# Patient Record
Sex: Male | Born: 1965 | Race: Black or African American | Hispanic: No | Marital: Single | Smoking: Never smoker
Health system: Southern US, Community
[De-identification: ages and names within clinical notes are randomized; demographics above are authoritative.]

## PROBLEM LIST (undated history)

## (undated) ENCOUNTER — Emergency Department (HOSPITAL_BASED_OUTPATIENT_CLINIC_OR_DEPARTMENT_OTHER): Admission: EM | Payer: Medicaid Other

## (undated) DIAGNOSIS — C801 Malignant (primary) neoplasm, unspecified: Secondary | ICD-10-CM

## (undated) DIAGNOSIS — F209 Schizophrenia, unspecified: Secondary | ICD-10-CM

---

## 2015-01-04 ENCOUNTER — Emergency Department (HOSPITAL_COMMUNITY)
Admission: EM | Admit: 2015-01-04 | Discharge: 2015-01-04 | Disposition: A | Discharge status: Home / Self Care | Payer: Medicaid Other | Attending: Emergency Medicine | Admitting: Emergency Medicine

## 2015-01-04 ENCOUNTER — Encounter (HOSPITAL_COMMUNITY): Payer: Self-pay | Admitting: *Deleted

## 2015-01-04 DIAGNOSIS — Z0000 Encounter for general adult medical examination without abnormal findings: Secondary | ICD-10-CM | POA: Diagnosis present

## 2015-01-04 DIAGNOSIS — Z711 Person with feared health complaint in whom no diagnosis is made: Secondary | ICD-10-CM | POA: Insufficient documentation

## 2015-01-04 DIAGNOSIS — F209 Schizophrenia, unspecified: Secondary | ICD-10-CM | POA: Insufficient documentation

## 2015-01-04 DIAGNOSIS — Z79899 Other long term (current) drug therapy: Secondary | ICD-10-CM | POA: Insufficient documentation

## 2015-01-04 NOTE — ED Notes (Signed)
The pot states he wants a physical no other info given slow walking talking  Difficult to understand

## 2015-01-04 NOTE — Discharge Instructions (Signed)
Please follow up with your doctor for further evaluation and management of your health and refill your medication   Emergency Department Resource Guide 1) Find a Doctor and Pay Out of Pocket Although you won't have to find out who is covered by your insurance plan, it is a good idea to ask around and get recommendations. You will then need to call the office and see if the doctor you have chosen will accept you as a new patient and what types of options they offer for patients who are self-pay. Some doctors offer discounts or will set up payment plans for their patients who do not have insurance, but you will need to ask so you aren't surprised when you get to your appointment.  2) Contact Your Local Health Department Not all health departments have doctors that can see patients for sick visits, but many do, so it is worth a call to see if yours does. If you don't know where your local health department is, you can check in your phone book. The CDC also has a tool to help you locate your state's health department, and many state websites also have listings of all of their local health departments.  3) Find a [facility] If your illness is not likely to be very severe or complicated, you may want to try a walk in clinic. These are popping up all over the country in pharmacies, drugstores, and shopping centers. They're usually staffed by nurse practitioners or physician assistants that have been trained to treat common illnesses and complaints. They're usually fairly quick and inexpensive. However, if you have serious medical issues or chronic medical problems, these are probably not your best option.  No Primary Care Doctor: - Call Health Connect at  [phone] - they can help you locate a primary care doctor that  accepts your insurance, provides certain services, etc. - Physician Referral Service- [phone]  Chronic Pain Problems: Organization         Address  Phone   Notes  [facility]  [phone] Patients need to be referred by their primary care doctor.   Medication Assistance: Organization         Address  Phone   Notes  [facility] [address] [place], [place] [address] [phone] --Must be a resident of [facility] -- Must have NO insurance coverage whatsoever (no Medicaid/ Medicare, etc.) -- The pt. MUST have a primary care doctor that directs their care regularly and follows them in the community   MedAssist  [phone]   [employer]  [phone]    Agencies that provide inexpensive medical care: Organization         Address  Phone   Notes  [place]  [phone]   [name]    [phone]   [facility] [place], [place] [address] [phone]   [place] [address] [place] [phone]   Planned Parenthood    [phone]   [facility]    [phone]   [place] and [place] [address], [place] Phone:  [phone], Fax:  [phone] Hours of Operation:  9 am - 6 pm, M-F.  Also accepts Medicaid/Medicare and self-pay.  [facility] for [place] [address], [address],  Phone: [phone], Fax: [phone]. Hours  of Operation:  8:30 am - 5:30 pm, M-F.  Also accepts Medicaid and self-pay.  [facility] [place] [address], [place] Phone: [phone]   [place], [place], [place] [phone], Ext. [phone] Mondays & Thursdays: 7-9 AM.  First 15 patients are seen on a first come, first serve basis.    Ewa Gentry Providers:  Organization         Address  Phone   Notes  [facility] [address], [place] [phone] Also accepts self-pay patients.  [facility] [address], [place]  [phone]   [place], [address], [place] [phone]   [facility] [address], [place] [phone]   Lucianne Lei [address], [place]   [phone] Only accepts [place] Access [place] patients after they have their name applied to their card.   Self-Pay (no insurance) in [facility]:  Organization         Address  Phone   Notes  Sickle Cell Patients, [facility] [place] [phone]   [facility] [place] [phone]   [name] [place]  [place], [place], [place] [phone]   [facility]/Dr. Osei-Bonsu  [address], [place] or [address], [place] [phone] Phone number for both [place] and [place] locations is the same.  [facility] and [facility] [address] [place] [phone]   [facility] [address] [place] or [address] [phone]   [facility] [address], [place] [phone], phone; [phone], fax Sees patients 1st and 3rd Saturday of every month.  Must not qualify for public or private insurance (i.e. Medicaid, Medicare, [place] Health Choice, Veterans' Benefits)  Household income should be no more than 200% of the poverty level The clinic cannot treat you if you are pregnant or think you are pregnant  Sexually transmitted diseases are not treated at the clinic.    Dental Care: Organization         Address  Phone  Notes  [facility] Department of [facility] [phone] Accepts children up to age 25 who are enrolled in [place] or [place]; pregnant women with a Medicaid card; and children who have applied for Medicaid  or [place] Health Choice, but were declined, whose parents can pay a reduced fee at time of service.  [facility] Department of [facility]  [address], [place] [phone] Accepts children up to age 71 who are enrolled in [place] or [place]; pregnant women with a Medicaid card; and children who have applied for Medicaid or [place] Health Choice, but were declined, whose parents can pay a reduced fee at time of service.  [place] Adult Dental Access PROGRAM  [place] [phone] Patients are seen by appointment only. Walk-ins are not accepted. [place] will see patients 52 years of age and older. Monday - Tuesday (8am-5pm) Most Wednesdays (8:30-5pm) $30 per visit, cash only  [facility] Adult Dental Access PROGRAM  [address], [place] [phone] Patients are  seen by appointment only. Walk-ins are not accepted. [place] will see patients 30 years of age and older. One Wednesday Evening (Monthly: Volunteer Based).  $30 per visit, cash only  [place]  [phone] for adults; Children under age 61, call Graduate Pediatric Dentistry at [phone]. Children aged 81-14, please call [phone] to request a pediatric application.  Dental services are provided in all areas of dental care including fillings, crowns and bridges, complete and partial dentures, implants, gum treatment, root canals, and extractions. Preventive care is also provided. Treatment is provided to both adults and children. Patients are selected via a lottery and there is often a waiting list.   [facility] [address] [name]  [phone] [email or web address]   [facility] [address] [place], [place] [phone], Ext. [phone] Second and Fourth Thursday of each month, opens at 6:30 AM; Clinic ends at 9 AM.  Patients are seen on a first-come first-served basis, and a limited number are seen  during each clinic.   [facility]  [address] Hillard Danker [place], [place] [phone]   Eligibility Requirements You must have lived in [place], [place], or [place] counties for at least the last three months.   You cannot be eligible for state or federal sponsored [employer], including [employer], [place], or [employer].   You generally cannot be eligible for healthcare insurance through your employer.    How to apply: Eligibility screenings are held every Tuesday and Wednesday afternoon from 1:00 pm until 4:00 pm. You do not need an appointment for the interview!  [facility] [address] [place], [place]   [place]  [place] Department  [place]  [phone]    Behavioral Health Resources in the Community: Intensive Outpatient Programs Organization         Address  Phone  Notes  [place]. [address], [place], [place] [phone]   [facility] [address], [place], [place]   ADS: Alcohol & Drug Svcs [address] [place], [place]   [place] [address]  [place], [place] or [phone]   Substance Abuse Resources Organization         Address  Phone  Notes  Alcohol and Drug Services  [phone]   [place]  [phone]   [place]   Chinita Pester  [phone]   Residential & Outpatient Substance Abuse Program  [phone]   Psychological Services Organization         Address  Phone  Notes  [facility]  [place]  [phone]   [place] [address] [place] or [phone]    Mobile Crisis Teams Organization         Address  Phone  Notes  [facility],  Mobile Crisis Care Unit  [phone]   [facility]  [address] [place], [place]   [place] [address], [place] [phone]    Self-Help/Support Groups Organization         Address  Phone             Notes  [place]. of  - variety of support groups  [place] Call for  more information  Narcotics Anonymous (NA), [facility] [address], [employer] [place]  2 meetings at this location   Residential Facilities manager         Address  Phone  Notes  [facility] [place],    [place]  [phone]   [facility]  [address] [place] [phone], [place], [place]   Irena [place], [place] [phone] Admissions: 8am-3pm M-F  Incentives Substance [place] [address],    [place], [place] [phone]   [facility] [address] [place], [place], [place]   [facility] [address],  [place], [place]   [facility] Programs - Intensive Outpatient [address] Kristeen [address], [place], [place]   [facility] [place].,  [place], [place] [phone] or [phone]   [facility] (RTS) [address], [place], [place] Accepts Medicaid  [place] [address]  [place] [phone] Substance Abuse/Addiction Treatment   [facility] Organization         Address  Phone  Notes  [facility]  [phone]   Domenic Schwab, PhD [address] Arlis Porta [place], [place]   [phone] or [phone]   [place], [place] [phone]   [facility] [address] [place], [place] [phone] Insurance/Medicaid/sponsorship through [facility] and Families [address],  Ste [place]                                    [place], [place] [phone] [place] [address], [place] [phone]    Dr. Adele Schilder  [phone]   [facility] Dept. 1) [address], [place] 2) [place] 3)  [place] [address], [place] [phone]  [phone]   [place] [phone] or [phone] (After Hours)

## 2015-01-04 NOTE — ED Provider Notes (Signed)
CSN: [id number]     Arrival date & time 01/04/15  1435 History   This chart was scribed for Matthew Moras, PA-C working with Orlie Dakin, MD by Randa Evens, ED Scribe. This patient was seen in room TR05C/TR05C and the patient's care was started at [facility] PM.      Chief Complaint  Patient presents with  . he weants a physical    The history is provided by the patient. No language interpreter was used.   HPI Comments: Matthew [name] is a 49 y.o. male who presents to the Emergency Department for physical exam. Pt doesn't report an complaints today. Pt does report PMHx of schizophrenia, he states that he was previously prescribed risperdal. He states that he hasn't been complaint with psychiatric medications for several months. Pt is requesting risperdal refill. Pt denies any changes in mental status. Pt denies fever, SI, HI, depression, hallucinations, CP, SOB, HA, abdominal pain, n/v/d, urinary changes, back pain or vision changes. Pt lives at home by himself. Denies self medicating with alcohol or drugs.    History reviewed. No pertinent past medical history. History reviewed. No pertinent past surgical history. No family history on file. History  Substance Use Topics  . Smoking status: Never Smoker   . Smokeless tobacco: Not on file  . Alcohol Use: No    Review of Systems  Constitutional: Negative for fever.  Respiratory: Negative for shortness of breath.   Cardiovascular: Negative for chest pain.  Gastrointestinal: Negative for nausea, vomiting, abdominal pain and diarrhea.  Genitourinary: Negative.   Musculoskeletal: Negative for back pain.  Neurological: Negative for headaches.  Psychiatric/Behavioral: Negative for suicidal ideas, hallucinations, sleep disturbance and dysphoric mood.     Allergies  Review of patient's allergies indicates no known allergies.  Home Medications   Prior to Admission medications   Not on File   BP 112/82 mmHg  Pulse 91  Temp(Src) 98.2 F  (36.8 C)  Resp 18  Ht '5\' 9"'$  (1.753 m)  Wt 121 lb (54.885 kg)  BMI 17.86 kg/m2  SpO2 99%   Physical Exam  Constitutional: He is oriented to person, place, and time. He appears well-developed and well-nourished. No distress.  HENT:  Head: Normocephalic and atraumatic.  Mouth/Throat: Oropharynx is clear and moist.  Eyes: Conjunctivae and EOM are normal. Pupils are equal, round, and reactive to light.  Neck: Neck supple. No tracheal deviation present.  Cardiovascular: Normal rate, regular rhythm and normal heart sounds.   Pulmonary/Chest: Effort normal and breath sounds normal. No respiratory distress. He has no wheezes. He has no rales.  Abdominal: Soft.  Musculoskeletal: Normal range of motion.  Neurological: He is alert and oriented to person, place, and time. GCS eye subscore is 4. GCS verbal subscore is 5. GCS motor subscore is 6.  Skin: Skin is warm and dry.  Psychiatric: His affect is blunt. His speech is delayed. He is slowed. Thought content is not paranoid. Cognition and memory are normal. He expresses no homicidal and no suicidal ideation.  Nursing note and vitals reviewed.   ED Course  Procedures (including critical care time) DIAGNOSTIC STUDIES: Oxygen Saturation is 99% on RA, normal by my interpretation.    COORDINATION OF CARE: 3:43 PM-Discussed treatment plan with pt at bedside and pt agreed to plan.   3:47 PM Worried well, no acute emergent condition identified. Appears to be baseline with his psychiatric status.  No physical pain. NO SI/HI/Hallucination. Recommend f/u with his pcp for psychiatric medication refill.  Return precaution discussed.  Labs Review Labs Reviewed - No data to display  Imaging Review No results found.   EKG Interpretation None      MDM   Final diagnoses:  Worried well   BP 112/82 mmHg  Pulse 91  Temp(Src) 98.2 F (36.8 C)  Resp 18  Ht '5\' 9"'$  (1.753 m)  Wt 121 lb (54.885 kg)  BMI 17.86 kg/m2  SpO2 99%   I personally  performed the services described in this documentation, which was scribed in my presence. The recorded information has been reviewed and is accurate.       Matthew Moras, PA-C 01/04/15 [place], PA-C 01/04/15 [place], MD 01/04/15 [phone]

## 2016-03-26 ENCOUNTER — Emergency Department (HOSPITAL_COMMUNITY)
Admission: EM | Admit: 2016-03-26 | Discharge: 2016-03-26 | Disposition: A | Discharge status: Home / Self Care | Payer: Medicaid Other | Attending: Emergency Medicine | Admitting: Emergency Medicine

## 2016-03-26 ENCOUNTER — Encounter (HOSPITAL_COMMUNITY): Payer: Self-pay | Admitting: Emergency Medicine

## 2016-03-26 DIAGNOSIS — R066 Hiccough: Secondary | ICD-10-CM

## 2016-03-26 MED ORDER — CHLORPROMAZINE HCL 25 MG PO TABS
25.0000 mg | ORAL_TABLET | Freq: Once | ORAL | Status: AC
  Administered 2016-03-26: 25 mg via ORAL
  Filled 2016-03-26: qty 1

## 2016-03-26 NOTE — ED Triage Notes (Signed)
Pt in EMS, reports hiccups. Wants his hiccups fixed.

## 2016-03-26 NOTE — ED Provider Notes (Signed)
[facility] DEPT Provider Note   CSN: [id number] Arrival date & time: 03/26/16  7829     History   Chief Complaint Chief Complaint  Patient presents with  . Hiccups    HPI Matthew [name] is a 50 y.o. male.  HPI   50 year old male presents today with complaints of hiccups. Patient reports that last night he started developing hiccups that are persistent, and will not go away. Patient has not tried any medications at home, reports that he had similar symptoms in the past. Patient is a poor historian. He does not take any medications, denies any drug or alcohol use.   History reviewed. No pertinent past medical history.  There are no active problems to display for this patient.   History reviewed. No pertinent surgical history.     Home Medications    Prior to Admission medications   Not on File    Family History No family history on file.  Social History Social History  Substance Use Topics  . Smoking status: Never Smoker  . Smokeless tobacco: Not on file  . Alcohol use No     Allergies   Review of patient's allergies indicates no known allergies.   Review of Systems Review of Systems  All other systems reviewed and are negative.   Physical Exam Updated Vital Signs BP 137/84 (BP Location: Right Arm)   Pulse 74   Temp 98.2 F (36.8 C) (Oral)   Resp 16   SpO2 99%   Physical Exam  Constitutional: He is oriented to person, place, and time. He appears well-developed and well-nourished.  HENT:  Head: Normocephalic and atraumatic.  Mouth/Throat: Uvula is midline and oropharynx is clear and moist. No oropharyngeal exudate, posterior oropharyngeal edema, posterior oropharyngeal erythema or tonsillar abscesses.  Eyes: Conjunctivae are normal. Pupils are equal, round, and reactive to light. Right eye exhibits no discharge. Left eye exhibits no discharge. No scleral icterus.  Neck: Normal range of motion. No JVD present. No tracheal deviation present.    Cardiovascular: Normal rate, regular rhythm and normal heart sounds.   Pulmonary/Chest: Effort normal and breath sounds normal. No stridor. No respiratory distress. He has no wheezes. He has no rales. He exhibits no tenderness.  Abdominal: Soft. He exhibits no distension. There is no tenderness.  Neurological: He is alert and oriented to person, place, and time. Coordination normal.  Psychiatric: He has a normal mood and affect. His behavior is normal. Judgment and thought content normal.  Nursing note and vitals reviewed.    ED Treatments / Results  Labs (all labs ordered are listed, but only abnormal results are displayed) Labs Reviewed - No data to display  EKG  EKG Interpretation None       Radiology No results found.  Procedures Procedures (including critical care time)  Medications Ordered in ED Medications  chlorproMAZINE (THORAZINE) tablet 25 mg (25 mg Oral Given 03/26/16 0753)     Initial Impression / Assessment and Plan / ED Course  I have reviewed the triage vital signs and the nursing notes.  Pertinent labs & imaging results that were available during my care of the patient were reviewed by me and considered in my medical decision making (see chart for details).  Clinical Course    Final Clinical Impressions(s) / ED Diagnoses   Final diagnoses:  Hiccups   Labs:  Imaging:  Consults:  Therapeutics:Thorazine  Discharge Meds:   Assessment/Plan:  49 year old male presents today with hiccups. Patient denies any chest pain, shortness of breath,  abdominal pain, or any other concerning signs or symptoms. Patient was given Cold water to sip and gargle which resolved his symptoms temporarily. After approximately 15 minutes symptoms return, they were not relieved with cold water again. He was given Thorazine here in the ED, no resolution of symptoms. Patient has no significant cardiac history, no other complaints other than the hiccups, no need for further  evaluation and management here at this time. Patient will be instructed to continue sipping cold water, follow up with primary care if symptoms persist, return to emergency room immediately if they worsen. He verbalizes understanding and agreement to today's plan had no further questions or concerns at time of discharge. Digits mother was at bedside who also verbalized understanding and agreement today's plan.     New Prescriptions There are no discharge medications for this patient.    Okey Regal, PA-C 03/26/16 [place], MD 03/27/16 [phone]

## 2016-03-26 NOTE — Discharge Instructions (Signed)
Please continue sipping cold water as needed for hiccups, follow-up with primary care for reevaluation of symptoms persist, return to the emergency room if they worsen.

## 2016-03-29 ENCOUNTER — Encounter (HOSPITAL_COMMUNITY): Payer: Self-pay

## 2016-03-29 ENCOUNTER — Emergency Department (HOSPITAL_COMMUNITY): Payer: Medicaid Other

## 2016-03-29 ENCOUNTER — Emergency Department (HOSPITAL_COMMUNITY)
Admission: EM | Admit: 2016-03-29 | Discharge: 2016-03-29 | Disposition: A | Discharge status: Home / Self Care | Payer: Medicaid Other | Attending: Emergency Medicine | Admitting: Emergency Medicine

## 2016-03-29 DIAGNOSIS — R066 Hiccough: Secondary | ICD-10-CM

## 2016-03-29 DIAGNOSIS — R079 Chest pain, unspecified: Secondary | ICD-10-CM | POA: Insufficient documentation

## 2016-03-29 DIAGNOSIS — R112 Nausea with vomiting, unspecified: Secondary | ICD-10-CM | POA: Diagnosis not present

## 2016-03-29 LAB — CBC WITH DIFFERENTIAL/PLATELET
BASOS ABS: 0 10*3/uL (ref 0.0–0.1)
BASOS PCT: 0 %
Eosinophils Absolute: 0 10*3/uL (ref 0.0–0.7)
Eosinophils Relative: 0 %
HEMATOCRIT: 34.4 % — AB (ref 39.0–52.0)
HEMOGLOBIN: 11.7 g/dL — AB (ref 13.0–17.0)
LYMPHS ABS: 1.3 10*3/uL (ref 0.7–4.0)
LYMPHS PCT: 17 %
MCH: 29.5 pg (ref 26.0–34.0)
MCHC: 34 g/dL (ref 30.0–36.0)
MCV: 86.6 fL (ref 78.0–100.0)
MONO ABS: 0.8 10*3/uL (ref 0.1–1.0)
Monocytes Relative: 10 %
NEUTROS ABS: 5.6 10*3/uL (ref 1.7–7.7)
NEUTROS PCT: 73 %
Platelets: 307 10*3/uL (ref 150–400)
RBC: 3.97 MIL/uL — AB (ref 4.22–5.81)
RDW: 12.9 % (ref 11.5–15.5)
WBC: 7.7 10*3/uL (ref 4.0–10.5)

## 2016-03-29 LAB — COMPREHENSIVE METABOLIC PANEL
ALBUMIN: 4.1 g/dL (ref 3.5–5.0)
ALK PHOS: 63 U/L (ref 38–126)
ALT: 25 U/L (ref 17–63)
ANION GAP: 10 (ref 5–15)
AST: 16 U/L (ref 15–41)
BUN: 10 mg/dL (ref 6–20)
CHLORIDE: 102 mmol/L (ref 101–111)
CO2: 25 mmol/L (ref 22–32)
Calcium: 9.1 mg/dL (ref 8.9–10.3)
Creatinine, Ser: 0.88 mg/dL (ref 0.61–1.24)
GFR calc Af Amer: >60 mL/min (ref >60)
GFR calc non Af Amer: >60 mL/min (ref >60)
GLUCOSE: 109 mg/dL — AB (ref 65–99)
POTASSIUM: 3.7 mmol/L (ref 3.5–5.1)
SODIUM: 137 mmol/L (ref 135–145)
Total Bilirubin: 1.2 mg/dL (ref 0.3–1.2)
Total Protein: 6.9 g/dL (ref 6.5–8.1)

## 2016-03-29 LAB — LIPASE, BLOOD: Lipase: 17 U/L (ref 11–51)

## 2016-03-29 LAB — I-STAT TROPONIN, ED: Troponin i, poc: 0 ng/mL (ref 0.00–0.08)

## 2016-03-29 MED ORDER — KETOROLAC TROMETHAMINE 60 MG/2ML IM SOLN
60.0000 mg | Freq: Once | INTRAMUSCULAR | Status: AC
  Administered 2016-03-29: 60 mg via INTRAMUSCULAR
  Filled 2016-03-29: qty 2

## 2016-03-29 MED ORDER — CHLORPROMAZINE HCL 25 MG PO TABS
25.0000 mg | ORAL_TABLET | Freq: Once | ORAL | Status: AC
  Administered 2016-03-29: 25 mg via ORAL
  Filled 2016-03-29: qty 1

## 2016-03-29 MED ORDER — CHLORPROMAZINE HCL 25 MG PO TABS: 25.0000 mg | ORAL_TABLET | Freq: Three times a day (TID) | ORAL | 0 refills | Status: DC | PRN

## 2016-03-29 MED ORDER — GI COCKTAIL ~~LOC~~
30.0000 mL | Freq: Once | ORAL | Status: AC
  Administered 2016-03-29: 30 mL via ORAL
  Filled 2016-03-29: qty 30

## 2016-03-29 MED ORDER — NAPROXEN 500 MG PO TABS: 500.0000 mg | ORAL_TABLET | Freq: Two times a day (BID) | ORAL | 0 refills | Status: DC

## 2016-03-29 NOTE — ED Provider Notes (Signed)
[facility] DEPT Provider Note   CSN: [id number] Arrival date & time: 03/29/16  1056     History   Chief Complaint Chief Complaint  Patient presents with  . Chest Pain    HPI Matthew [name] is a 50 y.o. male.  Patient with history of schizophrenia on depot IM Invega -- presents with complaint of vomiting, mid chest pain, heartburn, and hiccups over the past 1 week. Mother at bedside and assists with history. Symptoms seem to come and go. Patient was seen in emergency department several days ago and treated with cold water gargles and 1 dose of Thorazine. He seemed to help, however patient has been unable to control his symptoms at home. He has a poor appetite due to vomiting. Chest pain is worse with hiccuping. No shortness of breath or lightheadedness. No fevers or diarrhea. No abdominal pain. No history of hypertension, high cholesterol, smoking, diabetes. Denies alcohol use. Chest pain has been present for greater than 24 hours. The onset of this condition was acute. The course is constant. Aggravating factors: none. Alleviating factors: none.        History reviewed. No pertinent past medical history.  There are no active problems to display for this patient.   History reviewed. No pertinent surgical history.     Home Medications    Prior to Admission medications   Not on File    Family History No family history on file.  Social History Social History  Substance Use Topics  . Smoking status: Never Smoker  . Smokeless tobacco: Not on file  . Alcohol use No     Allergies   Review of patient's allergies indicates no known allergies.   Review of Systems Review of Systems  Constitutional: Negative for diaphoresis and fever.  Eyes: Negative for redness.  Respiratory: Negative for cough and shortness of breath.   Cardiovascular: Positive for chest pain. Negative for palpitations and leg swelling.  Gastrointestinal: Positive for nausea and vomiting.  Negative for abdominal pain.       + hiccups  Genitourinary: Negative for dysuria.  Musculoskeletal: Negative for back pain and neck pain.  Skin: Negative for rash.  Neurological: Negative for syncope and light-headedness.  Psychiatric/Behavioral: The patient is not nervous/anxious.      Physical Exam Updated Vital Signs BP 104/77 (BP Location: Right Arm)   Pulse 92   Temp 97.5 F (36.4 C) (Oral)   Resp 18   Ht '5\' 9"'$  (1.753 m)   Wt 63.5 kg   SpO2 99%   BMI 20.67 kg/m   Physical Exam  Constitutional: He appears well-developed and well-nourished.  HENT:  Head: Normocephalic and atraumatic.  Mouth/Throat: Mucous membranes are normal. Mucous membranes are not dry.  No apparent thyromegaly.   Eyes: Conjunctivae are normal.  Neck: Trachea normal and normal range of motion. Neck supple. Normal carotid pulses and no JVD present. No muscular tenderness present. Carotid bruit is not present. No tracheal deviation present.  Cardiovascular: Normal rate, regular rhythm, S1 normal, S2 normal, normal heart sounds and intact distal pulses.  Exam reveals no distant heart sounds and no decreased pulses.   No murmur heard. Pulmonary/Chest: Effort normal and breath sounds normal. No respiratory distress. He has no wheezes. He exhibits no tenderness.  Abdominal: Soft. Normal aorta and bowel sounds are normal. There is no tenderness. There is no rebound and no guarding.  No hiccups during exam.   Musculoskeletal: He exhibits no edema.  Neurological: He is alert.  Skin: Skin is warm  and dry. He is not diaphoretic. No cyanosis. No pallor.  Psychiatric: He has a normal mood and affect.  Nursing note and vitals reviewed.    ED Treatments / Results  Labs (all labs ordered are listed, but only abnormal results are displayed) Labs Reviewed  COMPREHENSIVE METABOLIC PANEL - Abnormal; Notable for the following:       Result Value   Glucose, Bld 109 (*)    All other components within normal limits    CBC WITH DIFFERENTIAL/PLATELET - Abnormal; Notable for the following:    RBC 3.97 (*)    Hemoglobin 11.7 (*)    HCT 34.4 (*)    All other components within normal limits  LIPASE, BLOOD  I-STAT TROPOININ, ED    EKG  EKG Interpretation None       Radiology Dg Chest 2 View  Result Date: 03/29/2016 CLINICAL DATA:  Midsternal chest pain with cough and ache ups for 3 days. EXAM: CHEST  2 VIEW COMPARISON:  None. FINDINGS: No focal airspace consolidation. No pneumothorax or pulmonary edema. Tiny bilateral pleural effusions are evident. The cardiopericardial silhouette is within normal limits for size. The visualized bony structures of the thorax are intact. IMPRESSION: Tiny bilateral pleural effusions. Electronically Signed   By: Misty Stanley M.D.   On: 03/29/2016 14:19    Procedures Procedures (including critical care time)  Medications Ordered in ED Medications  gi cocktail (Maalox,Lidocaine,Donnatal) (30 mLs Oral Given 03/29/16 1347)  chlorproMAZINE (THORAZINE) tablet 25 mg (25 mg Oral Given 03/29/16 1502)  ketorolac (TORADOL) injection 60 mg (60 mg Intramuscular Given 03/29/16 1502)     Initial Impression / Assessment and Plan / ED Course  I have reviewed the triage vital signs and the nursing notes.  Pertinent labs & imaging results that were available during my care of the patient were reviewed by me and considered in my medical decision making (see chart for details).  Clinical Course   1:33 PM Patient seen and examined. Work-up initiated for persistent hiccups.   Vital signs reviewed and are as follows: BP 104/77 (BP Location: Right Arm)   Pulse 92   Temp 97.5 F (36.4 C) (Oral)   Resp 18   Ht '5\' 9"'$  (1.753 m)   Wt 63.5 kg   SpO2 99%   BMI 20.67 kg/m   3:51 PM Patient and mother informed of results. No indications for admission. GI cocktail was not effective.   Will give dose of PO Thorazine here with IM toradol for chest pain.   Will discharge with Thorazine  and naproxen.   Patient urged to return with worsening symptoms or other concerns. Encouraged PCP follow-up. Patient has appointment to establish PCP upcoming. Patient verbalized understanding and agrees with plan.   Final Clinical Impressions(s) / ED Diagnoses   Final diagnoses:  Chronic hiccoughs  Non-intractable vomiting with nausea, vomiting of unspecified type   Patient with persistent hiccups for approximately 4 days. He has CP, wrkup for ischemia is negative, feel this is likely related to chest wall musculoskeletal pain from frequent hiccups. He also has decreased appetite and vomiting, which is likely more regurgitation, secondary to hiccups. Patient appears well-hydrated. He is tolerating orals. Will discharge to home with oral medication to help control symptoms. PCP follow-up as above. Encouraged return with worsening or changing symptoms. Patient and mother verbalized understanding and agreed with plan.  New Prescriptions New Prescriptions   CHLORPROMAZINE (THORAZINE) 25 MG TABLET    Take 1 tablet (25 mg total) by mouth 3 (  three) times daily as needed for hiccoughs.   NAPROXEN (NAPROSYN) 500 MG TABLET    Take 1 tablet (500 mg total) by mouth 2 (two) times daily.     Carlisle Cater, PA-C 03/29/16 1100    Merrily Pew, MD 03/30/16 1045

## 2016-03-29 NOTE — Discharge Instructions (Signed)
Please read and follow all provided instructions.  Your diagnoses today include:  1. Chronic hiccoughs   2. Non-intractable vomiting with nausea, vomiting of unspecified type     Tests performed today include:  An EKG of your heart  A chest x-ray  Cardiac enzymes - a blood test for heart muscle damage  Blood counts and electrolytes  Vital signs. See below for your results today.   Medications prescribed:   Thorazine - medication to help control your hiccups.    Naproxen - anti-inflammatory pain medication  Do not exceed '500mg'$  naproxen every 12 hours, take with food  You have been prescribed an anti-inflammatory medication or NSAID. Take with food. Take smallest effective dose for the shortest duration needed for your pain. Stop taking if you experience stomach pain or vomiting.    Take any prescribed medications only as directed.  Follow-up instructions: Please follow-up with your primary care provider as soon as you can for further evaluation of your symptoms.   Return instructions:  SEEK IMMEDIATE MEDICAL ATTENTION IF:  You have severe chest pain, especially if the pain is crushing or pressure-like and spreads to the arms, back, neck, or jaw, or if you have sweating, nausea (feeling sick to your stomach), or shortness of breath. THIS IS AN EMERGENCY. Don't wait to see if the pain will go away. Get medical help at once. Call 911 or [phone] (operator). DO NOT drive yourself to the hospital.   Your chest pain gets worse and does not go away with rest.   You have an attack of chest pain lasting longer than usual, despite rest and treatment with the medications your caregiver has prescribed.   You wake from sleep with chest pain or shortness of breath.  You feel dizzy or faint.  You have chest pain not typical of your usual pain for which you originally saw your caregiver.   You have any other emergent concerns regarding your health.  Additional Information: Chest pain  comes from many different causes. Your caregiver has diagnosed you as having chest pain that is not specific for one problem, but does not require admission.  You are at low risk for an acute heart condition or other serious illness.   Your vital signs today were: BP 111/79    Pulse 78    Temp 97.5 F (36.4 C) (Oral)    Resp 13    Ht '5\' 9"'$  (1.753 m)    Wt 63.5 kg    SpO2 100%    BMI 20.67 kg/m  If your blood pressure (BP) was elevated above 135/85 this visit, please have this repeated by your doctor within one month. --------------

## 2016-03-29 NOTE — ED Triage Notes (Signed)
Pt having hiccups for more than 48 hours. Pt states "that when he hiccups he has chest pain and it feels like it is squeezing his chest."

## 2016-03-29 NOTE — ED Notes (Signed)
Patient transported to X-ray 

## 2017-06-06 ENCOUNTER — Emergency Department (HOSPITAL_COMMUNITY)
Admission: EM | Admit: 2017-06-06 | Discharge: 2017-06-06 | Disposition: A | Discharge status: Home / Self Care | Payer: Medicaid Other | Attending: Emergency Medicine | Admitting: Emergency Medicine

## 2017-06-06 ENCOUNTER — Encounter (HOSPITAL_COMMUNITY): Payer: Self-pay

## 2017-06-06 DIAGNOSIS — Z7982 Long term (current) use of aspirin: Secondary | ICD-10-CM | POA: Insufficient documentation

## 2017-06-06 DIAGNOSIS — Z79899 Other long term (current) drug therapy: Secondary | ICD-10-CM | POA: Diagnosis not present

## 2017-06-06 DIAGNOSIS — R112 Nausea with vomiting, unspecified: Secondary | ICD-10-CM | POA: Diagnosis present

## 2017-06-06 LAB — COMPREHENSIVE METABOLIC PANEL
ALBUMIN: 3.7 g/dL (ref 3.5–5.0)
ALK PHOS: 72 U/L (ref 38–126)
ALT: 11 U/L — AB (ref 17–63)
AST: 19 U/L (ref 15–41)
Anion gap: 8 (ref 5–15)
BUN: 8 mg/dL (ref 6–20)
CALCIUM: 9 mg/dL (ref 8.9–10.3)
CO2: 23 mmol/L (ref 22–32)
CREATININE: 1 mg/dL (ref 0.61–1.24)
Chloride: 106 mmol/L (ref 101–111)
GFR calc Af Amer: >60 mL/min (ref >60)
GFR calc non Af Amer: >60 mL/min (ref >60)
GLUCOSE: 117 mg/dL — AB (ref 65–99)
POTASSIUM: 3.6 mmol/L (ref 3.5–5.1)
Sodium: 137 mmol/L (ref 135–145)
Total Bilirubin: 0.8 mg/dL (ref 0.3–1.2)
Total Protein: 6.7 g/dL (ref 6.5–8.1)

## 2017-06-06 LAB — URINALYSIS, ROUTINE W REFLEX MICROSCOPIC
Glucose, UA: NEGATIVE mg/dL
Hgb urine dipstick: NEGATIVE
Ketones, ur: NEGATIVE mg/dL
LEUKOCYTES UA: NEGATIVE
Nitrite: NEGATIVE
PROTEIN: NEGATIVE mg/dL
SPECIFIC GRAVITY, URINE: 1.023 (ref 1.005–1.030)
pH: 5 (ref 5.0–8.0)

## 2017-06-06 LAB — I-STAT CG4 LACTIC ACID, ED
LACTIC ACID, VENOUS: 2.03 mmol/L — AB (ref 0.5–1.9)
LACTIC ACID, VENOUS: 2.56 mmol/L — AB (ref 0.5–1.9)

## 2017-06-06 LAB — CBC
HCT: 37.9 % — ABNORMAL LOW (ref 39.0–52.0)
HEMOGLOBIN: 12.3 g/dL — AB (ref 13.0–17.0)
MCH: 28.1 pg (ref 26.0–34.0)
MCHC: 32.5 g/dL (ref 30.0–36.0)
MCV: 86.5 fL (ref 78.0–100.0)
PLATELETS: 387 10*3/uL (ref 150–400)
RBC: 4.38 MIL/uL (ref 4.22–5.81)
RDW: 12.8 % (ref 11.5–15.5)
WBC: 6 10*3/uL (ref 4.0–10.5)

## 2017-06-06 LAB — LIPASE, BLOOD: Lipase: 22 U/L (ref 11–51)

## 2017-06-06 MED ORDER — SODIUM CHLORIDE 0.9 % IV BOLUS (SEPSIS)
1000.0000 mL | Freq: Once | INTRAVENOUS | Status: AC
  Administered 2017-06-06: 1000 mL via INTRAVENOUS

## 2017-06-06 MED ORDER — FAMOTIDINE 20 MG PO TABS: 20.0000 mg | ORAL_TABLET | Freq: Two times a day (BID) | ORAL | 0 refills | Status: DC

## 2017-06-06 MED ORDER — ONDANSETRON HCL 4 MG PO TABS: 4.0000 mg | ORAL_TABLET | Freq: Three times a day (TID) | ORAL | 0 refills | Status: DC | PRN

## 2017-06-06 NOTE — Discharge Instructions (Signed)
Your lab tests are ok today with no suggestion of your symptoms being related to an emergent or surgical problem.  I suspect you may have some gastritis or acid reflux which may be triggering these episodes.  Use the medicines prescribed if needed for continued symptoms.

## 2017-06-06 NOTE — ED Provider Notes (Signed)
[facility] EMERGENCY DEPARTMENT Provider Note   CSN: [id number] Arrival date & time: 06/06/17  1256     History   Chief Complaint Chief Complaint  Patient presents with  . Abdominal Pain  . Emesis    HPI Matthew [name] is a 51 y.o. male with a history of schizophrenia presenting with an approximate 3-week history of intermittent nausea and emesis.  He reports his symptoms occur at night, generally when supine while trying to sleep.  His last episode of vomiting occurred 2 nights ago.  He denies abdominal pain, diarrhea, dysuria, chest pain or shortness of breath.  He has had no recent illness such as URI type symptoms, fevers or chills.  He has had no recent medication changes.  He has had no medication for symptom relief.  Mother endorses he has had a good appetite, no complaints with eating or meals and no unexplained weight loss.   The history is provided by the patient and a parent.  Emesis   Pertinent negatives include no abdominal pain, no arthralgias, no chills, no fever and no headaches.    History reviewed. No pertinent past medical history.  There are no active problems to display for this patient.   History reviewed. No pertinent surgical history.     Home Medications    Prior to Admission medications   Medication Sig Start Date End Date Taking? Authorizing Provider  aspirin EC 81 MG tablet Take 81 mg daily as needed by mouth (pain/headache).   Yes [provider]  benztropine (COGENTIN) 1 MG tablet Take 1 mg at bedtime by mouth.   Yes [provider]  paliperidone (INVEGA SUSTENNA) 156 MG/ML SUSP injection Inject 156 mg every 30 (thirty) days into the muscle.   Yes [provider]  chlorproMAZINE (THORAZINE) 25 MG tablet Take 1 tablet (25 mg total) by mouth 3 (three) times daily as needed for hiccoughs. Patient not taking: Reported on 06/06/2017 03/29/16   Carlisle Cater, PA-C  famotidine (PEPCID) 20 MG tablet Take 1  tablet (20 mg total) 2 (two) times daily by mouth. 06/06/17   Evalee Jefferson, PA-C  naproxen (NAPROSYN) 500 MG tablet Take 1 tablet (500 mg total) by mouth 2 (two) times daily. Patient not taking: Reported on 06/06/2017 03/29/16   Carlisle Cater, PA-C  ondansetron (ZOFRAN) 4 MG tablet Take 1 tablet (4 mg total) every 8 (eight) hours as needed by mouth for nausea or vomiting. 06/06/17   Evalee Jefferson, PA-C    Family History No family history on file.  Social History Social History   Tobacco Use  . Smoking status: Never Smoker  Substance Use Topics  . Alcohol use: No  . Drug use: Not on file     Allergies   Patient has no known allergies.   Review of Systems Review of Systems  Constitutional: Negative for chills, diaphoresis and fever.  HENT: Negative for congestion and sore throat.   Eyes: Negative.   Respiratory: Negative for chest tightness and shortness of breath.   Cardiovascular: Negative for chest pain.  Gastrointestinal: Positive for nausea and vomiting. Negative for abdominal pain.  Genitourinary: Negative.   Musculoskeletal: Negative for arthralgias, joint swelling and neck pain.  Skin: Negative.  Negative for rash and wound.  Neurological: Negative for dizziness, weakness, light-headedness, numbness and headaches.  Psychiatric/Behavioral: Negative for agitation and sleep disturbance.     Physical Exam Updated Vital Signs BP 129/88   Pulse 96   Temp 99 F (37.2 C) (Oral)  Resp 16   SpO2 100%   Physical Exam  Constitutional: He appears well-developed and well-nourished.  HENT:  Head: Normocephalic and atraumatic.  Eyes: Conjunctivae are normal.  Neck: Normal range of motion.  Cardiovascular: Normal rate, regular rhythm, normal heart sounds and intact distal pulses.  Pulmonary/Chest: Effort normal and breath sounds normal. He has no wheezes.  Abdominal: Soft. Bowel sounds are normal. He exhibits no distension and no mass. There is no hepatosplenomegaly. There is  no tenderness. There is no rigidity, no guarding, no tenderness at McBurney's point and negative Murphy's sign.  Musculoskeletal: Normal range of motion.  Neurological: He is alert.  Skin: Skin is warm and dry.  Psychiatric:  Extremely flat affect (baseline per mother).  Nursing note and vitals reviewed.    ED Treatments / Results  Labs (all labs ordered are listed, but only abnormal results are displayed) Labs Reviewed  COMPREHENSIVE METABOLIC PANEL - Abnormal; Notable for the following components:      Result Value   Glucose, Bld 117 (*)    ALT 11 (*)    All other components within normal limits  CBC - Abnormal; Notable for the following components:   Hemoglobin 12.3 (*)    HCT 37.9 (*)    All other components within normal limits  URINALYSIS, ROUTINE W REFLEX MICROSCOPIC - Abnormal; Notable for the following components:   Color, Urine AMBER (*)    Bilirubin Urine SMALL (*)    All other components within normal limits  I-STAT CG4 LACTIC ACID, ED - Abnormal; Notable for the following components:   Lactic Acid, Venous 2.03 (*)    All other components within normal limits  I-STAT CG4 LACTIC ACID, ED - Abnormal; Notable for the following components:   Lactic Acid, Venous 2.56 (*)    All other components within normal limits  LIPASE, BLOOD    EKG  EKG Interpretation None       Radiology No results found.  Procedures Procedures (including critical care time)  Medications Ordered in ED Medications  sodium chloride 0.9 % bolus 1,000 mL (0 mLs Intravenous Stopped 06/06/17 1711)  sodium chloride 0.9 % bolus 1,000 mL (1,000 mLs Intravenous New Bag/Given 06/06/17 1712)     Initial Impression / Assessment and Plan / ED Course  I have reviewed the triage vital signs and the nursing notes.  Pertinent labs & imaging results that were available during my care of the patient were reviewed by me and considered in my medical decision making (see chart for details).      Patient was given IV fluids during ED visit.  He had no nausea or emesis or other complaint during his visit here.  Labs reviewed and are stable.  Patient with nocturnal only nausea with emesis, currently asymptomatic.  Patient possibly could have acid reflux and this was addressed by prescribing Pepcid.  He is also prescribed  Zofran for as needed use.  Advised recheck by his PCP if not improved over the next week, returning here for any new or worsening symptoms.  Patient was seen by Dr. Lacinda Axon during this ED visit.  Final Clinical Impressions(s) / ED Diagnoses   Final diagnoses:  Non-intractable vomiting with nausea, unspecified vomiting type    New Prescriptions This SmartLink is deprecated. Use AVSMEDLIST instead to display the medication list for a patient.   Evalee Jefferson, PA-C 06/06/17 Tressie Ellis, MD 06/08/17 [phone]

## 2017-06-06 NOTE — ED Triage Notes (Signed)
Patient complains of 3 weeks of intermittent emesis. denies abdominal pain, no diarrhea. Alert and oriented, lips appear cracked and dry

## 2017-06-06 NOTE — ED Notes (Signed)
Nurse currently drawing labs 

## 2018-10-18 ENCOUNTER — Encounter: Payer: Self-pay | Admitting: Gastroenterology

## 2020-04-01 ENCOUNTER — Emergency Department (HOSPITAL_COMMUNITY)
Admission: EM | Admit: 2020-04-01 | Discharge: 2020-04-01 | Disposition: A | Discharge status: Home / Self Care | Payer: Medicaid Other | Attending: Emergency Medicine | Admitting: Emergency Medicine

## 2020-04-01 ENCOUNTER — Other Ambulatory Visit: Payer: Self-pay

## 2020-04-01 ENCOUNTER — Emergency Department (HOSPITAL_COMMUNITY): Payer: Medicaid Other

## 2020-04-01 DIAGNOSIS — R1010 Upper abdominal pain, unspecified: Secondary | ICD-10-CM | POA: Diagnosis not present

## 2020-04-01 DIAGNOSIS — Z79899 Other long term (current) drug therapy: Secondary | ICD-10-CM | POA: Insufficient documentation

## 2020-04-01 DIAGNOSIS — Z7951 Long term (current) use of inhaled steroids: Secondary | ICD-10-CM | POA: Insufficient documentation

## 2020-04-01 DIAGNOSIS — K8020 Calculus of gallbladder without cholecystitis without obstruction: Secondary | ICD-10-CM

## 2020-04-01 LAB — COMPREHENSIVE METABOLIC PANEL
ALT: 9 U/L (ref 0–44)
AST: 13 U/L — ABNORMAL LOW (ref 15–41)
Albumin: 4.1 g/dL (ref 3.5–5.0)
Alkaline Phosphatase: 70 U/L (ref 38–126)
Anion gap: 12 (ref 5–15)
BUN: 8 mg/dL (ref 6–20)
CO2: 23 mmol/L (ref 22–32)
Calcium: 9.6 mg/dL (ref 8.9–10.3)
Chloride: 104 mmol/L (ref 98–111)
Creatinine, Ser: 1.06 mg/dL (ref 0.61–1.24)
GFR calc Af Amer: >60 mL/min (ref >60)
GFR calc non Af Amer: >60 mL/min (ref >60)
Glucose, Bld: 83 mg/dL (ref 70–99)
Potassium: 3.8 mmol/L (ref 3.5–5.1)
Sodium: 139 mmol/L (ref 135–145)
Total Bilirubin: 0.9 mg/dL (ref 0.3–1.2)
Total Protein: 7.1 g/dL (ref 6.5–8.1)

## 2020-04-01 LAB — URINALYSIS, ROUTINE W REFLEX MICROSCOPIC
Bilirubin Urine: NEGATIVE
Glucose, UA: NEGATIVE mg/dL
Hgb urine dipstick: NEGATIVE
Ketones, ur: 5 mg/dL — AB
Leukocytes,Ua: NEGATIVE
Nitrite: NEGATIVE
Protein, ur: NEGATIVE mg/dL
Specific Gravity, Urine: 1.009 (ref 1.005–1.030)
pH: 5 (ref 5.0–8.0)

## 2020-04-01 LAB — CBC
HCT: 36.4 % — ABNORMAL LOW (ref 39.0–52.0)
Hemoglobin: 11.3 g/dL — ABNORMAL LOW (ref 13.0–17.0)
MCH: 26.5 pg (ref 26.0–34.0)
MCHC: 31 g/dL (ref 30.0–36.0)
MCV: 85.4 fL (ref 80.0–100.0)
Platelets: 365 10*3/uL (ref 150–400)
RBC: 4.26 MIL/uL (ref 4.22–5.81)
RDW: 13.6 % (ref 11.5–15.5)
WBC: 3.8 10*3/uL — ABNORMAL LOW (ref 4.0–10.5)
nRBC: 0 % (ref 0.0–0.2)

## 2020-04-01 LAB — LIPASE, BLOOD: Lipase: 22 U/L (ref 11–51)

## 2020-04-01 MED ORDER — ONDANSETRON HCL 4 MG/2ML IJ SOLN
4.0000 mg | Freq: Once | INTRAMUSCULAR | Status: AC
  Administered 2020-04-01: 4 mg via INTRAVENOUS
  Filled 2020-04-01: qty 2

## 2020-04-01 MED ORDER — SODIUM CHLORIDE 0.9 % IV BOLUS
1000.0000 mL | Freq: Once | INTRAVENOUS | Status: AC
  Administered 2020-04-01: 1000 mL via INTRAVENOUS

## 2020-04-01 MED ORDER — DICYCLOMINE HCL 10 MG/ML IM SOLN
20.0000 mg | Freq: Once | INTRAMUSCULAR | Status: AC
  Administered 2020-04-01: 20 mg via INTRAMUSCULAR
  Filled 2020-04-01: qty 2

## 2020-04-01 MED ORDER — ONDANSETRON 4 MG PO TBDP: ORAL_TABLET | ORAL | 0 refills | Status: DC

## 2020-04-01 MED ORDER — OMEPRAZOLE 20 MG PO CPDR: 20.0000 mg | DELAYED_RELEASE_CAPSULE | Freq: Every day | ORAL | 0 refills | Status: DC

## 2020-04-01 MED ORDER — FAMOTIDINE IN NACL 20-0.9 MG/50ML-% IV SOLN
20.0000 mg | Freq: Once | INTRAVENOUS | Status: AC
  Administered 2020-04-01: 20 mg via INTRAVENOUS
  Filled 2020-04-01: qty 50

## 2020-04-01 MED ORDER — IOHEXOL 300 MG/ML  SOLN
100.0000 mL | Freq: Once | INTRAMUSCULAR | Status: AC | PRN
  Administered 2020-04-01: 100 mL via INTRAVENOUS

## 2020-04-01 NOTE — ED Notes (Signed)
Pt transported to Ultrasound.  

## 2020-04-01 NOTE — ED Triage Notes (Signed)
Pt presents with mother d/t hx of special needs and schizophrenia for eval of abdominal pain around umbilicus x 2 days with vomiting.

## 2020-04-01 NOTE — Discharge Instructions (Addendum)
Please take omeprazole daily at least 30 minutes before breakfast to help with acid reflux and abdominal pain.  You can also use Zofran as needed for nausea and vomiting, and you can take Motrin and Tylenol as needed for pain.    It is very important that she read and follow the gallbladder eating plan to help reduce pain that is likely coming from gallstones.    You will need to follow-up with both GI doctor and general surgeon for further evaluation of your gallstones and abdominal pain.  It also looks like you have some acid reflux that is causing some inflammation and thickening at the bottom of your esophagus.  The omeprazole will help with this but the GI doctor can help as well.  Return if you have persistent pain, vomiting, fevers or any other new or concerning symptoms.

## 2020-04-01 NOTE — ED Provider Notes (Signed)
[facility] DEPARTMENT Provider Note   CSN: [id number] Arrival date & time: 04/01/20  1224     History Chief Complaint  Patient presents with  . Abdominal Pain  . Emesis    Matthew [name] is a 54 y.o. male.  Matthew [name] is a 54 y.o. male with a history of schizophrenia, who presents for evaluation of abdominal pain. Patient is accompanied by his mother who is his primary care giver and assists with providing history. Pt reports two weeks of abdominal pain worsening over the past few days. Pain is localized to the upper abdomen and periumbilical region. Pain is intermittent and described as achy and sometimes burning. Patient has had associated nausea, last vomited 1 week ago, no hematemesis. Pain is worse after eating and patient has had very poor appetite and mother is concerned it is affecting his ability to take his regular medications. No abnormal or bloody bowel movements. No urinary symptoms, no fevers or chills. No CP or SOB. NO history of similar issues and no previous surgeries. No aggravating or alleviating factors.        No past medical history on file.  There are no problems to display for this patient.   No past surgical history on file.     No family history on file.  Social History   Tobacco Use  . Smoking status: Never Smoker  Substance Use Topics  . Alcohol use: No  . Drug use: Not on file    Home Medications Prior to Admission medications   Medication Sig Start Date End Date Taking? Authorizing Provider  aspirin EC 81 MG tablet Take 81 mg by mouth daily as needed (for pain or headaches).    Yes [provider]  benztropine (COGENTIN) 1 MG tablet Take 1 mg at bedtime by mouth.   Yes [provider]  cetirizine (ZYRTEC) 10 MG tablet Take 10 mg by mouth daily as needed for allergies or rhinitis.   Yes [provider]  paliperidone (INVEGA SUSTENNA) 156 MG/ML SUSP injection Inject 156 mg every 30  (thirty) days into the muscle.   Yes [provider]  Valbenazine Tosylate (INGREZZA) 40 MG CAPS Take 40 mg by mouth daily.    Yes [provider]  cetirizine (ZYRTEC) 10 MG chewable tablet Chew 10 mg by mouth daily as needed for allergies or rhinitis. Patient not taking: Reported on 04/01/2020    [provider]  chlorproMAZINE (THORAZINE) 25 MG tablet Take 1 tablet (25 mg total) by mouth 3 (three) times daily as needed for hiccoughs. Patient not taking: Reported on 04/01/2020 03/29/16   Carlisle Cater, PA-C  famotidine (PEPCID) 20 MG tablet Take 1 tablet (20 mg total) 2 (two) times daily by mouth. Patient not taking: Reported on 04/01/2020 06/06/17   Evalee Jefferson, PA-C  naproxen (NAPROSYN) 500 MG tablet Take 1 tablet (500 mg total) by mouth 2 (two) times daily. Patient not taking: Reported on 04/01/2020 03/29/16   Carlisle Cater, PA-C  omeprazole (PRILOSEC) 20 MG capsule Take 1 capsule (20 mg total) by mouth daily. 04/01/20   Jacqlyn Larsen, PA-C  ondansetron (ZOFRAN ODT) 4 MG disintegrating tablet 4mg  ODT q4 hours prn nausea/vomit 04/01/20   Jacqlyn Larsen, PA-C  ondansetron (ZOFRAN) 4 MG tablet Take 1 tablet (4 mg total) every 8 (eight) hours as needed by mouth for nausea or vomiting. Patient not taking: Reported on 04/01/2020 06/06/17   Evalee Jefferson, PA-C    Allergies    Patient  has no known allergies.  Review of Systems   Review of Systems  Constitutional: Positive for appetite change. Negative for chills and fever.  HENT: Negative.   Respiratory: Negative for cough and shortness of breath.   Cardiovascular: Negative for chest pain.  Gastrointestinal: Positive for abdominal pain, nausea and vomiting. Negative for blood in stool, constipation and diarrhea.  Genitourinary: Negative for dysuria, flank pain, frequency and hematuria.  Musculoskeletal: Negative for arthralgias and myalgias.  Skin: Negative for color change and rash.  Neurological: Negative for dizziness,  syncope and light-headedness.  All other systems reviewed and are negative.   Physical Exam Updated Vital Signs BP 99/65 (BP Location: Left Arm)   Pulse 94   Temp 99 F (37.2 C) (Oral)   Resp 14   Wt 61.7 kg   SpO2 99%   BMI 20.08 kg/m   Physical Exam Vitals and nursing note reviewed.  Constitutional:      General: He is not in acute distress.    Appearance: He is well-developed. He is not ill-appearing or diaphoretic.     Comments: Well-appearing and in no distress  HENT:     Head: Normocephalic and atraumatic.  Eyes:     General:        Right eye: No discharge.        Left eye: No discharge.     Pupils: Pupils are equal, round, and reactive to light.  Cardiovascular:     Rate and Rhythm: Normal rate and regular rhythm.     Heart sounds: Normal heart sounds.  Pulmonary:     Effort: Pulmonary effort is normal. No respiratory distress.     Breath sounds: Normal breath sounds. No wheezing or rales.  Abdominal:     General: Bowel sounds are normal. There is no distension.     Palpations: Abdomen is soft. There is no mass.     Tenderness: There is abdominal tenderness. There is no guarding.     Comments: Abdomen is soft, non-distended, bowel sounds present throughout, there is tenderness present across the upper abdomen and in the periumbilical region, does not localize to one side, to lower abdominal tenderness, no guarding or peritoneal signs  Musculoskeletal:        General: No deformity.     Cervical back: Neck supple.  Skin:    General: Skin is warm and dry.     Capillary Refill: Capillary refill takes less than 2 seconds.  Neurological:     Mental Status: He is alert.     Coordination: Coordination normal.     Comments: Speech is clear, able to follow commands Moves extremities without ataxia, coordination intact  Psychiatric:        Mood and Affect: Mood normal.        Behavior: Behavior normal.     ED Results / Procedures / Treatments   Labs (all labs  ordered are listed, but only abnormal results are displayed) Labs Reviewed  COMPREHENSIVE METABOLIC PANEL - Abnormal; Notable for the following components:      Result Value   AST 13 (*)    All other components within normal limits  CBC - Abnormal; Notable for the following components:   WBC 3.8 (*)    Hemoglobin 11.3 (*)    HCT 36.4 (*)    All other components within normal limits  URINALYSIS, ROUTINE W REFLEX MICROSCOPIC - Abnormal; Notable for the following components:   Ketones, ur 5 (*)    All other components within normal  limits  LIPASE, BLOOD    EKG None  Radiology CT Abdomen Pelvis W Contrast  Result Date: 04/01/2020 CLINICAL DATA:  Epigastric pain EXAM: CT ABDOMEN AND PELVIS WITH CONTRAST TECHNIQUE: Multidetector CT imaging of the abdomen and pelvis was performed using the standard protocol following bolus administration of intravenous contrast. CONTRAST:  165mL OMNIPAQUE IOHEXOL 300 MG/ML  SOLN COMPARISON:  None. FINDINGS: Lower chest: Atelectatic changes in the otherwise clear lung bases. Normal heart size. No pericardial effusion. Hepatobiliary: Respiratory motion artifact limiting evaluation of the hepatic parenchyma. Few scattered subcentimeter hypertension foci too small to fully characterize on CT imaging but statistically likely benign. No focal concerning liver lesions. Smooth liver surface contour. Normal hepatic attenuation. Gallbladder appears contracted around multiple partially calcified gallstones. No pericholecystic fluid or inflammation. Pancreas: Unremarkable. No pancreatic ductal dilatation or surrounding inflammatory changes. Spleen: Normal in size. No concerning splenic lesions. Adrenals/Urinary Tract: Normal adrenal glands. Kidneys enhance and excrete symmetrically. Partially exophytic 3.3 cm cyst arising from the upper pole left kidney (3/25). Few additional subcentimeter hypertension foci too small to fully characterize on CT imaging but statistically likely  benign. No concerning renal masses. No urolithiasis or hydronephrosis. Mild circumferential bladder wall thickening and indentation of the bladder base by an enlarged prostate. Stomach/Bowel: Some circumferential distal esophageal thickening without [place] off a GIA fluid, gas or adenopathy. Stomach and duodenum are unremarkable. No small bowel thickening or dilatation. No proximal colonic thickening or dilatation. Few scattered noninflamed colonic diverticula. Segmental thickening of the mid to distal sigmoid though possibly accentuated by some underdistention (6/63) but with some focal narrowing present. No resulting obstruction. No surrounding inflammation. Vascular/Lymphatic: Atherosclerotic calcifications within the abdominal aorta and branch vessels. No aneurysm or ectasia. No enlarged abdominopelvic lymph nodes. Reproductive: Mild prostatomegaly. Cystic appearing changes in the bilateral seminal vesicles, can be senescent. Other: No abdominopelvic free fluid or free gas. No bowel containing hernias. Small fat containing umbilical hernia. Musculoskeletal: No acute osseous abnormality or suspicious osseous lesion. Minimal degenerative changes in the spine hips and pelvis. IMPRESSION: 1. Gallbladder decompressed bowel also partially calcified gallstones. No pericholecystic inflammation though if there is persisting clinical concern for cholecystitis right upper quadrant ultrasound could be obtained. 2. Circumferential thickening of the distal thoracic esophagus. Could reflect features of esophagitis. Correlate with clinical symptoms and consider endoscopy as clinically warranted. 3. Additional segmental thickening of the mid to distal sigmoid with focal narrowing. No acute surrounding inflammation or resulting obstruction. Findings are nonspecific, and could reflect sequela of prior inflammation/infection. However, recommend correlation with colonoscopy if not recently performed. 4. Mild circumferential bladder  wall thickening and indentation of the bladder base by an enlarged prostate. Possibly sequela of chronic outlet obstruction though could correlate with urinalysis to exclude cystitis. 5. Aortic Atherosclerosis (ICD10-I70.0). Electronically Signed   By: Lovena Le M.D.   On: 04/01/2020 16:40   US Abdomen Limited RUQ  Result Date: 04/01/2020 CLINICAL DATA:  Right upper quadrant pain EXAM: ULTRASOUND ABDOMEN LIMITED RIGHT UPPER QUADRANT COMPARISON:  CT 04/01/2020 FINDINGS: Gallbladder: Diffuse shadowing consistent with multiple stones. Normal wall thickness. Negative sonographic Murphy. Common bile duct: Diameter: 4 mm Liver: No focal lesion identified. Within normal limits in parenchymal echogenicity. Portal vein is patent on color Doppler imaging with normal direction of blood flow towards the liver. Other: None. IMPRESSION: Cholelithiasis without sonographic evidence for acute cholecystitis. Electronically Signed   By: Donavan Foil M.D.   On: 04/01/2020 18:07    Procedures Procedures (including critical care time)  Medications Ordered in  ED Medications  sodium chloride 0.9 % bolus 1,000 mL (0 mLs Intravenous Stopped 04/01/20 1606)  ondansetron (ZOFRAN) injection 4 mg (4 mg Intravenous Given 04/01/20 1459)  famotidine (PEPCID) IVPB 20 mg premix (0 mg Intravenous Stopped 04/01/20 1606)  dicyclomine (BENTYL) injection 20 mg (20 mg Intramuscular Given 04/01/20 1502)  iohexol (OMNIPAQUE) 300 MG/ML solution 100 mL (100 mLs Intravenous Contrast Given 04/01/20 1623)    ED Course  I have reviewed the triage vital signs and the nursing notes.  Pertinent labs & imaging results that were available during my care of the patient were reviewed by me and considered in my medical decision making (see chart for details).    MDM Rules/Calculators/A&P                          Patient presents to the ED with complaints of abdominal pain. Patient nontoxic appearing, in no apparent distress, vitals WNL. On exam  patient tender to palpation primarily in the epigastric and periumbilical areas but does also have some mild right upper quadrant tenderness, no peritoneal signs.  Exam and history are limited due to patient's schizophrenia and difficulty answering questions.  Will evaluate with labs and CT abdomen pelvis. Analgesics, anti-emetics, and fluids administered.   I have independently ordered, reviewed and interpreted all labs and imaging: CBC: Mild leukopenia, stable hemoglobin CMP: No significant electrolyte derangements, normal renal and liver function Lipase: WNL UA: Without evidence of infection  Imaging: CT shows gallbladder decompressed around multiple calcified gallstones, no obvious pericholecystic fluid or inflammation noted, recommend right upper quadrant ultrasound if there is potential clinical concern for cholecystitis.  There is also some circumferential thickening noted of the distal esophagus that could represent some esophagitis, stomach and small bowel are unremarkable.  Additional segmental thickening of the mid to distal sigmoid with focal narrowing. No acute surrounding inflammation or resulting obstruction. Findings are nonspecific, and could reflect sequela of prior inflammation/infection. However, recommend correlation with colonoscopy.  There is also some thickening of the bladder with noted enlarged prostate, urinalysis without evidence of infection.  Given exam and history limited from patient, will get right upper quadrant ultrasound.  Right upper quadrant ultrasound with gallstones noted but no evidence of cholecystitis.  On repeat abdominal exam patient remains without peritoneal signs CT with evidence of gallstones, ultrasound confirms no evidence of acute cholecystitis.  CT also shows some signs of esophagitis.  There is some segmental thickening of the sigmoid colon without associated inflammation, recommend outpatient follow-up with colonoscopy, patient does not have any  issues with bowel movements at this time.  Will treat with omeprazole, Zofran, Motrin and Tylenol.  Patient tolerating PO in the emergency department. Will discharge home with supportive measures. I discussed results, treatment plan, need for GI and general surgery follow-up, and return precautions with the patient. Provided opportunity for questions, patient confirmed understanding and is in agreement with plan.    Final Clinical Impression(s) / ED Diagnoses Final diagnoses:  Upper abdominal pain  Gallstones    Rx / DC Orders ED Discharge Orders         Ordered    omeprazole (PRILOSEC) 20 MG capsule  Daily,   Status:  Discontinued        04/01/20 1840    ondansetron (ZOFRAN ODT) 4 MG disintegrating tablet  Status:  Discontinued        04/01/20 1840    omeprazole (PRILOSEC) 20 MG capsule  Daily  04/01/20 1859    ondansetron (ZOFRAN ODT) 4 MG disintegrating tablet        04/01/20 1859           Jacqlyn Larsen, PA-C 04/06/20 0076    Lennice Sites, DO 04/06/20 1845

## 2021-02-24 ENCOUNTER — Other Ambulatory Visit: Payer: Self-pay | Admitting: Nurse Practitioner

## 2021-04-01 ENCOUNTER — Other Ambulatory Visit: Payer: Self-pay | Admitting: Nurse Practitioner

## 2021-04-01 DIAGNOSIS — Z8719 Personal history of other diseases of the digestive system: Secondary | ICD-10-CM

## 2021-04-01 DIAGNOSIS — R109 Unspecified abdominal pain: Secondary | ICD-10-CM

## 2021-05-06 ENCOUNTER — Other Ambulatory Visit: Payer: Medicaid Other

## 2021-05-12 ENCOUNTER — Other Ambulatory Visit: Payer: Medicaid Other

## 2021-05-15 ENCOUNTER — Ambulatory Visit
Admission: RE | Admit: 2021-05-15 | Discharge: 2021-05-15 | Disposition: A | Discharge status: Home / Self Care | Payer: Medicaid Other | Source: Ambulatory Visit | Attending: Nurse Practitioner | Admitting: Nurse Practitioner

## 2021-05-15 DIAGNOSIS — R109 Unspecified abdominal pain: Secondary | ICD-10-CM

## 2021-05-15 DIAGNOSIS — Z8719 Personal history of other diseases of the digestive system: Secondary | ICD-10-CM

## 2021-08-06 ENCOUNTER — Other Ambulatory Visit: Payer: Self-pay

## 2021-08-06 ENCOUNTER — Encounter (HOSPITAL_COMMUNITY): Payer: Self-pay

## 2021-08-06 ENCOUNTER — Inpatient Hospital Stay (HOSPITAL_COMMUNITY)
Admission: EM | Admit: 2021-08-06 | Discharge: 2021-08-22 | DRG: 329 | Disposition: A | Discharge status: Home Health | Payer: Medicaid Other | Attending: General Surgery | Admitting: General Surgery

## 2021-08-06 ENCOUNTER — Emergency Department (HOSPITAL_COMMUNITY): Payer: Medicaid Other

## 2021-08-06 DIAGNOSIS — B9620 Unspecified Escherichia coli [E. coli] as the cause of diseases classified elsewhere: Secondary | ICD-10-CM | POA: Diagnosis present

## 2021-08-06 DIAGNOSIS — R066 Hiccough: Secondary | ICD-10-CM | POA: Diagnosis not present

## 2021-08-06 DIAGNOSIS — R1084 Generalized abdominal pain: Secondary | ICD-10-CM

## 2021-08-06 DIAGNOSIS — A419 Sepsis, unspecified organism: Secondary | ICD-10-CM | POA: Diagnosis present

## 2021-08-06 DIAGNOSIS — Z01818 Encounter for other preprocedural examination: Secondary | ICD-10-CM

## 2021-08-06 DIAGNOSIS — K567 Ileus, unspecified: Secondary | ICD-10-CM | POA: Diagnosis not present

## 2021-08-06 DIAGNOSIS — Z4659 Encounter for fitting and adjustment of other gastrointestinal appliance and device: Secondary | ICD-10-CM

## 2021-08-06 DIAGNOSIS — Z9889 Other specified postprocedural states: Secondary | ICD-10-CM

## 2021-08-06 DIAGNOSIS — K56609 Unspecified intestinal obstruction, unspecified as to partial versus complete obstruction: Secondary | ICD-10-CM | POA: Diagnosis present

## 2021-08-06 DIAGNOSIS — K631 Perforation of intestine (nontraumatic): Secondary | ICD-10-CM | POA: Diagnosis present

## 2021-08-06 DIAGNOSIS — K630 Abscess of intestine: Secondary | ICD-10-CM | POA: Diagnosis present

## 2021-08-06 DIAGNOSIS — K5720 Diverticulitis of large intestine with perforation and abscess without bleeding: Secondary | ICD-10-CM | POA: Diagnosis present

## 2021-08-06 DIAGNOSIS — N179 Acute kidney failure, unspecified: Secondary | ICD-10-CM | POA: Diagnosis present

## 2021-08-06 DIAGNOSIS — Z681 Body mass index (BMI) 19 or less, adult: Secondary | ICD-10-CM

## 2021-08-06 DIAGNOSIS — K56699 Other intestinal obstruction unspecified as to partial versus complete obstruction: Principal | ICD-10-CM | POA: Diagnosis present

## 2021-08-06 DIAGNOSIS — Z933 Colostomy status: Secondary | ICD-10-CM

## 2021-08-06 DIAGNOSIS — C187 Malignant neoplasm of sigmoid colon: Secondary | ICD-10-CM | POA: Diagnosis present

## 2021-08-06 DIAGNOSIS — Z79899 Other long term (current) drug therapy: Secondary | ICD-10-CM

## 2021-08-06 DIAGNOSIS — F209 Schizophrenia, unspecified: Secondary | ICD-10-CM | POA: Diagnosis present

## 2021-08-06 DIAGNOSIS — R7881 Bacteremia: Secondary | ICD-10-CM | POA: Diagnosis present

## 2021-08-06 DIAGNOSIS — E43 Unspecified severe protein-calorie malnutrition: Secondary | ICD-10-CM | POA: Diagnosis present

## 2021-08-06 DIAGNOSIS — C189 Malignant neoplasm of colon, unspecified: Secondary | ICD-10-CM

## 2021-08-06 DIAGNOSIS — C772 Secondary and unspecified malignant neoplasm of intra-abdominal lymph nodes: Secondary | ICD-10-CM | POA: Diagnosis present

## 2021-08-06 DIAGNOSIS — K650 Generalized (acute) peritonitis: Secondary | ICD-10-CM | POA: Diagnosis present

## 2021-08-06 DIAGNOSIS — Z20822 Contact with and (suspected) exposure to covid-19: Secondary | ICD-10-CM | POA: Diagnosis present

## 2021-08-06 DIAGNOSIS — Z0189 Encounter for other specified special examinations: Secondary | ICD-10-CM

## 2021-08-06 HISTORY — DX: Schizophrenia, unspecified: F20.9

## 2021-08-06 LAB — COMPREHENSIVE METABOLIC PANEL
ALT: 39 U/L (ref 0–44)
AST: 38 U/L (ref 15–41)
Albumin: 3.7 g/dL (ref 3.5–5.0)
Alkaline Phosphatase: 108 U/L (ref 38–126)
Anion gap: 14 (ref 5–15)
BUN: 14 mg/dL (ref 6–20)
CO2: 23 mmol/L (ref 22–32)
Calcium: 9.4 mg/dL (ref 8.9–10.3)
Chloride: 100 mmol/L (ref 98–111)
Creatinine, Ser: 1.48 mg/dL — ABNORMAL HIGH (ref 0.61–1.24)
GFR, Estimated: 56 mL/min — ABNORMAL LOW (ref >60)
Glucose, Bld: 165 mg/dL — ABNORMAL HIGH (ref 70–99)
Potassium: 3.5 mmol/L (ref 3.5–5.1)
Sodium: 137 mmol/L (ref 135–145)
Total Bilirubin: 3.8 mg/dL — ABNORMAL HIGH (ref 0.3–1.2)
Total Protein: 8 g/dL (ref 6.5–8.1)

## 2021-08-06 LAB — CBC WITH DIFFERENTIAL/PLATELET
Abs Immature Granulocytes: 0.04 10*3/uL (ref 0.00–0.07)
Basophils Absolute: 0 10*3/uL (ref 0.0–0.1)
Basophils Relative: 0 %
Eosinophils Absolute: 0 10*3/uL (ref 0.0–0.5)
Eosinophils Relative: 0 %
HCT: 38.8 % — ABNORMAL LOW (ref 39.0–52.0)
Hemoglobin: 12.4 g/dL — ABNORMAL LOW (ref 13.0–17.0)
Immature Granulocytes: 0 %
Lymphocytes Relative: 2 %
Lymphs Abs: 0.2 10*3/uL — ABNORMAL LOW (ref 0.7–4.0)
MCH: 27.6 pg (ref 26.0–34.0)
MCHC: 32 g/dL (ref 30.0–36.0)
MCV: 86.2 fL (ref 80.0–100.0)
Monocytes Absolute: 0.4 10*3/uL (ref 0.1–1.0)
Monocytes Relative: 4 %
Neutro Abs: 10.7 10*3/uL — ABNORMAL HIGH (ref 1.7–7.7)
Neutrophils Relative %: 94 %
Platelets: 353 10*3/uL (ref 150–400)
RBC: 4.5 MIL/uL (ref 4.22–5.81)
RDW: 12.8 % (ref 11.5–15.5)
WBC: 11.4 10*3/uL — ABNORMAL HIGH (ref 4.0–10.5)
nRBC: 0 % (ref 0.0–0.2)

## 2021-08-06 LAB — RESP PANEL BY RT-PCR (FLU A&B, COVID) ARPGX2
Influenza A by PCR: NEGATIVE
Influenza B by PCR: NEGATIVE
SARS Coronavirus 2 by RT PCR: NEGATIVE

## 2021-08-06 LAB — LACTIC ACID, PLASMA
Lactic Acid, Venous: 3 mmol/L (ref 0.5–1.9)
Lactic Acid, Venous: 2.4 mmol/L (ref 0.5–1.9)

## 2021-08-06 LAB — LIPASE, BLOOD: Lipase: 22 U/L (ref 11–51)

## 2021-08-06 MED ORDER — SODIUM CHLORIDE 0.9 % IV BOLUS
1000.0000 mL | Freq: Once | INTRAVENOUS | Status: AC
  Administered 2021-08-06: 1000 mL via INTRAVENOUS

## 2021-08-06 MED ORDER — SODIUM CHLORIDE 0.9 % IV BOLUS
1000.0000 mL | Freq: Once | INTRAVENOUS | Status: AC
Start: 2021-08-06 — End: 2021-08-06
  Administered 2021-08-06: 1000 mL via INTRAVENOUS

## 2021-08-06 MED ORDER — IOHEXOL 350 MG/ML SOLN
75.0000 mL | Freq: Once | INTRAVENOUS | Status: AC | PRN
  Administered 2021-08-06: 75 mL via INTRAVENOUS

## 2021-08-06 MED ORDER — PIPERACILLIN-TAZOBACTAM 3.375 G IVPB
3.3750 g | Freq: Once | INTRAVENOUS | Status: AC
  Administered 2021-08-06: 3.375 g via INTRAVENOUS
  Filled 2021-08-06: qty 50

## 2021-08-06 NOTE — ED Provider Triage Note (Signed)
Emergency Medicine Provider Triage Evaluation Note  Kairos Panetta , a 56 y.o. male  was evaluated in triage.  Pt complains of left lower quadrant abdominal pain associate with nausea and vomiting x1 week.  Denies diarrhea.  No chest pain or shortness of breath.  Denies fever and chills.  No urinary symptoms.  Review of Systems  Positive: Abdominal pain, N/V Negative: diarrhea  Physical Exam  BP 101/74 (BP Location: Right Arm)    Pulse (!) 130    Temp 99.8 F (37.7 C) (Oral)    Resp 18    SpO2 100%  Gen:   Awake, no distress   Resp:  Normal effort  MSK:   Moves extremities without difficulty  Other:  LLQ TTP  Medical Decision Making  Medically screening exam initiated at 7:09 PM.  Appropriate orders placed.  Yunis Voorheis was informed that the remainder of the evaluation will be completed by another provider, this initial triage assessment does not replace that evaluation, and the importance of remaining in the ED until their evaluation is complete.  Concern for possible sepsis Labs CT abdomen   Suzy Bouchard, PA-C 08/06/21 1911

## 2021-08-06 NOTE — ED Provider Notes (Signed)
Care of patient assumed from [place] at 2358.  Agree with history, physical exam and plan.  See their note for further details. Briefly, patient with history of schizophrenia and gallstones presents emergency department chief plaint of abdominal pain.  Patient reports that pain has been present over the last week.  She also endorses nausea and vomiting.  No history of abdominal surgeries.  CT abdomen pelvis obtained 03/2020 showed gallstones, question of esophagitis, thickening of the mid to distal sigmoid with narrowing.  For worsening.  Physical Exam  BP 127/85    Pulse (!) 108    Temp 98.5 F (36.9 C) (Oral)    Resp (!) 32    SpO2 99%   Physical Exam Vitals and nursing note reviewed.  Constitutional:      General: He is not in acute distress.    Appearance: He is not ill-appearing, toxic-appearing or diaphoretic.  HENT:     Head: Normocephalic.  Eyes:     General: No scleral icterus.       Right eye: No discharge.        Left eye: No discharge.  Cardiovascular:     Rate and Rhythm: Normal rate.  Pulmonary:     Effort: Pulmonary effort is normal.  Abdominal:     General: Abdomen is flat.     Palpations: Abdomen is soft. There is no mass or pulsatile mass.     Tenderness: There is generalized abdominal tenderness. There is no guarding or rebound.  Skin:    General: Skin is warm and dry.  Neurological:     General: No focal deficit present.     Mental Status: He is alert.  Psychiatric:        Mood and Affect: Affect is flat.        Behavior: Behavior is cooperative.    Procedures  .Critical Care Performed by: Loni Beckwith, PA-C Authorized by: Loni Beckwith, PA-C   Critical care provider statement:    Critical care time (minutes):  30   Critical care was time spent personally by me on the following activities:  Development of treatment plan with patient or surrogate, discussions with consultants, evaluation of patient's response to treatment, examination  of patient, ordering and review of laboratory studies, ordering and review of radiographic studies, pulse oximetry, re-evaluation of patient's condition and review of old charts   Care discussed with: admitting provider    ED Course / MDM   Clinical Course as of 08/07/21 0203  Thu Aug 07, 2021  0031 Spoke with Dr. Dema Severin, general surgery.  He will assess the patient.  Recommends Zosyn which the patient has already received.  On my evaluation, patient is clinically stable.  Blood pressure 120/66.  Mildly tachycardic 110.  Not significantly tender.  Family updated at the bedside. [CH]  0131 Spoke to hospitalist Dr. Alcario Drought who will see the patient for admission. [PB]    Clinical Course User Index [CH] Horton, Barbette Hair, MD [PB] Loni Beckwith, PA-C   Medical Decision Making  At time of handoff CT abdomen pelvis pending.  CT scan showed several 1 cm pericolonic abscess, sigmoid colonic perforation with small intraperitoneal gas, inflammatory stranding of the sigmoid colon, inflammatory changes at the terminal ileum.  Findings of CT scan were discussed with patient's mother at bedside and patient.  Patient has already received IV Zosyn.  Due to CT scan findings general surgery was consulted by attending physician Dr. Dina Rich.  After assessment Dr. Dema Severin recommended admission  to hospitalist with general surgery team remaining on his consult.  I spoke to hospitalist Dr. Alcario Drought who will see the patient for admission.       Loni Beckwith, PA-C 08/07/21 7619    Merryl Hacker, MD 08/11/21 Natasha Mead

## 2021-08-06 NOTE — ED Provider Notes (Signed)
[facility] EMERGENCY DEPARTMENT Provider Note   CSN: [id number] Arrival date & time: 08/06/21  1729     History  Chief Complaint  Patient presents with   Abdominal Pain    Matthew [name] is a 56 y.o. male.  Patient with history of schizophrenia on [place] history of gallstones--presents to the emergency department for abdominal pain.  Patient states that he has had upper abdominal pain over the past 1 week.  He has had vomiting as well, last episode today.  No diarrhea or constipation.  No hematuria or irritative UTI symptoms including dysuria, increased frequency or urgency.  No chest pain or shortness of breath.  No cough or fever.  Patient's mother at bedside.  She states that he first mentioned the pain today.  He was not moving normally and just did not look well.  This prompted them to come to the emergency department for evaluation.  States that he did want to eat or drink anything. The onset of this condition was acute. The course is constant. Aggravating factors: none. Alleviating factors: none.   CT 03/2020: Showed gallstones, question of esophagitis, thickening of the mid-distal sigmoid with narrowing, bladder wall thickening.      Home Medications Prior to Admission medications   Medication Sig Start Date End Date Taking? Authorizing Provider  aspirin EC 81 MG tablet Take 81 mg by mouth daily as needed (for pain or headaches).     [provider]  benztropine (COGENTIN) 1 MG tablet Take 1 mg at bedtime by mouth.    [provider]  cetirizine (ZYRTEC) 10 MG chewable tablet Chew 10 mg by mouth daily as needed for allergies or rhinitis. Patient not taking: Reported on 04/01/2020    [provider]  cetirizine (ZYRTEC) 10 MG tablet Take 10 mg by mouth daily as needed for allergies or rhinitis.    [provider]  chlorproMAZINE (THORAZINE) 25 MG tablet Take 1 tablet (25 mg total) by mouth 3 (three) times daily as needed for  hiccoughs. Patient not taking: Reported on 04/01/2020 03/29/16   Carlisle Cater, PA-C  famotidine (PEPCID) 20 MG tablet Take 1 tablet (20 mg total) 2 (two) times daily by mouth. Patient not taking: Reported on 04/01/2020 06/06/17   Evalee Jefferson, PA-C  naproxen (NAPROSYN) 500 MG tablet Take 1 tablet (500 mg total) by mouth 2 (two) times daily. Patient not taking: Reported on 04/01/2020 03/29/16   Carlisle Cater, PA-C  omeprazole (PRILOSEC) 20 MG capsule Take 1 capsule (20 mg total) by mouth daily. 04/01/20   Jacqlyn Larsen, PA-C  ondansetron (ZOFRAN ODT) 4 MG disintegrating tablet 4mg  ODT q4 hours prn nausea/vomit 04/01/20   Jacqlyn Larsen, PA-C  ondansetron (ZOFRAN) 4 MG tablet Take 1 tablet (4 mg total) every 8 (eight) hours as needed by mouth for nausea or vomiting. Patient not taking: Reported on 04/01/2020 06/06/17   Evalee Jefferson, PA-C  paliperidone (INVEGA SUSTENNA) 156 MG/ML SUSP injection Inject 156 mg every 30 (thirty) days into the muscle.    [provider]  Valbenazine Tosylate (INGREZZA) 40 MG CAPS Take 40 mg by mouth daily.     [provider]      Allergies    Patient has no known allergies.    Review of Systems   Review of Systems  Constitutional:  Positive for activity change. Negative for chills and fever.  HENT:  Negative for rhinorrhea and sore throat.   Eyes:  Negative for redness.  Respiratory:  Negative for cough.   Cardiovascular:  Negative for chest pain.  Gastrointestinal:  Positive for abdominal pain, nausea and vomiting. Negative for diarrhea.  Genitourinary:  Negative for dysuria and hematuria.  Musculoskeletal:  Negative for myalgias.  Skin:  Negative for rash.  Neurological:  Negative for headaches.   Physical Exam Updated Vital Signs BP 119/83    Pulse (!) 118    Temp 98.5 F (36.9 C) (Oral)    Resp (!) 27    SpO2 97%  Physical Exam Vitals and nursing note reviewed.  Constitutional:      General: He is not in acute distress.    Appearance: He  is well-developed.  HENT:     Head: Normocephalic and atraumatic.  Eyes:     General:        Right eye: No discharge.        Left eye: No discharge.     Conjunctiva/sclera: Conjunctivae normal.  Cardiovascular:     Rate and Rhythm: Regular rhythm. Tachycardia present.     Heart sounds: Normal heart sounds.  Pulmonary:     Effort: Pulmonary effort is normal.     Breath sounds: Normal breath sounds.  Abdominal:     Palpations: Abdomen is soft.     Tenderness: There is abdominal tenderness in the epigastric area and periumbilical area. There is no guarding or rebound. Negative signs include Murphy's sign and McBurney's sign.  Musculoskeletal:     Cervical back: Normal range of motion and neck supple.  Skin:    General: Skin is warm and dry.  Neurological:     Mental Status: He is alert.  Psychiatric:     Comments: Flat affect.    ED Results / Procedures / Treatments   Labs (all labs ordered are listed, but only abnormal results are displayed) Labs Reviewed  COMPREHENSIVE METABOLIC PANEL - Abnormal; Notable for the following components:      Result Value   Glucose, Bld 165 (*)    Creatinine, Ser 1.48 (*)    Total Bilirubin 3.8 (*)    GFR, Estimated 56 (*)    All other components within normal limits  CBC WITH DIFFERENTIAL/PLATELET - Abnormal; Notable for the following components:   WBC 11.4 (*)    Hemoglobin 12.4 (*)    HCT 38.8 (*)    Neutro Abs 10.7 (*)    Lymphs Abs 0.2 (*)    All other components within normal limits  LACTIC ACID, PLASMA - Abnormal; Notable for the following components:   Lactic Acid, Venous 3.0 (*)    All other components within normal limits  LACTIC ACID, PLASMA - Abnormal; Notable for the following components:   Lactic Acid, Venous 2.4 (*)    All other components within normal limits  RESP PANEL BY RT-PCR (FLU A&B, COVID) ARPGX2  CULTURE, BLOOD (ROUTINE X 2)  CULTURE, BLOOD (ROUTINE X 2)  URINE CULTURE  LIPASE, BLOOD  URINALYSIS, ROUTINE W  REFLEX MICROSCOPIC    EKG EKG Interpretation  Date/Time:  Wednesday August 06 2021 19:12:24 EST Ventricular Rate:  134 PR Interval:  156 QRS Duration: 66 QT Interval:  280 QTC Calculation: 418 R Axis:   28 Text Interpretation: Sinus tachycardia Otherwise normal ECG When compared with ECG of 29-Mar-2016 11:05, PREVIOUS ECG IS PRESENT Since last tracing rate faster Otherwise no significant change Confirmed by Deno Etienne [phone]) on 08/06/2021 10:50:56 PM  Radiology No results found.  Procedures Procedures    Medications Ordered in ED Medications  piperacillin-tazobactam (ZOSYN)  IVPB 3.375 g (3.375 g Intravenous New Bag/Given 08/06/21 2118)  sodium chloride 0.9 % bolus 1,000 mL (1,000 mLs Intravenous New Bag/Given 08/06/21 2237)  sodium chloride 0.9 % bolus 1,000 mL (0 mLs Intravenous Stopped 08/06/21 2237)  iohexol (OMNIPAQUE) 350 MG/ML injection 75 mL (75 mLs Intravenous Contrast Given 08/06/21 2307)    ED Course/ Medical Decision Making/ A&P    Patient seen and examined. Plan discussed with patient.   Labs: Labs ordered in triage, reviewed.  Patient has mildly elevated white blood cell count at 11.4, AKI with creatinine of 1.48, and lactic acid of 3.0.  Temperature was 99.8 F on arrival with tachycardia.  Imaging: CT imaging of the abdomen and pelvis ordered and pending.  Medications/Fluids: Given concern for sepsis and intra-abdominal infection, patient will be given fluid bolus and IV Zosyn.    Vital signs reviewed and are as follows: BP 119/83    Pulse (!) 118    Temp 98.5 F (36.9 C) (Oral)    Resp (!) 27    SpO2 97%   Initial impression: Abdominal pain, dehydration, possible sepsis.  Work-up in progress.  11:33 PM CT imaging completed. I have personally reviewed and interpreted imaging. Awaiting radiology read.   Reviewed labs this point.  Lactate 3.0 >> 2.4.  AKI noted baseline 1.0 >> 1.5.  Patient has received IV fluids and IV antibiotics.  11:53 PM Signout to  [employer] at shift change pending results of CT imaging.                           Medical Decision Making          Final Clinical Impression(s) / ED Diagnoses Final diagnoses:  Generalized abdominal pain  AKI (acute kidney injury) [facility])    Rx / DC Orders ED Discharge Orders     None         Carlisle Cater, PA-C 08/06/21 2354    Merryl Hacker, MD 08/07/21 [phone]

## 2021-08-06 NOTE — ED Triage Notes (Signed)
Pt arrived POV from home c/o lower abdominal pain and N/V x1 week. Pt denies any diarrhea.

## 2021-08-07 ENCOUNTER — Inpatient Hospital Stay (HOSPITAL_COMMUNITY): Payer: Medicaid Other

## 2021-08-07 ENCOUNTER — Encounter (HOSPITAL_COMMUNITY): Payer: Self-pay | Admitting: Internal Medicine

## 2021-08-07 ENCOUNTER — Emergency Department (HOSPITAL_COMMUNITY): Payer: Medicaid Other

## 2021-08-07 DIAGNOSIS — K630 Abscess of intestine: Secondary | ICD-10-CM | POA: Diagnosis not present

## 2021-08-07 DIAGNOSIS — C187 Malignant neoplasm of sigmoid colon: Secondary | ICD-10-CM | POA: Diagnosis present

## 2021-08-07 DIAGNOSIS — K56699 Other intestinal obstruction unspecified as to partial versus complete obstruction: Secondary | ICD-10-CM | POA: Diagnosis present

## 2021-08-07 DIAGNOSIS — K631 Perforation of intestine (nontraumatic): Secondary | ICD-10-CM | POA: Diagnosis present

## 2021-08-07 DIAGNOSIS — Z79899 Other long term (current) drug therapy: Secondary | ICD-10-CM | POA: Diagnosis not present

## 2021-08-07 DIAGNOSIS — N179 Acute kidney failure, unspecified: Secondary | ICD-10-CM

## 2021-08-07 DIAGNOSIS — R1084 Generalized abdominal pain: Secondary | ICD-10-CM | POA: Diagnosis not present

## 2021-08-07 DIAGNOSIS — R066 Hiccough: Secondary | ICD-10-CM | POA: Diagnosis not present

## 2021-08-07 DIAGNOSIS — R7881 Bacteremia: Secondary | ICD-10-CM | POA: Diagnosis present

## 2021-08-07 DIAGNOSIS — B9620 Unspecified Escherichia coli [E. coli] as the cause of diseases classified elsewhere: Secondary | ICD-10-CM | POA: Diagnosis present

## 2021-08-07 DIAGNOSIS — C772 Secondary and unspecified malignant neoplasm of intra-abdominal lymph nodes: Secondary | ICD-10-CM | POA: Diagnosis present

## 2021-08-07 DIAGNOSIS — F209 Schizophrenia, unspecified: Secondary | ICD-10-CM | POA: Diagnosis present

## 2021-08-07 DIAGNOSIS — K56609 Unspecified intestinal obstruction, unspecified as to partial versus complete obstruction: Secondary | ICD-10-CM

## 2021-08-07 DIAGNOSIS — Z0189 Encounter for other specified special examinations: Secondary | ICD-10-CM

## 2021-08-07 DIAGNOSIS — Z20822 Contact with and (suspected) exposure to covid-19: Secondary | ICD-10-CM | POA: Diagnosis present

## 2021-08-07 DIAGNOSIS — K5720 Diverticulitis of large intestine with perforation and abscess without bleeding: Secondary | ICD-10-CM | POA: Diagnosis present

## 2021-08-07 DIAGNOSIS — Z681 Body mass index (BMI) 19 or less, adult: Secondary | ICD-10-CM | POA: Diagnosis not present

## 2021-08-07 DIAGNOSIS — K650 Generalized (acute) peritonitis: Secondary | ICD-10-CM | POA: Diagnosis present

## 2021-08-07 DIAGNOSIS — E43 Unspecified severe protein-calorie malnutrition: Secondary | ICD-10-CM | POA: Diagnosis present

## 2021-08-07 DIAGNOSIS — A419 Sepsis, unspecified organism: Secondary | ICD-10-CM | POA: Diagnosis present

## 2021-08-07 DIAGNOSIS — K567 Ileus, unspecified: Secondary | ICD-10-CM | POA: Diagnosis not present

## 2021-08-07 LAB — CBC
HCT: 35.2 % — ABNORMAL LOW (ref 39.0–52.0)
Hemoglobin: 11.9 g/dL — ABNORMAL LOW (ref 13.0–17.0)
MCH: 28.2 pg (ref 26.0–34.0)
MCHC: 33.8 g/dL (ref 30.0–36.0)
MCV: 83.4 fL (ref 80.0–100.0)
Platelets: 328 10*3/uL (ref 150–400)
RBC: 4.22 MIL/uL (ref 4.22–5.81)
RDW: 12.7 % (ref 11.5–15.5)
WBC: 15.1 10*3/uL — ABNORMAL HIGH (ref 4.0–10.5)
nRBC: 0 % (ref 0.0–0.2)

## 2021-08-07 LAB — BASIC METABOLIC PANEL
Anion gap: 9 (ref 5–15)
BUN: 16 mg/dL (ref 6–20)
CO2: 24 mmol/L (ref 22–32)
Calcium: 8.9 mg/dL (ref 8.9–10.3)
Chloride: 104 mmol/L (ref 98–111)
Creatinine, Ser: 1.23 mg/dL (ref 0.61–1.24)
GFR, Estimated: >60 mL/min (ref >60)
Glucose, Bld: 152 mg/dL — ABNORMAL HIGH (ref 70–99)
Potassium: 3.3 mmol/L — ABNORMAL LOW (ref 3.5–5.1)
Sodium: 137 mmol/L (ref 135–145)

## 2021-08-07 LAB — LACTIC ACID, PLASMA: Lactic Acid, Venous: 1.1 mmol/L (ref 0.5–1.9)

## 2021-08-07 MED ORDER — ACETAMINOPHEN 10 MG/ML IV SOLN
1000.0000 mg | Freq: Four times a day (QID) | INTRAVENOUS | Status: AC | PRN
  Administered 2021-08-08: 1000 mg via INTRAVENOUS
  Filled 2021-08-07: qty 100

## 2021-08-07 MED ORDER — LACTATED RINGERS IV BOLUS
500.0000 mL | Freq: Once | INTRAVENOUS | Status: AC
  Administered 2021-08-07: 500 mL via INTRAVENOUS

## 2021-08-07 MED ORDER — ONDANSETRON HCL 4 MG/2ML IJ SOLN
4.0000 mg | Freq: Four times a day (QID) | INTRAMUSCULAR | Status: DC | PRN
  Administered 2021-08-07 – 2021-08-14 (×2): 4 mg via INTRAVENOUS
  Filled 2021-08-07 (×2): qty 2

## 2021-08-07 MED ORDER — ONDANSETRON HCL 4 MG PO TABS: 4.0000 mg | ORAL_TABLET | Freq: Four times a day (QID) | ORAL | Status: DC | PRN

## 2021-08-07 MED ORDER — POTASSIUM CHLORIDE 2 MEQ/ML IV SOLN
INTRAVENOUS | Status: AC
  Filled 2021-08-07 (×22): qty 1000

## 2021-08-07 MED ORDER — LACTATED RINGERS IV BOLUS: 1000.0000 mL | Freq: Once | INTRAVENOUS | Status: DC

## 2021-08-07 MED ORDER — BENZTROPINE MESYLATE 1 MG PO TABS
1.0000 mg | ORAL_TABLET | Freq: Every day | ORAL | Status: DC
  Administered 2021-08-07: 1 mg
  Filled 2021-08-07 (×2): qty 1

## 2021-08-07 MED ORDER — ENOXAPARIN SODIUM 40 MG/0.4ML IJ SOSY
40.0000 mg | PREFILLED_SYRINGE | INTRAMUSCULAR | Status: DC
  Administered 2021-08-07 – 2021-08-22 (×16): 40 mg via SUBCUTANEOUS
  Filled 2021-08-07 (×16): qty 0.4

## 2021-08-07 MED ORDER — PIPERACILLIN-TAZOBACTAM 3.375 G IVPB
3.3750 g | Freq: Three times a day (TID) | INTRAVENOUS | Status: DC
  Administered 2021-08-07 – 2021-08-09 (×7): 3.375 g via INTRAVENOUS
  Filled 2021-08-07 (×7): qty 50

## 2021-08-07 MED ORDER — LACTATED RINGERS IV SOLN: INTRAVENOUS | Status: DC

## 2021-08-07 MED ORDER — MORPHINE SULFATE (PF) 2 MG/ML IV SOLN
2.0000 mg | INTRAVENOUS | Status: DC | PRN
  Administered 2021-08-07: 4 mg via INTRAVENOUS
  Administered 2021-08-07: 2 mg via INTRAVENOUS
  Filled 2021-08-07: qty 2
  Filled 2021-08-07: qty 1

## 2021-08-07 NOTE — Progress Notes (Signed)
RN called to inform us that patient's mother was at the bedside.  I arrived and discussed with the mother the anatomy and physiology of his diagnosis as well as our recommendations and the procedure including risks and complications.  After we finished discussing all of this she asked to explain all of it over with her daughter who was in the waiting room.  We both then walked to the waiting area and stepped outside to discuss the above again.  Specifically we discussed, the anatomy and physiology of diverticulitis and what this means, along with the possibility of a malignancy.  I drew pictures showing normal anatomy, anatomy with diverticulosis as well as the effects of diverticulitis and stricturing.  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  We discussed that if his problem was just infection, it is likely that we could manage this non-operatively with just abx therapy; however, given his stricturing, recent weight loss etc, it was unlikely this would improve with the need for surgical intervention.  We discussed that would almost certainly be a laparotomy with a partial colectomy and creation of a colostomy.  Again I drew a picture of what this would look like and what his anatomy would be like afterward.  We discussed the possibility of reversal as well as the possibility that this may be permanent pending the pathology and future course.  We then discussed risks and complications of this procedure, including but not limited to, bleeding, infection, injury to surrounding structures not intended, anastomotic leak, post op abscess and need for IR drainage, risk of re-operation, fistulae formation and treatment for that, open wound requiring packing, fascial breakdown due to malnutrition and subsequent hernia formation, along with anesthesia risks including MI, CVA, and death.  The sister and mother had an opportunity to ask all questions.  All of these were answered  to the best of my ability.  They had no further questions.  We left the conversation with them needing to discuss all that we had spoken about and they will let the RN know how they would like to proceed.    ADDENDUM: I have received confirmation from the RN that family would like to proceed with surgery.  Given the OR schedule, we will plan to proceed with his case tomorrow, 1/6.  Henreitta Cea 2:15 PM 08/07/2021

## 2021-08-07 NOTE — Consult Note (Addendum)
CC/Reason for consult: Diverticulitis vs sigmoid neoplasm  Consulting Physician: Dr. Thayer Jew, MD  HPI: Matthew [name] is an 56 y.o. male with history of schizophrenia presented to the hospital today with a 7 to 10-day history of vague left lower quadrant pain.  He has a rather flat affect.  His mother is at bedside and helps with providing history.  He does report a history dating back at least a year of intermittent left lower quadrant pain that is similar to his current episode.  He describes the pain as crampy.  He has had associated nausea and vomiting with this most recent episode.  He reports that he has bowel movements every 1 to 3 days at baseline.  He denies any blood in his stool.  He denies any black or tarry stool.  His mother reports he is never had a colonoscopy but that he did have a stool study that required them to send a sample into the lab and reports being told that that was "normal."  With this current episode, he reports that the current pain he has been experiencing has been going on for at least a week.  He states that the current level of pain he is experiencing is the same he has been experiencing for the last 7 days.  Nothing is acutely changed.  But because it has not gotten better, he and his mother sought treatment.  His mother reports that she estimates he has lost somewhere between 30 and 40 pounds in the last year.  She reports his appetite has been poor for the last week.  He has had nothing to eat or drink today.  He had a CT scan also completed back in August 2021 which was done for abdominal pain and demonstrated segmental thickening of the mid to distal sigmoid with focal narrowing.  No acute surrounding inflammation or evident obstruction.  Findings are felt to be nonspecific and could reflect prior inflammation/diverticular stricture but recommend correlation with colonoscopy.  He has also had to right upper quadrant ultrasounds which have demonstrated  gallstones.  History reviewed. No pertinent past medical history.  History reviewed. No pertinent surgical history.  History reviewed. No pertinent family history.  Social:  reports that he has never smoked. He does not have any smokeless tobacco history on file. He reports that he does not drink alcohol. No history on file for drug use.  Allergies: No Known Allergies  Medications: I have reviewed the patient's current medications.  Results for orders placed or performed during the hospital encounter of 08/06/21 (from the past 48 hour(s))  Resp Panel by RT-PCR (Flu A&B, Covid) Nasopharyngeal Swab     Status: None   Collection Time: 08/06/21  7:06 PM   Specimen: Nasopharyngeal Swab; Nasopharyngeal(NP) swabs in vial transport medium  Result Value Ref Range   SARS Coronavirus 2 by RT PCR NEGATIVE NEGATIVE    Comment: (NOTE) SARS-CoV-2 target nucleic acids are NOT DETECTED.  The SARS-CoV-2 RNA is generally detectable in upper respiratory specimens during the acute phase of infection. The lowest concentration of SARS-CoV-2 viral copies this assay can detect is 138 copies/mL. A negative result does not preclude SARS-Cov-2 infection and should not be used as the sole basis for treatment or other patient management decisions. A negative result may occur with  improper specimen collection/handling, submission of specimen other than nasopharyngeal swab, presence of viral mutation(s) within the areas targeted by this assay, and inadequate number of viral copies(<138 copies/mL). A negative result must  be combined with clinical observations, patient history, and epidemiological information. The expected result is Negative.  Fact Sheet for Patients:  EntrepreneurPulse.com.au  Fact Sheet for Healthcare Providers:  IncredibleEmployment.be  This test is no t yet approved or cleared by the [place] FDA and  has been authorized for detection and/or  diagnosis of SARS-CoV-2 by FDA under an Emergency Use Authorization (EUA). This EUA will remain  in effect (meaning this test can be used) for the duration of the COVID-19 declaration under Section 564(b)(1) of the Act, 21 U.S.C.section 360bbb-3(b)(1), unless the authorization is terminated  or revoked sooner.       Influenza A by PCR NEGATIVE NEGATIVE   Influenza B by PCR NEGATIVE NEGATIVE    Comment: (NOTE) The Xpert Xpress SARS-CoV-2/FLU/RSV plus assay is intended as an aid in the diagnosis of influenza from Nasopharyngeal swab specimens and should not be used as a sole basis for treatment. Nasal washings and aspirates are unacceptable for Xpert Xpress SARS-CoV-2/FLU/RSV testing.  Fact Sheet for Patients: EntrepreneurPulse.com.au  Fact Sheet for Healthcare Providers: IncredibleEmployment.be  This test is not yet approved or cleared by the [place] FDA and has been authorized for detection and/or diagnosis of SARS-CoV-2 by FDA under an Emergency Use Authorization (EUA). This EUA will remain in effect (meaning this test can be used) for the duration of the COVID-19 declaration under Section 564(b)(1) of the Act, 21 U.S.C. section 360bbb-3(b)(1), unless the authorization is terminated or revoked.  Performed at [facility], [place] [address] [place], [place] [address]   Lipase, blood     Status: None   Collection Time: 08/06/21  7:22 PM  Result Value Ref Range   Lipase 22 11 - 51 U/L    Comment: Performed at [facility] [address], [place], [place] [address]  Comprehensive metabolic panel     Status: Abnormal   Collection Time: 08/06/21  7:22 PM  Result Value Ref Range   Sodium 137 135 - 145 mmol/L   Potassium 3.5 3.5 - 5.1 mmol/L   Chloride 100 98 - 111 mmol/L   CO2 23 22 - 32 mmol/L   Glucose, Bld 165 (H) 70 - 99 mg/dL    Comment: Glucose reference range applies only to samples taken after fasting for at least 8  hours.   BUN 14 6 - 20 mg/dL   Creatinine, Ser 1.48 (H) 0.61 - 1.24 mg/dL   Calcium 9.4 8.9 - 10.3 mg/dL   Total Protein 8.0 6.5 - 8.1 g/dL   Albumin 3.7 3.5 - 5.0 g/dL   AST 38 15 - 41 U/L   ALT 39 0 - 44 U/L   Alkaline Phosphatase 108 38 - 126 U/L   Total Bilirubin 3.8 (H) 0.3 - 1.2 mg/dL   GFR, Estimated 56 (L) >60 mL/min    Comment: (NOTE) Calculated using the CKD-EPI Creatinine Equation (2021)    Anion gap 14 5 - 15    Comment: Performed at [facility] [address], [place],  [address]  CBC with Differential     Status: Abnormal   Collection Time: 08/06/21  7:22 PM  Result Value Ref Range   WBC 11.4 (H) 4.0 - 10.5 K/uL   RBC 4.50 4.22 - 5.81 MIL/uL   Hemoglobin 12.4 (L) 13.0 - 17.0 g/dL   HCT 38.8 (L) 39.0 - 52.0 %   MCV 86.2 80.0 - 100.0 fL   MCH 27.6 26.0 - 34.0 pg   MCHC 32.0  30.0 - 36.0 g/dL   RDW 12.8 11.5 - 15.5 %   Platelets 353 150 - 400 K/uL   nRBC 0.0 0.0 - 0.2 %   Neutrophils Relative % 94 %   Neutro Abs 10.7 (H) 1.7 - 7.7 K/uL   Lymphocytes Relative 2 %   Lymphs Abs 0.2 (L) 0.7 - 4.0 K/uL   Monocytes Relative 4 %   Monocytes Absolute 0.4 0.1 - 1.0 K/uL   Eosinophils Relative 0 %   Eosinophils Absolute 0.0 0.0 - 0.5 K/uL   Basophils Relative 0 %   Basophils Absolute 0.0 0.0 - 0.1 K/uL   Immature Granulocytes 0 %   Abs Immature Granulocytes 0.04 0.00 - 0.07 K/uL    Comment: Performed at [facility] [address] [place], [place] [address]  Lactic acid, plasma     Status: Abnormal   Collection Time: 08/06/21  7:22 PM  Result Value Ref Range   Lactic Acid, Venous 3.0 (HH) 0.5 - 1.9 mmol/L    Comment: CRITICAL RESULT CALLED TO, READ BACK BY AND VERIFIED WITH: Clarisa Schools 2054 08/06/2021 WBOND Performed at [facility], [place] [address] [place], [place] [address]   Lactic acid, plasma     Status: Abnormal   Collection Time: 08/06/21  9:06 PM  Result Value Ref Range   Lactic Acid, Venous 2.4 (HH) 0.5 - 1.9 mmol/L     Comment: CRITICAL VALUE NOTED.  VALUE IS CONSISTENT WITH PREVIOUSLY REPORTED AND CALLED VALUE. Performed at [facility], [place] [address], [place], [place] [address]     CT ABDOMEN PELVIS W CONTRAST  Result Date: 08/07/2021 CLINICAL DATA:  Left lower quadrant abdominal pain, nausea, vomiting EXAM: CT ABDOMEN AND PELVIS WITH CONTRAST TECHNIQUE: Multidetector CT imaging of the abdomen and pelvis was performed using the standard protocol following bolus administration of intravenous contrast. CONTRAST:  103mL OMNIPAQUE IOHEXOL 350 MG/ML SOLN COMPARISON:  None. FINDINGS: Lower chest: The visualized lung bases are clear. Moderate coronary artery calcification. There is distention of the distal esophagus with fluid which likely relates to gastroesophageal reflux given the patient's history of vomiting. Hepatobiliary: Mural calcification of the gallbladder is noted. No associated mass. The gallbladder is not distended. The liver is unremarkable. No intra or extrahepatic biliary ductal dilation. Pancreas: Unremarkable Spleen: Unremarkable Adrenals/Urinary Tract: The adrenal glands are unremarkable. The kidneys are normal in size and position. Simple cortical cysts are seen bilaterally. The kidneys are otherwise unremarkable. The bladder is unremarkable. Stomach/Bowel: There is long segment marked circumferential thickening and pericolonic inflammatory of the sigmoid colon. Within the mid sigmoid colon, the inflammatory changes abruptly terminate adjacent to several mural calcifications and infiltrative soft tissue within the adjacent retroperitoneum. This appears progressive since prior examination. The sigmoid colon demonstrates focal perforation in this location with a small focus of extraluminal gas. There is a second site of perforation, slightly distally which appears to track to a contained gas and fluid collection within the colonic mesentery measuring 7.1 x 4.1 cm on coronal image # 43/7. The 2  perforations are best appreciated on coronal image # 52/7. There is mild free intraperitoneal gas noted within the abdomen. The terminal ileum is seen immediately adjacent to the sigmoid colonic mesenteric collection and demonstrates marked wall thickening and hyperemia, likely related to the adjacent inflammatory process. There is a resultant distal small bowel obstruction with multiple dilated fluid-filled loops of small bowel proximally. Small free fluid within the pelvis. Infiltration within the mesentery and omentum  is in keeping with changes of peritonitis. Vascular/Lymphatic: There is a a pathologically enlarged lymph node within the aortocaval lymph node group measuring 14.5 cm in diameter mildly enlarged since prior examination. No additional pathologic adenopathy within the abdomen and pelvis. Mild aortoiliac atherosclerotic calcification. The abdominal vasculature is otherwise unremarkable. Reproductive: Mild prostatic enlargement. Other: Small broad-based fat containing umbilical hernia. Musculoskeletal: No acute bone abnormality. IMPRESSION: Sigmoid colonic perforation with small free intraperitoneal gas and infiltration of the mesenteric and omental fat in keeping with changes of peritonitis. Long segment inflammatory stranding of the sigmoid colon in keeping with a severe infectious or inflammatory colitis. This terminates an area of irregular mural thickening, infiltrative soft tissue within the colonic mesentery, and focal dystrophic calcification. This may represent a chronic inflammatory process, however, a perforated malignancy could appear similarly. There are 2 separate points of perforation which again raise the question of an underlying malignancy. 7.1 cm gas and fluid containing pericolonic abscess within the sigmoid mesentery. Marked inflammatory change of the terminal ileum adjacent to the pericolonic abscess with resultant small bowel obstruction. Fluid within the distal esophagus likely  relates to gastroesophageal reflux the setting of vomiting. Aortic Atherosclerosis (ICD10-I70.0). Electronically Signed   By: Fidela Salisbury M.D.   On: 08/07/2021 00:15    ROS - all of the below systems have been reviewed with the patient and positives are indicated with bold text General: chills, fever or night sweats Eyes: blurry vision or double vision ENT: epistaxis or sore throat Allergy/Immunology: itchy/watery eyes or nasal congestion Hematologic/Lymphatic: bleeding problems, blood clots or swollen lymph nodes Endocrine: temperature intolerance or unexpected weight changes Breast: new or changing breast lumps or nipple discharge Resp: cough, shortness of breath, or wheezing CV: chest pain or dyspnea on exertion GI: as per HPI GU: dysuria, trouble voiding, or hematuria MSK: joint pain or joint stiffness Neuro: TIA or stroke symptoms Derm: pruritus and skin lesion changes Psych: anxiety and depression  PE Blood pressure 115/72, pulse 99, temperature 98.5 F (36.9 C), temperature source Oral, resp. Rate 20, SpO2 100 %.  Constitutional: NAD; conversant; no deformities; wearing mask Eyes: Moist conjunctiva; no lid lag; anicteric; PERRL Neck: Trachea midline; no thyromegaly Lungs: Normal respiratory effort; no tactile fremitus CV: RRR; no palpable thrills; no pitting edema GI: Abd soft, mildly tender to palpation in LLQ, not significantly distended; no tenderness in upper abdomen; no rebound nor guarding; no palpable hepatosplenomegaly MSK: Normal range of motion of extremities; no clubbing/cyanosis Psychiatric: flat affect; alert and oriented x3 Lymphatic: No palpable cervical or axillary lymphadenopathy  Results for orders placed or performed during the hospital encounter of 08/06/21 (from the past 48 hour(s))  Resp Panel by RT-PCR (Flu A&B, Covid) Nasopharyngeal Swab     Status: None   Collection Time: 08/06/21  7:06 PM   Specimen: Nasopharyngeal Swab; Nasopharyngeal(NP) swabs  in vial transport medium  Result Value Ref Range   SARS Coronavirus 2 by RT PCR NEGATIVE NEGATIVE    Comment: (NOTE) SARS-CoV-2 target nucleic acids are NOT DETECTED.  The SARS-CoV-2 RNA is generally detectable in upper respiratory specimens during the acute phase of infection. The lowest concentration of SARS-CoV-2 viral copies this assay can detect is 138 copies/mL. A negative result does not preclude SARS-Cov-2 infection and should not be used as the sole basis for treatment or other patient management decisions. A negative result may occur with  improper specimen collection/handling, submission of specimen other than nasopharyngeal swab, presence of viral mutation(s) within the areas targeted by this  assay, and inadequate number of viral copies(<138 copies/mL). A negative result must be combined with clinical observations, patient history, and epidemiological information. The expected result is Negative.  Fact Sheet for Patients:  EntrepreneurPulse.com.au  Fact Sheet for Healthcare Providers:  IncredibleEmployment.be  This test is no t yet approved or cleared by the [place] FDA and  has been authorized for detection and/or diagnosis of SARS-CoV-2 by FDA under an Emergency Use Authorization (EUA). This EUA will remain  in effect (meaning this test can be used) for the duration of the COVID-19 declaration under Section 564(b)(1) of the Act, 21 U.S.C.section 360bbb-3(b)(1), unless the authorization is terminated  or revoked sooner.       Influenza A by PCR NEGATIVE NEGATIVE   Influenza B by PCR NEGATIVE NEGATIVE    Comment: (NOTE) The Xpert Xpress SARS-CoV-2/FLU/RSV plus assay is intended as an aid in the diagnosis of influenza from Nasopharyngeal swab specimens and should not be used as a sole basis for treatment. Nasal washings and aspirates are unacceptable for Xpert Xpress SARS-CoV-2/FLU/RSV testing.  Fact Sheet for  Patients: EntrepreneurPulse.com.au  Fact Sheet for Healthcare Providers: IncredibleEmployment.be  This test is not yet approved or cleared by the [place] FDA and has been authorized for detection and/or diagnosis of SARS-CoV-2 by FDA under an Emergency Use Authorization (EUA). This EUA will remain in effect (meaning this test can be used) for the duration of the COVID-19 declaration under Section 564(b)(1) of the Act, 21 U.S.C. section 360bbb-3(b)(1), unless the authorization is terminated or revoked.  Performed at [facility], [place] [address], [place], [place] [address]   Lipase, blood     Status: None   Collection Time: 08/06/21  7:22 PM  Result Value Ref Range   Lipase 22 11 - 51 U/L    Comment: Performed at [facility] [address], [place], [place] [address]  Comprehensive metabolic panel     Status: Abnormal   Collection Time: 08/06/21  7:22 PM  Result Value Ref Range   Sodium 137 135 - 145 mmol/L   Potassium 3.5 3.5 - 5.1 mmol/L   Chloride 100 98 - 111 mmol/L   CO2 23 22 - 32 mmol/L   Glucose, Bld 165 (H) 70 - 99 mg/dL    Comment: Glucose reference range applies only to samples taken after fasting for at least 8 hours.   BUN 14 6 - 20 mg/dL   Creatinine, Ser 1.48 (H) 0.61 - 1.24 mg/dL   Calcium 9.4 8.9 - 10.3 mg/dL   Total Protein 8.0 6.5 - 8.1 g/dL   Albumin 3.7 3.5 - 5.0 g/dL   AST 38 15 - 41 U/L   ALT 39 0 - 44 U/L   Alkaline Phosphatase 108 38 - 126 U/L   Total Bilirubin 3.8 (H) 0.3 - 1.2 mg/dL   GFR, Estimated 56 (L) >60 mL/min    Comment: (NOTE) Calculated using the CKD-EPI Creatinine Equation (2021)    Anion gap 14 5 - 15    Comment: Performed at [facility] [address], [place], [place] [address]  CBC with Differential     Status: Abnormal   Collection Time: 08/06/21  7:22 PM  Result Value Ref Range   WBC 11.4 (H) 4.0 - 10.5 K/uL   RBC 4.50 4.22 - 5.81 MIL/uL   Hemoglobin 12.4 (L)  13.0 - 17.0 g/dL   HCT 38.8 (L) 39.0 - 52.0 %   MCV 86.2 80.0 - 100.0 fL  MCH 27.6 26.0 - 34.0 pg   MCHC 32.0 30.0 - 36.0 g/dL   RDW 12.8 11.5 - 15.5 %   Platelets 353 150 - 400 K/uL   nRBC 0.0 0.0 - 0.2 %   Neutrophils Relative % 94 %   Neutro Abs 10.7 (H) 1.7 - 7.7 K/uL   Lymphocytes Relative 2 %   Lymphs Abs 0.2 (L) 0.7 - 4.0 K/uL   Monocytes Relative 4 %   Monocytes Absolute 0.4 0.1 - 1.0 K/uL   Eosinophils Relative 0 %   Eosinophils Absolute 0.0 0.0 - 0.5 K/uL   Basophils Relative 0 %   Basophils Absolute 0.0 0.0 - 0.1 K/uL   Immature Granulocytes 0 %   Abs Immature Granulocytes 0.04 0.00 - 0.07 K/uL    Comment: Performed at [facility] [address], [place], [place] [address]  Lactic acid, plasma     Status: Abnormal   Collection Time: 08/06/21  7:22 PM  Result Value Ref Range   Lactic Acid, Venous 3.0 (HH) 0.5 - 1.9 mmol/L    Comment: CRITICAL RESULT CALLED TO, READ BACK BY AND VERIFIED WITH: Clarisa Schools 2054 08/06/2021 WBOND Performed at [facility], [place] [address] [place], [place] [address]   Lactic acid, plasma     Status: Abnormal   Collection Time: 08/06/21  9:06 PM  Result Value Ref Range   Lactic Acid, Venous 2.4 (HH) 0.5 - 1.9 mmol/L    Comment: CRITICAL VALUE NOTED.  VALUE IS CONSISTENT WITH PREVIOUSLY REPORTED AND CALLED VALUE. Performed at [facility], [place] [address] [place], [place] [address]     CT ABDOMEN PELVIS W CONTRAST  Result Date: 08/07/2021 CLINICAL DATA:  Left lower quadrant abdominal pain, nausea, vomiting EXAM: CT ABDOMEN AND PELVIS WITH CONTRAST TECHNIQUE: Multidetector CT imaging of the abdomen and pelvis was performed using the standard protocol following bolus administration of intravenous contrast. CONTRAST:  60mL OMNIPAQUE IOHEXOL 350 MG/ML SOLN COMPARISON:  None. FINDINGS: Lower chest: The visualized lung bases are clear. Moderate coronary artery calcification. There is distention of the distal esophagus with  fluid which likely relates to gastroesophageal reflux given the patient's history of vomiting. Hepatobiliary: Mural calcification of the gallbladder is noted. No associated mass. The gallbladder is not distended. The liver is unremarkable. No intra or extrahepatic biliary ductal dilation. Pancreas: Unremarkable Spleen: Unremarkable Adrenals/Urinary Tract: The adrenal glands are unremarkable. The kidneys are normal in size and position. Simple cortical cysts are seen bilaterally. The kidneys are otherwise unremarkable. The bladder is unremarkable. Stomach/Bowel: There is long segment marked circumferential thickening and pericolonic inflammatory of the sigmoid colon. Within the mid sigmoid colon, the inflammatory changes abruptly terminate adjacent to several mural calcifications and infiltrative soft tissue within the adjacent retroperitoneum. This appears progressive since prior examination. The sigmoid colon demonstrates focal perforation in this location with a small focus of extraluminal gas. There is a second site of perforation, slightly distally which appears to track to a contained gas and fluid collection within the colonic mesentery measuring 7.1 x 4.1 cm on coronal image # 43/7. The 2 perforations are best appreciated on coronal image # 52/7. There is mild free intraperitoneal gas noted within the abdomen. The terminal ileum is seen immediately adjacent to the sigmoid colonic mesenteric collection and demonstrates marked wall thickening and hyperemia, likely related to the adjacent inflammatory process. There is a resultant distal small bowel obstruction with multiple dilated fluid-filled loops of small bowel proximally. Small free fluid  within the pelvis. Infiltration within the mesentery and omentum is in keeping with changes of peritonitis. Vascular/Lymphatic: There is a a pathologically enlarged lymph node within the aortocaval lymph node group measuring 14.5 cm in diameter mildly enlarged since prior  examination. No additional pathologic adenopathy within the abdomen and pelvis. Mild aortoiliac atherosclerotic calcification. The abdominal vasculature is otherwise unremarkable. Reproductive: Mild prostatic enlargement. Other: Small broad-based fat containing umbilical hernia. Musculoskeletal: No acute bone abnormality. IMPRESSION: Sigmoid colonic perforation with small free intraperitoneal gas and infiltration of the mesenteric and omental fat in keeping with changes of peritonitis. Long segment inflammatory stranding of the sigmoid colon in keeping with a severe infectious or inflammatory colitis. This terminates an area of irregular mural thickening, infiltrative soft tissue within the colonic mesentery, and focal dystrophic calcification. This may represent a chronic inflammatory process, however, a perforated malignancy could appear similarly. There are 2 separate points of perforation which again raise the question of an underlying malignancy. 7.1 cm gas and fluid containing pericolonic abscess within the sigmoid mesentery. Marked inflammatory change of the terminal ileum adjacent to the pericolonic abscess with resultant small bowel obstruction. Fluid within the distal esophagus likely relates to gastroesophageal reflux the setting of vomiting. Aortic Atherosclerosis (ICD10-I70.0). Electronically Signed   By: Fidela Salisbury M.D.   On: 08/07/2021 00:15    I have personally reviewed the relevant CT scan dated 08/06/20 and additionally the CT dated 04/01/20  A/P: Matthew [name] is an 56 y.o. male with schizophrenia here with potentially perforated diverticulitis with abscess  WBC 11 Lactate 3.0 --> 2.4 Tachycardia improved with IVF; addn'l 500 cc bolus ordered; normotensive  -Admit to progressive/4np for monitoring -NPO, NG tube to low intermittent wall suction -MIVF -IV Zosyn -SCDs; ok for chemical dvt prophylaxis -Additional treatment decisions to be made based upon his response to our initial  treatment -I spent time reviewing his CT scan and going over everything with both him and his mother.  His mother reports that she makes his healthcare related decisions. He lives with his mother. She has a daughter who also likes to be involved but was unable to be reached via phone this evening.  We discussed the CT findings including the potential for diverticulitis with perisigmoid abscess and a small amount of free intraperitoneal air.  We also discussed that the etiology could be related to a neoplasm as has been brought up on his previous imaging studies.  He currently exhibits no physical exam findings to suggest peritonitis and with his constellation of findings as well as his symptomatology, I suspect this has been going on for at least 5 to 7 days.  Tentatively, if he is responding appropriately to treatment, potentially discussing the case with interventional radiology.  If he were to fail to improve or acutely worsen, we discussed possibility of surgery this admission and what that would entail.  We discussed this would involve a colostomy and what that means.  I spent a total of 80 minutes in both face-to-face and non-face-to-face activities, excluding procedures performed, for this consultation on the date of this encounter.  Nadeen Landau, MD [facility], [facility]

## 2021-08-07 NOTE — ED Notes (Signed)
Mother at bedside.

## 2021-08-07 NOTE — ED Notes (Signed)
Mother came out and stated they had reached the decision to go ahead with surgery. Provider informed.

## 2021-08-07 NOTE — Sepsis Progress Note (Signed)
Following per sepsis protocol   

## 2021-08-07 NOTE — ED Notes (Signed)
Verified with provider that patient could have ice chips. Tolerated well. Urinal emptied of 445ml dark amber urine

## 2021-08-07 NOTE — Progress Notes (Signed)
Pharmacy Antibiotic Note  Matthew [name] is a 56 y.o. male admitted on 08/06/2021 with  perforated sigmoid colon w/ abscess .  Pharmacy has been consulted for Zosyn dosing.  Plan: Zosyn 3.375g IV q8h (4 hour infusion).  Temp (24hrs), Avg:99.2 F (37.3 C), Min:98.5 F (36.9 C), Max:99.8 F (37.7 C)  Recent Labs  Lab 08/06/21 1922 08/06/21 2106  WBC 11.4*  --   CREATININE 1.48*  --   LATICACIDVEN 3.0* 2.4*     No Known Allergies   Thank you for allowing pharmacy to be a part of this patients care.  Wynona Neat, PharmD, BCPS  08/07/2021 5:43 AM

## 2021-08-07 NOTE — Progress Notes (Signed)
Subjective/Chief Complaint: Not able to give a full coherent history, but able to answer yes or no questions and denies significant abdominal pain   Objective: Vital signs in last 24 hours: Temp:  [98.5 F (36.9 C)-99.8 F (37.7 C)] 98.5 F (36.9 C) (01/04 2021) Pulse Rate:  [104-137] 123 (01/05 0556) Resp:  [15-35] 18 (01/05 0556) BP: (93-127)/(63-98) 111/75 (01/05 0556) SpO2:  [77 %-100 %] 99 % (01/05 0556)    Intake/Output from previous day: No intake/output data recorded. Intake/Output this shift: No intake/output data recorded.  Alert, interactive, but only really answers yes or no questions Unlabored respirations Abdomen is soft, mildly distended, mildly tender in the lower fields.  NG tube with bilious output, not high-volume at this point No lower extremity edema  Lab Results:  Recent Labs    08/06/21 1922 08/07/21 0537  WBC 11.4* 15.1*  HGB 12.4* 11.9*  HCT 38.8* 35.2*  PLT 353 328   BMET Recent Labs    08/06/21 1922 08/07/21 0537  NA 137 137  K 3.5 3.3*  CL 100 104  CO2 23 24  GLUCOSE 165* 152*  BUN 14 16  CREATININE 1.48* 1.23  CALCIUM 9.4 8.9   PT/INR No results for input(s): LABPROT, INR in the last 72 hours. ABG No results for input(s): PHART, HCO3 in the last 72 hours.  Invalid input(s): PCO2, PO2  Studies/Results: CT ABDOMEN PELVIS W CONTRAST  Result Date: 08/07/2021 CLINICAL DATA:  Left lower quadrant abdominal pain, nausea, vomiting EXAM: CT ABDOMEN AND PELVIS WITH CONTRAST TECHNIQUE: Multidetector CT imaging of the abdomen and pelvis was performed using the standard protocol following bolus administration of intravenous contrast. CONTRAST:  60mL OMNIPAQUE IOHEXOL 350 MG/ML SOLN COMPARISON:  None. FINDINGS: Lower chest: The visualized lung bases are clear. Moderate coronary artery calcification. There is distention of the distal esophagus with fluid which likely relates to gastroesophageal reflux given the patient's history of  vomiting. Hepatobiliary: Mural calcification of the gallbladder is noted. No associated mass. The gallbladder is not distended. The liver is unremarkable. No intra or extrahepatic biliary ductal dilation. Pancreas: Unremarkable Spleen: Unremarkable Adrenals/Urinary Tract: The adrenal glands are unremarkable. The kidneys are normal in size and position. Simple cortical cysts are seen bilaterally. The kidneys are otherwise unremarkable. The bladder is unremarkable. Stomach/Bowel: There is long segment marked circumferential thickening and pericolonic inflammatory of the sigmoid colon. Within the mid sigmoid colon, the inflammatory changes abruptly terminate adjacent to several mural calcifications and infiltrative soft tissue within the adjacent retroperitoneum. This appears progressive since prior examination. The sigmoid colon demonstrates focal perforation in this location with a small focus of extraluminal gas. There is a second site of perforation, slightly distally which appears to track to a contained gas and fluid collection within the colonic mesentery measuring 7.1 x 4.1 cm on coronal image # 43/7. The 2 perforations are best appreciated on coronal image # 52/7. There is mild free intraperitoneal gas noted within the abdomen. The terminal ileum is seen immediately adjacent to the sigmoid colonic mesenteric collection and demonstrates marked wall thickening and hyperemia, likely related to the adjacent inflammatory process. There is a resultant distal small bowel obstruction with multiple dilated fluid-filled loops of small bowel proximally. Small free fluid within the pelvis. Infiltration within the mesentery and omentum is in keeping with changes of peritonitis. Vascular/Lymphatic: There is a a pathologically enlarged lymph node within the aortocaval lymph node group measuring 14.5 cm in diameter mildly enlarged since prior examination. No additional pathologic adenopathy within  the abdomen and pelvis. Mild  aortoiliac atherosclerotic calcification. The abdominal vasculature is otherwise unremarkable. Reproductive: Mild prostatic enlargement. Other: Small broad-based fat containing umbilical hernia. Musculoskeletal: No acute bone abnormality. IMPRESSION: Sigmoid colonic perforation with small free intraperitoneal gas and infiltration of the mesenteric and omental fat in keeping with changes of peritonitis. Long segment inflammatory stranding of the sigmoid colon in keeping with a severe infectious or inflammatory colitis. This terminates an area of irregular mural thickening, infiltrative soft tissue within the colonic mesentery, and focal dystrophic calcification. This may represent a chronic inflammatory process, however, a perforated malignancy could appear similarly. There are 2 separate points of perforation which again raise the question of an underlying malignancy. 7.1 cm gas and fluid containing pericolonic abscess within the sigmoid mesentery. Marked inflammatory change of the terminal ileum adjacent to the pericolonic abscess with resultant small bowel obstruction. Fluid within the distal esophagus likely relates to gastroesophageal reflux the setting of vomiting. Aortic Atherosclerosis (ICD10-I70.0). Electronically Signed   By: Fidela Salisbury M.D.   On: 08/07/2021 00:15   DG Abd Portable 1V  Result Date: 08/07/2021 CLINICAL DATA:  Nasogastric tube placement EXAM: PORTABLE ABDOMEN - 1 VIEW COMPARISON:  None. FINDINGS: Nasogastric tube tip overlies the proximal body of the stomach. Visualized abdominal gas pattern is nonobstructive. No gross free intraperitoneal gas. IMPRESSION: Nasogastric tube tip within the proximal body of the stomach. Electronically Signed   By: Fidela Salisbury M.D.   On: 08/07/2021 02:22    Anti-infectives: Anti-infectives (From admission, onward)    Start     Dose/Rate Route Frequency Ordered Stop   08/07/21 0600  piperacillin-tazobactam (ZOSYN) IVPB 3.375 g        3.375 g 12.5  mL/hr over 240 Minutes Intravenous Every 8 hours 08/07/21 0544     08/06/21 2100  piperacillin-tazobactam (ZOSYN) IVPB 3.375 g        3.375 g 12.5 mL/hr over 240 Minutes Intravenous  Once 08/06/21 2057 08/07/21 0118       Assessment/Plan: Perforated diverticulitis (neoplasm not excluded) with obstruction Lactate is normalized but white count is increasing.  Persistent tachycardia Benign abdominal exam  I reviewed his CT scan personally.  Given his history, appearance on CT scan and obstructive symptoms, I do not think this is going to resolve with nonoperative treatment.  The abscess does not appear drainable based on location.  I recommend proceeding with exploratory laparotomy, segmental colectomy and end colostomy, high likelihood of needing a small bowel resection as well.  Given that he has had symptoms for at least a week at this point he is high risk for bowel injury and subsequent leak or fistula as well as ileus, in addition to the fact that he is on anticholinergics for his psychiatric history.  I attempted to call his mother to discuss this with her but she seemed a bit confused by phone, she and her daughter will be coming to the hospital later this morning so we will try to get have a conversation with them at that time.  In the meantime, we will consult IR in case they feel differently about whether this abscess can be drained.  I think regardless of that he has some inflammatory tethering contributing to the obstruction which is unlikely to resolve on its own.   LOS: 0 days    Clovis Riley 08/07/2021

## 2021-08-07 NOTE — Progress Notes (Signed)
Pt arrived from ED, VSS, CHG complete, oriented to unit, call light within reach, CCMD activated.   Chrisandra Carota, RN 08/07/2021 6:49 PM

## 2021-08-07 NOTE — ED Notes (Signed)
NGT advanced by 25cm per provider recommendation. Tolerated well. Draining to lws

## 2021-08-07 NOTE — Progress Notes (Signed)
Request to IR for possible percutaneous intra-abdominal abscess aspiration/possible drain placement -- patient history and imaging reviewed by Dr. Anselm Pancoast who does not feel that the location of the gas and fluid collection is amenable to safe percutaneous drainage.  Discussed above with Dr. Kae Heller. Order will be deleted - please place a new order or call IR if it is felt this patient would benefit from re-examination.  Candiss Norse, PA-C

## 2021-08-07 NOTE — H&P (Addendum)
History and Physical    Matthew [name] WJX:[id number] DOB: September 18, 1965 DOA: 08/06/2021  PCP: Medicine, [facility]  Patient coming from: Home  I have personally briefly reviewed patient's old medical records in [place]  Chief Complaint: Abd pain  HPI: Matthew [name] is a 56 y.o. male with medical history significant of schizophrenia.  Pt presents to ED with c/o abd pain.  Ongoing for past 1 week, located in periumbilical area.  Associated vomiting.  No diarrhea nor constipation.  Pts mother says he first mentioned the pain today.  No cough no fever.  Per mother: He was not moving normally and just did not look well.  This prompted them to come to the emergency department for evaluation.  Also didn't eat or drink anything.   ED Course: CT AP reveals: 1) 2 sites of sigmoid perforation, diverticulitis vs neoplastic 2) 7.1cm abscess 3) abscess is causing inflammation of terminal ileum which in turn is causing SBO 4) small amount of free air  Pt started on zosyn.  Gen surg consulted, wants medicine admission.   Review of Systems: As per HPI, otherwise all review of systems negative.  Past Medical History:  Diagnosis Date   Schizophrenia [facility])     History reviewed. No pertinent surgical history.   reports that he has never smoked. He does not have any smokeless tobacco history on file. He reports that he does not drink alcohol. No history on file for drug use.  No Known Allergies  Family History  Problem Relation Age of Onset   Colon cancer Neg Hx      Prior to Admission medications   Medication Sig Start Date End Date Taking? Authorizing Provider  aspirin EC 81 MG tablet Take 81 mg by mouth daily as needed (for pain or headaches).    Yes [provider]  benztropine (COGENTIN) 1 MG tablet Take 1 mg at bedtime by mouth.   Yes [provider]  cetirizine (ZYRTEC) 10 MG tablet Take 10 mg by mouth daily as needed for allergies.    Yes [provider]  paliperidone (INVEGA SUSTENNA) 156 MG/ML SUSP injection Inject 156 mg every 30 (thirty) days into the muscle.   Yes [provider]    Physical Exam: Vitals:   08/07/21 0245 08/07/21 0300 08/07/21 0315 08/07/21 0330  BP: 117/75 115/66 124/84 (!) 120/98  Pulse: (!) 127 (!) 127 (!) 124 (!) 127  Resp: (!) 28 (!) 28 (!) 27 (!) 32  Temp:      TempSrc:      SpO2: 98% 96% 98% 100%    Constitutional: uncomfortable Eyes: PERRL, lids and conjunctivae normal ENMT: Mucous membranes are moist. Posterior pharynx clear of any exudate or lesions.Normal dentition.  Neck: normal, supple, no masses, no thyromegaly Respiratory: clear to auscultation bilaterally, no wheezing, no crackles. Normal respiratory effort. No accessory muscle use.  Cardiovascular: Tachycardic Abdomen: Periumbilical and LLQ TTP Musculoskeletal: no clubbing / cyanosis. No joint deformity upper and lower extremities. Good ROM, no contractures. Normal muscle tone.  Skin: no rashes, lesions, ulcers. No induration Neurologic: CN 2-12 grossly intact. Sensation intact, DTR normal. Strength 5/5 in all 4.  Psychiatric: Flat affect, delayed.   Labs on Admission: I have personally reviewed following labs and imaging studies  CBC: Recent Labs  Lab 08/06/21 1922  WBC 11.4*  NEUTROABS 10.7*  HGB 12.4*  HCT 38.8*  MCV 86.2  PLT 213   Basic Metabolic Panel: Recent Labs  Lab 08/06/21 1922  NA  137  K 3.5  CL 100  CO2 23  GLUCOSE 165*  BUN 14  CREATININE 1.48*  CALCIUM 9.4   GFR: CrCl cannot be calculated (Unknown ideal weight.). Liver Function Tests: Recent Labs  Lab 08/06/21 1922  AST 38  ALT 39  ALKPHOS 108  BILITOT 3.8*  PROT 8.0  ALBUMIN 3.7   Recent Labs  Lab 08/06/21 1922  LIPASE 22   No results for input(s): AMMONIA in the last 168 hours. Coagulation Profile: No results for input(s): INR, PROTIME in the last 168 hours. Cardiac Enzymes: No results for input(s):  CKTOTAL, CKMB, CKMBINDEX, TROPONINI in the last 168 hours. BNP (last 3 results) No results for input(s): PROBNP in the last 8760 hours. HbA1C: No results for input(s): HGBA1C in the last 72 hours. CBG: No results for input(s): GLUCAP in the last 168 hours. Lipid Profile: No results for input(s): CHOL, HDL, LDLCALC, TRIG, CHOLHDL, LDLDIRECT in the last 72 hours. Thyroid Function Tests: No results for input(s): TSH, T4TOTAL, FREET4, T3FREE, THYROIDAB in the last 72 hours. Anemia Panel: No results for input(s): VITAMINB12, FOLATE, FERRITIN, TIBC, IRON, RETICCTPCT in the last 72 hours. Urine analysis:    Component Value Date/Time   COLORURINE YELLOW 04/01/2020 1703   APPEARANCEUR CLEAR 04/01/2020 1703   LABSPEC 1.009 04/01/2020 1703   PHURINE 5.0 04/01/2020 1703   GLUCOSEU NEGATIVE 04/01/2020 1703   HGBUR NEGATIVE 04/01/2020 1703   BILIRUBINUR NEGATIVE 04/01/2020 1703   KETONESUR 5 (A) 04/01/2020 1703   PROTEINUR NEGATIVE 04/01/2020 1703   NITRITE NEGATIVE 04/01/2020 1703   LEUKOCYTESUR NEGATIVE 04/01/2020 1703    Radiological Exams on Admission: CT ABDOMEN PELVIS W CONTRAST  Result Date: 08/07/2021 CLINICAL DATA:  Left lower quadrant abdominal pain, nausea, vomiting EXAM: CT ABDOMEN AND PELVIS WITH CONTRAST TECHNIQUE: Multidetector CT imaging of the abdomen and pelvis was performed using the standard protocol following bolus administration of intravenous contrast. CONTRAST:  62mL OMNIPAQUE IOHEXOL 350 MG/ML SOLN COMPARISON:  None. FINDINGS: Lower chest: The visualized lung bases are clear. Moderate coronary artery calcification. There is distention of the distal esophagus with fluid which likely relates to gastroesophageal reflux given the patient's history of vomiting. Hepatobiliary: Mural calcification of the gallbladder is noted. No associated mass. The gallbladder is not distended. The liver is unremarkable. No intra or extrahepatic biliary ductal dilation. Pancreas: Unremarkable  Spleen: Unremarkable Adrenals/Urinary Tract: The adrenal glands are unremarkable. The kidneys are normal in size and position. Simple cortical cysts are seen bilaterally. The kidneys are otherwise unremarkable. The bladder is unremarkable. Stomach/Bowel: There is long segment marked circumferential thickening and pericolonic inflammatory of the sigmoid colon. Within the mid sigmoid colon, the inflammatory changes abruptly terminate adjacent to several mural calcifications and infiltrative soft tissue within the adjacent retroperitoneum. This appears progressive since prior examination. The sigmoid colon demonstrates focal perforation in this location with a small focus of extraluminal gas. There is a second site of perforation, slightly distally which appears to track to a contained gas and fluid collection within the colonic mesentery measuring 7.1 x 4.1 cm on coronal image # 43/7. The 2 perforations are best appreciated on coronal image # 52/7. There is mild free intraperitoneal gas noted within the abdomen. The terminal ileum is seen immediately adjacent to the sigmoid colonic mesenteric collection and demonstrates marked wall thickening and hyperemia, likely related to the adjacent inflammatory process. There is a resultant distal small bowel obstruction with multiple dilated fluid-filled loops of small bowel proximally. Small free fluid within the pelvis. Infiltration  within the mesentery and omentum is in keeping with changes of peritonitis. Vascular/Lymphatic: There is a a pathologically enlarged lymph node within the aortocaval lymph node group measuring 14.5 cm in diameter mildly enlarged since prior examination. No additional pathologic adenopathy within the abdomen and pelvis. Mild aortoiliac atherosclerotic calcification. The abdominal vasculature is otherwise unremarkable. Reproductive: Mild prostatic enlargement. Other: Small broad-based fat containing umbilical hernia. Musculoskeletal: No acute bone  abnormality. IMPRESSION: Sigmoid colonic perforation with small free intraperitoneal gas and infiltration of the mesenteric and omental fat in keeping with changes of peritonitis. Long segment inflammatory stranding of the sigmoid colon in keeping with a severe infectious or inflammatory colitis. This terminates an area of irregular mural thickening, infiltrative soft tissue within the colonic mesentery, and focal dystrophic calcification. This may represent a chronic inflammatory process, however, a perforated malignancy could appear similarly. There are 2 separate points of perforation which again raise the question of an underlying malignancy. 7.1 cm gas and fluid containing pericolonic abscess within the sigmoid mesentery. Marked inflammatory change of the terminal ileum adjacent to the pericolonic abscess with resultant small bowel obstruction. Fluid within the distal esophagus likely relates to gastroesophageal reflux the setting of vomiting. Aortic Atherosclerosis (ICD10-I70.0). Electronically Signed   By: Fidela Salisbury M.D.   On: 08/07/2021 00:15   DG Abd Portable 1V  Result Date: 08/07/2021 CLINICAL DATA:  Nasogastric tube placement EXAM: PORTABLE ABDOMEN - 1 VIEW COMPARISON:  None. FINDINGS: Nasogastric tube tip overlies the proximal body of the stomach. Visualized abdominal gas pattern is nonobstructive. No gross free intraperitoneal gas. IMPRESSION: Nasogastric tube tip within the proximal body of the stomach. Electronically Signed   By: Fidela Salisbury M.D.   On: 08/07/2021 02:22    EKG: Independently reviewed.  Assessment/Plan Principal Problem:   Perforated sigmoid colon (HCC) Active Problems:   Abscess of sigmoid colon   Schizophrenia ([place])   SBO (small bowel obstruction) (HCC)    Perforated sigmoid colon - Abscess, SBO Pt with 2 perforations of sigmoid colon on CT scan Also has 7.1cm abscess as well as some free air Abscess causing inflammation of terminal ileum which in turn is  causing SBO. Unclear if diverticulitis vs neoplasm causing the initial perforation. Intra-abdominal process also causing sepsis. General surg has seen patient, and has recommended non-operative management initially See their consult note Though I have some concerns that non-op management may not work given extent of findings as described on CT scan report. Sepsis pathway Putting patient on zosyn NGT to LIS NPO IVF: 2.5L bolus then LR at 125 Mild AKI - Likely secondary to #1 above Repeat BMP in AM Schizophrenia - Takes once monthly Invega And daily cogentin (will cont this)  DVT prophylaxis: SCDs for now, holding off on chemo ppx initially due to high concern that this will require surgery Code Status: Full Family Communication: No family in room at this time (apparently Mother was in room when Dr. Dema Severin saw pt earlier). Disposition Plan: Home after admit Consults called: General surgery, Dr. Dema Severin Admission status: Admit to inpatient  Severity of Illness: The appropriate patient status for this patient is INPATIENT. Inpatient status is judged to be reasonable and necessary in order to provide the required intensity of service to ensure the patient's safety. The patient's presenting symptoms, physical exam findings, and initial radiographic and laboratory data in the context of their chronic comorbidities is felt to place them at high risk for further clinical deterioration. Furthermore, it is not anticipated that the patient will be  medically stable for discharge from the hospital within 2 midnights of admission.   * I certify that at the point of admission it is my clinical judgment that the patient will require inpatient hospital care spanning beyond 2 midnights from the point of admission due to high intensity of service, high risk for further deterioration and high frequency of surveillance required.*   Alesandra Smart M. DO [facility]  How to contact the [facility] Attending or  Consulting provider [place] or covering provider during after hours [place], for this patient?  Check the care team in [facility] and look for a) attending/consulting [facility] provider listed and b) the [facility] team listed Log into [email or web address]  Amion Physician Scheduling and messaging for groups and whole hospitals  On call and physician scheduling software for group practices, residents, hospitalists and other medical providers for call, clinic, rotation and shift schedules. OnCall [employer] is a hospital-wide system for scheduling doctors and paging doctors on call. EasyPlot is for scientific plotting and data analysis.  [email or web address]  and use [place]'s universal password to access. If you do not have the password, please contact the hospital operator.  Locate the [facility] provider you are looking for under [facility] and page to a number that you can be directly reached. If you still have difficulty reaching the provider, please page the [facility] (Director on Call) for the Hospitalists listed on amion for assistance.  08/07/2021, 4:11 AM

## 2021-08-07 NOTE — Progress Notes (Signed)
PROGRESS NOTE Patient is a 56yo male with a history of schizophrenia on monthly invega and cogentin chronically who presented with abdominal pain any weight loss found to have perforated sigmoid diverticulitis (malignancy not excluded) with 7.1cm mesenteric abscess, small free air, and stricturing with evidence of proximal SBO. IV fluids and zosyn were given with clearing of lactic acid and he is medically stabilized. IR has confirmed this location is not amenable to percutaneous drainage, so surgery has recommended laparotomy, colectomy, end colostomy.   Subjective: Pt has no nausea since NGT placed with dark bilious output. No abdominal pain though it does hurt during exam.   Objective: BP (!) 104/91 (BP Location: Right Arm)    Pulse (!) 107    Temp 98.5 F (36.9 C) (Oral)    Resp (!) 24    SpO2 99%   Gen: Nontoxic male with flat affect Pulm: Clear and nonlabored on room air  CV: RRR, no murmur, no JVD, no edema GI: +tenderness without rebound or guarding or rigidity. +BS.  Neuro: Alert and oriented. No focal deficits. Skin: No rashes, lesions or ulcers Psych: Alert, interactive but with restricted affect. No AVH or evidence of delusions, not reacting to internal stimuli.   Assessment & Plan: Sepsis: Lactic acid clearance is reassuring. Leukocytosis is expected, would continue trending postoperatively and continue IV antibiotics, tailor to any culture data if available.   Perforated sigmoid colon with obstruction: Planning ex lap with colectomy, colostomy. Discussed with Dr. Kae Heller who has agreed for surgery to assume care for the patient while hospitalized.   Schizophrenia: No current evidence of psychosis.  - Recommend to continue maintenance monthly invega and, if able to administer enterally, continue cogentin, otherwise restart when able.   Patrecia Pour, MD Pager on amion 08/07/2021, 1:56 PM

## 2021-08-07 NOTE — ED Notes (Signed)
Turned and repositioned for comfort. Denies pain. NGT patent to LWS. Side rails up. Call bell in reach. No vomiting noted.

## 2021-08-08 ENCOUNTER — Inpatient Hospital Stay (HOSPITAL_COMMUNITY): Payer: Medicaid Other | Admitting: Certified Registered Nurse Anesthetist

## 2021-08-08 ENCOUNTER — Encounter (HOSPITAL_COMMUNITY): Admission: EM | Disposition: A | Discharge status: Home Health | Payer: Self-pay | Source: Home / Self Care

## 2021-08-08 HISTORY — PX: LAPAROTOMY: SHX154

## 2021-08-08 HISTORY — PX: PARTIAL COLECTOMY: SHX5273

## 2021-08-08 LAB — BLOOD CULTURE ID PANEL (REFLEXED) - BCID2

## 2021-08-08 LAB — URINE CULTURE: Culture: <10000 — AB

## 2021-08-08 LAB — SURGICAL PCR SCREEN
MRSA, PCR: NEGATIVE
Staphylococcus aureus: POSITIVE — AB

## 2021-08-08 SURGERY — LAPAROTOMY, EXPLORATORY
Anesthesia: General

## 2021-08-08 MED ORDER — DEXAMETHASONE SODIUM PHOSPHATE 10 MG/ML IJ SOLN
INTRAMUSCULAR | Status: DC | PRN
  Administered 2021-08-08: 10 mg via INTRAVENOUS

## 2021-08-08 MED ORDER — PHENYLEPHRINE HCL (PRESSORS) 10 MG/ML IV SOLN
INTRAVENOUS | Status: DC | PRN
  Administered 2021-08-08 (×2): 80 ug via INTRAVENOUS

## 2021-08-08 MED ORDER — LACTATED RINGERS IV SOLN: INTRAVENOUS | Status: DC | PRN

## 2021-08-08 MED ORDER — FENTANYL CITRATE (PF) 250 MCG/5ML IJ SOLN
INTRAMUSCULAR | Status: AC
  Filled 2021-08-08: qty 5

## 2021-08-08 MED ORDER — PROPOFOL 10 MG/ML IV BOLUS
INTRAVENOUS | Status: AC
  Filled 2021-08-08: qty 20

## 2021-08-08 MED ORDER — PROPOFOL 10 MG/ML IV BOLUS
INTRAVENOUS | Status: DC | PRN
  Administered 2021-08-08: 160 mg via INTRAVENOUS

## 2021-08-08 MED ORDER — ROCURONIUM BROMIDE 10 MG/ML (PF) SYRINGE
PREFILLED_SYRINGE | INTRAVENOUS | Status: DC | PRN
Start: 2021-08-08 — End: 2021-08-08
  Administered 2021-08-08: 20 mg via INTRAVENOUS
  Administered 2021-08-08: 50 mg via INTRAVENOUS

## 2021-08-08 MED ORDER — MIDAZOLAM HCL 2 MG/2ML IJ SOLN
INTRAMUSCULAR | Status: AC
  Filled 2021-08-08: qty 2

## 2021-08-08 MED ORDER — SUCCINYLCHOLINE CHLORIDE 200 MG/10ML IV SOSY
PREFILLED_SYRINGE | INTRAVENOUS | Status: DC | PRN
  Administered 2021-08-08: 140 mg via INTRAVENOUS

## 2021-08-08 MED ORDER — ONDANSETRON HCL 4 MG/2ML IJ SOLN
INTRAMUSCULAR | Status: DC | PRN
Start: 2021-08-08 — End: 2021-08-08
  Administered 2021-08-08: 4 mg via INTRAVENOUS

## 2021-08-08 MED ORDER — VASOPRESSIN 20 UNIT/ML IV SOLN
INTRAVENOUS | Status: AC
  Filled 2021-08-08: qty 1

## 2021-08-08 MED ORDER — CHLORHEXIDINE GLUCONATE CLOTH 2 % EX PADS
6.0000 | MEDICATED_PAD | Freq: Every day | CUTANEOUS | Status: DC
  Administered 2021-08-08 – 2021-08-22 (×15): 6 via TOPICAL

## 2021-08-08 MED ORDER — SUGAMMADEX SODIUM 200 MG/2ML IV SOLN
INTRAVENOUS | Status: DC | PRN
Start: 2021-08-08 — End: 2021-08-08
  Administered 2021-08-08: 200 mg via INTRAVENOUS

## 2021-08-08 MED ORDER — LIDOCAINE 2% (20 MG/ML) 5 ML SYRINGE
INTRAMUSCULAR | Status: DC | PRN
Start: 2021-08-08 — End: 2021-08-08
  Administered 2021-08-08: 100 mg via INTRAVENOUS

## 2021-08-08 MED ORDER — METHOCARBAMOL 1000 MG/10ML IJ SOLN
500.0000 mg | Freq: Four times a day (QID) | INTRAVENOUS | Status: DC | PRN
  Administered 2021-08-12 (×2): 500 mg via INTRAVENOUS
  Filled 2021-08-08: qty 500
  Filled 2021-08-08: qty 5
  Filled 2021-08-08: qty 500

## 2021-08-08 MED ORDER — FENTANYL CITRATE (PF) 250 MCG/5ML IJ SOLN
INTRAMUSCULAR | Status: DC | PRN
  Administered 2021-08-08 (×4): 50 ug via INTRAVENOUS

## 2021-08-08 MED ORDER — MUPIROCIN 2 % EX OINT
1.0000 "application " | TOPICAL_OINTMENT | Freq: Two times a day (BID) | CUTANEOUS | Status: AC
  Administered 2021-08-08 – 2021-08-12 (×9): 1 via NASAL
  Filled 2021-08-08: qty 22

## 2021-08-08 MED ORDER — HYDROMORPHONE HCL 1 MG/ML IJ SOLN
0.5000 mg | INTRAMUSCULAR | Status: DC | PRN
  Administered 2021-08-09 – 2021-08-10 (×3): 1 mg via INTRAVENOUS
  Filled 2021-08-08 (×3): qty 1

## 2021-08-08 MED ORDER — HYDROMORPHONE HCL 1 MG/ML IJ SOLN
0.2500 mg | INTRAMUSCULAR | Status: DC | PRN
  Administered 2021-08-08 (×2): 0.5 mg via INTRAVENOUS

## 2021-08-08 MED ORDER — HYDROMORPHONE HCL 1 MG/ML IJ SOLN
INTRAMUSCULAR | Status: AC
  Filled 2021-08-08: qty 1

## 2021-08-08 MED ORDER — 0.9 % SODIUM CHLORIDE (POUR BTL) OPTIME
TOPICAL | Status: DC | PRN
  Administered 2021-08-08 (×4): 1000 mL

## 2021-08-08 SURGICAL SUPPLY — 51 items
BAG COUNTER SPONGE SURGICOUNT (BAG) ×2 IMPLANT
BIOPATCH RED 1 DISK 7.0 (GAUZE/BANDAGES/DRESSINGS) ×1 IMPLANT
BLADE CLIPPER SURG (BLADE) ×1 IMPLANT
BNDG GAUZE ELAST 4 BULKY (GAUZE/BANDAGES/DRESSINGS) ×1 IMPLANT
CANISTER SUCT 3000ML PPV (MISCELLANEOUS) ×1 IMPLANT
CHLORAPREP W/TINT 26 (MISCELLANEOUS) ×2 IMPLANT
COVER SURGICAL LIGHT HANDLE (MISCELLANEOUS) ×2 IMPLANT
DRAIN CHANNEL 19F RND (DRAIN) ×1 IMPLANT
DRAPE LAPAROSCOPIC ABDOMINAL (DRAPES) ×2 IMPLANT
DRAPE WARM FLUID 44X44 (DRAPES) ×2 IMPLANT
DRSG OPSITE POSTOP 4X10 (GAUZE/BANDAGES/DRESSINGS) IMPLANT
DRSG OPSITE POSTOP 4X8 (GAUZE/BANDAGES/DRESSINGS) IMPLANT
DRSG PAD ABDOMINAL 8X10 ST (GAUZE/BANDAGES/DRESSINGS) ×2 IMPLANT
DRSG TEGADERM 4X4.75 (GAUZE/BANDAGES/DRESSINGS) ×1 IMPLANT
ELECT BLADE 6.5 EXT (BLADE) ×1 IMPLANT
ELECT CAUTERY BLADE 6.4 (BLADE) ×2 IMPLANT
ELECT REM PT RETURN 9FT ADLT (ELECTROSURGICAL) ×2
ELECTRODE REM PT RTRN 9FT ADLT (ELECTROSURGICAL) ×1 IMPLANT
EVACUATOR SILICONE 100CC (DRAIN) ×1 IMPLANT
GAUZE SPONGE 4X4 12PLY STRL (GAUZE/BANDAGES/DRESSINGS) ×1 IMPLANT
GLOVE SURG ENC MOIS LTX SZ6 (GLOVE) ×2 IMPLANT
GLOVE SURG UNDER LTX SZ6.5 (GLOVE) ×2 IMPLANT
GOWN STRL REUS W/ TWL LRG LVL3 (GOWN DISPOSABLE) ×2 IMPLANT
GOWN STRL REUS W/TWL LRG LVL3 (GOWN DISPOSABLE) ×2
HANDLE SUCTION POOLE (INSTRUMENTS) ×1 IMPLANT
KIT BASIN OR (CUSTOM PROCEDURE TRAY) ×2 IMPLANT
KIT OSTOMY DRAINABLE 2.75 STR (WOUND CARE) ×1 IMPLANT
KIT TURNOVER KIT B (KITS) ×2 IMPLANT
LIGASURE IMPACT 36 18CM CVD LR (INSTRUMENTS) IMPLANT
NS IRRIG 1000ML POUR BTL (IV SOLUTION) ×4 IMPLANT
PACK GENERAL/GYN (CUSTOM PROCEDURE TRAY) ×2 IMPLANT
PAD ARMBOARD 7.5X6 YLW CONV (MISCELLANEOUS) ×2 IMPLANT
PENCIL SMOKE EVACUATOR (MISCELLANEOUS) ×1 IMPLANT
RELOAD PROXIMATE 75MM BLUE (ENDOMECHANICALS) ×2 IMPLANT
RELOAD STAPLE 75 3.8 BLU REG (ENDOMECHANICALS) IMPLANT
SPECIMEN JAR LARGE (MISCELLANEOUS) IMPLANT
SPONGE T-LAP 18X18 ~~LOC~~+RFID (SPONGE) IMPLANT
STAPLER PROXIMATE 75MM BLUE (STAPLE) ×1 IMPLANT
STAPLER VISISTAT 35W (STAPLE) ×1 IMPLANT
SUCTION POOLE HANDLE (INSTRUMENTS) ×2
SUT ETHILON 2 0 FS 18 (SUTURE) ×1 IMPLANT
SUT PDS AB 1 TP1 96 (SUTURE) ×4 IMPLANT
SUT PROLENE 2 0 CT2 30 (SUTURE) ×1 IMPLANT
SUT SILK 2 0 SH CR/8 (SUTURE) ×2 IMPLANT
SUT SILK 2 0 TIES 10X30 (SUTURE) ×2 IMPLANT
SUT SILK 3 0 SH CR/8 (SUTURE) ×2 IMPLANT
SUT SILK 3 0 TIES 10X30 (SUTURE) ×2 IMPLANT
SUT VIC AB 3-0 SH 18 (SUTURE) IMPLANT
SUT VIC AB 3-0 SH 8-18 (SUTURE) ×1 IMPLANT
TOWEL GREEN STERILE (TOWEL DISPOSABLE) ×2 IMPLANT
TRAY FOLEY MTR SLVR 16FR STAT (SET/KITS/TRAYS/PACK) IMPLANT

## 2021-08-08 NOTE — Anesthesia Preprocedure Evaluation (Signed)
Anesthesia Evaluation  Patient identified by MRN, date of birth, ID band Patient awake    Reviewed: Allergy & Precautions, NPO status , Patient's Chart, lab work & pertinent test results  Airway Mallampati: II  TM Distance: >3 FB     Dental   Pulmonary neg pulmonary ROS,    breath sounds clear to auscultation       Cardiovascular negative cardio ROS   Rhythm:Regular Rate:Normal     Neuro/Psych    GI/Hepatic Neg liver ROS, History noted Dr. Nyoka Cowden   Endo/Other  negative endocrine ROS  Renal/GU negative Renal ROS     Musculoskeletal   Abdominal   Peds  Hematology   Anesthesia Other Findings   Reproductive/Obstetrics                             Anesthesia Physical Anesthesia Plan  ASA: 3  Anesthesia Plan: General   Post-op Pain Management:    Induction: Intravenous  PONV Risk Score and Plan: 2 and Dexamethasone, Ondansetron and Midazolam  Airway Management Planned:   Additional Equipment:   Intra-op Plan:   Post-operative Plan:   Informed Consent: I have reviewed the patients History and Physical, chart, labs and discussed the procedure including the risks, benefits and alternatives for the proposed anesthesia with the patient or authorized representative who has indicated his/her understanding and acceptance.     Dental advisory given  Plan Discussed with: CRNA and Anesthesiologist  Anesthesia Plan Comments:         Anesthesia Quick Evaluation

## 2021-08-08 NOTE — Anesthesia Procedure Notes (Signed)
Procedure Name: Intubation Date/Time: 08/08/2021 7:37 AM Performed by: Clearnce Sorrel, CRNA Pre-anesthesia Checklist: Patient identified, Emergency Drugs available, Suction available and Patient being monitored Patient Re-evaluated:Patient Re-evaluated prior to induction Oxygen Delivery Method: Circle System Utilized Preoxygenation: Pre-oxygenation with 100% oxygen Induction Type: IV induction, Rapid sequence and Cricoid Pressure applied Laryngoscope Size: Glidescope and 4 Grade View: Grade I Tube type: Oral Tube size: 7.5 mm Number of attempts: 1 Airway Equipment and Method: Stylet and Oral airway Placement Confirmation: ETT inserted through vocal cords under direct vision, positive ETCO2 and breath sounds checked- equal and bilateral Secured at: 23 cm Tube secured with: Tape Dental Injury: Teeth and Oropharynx as per pre-operative assessment

## 2021-08-08 NOTE — Progress Notes (Signed)
PHARMACY - PHYSICIAN COMMUNICATION CRITICAL VALUE ALERT - BLOOD CULTURE IDENTIFICATION (BCID)  Matthew [name] is an 56 y.o. male who presented to [facility] on 08/06/2021 with a chief complaint of abdominal pain  Assessment:  1/7 bottles growing GNRs. BCID identified E. Coli without resistance.   Name of physician (or Provider) Contacted: Dr. Kae Heller   Current antibiotics: Zosyn  Changes to prescribed antibiotics recommended: Continue Zosyn and consider de-escalation once patient has stabilized after surgery Patient is on recommended antibiotics - No changes needed  Results for orders placed or performed during the hospital encounter of 08/06/21  Blood Culture ID Panel (Reflexed) (Collected: 08/07/2021  6:04 AM)  Result Value Ref Range   Enterococcus faecalis NOT DETECTED NOT DETECTED   Enterococcus Faecium NOT DETECTED NOT DETECTED   Listeria monocytogenes NOT DETECTED NOT DETECTED   Staphylococcus species NOT DETECTED NOT DETECTED   Staphylococcus aureus (BCID) NOT DETECTED NOT DETECTED   Staphylococcus epidermidis NOT DETECTED NOT DETECTED   Staphylococcus lugdunensis NOT DETECTED NOT DETECTED   Streptococcus species NOT DETECTED NOT DETECTED   Streptococcus agalactiae NOT DETECTED NOT DETECTED   Streptococcus pneumoniae NOT DETECTED NOT DETECTED   Streptococcus pyogenes NOT DETECTED NOT DETECTED   A.calcoaceticus-baumannii NOT DETECTED NOT DETECTED   Bacteroides fragilis NOT DETECTED NOT DETECTED   Enterobacterales DETECTED (A) NOT DETECTED   Enterobacter cloacae complex NOT DETECTED NOT DETECTED   Escherichia coli DETECTED (A) NOT DETECTED   Klebsiella aerogenes NOT DETECTED NOT DETECTED   Klebsiella oxytoca NOT DETECTED NOT DETECTED   Klebsiella pneumoniae NOT DETECTED NOT DETECTED   Proteus species NOT DETECTED NOT DETECTED   Salmonella species NOT DETECTED NOT DETECTED   Serratia marcescens NOT DETECTED NOT DETECTED   Haemophilus influenzae NOT DETECTED NOT DETECTED    Neisseria meningitidis NOT DETECTED NOT DETECTED   Pseudomonas aeruginosa NOT DETECTED NOT DETECTED   Stenotrophomonas maltophilia NOT DETECTED NOT DETECTED   Candida albicans NOT DETECTED NOT DETECTED   Candida auris NOT DETECTED NOT DETECTED   Candida glabrata NOT DETECTED NOT DETECTED   Candida krusei NOT DETECTED NOT DETECTED   Candida parapsilosis NOT DETECTED NOT DETECTED   Candida tropicalis NOT DETECTED NOT DETECTED   Cryptococcus neoformans/gattii NOT DETECTED NOT DETECTED   CTX-M ESBL NOT DETECTED NOT DETECTED   Carbapenem resistance IMP NOT DETECTED NOT DETECTED   Carbapenem resistance KPC NOT DETECTED NOT DETECTED   Carbapenem resistance NDM NOT DETECTED NOT DETECTED   Carbapenem resist OXA 48 LIKE NOT DETECTED NOT DETECTED   Carbapenem resistance VIM NOT DETECTED NOT DETECTED    Albertina Parr, PharmD., BCPS, BCCCP Clinical Pharmacist Please refer to [facility] for unit-specific pharmacist

## 2021-08-08 NOTE — Anesthesia Postprocedure Evaluation (Signed)
Anesthesia Post Note  Patient: Matthew [name]  Procedure(s) Performed: EXPLORATORY LAPAROTOMY PARTIAL COLECTOMY WITH COLOSTOMY     Patient location during evaluation: ICU Anesthesia Type: General Level of consciousness: patient remains intubated per anesthesia plan Pain management: pain level controlled Vital Signs Assessment: post-procedure vital signs reviewed and stable Respiratory status: patient remains intubated per anesthesia plan Cardiovascular status: stable Postop Assessment: no apparent nausea or vomiting Anesthetic complications: no   No notable events documented.  Last Vitals:  Vitals:   08/08/21 1009 08/08/21 1015  BP: 107/71   Pulse:  (!) 105  Resp:  14  Temp: 36.6 C   SpO2:  97%    Last Pain:  Vitals:   08/08/21 1009  TempSrc: Oral  PainSc:                  Doye Montilla

## 2021-08-08 NOTE — Progress Notes (Signed)
Pt had a fever of 101.2 F orally.  The on call physician was informed and asked to give the patient an IV medication to help since he is NPO.  The doctor ordered IV OFIRMEV 1000 mg every 6 hours as needed for fever and mild pain.  Will continue to monitor.  Lupita Dawn, RN

## 2021-08-08 NOTE — Progress Notes (Addendum)
Day of Surgery   Subjective/Chief Complaint: No significant change. No bowel function. Febrile overnight   Objective: Vital signs in last 24 hours: Temp:  [97.8 F (36.6 C)-101.2 F (38.4 C)] 98.8 F (37.1 C) (01/06 0400) Pulse Rate:  [105-127] 108 (01/06 0400) Resp:  [18-28] 18 (01/06 0400) BP: (91-122)/(60-91) 95/68 (01/06 0400) SpO2:  [95 %-100 %] 96 % (01/06 0400) Weight:  [60.4 kg] 60.4 kg (01/05 1851) Last BM Date:  (PTA)  Intake/Output from previous day: 01/05 0701 - 01/06 0700 In: 2743.6 [I.V.:2503.6; IV Piggyback:240.1] Out: 900 [Urine:250; Emesis/NG output:650] Intake/Output this shift: No intake/output data recorded.  Alert, interactive, but only really answers yes or no questions Unlabored respirations Abdomen is soft, mildly distended, more tender in the lower fields.  NG tube with bilious output No lower extremity edema  Lab Results:  Recent Labs    08/06/21 1922 08/07/21 0537  WBC 11.4* 15.1*  HGB 12.4* 11.9*  HCT 38.8* 35.2*  PLT 353 328    BMET Recent Labs    08/06/21 1922 08/07/21 0537  NA 137 137  K 3.5 3.3*  CL 100 104  CO2 23 24  GLUCOSE 165* 152*  BUN 14 16  CREATININE 1.48* 1.23  CALCIUM 9.4 8.9    PT/INR No results for input(s): LABPROT, INR in the last 72 hours. ABG No results for input(s): PHART, HCO3 in the last 72 hours.  Invalid input(s): PCO2, PO2  Studies/Results: CT ABDOMEN PELVIS W CONTRAST  Result Date: 08/07/2021 CLINICAL DATA:  Left lower quadrant abdominal pain, nausea, vomiting EXAM: CT ABDOMEN AND PELVIS WITH CONTRAST TECHNIQUE: Multidetector CT imaging of the abdomen and pelvis was performed using the standard protocol following bolus administration of intravenous contrast. CONTRAST:  37mL OMNIPAQUE IOHEXOL 350 MG/ML SOLN COMPARISON:  None. FINDINGS: Lower chest: The visualized lung bases are clear. Moderate coronary artery calcification. There is distention of the distal esophagus with fluid which likely  relates to gastroesophageal reflux given the patient's history of vomiting. Hepatobiliary: Mural calcification of the gallbladder is noted. No associated mass. The gallbladder is not distended. The liver is unremarkable. No intra or extrahepatic biliary ductal dilation. Pancreas: Unremarkable Spleen: Unremarkable Adrenals/Urinary Tract: The adrenal glands are unremarkable. The kidneys are normal in size and position. Simple cortical cysts are seen bilaterally. The kidneys are otherwise unremarkable. The bladder is unremarkable. Stomach/Bowel: There is long segment marked circumferential thickening and pericolonic inflammatory of the sigmoid colon. Within the mid sigmoid colon, the inflammatory changes abruptly terminate adjacent to several mural calcifications and infiltrative soft tissue within the adjacent retroperitoneum. This appears progressive since prior examination. The sigmoid colon demonstrates focal perforation in this location with a small focus of extraluminal gas. There is a second site of perforation, slightly distally which appears to track to a contained gas and fluid collection within the colonic mesentery measuring 7.1 x 4.1 cm on coronal image # 43/7. The 2 perforations are best appreciated on coronal image # 52/7. There is mild free intraperitoneal gas noted within the abdomen. The terminal ileum is seen immediately adjacent to the sigmoid colonic mesenteric collection and demonstrates marked wall thickening and hyperemia, likely related to the adjacent inflammatory process. There is a resultant distal small bowel obstruction with multiple dilated fluid-filled loops of small bowel proximally. Small free fluid within the pelvis. Infiltration within the mesentery and omentum is in keeping with changes of peritonitis. Vascular/Lymphatic: There is a a pathologically enlarged lymph node within the aortocaval lymph node group measuring 14.5 cm in diameter  mildly enlarged since prior examination. No  additional pathologic adenopathy within the abdomen and pelvis. Mild aortoiliac atherosclerotic calcification. The abdominal vasculature is otherwise unremarkable. Reproductive: Mild prostatic enlargement. Other: Small broad-based fat containing umbilical hernia. Musculoskeletal: No acute bone abnormality. IMPRESSION: Sigmoid colonic perforation with small free intraperitoneal gas and infiltration of the mesenteric and omental fat in keeping with changes of peritonitis. Long segment inflammatory stranding of the sigmoid colon in keeping with a severe infectious or inflammatory colitis. This terminates an area of irregular mural thickening, infiltrative soft tissue within the colonic mesentery, and focal dystrophic calcification. This may represent a chronic inflammatory process, however, a perforated malignancy could appear similarly. There are 2 separate points of perforation which again raise the question of an underlying malignancy. 7.1 cm gas and fluid containing pericolonic abscess within the sigmoid mesentery. Marked inflammatory change of the terminal ileum adjacent to the pericolonic abscess with resultant small bowel obstruction. Fluid within the distal esophagus likely relates to gastroesophageal reflux the setting of vomiting. Aortic Atherosclerosis (ICD10-I70.0). Electronically Signed   By: Fidela Salisbury M.D.   On: 08/07/2021 00:15   DG CHEST PORT 1 VIEW  Result Date: 08/07/2021 CLINICAL DATA:  Preop exam. Schizophrenia. Nasogastric tube placement. EXAM: PORTABLE CHEST 1 VIEW COMPARISON:  Earlier today FINDINGS: The nasogastric tube has been retracted. The tip of the tube is 1 cm below the carina. Stable cardiomediastinal contours. No pleural effusion or edema. No airspace opacities identified. IMPRESSION: 1. No acute cardiopulmonary abnormalities. 2. The NG tube has been retracted and the tip is in the mid esophagus approximately 1 cm below the level of the carina. Electronically Signed   By: Kerby Moors M.D.   On: 08/07/2021 14:27   DG Abd Portable 1V  Result Date: 08/07/2021 CLINICAL DATA:  Nasogastric tube placement EXAM: PORTABLE ABDOMEN - 1 VIEW COMPARISON:  None. FINDINGS: Nasogastric tube tip overlies the proximal body of the stomach. Visualized abdominal gas pattern is nonobstructive. No gross free intraperitoneal gas. IMPRESSION: Nasogastric tube tip within the proximal body of the stomach. Electronically Signed   By: Fidela Salisbury M.D.   On: 08/07/2021 02:22    Anti-infectives: Anti-infectives (From admission, onward)    Start     Dose/Rate Route Frequency Ordered Stop   08/07/21 0600  [MAR Hold]  piperacillin-tazobactam (ZOSYN) IVPB 3.375 g        (MAR Hold since Fri 08/08/2021 at 0656.Hold Reason: Transfer to a Procedural area)   3.375 g 12.5 mL/hr over 240 Minutes Intravenous Every 8 hours 08/07/21 0544     08/06/21 2100  piperacillin-tazobactam (ZOSYN) IVPB 3.375 g        3.375 g 12.5 mL/hr over 240 Minutes Intravenous  Once 08/06/21 2057 08/07/21 0900       Assessment/Plan: Perforated diverticulitis (neoplasm not excluded) with obstruction bacteremia Lactate is normalized but white count is increasing.  Persistent tachycardia Febrile  I reviewed his CT scan personally.  Given his history, appearance on CT scan and obstructive symptoms, I do not think this is going to resolve with nonoperative treatment.  The abscess does not appear drainable based on location, confirmed with IR.  I recommend proceeding with exploratory laparotomy, segmental colectomy and end colostomy, high likelihood of needing a small bowel resection as well.  Given that he has had symptoms for at least a week at this point he is high risk for bowel injury and subsequent leak or fistula as well as ileus, in addition to the fact that he is on  anticholinergics for his psychiatric history.  Extensive discussion with his mother and sister as documented yesterday.  We will proceed to the OR this  morning.   LOS: 1 day    Matthew [name] 08/08/2021

## 2021-08-08 NOTE — Transfer of Care (Signed)
Immediate Anesthesia Transfer of Care Note  Patient: Matthew [name]  Procedure(s) Performed: EXPLORATORY LAPAROTOMY PARTIAL COLECTOMY WITH COLOSTOMY  Patient Location: PACU  Anesthesia Type:General  Level of Consciousness: awake  Airway & Oxygen Therapy: Patient Spontanous Breathing  Post-op Assessment: Report given to RN and Post -op Vital signs reviewed and stable  Post vital signs: Reviewed and stable  Last Vitals:  Vitals Value Taken Time  BP 128/71 08/08/21 0923  Temp    Pulse 119 08/08/21 0924  Resp 26 08/08/21 0924  SpO2 98 % 08/08/21 0924  Vitals shown include unvalidated device data.  Last Pain:  Vitals:   08/08/21 0400  TempSrc: Oral  PainSc:          Complications: No notable events documented.

## 2021-08-08 NOTE — Op Note (Signed)
Operative Note  Kahleb Mcclane  [id number]  [id number]  08/08/2021   Surgeon: Romana Juniper MD   Assistant: Georganna Skeans MD    Procedure performed: Exploratory laparotomy, sigmoid colectomy with end colostomy  Procedure status: Emergent   Preop diagnosis: Perforated viscus with abscess and pneumoperitoneum, small bowel obstruction, history of diverticulitis.  Infection was present at the time of surgery.  Post-op diagnosis/intraop findings: Perforated segment of sigmoid colon with tethering of the mesentery, associated abscess/purulent peritonitis and reactive small bowel obstruction   Specimens: Sigmoid colon, stitch marks proximal staple line Retained items: 36 French round Blake drain in the pelvis EBL: 01SW Complications: none   Description of procedure: After obtaining informed consent the patient was taken to the operating room and placed supine on operating room table where general endotracheal anesthesia was initiated, preoperative antibiotics were administered, SCDs applied, and a formal timeout was performed.  The abdomen was prepped and draped in usual sterile fashion and a midline laparotomy was created.  Immediately upon entry to the peritoneal cavity, purulent peritonitis and pus was encountered.  The small bowel is diffusely dilated.  This was gently eviscerated, breaking up purulent and inflammatory adhesions between loops of small bowel in between the small bowel/small bowel mesentery and an abscess cavity adjacent to the medial aspect of the sigmoid colon and its mesentery.  The abscess was decompressed aspirating purulent fluid.  The bowel was able to be freed completely and there was no apparent fistula or enterotomy.  The diseased segment of sigmoid colon was immediately identified and was noted to be tethered with fibrotic adhesions to the retroperitoneum medially, this was gently broken up with finger fracture until the segment could be lifted.  The lateral attachments  of the colon were taken down with cautery to mobilize the descending and sigmoid colon.  Healthy appearing area on the rectum was selected and transected with a 75 mm blue load Endo GIA stapler.  A 2-0 Prolene suture with long tails was placed at the left corner of the rectal stump staple line for future identification.  A healthy site proximal to the diseased segment of sigmoid colon was selected and this area was divided with the 75 mm blue load Endo GIA stapler.  The intervening mesentery was divided with the LigaSure, staying close to the bowel.  The retroperitoneum was not entered during dissection.  The specimen was marked with a 2-0 silk on the proximal staple line and handed off for pathology.  Hemostasis was ensured within the abdomen, there was one bleeding point on the divided mesentery which was suture-ligated with a 2-0 silk.  Purulent rind was debrided from the bowel wall and rectal stump gently.  The small bowel was inspected from the ileocecal valve to the ligament of Treitz and there was no other point of obstruction or abnormality.  1 expected small serosal tear on the terminal ileum was imbricated with 3 simple interrupted 3-0 silk seromuscular sutures.  The small bowel was returned to the abdominal cavity and a colostomy site was selected in the left upper quadrant.  A core of skin and soft tissue were excised and the anterior fascia divided in a cruciate fashion, splitting the rectus muscle and dilating the site to 2 fingerbreadths.  The transected end of the descending colon was brought out through this ensuring that there was no twist in the mesentery.  This reached easily and sat without tension.  The abdomen was then irrigated with warm sterile saline and the effluent was clear.  A 19 French drain was inserted to the right lower quadrant of the abdomen and placed in the pelvis anterior to the rectum and adjacent to the abscess rind on the small bowel mesentery.  The omentum was brought down  over the small bowel.  The fascia was closed with running looped #1 PDS starting at either end and tying centrally.  The ostomy was matured with interrupted 3-0 Vicryls and was probed digitally and confirmed to be patent through the fascia.  An ostomy appliance was placed.  A damp to dry dressing was applied to the midline wound.  The drain was secured to the skin with a 2-0 nylon and placed to bulb suction.  The patient was then awakened, extubated and taken to PACU in stable condition.    All counts were correct at the completion of the case.

## 2021-08-08 NOTE — Progress Notes (Signed)
Mother came to check on son and how procedure went. Apparently, her phone is not working and would like a call at [phone] for update from doctor.

## 2021-08-09 ENCOUNTER — Encounter (HOSPITAL_COMMUNITY): Payer: Self-pay | Admitting: Surgery

## 2021-08-09 LAB — CULTURE, BLOOD (ROUTINE X 2): Special Requests: ADEQUATE

## 2021-08-09 MED ORDER — BENZTROPINE MESYLATE 1 MG/ML IJ SOLN
1.0000 mg | Freq: Every day | INTRAMUSCULAR | Status: DC
  Administered 2021-08-10 – 2021-08-16 (×8): 1 mg via INTRAMUSCULAR
  Filled 2021-08-09 (×14): qty 1

## 2021-08-09 MED ORDER — BENZTROPINE MESYLATE 1 MG PO TABS
1.0000 mg | ORAL_TABLET | Freq: Every day | ORAL | Status: DC
  Administered 2021-08-17 – 2021-08-21 (×5): 1 mg via ORAL
  Filled 2021-08-09 (×7): qty 1

## 2021-08-09 MED ORDER — CEFAZOLIN SODIUM-DEXTROSE 2-4 GM/100ML-% IV SOLN
2.0000 g | Freq: Three times a day (TID) | INTRAVENOUS | Status: DC
  Administered 2021-08-09 – 2021-08-11 (×6): 2 g via INTRAVENOUS
  Filled 2021-08-09 (×6): qty 100

## 2021-08-09 NOTE — Progress Notes (Signed)
1 Day Post-Op   Chief Complaint/Subjective: Moderate pain, thirsty  Review of Systems See above, otherwise other systems negative   PMH -  has a past medical history of Schizophrenia ([place]). PSH -  has a past surgical history that includes laparotomy (N/A, 08/08/2021) and Partial colectomy (N/A, 08/08/2021).  [facility] - family history is not on file.   Objective: Vital signs in last 24 hours: Temp:  [97.4 F (36.3 C)-98 F (36.7 C)] 97.4 F (36.3 C) (01/07 0940) Pulse Rate:  [71-109] 71 (01/07 0940) Resp:  [14-25] 21 (01/07 0940) BP: (107-121)/(64-86) 121/86 (01/07 0940) SpO2:  [98 %-100 %] 99 % (01/07 0940) Last BM Date:  (PTA) Intake/Output from previous day: 01/06 0701 - 01/07 0700 In: 3496.5 [P.O.:200; I.V.:3178.4; IV Piggyback:118.1] Out: 2165 [Urine:1325; Emesis/NG output:300; Drains:290; Blood:50] Intake/Output this shift: Total I/O In: -  Out: 635 [Urine:575; Drains:60]  PE: Gen: NAD Resp: nonlabored Card: RRR Abd: soft, open wound with clean base, ostomy in place with some thin fluid in bag, drain with serous output  Lab Results:  Recent Labs    08/06/21 1922 08/07/21 0537  WBC 11.4* 15.1*  HGB 12.4* 11.9*  HCT 38.8* 35.2*  PLT 353 328   BMET Recent Labs    08/06/21 1922 08/07/21 0537  NA 137 137  K 3.5 3.3*  CL 100 104  CO2 23 24  GLUCOSE 165* 152*  BUN 14 16  CREATININE 1.48* 1.23  CALCIUM 9.4 8.9   PT/INR No results for input(s): LABPROT, INR in the last 72 hours. CMP     Component Value Date/Time   NA 137 08/07/2021 0537   K 3.3 (L) 08/07/2021 0537   CL 104 08/07/2021 0537   CO2 24 08/07/2021 0537   GLUCOSE 152 (H) 08/07/2021 0537   BUN 16 08/07/2021 0537   CREATININE 1.23 08/07/2021 0537   CALCIUM 8.9 08/07/2021 0537   PROT 8.0 08/06/2021 1922   ALBUMIN 3.7 08/06/2021 1922   AST 38 08/06/2021 1922   ALT 39 08/06/2021 1922   ALKPHOS 108 08/06/2021 1922   BILITOT 3.8 (H) 08/06/2021 1922   GFRNONAA >60 08/07/2021 0537   GFRAA >60  04/01/2020 1237   Lipase     Component Value Date/Time   LIPASE 22 08/06/2021 1922    Studies/Results: DG CHEST PORT 1 VIEW  Result Date: 08/07/2021 CLINICAL DATA:  Preop exam. Schizophrenia. Nasogastric tube placement. EXAM: PORTABLE CHEST 1 VIEW COMPARISON:  Earlier today FINDINGS: The nasogastric tube has been retracted. The tip of the tube is 1 cm below the carina. Stable cardiomediastinal contours. No pleural effusion or edema. No airspace opacities identified. IMPRESSION: 1. No acute cardiopulmonary abnormalities. 2. The NG tube has been retracted and the tip is in the mid esophagus approximately 1 cm below the level of the carina. Electronically Signed   By: Kerby Moors M.D.   On: 08/07/2021 14:27    Anti-infectives: Anti-infectives (From admission, onward)    Start     Dose/Rate Route Frequency Ordered Stop   08/07/21 0600  piperacillin-tazobactam (ZOSYN) IVPB 3.375 g        3.375 g 12.5 mL/hr over 240 Minutes Intravenous Every 8 hours 08/07/21 0544     08/06/21 2100  piperacillin-tazobactam (ZOSYN) IVPB 3.375 g        3.375 g 12.5 mL/hr over 240 Minutes Intravenous  Once 08/06/21 2057 08/07/21 0900       Assessment/Plan  s/p Procedure(s): EXPLORATORY LAPAROTOMY PARTIAL COLECTOMY WITH COLOSTOMY 08/08/2021  -continue NG tube -up  to chair  FEN - NG tube, NPO VTE - lovenox ID - zosyn 1/5 >> Disposition - inpatient, await return of bowel function   LOS: 2 days   I reviewed last 24 h vitals and pain scores, last 48 h intake and output, last 24 h labs and trends, and last 24 h imaging results.  This care required moderate level of medical decision making.   [facility] 08/09/2021, 10:18 AM Please see Amion for pager number during day hours 7:00am-4:30pm or 7:00am -11:30am on weekends

## 2021-08-09 NOTE — Progress Notes (Signed)
Pt accidentally dislodge NG tube, vomited approx 100 mLs of bile. NG tube was replaced via external marking. Immediately about 150 mLs bilious drainage emptied in canister. Replaced tape on nose. Pt reports relief of nausea. Mouth care done. VSS.

## 2021-08-10 LAB — BASIC METABOLIC PANEL
Anion gap: 8 (ref 5–15)
BUN: 16 mg/dL (ref 6–20)
CO2: 26 mmol/L (ref 22–32)
Calcium: 8.2 mg/dL — ABNORMAL LOW (ref 8.9–10.3)
Chloride: 110 mmol/L (ref 98–111)
Creatinine, Ser: 1.04 mg/dL (ref 0.61–1.24)
GFR, Estimated: >60 mL/min (ref >60)
Glucose, Bld: 123 mg/dL — ABNORMAL HIGH (ref 70–99)
Potassium: 3.9 mmol/L (ref 3.5–5.1)
Sodium: 144 mmol/L (ref 135–145)

## 2021-08-10 LAB — CBC
HCT: 28.9 % — ABNORMAL LOW (ref 39.0–52.0)
Hemoglobin: 9.3 g/dL — ABNORMAL LOW (ref 13.0–17.0)
MCH: 27.4 pg (ref 26.0–34.0)
MCHC: 32.2 g/dL (ref 30.0–36.0)
MCV: 85.3 fL (ref 80.0–100.0)
Platelets: 356 10*3/uL (ref 150–400)
RBC: 3.39 MIL/uL — ABNORMAL LOW (ref 4.22–5.81)
RDW: 13.4 % (ref 11.5–15.5)
WBC: 12.1 10*3/uL — ABNORMAL HIGH (ref 4.0–10.5)
nRBC: 0 % (ref 0.0–0.2)

## 2021-08-10 LAB — AEROBIC/ANAEROBIC CULTURE W GRAM STAIN (SURGICAL/DEEP WOUND)

## 2021-08-10 NOTE — Progress Notes (Signed)
2 Days Post-Op   Chief Complaint/Subjective: Continues to ask for water, moderate pain and nausea  Review of Systems See above, otherwise other systems negative  PMH -  has a past medical history of Schizophrenia ([place]). PSH -  has a past surgical history that includes laparotomy (N/A, 08/08/2021) and Partial colectomy (N/A, 08/08/2021).  [facility] - family history is not on file.   Objective: Vital signs in last 24 hours: Temp:  [97.4 F (36.3 C)-98.8 F (37.1 C)] 98.8 F (37.1 C) (01/08 0804) Pulse Rate:  [64-76] 64 (01/08 0804) Resp:  [17-21] 17 (01/08 0804) BP: (114-124)/(63-86) 117/70 (01/08 0804) SpO2:  [96 %-100 %] 99 % (01/08 0804) Last BM Date:  (PTA) Intake/Output from previous day: 01/07 0701 - 01/08 0700 In: 3335.5 [P.O.:240; I.V.:2845.5; IV Piggyback:250] Out: 4854 [OEVOJ:5009; Emesis/NG output:950; Drains:295] Intake/Output this shift: No intake/output data recorded.  PE: Gen: NAd Resp: nonlabored Card: RRR Abd: soft, nondistended ostomy with some gas in bag, open wound with clean base  Lab Results:  Recent Labs    08/10/21 0141  WBC 12.1*  HGB 9.3*  HCT 28.9*  PLT 356   BMET Recent Labs    08/10/21 0141  NA 144  K 3.9  CL 110  CO2 26  GLUCOSE 123*  BUN 16  CREATININE 1.04  CALCIUM 8.2*   PT/INR No results for input(s): LABPROT, INR in the last 72 hours. CMP     Component Value Date/Time   NA 144 08/10/2021 0141   K 3.9 08/10/2021 0141   CL 110 08/10/2021 0141   CO2 26 08/10/2021 0141   GLUCOSE 123 (H) 08/10/2021 0141   BUN 16 08/10/2021 0141   CREATININE 1.04 08/10/2021 0141   CALCIUM 8.2 (L) 08/10/2021 0141   PROT 8.0 08/06/2021 1922   ALBUMIN 3.7 08/06/2021 1922   AST 38 08/06/2021 1922   ALT 39 08/06/2021 1922   ALKPHOS 108 08/06/2021 1922   BILITOT 3.8 (H) 08/06/2021 1922   GFRNONAA >60 08/10/2021 0141   GFRAA >60 04/01/2020 1237   Lipase     Component Value Date/Time   LIPASE 22 08/06/2021 1922    Studies/Results: No  results found.  Anti-infectives: Anti-infectives (From admission, onward)    Start     Dose/Rate Route Frequency Ordered Stop   08/09/21 1600  ceFAZolin (ANCEF) IVPB 2g/100 mL premix        2 g 200 mL/hr over 30 Minutes Intravenous Every 8 hours 08/09/21 1501 08/17/21 1359   08/07/21 0600  piperacillin-tazobactam (ZOSYN) IVPB 3.375 g  Status:  Discontinued        3.375 g 12.5 mL/hr over 240 Minutes Intravenous Every 8 hours 08/07/21 0544 08/09/21 1501   08/06/21 2100  piperacillin-tazobactam (ZOSYN) IVPB 3.375 g        3.375 g 12.5 mL/hr over 240 Minutes Intravenous  Once 08/06/21 2057 08/07/21 0900       Assessment/Plan  s/p Procedure(s): EXPLORATORY LAPAROTOMY PARTIAL COLECTOMY WITH COLOSTOMY 08/08/2021  -NG output continues to be bilious and thick, dislodged yesterday and replaced  FEN - NPO, NG VTE - lovenox ID - e coli bacteremia, zosyn  1/5->1/7, ancef 1/7 --> Disposition - inpatient   LOS: 3 days   I reviewed last 24 h vitals and pain scores, last 48 h intake and output, last 24 h labs and trends, and last 24 h imaging results.  This care required moderate level of medical decision making.   [facility] 08/10/2021, 9:10 AM Please  see Amion for pager number during day hours 7:00am-4:30pm or 7:00am -11:30am on weekends

## 2021-08-11 DIAGNOSIS — E43 Unspecified severe protein-calorie malnutrition: Secondary | ICD-10-CM | POA: Insufficient documentation

## 2021-08-11 LAB — CBC
HCT: 28.8 % — ABNORMAL LOW (ref 39.0–52.0)
Hemoglobin: 9.6 g/dL — ABNORMAL LOW (ref 13.0–17.0)
MCH: 28.2 pg (ref 26.0–34.0)
MCHC: 33.3 g/dL (ref 30.0–36.0)
MCV: 84.7 fL (ref 80.0–100.0)
Platelets: 404 10*3/uL — ABNORMAL HIGH (ref 150–400)
RBC: 3.4 MIL/uL — ABNORMAL LOW (ref 4.22–5.81)
RDW: 13.5 % (ref 11.5–15.5)
WBC: 9.6 10*3/uL (ref 4.0–10.5)
nRBC: 0 % (ref 0.0–0.2)

## 2021-08-11 LAB — BASIC METABOLIC PANEL WITH GFR
Anion gap: 9 (ref 5–15)
BUN: 11 mg/dL (ref 6–20)
CO2: 26 mmol/L (ref 22–32)
Calcium: 8.2 mg/dL — ABNORMAL LOW (ref 8.9–10.3)
Chloride: 108 mmol/L (ref 98–111)
Creatinine, Ser: 0.91 mg/dL (ref 0.61–1.24)
GFR, Estimated: >60 mL/min
Glucose, Bld: 116 mg/dL — ABNORMAL HIGH (ref 70–99)
Potassium: 3.7 mmol/L (ref 3.5–5.1)
Sodium: 143 mmol/L (ref 135–145)

## 2021-08-11 LAB — CULTURE, BLOOD (ROUTINE X 2)
Culture: NO GROWTH
Culture: NO GROWTH
Special Requests: ADEQUATE

## 2021-08-11 MED ORDER — PALIPERIDONE PALMITATE ER 156 MG/ML IM SUSY
156.0000 mg | PREFILLED_SYRINGE | Freq: Once | INTRAMUSCULAR | Status: AC
  Administered 2021-08-12: 156 mg via INTRAMUSCULAR
  Filled 2021-08-11: qty 1

## 2021-08-11 MED ORDER — ACETAMINOPHEN 500 MG PO TABS
1000.0000 mg | ORAL_TABLET | Freq: Four times a day (QID) | ORAL | Status: DC
  Administered 2021-08-11 – 2021-08-22 (×21): 1000 mg via ORAL
  Filled 2021-08-11 (×28): qty 2

## 2021-08-11 MED ORDER — SODIUM CHLORIDE 0.9 % IV SOLN
3.0000 g | Freq: Four times a day (QID) | INTRAVENOUS | Status: AC
  Administered 2021-08-11 – 2021-08-17 (×24): 3 g via INTRAVENOUS
  Filled 2021-08-11 (×24): qty 8

## 2021-08-11 MED ORDER — PALIPERIDONE PALMITATE 156 MG/ML IM SUSP
156.0000 mg | INTRAMUSCULAR | Status: DC
Start: 2021-08-12 — End: 2021-08-11

## 2021-08-11 NOTE — Evaluation (Signed)
Occupational Therapy Evaluation Patient Details Name: Matthew [name] MRN: [id number] DOB: [birth year] Today's Date: 08/11/2021   History of Present Illness 56 y.o. male presenting to ED 1/4 with lower abdominal pain and N/V x1 week. Work-up (+) 2 perforations of sigmoid colon and SBO secondary to 7.1cm abscess. S/p ex lap 1/6 with partial colectomy and end colostomy. PMHx significant for schizophrenia.   Clinical Impression   Patient is very soft spoken and minimally verbal but able to provide home set-up and PLOF information. No family present at bedside. PTA patient was living with his mother in a private residence and was grossly I with ADLs/IADLs without AD. Patient currently functioning slightly below baseline demonstrating observed ADLs with Min guard to Min A overall. Patient also limited by deficits listed below including decreased dynamic standing balance and decreased activity tolerance and would benefit from continued acute OT services in prep for safe d/c home with family. Will need to confirm families ability to provide initial 24hr supervision/assist. OT will continue to follow acutely.       Recommendations for follow up therapy are one component of a multi-disciplinary discharge planning process, led by the attending physician.  Recommendations may be updated based on patient status, additional functional criteria and insurance authorization.   Follow Up Recommendations  Home health OT    Assistance Recommended at Discharge Frequent or constant Supervision/Assistance  Patient can return home with the following A little help with walking and/or transfers;A little help with bathing/dressing/bathroom;Assistance with cooking/housework;Help with stairs or ramp for entrance    Functional Status Assessment  Patient has had a recent decline in their functional status and demonstrates the ability to make significant improvements in function in a reasonable and predictable amount of time.   Equipment Recommendations  None recommended by OT    Recommendations for Other Services       Precautions / Restrictions Precautions Precautions: Fall Precaution Comments: NGT; JP drain to R abdomen; abdominal incision w/ precautions; colostomy Restrictions Weight Bearing Restrictions: No      Mobility Bed Mobility Overal bed mobility: Needs Assistance Bed Mobility: Rolling;Sidelying to Sit Rolling: Supervision Sidelying to sit: Supervision       General bed mobility comments: Supervision A for rolling to L and for supine to EOB for line management. Requires increased time to complete tasks.    Transfers Overall transfer level: Needs assistance Equipment used: None Transfers: Sit to/from Stand Sit to Stand: Min guard           General transfer comment: Min guard for sit to stand from EOB with cues for hand placement.      Balance Overall balance assessment: Needs assistance Sitting-balance support: No upper extremity supported;Feet supported Sitting balance-Leahy Scale: Fair Sitting balance - Comments: Did not challenge dynamic balance.   Standing balance support: No upper extremity supported;During functional activity Standing balance-Leahy Scale: Fair Standing balance comment: Min guard with dynamic balance; occasional mild LOB with mobility                           ADL either performed or assessed with clinical judgement   ADL Overall ADL's : Needs assistance/impaired Eating/Feeding: NPO   Grooming: Min guard;Standing   Upper Body Bathing: Set up;Sitting   Lower Body Bathing: Minimal assistance;Sit to/from stand   Upper Body Dressing : Set up;Sitting   Lower Body Dressing: Minimal assistance;Sit to/from stand   Toilet Transfer: Min guard  Vision Baseline Vision/History: 1 Wears glasses (Readers) Ability to See in Adequate Light: 0 Adequate Patient Visual Report: No change from baseline Vision Assessment?:  No apparent visual deficits     Perception     Praxis      Pertinent Vitals/Pain Pain Assessment: Faces Faces Pain Scale: Hurts a little bit Pain Location: Abdomen Pain Descriptors / Indicators: Aching;Sore Pain Intervention(s): Monitored during session     Hand Dominance Right   Extremity/Trunk Assessment Upper Extremity Assessment Upper Extremity Assessment: Overall WFL for tasks assessed   Lower Extremity Assessment Lower Extremity Assessment: Defer to PT evaluation       Communication Communication Communication: Other (comment) (Soft spoken; minimally verbal)   Cognition Arousal/Alertness: Awake/alert Behavior During Therapy: Flat affect Overall Cognitive Status: No family/caregiver present to determine baseline cognitive functioning                                 General Comments: A&Ox4; follows 1-step verbal commands with good accuracy; slow to respond.     General Comments  VSS on RA. NGT off suction. No family present at bedside.    Exercises     Shoulder Instructions      Home Living Family/patient expects to be discharged to:: Private residence Living Arrangements: Parent (Mother) Available Help at Discharge: Family Type of Home: House Home Access: Stairs to enter Technical brewer of Steps: 4 Entrance Stairs-Rails: Right Home Layout: One level     Bathroom Shower/Tub: Teacher, early years/pre: Standard     Home Equipment: None          Prior Functioning/Environment Prior Level of Function : Needs assist             Mobility Comments: Household and community mobility without AD. ADLs Comments: Reports ability to complete ADLs with I; microwaves meals; ?assist for med management?; reports that he does not work or drive.        OT Problem List: Decreased strength;Decreased activity tolerance;Impaired balance (sitting and/or standing);Decreased knowledge of precautions      OT  Treatment/Interventions: Self-care/ADL training;Therapeutic exercise;Energy conservation;DME and/or AE instruction;Therapeutic activities;Patient/family education;Balance training    OT Goals(Current goals can be found in the care plan section) Acute Rehab OT Goals Patient Stated Goal: No goals stated. OT Goal Formulation: With patient (Limited participation with goal setting.) Time For Goal Achievement: 08/25/21 Potential to Achieve Goals: Good ADL Goals Pt Will Perform Grooming: with modified independence;standing Pt Will Perform Upper Body Dressing: with modified independence Pt Will Perform Lower Body Dressing: with modified independence;sit to/from stand Pt Will Transfer to Toilet: with modified independence;ambulating;regular height toilet Pt Will Perform Toileting - Clothing Manipulation and hygiene: with modified independence;sit to/from stand Pt Will Perform Tub/Shower Transfer: Tub transfer;with modified independence Additional ADL Goal #1: Patient will maintain dymaic standing balance with Mod I in prep for ADLs.  OT Frequency: Min 2X/week    Co-evaluation              AM-PAC OT "6 Clicks" Daily Activity     Outcome Measure Help from another person eating meals?: Total (NPO) Help from another person taking care of personal grooming?: A Little Help from another person toileting, which includes using toliet, bedpan, or urinal?: A Little Help from another person bathing (including washing, rinsing, drying)?: A Little Help from another person to put on and taking off regular upper body clothing?: A Little Help from another person to put  on and taking off regular lower body clothing?: A Little 6 Click Score: 16   End of Session Equipment Utilized During Treatment: Gait belt Nurse Communication: Mobility status  Activity Tolerance: Patient tolerated treatment well Patient left: in chair;with call bell/phone within reach;with chair alarm set  OT Visit Diagnosis:  Unsteadiness on feet (R26.81);Muscle weakness (generalized) (M62.81)                Time: 1859-0931 OT Time Calculation (min): 24 min Charges:  OT General Charges $OT Visit: 1 Visit OT Evaluation $OT Eval Low Complexity: 1 Low OT Treatments $Therapeutic Activity: 8-22 mins  Demetre Monaco H. OTR/L Supplemental OT, Department of rehab services [phone]  Zulema Pulaski R H. 08/11/2021, 9:51 AM

## 2021-08-11 NOTE — Progress Notes (Signed)
Pharmacy - antimicrobial stewardship  Patient has ecoli bacteremia from a perforated sigmoid colon, POD#3.  Peritoneal culture showing the same [facility] and also some mixed anaerobic bacteria.  Asked Saverio Danker PA-C about changing to Unasyn to cover ecoli and anaerobes.  Orders received for Unasyn 3g IV q6h.  Will keep same stop date of 1/15

## 2021-08-11 NOTE — Consult Note (Signed)
[facility] Nurse ostomy follow up Patient receiving care in [name]. Stoma type/location: LMQ colostomy Stomal assessment/size: slightly oval, 1/5 inches, flat, 95% brown, remainder is pink Peristomal assessment: intact Treatment options for stomal/peristomal skin: barrier ring Output: air only Ostomy pouching: 1pc. convex Education provided: Patient observed the pouch change process.  He explained his mother, with whom he lives, does not drive, and he has a sister but does not know how to reach her. Enrolled patient in [place] Discharge program: No   The patient is very delayed in his verbal responses and has a very flat affect.  I am not sure the patient will be able to manage his ostomy care at home.  Keep the following ostomy supplies at the bedside. Roselee Culver #[id number], barrier rings Kellie Simmering [phone]  WOC will see again later this week.  m if needed.  Val Riles, RN, MSN, CWOCN, CNS-BC, pager [phone]

## 2021-08-11 NOTE — Progress Notes (Signed)
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  I have discussed these preliminary results with the patient and his mother (patient does not generally likely understand this concept).  The mother states she understands, but I'm not sure she fully understands all of these medical concepts.  All of her questions were answered.  We discussed outpatient oncology follow up and that I was going to work on getting that arranged for him.  Henreitta Cea 3:00 PM 08/11/2021

## 2021-08-11 NOTE — Progress Notes (Signed)
Mobility Specialist: Progress Note   08/11/21 1606  Mobility  Activity Ambulated in hall  Level of Assistance Independent  Assistive Device None  Distance Ambulated (ft) 470 ft  Mobility Ambulated independently in hallway  Mobility Response Tolerated well  Mobility performed by Mobility specialist  $Mobility charge 1 Mobility   Pre-Mobility: 94 HR, 100% SpO2 Post-Mobility: 93 HR  Pt independent with bed mobility as well as during ambulation. Pt experience LOB when returning to the room requiring contact guard to assist. Pt back to bed after walk with call bell and phone at his side. Bed alarm is on.   [facility] Matthew [name] Mobility Specialist Mobility Specialist 4 [place]: [phone] Mobility Specialist 2 [place] and [place]: [phone]

## 2021-08-11 NOTE — Progress Notes (Signed)
°  Transition of Care [facility]) Screening Note   Patient Details  Name: Matthew [name] Date of Birth: 1965-10-05   Transition of Care [facility]) CM/SW Contact:    Dawayne Patricia, RN Phone Number: 08/11/2021, 11:12 AM    Transition of Care Department [facility]) has reviewed patient and no TOC needs have been identified at this time. Pt is s/p explap with colostomy and wound care. PT/OT evals pending for recommendations.  We will continue to monitor patient advancement through interdisciplinary progression rounds. If new patient transition needs arise, please place a TOC consult.

## 2021-08-11 NOTE — Progress Notes (Signed)
3 Days Post-Op  Subjective: No new complaints today.  Doesn't talk much.  Says pain seems well controlled.  Not getting up out of bed.     Objective: Vital signs in last 24 hours: Temp:  [97.6 F (36.4 C)-98.7 F (37.1 C)] 98.4 F (36.9 C) (01/09 0819) Pulse Rate:  [50-63] 61 (01/09 0819) Resp:  [20] 20 (01/09 0819) BP: (113-123)/(62-84) 122/64 (01/09 0819) SpO2:  [97 %-100 %] 100 % (01/09 0819) Last BM Date:  (PTA)  Intake/Output from previous day: 01/08 0701 - 01/09 0700 In: 3214 [P.O.:240; I.V.:2674; IV Piggyback:300] Out: 3305 [Urine:1910; Emesis/NG output:1300; Drains:95] Intake/Output this shift: No intake/output data recorded.  PE: Heart: regular Lungs: CTAB Abd: soft, midline wound is clean and packed, LMQ colostomy with pink and viable stoma, some sweat in bag, but also a small amount of air.  BS are noted.  Minimal NGT output currently, but documented 1300cc out yesterday.  Lab Results:  Recent Labs    08/10/21 0141 08/11/21 0150  WBC 12.1* 9.6  HGB 9.3* 9.6*  HCT 28.9* 28.8*  PLT 356 404*   BMET Recent Labs    08/10/21 0141 08/11/21 0150  NA 144 143  K 3.9 3.7  CL 110 108  CO2 26 26  GLUCOSE 123* 116*  BUN 16 11  CREATININE 1.04 0.91  CALCIUM 8.2* 8.2*   PT/INR No results for input(s): LABPROT, INR in the last 72 hours. CMP     Component Value Date/Time   NA 143 08/11/2021 0150   K 3.7 08/11/2021 0150   CL 108 08/11/2021 0150   CO2 26 08/11/2021 0150   GLUCOSE 116 (H) 08/11/2021 0150   BUN 11 08/11/2021 0150   CREATININE 0.91 08/11/2021 0150   CALCIUM 8.2 (L) 08/11/2021 0150   PROT 8.0 08/06/2021 1922   ALBUMIN 3.7 08/06/2021 1922   AST 38 08/06/2021 1922   ALT 39 08/06/2021 1922   ALKPHOS 108 08/06/2021 1922   BILITOT 3.8 (H) 08/06/2021 1922   GFRNONAA >60 08/11/2021 0150   GFRAA >60 04/01/2020 1237   Lipase     Component Value Date/Time   LIPASE 22 08/06/2021 1922       Studies/Results: No results  found.  Anti-infectives: Anti-infectives (From admission, onward)    Start     Dose/Rate Route Frequency Ordered Stop   08/09/21 1600  ceFAZolin (ANCEF) IVPB 2g/100 mL premix        2 g 200 mL/hr over 30 Minutes Intravenous Every 8 hours 08/09/21 1501 08/17/21 1359   08/07/21 0600  piperacillin-tazobactam (ZOSYN) IVPB 3.375 g  Status:  Discontinued        3.375 g 12.5 mL/hr over 240 Minutes Intravenous Every 8 hours 08/07/21 0544 08/09/21 1501   08/06/21 2100  piperacillin-tazobactam (ZOSYN) IVPB 3.375 g        3.375 g 12.5 mL/hr over 240 Minutes Intravenous  Once 08/06/21 2057 08/07/21 0900        Assessment/Plan POD 3, 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 Kae Heller 08/08/21 -path still pending -NGT with minimal output currently, small amount of air in pouch, and some BS noted.  Will try a clamping trial today and see how he does with this -PT/OT for mobilization, also needs to be in a chair more  -cont abx therapy for E. Coli bacteremia, sensitive to ancef -cont BID dressing changes -WOC consult for new colostomy -WBC has normalized -IS/pulm toilet -multi-modal pain control  FEN - NPO/IVFs/NGT VTE -  Lovenox ID - ancef, stop date in place  Schizophrenia - medications have been ordered   LOS: 4 days    Henreitta Cea , [facility] 08/11/2021, 8:23 AM Please see Amion for pager number during day hours 7:00am-4:30pm or 7:00am -11:30am on weekends

## 2021-08-11 NOTE — Progress Notes (Signed)
Initial Nutrition Assessment  DOCUMENTATION CODES:   Severe malnutrition in context of acute illness/injury  INTERVENTION:  - Recommend initiation of TPN  NUTRITION DIAGNOSIS:   Severe Malnutrition related to acute illness (sigmoid perforation, SBO) as evidenced by moderate fat depletion, severe muscle depletion.  GOAL:   Provide needs based on ASPEN/SCCM guidelines  MONITOR:   Diet advancement, Labs, Weight trends, I & O's  REASON FOR ASSESSMENT:   Malnutrition Screening Tool    ASSESSMENT:   Pt admitted from home with abdominal pain x1 week secondary to 2 sites of sigmoid perforation and 7.1 cm abscess causing inflammation of terminal ileum which in turn is causing SBO. PMH includes schizophrenia  08/08/21: ex-lap, sigmoid colectomy with end colostomy  Noted clamping trial of NGT today.  Spoke with pt at bedside. Pt is a limited historian and unable to provide detailed diet and weight history. Reached out to Surgery PA regarding initiation of TPN as pt has remained NPO for 5 days and is noted to have moderate malnutrition. Pt also at risk for refeeding. Pt is agreeable to [employer] as diet advances but does not like Ensure.  Limited documentation of weight history. Current weight: 60.4 kg (133 lbs)  Pt meets criteria for severe acute malnutrition, however suspect underlying chronic malnutrition but unable to diagnose given limited diet and weight history.  Medications and labs reviewed and include: IV abx, LR with KCl 125ml/hr  NGT output: 1368mL x 24 hours R JP drain output: 61ml x 24 hours  NUTRITION - FOCUSED PHYSICAL EXAM:  Flowsheet Row Most Recent Value  Orbital Region Moderate depletion  Upper Arm Region Moderate depletion  Thoracic and Lumbar Region Moderate depletion  Buccal Region Moderate depletion  Temple Region Moderate depletion  Clavicle Bone Region Moderate depletion  Clavicle and Acromion Bone Region Mild depletion  Scapular Bone Region  Moderate depletion  Dorsal Hand Severe depletion  Patellar Region Severe depletion  Anterior Thigh Region Severe depletion  Posterior Calf Region Severe depletion  Edema (RD Assessment) None  Hair Reviewed  Eyes Reviewed  Mouth Reviewed  Skin Reviewed  Nails Reviewed       Diet Order:   Diet Order             Diet NPO time specified Except for: Ice Chips  Diet effective now                   EDUCATION NEEDS:   No education needs have been identified at this time  Skin:  Skin Assessment: Skin Integrity Issues: Skin Integrity Issues:: Incisions Incisions: abdomen  Last BM:  PTA  Height:   Ht Readings from Last 1 Encounters:  08/07/21 5\' 9"  (1.753 m)    Weight:   Wt Readings from Last 1 Encounters:  08/07/21 60.4 kg   BMI:  Body mass index is 19.66 kg/m.  Estimated Nutritional Needs:   Kcal:  2000-2200  Protein:  100-110g  Fluid:  >/=2.0L  Clayborne Dana, RDN, LDN Clinical Nutrition

## 2021-08-11 NOTE — Progress Notes (Signed)
Physical Therapy Evaluation Patient Details Name: Matthew [name] MRN: [id number] DOB: 08/30/1965 Today's Date: 08/11/2021  History of Present Illness  56 y.o. male presenting to ED 1/4 with lower abdominal pain and N/V x1 week. Work-up (+) 2 perforations of sigmoid colon and SBO secondary to 7.1cm abscess. S/p ex lap 1/6 with partial colectomy and end colostomy. PMHx significant for schizophrenia.  Clinical Impression  Pt admitted with/for SBO s/p repair as described above.  Pt generally supervision overall.  Pt currently limited functionally due to the problems listed below.  (see problems list.)  Pt will benefit from PT to maximize function and safety to be able to get home safely with available assist.        Recommendations for follow up therapy are one component of a multi-disciplinary discharge planning process, led by the attending physician.  Recommendations may be updated based on patient status, additional functional criteria and insurance authorization.  Follow Up Recommendations No PT follow up    Assistance Recommended at Discharge Intermittent Supervision/Assistance  Patient can return home with the following  A lot of help with walking and/or transfers;A lot of help with bathing/dressing/bathroom    Equipment Recommendations None recommended by PT (TBA)  Recommendations for Other Services       Functional Status Assessment Patient has had a recent decline in their functional status and demonstrates the ability to make significant improvements in function in a reasonable and predictable amount of time.     Precautions / Restrictions Precautions Precautions: Fall Precaution Comments: NGT; JP drain to R abdomen; abdominal incision w/ precautions; colostomy      Mobility  Bed Mobility Overal bed mobility: Needs Assistance Bed Mobility: Rolling;Sidelying to Sit Rolling: Supervision Sidelying to sit: Supervision       General bed mobility comments: Supervision A for  rolling to L and for supine to EOB for line management. Requires increased time to complete tasks.    Transfers Overall transfer level: Needs assistance Equipment used: None Transfers: Sit to/from Stand Sit to Stand: Min guard           General transfer comment: Min guard for sit to stand from EOB with cues for hand placement.    Ambulation/Gait Ambulation/Gait assistance: Supervision Gait Distance (Feet): 300 Feet Assistive device: IV Pole;None Gait Pattern/deviations: Step-through pattern   Gait velocity interpretation: 1.31 - 2.62 ft/sec, indicative of limited community ambulator   General Gait Details: mildly unsteady, but minor deviation only no LOB even with balance challenge including increasing gait speed, some scanning and abrupt changes in direction  Stairs Stairs: Yes Stairs assistance: Supervision Stair Management: One rail Left;Alternating pattern;Step to pattern;Forwards Number of Stairs: 4 General stair comments: safe with the rail  Wheelchair Mobility    Modified Rankin (Stroke Patients Only)       Balance Overall balance assessment: Needs assistance Sitting-balance support: No upper extremity supported;Feet supported Sitting balance-Leahy Scale: Fair Sitting balance - Comments: Did not challenge dynamic balance.   Standing balance support: No upper extremity supported;During functional activity Standing balance-Leahy Scale: Fair Standing balance comment: Min guard with dynamic balance; occasional mild deviation  with mobility but no overt LOB                             Pertinent Vitals/Pain Pain Assessment: Faces Faces Pain Scale: Hurts a little bit Pain Location: Abdomen Pain Descriptors / Indicators: Aching;Sore Pain Intervention(s): Monitored during session    Home Living Family/patient expects to  be discharged to:: Private residence Living Arrangements: Parent (Mother) Available Help at Discharge: Family Type of Home:  House Home Access: Stairs to enter Entrance Stairs-Rails: Right Entrance Stairs-Number of Steps: 4   Home Layout: One level Home Equipment: None      Prior Function Prior Level of Function : Needs assist             Mobility Comments: Household and community mobility without AD. ADLs Comments: Reports ability to complete ADLs with I; microwaves meals; ?assist for med management?; reports that he does not work or drive.     Hand Dominance   Dominant Hand: Right    Extremity/Trunk Assessment   Upper Extremity Assessment Upper Extremity Assessment: Overall WFL for tasks assessed    Lower Extremity Assessment Lower Extremity Assessment: Overall WFL for tasks assessed;Generalized weakness    Cervical / Trunk Assessment Cervical / Trunk Assessment: Normal  Communication   Communication: Other (comment) (Soft spoken; minimally verbal)  Cognition Arousal/Alertness: Awake/alert Behavior During Therapy: Flat affect Overall Cognitive Status: No family/caregiver present to determine baseline cognitive functioning                                 General Comments: A&Ox4; follows 1-step verbal commands with good accuracy; slow to respond.        General Comments General comments (skin integrity, edema, etc.): vss on RA    Exercises     Assessment/Plan    PT Assessment Patient needs continued PT services  PT Problem List Decreased strength;Decreased activity tolerance;Decreased balance;Decreased mobility;Pain       PT Treatment Interventions Gait training;Functional mobility training;Therapeutic activities;Balance training;Patient/family education    PT Goals (Current goals can be found in the Care Plan section)  Acute Rehab PT Goals Patient Stated Goal: pt fixated on getting water. PT Goal Formulation: Patient unable to participate in goal setting Time For Goal Achievement: 08/25/21 Potential to Achieve Goals: Good    Frequency Min 2X/week      Co-evaluation               AM-PAC PT "6 Clicks" Mobility  Outcome Measure Help needed turning from your back to your side while in a flat bed without using bedrails?: A Little Help needed moving from lying on your back to sitting on the side of a flat bed without using bedrails?: A Little Help needed moving to and from a bed to a chair (including a wheelchair)?: A Little Help needed standing up from a chair using your arms (e.g., wheelchair or bedside chair)?: A Little Help needed to walk in hospital room?: A Little Help needed climbing 3-5 steps with a railing? : A Little 6 Click Score: 18    End of Session   Activity Tolerance: Patient tolerated treatment well Patient left: in bed;with call bell/phone within reach Nurse Communication: Mobility status PT Visit Diagnosis: Unsteadiness on feet (R26.81)    Time: 7482-7078 PT Time Calculation (min) (ACUTE ONLY): 20 min   Charges:   PT Evaluation $PT Eval Moderate Complexity: 1 Mod          08/11/2021  Ginger Carne., PT Acute Rehabilitation Services [phone]  (pager) [phone]  (office)  Tessie Fass Mariama Saintvil 08/11/2021, 1:57 PM

## 2021-08-12 ENCOUNTER — Telehealth: Payer: Self-pay | Admitting: Hematology

## 2021-08-12 ENCOUNTER — Other Ambulatory Visit: Payer: Self-pay

## 2021-08-12 ENCOUNTER — Inpatient Hospital Stay: Payer: Self-pay

## 2021-08-12 LAB — HEPATIC FUNCTION PANEL
ALT: 17 U/L (ref 0–44)
AST: 24 U/L (ref 15–41)
Albumin: 1.9 g/dL — ABNORMAL LOW (ref 3.5–5.0)
Alkaline Phosphatase: 51 U/L (ref 38–126)
Bilirubin, Direct: 0.1 mg/dL (ref 0.0–0.2)
Indirect Bilirubin: 1.1 mg/dL — ABNORMAL HIGH (ref 0.3–0.9)
Total Bilirubin: 1.2 mg/dL (ref 0.3–1.2)
Total Protein: 5.2 g/dL — ABNORMAL LOW (ref 6.5–8.1)

## 2021-08-12 LAB — TRIGLYCERIDES: Triglycerides: 86 mg/dL (ref <150)

## 2021-08-12 LAB — CULTURE, BLOOD (ROUTINE X 2)
Culture: NO GROWTH
Special Requests: ADEQUATE

## 2021-08-12 LAB — CBC
HCT: 29.3 % — ABNORMAL LOW (ref 39.0–52.0)
Hemoglobin: 9.6 g/dL — ABNORMAL LOW (ref 13.0–17.0)
MCH: 27.5 pg (ref 26.0–34.0)
MCHC: 32.8 g/dL (ref 30.0–36.0)
MCV: 84 fL (ref 80.0–100.0)
Platelets: 485 10*3/uL — ABNORMAL HIGH (ref 150–400)
RBC: 3.49 MIL/uL — ABNORMAL LOW (ref 4.22–5.81)
RDW: 13.5 % (ref 11.5–15.5)
WBC: 9.4 10*3/uL (ref 4.0–10.5)
nRBC: 0.2 % (ref 0.0–0.2)

## 2021-08-12 LAB — BASIC METABOLIC PANEL
Anion gap: 11 (ref 5–15)
BUN: 9 mg/dL (ref 6–20)
CO2: 22 mmol/L (ref 22–32)
Calcium: 7.8 mg/dL — ABNORMAL LOW (ref 8.9–10.3)
Chloride: 108 mmol/L (ref 98–111)
Creatinine, Ser: 0.85 mg/dL (ref 0.61–1.24)
GFR, Estimated: >60 mL/min (ref >60)
Glucose, Bld: 108 mg/dL — ABNORMAL HIGH (ref 70–99)
Potassium: 3.6 mmol/L (ref 3.5–5.1)
Sodium: 141 mmol/L (ref 135–145)

## 2021-08-12 LAB — PHOSPHORUS: Phosphorus: 3.5 mg/dL (ref 2.5–4.6)

## 2021-08-12 LAB — MAGNESIUM: Magnesium: 1.9 mg/dL (ref 1.7–2.4)

## 2021-08-12 MED ORDER — MAGNESIUM SULFATE 2 GM/50ML IV SOLN
2.0000 g | Freq: Once | INTRAVENOUS | Status: AC
  Administered 2021-08-12: 2 g via INTRAVENOUS
  Filled 2021-08-12: qty 50

## 2021-08-12 MED ORDER — POTASSIUM CHLORIDE 10 MEQ/100ML IV SOLN
10.0000 meq | Freq: Once | INTRAVENOUS | Status: AC
  Administered 2021-08-12: 10 meq via INTRAVENOUS
  Filled 2021-08-12: qty 100

## 2021-08-12 MED ORDER — TRAVASOL 10 % IV SOLN
INTRAVENOUS | Status: AC
  Filled 2021-08-12: qty 367.2

## 2021-08-12 MED ORDER — SODIUM CHLORIDE 0.9% FLUSH
10.0000 mL | Freq: Two times a day (BID) | INTRAVENOUS | Status: DC
  Administered 2021-08-12 – 2021-08-13 (×3): 10 mL
  Administered 2021-08-13 – 2021-08-15 (×3): 20 mL
  Administered 2021-08-19 – 2021-08-20 (×2): 10 mL
  Administered 2021-08-20 – 2021-08-21 (×2): 20 mL
  Administered 2021-08-21 – 2021-08-22 (×2): 10 mL

## 2021-08-12 MED ORDER — INSULIN ASPART 100 UNIT/ML IJ SOLN
0.0000 [IU] | Freq: Four times a day (QID) | INTRAMUSCULAR | Status: DC
  Administered 2021-08-13 (×2): 1 [IU] via SUBCUTANEOUS
  Administered 2021-08-13 (×2): 2 [IU] via SUBCUTANEOUS
  Administered 2021-08-14: 1 [IU] via SUBCUTANEOUS
  Administered 2021-08-14 (×2): 2 [IU] via SUBCUTANEOUS
  Administered 2021-08-14 – 2021-08-15 (×4): 1 [IU] via SUBCUTANEOUS
  Administered 2021-08-15: 2 [IU] via SUBCUTANEOUS
  Administered 2021-08-16 – 2021-08-17 (×6): 1 [IU] via SUBCUTANEOUS
  Administered 2021-08-17: 2 [IU] via SUBCUTANEOUS
  Administered 2021-08-18 (×2): 1 [IU] via SUBCUTANEOUS
  Administered 2021-08-18: 2 [IU] via SUBCUTANEOUS
  Administered 2021-08-19: 1 [IU] via SUBCUTANEOUS

## 2021-08-12 MED ORDER — SODIUM CHLORIDE 0.9 % IV SOLN: INTRAVENOUS | Status: DC

## 2021-08-12 MED ORDER — POTASSIUM CHLORIDE 2 MEQ/ML IV SOLN
INTRAVENOUS | Status: DC
  Filled 2021-08-12 (×3): qty 1000

## 2021-08-12 MED ORDER — SODIUM CHLORIDE 0.9% FLUSH: 10.0000 mL | INTRAVENOUS | Status: DC | PRN

## 2021-08-12 NOTE — Progress Notes (Signed)
Documented 550 ml output from NG tube for dayshift. Nightshift documentation did not include 200 ml at beginning of shift. Payton Emerald, RN

## 2021-08-12 NOTE — Telephone Encounter (Signed)
Scheduled appt per 1/9 referral. Spoke to pt's mother who requested a letter with the appt information be sent. I verified address with pt's mother and sent letter today in the mail.

## 2021-08-12 NOTE — Progress Notes (Signed)
Spoke with patient and mother at length concerning plan of care.  Mother wanted to know what kind of cancer, what stage and what plan was.  After speaking to PA Saverio Danker, I explained to mother and patient that patient would follow up with cancer doctor in 2-3 weeks after he discharges.  He will get a CT (xray) before he leaves hospital for Dr. Burr Medico.  She is aware that he has started getting stool from colostomy but still has a lot of stuff still in his stomach. Payton Emerald, RN

## 2021-08-12 NOTE — Progress Notes (Signed)
PHARMACY - TOTAL PARENTERAL NUTRITION CONSULT NOTE   Indication: Prolonged ileus  Patient Measurements: Height: 5' 9"  (175.3 cm) Weight: 60.4 kg (133 lb 2.5 oz) IBW/kg (Calculated) : 70.7 TPN AdjBW (KG): 60.4 Body mass index is 19.66 kg/m. Usual Weight: unk   Assessment: 64 yom with schizophrenia admitted 1/4 with lower abdominal pain and N/V for 1 week found to have perforated diverticulitis with abscess on CT not amenable to perc drain. He is also being treated for E coli bacteremia. He had an ex-lap, sigmoid colectomy with end colostomy on 1/6. Of note, patient is a limited historian however mother reported weight loss of 30-40 lb in last year. Patient has been NPO for the last 6 days and noted to have moderate malnutrition per RD. He is also at risk for refeeding syndrome. Pharmacy consulted to manage TPN.  Glucose / Insulin: no hx DM, BG 108 this am Electrolytes: Na 141, K 3.6 (60 meq in IVF, goal >=4), Mg 1.9 (goal >=2), Phos 3.5, CoCa 9.5 Renal: SCr <1, BUN 9 Hepatic: LFTs/ TG / Tbili / Alk phos WNL, albumin 1.9 Intake / Output; MIVF: UOP 1.3 ml/kg/hr + 1 occurrence, NGT output 880 ml, emesis 400 ml, drain output 85 ml, LR+20K at 125 ml/hr GI Imaging: 1/5 CT A/P: Sigmoid colonic perforation with abscess. There are 2 separate points of perforation, possible underlying malignancy. 7.1 cm gas and fluid containing pericolonic abscess within the sigmoid mesentery. Marked inflammatory change of the terminal ileum adjacent to the pericolonic abscess with resultant small bowel obstruction  GI Surgeries / Procedures:  1/6: ex-lap, sigmoid colectomy with end colostomy  Central access: double lumen PICC 1/10 TPN start date: 1/10  Nutritional Goals: Goal TPN rate is 85 mL/hr (provides 104 g of protein and 2098 kcals per day)  RD Assessment: Estimated Needs Total Energy Estimated Needs: 2000-2200 Total Protein Estimated Needs: 100-110g Total Fluid Estimated Needs: >/=2.0L  Current  Nutrition:  NPO and TPN  Plan:  Start TPN at 50m/hr at 1800 (provides 36 g protein, 20 g lipis, 115 g dextrose and 740 kcal meeting ~35% needs) Electrolytes in TPN: Na 549m/L, K 5060mL, Ca 5mE61m, Mg 5mEq21m and Phos 15mmo86m Cl:Ac 1:1 Add standard MVI and trace elements to TPN Initiate Sensitive q6h SSI and adjust as needed  Reduce MIVF to 95 mL/hr at 1800 Monitor TPN labs on Mon/Thurs, in am  KCl 10 meq IV x1 Mg 2 g IV x1  Thank you for involving pharmacy in this patient's care.  JennifRenold GentamD, BCPS Clinical Pharmacist Clinical phone for 08/12/2021 until 3p is [phone] 9:35 AM  **Pharmacist phone directory can be found on [facility]ion[facility]

## 2021-08-12 NOTE — Progress Notes (Addendum)
4 Days Post-Op  Subjective: Said he had some nausea yesterday with NGT clamped.  Returned to [employer] and has had 1280cc out since that time.  Colostomy is starting to work with solid stool present.  Mobilized yesterday.  Some hiccups   Objective: Vital signs in last 24 hours: Temp:  [98 F (36.7 C)-99.1 F (37.3 C)] 98 F (36.7 C) (01/10 0710) Pulse Rate:  [63-77] 69 (01/10 0710) Resp:  [17-24] 20 (01/10 0710) BP: (102-123)/(63-82) 121/74 (01/10 0710) SpO2:  [98 %-100 %] 100 % (01/10 0710) Last BM Date:  (PTA)  Intake/Output from previous day: 01/09 0701 - 01/10 0700 In: 2808.4 [I.V.:2444.9; IV Piggyback:363.5] Out: 6294 [Urine:1950; Emesis/NG output:1280; Drains:85] Intake/Output this shift: No intake/output data recorded.  PE: Heart: regular Lungs: CTAB Abd: soft, midline wound is clean and packed, LMQ colostomy with pink and viable stoma, solid stool present in his bag, hypoactive BS are noted.  NGT with some bilious output, 1280 in last 12 hours or so.  Lab Results:  Recent Labs    08/11/21 0150 08/12/21 0315  WBC 9.6 9.4  HGB 9.6* 9.6*  HCT 28.8* 29.3*  PLT 404* 485*   BMET Recent Labs    08/11/21 0150 08/12/21 0315  NA 143 141  K 3.7 3.6  CL 108 108  CO2 26 22  GLUCOSE 116* 108*  BUN 11 9  CREATININE 0.91 0.85  CALCIUM 8.2* 7.8*   PT/INR No results for input(s): LABPROT, INR in the last 72 hours. CMP     Component Value Date/Time   NA 141 08/12/2021 0315   K 3.6 08/12/2021 0315   CL 108 08/12/2021 0315   CO2 22 08/12/2021 0315   GLUCOSE 108 (H) 08/12/2021 0315   BUN 9 08/12/2021 0315   CREATININE 0.85 08/12/2021 0315   CALCIUM 7.8 (L) 08/12/2021 0315   PROT 8.0 08/06/2021 1922   ALBUMIN 3.7 08/06/2021 1922   AST 38 08/06/2021 1922   ALT 39 08/06/2021 1922   ALKPHOS 108 08/06/2021 1922   BILITOT 3.8 (H) 08/06/2021 1922   GFRNONAA >60 08/12/2021 0315   GFRAA >60 04/01/2020 1237   Lipase     Component Value Date/Time   LIPASE 22  08/06/2021 1922       Studies/Results: [place] EKG SITE RITE  Result Date: 08/12/2021 If Site Rite image not attached, placement could not be confirmed due to current cardiac rhythm.   Anti-infectives: Anti-infectives (From admission, onward)    Start     Dose/Rate Route Frequency Ordered Stop   08/11/21 1230  Ampicillin-Sulbactam (UNASYN) 3 g in sodium chloride 0.9 % 100 mL IVPB        3 g 200 mL/hr over 30 Minutes Intravenous Every 6 hours 08/11/21 1135 08/17/21 1100   08/09/21 1600  ceFAZolin (ANCEF) IVPB 2g/100 mL premix  Status:  Discontinued        2 g 200 mL/hr over 30 Minutes Intravenous Every 8 hours 08/09/21 1501 08/11/21 1135   08/07/21 0600  piperacillin-tazobactam (ZOSYN) IVPB 3.375 g  Status:  Discontinued        3.375 g 12.5 mL/hr over 240 Minutes Intravenous Every 8 hours 08/07/21 0544 08/09/21 1501   08/06/21 2100  piperacillin-tazobactam (ZOSYN) IVPB 3.375 g        3.375 g 12.5 mL/hr over 240 Minutes Intravenous  Once 08/06/21 2057 08/07/21 0900        Assessment/Plan POD 4, 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 Kae Heller 08/08/21 -  path still pending, but prelim is invasive adenocarcinoma -referral already set up with Dr. Burr Medico for outpatient follow up -will need CT chest prior to discharge per onc request.  Will either do prior to discharge or with a CT A/P if he requires this post op. -NGT to remain to LIWS and continue today given high output since return to suction last night.  Ileus remains -mobilization, only HH OT recommended, no PT follow up -[place] RN orders placed to see if we can get this arranged for him. -cont abx therapy for E. Coli bacteremia, sensitive to ancef -cont BID dressing changes -WOC help appreciated for new colostomy, mother will need a lot of teaching prior to discharge -WBC has normalized, AF -IS/pulm toilet -multi-modal pain control -[name] ordered  FEN - NPO/IVFs/NGT/PICC/TNA VTE - Lovenox ID  - ancef, stop date in place  Schizophrenia - medications have been ordered   LOS: 5 days    Matthew [name] , [facility] 08/12/2021, 9:10 AM Please see Amion for pager number during day hours 7:00am-4:30pm or 7:00am -11:30am on weekends

## 2021-08-12 NOTE — Progress Notes (Signed)
Peripherally Inserted Central Catheter Placement  The IV Nurse has discussed with the patient and/or persons authorized to consent for the patient, the purpose of this procedure and the potential benefits and risks involved with this procedure.  The benefits include less needle sticks, lab draws from the catheter, and the patient may be discharged home with the catheter. Risks include, but not limited to, infection, bleeding, blood clot (thrombus formation), and puncture of an artery; nerve damage and irregular heartbeat and possibility to perform a PICC exchange if needed/ordered by physician.  Alternatives to this procedure were also discussed.  Bard Power PICC patient education guide, fact sheet on infection prevention and patient information card has been provided to patient /or left at bedside.    PICC Placement Documentation  PICC Double Lumen 08/12/21 PICC Right Brachial 40 cm 0 cm (Active)  Indication for Insertion or Continuance of Line Administration of hyperosmolar/irritating solutions (i.e. TPN, Vancomycin, etc.) 08/12/21 1100  Exposed Catheter (cm) 0 cm 08/12/21 1100  Site Assessment Clean;Dry;Intact 08/12/21 1100  Lumen #1 Status Flushed;Blood return noted 08/12/21 1100  Lumen #2 Status Flushed;Blood return noted 08/12/21 1100  Dressing Type Transparent 08/12/21 1100  Dressing Status Clean;Dry;Intact 08/12/21 1100  Antimicrobial disc in place? Yes 08/12/21 1100  Dressing Change Due 08/19/21 08/12/21 1100    Telephone consent   Jule Economy Horton 08/12/2021, 11:24 AM

## 2021-08-12 NOTE — Progress Notes (Signed)
Mid open abdominal wound with moist to dry dressing changed. Wound is clean and dry, no drainage.   Colostomy bag has no out put.   JP drain with serous drainage 85 ml total in 24 hours.   NG tube with low intermittent wall suction gastric content is bile green output total 1,280 ml total 24 hours.   Urin output 1,950 ml total in 24 hours.  Pt denied nausea/vomiting. He tolerated pain well. Continue NPO except med with water. He is hemodynamically stable, afebrile, no acute distress noted tonight. We will monitor.  Kennyth Lose, RN

## 2021-08-13 ENCOUNTER — Inpatient Hospital Stay (HOSPITAL_COMMUNITY): Payer: Medicaid Other

## 2021-08-13 LAB — BASIC METABOLIC PANEL
Anion gap: 5 (ref 5–15)
BUN: 6 mg/dL (ref 6–20)
CO2: 26 mmol/L (ref 22–32)
Calcium: 7.7 mg/dL — ABNORMAL LOW (ref 8.9–10.3)
Chloride: 108 mmol/L (ref 98–111)
Creatinine, Ser: 0.8 mg/dL (ref 0.61–1.24)
GFR, Estimated: >60 mL/min (ref >60)
Glucose, Bld: 141 mg/dL — ABNORMAL HIGH (ref 70–99)
Potassium: 3.7 mmol/L (ref 3.5–5.1)
Sodium: 139 mmol/L (ref 135–145)

## 2021-08-13 LAB — GLUCOSE, CAPILLARY
Glucose-Capillary: 139 mg/dL — ABNORMAL HIGH (ref 70–99)
Glucose-Capillary: 145 mg/dL — ABNORMAL HIGH (ref 70–99)
Glucose-Capillary: 153 mg/dL — ABNORMAL HIGH (ref 70–99)
Glucose-Capillary: 167 mg/dL — ABNORMAL HIGH (ref 70–99)

## 2021-08-13 LAB — CEA: CEA: <0.6 ng/mL (ref 0.0–4.7)

## 2021-08-13 LAB — MAGNESIUM: Magnesium: 2.2 mg/dL (ref 1.7–2.4)

## 2021-08-13 LAB — PHOSPHORUS: Phosphorus: 3.4 mg/dL (ref 2.5–4.6)

## 2021-08-13 MED ORDER — POTASSIUM CHLORIDE 10 MEQ/50ML IV SOLN
10.0000 meq | INTRAVENOUS | Status: AC
  Administered 2021-08-13 (×2): 10 meq via INTRAVENOUS
  Filled 2021-08-13 (×2): qty 50

## 2021-08-13 MED ORDER — POTASSIUM CHLORIDE 2 MEQ/ML IV SOLN
INTRAVENOUS | Status: AC
  Filled 2021-08-13: qty 1000

## 2021-08-13 MED ORDER — LACTATED RINGERS IV SOLN: INTRAVENOUS | Status: DC

## 2021-08-13 MED ORDER — TRAVASOL 10 % IV SOLN
INTRAVENOUS | Status: AC
  Filled 2021-08-13: qty 1040.4

## 2021-08-13 MED ORDER — POTASSIUM CHLORIDE 10 MEQ/100ML IV SOLN: 10.0000 meq | INTRAVENOUS | Status: DC

## 2021-08-13 NOTE — Progress Notes (Signed)
°  Intake documented total in 24 hours:  TPN and IV fluid 2,403.5 ml.  Output documented total 24 hours: NG tube with bile greenish gastric content 1,250 ml,  JP drain with serous drainage 100 ml. Urine out put 2,900 ml, yellow and clear. Colostomy: stool medium- large amount formed and soft, bag was emptied twice in 24 hours.  Colostomy bag and ostomy pouch changed due to stool leaked into mid abdominal wound.  Wound was cleaned and irrigated with NSS and packed with moist to dry gauze dressing. Dressing changed at 04:44 am.  Pt has been fully oriented x 4, cooperative with nursing care plan. His vital sings remain stable. He is afebrile. No acute distress noted. We will continue to monitor.  Kennyth Lose, RN

## 2021-08-13 NOTE — Progress Notes (Signed)
5 Days Post-Op  Subjective: NGT back to suction yesterday and had 550 out during day shift.  Appears to have put out more overnight.  Denies any pain or issues currently.  Pouch leaked overnight and had to be changed.   Objective: Vital signs in last 24 hours: Temp:  [97.6 F (36.4 C)-99.1 F (37.3 C)] 97.6 F (36.4 C) (01/11 0753) Pulse Rate:  [62-81] 78 (01/11 0753) Resp:  [16-20] 16 (01/11 0753) BP: (109-127)/(66-81) 117/72 (01/11 0753) SpO2:  [99 %-100 %] 100 % (01/11 0753) Last BM Date: 08/13/21  Intake/Output from previous day: 01/10 0701 - 01/11 0700 In: 2403.5 [I.V.:2066.9; IV Piggyback:336.6] Out: 4252 [Urine:2900; Emesis/NG output:1250; Drains:100; Stool:2] Intake/Output this shift: No intake/output data recorded.  PE: Heart: regular Lungs: CTAB Abd: soft, midline wound is clean and packed, LMQ colostomy with pink and viable stoma, bag just changed so not output currently.Marland Kitchen  NGT with some bilious output, only about 50cc in cannister currently.  Abdomen is completely flat with some BS  Lab Results:  Recent Labs    08/11/21 0150 08/12/21 0315  WBC 9.6 9.4  HGB 9.6* 9.6*  HCT 28.8* 29.3*  PLT 404* 485*   BMET Recent Labs    08/12/21 0315 08/13/21 0505  NA 141 139  K 3.6 3.7  CL 108 108  CO2 22 26  GLUCOSE 108* 141*  BUN 9 6  CREATININE 0.85 0.80  CALCIUM 7.8* 7.7*   PT/INR No results for input(s): LABPROT, INR in the last 72 hours. CMP     Component Value Date/Time   NA 139 08/13/2021 0505   K 3.7 08/13/2021 0505   CL 108 08/13/2021 0505   CO2 26 08/13/2021 0505   GLUCOSE 141 (H) 08/13/2021 0505   BUN 6 08/13/2021 0505   CREATININE 0.80 08/13/2021 0505   CALCIUM 7.7 (L) 08/13/2021 0505   PROT 5.2 (L) 08/12/2021 0315   ALBUMIN 1.9 (L) 08/12/2021 0315   AST 24 08/12/2021 0315   ALT 17 08/12/2021 0315   ALKPHOS 51 08/12/2021 0315   BILITOT 1.2 08/12/2021 0315   GFRNONAA >60 08/13/2021 0505   GFRAA >60 04/01/2020 1237   Lipase      Component Value Date/Time   LIPASE 22 08/06/2021 1922       Studies/Results: [place] EKG SITE RITE  Result Date: 08/12/2021 If Site Rite image not attached, placement could not be confirmed due to current cardiac rhythm.   Anti-infectives: Anti-infectives (From admission, onward)    Start     Dose/Rate Route Frequency Ordered Stop   08/11/21 1230  Ampicillin-Sulbactam (UNASYN) 3 g in sodium chloride 0.9 % 100 mL IVPB        3 g 200 mL/hr over 30 Minutes Intravenous Every 6 hours 08/11/21 1135 08/17/21 1559   08/09/21 1600  ceFAZolin (ANCEF) IVPB 2g/100 mL premix  Status:  Discontinued        2 g 200 mL/hr over 30 Minutes Intravenous Every 8 hours 08/09/21 1501 08/11/21 1135   08/07/21 0600  piperacillin-tazobactam (ZOSYN) IVPB 3.375 g  Status:  Discontinued        3.375 g 12.5 mL/hr over 240 Minutes Intravenous Every 8 hours 08/07/21 0544 08/09/21 1501   08/06/21 2100  piperacillin-tazobactam (ZOSYN) IVPB 3.375 g        3.375 g 12.5 mL/hr over 240 Minutes Intravenous  Once 08/06/21 2057 08/07/21 0900        Assessment/Plan POD 5, s/p Hartmann's procedure for perforated sigmoid colon with mesenteric  abscess, Dr. 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 Burr Medico for outpatient follow up -will need CT chest prior to discharge per onc request.  Will either do prior to discharge or with a CT A/P if he requires this post op. -NGT clamping again today and see how he does.  Did end up having more overnight making his output higher over last 24 hrs around 1200cc again; however, his abdomen is flat with BS and making a lot of stool in his bag.  Will order plain film to see location of NGT to see if it's too far down causing his high output given his gut is waking up. -mobilization, only HH OT recommended, no PT follow up -[facility] RN orders placed to see if we can get this arranged for him. -cont abx therapy for E.  Coli bacteremia, sensitive to ancef -cont BID dressing changes -WOC help appreciated for new colostomy, mother will need a lot of teaching prior to discharge -WBC has normalized, AF -IS/pulm toilet -multi-modal pain control -[name] ordered and normal  FEN - NPO x ice/sips of clears/IVFs/NGT/PICC/TNA VTE - Lovenox ID - ancef, stop date in place  Schizophrenia - medications have been ordered   LOS: 6 days    Matthew [name] , [facility] 08/13/2021, 8:57 AM Please see Amion for pager number during day hours 7:00am-4:30pm or 7:00am -11:30am on weekends

## 2021-08-13 NOTE — Progress Notes (Signed)
PHARMACY - TOTAL PARENTERAL NUTRITION CONSULT NOTE   Indication: Prolonged ileus  Patient Measurements: Height: 5\' 9"  (175.3 cm) Weight: 60.4 kg (133 lb 2.5 oz) IBW/kg (Calculated) : 70.7 TPN AdjBW (KG): 60.4 Body mass index is 19.66 kg/m. Usual Weight: unk   Assessment: 58 yom with schizophrenia admitted 1/4 with lower abdominal pain and N/V for 1 week found to have perforated diverticulitis with abscess on CT not amenable to perc drain. He is also being treated for E coli bacteremia. He had an ex-lap, sigmoid colectomy with end colostomy on 1/6. Of note, patient is a limited historian however mother reported weight loss of 30-40 lb in last year. Patient has been NPO for the last 6 days and noted to have moderate malnutrition per RD. He is also at risk for refeeding syndrome. Pharmacy consulted to manage TPN.  Glucose / Insulin: no hx DM, CBGs controlled <150. 2 units SSI utilized since TPN start Electrolytes: K stable 3.7 (s/p K run x 1 + KCl in IVF yesterday; goal >=4), Mg up to 2.2 (s/p Mag sulfate 2g IV x 1; yesterday; goal >=2), others stable WNL Renal: AKI resolving - SCr down to 0.8, BUN WNL Hepatic: LFTs / TG / Tbili WNL, albumin 1.9 Intake / Output; MIVF: UOP 2 ml/kg/hr, NGT output 400 ml, drain output 40 ml, colostomy bag with solid stool; MIVF: LR+20K at 95 ml/hr GI Imaging: 1/5 CT A/P: Sigmoid colonic perforation with abscess. There are 2 separate points of perforation, possible underlying malignancy. 7.1 cm gas and fluid containing pericolonic abscess within the sigmoid mesentery. Marked inflammatory change of the terminal ileum adjacent to the pericolonic abscess with resultant small bowel obstruction  GI Surgeries / Procedures:  1/6: ex-lap, sigmoid colectomy with end colostomy  Central access: double lumen PICC 1/10 TPN start date: 1/10  Nutritional Goals: Goal TPN rate is 85 mL/hr (provides 104 g of protein and 2098 kcals per day)  RD Assessment: Estimated  Needs Total Energy Estimated Needs: 2000-2200 Total Protein Estimated Needs: 100-110g Total Fluid Estimated Needs: >/=2.0L  Current Nutrition:  NPO w/ ice chips/sips of clears TPN  Plan:  Increase TPN to goal rate 85 mL/hr at 1800 TPN will provide 104 g protein, 57 g lipids, 326 g dextrose and 2098 kcal, meeting 100% of needs Electrolytes in TPN: Na 21mEq/L, increase K to 65mEq/L, Ca 67mEq/L, Mg 47mEq/L, and Phos 71mmol/L. Cl:Ac 1:1 (all lytes will increase with rate increase) Give K runs x 2 Add standard MVI and trace elements to TPN Continue Sensitive q6h SSI and adjust as needed  Remove KCl from IVF and supplement through TPN/outside the bag as needed. Reduce MIVF (LR) rate to 40 mL/hr at 1800 when new TPN bag hung Monitor TPN labs Mon/Thurs and PRN F/u Surgery team plans - NGT clamping trial again today   Arturo Morton, PharmD, BCPS Please check AMION for all [place] contact numbers Clinical Pharmacist 08/13/2021 7:52 AM

## 2021-08-13 NOTE — Consult Note (Signed)
[facility] Nurse ostomy follow up Patient receiving care in [place]. Stoma type/location: LMQ colostomy Pouch not changed today as it was documented as changed at [place] still in place without any leaking. Putting out green brown stool. Was able to see stoma through the pouch window and still appears brown/pink. Will order more supplies to be placed at bedside. No education provided today. WOC follow up once or twice weekly but not sure how this patient will comprehend and have the ability to provide self-care at home.   Cathlean Marseilles Tamala Julian, MSN, RN, [place], Lysle Pearl, [facility] Wound Treatment Associate Pager [phone]

## 2021-08-13 NOTE — Progress Notes (Signed)
Occupational Therapy Treatment Patient Details Name: Matthew [name] MRN: [id number] DOB: 26-Jul-1966 Today's Date: 08/13/2021   History of present illness 56 y.o. male presenting to ED 1/4 with lower abdominal pain and N/V x1 week. Work-up (+) 2 perforations of sigmoid colon and SBO secondary to 7.1cm abscess. S/p ex lap 1/6 with partial colectomy and end colostomy. PMHx significant for schizophrenia.   OT comments  Patient continues to make progress towards goals in skilled OT session. Patient's session encompassed functional mobility and ADLs sitting EOB. Patient is very participatory, but remains soft spoken and requires increased time to complete all tasks. Patient with increased HR to 128 when transitioning to EOB, with HR returning to 113 range. Patient with HR up to 137 BPM with each attempt of sit<>stands to date therefore patient returned to bed (RN informed and present at the end of session). Discharge remains appropriate, therapy will continue to follow.    Recommendations for follow up therapy are one component of a multi-disciplinary discharge planning process, led by the attending physician.  Recommendations may be updated based on patient status, additional functional criteria and insurance authorization.    Follow Up Recommendations  Home health OT    Assistance Recommended at Discharge Frequent or constant Supervision/Assistance  Patient can return home with the following  A little help with walking and/or transfers;A little help with bathing/dressing/bathroom;Assistance with cooking/housework;Help with stairs or ramp for entrance   Equipment Recommendations  None recommended by OT    Recommendations for Other Services      Precautions / Restrictions Precautions Precautions: Fall Precaution Comments: NGT; JP drain to R abdomen; abdominal incision w/ precautions; colostomy Restrictions Weight Bearing Restrictions: No       Mobility Bed Mobility Overal bed mobility:  Needs Assistance Bed Mobility: Supine to Sit;Sit to Supine Rolling: Supervision Sidelying to sit: Supervision       General bed mobility comments: Supervision A for rolling to L and for supine to EOB for line management. Requires increased time to complete tasks.    Transfers Overall transfer level: Needs assistance Equipment used: None Transfers: Sit to/from Stand Sit to Stand: Min guard           General transfer comment: Min guard for sit to stand from EOB with cues for line management     Balance Overall balance assessment: Needs assistance Sitting-balance support: No upper extremity supported;Feet supported Sitting balance-Leahy Scale: Fair Sitting balance - Comments: Did not challenge dynamic balance.   Standing balance support: No upper extremity supported;During functional activity Standing balance-Leahy Scale: Fair                             ADL either performed or assessed with clinical judgement   ADL Overall ADL's : Needs assistance/impaired Eating/Feeding: NPO   Grooming: Wash/dry hands;Wash/dry face;Sitting Grooming Details (indicate cue type and reason): Sitting EOB                 Toilet Transfer: Min guard           Functional mobility during ADLs: Minimal assistance General ADL Comments: Patient participatory but with fatigue and increased HR when completing 2 sit<>stands from bedside to 137, patient very soft spoken    Extremity/Trunk Assessment              Vision       Perception     Praxis      Cognition Arousal/Alertness: Awake/alert Behavior During Therapy: Flat  affect Overall Cognitive Status: No family/caregiver present to determine baseline cognitive functioning                                 General Comments: A&Ox4; follows 1-step verbal commands with good accuracy; slow to respond.          Exercises     Shoulder Instructions       General Comments      Pertinent Vitals/  Pain       Pain Assessment: Faces Faces Pain Scale: Hurts a little bit Pain Location: General discomfort with movement Pain Descriptors / Indicators: Aching;Sore Pain Intervention(s): Limited activity within patient's tolerance;Monitored during session;Repositioned  Home Living                                          Prior Functioning/Environment              Frequency  Min 2X/week        Progress Toward Goals  OT Goals(current goals can now be found in the care plan section)  Progress towards OT goals: Progressing toward goals  Acute Rehab OT Goals Patient Stated Goal: No goals stated OT Goal Formulation: With patient Time For Goal Achievement: 08/25/21 Potential to Achieve Goals: Good  Plan Discharge plan remains appropriate    Co-evaluation                 AM-PAC OT "6 Clicks" Daily Activity     Outcome Measure   Help from another person eating meals?: Total (NPO) Help from another person taking care of personal grooming?: A Little Help from another person toileting, which includes using toliet, bedpan, or urinal?: A Little Help from another person bathing (including washing, rinsing, drying)?: A Little Help from another person to put on and taking off regular upper body clothing?: A Little Help from another person to put on and taking off regular lower body clothing?: A Little 6 Click Score: 16    End of Session    OT Visit Diagnosis: Unsteadiness on feet (R26.81);Muscle weakness (generalized) (M62.81)   Activity Tolerance Patient tolerated treatment well   Patient Left in bed;with bed alarm set;with nursing/sitter in room   Nurse Communication Mobility status;Other (comment) (HR)        Time: 1103-1594 OT Time Calculation (min): 24 min  Charges: OT General Charges $OT Visit: 1 Visit OT Treatments $Self Care/Home Management : 23-37 mins  [place]. Jamilynn Whitacre, COTA/L Acute Rehabilitation  Services [phone] [place] 08/13/2021, 3:09 PM

## 2021-08-13 NOTE — Progress Notes (Addendum)
Mobility Specialist: Progress Note   08/13/21 1544  Mobility  Activity Ambulated in hall  Level of Assistance Contact guard assist, steadying assist  Distance Ambulated (ft) 330 ft  Mobility Ambulated with assistance in hallway  Mobility Response Tolerated well  Mobility performed by Mobility specialist  $Mobility charge 1 Mobility   Pre-Mobility: 94 HR Post-Mobility: 81 HR  Received pt in bed having no complaints and agreeable to mobility. Asymptomatic throughout ambulation, returned back to bed w/ call bell in reach and all needs met. Bed alarm is on.   Matthew [name] Mobility Specialist Mobility Specialist 4 [place]: [phone] Mobility Specialist 2 [place] and [place]: [phone]

## 2021-08-14 ENCOUNTER — Inpatient Hospital Stay (HOSPITAL_COMMUNITY): Payer: Medicaid Other

## 2021-08-14 LAB — COMPREHENSIVE METABOLIC PANEL
ALT: 41 U/L (ref 0–44)
AST: 45 U/L — ABNORMAL HIGH (ref 15–41)
Albumin: 2 g/dL — ABNORMAL LOW (ref 3.5–5.0)
Alkaline Phosphatase: 59 U/L (ref 38–126)
Anion gap: 8 (ref 5–15)
BUN: 7 mg/dL (ref 6–20)
CO2: 24 mmol/L (ref 22–32)
Calcium: 8 mg/dL — ABNORMAL LOW (ref 8.9–10.3)
Chloride: 107 mmol/L (ref 98–111)
Creatinine, Ser: 0.61 mg/dL (ref 0.61–1.24)
GFR, Estimated: >60 mL/min (ref >60)
Glucose, Bld: 128 mg/dL — ABNORMAL HIGH (ref 70–99)
Potassium: 4 mmol/L (ref 3.5–5.1)
Sodium: 139 mmol/L (ref 135–145)
Total Bilirubin: 0.6 mg/dL (ref 0.3–1.2)
Total Protein: 5.3 g/dL — ABNORMAL LOW (ref 6.5–8.1)

## 2021-08-14 LAB — PHOSPHORUS: Phosphorus: 3.5 mg/dL (ref 2.5–4.6)

## 2021-08-14 LAB — GLUCOSE, CAPILLARY
Glucose-Capillary: 129 mg/dL — ABNORMAL HIGH (ref 70–99)
Glucose-Capillary: 138 mg/dL — ABNORMAL HIGH (ref 70–99)
Glucose-Capillary: 154 mg/dL — ABNORMAL HIGH (ref 70–99)
Glucose-Capillary: 161 mg/dL — ABNORMAL HIGH (ref 70–99)

## 2021-08-14 LAB — CBC
HCT: 30.2 % — ABNORMAL LOW (ref 39.0–52.0)
Hemoglobin: 9.8 g/dL — ABNORMAL LOW (ref 13.0–17.0)
MCH: 27.4 pg (ref 26.0–34.0)
MCHC: 32.5 g/dL (ref 30.0–36.0)
MCV: 84.4 fL (ref 80.0–100.0)
Platelets: 637 10*3/uL — ABNORMAL HIGH (ref 150–400)
RBC: 3.58 MIL/uL — ABNORMAL LOW (ref 4.22–5.81)
RDW: 13.8 % (ref 11.5–15.5)
WBC: 13.6 10*3/uL — ABNORMAL HIGH (ref 4.0–10.5)
nRBC: 0 % (ref 0.0–0.2)

## 2021-08-14 LAB — MAGNESIUM: Magnesium: 2 mg/dL (ref 1.7–2.4)

## 2021-08-14 LAB — SURGICAL PATHOLOGY

## 2021-08-14 MED ORDER — TRAVASOL 10 % IV SOLN
INTRAVENOUS | Status: AC
  Filled 2021-08-14: qty 1040.4

## 2021-08-14 MED ORDER — IOHEXOL 9 MG/ML PO SOLN
ORAL | Status: AC
  Administered 2021-08-14: 500 mL
  Filled 2021-08-14: qty 1000

## 2021-08-14 MED ORDER — SODIUM CHLORIDE 0.9 % IV SOLN
12.5000 mg | Freq: Four times a day (QID) | INTRAVENOUS | Status: DC | PRN
  Administered 2021-08-14: 12.5 mg via INTRAVENOUS
  Filled 2021-08-14 (×2): qty 0.5

## 2021-08-14 MED ORDER — IOHEXOL 350 MG/ML SOLN
75.0000 mL | Freq: Once | INTRAVENOUS | Status: AC | PRN
  Administered 2021-08-14: 75 mL via INTRAVENOUS

## 2021-08-14 NOTE — Progress Notes (Signed)
Physical Therapy Treatment Patient Details Name: Matthew [name] MRN: [id number] DOB: 1965/11/30 Today's Date: 08/14/2021   History of Present Illness 56 y.o. male presenting to ED 1/4 with lower abdominal pain and N/V x1 week. Work-up (+) 2 perforations of sigmoid colon and SBO secondary to 7.1cm abscess. S/p ex lap 1/6 with partial colectomy and end colostomy. PMHx significant for schizophrenia.    PT Comments    The pt was able to make good progress with OOB mobility and ambulation at this time. He remains pleasant, cooperative, and able to follow simple cues/instructions but is soft spoken with a flat affect. The pt was able to complete multiple sit-stand from EOB and recliner without DME or need for physical assist, and completed 382ft hallway ambulation without overt LOB or UE support. May benefit from trial of [facility] to facilitate greater independence with ambulation.     Recommendations for follow up therapy are one component of a multi-disciplinary discharge planning process, led by the attending physician.  Recommendations may be updated based on patient status, additional functional criteria and insurance authorization.  Follow Up Recommendations  No PT follow up     Assistance Recommended at Discharge Intermittent Supervision/Assistance  Patient can return home with the following A lot of help with bathing/dressing/bathroom;A little help with walking and/or transfers;Assistance with cooking/housework;Help with stairs or ramp for entrance   Equipment Recommendations  None recommended by PT    Recommendations for Other Services       Precautions / Restrictions Precautions Precautions: Fall Precaution Comments: NGT; JP drain to R abdomen; abdominal incision w/ precautions; colostomy Restrictions Weight Bearing Restrictions: No     Mobility  Bed Mobility Overal bed mobility: Needs Assistance Bed Mobility: Sidelying to Sit   Sidelying to sit: Supervision       General  bed mobility comments: increased time but able to complete without assist.    Transfers Overall transfer level: Needs assistance Equipment used: None Transfers: Sit to/from Stand Sit to Stand: Min guard           General transfer comment: minG with increased time to power up, no instability with initial stand.    Ambulation/Gait Ambulation/Gait assistance: Min guard Gait Distance (Feet): 300 Feet Assistive device: None Gait Pattern/deviations: Step-through pattern Gait velocity: 0.48 m/s Gait velocity interpretation: 1.31 - 2.62 ft/sec, indicative of limited community ambulator   General Gait Details: pt with mild instability needing minA to steady at times with turns and balance challenges. no overt LOB         Balance Overall balance assessment: Needs assistance Sitting-balance support: No upper extremity supported;Feet supported Sitting balance-Leahy Scale: Fair Sitting balance - Comments: able to lean outside BOS for LB dressing   Standing balance support: No upper extremity supported;During functional activity Standing balance-Leahy Scale: Fair Standing balance comment: Min guard with dynamic balance; occasional mild deviation  with mobility but no overt LOB                            Cognition Arousal/Alertness: Awake/alert Behavior During Therapy: Flat affect Overall Cognitive Status: No family/caregiver present to determine baseline cognitive functioning                                 General Comments: follows simple cues/instructions. soft spoken but pleasant        Exercises      General Comments General comments (  skin integrity, edema, etc.): HR to 136bpm with hallway ambulation      Pertinent Vitals/Pain Pain Assessment: Faces Faces Pain Scale: Hurts a little bit Pain Location: General discomfort with movement Pain Descriptors / Indicators: Aching;Sore Pain Intervention(s): Limited activity within patient's  tolerance;Monitored during session;Repositioned     PT Goals (current goals can now be found in the care plan section) Acute Rehab PT Goals Patient Stated Goal: pt with flat affect, but agreeable and followed cues/instructions PT Goal Formulation: Patient unable to participate in goal setting Time For Goal Achievement: 08/25/21 Potential to Achieve Goals: Good Progress towards PT goals: Progressing toward goals    Frequency    Min 2X/week      PT Plan Current plan remains appropriate       AM-PAC PT "6 Clicks" Mobility   Outcome Measure  Help needed turning from your back to your side while in a flat bed without using bedrails?: A Little Help needed moving from lying on your back to sitting on the side of a flat bed without using bedrails?: A Little Help needed moving to and from a bed to a chair (including a wheelchair)?: A Little Help needed standing up from a chair using your arms (e.g., wheelchair or bedside chair)?: A Little Help needed to walk in hospital room?: A Little Help needed climbing 3-5 steps with a railing? : A Little 6 Click Score: 18    End of Session Equipment Utilized During Treatment: Gait belt Activity Tolerance: Patient tolerated treatment well Patient left: in chair;with call bell/phone within reach;with chair alarm set Nurse Communication: Mobility status PT Visit Diagnosis: Unsteadiness on feet (R26.81)     Time: 1657-9038 PT Time Calculation (min) (ACUTE ONLY): 25 min  Charges:  $Therapeutic Exercise: 23-37 mins                     [place], PT, DPT   Acute Rehabilitation Department Pager #: [phone]   Sandra Cockayne 08/14/2021, 12:16 PM

## 2021-08-14 NOTE — Progress Notes (Addendum)
Nutrition Follow-up  DOCUMENTATION CODES:   Severe malnutrition in context of acute illness/injury  INTERVENTION:   Continue TPN to meet 100% of nutrition needs.  NUTRITION DIAGNOSIS:   Severe Malnutrition related to acute illness (sigmoid perforation, SBO) as evidenced by moderate fat depletion, severe muscle depletion.  Ongoing   GOAL:   Provide needs based on ASPEN/SCCM guidelines  Met with TPN  MONITOR:   Diet advancement, Labs, Weight trends, I & O's  REASON FOR ASSESSMENT:   Malnutrition Screening Tool    ASSESSMENT:   Pt admitted from home with abdominal pain x1 week secondary to 2 sites of sigmoid perforation and 7.1 cm abscess causing inflammation of terminal ileum which in turn is causing SBO. PMH includes schizophrenia  NGT clamped yesterday. 400 ml green emesis this morning.  NG returned to [facility], then patient accidentally pulled NG, but RN replaced.  Ostomy output: none recorded x 24 hours, but there is some liquid stool in the bag.  JP drain with 18 ml output x 24 hours. UOP 1975 ml x 24 hours CT A/P has been ordered d/t increased WBC and abdominal distention.  TPN at 85 ml/h is providing 2097 kcal, 104 gm protein daily; meeting 100% of estimated nutrition needs.   Labs reviewed. K, Mag, and Phos WNL today. CBG: 138-154  Medications reviewed and include Novolog. Received 2 runs of KCl yesterday for K 3.7 IVF: LR at 40 ml/h  Diet Order:   Diet Order             Diet NPO time specified Except for: Ice Chips, Sips with Meds  Diet effective now                   EDUCATION NEEDS:   No education needs have been identified at this time  Skin:  Skin Assessment: Skin Integrity Issues: Skin Integrity Issues:: Incisions Incisions: abdomen  Last BM:  1/12 type 6  Height:   Ht Readings from Last 1 Encounters:  08/07/21 5' 9" (1.753 m)    Weight:   Wt Readings from Last 1 Encounters:  08/07/21 60.4 kg    BMI:  Body mass index is  19.66 kg/m.  Estimated Nutritional Needs:   Kcal:  2000-2200  Protein:  100-110g  Fluid:  >/=2.0L    Lucas Mallow RD, LDN, CNSC Please refer to Amion for contact information.

## 2021-08-14 NOTE — Progress Notes (Signed)
PHARMACY - TOTAL PARENTERAL NUTRITION CONSULT NOTE  Indication: Prolonged ileus  Patient Measurements: Height: 5\' 9"  (175.3 cm) Weight: 60.4 kg (133 lb 2.5 oz) IBW/kg (Calculated) : 70.7 TPN AdjBW (KG): 60.4 Body mass index is 19.66 kg/m.  Assessment:  58 yom with schizophrenia admitted 1/4 with lower abdominal pain and N/V for 1 week, found to have perforated diverticulitis with abscess on CT not amenable to perc drain. He is also being treated for E coli bacteremia. He had an ex-lap, sigmoid colectomy with end colostomy on 1/6. Of note, patient is a limited historian however mother reported weight loss of 30-40 lb in last year. Patient has been NPO for the last 6 days and noted to have moderate malnutrition per RD. He is also at risk for refeeding syndrome. Pharmacy consulted to manage TPN.  Glucose / Insulin: no hx DM, CBGs controlled < 180. Used 6 units SSI utilized Electrolytes: all WNL Renal: AKI resolved - SCr down to 0.61, BUN WNL Hepatic: LFTs stable, TG / tbili WNL, albumin 1.9 Intake / Output; MIVF: UOP 1.4 ml/kg/hr, drain 69mL, colostomy bag with solid stool;  LR at 40 ml/hr GI Imaging: -1/5 CT: sigmoid colonic perf with abscess; 2 separate points of perf, possible underlying malignancy. 7.1 cm gas and fluid containing pericolonic abscess within the sigmoid mesentery. Marked inflammatory change of terminal ileum adjacent to pericolonic abscess with resultant small bowel obstruction  -1/11 Abd XR: unchanged ileus GI Surgeries / Procedures:  1/6: ex-lap, sigmoid colectomy with end colostomy  Central access: double lumen PICC 08/12/21 TPN start date: 08/12/21  Nutritional Goals: RD Estimated Needs Total Energy Estimated Needs: 2000-2200 Total Protein Estimated Needs: 100-110g Total Fluid Estimated Needs: >/=2.0L  Current Nutrition:  TPN  Plan:  Continue TPN at goal rate 85 mL/hr to provide 104g AA, 57g ILE, 326g CHO and 2098 kCal, meeting 100% of needs Electrolytes in  TPN: Na 3mEq/L, increase K to 61mEq/L on 1/11, Ca 79mEq/L, Mg 68mEq/L, Phos 71mmol/L. Cl:Ac 1:1 - no change today Add standard MVI and trace elements to TPN Continue sensitive SSI Q6H LR at 40 mL/hr per MD Monitor standard TPN labs Mon/Thurs, labs in AM F/u Surgery team plans - NGT removed  Lenin Kuhnle D. Mina Marble, PharmD, BCPS, [place] 08/14/2021, 8:12 AM

## 2021-08-14 NOTE — Progress Notes (Signed)
Found colostomy bag leaking and dressing soiled. Changed new colostomy bag and abdominal dressing done. Pt stable.   Lavenia Atlas, RN

## 2021-08-14 NOTE — Progress Notes (Signed)
Tried both numbers in the chart to call and update patient's mother, Cobey Raineri.  No answer and no voicemail.    WBC up to 13 and ABX with worsening distention.  I have ordered a CT A/P to rule out post op complication/abscess/etc.  Discussed with RN  Henreitta Cea 11:27 AM 08/14/2021

## 2021-08-14 NOTE — Progress Notes (Signed)
Patient accidentally pulled out the NG tube. RN re-inserted the NG tube. PA notified. Waiting for the confirmation of the NG tube placement. Will continue to monitor the pt.   Lavenia Atlas, RN

## 2021-08-14 NOTE — Progress Notes (Signed)
6 Days Post-Op  Subjective: NGT has been clamped since yesterday.  Right before I walked in he apparently vomiting 400cc of green appearing fluid.  Returned NG to LIWS with NO output at all.  Patient really wants tube out.  Having ostomy output.   Objective: Vital signs in last 24 hours: Temp:  [98.2 F (36.8 C)-100.3 F (37.9 C)] 98.7 F (37.1 C) (01/12 0346) Pulse Rate:  [79-113] 88 (01/12 0346) Resp:  [17-20] 20 (01/12 0346) BP: (97-111)/(65-75) 97/65 (01/12 0346) SpO2:  [91 %-100 %] 91 % (01/12 0346) Last BM Date: 08/13/21  Intake/Output from previous day: 01/11 0701 - 01/12 0700 In: 2860.3 [I.V.:2140.3; NG/GT:120; IV Piggyback:600] Out: 1993 [Urine:1975; Drains:18] Intake/Output this shift: No intake/output data recorded.  PE: Heart: regular Lungs: CTAB Abd: soft, midline wound is clean but had stool from ostomy leak on it.  This was changed. LMQ colostomy with pink and viable stoma, bag with some liquid stool present.  NGT with no residual  Lab Results:  Recent Labs    08/12/21 0315  WBC 9.4  HGB 9.6*  HCT 29.3*  PLT 485*   BMET Recent Labs    08/13/21 0505 08/14/21 0405  NA 139 139  K 3.7 4.0  CL 108 107  CO2 26 24  GLUCOSE 141* 128*  BUN 6 7  CREATININE 0.80 0.61  CALCIUM 7.7* 8.0*   PT/INR No results for input(s): LABPROT, INR in the last 72 hours. CMP     Component Value Date/Time   NA 139 08/14/2021 0405   K 4.0 08/14/2021 0405   CL 107 08/14/2021 0405   CO2 24 08/14/2021 0405   GLUCOSE 128 (H) 08/14/2021 0405   BUN 7 08/14/2021 0405   CREATININE 0.61 08/14/2021 0405   CALCIUM 8.0 (L) 08/14/2021 0405   PROT 5.3 (L) 08/14/2021 0405   ALBUMIN 2.0 (L) 08/14/2021 0405   AST 45 (H) 08/14/2021 0405   ALT 41 08/14/2021 0405   ALKPHOS 59 08/14/2021 0405   BILITOT 0.6 08/14/2021 0405   GFRNONAA >60 08/14/2021 0405   GFRAA >60 04/01/2020 1237   Lipase     Component Value Date/Time   LIPASE 22 08/06/2021 1922        Studies/Results: DG Abd Portable 1V  Result Date: 08/13/2021 CLINICAL DATA:  Ileus EXAM: PORTABLE ABDOMEN - 1 VIEW COMPARISON:  Radiograph dated August 07, 2021 FINDINGS: NG/OG tube tip and side port project over the distal stomach. Drain overlying the lower abdomen. Numerous dilated loops of small and large bowel again seen. IMPRESSION: Numerous dilated loops of small and large bowel are similar to prior exam and likely due to ileus. Electronically Signed   By: Yetta Glassman M.D.   On: 08/13/2021 09:49   [place] EKG SITE RITE  Result Date: 08/12/2021 If Site Rite image not attached, placement could not be confirmed due to current cardiac rhythm.   Anti-infectives: Anti-infectives (From admission, onward)    Start     Dose/Rate Route Frequency Ordered Stop   08/11/21 1230  Ampicillin-Sulbactam (UNASYN) 3 g in sodium chloride 0.9 % 100 mL IVPB        3 g 200 mL/hr over 30 Minutes Intravenous Every 6 hours 08/11/21 1135 08/17/21 1559   08/09/21 1600  ceFAZolin (ANCEF) IVPB 2g/100 mL premix  Status:  Discontinued        2 g 200 mL/hr over 30 Minutes Intravenous Every 8 hours 08/09/21 1501 08/11/21 1135   08/07/21 0600  piperacillin-tazobactam (ZOSYN) IVPB  3.375 g  Status:  Discontinued        3.375 g 12.5 mL/hr over 240 Minutes Intravenous Every 8 hours 08/07/21 0544 08/09/21 1501   08/06/21 2100  piperacillin-tazobactam (ZOSYN) IVPB 3.375 g        3.375 g 12.5 mL/hr over 240 Minutes Intravenous  Once 08/06/21 2057 08/07/21 0900        Assessment/Plan POD 6, 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 Burr Medico for outpatient follow up -will need CT chest prior to discharge per onc request.  Will either do prior to discharge or with a CT A/P if he requires this post op. -NGT with no residual despite having just vomited.   Will check plain film today and check CBC and then determine what we are going to do with his NGT -mobilization, only HH OT recommended, no PT follow up -[facility] RN orders placed to see if we can get this arranged for him. -cont abx therapy for E. Coli bacteremia, sensitive to ancef -cont BID dressing changes -WOC help appreciated for new colostomy, mother will need a lot of teaching prior to discharge -WBC has normalized, AF -IS/pulm toilet -multi-modal pain control -[name]  normal  FEN - NPO x ice/sips of clears/IVFs/NGT/PICC/TNA VTE - Lovenox ID - ancef, stop date in place  Schizophrenia - medications have been ordered   LOS: 7 days    Matthew [name] , [facility] 08/14/2021, 9:06 AM Please see Amion for pager number during day hours 7:00am-4:30pm or 7:00am -11:30am on weekends

## 2021-08-15 ENCOUNTER — Inpatient Hospital Stay (HOSPITAL_COMMUNITY): Payer: Medicaid Other

## 2021-08-15 LAB — CBC
HCT: 29.1 % — ABNORMAL LOW (ref 39.0–52.0)
Hemoglobin: 9.4 g/dL — ABNORMAL LOW (ref 13.0–17.0)
MCH: 27.4 pg (ref 26.0–34.0)
MCHC: 32.3 g/dL (ref 30.0–36.0)
MCV: 84.8 fL (ref 80.0–100.0)
Platelets: 628 10*3/uL — ABNORMAL HIGH (ref 150–400)
RBC: 3.43 MIL/uL — ABNORMAL LOW (ref 4.22–5.81)
RDW: 14.3 % (ref 11.5–15.5)
WBC: 12.7 10*3/uL — ABNORMAL HIGH (ref 4.0–10.5)
nRBC: 0 % (ref 0.0–0.2)

## 2021-08-15 LAB — MAGNESIUM: Magnesium: 2.1 mg/dL (ref 1.7–2.4)

## 2021-08-15 LAB — BASIC METABOLIC PANEL
Anion gap: 6 (ref 5–15)
BUN: 10 mg/dL (ref 6–20)
CO2: 23 mmol/L (ref 22–32)
Calcium: 7.9 mg/dL — ABNORMAL LOW (ref 8.9–10.3)
Chloride: 109 mmol/L (ref 98–111)
Creatinine, Ser: 0.67 mg/dL (ref 0.61–1.24)
GFR, Estimated: >60 mL/min (ref >60)
Glucose, Bld: 125 mg/dL — ABNORMAL HIGH (ref 70–99)
Potassium: 4.3 mmol/L (ref 3.5–5.1)
Sodium: 138 mmol/L (ref 135–145)

## 2021-08-15 LAB — PHOSPHORUS: Phosphorus: 3.5 mg/dL (ref 2.5–4.6)

## 2021-08-15 LAB — GLUCOSE, CAPILLARY
Glucose-Capillary: 139 mg/dL — ABNORMAL HIGH (ref 70–99)
Glucose-Capillary: 142 mg/dL — ABNORMAL HIGH (ref 70–99)
Glucose-Capillary: 150 mg/dL — ABNORMAL HIGH (ref 70–99)
Glucose-Capillary: 159 mg/dL — ABNORMAL HIGH (ref 70–99)

## 2021-08-15 MED ORDER — TRAVASOL 10 % IV SOLN
INTRAVENOUS | Status: AC
  Filled 2021-08-15: qty 1040.4

## 2021-08-15 MED ORDER — ORAL CARE MOUTH RINSE
15.0000 mL | Freq: Two times a day (BID) | OROMUCOSAL | Status: DC
  Administered 2021-08-15 – 2021-08-22 (×12): 15 mL via OROMUCOSAL

## 2021-08-15 MED ORDER — IOHEXOL 300 MG/ML  SOLN
70.0000 mL | Freq: Once | INTRAMUSCULAR | Status: AC | PRN
  Administered 2021-08-15: 70 mL via INTRAVENOUS

## 2021-08-15 MED ORDER — CHLORHEXIDINE GLUCONATE 0.12 % MT SOLN
15.0000 mL | Freq: Two times a day (BID) | OROMUCOSAL | Status: DC
  Administered 2021-08-15 – 2021-08-22 (×14): 15 mL via OROMUCOSAL
  Filled 2021-08-15 (×13): qty 15

## 2021-08-15 NOTE — Progress Notes (Signed)
Occupational Therapy Treatment Patient Details Name: Matthew [name] MRN: [id number] DOB: August 03, 1966 Today's Date: 08/15/2021   History of present illness 56 y.o. male presenting to ED 1/4 with lower abdominal pain and N/V x1 week. Work-up (+) 2 perforations of sigmoid colon and SBO secondary to 7.1cm abscess. S/p ex lap 1/6 with partial colectomy and end colostomy. PMHx significant for schizophrenia.   OT comments  Patient continues to make progress towards goals in skilled OT session. Patient's session encompassed bed level ADLs and exercises in session. Patient with flatter affect in session, and less participatory than previous session, but when questioned patient did not state cause. Patient able to complete bed level exercises with extra time in between sets due to fatigue. Discharge recommendations remain the same, however if participation continues to dwindle recommendations may need to be changed to SNF. Therapy will continue to follow.    Recommendations for follow up therapy are one component of a multi-disciplinary discharge planning process, led by the attending physician.  Recommendations may be updated based on patient status, additional functional criteria and insurance authorization.    Follow Up Recommendations  Home health OT    Assistance Recommended at Discharge Frequent or constant Supervision/Assistance  Patient can return home with the following  A little help with walking and/or transfers;A little help with bathing/dressing/bathroom;Assistance with cooking/housework;Help with stairs or ramp for entrance   Equipment Recommendations  None recommended by OT    Recommendations for Other Services      Precautions / Restrictions Precautions Precautions: Fall Precaution Comments: NGT; JP drain to R abdomen; abdominal incision w/ precautions; colostomy Restrictions Weight Bearing Restrictions: No       Mobility Bed Mobility                    Transfers                          Balance                                           ADL either performed or assessed with clinical judgement   ADL Overall ADL's : Needs assistance/impaired Eating/Feeding: NPO   Grooming: Wash/dry hands;Wash/dry face;Bed level                               Functional mobility during ADLs: Minimal assistance General ADL Comments: Patient refusing mobility in session, but willing to complete bed level grooming and exercises    Extremity/Trunk Assessment              Vision       Perception     Praxis      Cognition Arousal/Alertness: Awake/alert Behavior During Therapy: Flat affect Overall Cognitive Status: No family/caregiver present to determine baseline cognitive functioning                                 General Comments: more flat in session to date, longer time needed to respond, but always responding approrpiately and following commands          Exercises General Exercises - Lower Extremity Ankle Circles/Pumps: AROM;Both;20 reps Quad Sets: AROM;Both;10 reps Straight Leg Raises: AROM;Both;10 reps   Shoulder Instructions  General Comments      Pertinent Vitals/ Pain       Pain Assessment: Faces Faces Pain Scale: Hurts a little bit Pain Location: General discomfort with movement Pain Descriptors / Indicators: Aching;Sore Pain Intervention(s): Limited activity within patient's tolerance;Monitored during session  Home Living                                          Prior Functioning/Environment              Frequency  Min 2X/week        Progress Toward Goals  OT Goals(current goals can now be found in the care plan section)  Progress towards OT goals: Progressing toward goals  Acute Rehab OT Goals Patient Stated Goal: None stated in session OT Goal Formulation: With patient Time For Goal Achievement: 08/25/21 Potential to Achieve  Goals: [place] Discharge plan remains appropriate    Co-evaluation                 AM-PAC OT "6 Clicks" Daily Activity     Outcome Measure   Help from another person eating meals?: Total (NPO) Help from another person taking care of personal grooming?: A Little Help from another person toileting, which includes using toliet, bedpan, or urinal?: A Little Help from another person bathing (including washing, rinsing, drying)?: A Little Help from another person to put on and taking off regular upper body clothing?: A Little Help from another person to put on and taking off regular lower body clothing?: A Little 6 Click Score: 16    End of Session    OT Visit Diagnosis: Unsteadiness on feet (R26.81);Muscle weakness (generalized) (M62.81)   Activity Tolerance Patient limited by fatigue;Patient limited by lethargy   Patient Left in bed;with call bell/phone within reach;with bed alarm set   Nurse Communication Mobility status;Other (comment) (less participatory than previous session with therapist)        Time: 9675-9163 OT Time Calculation (min): 14 min  Charges: OT General Charges $OT Visit: 1 Visit OT Treatments $Self Care/Home Management : 8-22 mins  Corinne Ports E. Larie Mathes, COTA/L Acute Rehabilitation Services [place] 08/15/2021, 2:16 PM

## 2021-08-15 NOTE — Progress Notes (Signed)
Mobility Specialist: Progress Note   08/15/21 1619  Mobility  Activity Ambulated in hall  Level of Assistance Independent  Assistive Device None  Distance Ambulated (ft) 330 ft  Mobility Ambulated independently in hallway  Mobility Response Tolerated well  Mobility performed by Mobility specialist  $Mobility charge 1 Mobility   Post-Mobility: 87 HR  Received pt in bed having no complaints and agreeable to mobility. Asymptomatic throughout ambulation, returned back to bed w/ call bell in reach and all needs met.   [facility] Yar[facility] Mobility Specialist Mobility Specialist [address] [place]: [phone] Mobility Specialist 2 [place] and [place]: [phone]

## 2021-08-15 NOTE — Progress Notes (Signed)
When the patient got back from his CT scan around 1945, he stated he was very nauseous.  He received IV Zofran 4 mg at 1811 but it did not affect him.  Shortly after attaching the NG tube back to the low intermittent suction, he started to vomit green bile.  Only a small amount came out of his mouth and about 400 mL was suctioned out by the NG tube.  The nurse informed the on call physician of [facility], Dr. Greer Pickerel, who ordered IV Phenergan 12.5 mg every six hours as needed for refractory vomiting.  The Phenergan was given and while out put continues through the NG tube, it has slowed down and the patient states his stomach feels better.  Will continue to monitor.   Lupita Dawn, RN

## 2021-08-15 NOTE — Progress Notes (Signed)
7 Days Post-Op  Subjective: No new complaints today.  CT scan did not show any evidence of post op infection or complication, just ileus.  1800cc out of NGT   Objective: Vital signs in last 24 hours: Temp:  [97.8 F (36.6 C)-99.6 F (37.6 C)] 98.1 F (36.7 C) (01/13 0725) Pulse Rate:  [90-105] 97 (01/13 0725) Resp:  [15-20] 20 (01/13 0725) BP: (100-113)/(63-73) 103/63 (01/13 0725) SpO2:  [97 %-100 %] 99 % (01/13 0725) Last BM Date: 08/14/21  Intake/Output from previous day: 01/12 0701 - 01/13 0700 In: 3041.3 [I.V.:2531.3; NG/GT:60; IV Piggyback:450] Out: 4118 [Urine:2275; Emesis/NG output:1800; Drains:40; Stool:3] Intake/Output this shift: Total I/O In: -  Out: 600 [Urine:600]  PE: Heart: regular Lungs: CTAB Abd: soft, midline wound is clean. LMQ colostomy with pink and viable stoma, no output currently in bag.  NGT with bilious output  Lab Results:  Recent Labs    08/14/21 1028 08/15/21 0410  WBC 13.6* 12.7*  HGB 9.8* 9.4*  HCT 30.2* 29.1*  PLT 637* 628*   BMET Recent Labs    08/14/21 0405 08/15/21 0410  NA 139 138  K 4.0 4.3  CL 107 109  CO2 24 23  GLUCOSE 128* 125*  BUN 7 10  CREATININE 0.61 0.67  CALCIUM 8.0* 7.9*   PT/INR No results for input(s): LABPROT, INR in the last 72 hours. CMP     Component Value Date/Time   NA 138 08/15/2021 0410   K 4.3 08/15/2021 0410   CL 109 08/15/2021 0410   CO2 23 08/15/2021 0410   GLUCOSE 125 (H) 08/15/2021 0410   BUN 10 08/15/2021 0410   CREATININE 0.67 08/15/2021 0410   CALCIUM 7.9 (L) 08/15/2021 0410   PROT 5.3 (L) 08/14/2021 0405   ALBUMIN 2.0 (L) 08/14/2021 0405   AST 45 (H) 08/14/2021 0405   ALT 41 08/14/2021 0405   ALKPHOS 59 08/14/2021 0405   BILITOT 0.6 08/14/2021 0405   GFRNONAA >60 08/15/2021 0410   GFRAA >60 04/01/2020 1237   Lipase     Component Value Date/Time   LIPASE 22 08/06/2021 1922       Studies/Results: CT ABDOMEN PELVIS W CONTRAST  Result Date: 08/14/2021 CLINICAL  DATA:  Postop post Hartmann's for perforated colon cancer, question abscess. Abdominal pain. EXAM: CT ABDOMEN AND PELVIS WITH CONTRAST TECHNIQUE: Multidetector CT imaging of the abdomen and pelvis was performed using the standard protocol following bolus administration of intravenous contrast. CONTRAST:  59mL OMNIPAQUE IOHEXOL 350 MG/ML SOLN COMPARISON:  Preoperative CT 08/06/2021 FINDINGS: Lower chest: Small bilateral pleural effusions and compressive atelectasis. Heart is normal in size. Dilated fluid-filled distal esophagus with enteric tube in place. Hepatobiliary: Tiny subcentimeter hypodensity in the right hepatic lobe, series 3, image 11. No other focal liver abnormality. Wall calcification of the gallbladder fundus, gallbladder partially distended. No pericholecystic inflammation or visualized gallstones. No biliary dilatation. Pancreas: No ductal dilatation or inflammation. Spleen: Normal in size without focal abnormality. Adrenals/Urinary Tract: No adrenal nodule. No hydronephrosis or perinephric edema. Homogeneous renal enhancement with symmetric excretion on delayed phase imaging. Left renal cyst as well as low-density lesion in the right kidney that is too small to characterize. Urinary bladder is physiologically distended without wall thickening. Focus of air in the nondependent bladder is likely related to recent Foley catheter placement. Stomach/Bowel: Enteric tube tip in the stomach. The stomach is dilated with air, contrast mixed with enteric contents. There is no gastric wall thickening. Mildly dilated contrast filled small bowel with no  bowel diameter measuring up to 4.1 cm. Administered contrast reaches the mid small bowel. More distal small bowel are mildly dilated and fluid-filled. Short segment of small bowel wall thickening and inflammation in the pelvis involving the distal ileum, series 3, image 64. No discrete transition point. Stapled off sigmoid colon in the pelvis. Fluid/liquid stool  in the cecum and ascending colon. There is formed stool in the hepatic flexure, transverse, and proximal descending colon. Nondistention versus wall thickening of the distal descending colon, with left lower quadrant colostomy. No peristomal hernia. Vascular/Lymphatic: Mild aortic atherosclerosis. Heterogeneous partially enhancing 14 mm noted in the retroperitoneum at the aortoiliac bifurcation, series 3, image 50. This is not significantly changed from prior exam. Suspected additional lymph nodes in the anterior common iliac space, series 3, image 54. There is scattered occasional mesenteric lymph nodes are not enlarged by size criteria. Reproductive: Prostate is unremarkable. Other: Small amount of non organized free fluid in the pelvis without peripheral enhancement. Mild generalized stranding in the pelvis adjacent to surgical sutures. Surgical drain entering in the right abdomen courses adjacent to small bowel, loops into the pelvis with tip terminating in the central mid pelvis. There is no fluid collection or fluid along the course of the drain. Two small residual foci of air within the pelvic mesentery with mild adjacent thickening, series 3 images 57 and 62, but no drainable collection. Single bubble of gas persists under the left lower quadrant ostomy. Post laparotomy. Open abdominal wound. No subcutaneous collection. Musculoskeletal: No focal bone lesion or acute osseous abnormality. IMPRESSION: 1. Post recent sigmoid colectomy with left lower quadrant colostomy. Two small residual foci of air within the pelvic mesentery with mild adjacent thickening, but no abscess or drainable collection. Trace non organized free fluid and stranding in the pelvis. 2. Short segment of small bowel wall thickening and inflammation in the pelvis involving the distal ileum, likely reactive. 3. Dilated distal esophagus, stomach, and small bowel, without discrete transition point, favoring postoperative ileus. 4. Small  bilateral pleural effusions and compressive atelectasis. 5. Heterogeneous partially enhancing 14 mm lymph node in the retroperitoneum at the aortoiliac bifurcation, not significantly changed from prior exam. Suspected additional lymph nodes in the anterior common iliac space, not significantly changed from prior exam. Recommend attention at follow-up. 6. Additional chronic findings as described. Aortic Atherosclerosis (ICD10-I70.0). Electronically Signed   By: Keith Rake M.D.   On: 08/14/2021 19:56   DG Abd Portable 1V  Result Date: 08/14/2021 CLINICAL DATA:  NG tube placement EXAM: PORTABLE ABDOMEN - 1 VIEW COMPARISON:  Radiographs dated August 14, 2021 at 9:38 a.m. FINDINGS: Multiple dilated small bowel loops concerning for ileus or obstruction. NG tube with distal tip about the gastric pylorus. No radio-opaque calculi or other significant radiographic abnormality are seen. IMPRESSION: Satisfactory positioning of the NG tube. Electronically Signed   By: Keane Police D.O.   On: 08/14/2021 15:19   DG Abd Portable 1V  Result Date: 08/14/2021 CLINICAL DATA:  Follow-up probable ileus. Status post laparotomy and partial colectomy on 08/08/2021 EXAM: PORTABLE ABDOMEN - 1 VIEW COMPARISON:  08/13/2021 FINDINGS: Nasogastric tube tip in the distal stomach in the region of the pylorus. Increase in caliber of multiple dilated, gas-filled small bowel loops. Normal caliber distal small bowel in the pelvis. Left lower quadrant ostomy. Stable drainage tube overlying the pelvis and inferior abdomen. Unremarkable bones. IMPRESSION: Progressive small bowel dilatation, concerning for partial obstruction. Electronically Signed   By: Claudie Revering M.D.   On: 08/14/2021 10:27  DG Abd Portable 1V  Result Date: 08/13/2021 CLINICAL DATA:  Ileus EXAM: PORTABLE ABDOMEN - 1 VIEW COMPARISON:  Radiograph dated August 07, 2021 FINDINGS: NG/OG tube tip and side port project over the distal stomach. Drain overlying the lower  abdomen. Numerous dilated loops of small and large bowel again seen. IMPRESSION: Numerous dilated loops of small and large bowel are similar to prior exam and likely due to ileus. Electronically Signed   By: Yetta Glassman M.D.   On: 08/13/2021 09:49    Anti-infectives: Anti-infectives (From admission, onward)    Start     Dose/Rate Route Frequency Ordered Stop   08/11/21 1230  Ampicillin-Sulbactam (UNASYN) 3 g in sodium chloride 0.9 % 100 mL IVPB        3 g 200 mL/hr over 30 Minutes Intravenous Every 6 hours 08/11/21 1135 08/17/21 1559   08/09/21 1600  ceFAZolin (ANCEF) IVPB 2g/100 mL premix  Status:  Discontinued        2 g 200 mL/hr over 30 Minutes Intravenous Every 8 hours 08/09/21 1501 08/11/21 1135   08/07/21 0600  piperacillin-tazobactam (ZOSYN) IVPB 3.375 g  Status:  Discontinued        3.375 g 12.5 mL/hr over 240 Minutes Intravenous Every 8 hours 08/07/21 0544 08/09/21 1501   08/06/21 2100  piperacillin-tazobactam (ZOSYN) IVPB 3.375 g        3.375 g 12.5 mL/hr over 240 Minutes Intravenous  Once 08/06/21 2057 08/07/21 0900        Assessment/Plan POD 7, 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 Burr Medico for outpatient follow up -CT chest for staging -cont NGT for post op ileus, no infectious or other complications noted on CT -mobilization, only HH OT recommended, no PT follow up -[facility] RN orders placed to see if we can get this arranged for him. -cont abx therapy for E. Coli bacteremia, sensitive to ancef -cont BID dressing changes -WOC help appreciated for new colostomy, mother will need a lot of teaching prior to discharge -WBC down trending, AF -IS/pulm toilet -multi-modal pain control -CEA  normal  FEN - NPO x iceIVFs/NGT/PICC/TNA VTE - Lovenox ID - ancef, stop date in place (bacteremia)  Schizophrenia -  medications have been ordered   LOS: 8 days    Henreitta Cea , [facility] Surgery 08/15/2021, 8:24 AM Please see Amion for pager number during day hours 7:00am-4:30pm or 7:00am -11:30am on weekends

## 2021-08-15 NOTE — Progress Notes (Signed)
PHARMACY - TOTAL PARENTERAL NUTRITION CONSULT NOTE  Indication: Prolonged ileus  Patient Measurements: Height: 5\' 9"  (175.3 cm) Weight: 60.4 kg (133 lb 2.5 oz) IBW/kg (Calculated) : 70.7 TPN AdjBW (KG): 60.4 Body mass index is 19.66 kg/m.  Assessment:  79 yom with schizophrenia admitted 1/4 with lower abdominal pain and N/V for 1 week, found to have perforated diverticulitis with abscess on CT not amenable to perc drain. He is also being treated for E coli bacteremia. He had an ex-lap, sigmoid colectomy with end colostomy on 1/6. Of note, patient is a limited historian however mother reported weight loss of 30-40 lb in last year. Patient has been NPO for the last 6 days and noted to have moderate malnutrition per RD. He is also at risk for refeeding syndrome. Pharmacy consulted to manage TPN.  Glucose / Insulin: no hx DM, CBGs controlled < 180. Used 6 units SSI in last 24h Electrolytes: all WNL Renal: AKI resolved - SCr down to 0.6s, BUN WNL Hepatic: LFTs stable, TG / tbili WNL, albumin 2 Intake / Output; MIVF: UOP 1.6 ml/kg/hr, drain 60mL, NGT bilious output 1831mL, colostomy bag with 81mL, emesis x1;  LR at 40 ml/hr GI Imaging: -1/5 CT: sigmoid colonic perf with abscess; 2 separate points of perf, possible underlying malignancy. 7.1 cm gas and fluid containing pericolonic abscess within the sigmoid mesentery. Marked inflammatory change of terminal ileum adjacent to pericolonic abscess with resultant small bowel obstruction  -1/11 Abd XR: unchanged ileus -1/12 CT: ileus, no drainable abscess or collection GI Surgeries / Procedures:  1/6: ex-lap, sigmoid colectomy with end colostomy  Central access: double lumen PICC 08/12/21 TPN start date: 08/12/21  Nutritional Goals: RD Estimated Needs Total Energy Estimated Needs: 2000-2200 Total Protein Estimated Needs: 100-110g Total Fluid Estimated Needs: >/=2.0L  Current Nutrition:  TPN  Plan:  Continue TPN at goal rate 85 mL/hr to provide  104g AA, 57g ILE, 326g CHO and 2098 kCal, meeting 100% of needs Electrolytes in TPN: Na 13mEq/L, increase K to 69mEq/L on 1/11, Ca 75mEq/L, Mg 4mEq/L, Phos 15mmol/L. Cl:Ac 1:1 - no change today Add standard MVI and trace elements to TPN Continue sensitive SSI Q6H LR at 40 mL/hr per MD Monitor standard TPN labs Mon/Thurs, BMP in am  Thank you for involving pharmacy in this patient's care.  Renold Genta, PharmD, BCPS Clinical Pharmacist Clinical phone for 08/15/2021 until 3p is [phone] 08/15/2021 7:15 AM  **Pharmacist phone directory can be found on [email or web address] listed under [place]**

## 2021-08-16 LAB — CBC
HCT: 28 % — ABNORMAL LOW (ref 39.0–52.0)
Hemoglobin: 9.1 g/dL — ABNORMAL LOW (ref 13.0–17.0)
MCH: 27.7 pg (ref 26.0–34.0)
MCHC: 32.5 g/dL (ref 30.0–36.0)
MCV: 85.1 fL (ref 80.0–100.0)
Platelets: 647 10*3/uL — ABNORMAL HIGH (ref 150–400)
RBC: 3.29 MIL/uL — ABNORMAL LOW (ref 4.22–5.81)
RDW: 14.5 % (ref 11.5–15.5)
WBC: 12.5 10*3/uL — ABNORMAL HIGH (ref 4.0–10.5)
nRBC: 0 % (ref 0.0–0.2)

## 2021-08-16 LAB — GLUCOSE, CAPILLARY
Glucose-Capillary: 130 mg/dL — ABNORMAL HIGH (ref 70–99)
Glucose-Capillary: 137 mg/dL — ABNORMAL HIGH (ref 70–99)
Glucose-Capillary: 145 mg/dL — ABNORMAL HIGH (ref 70–99)
Glucose-Capillary: 147 mg/dL — ABNORMAL HIGH (ref 70–99)
Glucose-Capillary: 147 mg/dL — ABNORMAL HIGH (ref 70–99)

## 2021-08-16 LAB — BASIC METABOLIC PANEL
Anion gap: 9 (ref 5–15)
BUN: 11 mg/dL (ref 6–20)
CO2: 23 mmol/L (ref 22–32)
Calcium: 8.2 mg/dL — ABNORMAL LOW (ref 8.9–10.3)
Chloride: 105 mmol/L (ref 98–111)
Creatinine, Ser: 0.68 mg/dL (ref 0.61–1.24)
GFR, Estimated: >60 mL/min (ref >60)
Glucose, Bld: 119 mg/dL — ABNORMAL HIGH (ref 70–99)
Potassium: 4.2 mmol/L (ref 3.5–5.1)
Sodium: 137 mmol/L (ref 135–145)

## 2021-08-16 MED ORDER — TRAVASOL 10 % IV SOLN
INTRAVENOUS | Status: AC
  Filled 2021-08-16: qty 1040.4

## 2021-08-16 NOTE — Progress Notes (Signed)
PHARMACY - TOTAL PARENTERAL NUTRITION CONSULT NOTE  Indication: Prolonged ileus  Patient Measurements: Height: 5\' 9"  (175.3 cm) Weight: 60.4 kg (133 lb 2.5 oz) IBW/kg (Calculated) : 70.7 TPN AdjBW (KG): 60.4 Body mass index is 19.66 kg/m.  Assessment:  72 yom with schizophrenia admitted 1/4 with lower abdominal pain and N/V for 1 week, found to have perforated diverticulitis with abscess on CT not amenable to perc drain. He is also being treated for E coli bacteremia. He had an ex-lap, sigmoid colectomy with end colostomy on 1/6. Of note, patient is a limited historian however mother reported weight loss of 30-40 lb in last year. Patient has been NPO for the last 6 days and noted to have moderate malnutrition per RD. He is also at risk for refeeding syndrome. Pharmacy consulted to manage TPN.  Glucose / Insulin: no hx DM, CBGs controlled < 180. Used 5 units SSI in last 24hrs Electrolytes: all WNL (Na downtrending) Renal: AKI resolved - SCr down to 0.6s, BUN WNL Hepatic: LFTs stable, TG / tbili WNL, albumin 2 Intake / Output; MIVF: UOP 1.7 ml/kg/hr, drain 31mL, NGT 382mL, no colostomy output, emesis x1 on 1/12;  LR at 40 ml/hr GI Imaging: -1/5 CT: sigmoid colonic perf with abscess; 2 separate points of perf, possible underlying malignancy. 7.1 cm gas and fluid containing pericolonic abscess within the sigmoid mesentery. Marked inflammatory change of terminal ileum adjacent to pericolonic abscess with resultant small bowel obstruction  -1/11 Abd XR: unchanged ileus -1/12 CT: ileus, no drainable abscess or collection GI Surgeries / Procedures:  1/6: ex-lap, sigmoid colectomy with end colostomy  Central access: double lumen PICC 08/12/21 TPN start date: 08/12/21  Nutritional Goals: RD Estimated Needs Total Energy Estimated Needs: 2000-2200 Total Protein Estimated Needs: 100-110g Total Fluid Estimated Needs: >/=2.0L  Current Nutrition:  TPN  Plan:  Continue TPN at goal rate 85 mL/hr  to provide 104g AA, 57g ILE, 326g CHO and 2098 kCal, meeting 100% of needs Electrolytes in TPN: increase Na to 24mEq/L, K 70mEq/L, Ca 24mEq/L, Mag 1mEq/L, Phos 4mmol/L. Cl:Ac 1:1 Add standard MVI and trace elements to TPN Continue sensitive SSI Q6H LR at 40 mL/hr per MD Standard TPN labs Mon/Thurs  Macall Mccroskey D. Mina Marble, PharmD, BCPS, [place] 08/16/2021, 8:00 AM

## 2021-08-16 NOTE — Progress Notes (Signed)
8 Days Post-Op  Subjective: No new complaints today.  CT scan did not show any evidence of post op infection or complication, just ileus.  1.8L out of NG 1/12, came out overnight 1/14. Denies nausea, vomiting or significant distention. Colostomy has began working - liquid stool and gas.   Objective: Vital signs in last 24 hours: Temp:  [98.1 F (36.7 C)-99.1 F (37.3 C)] 98.1 F (36.7 C) (01/14 0750) Pulse Rate:  [85-94] 86 (01/14 0750) Resp:  [16-20] 18 (01/14 0750) BP: (87-101)/(60-65) 92/65 (01/14 0750) SpO2:  [97 %-99 %] 99 % (01/14 0750) Last BM Date: 08/16/21  Intake/Output from previous day: 01/13 0701 - 01/14 0700 In: 3051.4 [I.V.:2621.4; NG/GT:30; IV Piggyback:400] Out: 2780 [Urine:2420; Emesis/NG output:350; Drains:10] Intake/Output this shift: Total I/O In: -  Out: 300 [Urine:300]  PE: Heart: regular Lungs: CTAB Abd: soft, midline wound is clean. Mildly distended. LMQ colostomy with pink and viable stoma, thin stool in appliance.   Lab Results:  Recent Labs    08/15/21 0410 08/16/21 0420  WBC 12.7* 12.5*  HGB 9.4* 9.1*  HCT 29.1* 28.0*  PLT 628* 647*   BMET Recent Labs    08/15/21 0410 08/16/21 0420  NA 138 137  K 4.3 4.2  CL 109 105  CO2 23 23  GLUCOSE 125* 119*  BUN 10 11  CREATININE 0.67 0.68  CALCIUM 7.9* 8.2*   PT/INR No results for input(s): LABPROT, INR in the last 72 hours. CMP     Component Value Date/Time   NA 137 08/16/2021 0420   K 4.2 08/16/2021 0420   CL 105 08/16/2021 0420   CO2 23 08/16/2021 0420   GLUCOSE 119 (H) 08/16/2021 0420   BUN 11 08/16/2021 0420   CREATININE 0.68 08/16/2021 0420   CALCIUM 8.2 (L) 08/16/2021 0420   PROT 5.3 (L) 08/14/2021 0405   ALBUMIN 2.0 (L) 08/14/2021 0405   AST 45 (H) 08/14/2021 0405   ALT 41 08/14/2021 0405   ALKPHOS 59 08/14/2021 0405   BILITOT 0.6 08/14/2021 0405   GFRNONAA >60 08/16/2021 0420   GFRAA >60 04/01/2020 1237   Lipase     Component Value Date/Time   LIPASE 22  08/06/2021 1922       Studies/Results: CT CHEST W CONTRAST  Result Date: 08/15/2021 CLINICAL DATA:  Colon cancer, metastatic staging EXAM: CT CHEST WITH CONTRAST TECHNIQUE: Multidetector CT imaging of the chest was performed during intravenous contrast administration. RADIATION DOSE REDUCTION: This exam was performed according to the departmental dose-optimization program which includes automated exposure control, adjustment of the mA and/or kV according to patient size and/or use of iterative reconstruction technique. CONTRAST:  26mL OMNIPAQUE IOHEXOL 300 MG/ML  SOLN COMPARISON:  CT abdomen pelvis 08/14/2021 FINDINGS: Cardiovascular: No significant vascular findings. Normal heart size. No pericardial effusion. PICC line extends into the distal SVC Mediastinum/Nodes: NG tube esophagus. Trachea normal. No mediastinal adenopathy. No axillary or supraclavicular adenopathy. Lungs/Pleura: Bilateral layering pleural effusions. Mild bibasilar passive atelectasis. No suspicious pulmonary nodules. Upper Abdomen: Limited view of the liver, kidneys, pancreas are unremarkable. Normal adrenal glands. Musculoskeletal: No aggressive osseous lesion. IMPRESSION: 1. No evidence of thoracic metastasis. 2. Bilateral small layering pleural effusions with passive atelectasis Electronically Signed   By: Suzy Bouchard M.D.   On: 08/15/2021 12:40   CT ABDOMEN PELVIS W CONTRAST  Result Date: 08/14/2021 CLINICAL DATA:  Postop post Hartmann's for perforated colon cancer, question abscess. Abdominal pain. EXAM: CT ABDOMEN AND PELVIS WITH CONTRAST TECHNIQUE: Multidetector CT imaging of  the abdomen and pelvis was performed using the standard protocol following bolus administration of intravenous contrast. CONTRAST:  44mL OMNIPAQUE IOHEXOL 350 MG/ML SOLN COMPARISON:  Preoperative CT 08/06/2021 FINDINGS: Lower chest: Small bilateral pleural effusions and compressive atelectasis. Heart is normal in size. Dilated fluid-filled distal  esophagus with enteric tube in place. Hepatobiliary: Tiny subcentimeter hypodensity in the right hepatic lobe, series 3, image 11. No other focal liver abnormality. Wall calcification of the gallbladder fundus, gallbladder partially distended. No pericholecystic inflammation or visualized gallstones. No biliary dilatation. Pancreas: No ductal dilatation or inflammation. Spleen: Normal in size without focal abnormality. Adrenals/Urinary Tract: No adrenal nodule. No hydronephrosis or perinephric edema. Homogeneous renal enhancement with symmetric excretion on delayed phase imaging. Left renal cyst as well as low-density lesion in the right kidney that is too small to characterize. Urinary bladder is physiologically distended without wall thickening. Focus of air in the nondependent bladder is likely related to recent Foley catheter placement. Stomach/Bowel: Enteric tube tip in the stomach. The stomach is dilated with air, contrast mixed with enteric contents. There is no gastric wall thickening. Mildly dilated contrast filled small bowel with no bowel diameter measuring up to 4.1 cm. Administered contrast reaches the mid small bowel. More distal small bowel are mildly dilated and fluid-filled. Short segment of small bowel wall thickening and inflammation in the pelvis involving the distal ileum, series 3, image 64. No discrete transition point. Stapled off sigmoid colon in the pelvis. Fluid/liquid stool in the cecum and ascending colon. There is formed stool in the hepatic flexure, transverse, and proximal descending colon. Nondistention versus wall thickening of the distal descending colon, with left lower quadrant colostomy. No peristomal hernia. Vascular/Lymphatic: Mild aortic atherosclerosis. Heterogeneous partially enhancing 14 mm noted in the retroperitoneum at the aortoiliac bifurcation, series 3, image 50. This is not significantly changed from prior exam. Suspected additional lymph nodes in the anterior  common iliac space, series 3, image 54. There is scattered occasional mesenteric lymph nodes are not enlarged by size criteria. Reproductive: Prostate is unremarkable. Other: Small amount of non organized free fluid in the pelvis without peripheral enhancement. Mild generalized stranding in the pelvis adjacent to surgical sutures. Surgical drain entering in the right abdomen courses adjacent to small bowel, loops into the pelvis with tip terminating in the central mid pelvis. There is no fluid collection or fluid along the course of the drain. Two small residual foci of air within the pelvic mesentery with mild adjacent thickening, series 3 images 57 and 62, but no drainable collection. Single bubble of gas persists under the left lower quadrant ostomy. Post laparotomy. Open abdominal wound. No subcutaneous collection. Musculoskeletal: No focal bone lesion or acute osseous abnormality. IMPRESSION: 1. Post recent sigmoid colectomy with left lower quadrant colostomy. Two small residual foci of air within the pelvic mesentery with mild adjacent thickening, but no abscess or drainable collection. Trace non organized free fluid and stranding in the pelvis. 2. Short segment of small bowel wall thickening and inflammation in the pelvis involving the distal ileum, likely reactive. 3. Dilated distal esophagus, stomach, and small bowel, without discrete transition point, favoring postoperative ileus. 4. Small bilateral pleural effusions and compressive atelectasis. 5. Heterogeneous partially enhancing 14 mm lymph node in the retroperitoneum at the aortoiliac bifurcation, not significantly changed from prior exam. Suspected additional lymph nodes in the anterior common iliac space, not significantly changed from prior exam. Recommend attention at follow-up. 6. Additional chronic findings as described. Aortic Atherosclerosis (ICD10-I70.0). Electronically Signed   By:  Keith Rake M.D.   On: 08/14/2021 19:56   DG Abd  Portable 1V  Result Date: 08/14/2021 CLINICAL DATA:  NG tube placement EXAM: PORTABLE ABDOMEN - 1 VIEW COMPARISON:  Radiographs dated August 14, 2021 at 9:38 a.m. FINDINGS: Multiple dilated small bowel loops concerning for ileus or obstruction. NG tube with distal tip about the gastric pylorus. No radio-opaque calculi or other significant radiographic abnormality are seen. IMPRESSION: Satisfactory positioning of the NG tube. Electronically Signed   By: Keane Police D.O.   On: 08/14/2021 15:19   DG Abd Portable 1V  Result Date: 08/14/2021 CLINICAL DATA:  Follow-up probable ileus. Status post laparotomy and partial colectomy on 08/08/2021 EXAM: PORTABLE ABDOMEN - 1 VIEW COMPARISON:  08/13/2021 FINDINGS: Nasogastric tube tip in the distal stomach in the region of the pylorus. Increase in caliber of multiple dilated, gas-filled small bowel loops. Normal caliber distal small bowel in the pelvis. Left lower quadrant ostomy. Stable drainage tube overlying the pelvis and inferior abdomen. Unremarkable bones. IMPRESSION: Progressive small bowel dilatation, concerning for partial obstruction. Electronically Signed   By: Claudie Revering M.D.   On: 08/14/2021 10:27    Anti-infectives: Anti-infectives (From admission, onward)    Start     Dose/Rate Route Frequency Ordered Stop   08/11/21 1230  Ampicillin-Sulbactam (UNASYN) 3 g in sodium chloride 0.9 % 100 mL IVPB        3 g 200 mL/hr over 30 Minutes Intravenous Every 6 hours 08/11/21 1135 08/17/21 1559   08/09/21 1600  ceFAZolin (ANCEF) IVPB 2g/100 mL premix  Status:  Discontinued        2 g 200 mL/hr over 30 Minutes Intravenous Every 8 hours 08/09/21 1501 08/11/21 1135   08/07/21 0600  piperacillin-tazobactam (ZOSYN) IVPB 3.375 g  Status:  Discontinued        3.375 g 12.5 mL/hr over 240 Minutes Intravenous Every 8 hours 08/07/21 0544 08/09/21 1501   08/06/21 2100  piperacillin-tazobactam (ZOSYN) IVPB 3.375 g        3.375 g 12.5 mL/hr over 240 Minutes  Intravenous  Once 08/06/21 2057 08/07/21 0900        Assessment/Plan POD 8, 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 Burr Medico for outpatient follow up -CT chest 1/13 - no evidence of thoracic metastasis -Sips of liquids from floor (ice chips etc) ok today. If nausea/vomiting/distention, NPO, replace NG -mobilization, only HH OT recommended, no PT follow up -[facility] RN orders placed to see if we can get this arranged for him. -cont abx therapy for E. Coli bacteremia, sensitive to ancef -cont BID dressing changes -WOC help appreciated for new colostomy, mother will need a lot of teaching prior to discharge -WBC down trending, AF -IS/pulm toilet -multi-modal pain control -CEA  normal  FEN - NPO x iceIVFs/NGT/PICC/TNA VTE - Lovenox ID - ancef, stop date in place (bacteremia)  Schizophrenia - medications have been ordered   LOS: 9 days    Ileana Roup , [facility] 08/16/2021, 8:23 AM Please see Amion for pager number during day hours 7:00am-4:30pm or 7:00am -11:30am on weekends

## 2021-08-16 NOTE — Progress Notes (Signed)
Patient ID: Matthew [name], male   DOB: [birth year], 56 y.o.   MRN: [id number] I was notified by his nurse that his mother had a lot of questions. I spoke with her at the bedside for a clinical update. She also called her niece on speaker phone to participate in the conversation. I discussed the plan of care and answered their questions.  Georganna Skeans, MD, MPH, FACS Please use [email or web address] to contact on call provider

## 2021-08-17 LAB — GLUCOSE, CAPILLARY
Glucose-Capillary: 136 mg/dL — ABNORMAL HIGH (ref 70–99)
Glucose-Capillary: 144 mg/dL — ABNORMAL HIGH (ref 70–99)
Glucose-Capillary: 155 mg/dL — ABNORMAL HIGH (ref 70–99)
Glucose-Capillary: 160 mg/dL — ABNORMAL HIGH (ref 70–99)

## 2021-08-17 MED ORDER — TRAVASOL 10 % IV SOLN
INTRAVENOUS | Status: AC
  Filled 2021-08-17: qty 1040.4

## 2021-08-17 NOTE — Progress Notes (Signed)
Pt ate first meal today without assist. Pt ate 100% of meal. Dysphagia 3 pot roast, mashed potatoes/gravy, green beans, and diced peaches. Drank 120 ml tea. Prior to this pt had nothing by mouth. No meds, ice chips or sips. Payton Emerald, RN

## 2021-08-17 NOTE — Progress Notes (Signed)
Mobility Specialist Progress Note    08/17/21 1002  Mobility  Activity Ambulated in hall  Level of Assistance Contact guard assist, steadying assist  Assistive Device None  Distance Ambulated (ft) 480 ft  Mobility Ambulated independently in hallway  Mobility Response Tolerated fair  Mobility performed by Mobility specialist  Bed Position Chair  $Mobility charge 1 Mobility   Pre-Mobility: 87 HR, 146/72 BP During Mobility: 116 HR Post-Mobility: 87 HR  Pt received in bed and agreeable. No complaints. Returned to chair with call bell in reach.   [facility] Mobility Specialist  M.S. 2C and 6E: [phone] M.S. 4E: [phone]

## 2021-08-17 NOTE — Progress Notes (Signed)
9 Days Post-Op  Subjective: No complaints today.  Denies nausea, vomiting or significant distention. Colostomy has began working - stool and gas. Specifically denies abdominal pain, nausea, bloating.   Objective: Vital signs in last 24 hours: Temp:  [97.6 F (36.4 C)-98.7 F (37.1 C)] 97.6 F (36.4 C) (01/15 0747) Pulse Rate:  [80-94] 88 (01/15 0747) Resp:  [16-20] 18 (01/15 0747) BP: (96-110)/(61-72) 99/71 (01/15 0747) SpO2:  [99 %-100 %] 100 % (01/15 0747) Last BM Date: 08/16/21  Intake/Output from previous day: 01/14 0701 - 01/15 0700 In: -  Out: 1690 [Urine:1675; Drains:5; Stool:10] Intake/Output this shift: No intake/output data recorded.  PE: Heart: regular Lungs: CTAB Abd: soft, midline wound is clean. Not significantly distended. Colostomy with pink and viable stoma, thin stool in appliance.   Lab Results:  Recent Labs    08/15/21 0410 08/16/21 0420  WBC 12.7* 12.5*  HGB 9.4* 9.1*  HCT 29.1* 28.0*  PLT 628* 647*   BMET Recent Labs    08/15/21 0410 08/16/21 0420  NA 138 137  K 4.3 4.2  CL 109 105  CO2 23 23  GLUCOSE 125* 119*  BUN 10 11  CREATININE 0.67 0.68  CALCIUM 7.9* 8.2*   PT/INR No results for input(s): LABPROT, INR in the last 72 hours. CMP     Component Value Date/Time   NA 137 08/16/2021 0420   K 4.2 08/16/2021 0420   CL 105 08/16/2021 0420   CO2 23 08/16/2021 0420   GLUCOSE 119 (H) 08/16/2021 0420   BUN 11 08/16/2021 0420   CREATININE 0.68 08/16/2021 0420   CALCIUM 8.2 (L) 08/16/2021 0420   PROT 5.3 (L) 08/14/2021 0405   ALBUMIN 2.0 (L) 08/14/2021 0405   AST 45 (H) 08/14/2021 0405   ALT 41 08/14/2021 0405   ALKPHOS 59 08/14/2021 0405   BILITOT 0.6 08/14/2021 0405   GFRNONAA >60 08/16/2021 0420   GFRAA >60 04/01/2020 1237   Lipase     Component Value Date/Time   LIPASE 22 08/06/2021 1922       Studies/Results: CT CHEST W CONTRAST  Result Date: 08/15/2021 CLINICAL DATA:  Colon cancer, metastatic staging EXAM:  CT CHEST WITH CONTRAST TECHNIQUE: Multidetector CT imaging of the chest was performed during intravenous contrast administration. RADIATION DOSE REDUCTION: This exam was performed according to the departmental dose-optimization program which includes automated exposure control, adjustment of the mA and/or kV according to patient size and/or use of iterative reconstruction technique. CONTRAST:  14mL OMNIPAQUE IOHEXOL 300 MG/ML  SOLN COMPARISON:  CT abdomen pelvis 08/14/2021 FINDINGS: Cardiovascular: No significant vascular findings. Normal heart size. No pericardial effusion. PICC line extends into the distal SVC Mediastinum/Nodes: NG tube esophagus. Trachea normal. No mediastinal adenopathy. No axillary or supraclavicular adenopathy. Lungs/Pleura: Bilateral layering pleural effusions. Mild bibasilar passive atelectasis. No suspicious pulmonary nodules. Upper Abdomen: Limited view of the liver, kidneys, pancreas are unremarkable. Normal adrenal glands. Musculoskeletal: No aggressive osseous lesion. IMPRESSION: 1. No evidence of thoracic metastasis. 2. Bilateral small layering pleural effusions with passive atelectasis Electronically Signed   By: Suzy Bouchard M.D.   On: 08/15/2021 12:40    Anti-infectives: Anti-infectives (From admission, onward)    Start     Dose/Rate Route Frequency Ordered Stop   08/11/21 1230  Ampicillin-Sulbactam (UNASYN) 3 g in sodium chloride 0.9 % 100 mL IVPB        3 g 200 mL/hr over 30 Minutes Intravenous Every 6 hours 08/11/21 1135 08/17/21 1559   08/09/21 1600  ceFAZolin (  ANCEF) IVPB 2g/100 mL premix  Status:  Discontinued        2 g 200 mL/hr over 30 Minutes Intravenous Every 8 hours 08/09/21 1501 08/11/21 1135   08/07/21 0600  piperacillin-tazobactam (ZOSYN) IVPB 3.375 g  Status:  Discontinued        3.375 g 12.5 mL/hr over 240 Minutes Intravenous Every 8 hours 08/07/21 0544 08/09/21 1501   08/06/21 2100  piperacillin-tazobactam (ZOSYN) IVPB 3.375 g        3.375  g 12.5 mL/hr over 240 Minutes Intravenous  Once 08/06/21 2057 08/07/21 0900        Assessment/Plan POD 9, 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 Burr Medico for outpatient follow up -CT chest 1/13 - no evidence of thoracic metastasis -No n/v over last 24 hrs. Abdomen is quite soft and stoma working. Refusing to drink liquids, states he is hungry and only wants food. Will give soft diet and see how things go. -mobilization, only HH OT recommended, no PT follow up -[facility] RN orders placed to see if we can get this arranged for him. -cont abx therapy for E. Coli bacteremia, sensitive to ancef -cont BID dressing changes -WOC help appreciated for new colostomy, mother will need a lot of teaching prior to discharge -WBC down trending, AF -IS/pulm toilet -multi-modal pain control -CEA  normal  FEN - PICC/TPN, start soft diet VTE - Lovenox ID - ancef, stop date in place (bacteremia)  Schizophrenia - medications have been ordered   LOS: 10 days   Nadeen Landau, MD [facility], [place]

## 2021-08-17 NOTE — Progress Notes (Signed)
PHARMACY - TOTAL PARENTERAL NUTRITION CONSULT NOTE  Indication: Prolonged ileus  Patient Measurements: Height: 5\' 9"  (175.3 cm) Weight: 60.4 kg (133 lb 2.5 oz) IBW/kg (Calculated) : 70.7 TPN AdjBW (KG): 60.4 Body mass index is 19.66 kg/m.  Assessment:  42 yom with schizophrenia admitted 1/4 with lower abdominal pain and N/V for 1 week, found to have perforated diverticulitis with abscess on CT not amenable to perc drain. He is also being treated for E coli bacteremia. He had an ex-lap, sigmoid colectomy with end colostomy on 1/6. Of note, patient is a limited historian however mother reported weight loss of 30-40 lb in last year. Patient has been NPO for the last 6 days and noted to have moderate malnutrition per RD. He is also at risk for refeeding syndrome. Pharmacy consulted to manage TPN.  Glucose / Insulin: no hx DM, CBGs controlled < 180. Used 4 units SSI in last 24hrs Electrolytes: all WNL with downtrending Na on 1/14 (Unasyn ending 1/15) Renal: AKI resolved - SCr down to 0.6s, BUN WNL Hepatic: LFTs stable, TG / tbili WNL, albumin 2 Intake / Output; MIVF: UOP 1.2 ml/kg/hr, drain down to 31mL, NGT removed, colostomy 82mL (liquid stool and gas), emesis x1 on 1/12;  LR at 40 ml/hr GI Imaging: -1/5 CT: sigmoid colonic perf with abscess; 2 separate points of perf, possible underlying malignancy. 7.1 cm gas and fluid containing pericolonic abscess within the sigmoid mesentery. Marked inflammatory change of terminal ileum adjacent to pericolonic abscess with resultant small bowel obstruction  -1/11 Abd XR: unchanged ileus -1/12 CT: ileus, no drainable abscess or collection GI Surgeries / Procedures:  1/6: ex-lap, sigmoid colectomy with end colostomy  Central access: double lumen PICC 08/12/21 TPN start date: 08/12/21  Nutritional Goals: RD Estimated Needs Total Energy Estimated Needs: 2000-2200 Total Protein Estimated Needs: 100-110g Total Fluid Estimated Needs: >/=2.0L  Current  Nutrition:  TPN  Plan:  Continue TPN at goal rate 85 mL/hr to provide 104g AA, 57g ILE, 326g CHO and 2098 kCal, meeting 100% of needs Electrolytes in TPN: increase Na to 47mEq/L on 1/14, K 45mEq/L, Ca 59mEq/L, Mag 82mEq/L, Phos 48mmol/L. Cl:Ac 1:1 - no change today 1/15 Add standard MVI and trace elements to TPN Continue sensitive SSI Q6H LR at 40 mL/hr per MD Standard TPN labs Mon/Thurs  Jahziah Simonin D. Mina Marble, PharmD, BCPS, [place] 08/17/2021, 7:21 AM

## 2021-08-18 LAB — GLUCOSE, CAPILLARY
Glucose-Capillary: 117 mg/dL — ABNORMAL HIGH (ref 70–99)
Glucose-Capillary: 119 mg/dL — ABNORMAL HIGH (ref 70–99)
Glucose-Capillary: 135 mg/dL — ABNORMAL HIGH (ref 70–99)
Glucose-Capillary: 139 mg/dL — ABNORMAL HIGH (ref 70–99)
Glucose-Capillary: 155 mg/dL — ABNORMAL HIGH (ref 70–99)

## 2021-08-18 LAB — COMPREHENSIVE METABOLIC PANEL
ALT: 49 U/L — ABNORMAL HIGH (ref 0–44)
AST: 30 U/L (ref 15–41)
Albumin: 2.4 g/dL — ABNORMAL LOW (ref 3.5–5.0)
Alkaline Phosphatase: 109 U/L (ref 38–126)
Anion gap: 7 (ref 5–15)
BUN: 12 mg/dL (ref 6–20)
CO2: 23 mmol/L (ref 22–32)
Calcium: 8.6 mg/dL — ABNORMAL LOW (ref 8.9–10.3)
Chloride: 106 mmol/L (ref 98–111)
Creatinine, Ser: 0.75 mg/dL (ref 0.61–1.24)
GFR, Estimated: >60 mL/min (ref >60)
Glucose, Bld: 135 mg/dL — ABNORMAL HIGH (ref 70–99)
Potassium: 4.2 mmol/L (ref 3.5–5.1)
Sodium: 136 mmol/L (ref 135–145)
Total Bilirubin: 0.5 mg/dL (ref 0.3–1.2)
Total Protein: 6.2 g/dL — ABNORMAL LOW (ref 6.5–8.1)

## 2021-08-18 LAB — TRIGLYCERIDES: Triglycerides: 68 mg/dL (ref <150)

## 2021-08-18 LAB — MAGNESIUM: Magnesium: 2.2 mg/dL (ref 1.7–2.4)

## 2021-08-18 LAB — PHOSPHORUS: Phosphorus: 3.9 mg/dL (ref 2.5–4.6)

## 2021-08-18 MED ORDER — TRAVASOL 10 % IV SOLN
INTRAVENOUS | Status: DC
  Filled 2021-08-18: qty 1040.4

## 2021-08-18 MED ORDER — TRAVASOL 10 % IV SOLN
INTRAVENOUS | Status: AC
  Filled 2021-08-18: qty 550.8

## 2021-08-18 NOTE — Progress Notes (Signed)
Physical Therapy Treatment Patient Details Name: Matthew [name] MRN: [id number] DOB: [birth year] Today's Date: 08/18/2021   History of Present Illness 56 y.o. male presenting to ED 1/4 with lower abdominal pain and N/V x1 week. Work-up (+) 2 perforations of sigmoid colon and SBO secondary to 7.1cm abscess. S/p ex lap 1/6 with partial colectomy and end colostomy. PMHx significant for schizophrenia.    PT Comments    Pt showed ability to mobilize at an Independent level without AD in a homelike environment.  Emphasis on transitions, sit to stand, progression of gait stability, quality, speed and stamina plus safe negotiation of stairs.    Recommendations for follow up therapy are one component of a multi-disciplinary discharge planning process, led by the attending physician.  Recommendations may be updated based on patient status, additional functional criteria and insurance authorization.  Follow Up Recommendations  No PT follow up     Assistance Recommended at Discharge PRN  Patient can return home with the following Assistance with cooking/housework;A lot of help with bathing/dressing/bathroom   Equipment Recommendations  None recommended by PT    Recommendations for Other Services       Precautions / Restrictions Precautions Precautions: Fall (minimal) Precaution Comments: JP drain to R abdomen; abdominal incision w/ precautions; colostomy     Mobility  Bed Mobility Overal bed mobility: Independent             General bed mobility comments: increased time but able to complete without assist.    Transfers Overall transfer level: Independent Equipment used: None               General transfer comment: movement steady and with ease    Ambulation/Gait Ambulation/Gait assistance: Supervision (likely independent in a homelike environment) Gait Distance (Feet): 400 Feet Assistive device: None Gait Pattern/deviations: Step-through pattern   Gait velocity  interpretation: >2.62 ft/sec, indicative of community ambulatory   General Gait Details: pt is steady with moderate step length and speed, noticeable changes, but no ability to give bursts of speed.  Pt able to stay steady with moderate challenge to balance.   Stairs Stairs: Yes Stairs assistance: Supervision Stair Management: One rail Right;Alternating pattern;Forwards Number of Stairs: 4 General stair comments: safe with the rail   Wheelchair Mobility    Modified Rankin (Stroke Patients Only)       Balance Overall balance assessment: Needs assistance Sitting-balance support: Feet supported;No upper extremity supported Sitting balance-Leahy Scale: Good     Standing balance support: During functional activity;No upper extremity supported Standing balance-Leahy Scale: Good                              Cognition Arousal/Alertness: Awake/alert Behavior During Therapy: Flat affect Overall Cognitive Status: No family/caregiver present to determine baseline cognitive functioning                                          Exercises      General Comments General comments (skin integrity, edema, etc.): vss on RA, HR in the 90's/100's with activity      Pertinent Vitals/Pain Pain Assessment: No/denies pain Faces Pain Scale: No hurt Pain Intervention(s): Monitored during session    Home Living                          Prior  Function            PT Goals (current goals can now be found in the care plan section) Acute Rehab PT Goals Time For Goal Achievement: 08/25/21 Potential to Achieve Goals: Good Progress towards PT goals: Goals met/education completed, patient discharged from PT    Frequency    Min 2X/week      PT Plan Current plan remains appropriate    Co-evaluation              AM-PAC PT "6 Clicks" Mobility   Outcome Measure  Help needed turning from your back to your side while in a flat bed without  using bedrails?: None Help needed moving from lying on your back to sitting on the side of a flat bed without using bedrails?: None Help needed moving to and from a bed to a chair (including a wheelchair)?: None Help needed standing up from a chair using your arms (e.g., wheelchair or bedside chair)?: None Help needed to walk in hospital room?: A Little Help needed climbing 3-5 steps with a railing? : A Little 6 Click Score: 22    End of Session   Activity Tolerance: Patient tolerated treatment well Patient left: with call bell/phone within reach;in bed Nurse Communication: Mobility status PT Visit Diagnosis: Other abnormalities of gait and mobility (R26.89)     Time: 8347-5830 PT Time Calculation (min) (ACUTE ONLY): 17 min  Charges:  $Gait Training: 8-22 mins                     08/18/2021  Ginger Carne., PT Acute Rehabilitation Services [phone]  (pager) [phone]  (office)   Tessie Fass Savir Blanke 08/18/2021, 1:15 PM

## 2021-08-18 NOTE — Progress Notes (Addendum)
PHARMACY - TOTAL PARENTERAL NUTRITION CONSULT NOTE  Indication: Prolonged ileus  Patient Measurements: Height: 5\' 9"  (175.3 cm) Weight: 60.4 kg (133 lb 2.5 oz) IBW/kg (Calculated) : 70.7 TPN AdjBW (KG): 60.4 Body mass index is 19.66 kg/m.  Assessment:  70 yom with schizophrenia admitted 1/4 with lower abdominal pain and N/V for 1 week, found to have perforated diverticulitis with abscess on CT not amenable to perc drain. He is also being treated for E coli bacteremia. He had an ex-lap, sigmoid colectomy with end colostomy on 1/6. Of note, patient is a limited historian however mother reported weight loss of 30-40 lb in last year. Patient has been NPO for the last 6 days and noted to have moderate malnutrition per RD. He is also at risk for refeeding syndrome. Pharmacy consulted to manage TPN.  Glucose / Insulin: no hx DM, CBGs controlled < 180. Used 4 units SSI in last 24hrs Electrolytes: all WNL Renal: AKI resolved - SCr < 1, BUN WNL Hepatic: LFTs stable, TG / tbili WNL, albumin 2.4 Intake / Output; MIVF: UOP 1.5 ml/kg/hr, drain down to 32mL, NGT removed, colostomy 52mL (liquid stool and gas), LR at 40 ml/hr GI Imaging: -1/5 CT: sigmoid colonic perf with abscess; 2 separate points of perf, possible underlying malignancy. 7.1 cm gas and fluid containing pericolonic abscess within the sigmoid mesentery. Marked inflammatory change of terminal ileum adjacent to pericolonic abscess with resultant small bowel obstruction  -1/11 Abd XR: unchanged ileus -1/12 CT: ileus, no drainable abscess or collection GI Surgeries / Procedures:  1/6: ex-lap, sigmoid colectomy with end colostomy  Central access: double lumen PICC 08/12/21 TPN start date: 08/12/21  Nutritional Goals: RD Estimated Needs Total Energy Estimated Needs: 2000-2200 Total Protein Estimated Needs: 100-110g Total Fluid Estimated Needs: >/=2.0L  Current Nutrition:  TPN Dysphagia diet - consume 100% of one meal on 1/15, then refuses  meal trays  Plan:  Reduce TPN to half per Surgery - TPN at 45 ml/hr (goal rate 85 ml/hr) Electrolytes in TPN: Na 187mEq/L, K to 74mEq/L, Ca 64mEq/L, Mag to 80mEq/L, Phos to 23 mmol/L. Cl:Ac 1:1 - lytes adjusted with reduced TPN rate Add standard MVI and trace elements to TPN Continue sensitive SSI Q6H LR at 40 mL/hr per MD Standard TPN labs Mon/Thurs.  BMET in AM since K in TPN is reduced. F/u PO intake.  If patient remains unmotivated to eat for a prolonged period of time, recommend supplementing with tube feeding.  Elgar Scoggins D. Mina Marble, PharmD, BCPS, [place] 08/18/2021, 11:10 AM

## 2021-08-18 NOTE — Progress Notes (Addendum)
10 Days Post-Op  Subjective: Ate a full soft meal yesterday as charted. States he was nausea afterwards and has not felt like eating anything since (did not eat dinner or breakfast). Still drinking water without issues. No nausea this am. Abdominal pain is stable  Objective: Vital signs in last 24 hours: Temp:  [98.1 F (36.7 C)-99.7 F (37.6 C)] 98.1 F (36.7 C) (01/16 0440) Pulse Rate:  [88-104] 95 (01/16 0440) Resp:  [15-20] 20 (01/16 0007) BP: (90-119)/(59-86) 116/86 (01/16 0440) SpO2:  [98 %-100 %] 99 % (01/16 0440) Last BM Date: 08/16/21  Intake/Output from previous day: 01/15 0701 - 01/16 0700 In: 210 [P.O.:120; I.V.:90] Out: 2128 [Urine:2125; Drains:3] Intake/Output this shift: No intake/output data recorded.  PE: Heart: regular Lungs: CTAB Abd: soft, midline wound is clean with beefy granulation tissue. No distension. Colostomy with pink and viable stoma, no gas or stool this am  Lab Results:  Recent Labs    08/16/21 0420  WBC 12.5*  HGB 9.1*  HCT 28.0*  PLT 647*    BMET Recent Labs    08/16/21 0420 08/18/21 0455  NA 137 136  K 4.2 4.2  CL 105 106  CO2 23 23  GLUCOSE 119* 135*  BUN 11 12  CREATININE 0.68 0.75  CALCIUM 8.2* 8.6*    PT/INR No results for input(s): LABPROT, INR in the last 72 hours. CMP     Component Value Date/Time   NA 136 08/18/2021 0455   K 4.2 08/18/2021 0455   CL 106 08/18/2021 0455   CO2 23 08/18/2021 0455   GLUCOSE 135 (H) 08/18/2021 0455   BUN 12 08/18/2021 0455   CREATININE 0.75 08/18/2021 0455   CALCIUM 8.6 (L) 08/18/2021 0455   PROT 6.2 (L) 08/18/2021 0455   ALBUMIN 2.4 (L) 08/18/2021 0455   AST 30 08/18/2021 0455   ALT 49 (H) 08/18/2021 0455   ALKPHOS 109 08/18/2021 0455   BILITOT 0.5 08/18/2021 0455   GFRNONAA >60 08/18/2021 0455   GFRAA >60 04/01/2020 1237   Lipase     Component Value Date/Time   LIPASE 22 08/06/2021 1922       Studies/Results: No results  found.  Anti-infectives: Anti-infectives (From admission, onward)    Start     Dose/Rate Route Frequency Ordered Stop   08/11/21 1230  Ampicillin-Sulbactam (UNASYN) 3 g in sodium chloride 0.9 % 100 mL IVPB        3 g 200 mL/hr over 30 Minutes Intravenous Every 6 hours 08/11/21 1135 08/17/21 1127   08/09/21 1600  ceFAZolin (ANCEF) IVPB 2g/100 mL premix  Status:  Discontinued        2 g 200 mL/hr over 30 Minutes Intravenous Every 8 hours 08/09/21 1501 08/11/21 1135   08/07/21 0600  piperacillin-tazobactam (ZOSYN) IVPB 3.375 g  Status:  Discontinued        3.375 g 12.5 mL/hr over 240 Minutes Intravenous Every 8 hours 08/07/21 0544 08/09/21 1501   08/06/21 2100  piperacillin-tazobactam (ZOSYN) IVPB 3.375 g        3.375 g 12.5 mL/hr over 240 Minutes Intravenous  Once 08/06/21 2057 08/07/21 0900        Assessment/Plan POD 10, 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 Burr Medico for outpatient follow up -CT chest 1/13 - no evidence of thoracic metastasis - some nausea after meal yesterday. Continue soft diet  today but encouraged him to go slow with intake and amount. Will wean TPN -HH RN orders placed to see if we can get this arranged for him. -abx therapy for E. Coli bacteremia, sensitive to ancef/unasyn completed -cont BID dressing changes -WOC help appreciated for new colostomy, mother will need a lot of teaching prior to discharge -WBC down trending at 12.5 1/14, AF -IS/pulm toilet -multi-modal pain control -CEA  normal - JP drain SS with 3 ml output  - mobilize. OT recommending [place] OT. PT no follow up. Continue therapies  FEN - wean PICC/TPN, continue soft diet VTE - Lovenox ID - zosyn, ancef, unasyn 1/9>>1/15 therapy for bacteremia completed  Schizophrenia - medications have been ordered   LOS: 11 days   Matthew [name], [facility] 08/18/2021, 7:59 AM Please see Amion for pager number during day hours 7:00am-4:30pm

## 2021-08-18 NOTE — Progress Notes (Signed)
Occupational Therapy Treatment Patient Details Name: Matthew [name] MRN: [id number] DOB: 1965-11-10 Today's Date: 08/18/2021   History of present illness 56 y.o. male presenting to ED 1/4 with lower abdominal pain and N/V x1 week. Work-up (+) 2 perforations of sigmoid colon and SBO secondary to 7.1cm abscess. S/p ex lap 1/6 with partial colectomy and end colostomy. PMHx significant for schizophrenia.   OT comments  OT treatment session with focus on self-care re-education, ADL transfers and short distance functional mobility (patient politely declined longer distance mobility). Patient currently completing sit to stand transfers with I and ADLs with including bathing/dressing at sink level with Min question cues and supervision A overall. Continued limited verbalizations but follows all verbal commands with good accuracy. D/c recommendation updated to no follow-up given patient progress. OT will continue to follow acutely.    Recommendations for follow up therapy are one component of a multi-disciplinary discharge planning process, led by the attending physician.  Recommendations may be updated based on patient status, additional functional criteria and insurance authorization.    Follow Up Recommendations  No OT follow up    Assistance Recommended at Discharge Frequent or constant Supervision/Assistance  Patient can return home with the following  A little help with bathing/dressing/bathroom;Assistance with cooking/housework   Equipment Recommendations  None recommended by OT    Recommendations for Other Services      Precautions / Restrictions Precautions Precautions: Fall Precaution Comments: JP drain to R abdomen; abdominal incision w/ precautions; colostomy Restrictions Weight Bearing Restrictions: No       Mobility Bed Mobility Overal bed mobility: Independent                  Transfers Overall transfer level: Independent                       Balance  Overall balance assessment: Mild deficits observed, not formally tested                                         ADL either performed or assessed with clinical judgement   ADL Overall ADL's : Needs assistance/impaired Eating/Feeding: Independent       Upper Body Bathing: Set up;Sitting   Lower Body Bathing: Set up;Sit to/from stand   Upper Body Dressing : Set up;Sitting   Lower Body Dressing: Set up;Sit to/from stand   Toilet Transfer: Copy Details (indicate cue type and reason): Simulated with transfer to recliner.                Extremity/Trunk Assessment              Vision       Perception     Praxis      Cognition Arousal/Alertness: Awake/alert Behavior During Therapy: Flat affect Overall Cognitive Status: No family/caregiver present to determine baseline cognitive functioning                                            Exercises     Shoulder Instructions       General Comments VSS on RA.    Pertinent Vitals/ Pain       Pain Assessment: No/denies pain Pain Intervention(s): Monitored during session  Home Living  Prior Functioning/Environment              Frequency  Min 2X/week        Progress Toward Goals  OT Goals(current goals can now be found in the care plan section)  Progress towards OT goals: Progressing toward goals  Acute Rehab OT Goals Patient Stated Goal: No goals stated. OT Goal Formulation: With patient Time For Goal Achievement: 08/25/21 Potential to Achieve Goals: Fair ADL Goals Pt Will Perform Grooming: with modified independence;standing Pt Will Perform Upper Body Dressing: with modified independence Pt Will Perform Lower Body Dressing: with modified independence;sit to/from stand Pt Will Transfer to Toilet: with modified independence;ambulating;regular height toilet Pt Will Perform  Toileting - Clothing Manipulation and hygiene: with modified independence;sit to/from stand Pt Will Perform Tub/Shower Transfer: Tub transfer;with modified independence Additional ADL Goal #1: Patient will maintain dymaic standing balance with Mod I in prep for ADLs.  Plan Discharge plan needs to be updated;Frequency remains appropriate    Co-evaluation                 AM-PAC OT "6 Clicks" Daily Activity     Outcome Measure   Help from another person eating meals?: None Help from another person taking care of personal grooming?: A Little Help from another person toileting, which includes using toliet, bedpan, or urinal?: A Little Help from another person bathing (including washing, rinsing, drying)?: A Little Help from another person to put on and taking off regular upper body clothing?: A Little Help from another person to put on and taking off regular lower body clothing?: A Little 6 Click Score: 19    End of Session Equipment Utilized During Treatment: Gait belt  OT Visit Diagnosis: Unsteadiness on feet (R26.81);Muscle weakness (generalized) (M62.81)   Activity Tolerance Patient tolerated treatment well   Patient Left in chair;with call bell/phone within reach;with chair alarm set   Nurse Communication Mobility status        Time: 7494-4967 OT Time Calculation (min): 18 min  Charges: OT General Charges $OT Visit: 1 Visit OT Treatments $Self Care/Home Management : 8-22 mins  Rokhaya Quinn H. OTR/L Supplemental OT, Department of rehab services [phone]  Jaina Morin R H. 08/18/2021, 10:04 AM

## 2021-08-18 NOTE — Plan of Care (Signed)
  Problem: Clinical Measurements: Goal: Will remain free from infection Outcome: Progressing Goal: Cardiovascular complication will be avoided Outcome: Progressing   Problem: Activity: Goal: Risk for activity intolerance will decrease Outcome: Progressing   Problem: Nutrition: Goal: Adequate nutrition will be maintained Outcome: Progressing   

## 2021-08-18 NOTE — TOC Initial Note (Signed)
Transition of Care (TOC) - Initial/Assessment Note  Marvetta Gibbons RN, BSN Transitions of Care Unit 4E- RN Case Manager See Treatment Team for direct phone #    Patient Details  Name: Matthew [name] MRN: [id number] Date of Birth: Dec 13, 1965  Transition of Care [facility]) CM/SW Contact:    Dawayne Patricia, RN Phone Number: 08/18/2021, 2:51 PM  Clinical Narrative:                 CM following for [facility] needs for new colostomy. Noted pt still on TPN, will address [place] when pt closer to being medically stable for discharge. Have asked bedside staff to provide as much education to mom as possible- as HH will be limited. Will need to have [place] involved as well for bedside teaching with mom.   Have spoken with Hoyle Sauer at [facility] regarding [facility] needs and referral- Hoyle Sauer will follow for possible acceptance, to address again closer to discharge, if needed will reach out to other [facility] agencies if [facility] unable to accept.   Expected Discharge Plan: [place] Barriers to Discharge: Continued Medical Work up   Patient Goals and CMS Choice        Expected Discharge Plan and Services Expected Discharge Plan: [place]   Discharge Planning Services: CM Consult Post Acute Care Choice: [place] arrangements for the past 2 months: Single Family Home                           HH Arranged: RN          Prior Living Arrangements/Services Living arrangements for the past 2 months: Single Family Home Lives with:: Self, Parents Patient language and need for interpreter reviewed:: Yes        Need for Family Participation in Patient Care: Yes (Comment) Care giver support system in place?: Yes (comment)   Criminal Activity/Legal Involvement Pertinent to Current Situation/Hospitalization: No - Comment as needed  Activities of Daily Living Home Assistive Devices/Equipment: None ADL Screening (condition at time of admission) Patient's  cognitive ability adequate to safely complete daily activities?: Yes Is the patient deaf or have difficulty hearing?: No Does the patient have difficulty seeing, even when wearing glasses/contacts?: No Does the patient have difficulty concentrating, remembering, or making decisions?: No Patient able to express need for assistance with ADLs?: Yes Does the patient have difficulty dressing or bathing?: No Independently performs ADLs?: Yes (appropriate for developmental age) Does the patient have difficulty walking or climbing stairs?: No Weakness of Legs: None Weakness of Arms/Hands: None  Permission Sought/Granted                  Emotional Assessment       Orientation: : Oriented to Self   Psych Involvement: Yes (comment)  Admission diagnosis:  SBO (small bowel obstruction) ([place]) [K56.609] Preop examination [Z01.818] Generalized abdominal pain [R10.84] Encounter for imaging study to confirm nasogastric (NG) tube placement [Z01.89] AKI (acute kidney injury) ([place]) [N17.9] Perforated sigmoid colon ([place]) [K63.1] Perforation of sigmoid colon ([place]) [K63.1] Abscess of sigmoid colon [K63.0] Patient Active Problem List   Diagnosis Date Noted   Protein-calorie malnutrition, severe 08/11/2021   Perforated sigmoid colon ([place]) 08/07/2021   Abscess of sigmoid colon 08/07/2021   Schizophrenia ([place]) 08/07/2021   SBO (small bowel obstruction) ([place]) 08/07/2021   Sepsis ([place]) 08/07/2021   PCP:  Medicine, [facility]:   [facility] [phone] -  [place], [place] [address] Phone: [phone] Fax: [place], [address] [place] [address] Phone: [phone] Fax: [phone]     Social Determinants of Health (SDOH) Interventions    Readmission Risk  Interventions No flowsheet data found.

## 2021-08-19 LAB — GLUCOSE, CAPILLARY
Glucose-Capillary: 136 mg/dL — ABNORMAL HIGH (ref 70–99)
Glucose-Capillary: 125 mg/dL — ABNORMAL HIGH (ref 70–99)
Glucose-Capillary: 96 mg/dL (ref 70–99)

## 2021-08-19 LAB — BASIC METABOLIC PANEL
Anion gap: 5 (ref 5–15)
BUN: 12 mg/dL (ref 6–20)
CO2: 25 mmol/L (ref 22–32)
Calcium: 8.2 mg/dL — ABNORMAL LOW (ref 8.9–10.3)
Chloride: 106 mmol/L (ref 98–111)
Creatinine, Ser: 0.72 mg/dL (ref 0.61–1.24)
GFR, Estimated: >60 mL/min (ref >60)
Glucose, Bld: 126 mg/dL — ABNORMAL HIGH (ref 70–99)
Potassium: 3.9 mmol/L (ref 3.5–5.1)
Sodium: 136 mmol/L (ref 135–145)

## 2021-08-19 MED ORDER — POLYETHYLENE GLYCOL 3350 17 G PO PACK
17.0000 g | PACK | Freq: Every day | ORAL | Status: DC | PRN
  Administered 2021-08-19: 17 g via ORAL
  Filled 2021-08-19: qty 1

## 2021-08-19 MED ORDER — ENSURE ENLIVE PO LIQD
237.0000 mL | Freq: Three times a day (TID) | ORAL | Status: DC
  Administered 2021-08-19 – 2021-08-22 (×9): 237 mL via ORAL

## 2021-08-19 MED ORDER — DOCUSATE SODIUM 100 MG PO CAPS
100.0000 mg | ORAL_CAPSULE | Freq: Two times a day (BID) | ORAL | Status: DC
  Administered 2021-08-19 – 2021-08-22 (×7): 100 mg via ORAL
  Filled 2021-08-19 (×7): qty 1

## 2021-08-19 MED ORDER — ADULT MULTIVITAMIN W/MINERALS CH
1.0000 | ORAL_TABLET | Freq: Every day | ORAL | Status: DC
  Administered 2021-08-19 – 2021-08-22 (×4): 1 via ORAL
  Filled 2021-08-19 (×4): qty 1

## 2021-08-19 NOTE — Progress Notes (Signed)
Pt ate a good breakfast this am, he refused lunch and dinner, 2 sips of ensure were taken this afternoon, peeing fine, colostomy has had 0 output today with the stool softer that was added and after giving Prn miralax.    Chrisandra Carota, RN 08/19/2021 6:03 PM

## 2021-08-19 NOTE — Progress Notes (Signed)
11 Days Post-Op  Subjective: Ate breakfast yesterday and today without nausea or emesis. No abdominal pain or bloating. Worked with therapies. He has been passing flatus but does not think he has had stool output recently  Objective: Vital signs in last 24 hours: Temp:  [97.8 F (36.6 C)-98.4 F (36.9 C)] 97.8 F (36.6 C) (01/17 0400) Pulse Rate:  [80-114] 99 (01/17 0400) Resp:  [14-20] 17 (01/17 0400) BP: (97-117)/(52-69) 97/69 (01/17 0400) SpO2:  [95 %-100 %] 100 % (01/17 0400) Last BM Date: 08/16/21  Intake/Output from previous day: 01/16 0701 - 01/17 0700 In: 4241.4 [P.O.:480; I.V.:3761.4] Out: 1860 [Urine:1850; Drains:10] Intake/Output this shift: No intake/output data recorded.  PE: Heart: regular Lungs: CTAB Abd: soft, midline wound with dressing c/d/i. No distension. Colostomy with pink and viable stoma, no gas or stool this am JP with scant SS fluid  Lab Results:  No results for input(s): WBC, HGB, HCT, PLT in the last 72 hours.  BMET Recent Labs    08/18/21 0455 08/19/21 0500  NA 136 136  K 4.2 3.9  CL 106 106  CO2 23 25  GLUCOSE 135* 126*  BUN 12 12  CREATININE 0.75 0.72  CALCIUM 8.6* 8.2*    PT/INR No results for input(s): LABPROT, INR in the last 72 hours. CMP     Component Value Date/Time   NA 136 08/19/2021 0500   K 3.9 08/19/2021 0500   CL 106 08/19/2021 0500   CO2 25 08/19/2021 0500   GLUCOSE 126 (H) 08/19/2021 0500   BUN 12 08/19/2021 0500   CREATININE 0.72 08/19/2021 0500   CALCIUM 8.2 (L) 08/19/2021 0500   PROT 6.2 (L) 08/18/2021 0455   ALBUMIN 2.4 (L) 08/18/2021 0455   AST 30 08/18/2021 0455   ALT 49 (H) 08/18/2021 0455   ALKPHOS 109 08/18/2021 0455   BILITOT 0.5 08/18/2021 0455   GFRNONAA >60 08/19/2021 0500   GFRAA >60 04/01/2020 1237   Lipase     Component Value Date/Time   LIPASE 22 08/06/2021 1922       Studies/Results: No results found.  Anti-infectives: Anti-infectives (From admission, onward)     Start     Dose/Rate Route Frequency Ordered Stop   08/11/21 1230  Ampicillin-Sulbactam (UNASYN) 3 g in sodium chloride 0.9 % 100 mL IVPB        3 g 200 mL/hr over 30 Minutes Intravenous Every 6 hours 08/11/21 1135 08/17/21 1127   08/09/21 1600  ceFAZolin (ANCEF) IVPB 2g/100 mL premix  Status:  Discontinued        2 g 200 mL/hr over 30 Minutes Intravenous Every 8 hours 08/09/21 1501 08/11/21 1135   08/07/21 0600  piperacillin-tazobactam (ZOSYN) IVPB 3.375 g  Status:  Discontinued        3.375 g 12.5 mL/hr over 240 Minutes Intravenous Every 8 hours 08/07/21 0544 08/09/21 1501   08/06/21 2100  piperacillin-tazobactam (ZOSYN) IVPB 3.375 g        3.375 g 12.5 mL/hr over 240 Minutes Intravenous  Once 08/06/21 2057 08/07/21 0900        Assessment/Plan POD 11, 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 Burr Medico for outpatient follow up -CEA  normal -CT chest 1/13 - no evidence of thoracic metastasis - TPN at half rate and tolerating soft diet. Likely dc TPN today. No colostomy output charted since 1/14-1/15. Discussed  with RN and will monitor output today -[place] RN orders placed to see if we can get this arranged for him. -abx therapy for E. Coli bacteremia, sensitive to ancef/unasyn completed -cont BID dressing changes -WOC help appreciated for new colostomy, mother will need a lot of teaching prior to discharge -WBC down trending at 12.5 1/14, AF -IS/pulm toilet -multi-modal pain control - JP drain SS with 10 ml output - may dc prior to discharge  - mobilize. No PT/OT follow up reccommended  FEN - wean PICC/TPN, continue soft diet VTE - Lovenox ID - zosyn, ancef, unasyn 1/9>>1/15 therapy for bacteremia completed  Schizophrenia - medications have been ordered  Dispo: TOC arranging [place]. Mom will need to be taught colostomy wound  care. Possible dc as early as tomorrow   LOS: 12 days   Winferd Humphrey, [facility] Surgery 08/19/2021, 7:36 AM Please see Amion for pager number during day hours 7:00am-4:30pm

## 2021-08-19 NOTE — Consult Note (Addendum)
[place] Nurse ostomy follow up TC to the mom [facility]). Trying to set up a time for her to be here for teaching and education of taking care of this patient's ostomy. She doesn't know when she will be here and states she wasn't told that she would have to take care of this. She continued to ask who I was and why didn't the doctor tell her she would have to take care of this. She asked several times to speak to the doctor and couldn't comprehend why I could not make that happen at the moment. She stated she had a delivery today and she had to be there, but couldn't tell me when the delivery was or when she could come to the hospital. I told her that I would communicate with his bedside nurse tomorrow and try to get her to let me know when she arrived and I would come to the room to talk to her and give her some teaching on how to take care of his colostomy. She then asked me was this not going to be taken away before he comes home. Again, I explained to her that this would not go away before he comes home and that [facility] would not be there everyday and that she would need to help the patient take care of it each day. She stated "well he will not come home for a while, I'm sure." Again, I told her I would try to see her tomorrow.   Signed him up for Secure Start services Welcome to [employer]! Thank you for choosing to enroll. A member of our team will be in touch shortly to explain our services, which include:  Personalized support from a [place], supplier, and insurance information Condition-specific information and connection to community resources If you have questions now, or in the future, please call us at [phone].  Cathlean Marseilles Tamala Julian, MSN, RN, [place], Lysle Pearl, [facility] Wound Treatment Associate Pager [phone]

## 2021-08-19 NOTE — Progress Notes (Signed)
PHARMACY - TOTAL PARENTERAL NUTRITION CONSULT NOTE  Indication: Prolonged ileus  Patient Measurements: Height: 5\' 9"  (175.3 cm) Weight: 60.4 kg (133 lb 2.5 oz) IBW/kg (Calculated) : 70.7 TPN AdjBW (KG): 60.4 Body mass index is 19.66 kg/m.  Assessment:  83 yom with schizophrenia admitted 1/4 with lower abdominal pain and N/V for 1 week, found to have perforated diverticulitis with abscess on CT not amenable to perc drain. He is also being treated for E coli bacteremia. He had an ex-lap, sigmoid colectomy with end colostomy on 1/6. Of note, patient is a limited historian however mother reported weight loss of 30-40 lb in last year. Patient has been NPO for the last 6 days and noted to have moderate malnutrition per RD. He is also at risk for refeeding syndrome. Pharmacy consulted to manage TPN.  Glucose / Insulin: no hx DM, CBGs controlled < 180. Used 2 units SSI in last 24hrs Electrolytes: all WNL Renal: AKI resolved - SCr < 1, BUN WNL Hepatic: LFTs stable, TG / tbili WNL, albumin 2.4 Intake / Output; MIVF: UOP 1.3 ml/kg/hr, drain overall down to 4mL, NGT removed, colostomy 73mL (liquid stool and gas), LR at 40 ml/hr GI Imaging: -1/5 CT: sigmoid colonic perf with abscess; 2 separate points of perf, possible underlying malignancy. 7.1 cm gas and fluid containing pericolonic abscess within the sigmoid mesentery. Marked inflammatory change of terminal ileum adjacent to pericolonic abscess with resultant small bowel obstruction  -1/11 Abd XR: unchanged ileus -1/12 CT: ileus, no drainable abscess or collection GI Surgeries / Procedures:  1/6: ex-lap, sigmoid colectomy with end colostomy  Central access: double lumen PICC 08/12/21 TPN start date: 08/12/21  Nutritional Goals: RD Estimated Needs Total Energy Estimated Needs: 2000-2200 Total Protein Estimated Needs: 100-110g Total Fluid Estimated Needs: >/=2.0L  Current Nutrition:  TPN Dysphagia diet - consume 100% of one meal on 1/15, then  refuses meal trays  Plan:  Discontinue TPN per Surgery Discontinue sensitive SSI and labs LR at 40 mL/hr per MD  Thank you for involving pharmacy in this patient's care.  Renold Genta, PharmD, BCPS Clinical Pharmacist Clinical phone for 08/19/2021 until 3p is [phone] 08/19/2021 7:26 AM  **Pharmacist phone directory can be found on [email or web address] listed under [place]**

## 2021-08-19 NOTE — Progress Notes (Signed)
Nutrition Follow-up  DOCUMENTATION CODES:   Severe malnutrition in context of acute illness/injury  INTERVENTION:   Ensure Enlive po TID, each supplement provides 350 kcal and 20 grams of protein.  Encourage good intake of meals and supplements.  MVI with minerals daily.  NUTRITION DIAGNOSIS:   Severe Malnutrition related to acute illness (sigmoid perforation, SBO) as evidenced by moderate fat depletion, severe muscle depletion.  Ongoing   GOAL:   Provide needs based on ASPEN/SCCM guidelines  Progressing  MONITOR:   Diet advancement, Labs, Weight trends, I & O's  REASON FOR ASSESSMENT:   Malnutrition Screening Tool    ASSESSMENT:   Pt admitted from home with abdominal pain x1 week secondary to 2 sites of sigmoid perforation and 7.1 cm abscess causing inflammation of terminal ileum which in turn is causing SBO. PMH includes schizophrenia  TPN is being discontinued today.  Diet has been advanced to dysphagia 3 with thin liquids.  Patient is tolerating PO diet well.  Meal intakes documented at 90-100% for the past 2 meals recorded.  Encouraged good intake of meals. Patient agreed to try Ensure Enlive supplements to maximize oral intake of protein and calories.   Ostomy output: none recorded x 24 hours, but there is liquid stool in the bag.  JP drain with 10 ml output x 24 hours. UOP 1850 ml x 24 hours  Labs reviewed.  CBG: 136-125  Medications reviewed and include Colace.   Diet Order:   Diet Order             DIET DYS 3 Room service appropriate? Yes with Assist; Fluid consistency: Thin  Diet effective now                   EDUCATION NEEDS:   No education needs have been identified at this time  Skin:  Skin Assessment: Skin Integrity Issues: Skin Integrity Issues:: Incisions Incisions: abdomen  Last BM:  1/12 type 6  Height:   Ht Readings from Last 1 Encounters:  08/07/21 5\' 9"  (1.753 m)    Weight:   Wt Readings from Last 1  Encounters:  08/19/21 59.6 kg    BMI:  Body mass index is 19.4 kg/m.  Estimated Nutritional Needs:   Kcal:  2000-2200  Protein:  100-110g  Fluid:  >/=2.0L    Lucas Mallow RD, LDN, CNSC Please refer to Amion for contact information.

## 2021-08-19 NOTE — Progress Notes (Signed)
Hello Juanda Crumble,  Art gallery manager to [employer]? services! At [facility], we pride ourselves on delivering a positive customer experience guided by [place] to make life more rewarding and dignified for people who use our products and services.  You may have questions about your condition and how to keep living the life you want to live, but you dont have to figure things out on your own. Were here to help! [facility] Secure Start services offers free dedicated product support for as long as you need it, regardless of the brand of products you use.  What to Do Now  Save [phone] to your cell phone contacts. This will help you recognize when we might be calling you. Expect a phone call approximately 72 business hours from joining [employer]. Well explain how we may be able to help. Gather the following materials for our conversation: Nutritional therapist information Current product numbers, if available We look forward to speaking with you soon! We can be reached Monday through Friday from 8:00am to 5:00pm CST, at [phone] or [email or web address].  Best Regards,  [facility] Secure Start? [facility] [address] [place] [address] [phone]

## 2021-08-20 LAB — GLUCOSE, CAPILLARY
Glucose-Capillary: 100 mg/dL — ABNORMAL HIGH (ref 70–99)
Glucose-Capillary: 117 mg/dL — ABNORMAL HIGH (ref 70–99)

## 2021-08-20 MED ORDER — SENNA 8.6 MG PO TABS
1.0000 | ORAL_TABLET | Freq: Every day | ORAL | Status: DC
  Administered 2021-08-20 – 2021-08-21 (×2): 8.6 mg via ORAL
  Filled 2021-08-20 (×2): qty 1

## 2021-08-20 MED ORDER — POLYETHYLENE GLYCOL 3350 17 G PO PACK
17.0000 g | PACK | Freq: Two times a day (BID) | ORAL | Status: DC
  Administered 2021-08-20 – 2021-08-22 (×4): 17 g via ORAL
  Filled 2021-08-20 (×4): qty 1

## 2021-08-20 MED ORDER — TRAMADOL HCL 50 MG PO TABS: 50.0000 mg | ORAL_TABLET | Freq: Four times a day (QID) | ORAL | Status: DC | PRN

## 2021-08-20 NOTE — Plan of Care (Signed)
°  Problem: Clinical Measurements: Goal: Ability to maintain clinical measurements within normal limits will improve Outcome: Progressing   Problem: Clinical Measurements: Goal: Respiratory complications will improve Outcome: Progressing   Problem: Clinical Measurements: Goal: Cardiovascular complication will be avoided Outcome: Progressing   Problem: Nutrition: Goal: Adequate nutrition will be maintained Outcome: Progressing   Problem: Pain Managment: Goal: General experience of comfort will improve Outcome: Progressing   Problem: Skin Integrity: Goal: Risk for impaired skin integrity will decrease Outcome: Progressing

## 2021-08-20 NOTE — Consult Note (Signed)
[place] Nurse ostomy follow up Patient receiving care in [facility] 4E12 Stoma type/location: LMQ colostomy Stomal assessment/size: pink/brown with mucus surrounding, sutures in place Peristomal assessment: intact Treatment options for stomal/peristomal skin: Skin barrier wipes and barrier rings Output: none Ostomy pouching: 1pc. Flexible convex Education provided: Attempted to teach the patient how to change the pouch. He was able to remove pouch, open the existing pouch and close back. Had problems with comprehending the lock and roll with the new pouch. Would roll twice but not close the last roll and would forget to velcro the last roll. He was able to clean around the stoma but again with me telling him step by step what to do. He was not able to cut the pouch to size. I am not sure if he comprehended when the pouch has to be emptied and when the pouch has to be changed. He kept asking me, "what do I have this thing for?" His mother was very addiment about not participating in the teaching and that she "Would Not, take care of that thing at home. He will have to have a nurse aid come in daily to empty it and a nurse to come and change it twice a week." The mother then proceeded to ask, "why is he going home before he is well, why is he not staying until he gets rid of that thing?" I explained to her that it could be six months before this is reversed and that he can not stay in the hospital that long. Mom replied, "oh yes he can, he will stay here until he is well!" I do not believe that this patient or the mom will take care of his ostomy at home. He needs help and can not do this by himself. I will enroll him in secure start services but I do not think that the mother will help or be able to order supplies that he needs at home.  Enrolled patient in [place] Start Discharge program: Yes  Cathlean Marseilles. Tamala Julian, MSN, RN, [place], Lysle Pearl, [facility] Wound Treatment Associate Pager [phone]

## 2021-08-20 NOTE — Progress Notes (Signed)
12 Days Post-Op  Subjective: Eating almost all of breakfast daily but no significant further intake throughout the day. He states he gets nausea a little while after eating and also does not have an appetite after breakfast. No emesis. No worsening abdominal pain. No stool output last few days but he is passing flatus  Objective: Vital signs in last 24 hours: Temp:  [97.7 F (36.5 C)-98.8 F (37.1 C)] 97.9 F (36.6 C) (01/18 0746) Pulse Rate:  [87-104] 93 (01/18 0746) Resp:  [16-20] 17 (01/18 0746) BP: (97-120)/(63-82) 97/66 (01/18 0746) SpO2:  [96 %-100 %] 100 % (01/18 0746) Weight:  [59.6 kg] 59.6 kg (01/17 1121) Last BM Date: 08/16/21  Intake/Output from previous day: 01/17 0701 - 01/18 0700 In: 1435.2 [P.O.:476; I.V.:959.2] Out: 1500 [Urine:1490; Drains:10] Intake/Output this shift: Total I/O In: -  Out: 500 [Urine:500]  PE: Heart: regular Lungs: CTAB Abd: soft, midline wound with dressing c/d/i. Very mild distension. Colostomy with pink and viable stoma, no gas or stool this am JP with scant SS fluid  Lab Results:  No results for input(s): WBC, HGB, HCT, PLT in the last 72 hours.  BMET Recent Labs    08/18/21 0455 08/19/21 0500  NA 136 136  K 4.2 3.9  CL 106 106  CO2 23 25  GLUCOSE 135* 126*  BUN 12 12  CREATININE 0.75 0.72  CALCIUM 8.6* 8.2*    PT/INR No results for input(s): LABPROT, INR in the last 72 hours. CMP     Component Value Date/Time   NA 136 08/19/2021 0500   K 3.9 08/19/2021 0500   CL 106 08/19/2021 0500   CO2 25 08/19/2021 0500   GLUCOSE 126 (H) 08/19/2021 0500   BUN 12 08/19/2021 0500   CREATININE 0.72 08/19/2021 0500   CALCIUM 8.2 (L) 08/19/2021 0500   PROT 6.2 (L) 08/18/2021 0455   ALBUMIN 2.4 (L) 08/18/2021 0455   AST 30 08/18/2021 0455   ALT 49 (H) 08/18/2021 0455   ALKPHOS 109 08/18/2021 0455   BILITOT 0.5 08/18/2021 0455   GFRNONAA >60 08/19/2021 0500   GFRAA >60 04/01/2020 1237   Lipase     Component Value  Date/Time   LIPASE 22 08/06/2021 1922       Studies/Results: No results found.  Anti-infectives: Anti-infectives (From admission, onward)    Start     Dose/Rate Route Frequency Ordered Stop   08/11/21 1230  Ampicillin-Sulbactam (UNASYN) 3 g in sodium chloride 0.9 % 100 mL IVPB        3 g 200 mL/hr over 30 Minutes Intravenous Every 6 hours 08/11/21 1135 08/17/21 1127   08/09/21 1600  ceFAZolin (ANCEF) IVPB 2g/100 mL premix  Status:  Discontinued        2 g 200 mL/hr over 30 Minutes Intravenous Every 8 hours 08/09/21 1501 08/11/21 1135   08/07/21 0600  piperacillin-tazobactam (ZOSYN) IVPB 3.375 g  Status:  Discontinued        3.375 g 12.5 mL/hr over 240 Minutes Intravenous Every 8 hours 08/07/21 0544 08/09/21 1501   08/06/21 2100  piperacillin-tazobactam (ZOSYN) IVPB 3.375 g        3.375 g 12.5 mL/hr over 240 Minutes Intravenous  Once 08/06/21 2057 08/07/21 0900        Assessment/Plan POD 12, 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 Burr Medico for outpatient follow up -CEA  normal -CT chest 1/13 - no evidence of thoracic metastasis - TPN stopped. Tolerating soft diet overall with low appetite- some nausea. No stool output recently. Increase bowel regimen - daily docusate, daily senokot, bid miralax. ambulate -HH RN orders placed to see if we can get this arranged for him. -abx therapy for E. Coli bacteremia, sensitive to ancef/unasyn completed -cont BID dressing changes -WOC help appreciated for new colostomy, mother will need a lot of teaching prior to discharge - called mother to discuss this yesterday and hopefully will have in person discussion today. She is very anxious about caring for colostomy and about him coming home -WBC down trending at 12.5 1/14, AF -IS/pulm toilet -multi-modal pain control - JP drain  SS with 10 ml output -  remove prior to discharge  - mobilize. No PT/OT follow up reccommended  FEN - soft diet VTE - Lovenox ID - zosyn, ancef, unasyn 1/9>>1/15 therapy for bacteremia completed  Schizophrenia - medications have been ordered  Dispo: TOC arranging [place]. Mom will need to be taught colostomy wound care. Bowel regimen. Home soon   LOS: 13 days   Matthew [name], [facility] Surgery 08/20/2021, 8:19 AM Please see Amion for pager number during day hours 7:00am-4:30pm

## 2021-08-20 NOTE — TOC Progression Note (Signed)
Transition of Care (TOC) - Progression Note  Marvetta Gibbons RN, BSN Transitions of Care Unit 4E- RN Case Manager See Treatment Team for direct phone #    Patient Details  Name: Matthew [name] MRN: [id number] Date of Birth: 11-04-1965  Transition of Care [facility]) CM/SW Contact  Dahlia Client Romeo Rabon, RN Phone Number: 08/20/2021, 11:17 AM  Clinical Narrative:    Notified by attending team that pt nearing medically readiness for transition home. [place] team trying to reach mom to schedule bedside education time.  CM reached backout to [place] with [facility] to follow up on [facility] needs and to see if [facility] will be able to accept referral at this time. Hoyle Sauer to check on staffing and approval to accept.  1600- received call back from [facility]- per Hoyle Sauer they are able to accept referral pending confirmation mom is a willing/able "teachable" caregiver. Per provider note- they have spoken with mom and she is planning on coming to hospital tomorrow - will follow up tomorrow after education with mother.    Expected Discharge Plan: [place] Barriers to Discharge: Continued Medical Work up  Expected Discharge Plan and Services Expected Discharge Plan: [place]   Discharge Planning Services: CM Consult Post Acute Care Choice: [place] arrangements for the past 2 months: [place]: RN [facility] Agency: [place] Date [place]: 08/19/21 Time HH Agency Contacted: 1500 Representative spoke with at [place]: [place] ([place]) Interventions    Readmission Risk Interventions No flowsheet data found.

## 2021-08-20 NOTE — Progress Notes (Signed)
Occupational Therapy Treatment/Discharge Patient Details Name: Matthew [name] MRN: [id number] DOB: August 28, 1965 Today's Date: 08/20/2021   History of present illness 56 y.o. male presenting to ED 1/4 with lower abdominal pain and N/V x1 week. Work-up (+) 2 perforations of sigmoid colon and SBO secondary to 7.1cm abscess. S/p ex lap 1/6 with partial colectomy and end colostomy. PMHx significant for schizophrenia.   OT comments  Session focused on LB ADLs, body mechanics to minimize abdominal discomfort and tub transfer assessment. Pt able to perform LB ADLs, tub transfer and mobility in room without AD and without physical assist. Pt able to demo ability to bend to pick up item from floor without reports of pain, as well. Pt does benefit from intermittent sequencing cues but able to assist in problem solving strategies, likely baseline cognition. Pt lives with his family who can assist at discharge. No further skilled OT services needed at acute level or on DC. Encouraged continued mobility with mobility specialists during admission.   Recommendations for follow up therapy are one component of a multi-disciplinary discharge planning process, led by the attending physician.  Recommendations may be updated based on patient status, additional functional criteria and insurance authorization.    Follow Up Recommendations  No OT follow up    Assistance Recommended at Discharge Intermittent Supervision/Assistance  Patient can return home with the following  A little help with bathing/dressing/bathroom;Assistance with cooking/housework   Equipment Recommendations  None recommended by OT    Recommendations for Other Services      Precautions / Restrictions Precautions Precautions: Fall Precaution Comments: JP drain to R abdomen; abdominal incision w/ precautions; colostomy Restrictions Weight Bearing Restrictions: No       Mobility Bed Mobility Overal bed mobility: Modified Independent Bed  Mobility: Sidelying to Sit, Sit to Sidelying, Rolling Rolling: Modified independent (Device/Increase time) Sidelying to sit: Modified independent (Device/Increase time)     Sit to sidelying: Modified independent (Device/Increase time)      Transfers Overall transfer level: Independent Equipment used: None Transfers: Sit to/from Stand Sit to Stand: Independent                 Balance Overall balance assessment: Needs assistance Sitting-balance support: Feet supported, No upper extremity supported Sitting balance-Leahy Scale: Good     Standing balance support: During functional activity, No upper extremity supported Standing balance-Leahy Scale: Good                             ADL either performed or assessed with clinical judgement   ADL Overall ADL's : Needs assistance/impaired     Grooming: Standing;Wash/dry face;Oral care;Independent Grooming Details (indicate cue type and reason): without LOB             Lower Body Dressing: Modified independent;Sit to/from stand Lower Body Dressing Details (indicate cue type and reason): cued to trial figure four position to minimize abdominal pain         Tub/ Shower Transfer: Supervision/safety;Tub transfer;Ambulation Tub/Shower Transfer Details (indicate cue type and reason): simulated tub height, able to step over without UE support Functional mobility during ADLs: Supervision/safety General ADL Comments: Improving ability to complete ADLs when cued for compensatory strategies. pt able to demo ability to bend to pick up item from floor without LOB and denies discomfort with trunk flexion    Extremity/Trunk Assessment Upper Extremity Assessment Upper Extremity Assessment: Overall WFL for tasks assessed   Lower Extremity Assessment Lower Extremity Assessment: Defer to PT  evaluation        Vision   Vision Assessment?: No apparent visual deficits   Perception     Praxis      Cognition  Arousal/Alertness: Awake/alert Behavior During Therapy: Flat affect Overall Cognitive Status: No family/caregiver present to determine baseline cognitive functioning                                 General Comments: hx of schizophrenia, flat affect, follows directions well, delayed responses        Exercises      Shoulder Instructions       General Comments VSS on RA    Pertinent Vitals/ Pain       Pain Assessment Pain Assessment: No/denies pain  Home Living                                          Prior Functioning/Environment              Frequency  Min 2X/week        Progress Toward Goals  OT Goals(current goals can now be found in the care plan section)  Progress towards OT goals: Progressing toward goals;Goals met/education completed, patient discharged from OT  Acute Rehab OT Goals Patient Stated Goal: none stated OT Goal Formulation: All assessment and education complete, DC therapy Potential to Achieve Goals: Good ADL Goals Pt Will Perform Grooming: with modified independence;standing Pt Will Perform Upper Body Dressing: with modified independence Pt Will Perform Lower Body Dressing: with modified independence;sit to/from stand Pt Will Transfer to Toilet: with modified independence;ambulating;regular height toilet Pt Will Perform Toileting - Clothing Manipulation and hygiene: with modified independence;sit to/from stand Pt Will Perform Tub/Shower Transfer: Tub transfer;with modified independence Additional ADL Goal #1: Patient will maintain dymaic standing balance with Mod I in prep for ADLs.  Plan Discharge plan remains appropriate;All goals met and education completed, patient discharged from OT services    Co-evaluation                 AM-PAC OT "6 Clicks" Daily Activity     Outcome Measure   Help from another person eating meals?: None Help from another person taking care of personal grooming?:  None Help from another person toileting, which includes using toliet, bedpan, or urinal?: A Little Help from another person bathing (including washing, rinsing, drying)?: A Little Help from another person to put on and taking off regular upper body clothing?: None Help from another person to put on and taking off regular lower body clothing?: None 6 Click Score: 22    End of Session    OT Visit Diagnosis: Unsteadiness on feet (R26.81);Muscle weakness (generalized) (M62.81)   Activity Tolerance Patient tolerated treatment well   Patient Left in bed;with call bell/phone within reach;with bed alarm set   Nurse Communication          Time: 2878-6767 OT Time Calculation (min): 17 min  Charges: OT General Charges $OT Visit: 1 Visit OT Treatments $Self Care/Home Management : 8-22 mins  Malachy Chamber, OTR/L Acute Rehab Services Office: [phone]   Layla Maw 08/20/2021, 12:15 PM

## 2021-08-21 LAB — GLUCOSE, CAPILLARY
Glucose-Capillary: 90 mg/dL (ref 70–99)
Glucose-Capillary: 108 mg/dL — ABNORMAL HIGH (ref 70–99)

## 2021-08-21 NOTE — Progress Notes (Signed)
Tried calling patient's mother 3 times today to discuss further colostomy care teaching prior to discharge. No answer. Will continue to try to contact

## 2021-08-21 NOTE — Progress Notes (Signed)
13 Days Post-Op  Subjective: Appetite improving - ate breakfast and lunch yesterday. Still with some intermittent nausea but having passing flatus and stool in colostomy.  Objective: Vital signs in last 24 hours: Temp:  [97.9 F (36.6 C)-98.2 F (36.8 C)] 98.2 F (36.8 C) (01/19 0312) Pulse Rate:  [92-107] 92 (01/19 0312) Resp:  [16-19] 17 (01/19 0312) BP: (97-116)/(65-79) 99/65 (01/19 0312) SpO2:  [98 %-100 %] 98 % (01/19 0312) Last BM Date: 08/16/21  Intake/Output from previous day: 01/18 0701 - 01/19 0700 In: 1273.9 [P.O.:949; I.V.:324.9] Out: 1160 [Urine:1150; Drains:10] Intake/Output this shift: Total I/O In: -  Out: 400 [Urine:400]  PE: Heart: regular Lungs: CTAB Abd: soft, midline wound with beefy red grannulation tissue and small amount of bleeding - no purulence, erythema or induration. Colostomy with large amount of soft brown stool JP with scant SS fluid  Lab Results:  No results for input(s): WBC, HGB, HCT, PLT in the last 72 hours.  BMET Recent Labs    08/19/21 0500  NA 136  K 3.9  CL 106  CO2 25  GLUCOSE 126*  BUN 12  CREATININE 0.72  CALCIUM 8.2*    PT/INR No results for input(s): LABPROT, INR in the last 72 hours. CMP     Component Value Date/Time   NA 136 08/19/2021 0500   K 3.9 08/19/2021 0500   CL 106 08/19/2021 0500   CO2 25 08/19/2021 0500   GLUCOSE 126 (H) 08/19/2021 0500   BUN 12 08/19/2021 0500   CREATININE 0.72 08/19/2021 0500   CALCIUM 8.2 (L) 08/19/2021 0500   PROT 6.2 (L) 08/18/2021 0455   ALBUMIN 2.4 (L) 08/18/2021 0455   AST 30 08/18/2021 0455   ALT 49 (H) 08/18/2021 0455   ALKPHOS 109 08/18/2021 0455   BILITOT 0.5 08/18/2021 0455   GFRNONAA >60 08/19/2021 0500   GFRAA >60 04/01/2020 1237   Lipase     Component Value Date/Time   LIPASE 22 08/06/2021 1922       Studies/Results: No results found.  Anti-infectives: Anti-infectives (From admission, onward)    Start     Dose/Rate Route Frequency  Ordered Stop   08/11/21 1230  Ampicillin-Sulbactam (UNASYN) 3 g in sodium chloride 0.9 % 100 mL IVPB        3 g 200 mL/hr over 30 Minutes Intravenous Every 6 hours 08/11/21 1135 08/17/21 1127   08/09/21 1600  ceFAZolin (ANCEF) IVPB 2g/100 mL premix  Status:  Discontinued        2 g 200 mL/hr over 30 Minutes Intravenous Every 8 hours 08/09/21 1501 08/11/21 1135   08/07/21 0600  piperacillin-tazobactam (ZOSYN) IVPB 3.375 g  Status:  Discontinued        3.375 g 12.5 mL/hr over 240 Minutes Intravenous Every 8 hours 08/07/21 0544 08/09/21 1501   08/06/21 2100  piperacillin-tazobactam (ZOSYN) IVPB 3.375 g        3.375 g 12.5 mL/hr over 240 Minutes Intravenous  Once 08/06/21 2057 08/07/21 0900        Assessment/Plan POD 13, 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 Burr Medico for outpatient follow up -CEA  normal -CT chest 1/13 - no evidence of thoracic metastasis - Tolerating soft diet, appetite improving. Good bowel function with stool in colostomy this am. Bowel regimen - daily docusate, daily senokot, bid miralax. ambulate -HH RN orders placed to  see if we can get this arranged for him. -abx therapy for E. Coli bacteremia, sensitive to ancef/unasyn completed -cont BID dressing changes -WBC down trending at 12.5 1/14, AF -IS/pulm toilet -multi-modal pain control - remove JP drain  - mobilize. No PT/OT follow up recommended  -WOC help appreciated for new colostomy, mother will need a lot of teaching prior to discharge Myself and Dr. Rosendo Gros had long discussion with mom and patient's cousin yesterday in regard to patient care and disposition planning. She is hesitant but willing to learn colostomy care. Will reach out to her today in regard to coming to the hospital for further teaching  FEN - soft diet VTE - Lovenox ID -  zosyn, ancef, unasyn 1/9>>1/15 therapy for bacteremia completed  Schizophrenia - medications have been ordered  Dispo: TOC arranging [place]. Mom will need to be taught colostomy wound care. Medically stable for discharge   LOS: 14 days   Winferd Humphrey, [facility] 08/21/2021, 7:44 AM Please see Amion for pager number during day hours 7:00am-4:30pm

## 2021-08-21 NOTE — Discharge Instructions (Addendum)
Follow up with PCP or whichever provider manages psychiatric meds. You are due for your paliperidone (INVEGA SUSTENNA) injection within one week of discharge  Wet to Dry WOUND CARE: - Change dressing twice daily - Supplies: sterile saline, kerlex, scissors, ABD pads, tape  Remove dressing and all packing carefully, moistening with sterile saline as needed to avoid packing/internal dressing sticking to the wound. 2.   Clean edges of skin around the wound with water/gauze, making sure there is no tape debris or leakage left on skin that could cause skin irritation or breakdown. 3.   Dampen and clean kerlex with sterile saline and pack wound from wound base to skin level, making sure to take note of any possible areas of wound tracking, tunneling and packing appropriately. Wound can be packed loosely. Trim kerlex to size if a whole kerlex is not required. 4.   Cover wound with a dry ABD pad and secure with tape.  5.   Write the date/time on the dry dressing/tape to better track when the last dressing change occurred. - apply any skin protectant/powder if recommended by clinician to protect skin/skin folds. - change dressing as needed if leakage occurs, wound gets contaminated, or patient requests to shower. - You may shower daily with wound open and following the shower the wound should be dried and a clean dressing placed.  - Medical grade tape as well as packing supplies can be found at [employer] on [address] or [employer] on [address]. The remaining supplies can be found at your local drug store, [employer] etc.    [facility], [place] [phone]  OPEN ABDOMINAL SURGERY: POST OP INSTRUCTIONS  Always review your discharge instruction sheet given to you by the facility where your surgery was performed.  IF YOU HAVE DISABILITY OR FAMILY LEAVE FORMS, YOU MUST BRING THEM TO THE OFFICE FOR PROCESSING.  PLEASE DO NOT GIVE THEM TO YOUR DOCTOR.  A  prescription for pain medication may be given to you upon discharge.  Take your pain medication as prescribed, if needed.  If narcotic pain medicine is not needed, then you may take acetaminophen (Tylenol) or ibuprofen (Advil) as needed. Take your usually prescribed medications unless otherwise directed. If you need a refill on your pain medication, please contact your pharmacy. They will contact our office to request authorization.  Prescriptions will not be filled after 5pm or on week-ends. You should follow a light diet the first few days after arrival home, such as soup and crackers, pudding, etc.unless your doctor has advised otherwise. A high-fiber, low fat diet can be resumed as tolerated.   Be sure to include lots of fluids daily. Most patients will experience some swelling and bruising on the chest and neck area.  Ice packs will help.  Swelling and bruising can take several days to resolve Most patients will experience some swelling and bruising in the area of the incision. Ice pack will help. Swelling and bruising can take several days to resolve..  It is common to experience some constipation if taking pain medication after surgery.  Increasing fluid intake and taking a stool softener will usually help or prevent this problem from occurring.  A mild laxative (Milk of Magnesia or Miralax) should be taken according to package directions if there are no bowel movements after 48 hours.  You may have steri-strips (small skin tapes) in place directly over the incision.  These strips should be left on the skin  for 7-10 days.  If your surgeon used skin glue on the incision, you may shower in 24 hours.  The glue will flake off over the next 2-3 weeks.  Any sutures or staples will be removed at the office during your follow-up visit. You may find that a light gauze bandage over your incision may keep your staples from being rubbed or pulled. You may shower and replace the bandage daily. ACTIVITIES:  You may  resume regular (light) daily activities beginning the next day--such as daily self-care, walking, climbing stairs--gradually increasing activities as tolerated.  You may have sexual intercourse when it is comfortable.  Refrain from any heavy lifting or straining until approved by your doctor. You may drive when you no longer are taking prescription pain medication, you can comfortably wear a seatbelt, and you can safely maneuver your car and apply brakes  You should see your doctor in the office for a follow-up appointment approximately two weeks after your surgery.  Make sure that you call for this appointment within a day or two after you arrive home to insure a convenient appointment time. OTHER INSTRUCTIONS:  _____________________________________________________________ _____________________________________________________________  WHEN TO CALL YOUR DOCTOR: Fever over 101.0 Inability to urinate Nausea and/or vomiting Extreme swelling or bruising Continued bleeding from incision. Increased pain, redness, or drainage from the incision. Difficulty swallowing or breathing Muscle cramping or spasms. Numbness or tingling in hands or feet or around lips.  The clinic staff is available to answer your questions during regular business hours.  Please dont hesitate to call and ask to speak to one of the nurses if you have concerns.  For further questions, please visit [email or web address]

## 2021-08-21 NOTE — Progress Notes (Signed)
Patient had 100cc of output in colostomy this am. Education was done with patient on how to empty. With verbal cues patient opened end of bag and squeezed contents into container. I asked patient if he can do this at home and clean it afterwards and he said " I dont know". Patient needs many verbal cues to perform task. To me he does not display enough confidence to perform task at home.    JP drain removed, incisions clean dry intact, dry bandage was applied.   Chrisandra Carota, RN 08/21/2021 9:13 AM

## 2021-08-21 NOTE — Progress Notes (Signed)
Mobility Specialist: Progress Note   08/21/21 1210  Mobility  Activity Ambulated independently in hallway  Level of Assistance Independent  Assistive Device None  Distance Ambulated (ft) 1000 ft  Activity Response Tolerated well  $Mobility charge 1 Mobility   Pre-Mobility: 102 HR During Mobility: 127 HR Post-Mobility: 102 HR  Received pt in bed having no complaints and agreeable to mobility. Asymptomatic throughout ambulation, returned back to bed w/ call bell in reach and all needs met. Bed alarm is on.    [facility] Kiylah Loyer Mobility Specialist Mobility Specialist [address] [place]: [phone] Mobility Specialist 2 [place]: [phone]

## 2021-08-22 DIAGNOSIS — Z933 Colostomy status: Secondary | ICD-10-CM

## 2021-08-22 DIAGNOSIS — Z9889 Other specified postprocedural states: Secondary | ICD-10-CM

## 2021-08-22 MED ORDER — ADULT MULTIVITAMIN W/MINERALS CH: 1.0000 | ORAL_TABLET | Freq: Every day | ORAL | Status: AC

## 2021-08-22 MED ORDER — DOCUSATE SODIUM 100 MG PO CAPS: 100.0000 mg | ORAL_CAPSULE | Freq: Two times a day (BID) | ORAL | 0 refills | Status: AC

## 2021-08-22 MED ORDER — ACETAMINOPHEN 500 MG PO TABS: 1000.0000 mg | ORAL_TABLET | Freq: Four times a day (QID) | ORAL | 0 refills | Status: AC | PRN

## 2021-08-22 MED ORDER — POLYETHYLENE GLYCOL 3350 17 G PO PACK
17.0000 g | PACK | Freq: Every day | ORAL | 0 refills | Status: AC | PRN
Start: 2021-08-22 — End: ?

## 2021-08-22 NOTE — Progress Notes (Signed)
Pt discharged to home with family.  Education for wound care completed with family.  Pt taken off temerity and CCMD notified.  Pt's IV removed.  AVS documentation reviewed and sent home with Pt and family.

## 2021-08-22 NOTE — Discharge Summary (Signed)
[facility] Discharge Summary   Patient ID: Matthew [name] MRN: [id number] DOB/AGE: [birth year] 56 y.o.  Admit date: 08/06/2021 Discharge date: 08/22/2021  Admitting Diagnosis: SBO (small bowel obstruction) ([facility]) [K56.609] Generalized abdominal pain [R10.84] AKI (acute kidney injury) ([place]) [N17.9] Perforated sigmoid colon ([place]) [K63.1] Abscess of sigmoid colon [K63.0]  Discharge Diagnosis Patient Active Problem List   Diagnosis Date Noted   History of exploratory laparotomy 08/22/2021   S/P colostomy ([place]) 08/22/2021   Protein-calorie malnutrition, severe 08/11/2021   Schizophrenia ([place]) 08/07/2021  Bacteremia - E coli   Consultants [facility] Interventional radiology  Imaging: No results found.  Procedures Dr. Kae Heller (08/08/21) - Exploratory laparotomy, sigmoid colectomy with end colostomy  Hospital Course:  56 year old male with medical history of schizophrenia who presented to [name] ED with 7 to 10-day history of vague left lower quadrant pain with associated nausea and emesis. Workup showed perforated sigmoid colon with free intraperitoneal gas and sigmoid colon inflammation with pericolonic abscess concerning for perforated diverticulitis vs cancer. Imaging was reviewed by interventional radiology who concluded abscess was not amenable to percutaneous drainage. Patient was admitted initially to the hospitalist team and underwent procedure listed above.  Tolerated procedure well and was transferred to the floor. Care was transferred to general surgery as primary team. Intraoperative findings included Perforated segment of sigmoid colon with tethering of the mesentery, associated abscess/purulent peritonitis and reactive small bowel obstruction.  He initially had a nasogastric tube  and bowel function monitored. Nasogastric tube was removed following return of bowel function and diet was advanced as tolerated. He developed an ileus and low appetite which  resolved by date of discharge. Due to obstruction and ileus he was on TPN with PICC during admission which was weaned off prior to discharge once bowel function improved and tolerating diet.  Blood cultures grew E coli and he completed appropriate course of IV antibiotics for bacteremia.  Pathology returned as invasive moderately differentiated adenocarcinoma with 1/12 lymph nodes positive. CEA collected which was within normal limits and CT chest without evidence of thoracic metastasis and repeat CT abdomen post op without acute complications.  On POD14, the patient was voiding well, tolerating diet, ambulating well, pain well controlled, vital signs stable, incisions c/d/i and felt stable for discharge home. JP drain remained serosanguinous with low output and was removed prior to discharged.  [name] nursing worked with patient throughout admission as well provided education to mother for colostomy care and referral was placed for follow up with [name]. Open incisional midline wound was managed with wet to dry packing and CM arranged [name] prior to discharge to continue wound care at time. Follow up was scheduled with Dr. Burr Medico - medical oncology - prior to discharge. Patient will follow up in our office in 2-3 weeks and he and mother know to call with questions or concerns.     Discharge instructions and follow up plans were discussed with mother and patient prior to discharge and all questions answered.  Allergies as of 08/22/2021   No Known Allergies      Medication List     TAKE these medications    acetaminophen 500 MG tablet Commonly known as: TYLENOL Take 2 tablets (1,000 mg total) by mouth every 6 (six) hours as needed for mild pain or moderate pain.   aspirin EC 81 MG tablet Take 81 mg by mouth daily as needed (for pain or headaches).   benztropine 1 MG tablet Commonly known as: COGENTIN Take 1  mg at bedtime by mouth.   cetirizine 10 MG tablet Commonly known as:  ZYRTEC Take 10 mg by mouth daily as needed for allergies.   docusate sodium 100 MG capsule Commonly known as: COLACE Take 1 capsule (100 mg total) by mouth 2 (two) times daily for 5 days. Continue taking until having regular stool output in colostomy. At that point can take as needed for constipation   multivitamin with minerals Tabs tablet Take 1 tablet by mouth daily. Start taking on: August 23, 2021   paliperidone 156 MG/ML Susp injection Commonly known as: INVEGA SUSTENNA Inject 156 mg every 30 (thirty) days into the muscle.   polyethylene glycol 17 g packet Commonly known as: MIRALAX / GLYCOLAX Take 17 g by mouth daily as needed for mild constipation or moderate constipation.          Follow-up Information     Home, Medi Follow up.   Why: [facility] Contact information: [place] [address] [phone]         Clovis Riley, MD Follow up on 09/09/2021.   Specialty: General Surgery Why: 140pm. Please arrive 30 minutes prior to your appointment for paperwork. Please arrive 30 minutes prior to your appointment for paperwork. Contact information: [address] [place] [address] [phone]                 Signed: [name] [name] 08/22/2021, 2:37 PM Please see Amion for pager number during day hours 7:00am-4:30pm

## 2021-08-22 NOTE — TOC Transition Note (Signed)
Transition of Care (TOC) - CM/SW Discharge Note Marvetta Gibbons RN, BSN Transitions of Care Unit 4E- RN Case Manager See Treatment Team for direct phone #    Patient Details  Name: Barnet Benavides MRN: [id number] Date of Birth: 09/25/1965  Transition of Care [facility]) CM/SW Contact:  Dawayne Patricia, RN Phone Number: 08/22/2021, 2:55 PM   Clinical Narrative:    Pt stable for transition home today. WOC RN has met with pt and mom at bedside for additional teaching (see note). Cm also spoke with mom at bedside to review [facility] arrangements. Mom reports she has spoken with someone from [facility] regarding [facility] visits. They are to call and schedule visit for Sunday. Mom still seems hesitant regarding doing home drsg changes but also willing at the same time. Asks why HH is coming out if they are not going to come everyday, explained that insurance regardless of insurance type does not pay/cover for [facility] to come on a daily basis.  Mom confirmed she has starter box for colostomy care, will have bedside RN give mom some drsg change supplies to have until [facility] gets out for visit.   Discussed with mom transportation needs, mom came to hospital via bus and wants to use "hospital transport" home.  Per mom she needs to go home and get patient some clothes, offered pt/mom clothes from "clothes closet or paper scrubs" mom declined and states she will go home and return with clothes by 5pm.  TOC will assist patient home with transportation- Cab voucher will be provided for pt and mom to transport home- mom will need to ride with patient as support person as patient is unable to navigate transportation on his own.   Call made to [facility] with [facility]- confirmed scheduler has been in touch with mom regarding HHRN. Discussed with [facility] needs for f/u with mom regarding drsg changes and colostomy care. Medi will also f/u with mom to make sure she has called the [facility] program regarding ostomy supplies for home. They are  planning on doing visit in the home on Sunday.   Once mom returns, bedside RN can call for cab transport.     Final next level of care: [place] Barriers to Discharge: Barriers Resolved   Patient Goals and CMS Choice Patient states their goals for this hospitalization and ongoing recovery are:: return home CMS Medicare.gov Compare Post Acute Care list provided to:: Patient Choice offered to / list presented to : Parent  Discharge Placement               Home w/ [facility]        Discharge Plan and Services   Discharge Planning Services: CM Consult Post Acute Care Choice: Home Health          DME Arranged: N/A DME Agency: NA       HH Arranged: RN [place] Agency: [place] Date [facility] Agency Contacted: 08/22/21 Time [place]: [id number] Representative spoke with at [place]: [place] ([place]) Interventions     Readmission Risk Interventions Readmission Risk Prevention Plan 08/22/2021  Transportation Screening Complete  PCP or Specialist Appt within 5-7 Days Complete  Home Care Screening Complete  Medication Review (RN CM) Complete  Some recent data might be hidden

## 2021-08-22 NOTE — Progress Notes (Signed)
14 Days Post-Op  Subjective: Doing well with diet. Continues to have flatus and stool via ostomy. Pain well controlled. No nausea today  Objective: Vital signs in last 24 hours: Temp:  [97.9 F (36.6 C)-99.1 F (37.3 C)] 98.4 F (36.9 C) (01/20 0324) Pulse Rate:  [77-98] 90 (01/20 0324) Resp:  [13-19] 17 (01/20 0324) BP: (96-109)/(70-74) 101/74 (01/20 0324) SpO2:  [98 %-100 %] 100 % (01/20 0324) Last BM Date: 08/21/21  Intake/Output from previous day: 01/19 0701 - 01/20 0700 In: 948.3 [P.O.:587; I.V.:361.3] Out: 1390 [Urine:1180; Drains:10; Stool:200] Intake/Output this shift: No intake/output data recorded.  PE: Heart: regular Lungs: CTAB Abd: soft, midline wound with beefy red grannulation tissue and small amount of bleeding - no purulence, erythema or induration. Colostomy with gas and soft stool JP drain site with dressing c/d/i  Lab Results:  No results for input(s): WBC, HGB, HCT, PLT in the last 72 hours.  BMET No results for input(s): NA, K, CL, CO2, GLUCOSE, BUN, CREATININE, CALCIUM in the last 72 hours.  PT/INR No results for input(s): LABPROT, INR in the last 72 hours. CMP     Component Value Date/Time   NA 136 08/19/2021 0500   K 3.9 08/19/2021 0500   CL 106 08/19/2021 0500   CO2 25 08/19/2021 0500   GLUCOSE 126 (H) 08/19/2021 0500   BUN 12 08/19/2021 0500   CREATININE 0.72 08/19/2021 0500   CALCIUM 8.2 (L) 08/19/2021 0500   PROT 6.2 (L) 08/18/2021 0455   ALBUMIN 2.4 (L) 08/18/2021 0455   AST 30 08/18/2021 0455   ALT 49 (H) 08/18/2021 0455   ALKPHOS 109 08/18/2021 0455   BILITOT 0.5 08/18/2021 0455   GFRNONAA >60 08/19/2021 0500   GFRAA >60 04/01/2020 1237   Lipase     Component Value Date/Time   LIPASE 22 08/06/2021 1922       Studies/Results: No results found.  Anti-infectives: Anti-infectives (From admission, onward)    Start     Dose/Rate Route Frequency Ordered Stop   08/11/21 1230  Ampicillin-Sulbactam (UNASYN) 3 g in  sodium chloride 0.9 % 100 mL IVPB        3 g 200 mL/hr over 30 Minutes Intravenous Every 6 hours 08/11/21 1135 08/17/21 1127   08/09/21 1600  ceFAZolin (ANCEF) IVPB 2g/100 mL premix  Status:  Discontinued        2 g 200 mL/hr over 30 Minutes Intravenous Every 8 hours 08/09/21 1501 08/11/21 1135   08/07/21 0600  piperacillin-tazobactam (ZOSYN) IVPB 3.375 g  Status:  Discontinued        3.375 g 12.5 mL/hr over 240 Minutes Intravenous Every 8 hours 08/07/21 0544 08/09/21 1501   08/06/21 2100  piperacillin-tazobactam (ZOSYN) IVPB 3.375 g        3.375 g 12.5 mL/hr over 240 Minutes Intravenous  Once 08/06/21 2057 08/07/21 0900        Assessment/Plan POD 14, 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 Burr Medico for outpatient follow up -CEA  normal -CT chest 1/13 - no evidence of thoracic metastasis - Tolerating soft diet, appetite improving. Good bowel function. Bowel regimen - daily docusate, daily senokot, bid miralax. ambulate -HH RN orders placed to see if we can get this arranged for him. -abx therapy for E. Coli bacteremia, sensitive to ancef/unasyn completed -cont BID dressing changes -WBC down trending at 12.5 1/14,  AF -IS/pulm toilet -multi-modal pain control - JP drain removed 1/19 - dc PICC  - mobilize. No PT/OT follow up recommended  -WOC help appreciated for new colostomy, mother will need a lot of teaching prior to discharge Myself and Dr. Rosendo Gros had long discussion with mom and patient's cousin 1/18 in regard to patient care and disposition planning. She is hesitant but willing to learn colostomy care.  FEN - soft diet, SLIV VTE - Lovenox ID - zosyn, ancef, unasyn 1/9>>1/15 therapy for bacteremia completed  Schizophrenia - medications have been ordered  Dispo: TOC arranging [place]. Mom will need to be taught  colostomy wound care. Medically stable for discharge   LOS: 15 days   Winferd Humphrey, [facility] 08/22/2021, 7:43 AM Please see Amion for pager number during day hours 7:00am-4:30pm

## 2021-08-22 NOTE — Consult Note (Addendum)
[facility] Nurse ostomy follow up Patient receiving care in [facility] 4E12 Stoma type/location: LMQ colostomy Stomal assessment/size: pink/brown with sutures in place Peristomal assessment: intact Treatment options for stomal/peristomal skin: Skin barrier wipes and barrier rings Output: mushy brown with gas in pouch Ostomy pouching: 1pc. Flexible convex Education provided: Mom was present for teaching and education. Much more attentive for teaching today. She did not do the actual pouch change but did watch everything and I explained step by step letting gas out of the pouch, emptying the pouch, cleaning the opening and closing back up, measuring and cutting pouch to size, removing the pouch, looking through the window of the pouch for placement and cutting, cleaning around the stoma, placing the barrier ring, pulling the back from the pouch to stick onto the skin and around the stoma. Once the pouch was placed, I dated it as changed today and placed a date for her to change by. We discussed the Secure Start kit that she received at home. Calling to continue to get supplies. She called the [facility] agency while I was in the room and they told her they would call on Saturday evening to set up a time to come on Sunday. They will visit 3 times a week and review dressing changes and pouch changes at each visit. Before I left the room, I went over the entire pouch change process again with a clean pouch and the mom was able to tell me each step. I reminded her to change the pouch twice a week or anytime it leaks and empty when it gets 1/3 to 1/2 full and letting the gas out. Reminded her to take supplies left in the room home with her.  Spoke with Case Manager and bedside RN after leaving room and sent a [place] to [place] with [place].   I recommend/discussed the [facility] outpatient ostomy clinic as an outpatient resource.  IF MD agrees this would be beneficial, please fax referral, or enter electronically in Epic the referral. (Fax-  [phone])   WOC will sign off at this time but are available M-F if needed. No weekend coverage.  Cathlean Marseilles Tamala Julian, MSN, RN, [place], Lysle Pearl, [facility] Wound Treatment Associate Pager [phone]

## 2021-08-22 NOTE — TOC Progression Note (Signed)
Transition of Care [facility]) - Progression Note    Patient Details  Name: Matthew [name] MRN: [id number] Date of Birth: [birth year]  Transition of Care [facility]) CM/SW [place], [place] Phone Number: 08/22/2021, 3:05 PM  Clinical Narrative:     CSW provided taxi voucher for patient and his mother(patient can not travel alone)  Thurmond Butts, MSW, LCSW Clinical Social Worker    Expected Discharge Plan: [place] Barriers to Discharge: Barriers Resolved  Expected Discharge Plan and Services Expected Discharge Plan: [place]   Discharge Planning Services: CM Consult Post Acute Care Choice: [place] arrangements for the past 2 months: Single Family Home Expected Discharge Date: 08/22/21               DME Arranged: N/A DME Agency: NA       HH Arranged: RN [place] Agency: [place] Date HH Agency Contacted: 08/22/21 Time [place]: 1540 Representative spoke with at [place]: [place] ([place]) Interventions    Readmission Risk Interventions Readmission Risk Prevention Plan 08/22/2021  Transportation Screening Complete  PCP or Specialist Appt within 5-7 Days Complete  Home Care Screening Complete  Medication Review (RN CM) Complete  Some recent data might be hidden

## 2021-08-26 DIAGNOSIS — C186 Malignant neoplasm of descending colon: Secondary | ICD-10-CM | POA: Insufficient documentation

## 2021-08-27 ENCOUNTER — Encounter: Payer: Self-pay | Admitting: Hematology

## 2021-08-27 ENCOUNTER — Inpatient Hospital Stay: Payer: Medicaid Other | Attending: Hematology | Admitting: Hematology

## 2021-08-27 ENCOUNTER — Other Ambulatory Visit: Payer: Self-pay

## 2021-08-27 DIAGNOSIS — Z803 Family history of malignant neoplasm of breast: Secondary | ICD-10-CM | POA: Insufficient documentation

## 2021-08-27 DIAGNOSIS — F209 Schizophrenia, unspecified: Secondary | ICD-10-CM | POA: Insufficient documentation

## 2021-08-27 DIAGNOSIS — C779 Secondary and unspecified malignant neoplasm of lymph node, unspecified: Secondary | ICD-10-CM | POA: Insufficient documentation

## 2021-08-27 DIAGNOSIS — D649 Anemia, unspecified: Secondary | ICD-10-CM | POA: Diagnosis not present

## 2021-08-27 DIAGNOSIS — C186 Malignant neoplasm of descending colon: Secondary | ICD-10-CM | POA: Insufficient documentation

## 2021-08-27 DIAGNOSIS — D509 Iron deficiency anemia, unspecified: Secondary | ICD-10-CM | POA: Diagnosis not present

## 2021-08-27 DIAGNOSIS — Z79899 Other long term (current) drug therapy: Secondary | ICD-10-CM | POA: Diagnosis not present

## 2021-08-27 DIAGNOSIS — Z809 Family history of malignant neoplasm, unspecified: Secondary | ICD-10-CM

## 2021-08-27 DIAGNOSIS — Z9049 Acquired absence of other specified parts of digestive tract: Secondary | ICD-10-CM

## 2021-08-27 MED ORDER — FERROUS SULFATE 325 (65 FE) MG PO TBEC: 325.0000 mg | DELAYED_RELEASE_TABLET | Freq: Every day | ORAL | 3 refills | Status: DC

## 2021-08-27 NOTE — Progress Notes (Signed)
Left message for patient's mother, Lorna Dibble Hcks reminding her of [place] appt this afternoon with Dr Burr Medico.  I left my call back number.

## 2021-08-27 NOTE — Progress Notes (Signed)
START ON PATHWAY REGIMEN - Colorectal     A cycle is every 21 days:     Capecitabine      Oxaliplatin   **Always confirm dose/schedule in your pharmacy ordering system**  Patient Characteristics: Postoperative without Neoadjuvant Therapy (Pathologic Staging), Colon, Stage III, Low Risk (pT1-3, pN1) Tumor Location: Colon Therapeutic Status: Postoperative without Neoadjuvant Therapy (Pathologic Staging) AJCC M Category: cM0 AJCC T Category: pT3 AJCC N Category: pN1a AJCC 8 Stage Grouping: IIIB Intent of Therapy: Curative Intent, Discussed with Patient

## 2021-08-27 NOTE — Progress Notes (Signed)
[place]   Telephone:([phone]) [phone] Fax:(336) [phone]   Clinic New Consult Note   Patient Care Team: Medicine, [facility] as PCP - General Clovis Riley, MD as Consulting Physician (General Surgery)  Date of Service:  08/27/2021   CHIEF COMPLAINTS/PURPOSE OF CONSULTATION:  Colon cancer  REFERRING PHYSICIAN:  Dr. Windle Guard  ASSESSMENT & PLAN:  Matthew [name] is a 56 y.o.  male with   1. Cancer of left colon, stage IIIB p(T3, N1aM0), MSS -presented to ED on 08/06/21 with recurrent upper abdominal pain. CT AP showed sigmoid colon perforation, with possible underlying malignancy.  This was complicated with abdominal abscess, which required surgery and IV antibiotics.  -emergent partial colectomy on 08/08/21 by Dr. Windle Guard showed invasive moderately differentiated adenocarcinoma. Margins negative, one lymph node showed metastatic carcinoma. -I reviewed the pathology and work up thus far with pt and his mother today.  Patient does not seem to be able to understand well, his mother voiced good understanding. -Staging CT scan was negative for metastatic disease. -Due to the stage IIIb disease, he has high risk for recurrence after surgery.  I recommend adjuvant chemotherapy to reduce his risk of recurrence. -I discussed the option of FOLFOX or Capox. I recommend CAPOX every 3 weeks for 4 cycles (3 months) --Chemotherapy consent: Side effects including but does not not limited to, fatigue, nausea, vomiting, diarrhea, hair loss, cold sensitivity and neuropathy, fluid retention, renal and kidney dysfunction, neutropenic fever, needed for blood transfusion, bleeding, were discussed with patient in great detail. She agrees to proceed. -Because of his open wound, I will not start any kind of treatment until it is healed. I will check back in with them in 2 weeks to see how he is healing. -do no plan to place a port   2. Anemia, likely iron deficient -chronic prior to  diagnosis -worsened since surgery, in 9 range. -I recommended he take oral iron; I prescribed for him today. -will check iron study on next lab   3.  Schizophrenia, disabled, social support -he was diagnosed with schizophrenia in his 20's, managed with medication, he is on disability. -he lives with his mother; he is able to do ADLs for himself, but not iADLS. They are both on disability. His mother is also a poor historian. -he has a home care nurse that comes in. His mother seems to have difficulty caring for his wound on her own.   PLAN:  -I called in ferrous sulfate for him to start -chemo education class, lab and f/u in 2-3 weeks to determine his chemo start date    Oncology History Overview Note   Cancer Staging  Cancer of left colon [facility]) Staging form: Colon and Rectum, AJCC 8th Edition - Pathologic stage from 08/08/2021: Stage IIIB (pT3, pN1a, cM0) - Signed by Truitt Merle, MD on 08/26/2021    Cancer of left colon ([place])  04/01/2020 Imaging   IMPRESSION: 1. Gallbladder decompressed bowel also partially calcified gallstones. No pericholecystic inflammation though if there is persisting clinical concern for cholecystitis right upper quadrant ultrasound could be obtained. 2. Circumferential thickening of the distal thoracic esophagus. Could reflect features of esophagitis. Correlate with clinical symptoms and consider endoscopy as clinically warranted. 3. Additional segmental thickening of the mid to distal sigmoid with focal narrowing. No acute surrounding inflammation or resulting obstruction. Findings are nonspecific, and could reflect sequela of prior inflammation/infection. However, recommend correlation with colonoscopy if not recently performed. 4. Mild circumferential bladder wall thickening and indentation  of the bladder base by an enlarged prostate. Possibly sequela of chronic outlet obstruction though could correlate with urinalysis to exclude cystitis. 5. Aortic  Atherosclerosis (ICD10-I70.0).   08/06/2021 Imaging   IMPRESSION: Sigmoid colonic perforation with small free intraperitoneal gas and infiltration of the mesenteric and omental fat in keeping with changes of peritonitis.   Long segment inflammatory stranding of the sigmoid colon in keeping with a severe infectious or inflammatory colitis. This terminates an area of irregular mural thickening, infiltrative soft tissue within the colonic mesentery, and focal dystrophic calcification. This may represent a chronic inflammatory process, however, a perforated malignancy could appear similarly. There are 2 separate points of perforation which again raise the question of an underlying malignancy.   7.1 cm gas and fluid containing pericolonic abscess within the sigmoid mesentery.   Marked inflammatory change of the terminal ileum adjacent to the pericolonic abscess with resultant small bowel obstruction. Fluid within the distal esophagus likely relates to gastroesophageal reflux the setting of vomiting.   Aortic Atherosclerosis (ICD10-I70.0).   08/08/2021 Cancer Staging   Staging form: Colon and Rectum, AJCC 8th Edition - Pathologic stage from 08/08/2021: Stage IIIB (pT3, pN1a, cM0) - Signed by Truitt Merle, MD on 08/26/2021 Stage prefix: Initial diagnosis Total positive nodes: 1 Histologic grading system: 4 grade system Histologic grade (G): G2 Residual tumor (R): R0 - None    08/08/2021 Definitive Surgery   FINAL MICROSCOPIC DIAGNOSIS:   A. COLON, SIGMOID, PARTIAL COLECTOMY:  - Invasive moderately differentiated adenocarcinoma.  - Metastatic carcinoma involving one of twelve lymph nodes (1/12).  - See oncology table below.   ADDENDUM:  Mismatch Repair Protein ([facility])  SUMMARY INTERPRETATION: NORMAL    08/14/2021 Imaging   EXAM: CT ABDOMEN AND PELVIS WITH CONTRAST  IMPRESSION: 1. Post recent sigmoid colectomy with left lower quadrant colostomy. Two small residual foci of air within the pelvic  mesentery with mild adjacent thickening, but no abscess or drainable collection. Trace non organized free fluid and stranding in the pelvis. 2. Short segment of small bowel wall thickening and inflammation in the pelvis involving the distal ileum, likely reactive. 3. Dilated distal esophagus, stomach, and small bowel, without discrete transition point, favoring postoperative ileus. 4. Small bilateral pleural effusions and compressive atelectasis. 5. Heterogeneous partially enhancing 14 mm lymph node in the retroperitoneum at the aortoiliac bifurcation, not significantly changed from prior exam. Suspected additional lymph nodes in the anterior common iliac space, not significantly changed from prior exam. Recommend attention at follow-up. 6. Additional chronic findings as described.     08/15/2021 Imaging   EXAM: CT CHEST WITH CONTRAST  IMPRESSION: 1. No evidence of thoracic metastasis. 2. Bilateral small layering pleural effusions with passive atelectasis   08/26/2021 Initial Diagnosis   Cancer of left colon ([place])   09/22/2021 -  Chemotherapy   Patient is on Treatment Plan : COLORECTAL Xelox (Capeox) q21d        HISTORY OF PRESENTING ILLNESS:  Matthew [name] 56 y.o. male is a here because of colon cancer. The patient was referred by Dr. Windle Guard. The patient presents to the clinic today accompanied by his mother.   He was initially evaluated in ED on 04/01/20 for abdominal pain. CT AP performed at that time showed: findings concerning for cholecystitis; thickening of distal thoracic esophagus; nonspecific thickening of sigmoid. Colonoscopy was recommended, and referral was placed to [facility] GI and general surgery. His mother is unsure if he had an appointment with anyone.  He presented back to the ED on 08/06/21  with recurrent abdominal pain, as well as nausea and vomiting. He underwent CT AP showing sigmoid colonic perforation, at two separate points raising question of an underlying  malignancy. He was evaluated by surgery and proceeded with emergent exploratory laparotomy and partial colectomy with colostomy on 1/6//23 under Dr. Windle Guard. Pathology from the procedure revealed invasive moderately differentiated adenocarcinoma, measuring 3 cm. One of 12 lymph nodes showed metastatic carcinoma.   Today the patient notes they felt/feeling prior/after... -history is somewhat limited-- his mother is a poor historian, and patient doesn't talk much.  He has a PMHx of.... -schizophrenia, dx in his 77's -no prior surgeries or other medical conditions  Socially... -he is currently living with his mother; they are both on disability. She states he can't take care of himself on his own. -she reports he has a cousin with breast cancer, and she reports she has an aunt with cancer (type unknown).    REVIEW OF SYSTEMS:    Assessment limited due to patient's mental handicap.   MEDICAL HISTORY:  Past Medical History:  Diagnosis Date   Schizophrenia ([place])     SURGICAL HISTORY: Past Surgical History:  Procedure Laterality Date   LAPAROTOMY N/A 08/08/2021   Procedure: EXPLORATORY LAPAROTOMY;  Surgeon: Clovis Riley, MD;  Location: [place];  Service: General;  Laterality: N/A;   PARTIAL COLECTOMY N/A 08/08/2021   Procedure: PARTIAL COLECTOMY WITH COLOSTOMY;  Surgeon: Clovis Riley, MD;  Location: [facility] OR;  Service: General;  Laterality: N/A;    SOCIAL HISTORY: Social History   Socioeconomic History   Marital status: Single    Spouse name: Not on file   Number of children: 0   Years of education: Not on file   Highest education level: Not on file  Occupational History   Not on file  Tobacco Use   Smoking status: Never   Smokeless tobacco: Not on file  Substance and Sexual Activity   Alcohol use: No   Drug use: Not on file   Sexual activity: Not on file  Other Topics Concern   Not on file  Social History Narrative   Not on file   Social Determinants of Health    Financial Resource Strain: Not on file  Food Insecurity: Not on file  Transportation Needs: Not on file  Physical Activity: Not on file  Stress: Not on file  Social Connections: Not on file  Intimate Partner Violence: Not on file    FAMILY HISTORY: Family History  Problem Relation Age of Onset   Diabetes Father    Heart attack Father    Cancer Other        unknown type cancer   Cancer Cousin        breast cancer   Colon cancer Neg Hx     ALLERGIES:  has No Known Allergies.  MEDICATIONS:  Current Outpatient Medications  Medication Sig Dispense Refill   ferrous sulfate 325 (65 FE) MG EC tablet Take 1 tablet (325 mg total) by mouth daily. 30 tablet 3   acetaminophen (TYLENOL) 500 MG tablet Take 2 tablets (1,000 mg total) by mouth every 6 (six) hours as needed for mild pain or moderate pain.  0   aspirin EC 81 MG tablet Take 81 mg by mouth daily as needed (for pain or headaches).      benztropine (COGENTIN) 1 MG tablet Take 1 mg at bedtime by mouth.     cetirizine (ZYRTEC) 10 MG tablet Take 10 mg by mouth daily as needed  for allergies.     docusate sodium (COLACE) 100 MG capsule Take 1 capsule (100 mg total) by mouth 2 (two) times daily for 5 days. Continue taking until having regular stool output in colostomy. At that point can take as needed for constipation 10 capsule 0   Multiple Vitamin (MULTIVITAMIN WITH MINERALS) TABS tablet Take 1 tablet by mouth daily.     paliperidone (INVEGA SUSTENNA) 156 MG/ML SUSP injection Inject 156 mg every 30 (thirty) days into the muscle.     polyethylene glycol (MIRALAX / GLYCOLAX) 17 g packet Take 17 g by mouth daily as needed for mild constipation or moderate constipation.  0   No current facility-administered medications for this visit.    PHYSICAL EXAMINATION: ECOG PERFORMANCE STATUS: 2 - Symptomatic, <50% confined to bed  Vitals:   08/27/21 1457  BP: 117/78  Pulse: (!) 113  Resp: 18  Temp: 98.9 F (37.2 C)  SpO2: 100%   Filed  Weights   08/27/21 1457  Weight: 132 lb 1.6 oz (59.9 kg)    GENERAL:alert, no distress and comfortable SKIN: skin color, texture, turgor are normal, no rashes or significant lesions EYES: normal, Conjunctiva are pink and non-injected, sclera clear  NECK: supple, thyroid normal size, non-tender, without nodularity LYMPH:  no palpable lymphadenopathy in the cervical, axillary  LUNG: Clear to auscultation, no rales or wheezing ABDOMEN: (+) midline incision still open, with gauze in the open wound, clean NEURO: alert & oriented x 3 with fluent speech, no focal motor/sensory deficits  LABORATORY DATA:  I have reviewed the data as listed CBC Latest Ref Rng & Units 08/16/2021 08/15/2021 08/14/2021  WBC 4.0 - 10.5 K/uL 12.5(H) 12.7(H) 13.6(H)  Hemoglobin 13.0 - 17.0 g/dL 9.1(L) 9.4(L) 9.8(L)  Hematocrit 39.0 - 52.0 % 28.0(L) 29.1(L) 30.2(L)  Platelets 150 - 400 K/uL 647(H) 628(H) 637(H)    CMP Latest Ref Rng & Units 08/19/2021 08/18/2021 08/16/2021  Glucose 70 - 99 mg/dL 126(H) 135(H) 119(H)  BUN 6 - 20 mg/dL 12 12 11   Creatinine 0.61 - 1.24 mg/dL 0.72 0.75 0.68  Sodium 135 - 145 mmol/L 136 136 137  Potassium 3.5 - 5.1 mmol/L 3.9 4.2 4.2  Chloride 98 - 111 mmol/L 106 106 105  CO2 22 - 32 mmol/L 25 23 23   Calcium 8.9 - 10.3 mg/dL 8.2(L) 8.6(L) 8.2(L)  Total Protein 6.5 - 8.1 g/dL - 6.2(L) -  Total Bilirubin 0.3 - 1.2 mg/dL - 0.5 -  Alkaline Phos 38 - 126 U/L - 109 -  AST 15 - 41 U/L - 30 -  ALT 0 - 44 U/L - 49(H) -     RADIOGRAPHIC STUDIES: I have personally reviewed the radiological images as listed and agreed with the findings in the report. CT CHEST W CONTRAST  Result Date: 08/15/2021 CLINICAL DATA:  Colon cancer, metastatic staging EXAM: CT CHEST WITH CONTRAST TECHNIQUE: Multidetector CT imaging of the chest was performed during intravenous contrast administration. RADIATION DOSE REDUCTION: This exam was performed according to the departmental dose-optimization program which includes  automated exposure control, adjustment of the mA and/or kV according to patient size and/or use of iterative reconstruction technique. CONTRAST:  31m OMNIPAQUE IOHEXOL 300 MG/ML  SOLN COMPARISON:  CT abdomen pelvis 08/14/2021 FINDINGS: Cardiovascular: No significant vascular findings. Normal heart size. No pericardial effusion. PICC line extends into the distal SVC Mediastinum/Nodes: NG tube esophagus. Trachea normal. No mediastinal adenopathy. No axillary or supraclavicular adenopathy. Lungs/Pleura: Bilateral layering pleural effusions. Mild bibasilar passive atelectasis. No suspicious pulmonary nodules.  Upper Abdomen: Limited view of the liver, kidneys, pancreas are unremarkable. Normal adrenal glands. Musculoskeletal: No aggressive osseous lesion. IMPRESSION: 1. No evidence of thoracic metastasis. 2. Bilateral small layering pleural effusions with passive atelectasis Electronically Signed   By: Suzy Bouchard M.D.   On: 08/15/2021 12:40   CT ABDOMEN PELVIS W CONTRAST  Result Date: 08/14/2021 CLINICAL DATA:  Postop post Hartmann's for perforated colon cancer, question abscess. Abdominal pain. EXAM: CT ABDOMEN AND PELVIS WITH CONTRAST TECHNIQUE: Multidetector CT imaging of the abdomen and pelvis was performed using the standard protocol following bolus administration of intravenous contrast. CONTRAST:  66m OMNIPAQUE IOHEXOL 350 MG/ML SOLN COMPARISON:  Preoperative CT 08/06/2021 FINDINGS: Lower chest: Small bilateral pleural effusions and compressive atelectasis. Heart is normal in size. Dilated fluid-filled distal esophagus with enteric tube in place. Hepatobiliary: Tiny subcentimeter hypodensity in the right hepatic lobe, series 3, image 11. No other focal liver abnormality. Wall calcification of the gallbladder fundus, gallbladder partially distended. No pericholecystic inflammation or visualized gallstones. No biliary dilatation. Pancreas: No ductal dilatation or inflammation. Spleen: Normal in size  without focal abnormality. Adrenals/Urinary Tract: No adrenal nodule. No hydronephrosis or perinephric edema. Homogeneous renal enhancement with symmetric excretion on delayed phase imaging. Left renal cyst as well as low-density lesion in the right kidney that is too small to characterize. Urinary bladder is physiologically distended without wall thickening. Focus of air in the nondependent bladder is likely related to recent Foley catheter placement. Stomach/Bowel: Enteric tube tip in the stomach. The stomach is dilated with air, contrast mixed with enteric contents. There is no gastric wall thickening. Mildly dilated contrast filled small bowel with no bowel diameter measuring up to 4.1 cm. Administered contrast reaches the mid small bowel. More distal small bowel are mildly dilated and fluid-filled. Short segment of small bowel wall thickening and inflammation in the pelvis involving the distal ileum, series 3, image 64. No discrete transition point. Stapled off sigmoid colon in the pelvis. Fluid/liquid stool in the cecum and ascending colon. There is formed stool in the hepatic flexure, transverse, and proximal descending colon. Nondistention versus wall thickening of the distal descending colon, with left lower quadrant colostomy. No peristomal hernia. Vascular/Lymphatic: Mild aortic atherosclerosis. Heterogeneous partially enhancing 14 mm noted in the retroperitoneum at the aortoiliac bifurcation, series 3, image 50. This is not significantly changed from prior exam. Suspected additional lymph nodes in the anterior common iliac space, series 3, image 54. There is scattered occasional mesenteric lymph nodes are not enlarged by size criteria. Reproductive: Prostate is unremarkable. Other: Small amount of non organized free fluid in the pelvis without peripheral enhancement. Mild generalized stranding in the pelvis adjacent to surgical sutures. Surgical drain entering in the right abdomen courses adjacent to  small bowel, loops into the pelvis with tip terminating in the central mid pelvis. There is no fluid collection or fluid along the course of the drain. Two small residual foci of air within the pelvic mesentery with mild adjacent thickening, series 3 images 57 and 62, but no drainable collection. Single bubble of gas persists under the left lower quadrant ostomy. Post laparotomy. Open abdominal wound. No subcutaneous collection. Musculoskeletal: No focal bone lesion or acute osseous abnormality. IMPRESSION: 1. Post recent sigmoid colectomy with left lower quadrant colostomy. Two small residual foci of air within the pelvic mesentery with mild adjacent thickening, but no abscess or drainable collection. Trace non organized free fluid and stranding in the pelvis. 2. Short segment of small bowel wall thickening and inflammation in  the pelvis involving the distal ileum, likely reactive. 3. Dilated distal esophagus, stomach, and small bowel, without discrete transition point, favoring postoperative ileus. 4. Small bilateral pleural effusions and compressive atelectasis. 5. Heterogeneous partially enhancing 14 mm lymph node in the retroperitoneum at the aortoiliac bifurcation, not significantly changed from prior exam. Suspected additional lymph nodes in the anterior common iliac space, not significantly changed from prior exam. Recommend attention at follow-up. 6. Additional chronic findings as described. Aortic Atherosclerosis (ICD10-I70.0). Electronically Signed   By: Keith Rake M.D.   On: 08/14/2021 19:56   CT ABDOMEN PELVIS W CONTRAST  Result Date: 08/07/2021 CLINICAL DATA:  Left lower quadrant abdominal pain, nausea, vomiting EXAM: CT ABDOMEN AND PELVIS WITH CONTRAST TECHNIQUE: Multidetector CT imaging of the abdomen and pelvis was performed using the standard protocol following bolus administration of intravenous contrast. CONTRAST:  42m OMNIPAQUE IOHEXOL 350 MG/ML SOLN COMPARISON:  None. FINDINGS: Lower  chest: The visualized lung bases are clear. Moderate coronary artery calcification. There is distention of the distal esophagus with fluid which likely relates to gastroesophageal reflux given the patient's history of vomiting. Hepatobiliary: Mural calcification of the gallbladder is noted. No associated mass. The gallbladder is not distended. The liver is unremarkable. No intra or extrahepatic biliary ductal dilation. Pancreas: Unremarkable Spleen: Unremarkable Adrenals/Urinary Tract: The adrenal glands are unremarkable. The kidneys are normal in size and position. Simple cortical cysts are seen bilaterally. The kidneys are otherwise unremarkable. The bladder is unremarkable. Stomach/Bowel: There is long segment marked circumferential thickening and pericolonic inflammatory of the sigmoid colon. Within the mid sigmoid colon, the inflammatory changes abruptly terminate adjacent to several mural calcifications and infiltrative soft tissue within the adjacent retroperitoneum. This appears progressive since prior examination. The sigmoid colon demonstrates focal perforation in this location with a small focus of extraluminal gas. There is a second site of perforation, slightly distally which appears to track to a contained gas and fluid collection within the colonic mesentery measuring 7.1 x 4.1 cm on coronal image # 43/7. The 2 perforations are best appreciated on coronal image # 52/7. There is mild free intraperitoneal gas noted within the abdomen. The terminal ileum is seen immediately adjacent to the sigmoid colonic mesenteric collection and demonstrates marked wall thickening and hyperemia, likely related to the adjacent inflammatory process. There is a resultant distal small bowel obstruction with multiple dilated fluid-filled loops of small bowel proximally. Small free fluid within the pelvis. Infiltration within the mesentery and omentum is in keeping with changes of peritonitis. Vascular/Lymphatic: There is a  a pathologically enlarged lymph node within the aortocaval lymph node group measuring 14.5 cm in diameter mildly enlarged since prior examination. No additional pathologic adenopathy within the abdomen and pelvis. Mild aortoiliac atherosclerotic calcification. The abdominal vasculature is otherwise unremarkable. Reproductive: Mild prostatic enlargement. Other: Small broad-based fat containing umbilical hernia. Musculoskeletal: No acute bone abnormality. IMPRESSION: Sigmoid colonic perforation with small free intraperitoneal gas and infiltration of the mesenteric and omental fat in keeping with changes of peritonitis. Long segment inflammatory stranding of the sigmoid colon in keeping with a severe infectious or inflammatory colitis. This terminates an area of irregular mural thickening, infiltrative soft tissue within the colonic mesentery, and focal dystrophic calcification. This may represent a chronic inflammatory process, however, a perforated malignancy could appear similarly. There are 2 separate points of perforation which again raise the question of an underlying malignancy. 7.1 cm gas and fluid containing pericolonic abscess within the sigmoid mesentery. Marked inflammatory change of the terminal ileum adjacent to  the pericolonic abscess with resultant small bowel obstruction. Fluid within the distal esophagus likely relates to gastroesophageal reflux the setting of vomiting. Aortic Atherosclerosis (ICD10-I70.0). Electronically Signed   By: Fidela Salisbury M.D.   On: 08/07/2021 00:15   DG CHEST PORT 1 VIEW  Result Date: 08/07/2021 CLINICAL DATA:  Preop exam. Schizophrenia. Nasogastric tube placement. EXAM: PORTABLE CHEST 1 VIEW COMPARISON:  Earlier today FINDINGS: The nasogastric tube has been retracted. The tip of the tube is 1 cm below the carina. Stable cardiomediastinal contours. No pleural effusion or edema. No airspace opacities identified. IMPRESSION: 1. No acute cardiopulmonary abnormalities. 2.  The NG tube has been retracted and the tip is in the mid esophagus approximately 1 cm below the level of the carina. Electronically Signed   By: Kerby Moors M.D.   On: 08/07/2021 14:27   DG Abd Portable 1V  Result Date: 08/14/2021 CLINICAL DATA:  NG tube placement EXAM: PORTABLE ABDOMEN - 1 VIEW COMPARISON:  Radiographs dated August 14, 2021 at 9:38 a.m. FINDINGS: Multiple dilated small bowel loops concerning for ileus or obstruction. NG tube with distal tip about the gastric pylorus. No radio-opaque calculi or other significant radiographic abnormality are seen. IMPRESSION: Satisfactory positioning of the NG tube. Electronically Signed   By: Keane Police D.O.   On: 08/14/2021 15:19   DG Abd Portable 1V  Result Date: 08/14/2021 CLINICAL DATA:  Follow-up probable ileus. Status post laparotomy and partial colectomy on 08/08/2021 EXAM: PORTABLE ABDOMEN - 1 VIEW COMPARISON:  08/13/2021 FINDINGS: Nasogastric tube tip in the distal stomach in the region of the pylorus. Increase in caliber of multiple dilated, gas-filled small bowel loops. Normal caliber distal small bowel in the pelvis. Left lower quadrant ostomy. Stable drainage tube overlying the pelvis and inferior abdomen. Unremarkable bones. IMPRESSION: Progressive small bowel dilatation, concerning for partial obstruction. Electronically Signed   By: Claudie Revering M.D.   On: 08/14/2021 10:27   DG Abd Portable 1V  Result Date: 08/13/2021 CLINICAL DATA:  Ileus EXAM: PORTABLE ABDOMEN - 1 VIEW COMPARISON:  Radiograph dated August 07, 2021 FINDINGS: NG/OG tube tip and side port project over the distal stomach. Drain overlying the lower abdomen. Numerous dilated loops of small and large bowel again seen. IMPRESSION: Numerous dilated loops of small and large bowel are similar to prior exam and likely due to ileus. Electronically Signed   By: Yetta Glassman M.D.   On: 08/13/2021 09:49   DG Abd Portable 1V  Result Date: 08/07/2021 CLINICAL DATA:   Nasogastric tube placement EXAM: PORTABLE ABDOMEN - 1 VIEW COMPARISON:  None. FINDINGS: Nasogastric tube tip overlies the proximal body of the stomach. Visualized abdominal gas pattern is nonobstructive. No gross free intraperitoneal gas. IMPRESSION: Nasogastric tube tip within the proximal body of the stomach. Electronically Signed   By: Fidela Salisbury M.D.   On: 08/07/2021 02:22   [place] EKG SITE RITE  Result Date: 08/12/2021 If Site Rite image not attached, placement could not be confirmed due to current cardiac rhythm.    No orders of the defined types were placed in this encounter.   All questions were answered. The patient knows to call the clinic with any problems, questions or concerns. The total time spent in the appointment was 60 minutes.     Truitt Merle, MD 08/27/2021 5:21 PM  I, Wilburn Mylar, am acting as scribe for Truitt Merle, MD.   I have reviewed the above documentation for accuracy and completeness, and I agree with the above.

## 2021-08-28 DIAGNOSIS — C186 Malignant neoplasm of descending colon: Secondary | ICD-10-CM

## 2021-08-28 NOTE — Progress Notes (Signed)
Pt registered in [place] for [facility] transportation services.

## 2021-08-28 NOTE — Progress Notes (Signed)
Late entry: I met with Matthew [name] and his Mother after their consultation with Dr Burr Medico.  I explained my role as a nurse navigator and provided my contact information.  Referral placed for social services for community resources.

## 2021-08-29 ENCOUNTER — Encounter: Payer: Self-pay | Admitting: *Deleted

## 2021-08-29 NOTE — Progress Notes (Signed)
[place] CSW Progress Notes  Social Work Intern attempted to contact patient by phone per referral from [employer]. Intern left a voicemail offering support and encouraging patient to return call. Intern also called pt's mother, Coltan Spinello, and there was no answer and no voicemail set up so intern could not leave a message.  Intern will follow up by phone next week, and try to see pt in person at next appointment.  Rosary Lively, Social Work Intern Supervised by Gwinda Maine, LCSW

## 2021-09-03 ENCOUNTER — Other Ambulatory Visit: Payer: Self-pay

## 2021-09-03 ENCOUNTER — Encounter: Payer: Self-pay | Admitting: *Deleted

## 2021-09-03 NOTE — Progress Notes (Signed)
[place] CSW Progress Notes   Social Work Intern made 2nd attempt to contact patient by phone per referral from [employer]. Intern left a voicemail with contact information, offering support and encouraging patient to return call. Intern also called pt's mother, Olman Yono, and there was no answer and no voicemail set up so intern could not leave a message.   Intern will follow up again by phone later this week, and try to see pt in person at next appointment on 09/15/21.   Rosary Lively, Social Work Intern Supervised by Gwinda Maine, LCSW

## 2021-09-03 NOTE — Progress Notes (Signed)
The proposed treatment discussed in conference is for discussion purpose only and is not a binding recommendation.  The patients have not been physically examined, or presented with their treatment options.  Therefore, final treatment plans cannot be decided.  

## 2021-09-03 NOTE — Progress Notes (Signed)
Pt's mother called and LVM stating pt needs medication but did not indicate what medication on voicemail.  Returned called but was unsuccessful in reaching pt.  LVM for pt to return call stating name, DOB, and which medicine/medication his mom is referencing in her previous voicemail.  Awaiting pt's return call.

## 2021-09-15 ENCOUNTER — Telehealth: Payer: Self-pay | Admitting: Hematology

## 2021-09-15 ENCOUNTER — Inpatient Hospital Stay: Payer: Medicaid Other | Admitting: Hematology

## 2021-09-15 ENCOUNTER — Inpatient Hospital Stay: Payer: Medicaid Other | Attending: Hematology

## 2021-09-15 ENCOUNTER — Telehealth: Payer: Self-pay | Admitting: *Deleted

## 2021-09-15 DIAGNOSIS — C779 Secondary and unspecified malignant neoplasm of lymph node, unspecified: Secondary | ICD-10-CM | POA: Insufficient documentation

## 2021-09-15 DIAGNOSIS — C186 Malignant neoplasm of descending colon: Secondary | ICD-10-CM

## 2021-09-15 DIAGNOSIS — Z9049 Acquired absence of other specified parts of digestive tract: Secondary | ICD-10-CM | POA: Insufficient documentation

## 2021-09-15 DIAGNOSIS — F209 Schizophrenia, unspecified: Secondary | ICD-10-CM | POA: Insufficient documentation

## 2021-09-15 DIAGNOSIS — D509 Iron deficiency anemia, unspecified: Secondary | ICD-10-CM | POA: Insufficient documentation

## 2021-09-15 NOTE — Telephone Encounter (Signed)
Called pt's home # & spoke with mother, Lorna Dibble & she didn't remember appt today.  Informed that scheduling would call her back to r/s.  She states he has some other appts to coordinate with.  Message to scheduling done.

## 2021-09-15 NOTE — Telephone Encounter (Signed)
Sch per 2/13 inbasket, pt mother aware

## 2021-09-24 ENCOUNTER — Other Ambulatory Visit (HOSPITAL_COMMUNITY): Payer: Self-pay

## 2021-09-24 ENCOUNTER — Inpatient Hospital Stay: Payer: Medicaid Other

## 2021-09-24 ENCOUNTER — Encounter: Payer: Self-pay | Admitting: Hematology

## 2021-09-24 ENCOUNTER — Other Ambulatory Visit: Payer: Self-pay

## 2021-09-24 ENCOUNTER — Inpatient Hospital Stay (HOSPITAL_BASED_OUTPATIENT_CLINIC_OR_DEPARTMENT_OTHER): Payer: Medicaid Other | Admitting: Hematology

## 2021-09-24 VITALS — BP 97/76 | HR 109 | Temp 98.7°F | Resp 18 | Ht 69.0 in | Wt 131.6 lb

## 2021-09-24 DIAGNOSIS — C779 Secondary and unspecified malignant neoplasm of lymph node, unspecified: Secondary | ICD-10-CM | POA: Diagnosis not present

## 2021-09-24 DIAGNOSIS — Z9049 Acquired absence of other specified parts of digestive tract: Secondary | ICD-10-CM | POA: Diagnosis not present

## 2021-09-24 DIAGNOSIS — F209 Schizophrenia, unspecified: Secondary | ICD-10-CM | POA: Diagnosis not present

## 2021-09-24 DIAGNOSIS — C186 Malignant neoplasm of descending colon: Secondary | ICD-10-CM

## 2021-09-24 DIAGNOSIS — D509 Iron deficiency anemia, unspecified: Secondary | ICD-10-CM | POA: Diagnosis not present

## 2021-09-24 MED ORDER — ONDANSETRON HCL 8 MG PO TABS: 8.0000 mg | ORAL_TABLET | Freq: Two times a day (BID) | ORAL | 1 refills | Status: DC | PRN

## 2021-09-24 MED ORDER — PROCHLORPERAZINE MALEATE 10 MG PO TABS: 10.0000 mg | ORAL_TABLET | Freq: Four times a day (QID) | ORAL | 1 refills | Status: DC | PRN

## 2021-09-24 MED ORDER — CAPECITABINE 500 MG PO TABS
850.0000 mg/m2 | ORAL_TABLET | Freq: Two times a day (BID) | ORAL | 1 refills | Status: DC
  Filled 2021-09-24: qty 84, 14d supply, fill #0

## 2021-09-24 NOTE — Progress Notes (Signed)
[place]   Telephone:([phone]) [phone] Fax:([phone]) [phone]   Clinic Follow up Note   Patient Care Team: Medicine, [facility] as PCP - General Clovis Riley, MD as Consulting Physician (General Surgery) Truitt Merle, MD as Consulting Physician (Oncology) Royston Bake, RN as Oncology Nurse Navigator (Oncology)  Date of Service:  09/24/2021  CHIEF COMPLAINT: f/u of colon cancer  CURRENT THERAPY:  PENDING CAPOX  ASSESSMENT & PLAN:  Matthew [name] is a 56 y.o. male with   1. Cancer of left colon, stage IIIB p(T3, N1aM0), MSS -presented to ED on 08/06/21 with recurrent upper abdominal pain. CT AP showed sigmoid colon perforation, with possible underlying malignancy.  This was complicated with abdominal abscess, which required surgery and IV antibiotics.  -emergent partial colectomy on 08/08/21 by Dr. Windle Guard showed invasive moderately differentiated adenocarcinoma. Margins negative, one lymph node showed metastatic carcinoma (1/12). -Staging CT scan was negative for metastatic disease. -his colostomy wound is healing well, with a small area of shallow ulcer remaining. We will plan to start CAPOX in 1-2 weeks. -they are scheduled to attend chemo education class today. I also advised that they can pick up the Xeloda.   2. Anemia, likely iron deficient -chronic prior to diagnosis -worsened since surgery, in 9 range. -I advised them to get oral iron. -will check iron study on next lab    3. Schizophrenia, disabled, social support -he was diagnosed with schizophrenia in his 20's, managed with medication, he is on disability. -he lives with his mother; he is able to do ADLs for himself, but not iADLS. They are both on disability. His mother is also a poor historian. -he has a home care nurse that comes in. His mother seems to have difficulty caring for his wound on her own.     PLAN:  -chemo education class -lab, f/u, and start CAPOX in 1-2 weeks on  Friday   No problem-specific Assessment & Plan notes found for this encounter.   SUMMARY OF ONCOLOGIC HISTORY: Oncology History Overview Note   Cancer Staging  Cancer of left colon [facility]) Staging form: Colon and Rectum, AJCC 8th Edition - Pathologic stage from 08/08/2021: Stage IIIB (pT3, pN1a, cM0) - Signed by Truitt Merle, MD on 08/26/2021    Cancer of left colon ([place])  04/01/2020 Imaging   IMPRESSION: 1. Gallbladder decompressed bowel also partially calcified gallstones. No pericholecystic inflammation though if there is persisting clinical concern for cholecystitis right upper quadrant ultrasound could be obtained. 2. Circumferential thickening of the distal thoracic esophagus. Could reflect features of esophagitis. Correlate with clinical symptoms and consider endoscopy as clinically warranted. 3. Additional segmental thickening of the mid to distal sigmoid with focal narrowing. No acute surrounding inflammation or resulting obstruction. Findings are nonspecific, and could reflect sequela of prior inflammation/infection. However, recommend correlation with colonoscopy if not recently performed. 4. Mild circumferential bladder wall thickening and indentation of the bladder base by an enlarged prostate. Possibly sequela of chronic outlet obstruction though could correlate with urinalysis to exclude cystitis. 5. Aortic Atherosclerosis (ICD10-I70.0).   08/06/2021 Imaging   IMPRESSION: Sigmoid colonic perforation with small free intraperitoneal gas and infiltration of the mesenteric and omental fat in keeping with changes of peritonitis.   Long segment inflammatory stranding of the sigmoid colon in keeping with a severe infectious or inflammatory colitis. This terminates an area of irregular mural thickening, infiltrative soft tissue within the colonic mesentery, and focal dystrophic calcification. This may represent a chronic inflammatory process, however, a  perforated malignancy  could appear similarly. There are 2 separate points of perforation which again raise the question of an underlying malignancy.   7.1 cm gas and fluid containing pericolonic abscess within the sigmoid mesentery.   Marked inflammatory change of the terminal ileum adjacent to the pericolonic abscess with resultant small bowel obstruction. Fluid within the distal esophagus likely relates to gastroesophageal reflux the setting of vomiting.   Aortic Atherosclerosis (ICD10-I70.0).   08/08/2021 Cancer Staging   Staging form: Colon and Rectum, AJCC 8th Edition - Pathologic stage from 08/08/2021: Stage IIIB (pT3, pN1a, cM0) - Signed by Truitt Merle, MD on 08/26/2021 Stage prefix: Initial diagnosis Total positive nodes: 1 Histologic grading system: 4 grade system Histologic grade (G): G2 Residual tumor (R): R0 - None    08/08/2021 Definitive Surgery   FINAL MICROSCOPIC DIAGNOSIS:   A. COLON, SIGMOID, PARTIAL COLECTOMY:  - Invasive moderately differentiated adenocarcinoma.  - Metastatic carcinoma involving one of twelve lymph nodes (1/12).  - See oncology table below.   ADDENDUM:  Mismatch Repair Protein (IHC)  SUMMARY INTERPRETATION: NORMAL    08/14/2021 Imaging   EXAM: CT ABDOMEN AND PELVIS WITH CONTRAST  IMPRESSION: 1. Post recent sigmoid colectomy with left lower quadrant colostomy. Two small residual foci of air within the pelvic mesentery with mild adjacent thickening, but no abscess or drainable collection. Trace non organized free fluid and stranding in the pelvis. 2. Short segment of small bowel wall thickening and inflammation in the pelvis involving the distal ileum, likely reactive. 3. Dilated distal esophagus, stomach, and small bowel, without discrete transition point, favoring postoperative ileus. 4. Small bilateral pleural effusions and compressive atelectasis. 5. Heterogeneous partially enhancing 14 mm lymph node in the retroperitoneum at the aortoiliac bifurcation, not  significantly changed from prior exam. Suspected additional lymph nodes in the anterior common iliac space, not significantly changed from prior exam. Recommend attention at follow-up. 6. Additional chronic findings as described.     08/15/2021 Imaging   EXAM: CT CHEST WITH CONTRAST  IMPRESSION: 1. No evidence of thoracic metastasis. 2. Bilateral small layering pleural effusions with passive atelectasis   08/26/2021 Initial Diagnosis   Cancer of left colon ([place])   10/03/2021 -  Chemotherapy   Patient is on Treatment Plan : COLORECTAL Xelox ([place]apeox) q21d        INTERVAL HISTORY:  Matthew [name] is here for a follow up of colon cancer. He was last seen by me on 08/27/21. He presents to the clinic accompanied by his mother. He reports he is good overall. He seems more responsive today than previously. His mother reports his appetite is good but his energy level is still low.   All other systems were reviewed with the patient and are negative.  MEDICAL HISTORY:  Past Medical History:  Diagnosis Date   Schizophrenia [facility])     SURGICAL HISTORY: Past Surgical History:  Procedure Laterality Date   LAPAROTOMY N/A 08/08/2021   Procedure: EXPLORATORY LAPAROTOMY;  Surgeon: Clovis Riley, MD;  Location: [place];  Service: General;  Laterality: N/A;   PARTIAL COLECTOMY N/A 08/08/2021   Procedure: PARTIAL COLECTOMY WITH COLOSTOMY;  Surgeon: Clovis Riley, MD;  Location: [place];  Service: General;  Laterality: N/A;    I have reviewed the social history and family history with the patient and they are unchanged from previous note.  ALLERGIES:  has No Known Allergies.  MEDICATIONS:  Current Outpatient Medications  Medication Sig Dispense Refill   acetaminophen (TYLENOL) 500 MG tablet Take 2 tablets (  1,000 mg total) by mouth every 6 (six) hours as needed for mild pain or moderate pain.  0   aspirin EC 81 MG tablet Take 81 mg by mouth daily as needed (for pain or headaches).       benztropine (COGENTIN) 1 MG tablet Take 1 mg at bedtime by mouth.     cetirizine (ZYRTEC) 10 MG tablet Take 10 mg by mouth daily as needed for allergies.     ferrous sulfate 325 (65 FE) MG EC tablet Take 1 tablet (325 mg total) by mouth daily. 30 tablet 3   Multiple Vitamin (MULTIVITAMIN WITH MINERALS) TABS tablet Take 1 tablet by mouth daily.     ondansetron (ZOFRAN) 8 MG tablet Take 1 tablet (8 mg total) by mouth 2 (two) times daily as needed for refractory nausea / vomiting. Start on day 3 after chemotherapy. 30 tablet 1   paliperidone (INVEGA SUSTENNA) 156 MG/ML SUSP injection Inject 156 mg every 30 (thirty) days into the muscle.     polyethylene glycol (MIRALAX / GLYCOLAX) 17 g packet Take 17 g by mouth daily as needed for mild constipation or moderate constipation.  0   prochlorperazine (COMPAZINE) 10 MG tablet Take 1 tablet (10 mg total) by mouth every 6 (six) hours as needed (Nausea or vomiting). 30 tablet 1   No current facility-administered medications for this visit.    PHYSICAL EXAMINATION: ECOG PERFORMANCE STATUS: 1 - Symptomatic but completely ambulatory  Vitals:   09/24/21 1151  BP: 97/76  Pulse: (!) 109  Resp: 18  Temp: 98.7 F (37.1 C)  SpO2: 100%   Wt Readings from Last 3 Encounters:  09/24/21 131 lb 9.6 oz (59.7 kg)  08/27/21 132 lb 1.6 oz (59.9 kg)  08/19/21 131 lb 6.3 oz (59.6 kg)     GENERAL:alert, no distress and comfortable SKIN: skin color, texture, turgor are normal, no rashes or significant lesions EYES: normal, Conjunctiva are pink and non-injected, sclera clear ABDOMEN: (+) 1 cm open area to colostomy, improving NEURO: alert & oriented x 3 with fluent speech, no focal motor/sensory deficits  LABORATORY DATA:  I have reviewed the data as listed CBC Latest Ref Rng & Units 08/16/2021 08/15/2021 08/14/2021  WBC 4.0 - 10.5 K/uL 12.5(H) 12.7(H) 13.6(H)  Hemoglobin 13.0 - 17.0 g/dL 9.1(L) 9.4(L) 9.8(L)  Hematocrit 39.0 - 52.0 % 28.0(L) 29.1(L) 30.2(L)   Platelets 150 - 400 K/uL 647(H) 628(H) 637(H)     CMP Latest Ref Rng & Units 08/19/2021 08/18/2021 08/16/2021  Glucose 70 - 99 mg/dL 126(H) 135(H) 119(H)  BUN 6 - 20 mg/dL _0 Creatinine 0.61 - 1.24 mg/dL 0.72 0.75 0.68  Sodium 135 - 145 mmol/L 136 136 137  Potassium 3.5 - 5.1 mmol/L 3.9 4.2 4.2  Chloride 98 - 111 mmol/L 106 106 105  CO2 22 - 32 mmol/L _1 Calcium 8.9 - 10.3 mg/dL 8.2(L) 8.6(L) 8.2(L)  Total Protein 6.5 - 8.1 g/dL - 6.2(L) -  Total Bilirubin 0.3 - 1.2 mg/dL - 0.5 -  Alkaline Phos 38 - 126 U/L - 109 -  AST 15 - 41 U/L - 30 -  ALT 0 - 44 U/L - 49(H) -      RADIOGRAPHIC STUDIES: I have personally reviewed the radiological images as listed and agreed with the findings in the report. No results found.    No orders of the defined types were placed in this encounter.  All questions were answered. The patient knows to call the clinic  with any problems, questions or concerns. No barriers to learning was detected. The total time spent in the appointment was 30 minutes.     Truitt Merle, MD 09/24/2021   I, Wilburn Mylar, am acting as scribe for Truitt Merle, MD.   I have reviewed the above documentation for accuracy and completeness, and I agree with the above.

## 2021-09-25 ENCOUNTER — Telehealth: Payer: Self-pay | Admitting: Pharmacist

## 2021-09-25 ENCOUNTER — Telehealth: Payer: Self-pay

## 2021-09-25 ENCOUNTER — Other Ambulatory Visit (HOSPITAL_COMMUNITY): Payer: Self-pay

## 2021-09-25 ENCOUNTER — Telehealth: Payer: Self-pay | Admitting: Hematology

## 2021-09-25 DIAGNOSIS — C186 Malignant neoplasm of descending colon: Secondary | ICD-10-CM

## 2021-09-25 MED ORDER — CAPECITABINE 500 MG PO TABS
850.0000 mg/m2 | ORAL_TABLET | Freq: Two times a day (BID) | ORAL | 1 refills | Status: DC
  Filled 2021-09-25: qty 84, 14d supply, fill #0

## 2021-09-25 NOTE — Telephone Encounter (Signed)
Oral Oncology Patient Advocate Encounter  After completing a benefits investigation, prior authorization for Xeloda is not required at this time through [facility].  Patient's copay is $4.    [place] Patient [place] Phone [phone] Fax [phone] 09/25/2021 8:06 AM

## 2021-09-25 NOTE — Telephone Encounter (Signed)
Left message with rescheduled upcoming appointment per 2/22 staff message.

## 2021-09-25 NOTE — Telephone Encounter (Signed)
LVM on pt's telephone reminding him of his appt 10/01/2021 @2pm  for Pt Education (chemo class).  Informed pt that the appt is scheduled at [facility] and to arrive 15 mins prior to his appt time.  Instructed pt to contact Dr. Ernestina Penna office should you have additional questions or concerns.  Also tried contacting pt's mother but telephone just rung and was unable to leave a voicemail.

## 2021-09-25 NOTE — Telephone Encounter (Signed)
Oral Oncology Pharmacist Encounter  Received new prescription for Xeloda (capecitabine) for the treatment of stage IIIB colon cancer in conjunction with oxaliplatin, planned duration 3 months.  BMP from 08/19/21 assessed, no baseline dose adjustments required. Prescription dose and frequency assessed for appropriateness. Planned start date per MD is 10/10/21.  Current medication list in Epic reviewed, DDIs with Xeloda identified: Category C DDI between Xeloda and Ondansetron due to risk of Qtc prolongation with fluorouracil products. Noted patient only taking PRN and PO route, risk higher with IV administration. No change in therapy warranted at this time. (Qtc 418 ms on 08/07/21)  Evaluated chart and no patient barriers to medication adherence noted.   Patient agreement for treatment documented in MD note on 09/24/21.  Prescription has been e-scribed to the [facility] for benefits analysis and approval.  Oral Oncology Clinic will continue to follow for insurance authorization, copayment issues, initial counseling and start date.  Leron Croak, PharmD, BCPS Hematology/Oncology Clinical Pharmacist [facility] [place] [phone] 09/25/2021 9:25 AM

## 2021-09-26 ENCOUNTER — Encounter: Payer: Self-pay | Admitting: *Deleted

## 2021-09-26 NOTE — Progress Notes (Signed)
[place] Social Work Progress Notes  Social work Theatre manager has been unable to contact Mr. Papadopoulos or his mother, after 3 attempts by phone and two attempts in person at [facility]. Please make new referral to Social Work for this pt if needed in the future.   Rosary Lively, Social Work Intern Supervised by Gwinda Maine, LCSW

## 2021-10-01 ENCOUNTER — Other Ambulatory Visit (HOSPITAL_COMMUNITY): Payer: Self-pay

## 2021-10-01 ENCOUNTER — Inpatient Hospital Stay: Payer: Medicaid Other | Attending: Hematology

## 2021-10-01 ENCOUNTER — Other Ambulatory Visit: Payer: Self-pay

## 2021-10-01 ENCOUNTER — Inpatient Hospital Stay: Payer: Medicaid Other

## 2021-10-01 DIAGNOSIS — J90 Pleural effusion, not elsewhere classified: Secondary | ICD-10-CM | POA: Insufficient documentation

## 2021-10-01 DIAGNOSIS — D649 Anemia, unspecified: Secondary | ICD-10-CM | POA: Insufficient documentation

## 2021-10-01 DIAGNOSIS — Z5111 Encounter for antineoplastic chemotherapy: Secondary | ICD-10-CM | POA: Insufficient documentation

## 2021-10-01 DIAGNOSIS — C187 Malignant neoplasm of sigmoid colon: Secondary | ICD-10-CM | POA: Insufficient documentation

## 2021-10-01 DIAGNOSIS — F209 Schizophrenia, unspecified: Secondary | ICD-10-CM | POA: Insufficient documentation

## 2021-10-01 DIAGNOSIS — C186 Malignant neoplasm of descending colon: Secondary | ICD-10-CM | POA: Insufficient documentation

## 2021-10-01 DIAGNOSIS — Z9049 Acquired absence of other specified parts of digestive tract: Secondary | ICD-10-CM | POA: Insufficient documentation

## 2021-10-01 DIAGNOSIS — Z79899 Other long term (current) drug therapy: Secondary | ICD-10-CM | POA: Insufficient documentation

## 2021-10-01 DIAGNOSIS — C772 Secondary and unspecified malignant neoplasm of intra-abdominal lymph nodes: Secondary | ICD-10-CM | POA: Insufficient documentation

## 2021-10-01 MED ORDER — CAPECITABINE 500 MG PO TABS
850.0000 mg/m2 | ORAL_TABLET | Freq: Two times a day (BID) | ORAL | 1 refills | Status: DC
  Filled 2021-10-01: qty 84, 14d supply, fill #0
  Filled 2021-10-01: qty 84, 21d supply, fill #0
  Filled 2021-10-14: qty 84, 21d supply, fill #1

## 2021-10-01 NOTE — Telephone Encounter (Signed)
Oral Chemotherapy Pharmacist Encounter  ? ?Spoke with patient's mother, Bernard Slayden, today to follow up regarding patient's oral chemotherapy medication: Xeloda (capecitabine) ? ?Medication will be shipped from the [facility] on 10/01/21. I have asked patient's mother to bring capecitabine with them to their appointment on 10/10/21. I stressed multiple times to patient's mother that he will not start the capecitabine until 10/10/21. Ms. Parson expressed understanding.  ? ?Leron Croak, PharmD, BCPS ?Hematology/Oncology Clinical Pharmacist ?[facility] Oral Chemotherapy Navigation Clinics ?[phone] ?10/01/2021 9:59 AM ? ? ? ? ?

## 2021-10-07 NOTE — Progress Notes (Signed)
[facility] OFFICE PROGRESS NOTE  Medicine, [facility] [address] [place] [address]  DIAGNOSIS:  f/u of colon cancer  Oncology History Overview Note   Cancer Staging  Cancer of left colon [facility]) Staging form: Colon and Rectum, AJCC 8th Edition - Pathologic stage from 08/08/2021: Stage IIIB (pT3, pN1a, cM0) - Signed by Truitt Merle, MD on 08/26/2021    Cancer of left colon ([place])  04/01/2020 Imaging   IMPRESSION: 1. Gallbladder decompressed bowel also partially calcified gallstones. No pericholecystic inflammation though if there is persisting clinical concern for cholecystitis right upper quadrant ultrasound could be obtained. 2. Circumferential thickening of the distal thoracic esophagus. Could reflect features of esophagitis. Correlate with clinical symptoms and consider endoscopy as clinically warranted. 3. Additional segmental thickening of the mid to distal sigmoid with focal narrowing. No acute surrounding inflammation or resulting obstruction. Findings are nonspecific, and could reflect sequela of prior inflammation/infection. However, recommend correlation with colonoscopy if not recently performed. 4. Mild circumferential bladder wall thickening and indentation of the bladder base by an enlarged prostate. Possibly sequela of chronic outlet obstruction though could correlate with urinalysis to exclude cystitis. 5. Aortic Atherosclerosis (ICD10-I70.0).   08/06/2021 Imaging   IMPRESSION: Sigmoid colonic perforation with small free intraperitoneal gas and infiltration of the mesenteric and omental fat in keeping with changes of peritonitis.   Long segment inflammatory stranding of the sigmoid colon in keeping with a severe infectious or inflammatory colitis. This terminates an area of irregular mural thickening, infiltrative soft tissue within the colonic mesentery, and focal dystrophic calcification. This may represent a chronic inflammatory  process, however, a perforated malignancy could appear similarly. There are 2 separate points of perforation which again raise the question of an underlying malignancy.   7.1 cm gas and fluid containing pericolonic abscess within the sigmoid mesentery.   Marked inflammatory change of the terminal ileum adjacent to the pericolonic abscess with resultant small bowel obstruction. Fluid within the distal esophagus likely relates to gastroesophageal reflux the setting of vomiting.   Aortic Atherosclerosis (ICD10-I70.0).   08/08/2021 Cancer Staging   Staging form: Colon and Rectum, AJCC 8th Edition - Pathologic stage from 08/08/2021: Stage IIIB (pT3, pN1a, cM0) - Signed by Truitt Merle, MD on 08/26/2021 Stage prefix: Initial diagnosis Total positive nodes: 1 Histologic grading system: 4 grade system Histologic grade (G): G2 Residual tumor (R): R0 - None    08/08/2021 Definitive Surgery   FINAL MICROSCOPIC DIAGNOSIS:   A. COLON, SIGMOID, PARTIAL COLECTOMY:  - Invasive moderately differentiated adenocarcinoma.  - Metastatic carcinoma involving one of twelve lymph nodes (1/12).  - See oncology table below.   ADDENDUM:  Mismatch Repair Protein ([facility])  SUMMARY INTERPRETATION: NORMAL    08/14/2021 Imaging   EXAM: CT ABDOMEN AND PELVIS WITH CONTRAST  IMPRESSION: 1. Post recent sigmoid colectomy with left lower quadrant colostomy. Two small residual foci of air within the pelvic mesentery with mild adjacent thickening, but no abscess or drainable collection. Trace non organized free fluid and stranding in the pelvis. 2. Short segment of small bowel wall thickening and inflammation in the pelvis involving the distal ileum, likely reactive. 3. Dilated distal esophagus, stomach, and small bowel, without discrete transition point, favoring postoperative ileus. 4. Small bilateral pleural effusions and compressive atelectasis. 5. Heterogeneous partially enhancing 14 mm lymph node in the retroperitoneum  at the aortoiliac bifurcation, not significantly changed from prior exam. Suspected additional lymph nodes in the anterior common iliac space, not significantly changed from prior  exam. Recommend attention at follow-up. 6. Additional chronic findings as described.     08/15/2021 Imaging   EXAM: CT CHEST WITH CONTRAST  IMPRESSION: 1. No evidence of thoracic metastasis. 2. Bilateral small layering pleural effusions with passive atelectasis   08/26/2021 Initial Diagnosis   Cancer of left colon ([place])   10/10/2021 -  Chemotherapy   Patient is on Treatment Plan : COLORECTAL Xelox ([place]ox) q21d      CURRENT THERAPY: CAPOX. Today is day 1 with cycle #1.   INTERVAL HISTORY: Matthew [name] 56 y.o. male returns to the clinic today for a follow-up visit accompanied by his mother.  The patient was recently diagnosed with colorectal cancer status post colectomy.  He is here today for evaluation and consideration of starting day 1 cycle 1 of CAPOX.  The patient has the prescription for Xeloda.  He is scheduled to meet with a oral chemotherapy pharmacist again today to review how to take this medication as the patient and his mother have poor medical literacy.  Overall, the patient is feeling fairly well today he denies any fever, chills, night sweats, or unexplained weight loss.  He lost a lot of weight prior to his diagnosis but his weight has been stable for the last few weeks.  His mom reports that he has a fine appetite.  Denies any abdominal pain.  He has a colostomy bag.  Denies any unusual diarrhea or constipation.  Denies any nausea, vomiting, diarrhea, or constipation.  Denies any dysphagia.  Denies any jaundice or itching.  He is currently taking iron over-the-counter as recommended by Dr. Burr Medico.  The patient denies any abnormal bleeding or bruising.  He is here today for evaluation, to review how to take his antiemetics and oral chemotherapy, and repeat blood work before undergoing cycle  #1.   MEDICAL HISTORY: Past Medical History:  Diagnosis Date   Schizophrenia ([place])     ALLERGIES:  has No Known Allergies.  MEDICATIONS:  Current Outpatient Medications  Medication Sig Dispense Refill   acetaminophen (TYLENOL) 500 MG tablet Take 2 tablets (1,000 mg total) by mouth every 6 (six) hours as needed for mild pain or moderate pain.  0   aspirin EC 81 MG tablet Take 81 mg by mouth daily as needed (for pain or headaches).      benztropine (COGENTIN) 1 MG tablet Take 1 mg at bedtime by mouth.     capecitabine (XELODA) 500 MG tablet Take 3 tablets (1,500 mg total) by mouth 2 (two) times daily after a meal. Take for 14 days, then none for 7 days. Start date: 10/10/21 84 tablet 1   cetirizine (ZYRTEC) 10 MG tablet Take 10 mg by mouth daily as needed for allergies.     ferrous sulfate 325 (65 FE) MG EC tablet Take 1 tablet (325 mg total) by mouth daily. 30 tablet 3   Multiple Vitamin (MULTIVITAMIN WITH MINERALS) TABS tablet Take 1 tablet by mouth daily.     ondansetron (ZOFRAN) 8 MG tablet Take 1 tablet (8 mg total) by mouth 2 (two) times daily as needed for refractory nausea / vomiting. Start on day 3 after chemotherapy. 30 tablet 1   paliperidone (INVEGA SUSTENNA) 156 MG/ML SUSP injection Inject 156 mg every 30 (thirty) days into the muscle.     polyethylene glycol (MIRALAX / GLYCOLAX) 17 g packet Take 17 g by mouth daily as needed for mild constipation or moderate constipation.  0   prochlorperazine (COMPAZINE) 10 MG tablet Take 1 tablet (10  mg total) by mouth every 6 (six) hours as needed (Nausea or vomiting). 30 tablet 1   No current facility-administered medications for this visit.    SURGICAL HISTORY:  Past Surgical History:  Procedure Laterality Date   LAPAROTOMY N/A 08/08/2021   Procedure: EXPLORATORY LAPAROTOMY;  Surgeon: Clovis Riley, MD;  Location: [facility];  Service: General;  Laterality: N/A;   PARTIAL COLECTOMY N/A 08/08/2021   Procedure: PARTIAL COLECTOMY WITH  COLOSTOMY;  Surgeon: Clovis Riley, MD;  Location: [facility];  Service: General;  Laterality: N/A;    REVIEW OF SYSTEMS:   Review of Systems  Constitutional: Negative for appetite change, chills, fatigue, fever and unexpected weight change.  HENT: Negative for mouth sores, nosebleeds, sore throat and trouble swallowing.   Eyes: Negative for eye problems and icterus.  Respiratory: Negative for cough, hemoptysis, shortness of breath and wheezing.   Cardiovascular: Negative for chest pain and leg swelling.  Gastrointestinal: Negative for abdominal pain, constipation, diarrhea, nausea and vomiting.  Genitourinary: Negative for bladder incontinence, difficulty urinating, dysuria, frequency and hematuria.   Musculoskeletal: Negative for back pain, gait problem, neck pain and neck stiffness.  Skin: Negative for itching and rash.  Neurological: Negative for dizziness, extremity weakness, gait problem, headaches, light-headedness and seizures.  Hematological: Negative for adenopathy. Does not bruise/bleed easily.  Psychiatric/Behavioral: Negative for confusion, depression and sleep disturbance. The patient is not nervous/anxious.     PHYSICAL EXAMINATION:  Blood pressure 99/67, pulse (!) 104, temperature (!) 97.3 F (36.3 C), temperature source Temporal, resp. rate 19, height _0  (1.753 m), weight 133 lb (60.3 kg), SpO2 100 %.  ECOG PERFORMANCE STATUS: 1  Physical Exam  Constitutional: Oriented to person, place, and time and thin appearing male and in no distress.  HENT:  Head: Normocephalic and atraumatic.  Mouth/Throat: Oropharynx is clear and moist. No oropharyngeal exudate.  Eyes: Conjunctivae are normal. Right eye exhibits no discharge. Left eye exhibits no discharge. No scleral icterus.  Neck: Normal range of motion. Neck supple.  Cardiovascular: Normal rate, regular rhythm, normal heart sounds and intact distal pulses.   Pulmonary/Chest: Effort normal and breath sounds normal. No  respiratory distress. No wheezes. No rales.  Abdominal: Soft. Bowel sounds are normal. Exhibits no distension and no mass. There is no tenderness.  Colostomy bag in place.  Musculoskeletal: Normal range of motion. Exhibits no edema.  Lymphadenopathy:    No cervical adenopathy.  Neurological: Alert and oriented to person, place, and time. Exhibits normal muscle tone. Gait normal. Coordination normal.  Skin: Skin is warm and dry. No rash noted. Not diaphoretic. No erythema. No pallor.  Psychiatric: Mood, memory and judgment normal.  Patient not very communicative likely due to his schizophrenia. Vitals reviewed.  LABORATORY DATA: Lab Results  Component Value Date   WBC 2.7 (L) 10/10/2021   HGB 11.6 (L) 10/10/2021   HCT 37.0 (L) 10/10/2021   MCV 80.8 10/10/2021   PLT 435 (H) 10/10/2021      Chemistry      Component Value Date/Time   NA 141 10/10/2021 0830   K 3.8 10/10/2021 0830   CL 109 10/10/2021 0830   CO2 25 10/10/2021 0830   BUN 8 10/10/2021 0830   CREATININE 0.80 10/10/2021 0830      Component Value Date/Time   CALCIUM 9.1 10/10/2021 0830   ALKPHOS 68 10/10/2021 0830   AST 10 (L) 10/10/2021 0830   ALT 7 10/10/2021 0830   BILITOT 0.5 10/10/2021 0830  RADIOGRAPHIC STUDIES:  No results found.   ASSESSMENT/PLAN:  Zohair Epp is a 56 y.o. male with    1. Cancer of left colon, stage IIIB p(T3, N1aM0), MSS -presented to ED on 08/06/21 with recurrent upper abdominal pain. CT AP showed sigmoid colon perforation, with possible underlying malignancy.  This was complicated with abdominal abscess, which required surgery and IV antibiotics.  -emergent partial colectomy on 08/08/21 by Dr. Windle Guard showed invasive moderately differentiated adenocarcinoma. Margins negative, one lymph node showed metastatic carcinoma (1/12). -Staging CT scan was negative for metastatic disease. -his colostomy wound is healing well, with a small area of shallow ulcer remaining. -Dr. Burr Medico  recommends treatment with CAPOX . He will take Xeloda 1500 mg BID days 1-14 and off for 7 days every 21 days. He receives oxaliplatin on day 1 of every 21 day cycle.  -Patient met with oral chemotherapy pharmacist today for education.  They have poor medical literacy and were given medication boxes and a calendar. -Labs today show low white blood cell count of 2.7 and ANC of 1.6.  I reviewed with Dr. Burr Medico.  He is okay to proceed with treatment today at the same dose.  We will recheck his labs next week.  The patient denies any concerning complaints today or infections. -I also reviewed the patient's antiemetics with him and his mother.  Advised him to use Compazine anytime every 6 hours as needed for nausea.  Advised them to use Zofran starting 3 days after chemotherapy since he receives Aloxi during his infusion.  Discussed that if he needs better control of his nausea, he may alternate Compazine and Zofran every 4 hours starting on Monday for nausea. -Labs and follow up with Dr. Burr Medico next week on 10/17/21 for 1 week follow up -Discussed with the infusion room RN, they okayed it for the patient's mother to accompany him in infusion due to his psychiatric diagnoses and to help facilitate communication.   2. Anemia, likely iron deficient -chronic prior to diagnosis -worsened since surgery, in 9 range. -Dr. Jacklynn Bue previously advised them to get oral iron. -will check iron study on next lab. I will place the orders.  -Labs today hemoglobin improving to 11.6 from 9.1. -The patient is reportedly taking iron per his mother.   3. Schizophrenia, disabled, social support -he was diagnosed with schizophrenia in his 20's, managed with medication, he is on disability. -he lives with his mother; he is able to do ADLs for himself, but not iADLS. They are both on disability. His mother is also a poor historian. -he has a home care nurse that comes in. His mother seems to have difficulty caring for his wound on her  own. -Patient and mother met with oral chemotherapy pharmacist today and calender given on when to take Xeloda -Copy of schedule given to patient and his mother     PLAN:  -proceed with day 1 c1 today -labs and 1 week follow up with Dr. Burr Medico next week -Gave the patient a copy of their calendar and reviewed with them. -Encouraged him to continue taking iron we will check iron studies at next lab appointment -Reviewed antiemetics with the patient -Patient received education from oral chemotherapy pharmacist today.  Patient given medication boxes and calendar.     No orders of the defined types were placed in this encounter.    The total time spent in the appointment was 20-29 minutes.   Koni Kannan L Kary Colaizzi, PA-C 10/10/21

## 2021-10-09 ENCOUNTER — Other Ambulatory Visit: Payer: Self-pay

## 2021-10-09 DIAGNOSIS — C186 Malignant neoplasm of descending colon: Secondary | ICD-10-CM

## 2021-10-09 MED FILL — Dexamethasone Sodium Phosphate Inj 100 MG/10ML: INTRAMUSCULAR | Qty: 1 | Status: AC

## 2021-10-10 ENCOUNTER — Encounter: Payer: Self-pay | Admitting: Physician Assistant

## 2021-10-10 ENCOUNTER — Inpatient Hospital Stay: Payer: Medicaid Other

## 2021-10-10 ENCOUNTER — Inpatient Hospital Stay (HOSPITAL_BASED_OUTPATIENT_CLINIC_OR_DEPARTMENT_OTHER): Payer: Medicaid Other | Admitting: Physician Assistant

## 2021-10-10 ENCOUNTER — Other Ambulatory Visit: Payer: Self-pay

## 2021-10-10 VITALS — BP 99/67 | HR 104 | Temp 97.3°F | Resp 19 | Ht 69.0 in | Wt 133.0 lb

## 2021-10-10 VITALS — HR 87

## 2021-10-10 DIAGNOSIS — C186 Malignant neoplasm of descending colon: Secondary | ICD-10-CM

## 2021-10-10 DIAGNOSIS — Z5111 Encounter for antineoplastic chemotherapy: Secondary | ICD-10-CM

## 2021-10-10 DIAGNOSIS — F209 Schizophrenia, unspecified: Secondary | ICD-10-CM | POA: Diagnosis not present

## 2021-10-10 DIAGNOSIS — C187 Malignant neoplasm of sigmoid colon: Secondary | ICD-10-CM | POA: Diagnosis not present

## 2021-10-10 DIAGNOSIS — D649 Anemia, unspecified: Secondary | ICD-10-CM | POA: Diagnosis not present

## 2021-10-10 DIAGNOSIS — Z79899 Other long term (current) drug therapy: Secondary | ICD-10-CM | POA: Diagnosis not present

## 2021-10-10 DIAGNOSIS — J90 Pleural effusion, not elsewhere classified: Secondary | ICD-10-CM | POA: Diagnosis not present

## 2021-10-10 DIAGNOSIS — Z9049 Acquired absence of other specified parts of digestive tract: Secondary | ICD-10-CM | POA: Diagnosis not present

## 2021-10-10 DIAGNOSIS — C772 Secondary and unspecified malignant neoplasm of intra-abdominal lymph nodes: Secondary | ICD-10-CM | POA: Diagnosis not present

## 2021-10-10 LAB — CBC WITH DIFFERENTIAL (CANCER CENTER ONLY)
Abs Immature Granulocytes: 0 10*3/uL (ref 0.00–0.07)
Basophils Absolute: 0 10*3/uL (ref 0.0–0.1)
Basophils Relative: 1 %
Eosinophils Absolute: 0.1 10*3/uL (ref 0.0–0.5)
Eosinophils Relative: 2 %
HCT: 37 % — ABNORMAL LOW (ref 39.0–52.0)
Hemoglobin: 11.6 g/dL — ABNORMAL LOW (ref 13.0–17.0)
Immature Granulocytes: 0 %
Lymphocytes Relative: 30 %
Lymphs Abs: 0.8 10*3/uL (ref 0.7–4.0)
MCH: 25.3 pg — ABNORMAL LOW (ref 26.0–34.0)
MCHC: 31.4 g/dL (ref 30.0–36.0)
MCV: 80.8 fL (ref 80.0–100.0)
Monocytes Absolute: 0.2 10*3/uL (ref 0.1–1.0)
Monocytes Relative: 8 %
Neutro Abs: 1.6 10*3/uL — ABNORMAL LOW (ref 1.7–7.7)
Neutrophils Relative %: 59 %
Platelet Count: 435 10*3/uL — ABNORMAL HIGH (ref 150–400)
RBC: 4.58 MIL/uL (ref 4.22–5.81)
RDW: 14.3 % (ref 11.5–15.5)
WBC Count: 2.7 10*3/uL — ABNORMAL LOW (ref 4.0–10.5)
nRBC: 0 % (ref 0.0–0.2)

## 2021-10-10 LAB — CMP (CANCER CENTER ONLY)
ALT: 7 U/L (ref 0–44)
AST: 10 U/L — ABNORMAL LOW (ref 15–41)
Albumin: 3.9 g/dL (ref 3.5–5.0)
Alkaline Phosphatase: 68 U/L (ref 38–126)
Anion gap: 7 (ref 5–15)
BUN: 8 mg/dL (ref 6–20)
CO2: 25 mmol/L (ref 22–32)
Calcium: 9.1 mg/dL (ref 8.9–10.3)
Chloride: 109 mmol/L (ref 98–111)
Creatinine: 0.8 mg/dL (ref 0.61–1.24)
GFR, Estimated: >60 mL/min (ref >60)
Glucose, Bld: 97 mg/dL (ref 70–99)
Potassium: 3.8 mmol/L (ref 3.5–5.1)
Sodium: 141 mmol/L (ref 135–145)
Total Bilirubin: 0.5 mg/dL (ref 0.3–1.2)
Total Protein: 6.8 g/dL (ref 6.5–8.1)

## 2021-10-10 MED ORDER — PALONOSETRON HCL INJECTION 0.25 MG/5ML
0.2500 mg | Freq: Once | INTRAVENOUS | Status: AC
  Administered 2021-10-10: 0.25 mg via INTRAVENOUS
  Filled 2021-10-10: qty 5

## 2021-10-10 MED ORDER — OXALIPLATIN CHEMO INJECTION 100 MG/20ML
110.0000 mg/m2 | Freq: Once | INTRAVENOUS | Status: AC
  Administered 2021-10-10: 190 mg via INTRAVENOUS
  Filled 2021-10-10: qty 38

## 2021-10-10 MED ORDER — SODIUM CHLORIDE 0.9 % IV SOLN
10.0000 mg | Freq: Once | INTRAVENOUS | Status: AC
  Administered 2021-10-10: 10 mg via INTRAVENOUS
  Filled 2021-10-10: qty 10

## 2021-10-10 MED ORDER — DEXTROSE 5 % IV SOLN: Freq: Once | INTRAVENOUS | Status: AC

## 2021-10-10 NOTE — Patient Instructions (Addendum)
[facility]  Discharge Instructions: Thank you for choosing [facility] to provide your oncology and hematology care.   If you have a lab appointment with the [facility], please go directly to the [facility] and check in at the registration area.   Wear comfortable clothing and clothing appropriate for easy access to any Portacath or PICC line.   We strive to give you quality time with your provider. You may need to reschedule your appointment if you arrive late (15 or more minutes).  Arriving late affects you and other patients whose appointments are after yours.  Also, if you miss three or more appointments without notifying the office, you may be dismissed from the clinic at the providers discretion.      For prescription refill requests, have your pharmacy contact our office and allow 72 hours for refills to be completed.    Today you received the following chemotherapy and/or immunotherapy agents: Oxaliplatin      To help prevent nausea and vomiting after your treatment, we encourage you to take your nausea medication as directed.  BELOW ARE SYMPTOMS THAT SHOULD BE REPORTED IMMEDIATELY: *FEVER GREATER THAN 100.4 F (38 C) OR HIGHER *CHILLS OR SWEATING *NAUSEA AND VOMITING THAT IS NOT CONTROLLED WITH YOUR NAUSEA MEDICATION *UNUSUAL SHORTNESS OF BREATH *UNUSUAL BRUISING OR BLEEDING *URINARY PROBLEMS (pain or burning when urinating, or frequent urination) *BOWEL PROBLEMS (unusual diarrhea, constipation, pain near the anus) TENDERNESS IN MOUTH AND THROAT WITH OR WITHOUT PRESENCE OF ULCERS (sore throat, sores in mouth, or a toothache) UNUSUAL RASH, SWELLING OR PAIN  UNUSUAL VAGINAL DISCHARGE OR ITCHING   Items with * indicate a potential emergency and should be followed up as soon as possible or go to the Emergency Department if any problems should occur.  Please show the CHEMOTHERAPY ALERT CARD or IMMUNOTHERAPY ALERT CARD at check-in  to the Emergency Department and triage nurse.  Should you have questions after your visit or need to cancel or reschedule your appointment, please contact [facility]  Dept: [phone]  and follow the prompts.  Office hours are 8:00 a.m. to 4:30 p.m. Monday - Friday. Please note that voicemails left after 4:00 p.m. may not be returned until the following business day.  We are closed weekends and major holidays. You have access to a nurse at all times for urgent questions. Please call the main number to the clinic Dept: [phone] and follow the prompts.   For any non-urgent questions, you may also contact your provider using MyChart. We now offer e-Visits for anyone 28 and older to request care online for non-urgent symptoms. For details visit [email or web address].   Also download the MyChart app! Go to the app store, search "MyChart", open the app, select [facility], and log in with your MyChart username and password.  Due to Covid, a mask is required upon entering the hospital/clinic. If you do not have a mask, one will be given to you upon arrival. For doctor visits, patients may have 1 support person aged 48 or older with them. For treatment visits, patients cannot have anyone with them due to current Covid guidelines and our immunocompromised population.   Oxaliplatin Injection What is this medication? OXALIPLATIN (ox AL i PLA tin) is a chemotherapy drug. It targets fast dividing cells, like cancer cells, and causes these cells to die. This medicine is used to treat cancers of the colon and rectum, and many other cancers. This medicine may  be used for other purposes; ask your health care provider or pharmacist if you have questions. COMMON BRAND NAME(S): Eloxatin What should I tell my care team before I take this medication? They need to know if you have any of these conditions: heart disease history of irregular heartbeat liver disease low blood  counts, like white cells, platelets, or red blood cells lung or breathing disease, like asthma take medicines that treat or prevent blood clots tingling of the fingers or toes, or other nerve disorder an unusual or allergic reaction to oxaliplatin, other chemotherapy, other medicines, foods, dyes, or preservatives pregnant or trying to get pregnant breast-feeding How should I use this medication? This drug is given as an infusion into a vein. It is administered in a hospital or clinic by a specially trained health care professional. Talk to your pediatrician regarding the use of this medicine in children. Special care may be needed. Overdosage: If you think you have taken too much of this medicine contact a poison control center or emergency room at once. NOTE: This medicine is only for you. Do not share this medicine with others. What if I miss a dose? It is important not to miss a dose. Call your doctor or health care professional if you are unable to keep an appointment. What may interact with this medication? Do not take this medicine with any of the following medications: cisapride dronedarone pimozide thioridazine This medicine may also interact with the following medications: aspirin and aspirin-like medicines certain medicines that treat or prevent blood clots like warfarin, apixaban, dabigatran, and rivaroxaban cisplatin cyclosporine diuretics medicines for infection like acyclovir, adefovir, amphotericin B, bacitracin, cidofovir, foscarnet, ganciclovir, gentamicin, pentamidine, vancomycin NSAIDs, medicines for pain and inflammation, like ibuprofen or naproxen other medicines that prolong the QT interval (an abnormal heart rhythm) pamidronate zoledronic acid This list may not describe all possible interactions. Give your health care provider a list of all the medicines, herbs, non-prescription drugs, or dietary supplements you use. Also tell them if you smoke, drink alcohol,  or use illegal drugs. Some items may interact with your medicine. What should I watch for while using this medication? Your condition will be monitored carefully while you are receiving this medicine. You may need blood work done while you are taking this medicine. This medicine may make you feel generally unwell. This is not uncommon as chemotherapy can affect healthy cells as well as cancer cells. Report any side effects. Continue your course of treatment even though you feel ill unless your healthcare professional tells you to stop. This medicine can make you more sensitive to cold. Do not drink cold drinks or use ice. Cover exposed skin before coming in contact with cold temperatures or cold objects. When out in cold weather wear warm clothing and cover your mouth and nose to warm the air that goes into your lungs. Tell your doctor if you get sensitive to the cold. Do not become pregnant while taking this medicine or for 9 months after stopping it. Women should inform their health care professional if they wish to become pregnant or think they might be pregnant. Men should not father a child while taking this medicine and for 6 months after stopping it. There is potential for serious side effects to an unborn child. Talk to your health care professional for more information. Do not breast-feed a child while taking this medicine or for 3 months after stopping it. This medicine has caused ovarian failure in some women. This medicine may make  it more difficult to get pregnant. Talk to your health care professional if you are concerned about your fertility. This medicine has caused decreased sperm counts in some men. This may make it more difficult to father a child. Talk to your health care professional if you are concerned about your fertility. This medicine may increase your risk of getting an infection. Call your health care professional for advice if you get a fever, chills, or sore throat, or other  symptoms of a cold or flu. Do not treat yourself. Try to avoid being around people who are sick. Avoid taking medicines that contain aspirin, acetaminophen, ibuprofen, naproxen, or ketoprofen unless instructed by your health care professional. These medicines may hide a fever. Be careful brushing or flossing your teeth or using a toothpick because you may get an infection or bleed more easily. If you have any dental work done, tell your dentist you are receiving this medicine. What side effects may I notice from receiving this medication? Side effects that you should report to your doctor or health care professional as soon as possible: allergic reactions like skin rash, itching or hives, swelling of the face, lips, or tongue breathing problems cough low blood counts - this medicine may decrease the number of white blood cells, red blood cells, and platelets. You may be at increased risk for infections and bleeding nausea, vomiting pain, redness, or irritation at site where injected pain, tingling, numbness in the hands or feet signs and symptoms of bleeding such as bloody or black, tarry stools; red or dark brown urine; spitting up blood or brown material that looks like coffee grounds; red spots on the skin; unusual bruising or bleeding from the eyes, gums, or nose signs and symptoms of a dangerous change in heartbeat or heart rhythm like chest pain; dizziness; fast, irregular heartbeat; palpitations; feeling faint or lightheaded; falls signs and symptoms of infection like fever; chills; cough; sore throat; pain or trouble passing urine signs and symptoms of liver injury like dark yellow or brown urine; general ill feeling or flu-like symptoms; light-colored stools; loss of appetite; nausea; right upper belly pain; unusually weak or tired; yellowing of the eyes or skin signs and symptoms of low red blood cells or anemia such as unusually weak or tired; feeling faint or lightheaded; falls signs and  symptoms of muscle injury like dark urine; trouble passing urine or change in the amount of urine; unusually weak or tired; muscle pain; back pain Side effects that usually do not require medical attention (report to your doctor or health care professional if they continue or are bothersome): changes in taste diarrhea gas hair loss loss of appetite mouth sores This list may not describe all possible side effects. Call your doctor for medical advice about side effects. You may report side effects to FDA at 1-800-FDA-1088. Where should I keep my medication? This drug is given in a hospital or clinic and will not be stored at home. NOTE: This sheet is a summary. It may not cover all possible information. If you have questions about this medicine, talk to your doctor, pharmacist, or health care provider.  2022 Elsevier/Gold Standard (2021-04-08 00:00:00)

## 2021-10-10 NOTE — Telephone Encounter (Addendum)
Oral Chemotherapy Pharmacist Encounter ? ?I met with patient's and patient's mother, Matthew [name], in clinic for overview of: Xeloda (capecitabine) for the treatment of stage IIIB colon cancer in conjunction with infusional oxaliplatin, planned duration 3 months of therapy.  ? ?Counseled patient on administration, dosing, side effects, monitoring, drug-food interactions, safe handling, storage, and disposal. ? ?Patient will take Xeloda 538m tablets, 3 tablets (15015m by mouth in AM and 3 tabs (150048mby mouth in PM, within 30 minutes of finishing meals, on days 1-14 of each 21 day cycle.  ? ?Patient's mother brought in patient's capecitabine, which I have put in a labeled pill box for AM and PM doses for days 1-14 of treatment. They expressed understanding with how to utilize the pill box.  ? ?Oxaliplatin will be infused on day 1 of each 21 day cycle. ? ?Xeloda and oxaliplatin start date: 10/10/21 ? ?Adverse effects include but are not limited to: fatigue, decreased blood counts, GI upset, diarrhea, mouth sores, and hand-foot syndrome. ? ?Patient has anti-emetic on hand and knows to take it if nausea develops.   ?Patient will obtain anti diarrheal and alert the office of 4 or more loose stools above baseline. ? ?Reviewed with patient importance of keeping a medication schedule and plan for any missed doses. No barriers to medication adherence identified. ? ?Medication reconciliation performed and medication/allergy list updated. ? ?Insurance authorization for Xeloda has been obtained. This was shipped to patient's home on 10/01/21. ? ?Patient and patient's mother informed the pharmacy will reach out 5-7 days prior to needing next fill of Xeloda to coordinate continued medication acquisition to prevent break in therapy. ? ?All questions answered. ? ?[name] his mother Matthew [name] Groundsiced understanding and appreciation.  ? ?Medication education handout and medication calendar given to patient. Patient knows to  call the office with questions or concerns. Oral Chemotherapy Clinic phone number provided to patient.  ? ?Matthew [name], BCPS ?Hematology/Oncology Clinical Pharmacist ?[facility] ?[phone]/05/2022 9:18 AM ? ? ?

## 2021-10-13 ENCOUNTER — Telehealth: Payer: Self-pay

## 2021-10-13 NOTE — Telephone Encounter (Signed)
-----   Message from Charleston Poot, RN sent at 10/10/2021  1:46 PM EST ----- ?Regarding: First Time/ Oxaliplatin/ Dr Burr Medico pt ?Hello, ? ?Pt had first time oxaliplatin today. Tolerated treatment well.  ? ?Thank you, ?Libbie K.  ? ?

## 2021-10-13 NOTE — Telephone Encounter (Signed)
LM for patient /mother to call back to the office at [phone] if he is having any issues or concerns from his treatment 10-11-19 with Oxaliplatin and or Capecitabine. ?

## 2021-10-14 ENCOUNTER — Other Ambulatory Visit (HOSPITAL_COMMUNITY): Payer: Self-pay

## 2021-10-17 ENCOUNTER — Encounter: Payer: Self-pay | Admitting: Hematology

## 2021-10-17 ENCOUNTER — Inpatient Hospital Stay (HOSPITAL_BASED_OUTPATIENT_CLINIC_OR_DEPARTMENT_OTHER): Payer: Medicaid Other | Admitting: Hematology

## 2021-10-17 ENCOUNTER — Inpatient Hospital Stay: Payer: Medicaid Other

## 2021-10-17 ENCOUNTER — Other Ambulatory Visit: Payer: Self-pay

## 2021-10-17 ENCOUNTER — Other Ambulatory Visit (HOSPITAL_COMMUNITY): Payer: Self-pay

## 2021-10-17 VITALS — BP 106/56 | HR 109 | Temp 98.5°F | Resp 18 | Wt 132.5 lb

## 2021-10-17 DIAGNOSIS — C186 Malignant neoplasm of descending colon: Secondary | ICD-10-CM | POA: Diagnosis not present

## 2021-10-17 DIAGNOSIS — Z5111 Encounter for antineoplastic chemotherapy: Secondary | ICD-10-CM

## 2021-10-17 LAB — CBC WITH DIFFERENTIAL (CANCER CENTER ONLY)
Abs Immature Granulocytes: 0.01 10*3/uL (ref 0.00–0.07)
Basophils Absolute: 0 10*3/uL (ref 0.0–0.1)
Basophils Relative: 0 %
Eosinophils Absolute: 0.1 10*3/uL (ref 0.0–0.5)
Eosinophils Relative: 2 %
HCT: 35.9 % — ABNORMAL LOW (ref 39.0–52.0)
Hemoglobin: 11.4 g/dL — ABNORMAL LOW (ref 13.0–17.0)
Immature Granulocytes: 0 %
Lymphocytes Relative: 19 %
Lymphs Abs: 0.8 10*3/uL (ref 0.7–4.0)
MCH: 25.5 pg — ABNORMAL LOW (ref 26.0–34.0)
MCHC: 31.8 g/dL (ref 30.0–36.0)
MCV: 80.3 fL (ref 80.0–100.0)
Monocytes Absolute: 0.2 10*3/uL (ref 0.1–1.0)
Monocytes Relative: 6 %
Neutro Abs: 3 10*3/uL (ref 1.7–7.7)
Neutrophils Relative %: 73 %
Platelet Count: 435 10*3/uL — ABNORMAL HIGH (ref 150–400)
RBC: 4.47 MIL/uL (ref 4.22–5.81)
RDW: 14.4 % (ref 11.5–15.5)
WBC Count: 4.1 10*3/uL (ref 4.0–10.5)
nRBC: 0 % (ref 0.0–0.2)

## 2021-10-17 LAB — IRON AND IRON BINDING CAPACITY (CC-WL,HP ONLY)
Iron: 111 ug/dL (ref 45–182)
Saturation Ratios: 23 % (ref 17.9–39.5)
TIBC: 484 ug/dL — ABNORMAL HIGH (ref 250–450)
UIBC: 373 ug/dL (ref 117–376)

## 2021-10-17 LAB — CMP (CANCER CENTER ONLY)
ALT: 9 U/L (ref 0–44)
AST: 13 U/L — ABNORMAL LOW (ref 15–41)
Albumin: 4.2 g/dL (ref 3.5–5.0)
Alkaline Phosphatase: 69 U/L (ref 38–126)
Anion gap: 8 (ref 5–15)
BUN: 11 mg/dL (ref 6–20)
CO2: 25 mmol/L (ref 22–32)
Calcium: 9.3 mg/dL (ref 8.9–10.3)
Chloride: 106 mmol/L (ref 98–111)
Creatinine: 1 mg/dL (ref 0.61–1.24)
GFR, Estimated: >60 mL/min (ref >60)
Glucose, Bld: 125 mg/dL — ABNORMAL HIGH (ref 70–99)
Potassium: 3.7 mmol/L (ref 3.5–5.1)
Sodium: 139 mmol/L (ref 135–145)
Total Bilirubin: 0.8 mg/dL (ref 0.3–1.2)
Total Protein: 7.6 g/dL (ref 6.5–8.1)

## 2021-10-17 LAB — FERRITIN: Ferritin: 89 ng/mL (ref 24–336)

## 2021-10-17 NOTE — Progress Notes (Signed)
?[facility]   ?[phone] [phone]   ?Clinic Follow up Note  ? ?Patient Care Team: ?[facility] as PCP - General ?[name], [name], MD as Consulting Physician (General Surgery) ?, , MD as Consulting Physician (Oncology) ?Beach, Karen A, RN as Oncology Nurse Navigator (Oncology) ? ?Date of Service:  10/17/2021 ? ?CHIEF COMPLAINT: f/u of colon cancer ? ?CURRENT THERAPY:  ?CAPOX, started 10/10/21 ? -Xeloda dose: 1500 mg BID on days 1-14 q21days ? ?ASSESSMENT & PLAN:  ?Matthew [name] is a 56 y.o. male with  ? ?1. Cancer of left colon, stage IIIB p(T3, N1aM0), MSS ?-presented to ED on 08/06/21 with recurrent upper abdominal pain. CT AP showed sigmoid colon perforation, with possible underlying malignancy. This was complicated with abdominal abscess, which required surgery and IV antibiotics.  ?-emergent partial colectomy on 08/08/21 by Dr. Conner showed invasive moderately differentiated adenocarcinoma. Margins negative, one lymph node showed metastatic carcinoma (1/12). ?-Staging chest CT 08/15/21 was negative for metastatic disease. ?-he began adjuvant CAPOX on 10/10/21. He is currently taking Xeloda 1500 mg BID 2 weeks on/1 week off. He tolerated cycle 1 well with no noticeable side effects thus far. ?-labs reviewed, overall stable. He will return in two weeks for next cycle. ?-plan for a total of 4 cycles  ?  ?2. Anemia, likely iron deficient ?-chronic prior to diagnosis, worsened with surgery ?-he is currently on oral iron. ?-overall mild now, hgb 11.4, iron panel and ferritin today overall WNL (10/17/21). ?  ?3. Schizophrenia, disabled, social support ?-he was diagnosed with schizophrenia in his 20's, managed with medication, he is on disability. ?-he lives with his mother; he is able to do ADLs for himself, but not iADLS. They are both on disability. His mother is also a poor historian. ?-he has a home care nurse that comes in. His mother seems to have  difficulty caring for his wound on her own. ?-Patient and mother met with oral chemotherapy pharmacist today and calender given on when to take Xeloda ?-Copy of schedule given to patient and his mother ?  ?  ?PLAN:  ?-continue Xeloda for one more week, then off for a week ?-lab, f/u, and C2 CAPOX in 2 weeks ? ? ?No problem-specific Assessment & Plan notes found for this encounter. ? ? ?SUMMARY OF ONCOLOGIC HISTORY: ?Oncology History Overview Note  ? Cancer Staging  ?Cancer of left colon (HCC) ?Staging form: Colon and Rectum, AJCC 8th Edition ?- Pathologic stage from 08/08/2021: Stage IIIB (pT3, pN1a, cM0) - Signed by , , MD on 08/26/2021 ? ?  ?Cancer of left colon (HCC)  ?04/01/2020 Imaging  ? IMPRESSION: ?1. Gallbladder decompressed bowel also partially calcified ?gallstones. No pericholecystic inflammation though if there is ?persisting clinical concern for cholecystitis right upper quadrant ?ultrasound could be obtained. ?2. Circumferential thickening of the distal thoracic esophagus. ?Could reflect features of esophagitis. Correlate with clinical ?symptoms and consider endoscopy as clinically warranted. ?3. Additional segmental thickening of the mid to distal sigmoid with ?focal narrowing. No acute surrounding inflammation or resulting ?obstruction. Findings are nonspecific, and could reflect sequela of ?prior inflammation/infection. However, recommend correlation with ?colonoscopy if not recently performed. ?4. Mild circumferential bladder wall thickening and indentation of ?the bladder base by an enlarged prostate. Possibly sequela of ?chronic outlet obstruction though could correlate with urinalysis to ?exclude cystitis. ?5. Aortic Atherosclerosis (ICD10-I70.0). ?  ?08/06/2021 Imaging  ? IMPRESSION: ?Sigmoid colonic perforation with small free intraperitoneal gas and ?infiltration of the mesenteric and omental   fat in keeping with ?changes of peritonitis. ?  ?Long segment inflammatory stranding of the sigmoid  colon in keeping with a severe infectious or inflammatory colitis. This terminates an area of irregular mural thickening, infiltrative soft tissue within the colonic mesentery, and focal dystrophic calcification. This may represent a chronic inflammatory process, however, a perforated malignancy could appear similarly. There are 2 separate points of perforation which again raise the question of an underlying malignancy. ?  ?7.1 cm gas and fluid containing pericolonic abscess within the ?sigmoid mesentery. ?  ?Marked inflammatory change of the terminal ileum adjacent to the ?pericolonic abscess with resultant small bowel obstruction. Fluid ?within the distal esophagus likely relates to gastroesophageal ?reflux the setting of vomiting. ?  ?Aortic Atherosclerosis (ICD10-I70.0). ?  ?08/08/2021 Cancer Staging  ? Staging form: Colon and Rectum, AJCC 8th Edition ?- Pathologic stage from 08/08/2021: Stage IIIB (pT3, pN1a, cM0) - Signed by Truitt Merle, MD on 08/26/2021 ?Stage prefix: Initial diagnosis ?Total positive nodes: 1 ?Histologic grading system: 4 grade system ?Histologic grade (G): G2 ?Residual tumor (R): R0 - None ? ?  ?08/08/2021 Definitive Surgery  ? FINAL MICROSCOPIC DIAGNOSIS:  ? ?A. COLON, SIGMOID, PARTIAL COLECTOMY:  ?- Invasive moderately differentiated adenocarcinoma.  ?- Metastatic carcinoma involving one of twelve lymph nodes (1/12).  ?- See oncology table below.  ? ?ADDENDUM:  ?Mismatch Repair Protein (IHC)  ?SUMMARY INTERPRETATION: NORMAL  ?  ?08/14/2021 Imaging  ? EXAM: ?CT ABDOMEN AND PELVIS WITH CONTRAST ? ?IMPRESSION: ?1. Post recent sigmoid colectomy with left lower quadrant colostomy. Two small residual foci of air within the pelvic mesentery with mild adjacent thickening, but no abscess or drainable collection. Trace non organized free fluid and stranding in the pelvis. ?2. Short segment of small bowel wall thickening and inflammation in the pelvis involving the distal ileum, likely reactive. ?3. Dilated  distal esophagus, stomach, and small bowel, without ?discrete transition point, favoring postoperative ileus. ?4. Small bilateral pleural effusions and compressive atelectasis. ?5. Heterogeneous partially enhancing 14 mm lymph node in the ?retroperitoneum at the aortoiliac bifurcation, not significantly ?changed from prior exam. Suspected additional lymph nodes in the ?anterior common iliac space, not significantly changed from prior ?exam. Recommend attention at follow-up. ?6. Additional chronic findings as described. ?  ?  ?08/15/2021 Imaging  ? EXAM: ?CT CHEST WITH CONTRAST ? ?IMPRESSION: ?1. No evidence of thoracic metastasis. ?2. Bilateral small layering pleural effusions with passive ?atelectasis ?  ?08/26/2021 Initial Diagnosis  ? Cancer of left colon [facility]) ?  ?10/10/2021 -  Chemotherapy  ? Patient is on Treatment Plan : COLORECTAL Xelox (Capeox) q21d  ?   ? ? ? ?INTERVAL HISTORY:  ?Montrail Mehrer is here for a follow up of colon cancer. He was last seen by PA Cassie on 10/10/21. He presents to the clinic accompanied by his mother. ?He reports he is doing well. He answered my questions with simple yes and no answers. He denies any side effects- no loss of appetite, skin issues, etc. His mother also denies any changes. ?  ?All other systems were reviewed with the patient and are negative. ? ?MEDICAL HISTORY:  ?Past Medical History:  ?Diagnosis Date  ? Schizophrenia ([place])   ? ? ?SURGICAL HISTORY: ?Past Surgical History:  ?Procedure Laterality Date  ? LAPAROTOMY N/A 08/08/2021  ? Procedure: EXPLORATORY LAPAROTOMY;  Surgeon: Clovis Riley, MD;  Location: [facility];  Service: General;  Laterality: N/A;  ? PARTIAL COLECTOMY N/A 08/08/2021  ? Procedure: PARTIAL COLECTOMY WITH COLOSTOMY;  Surgeon: Romana Juniper  A, MD;  Location: [facility] OR;  Service: General;  Laterality: N/A;  ? ? ?I have reviewed the social history and family history with the patient and they are unchanged from previous note. ? ?ALLERGIES:  has No Known  Allergies. ? ?MEDICATIONS:  ?Current Outpatient Medications  ?Medication Sig Dispense Refill  ? acetaminophen (TYLENOL) 500 MG tablet Take 2 tablets (1,000 mg total) by mouth every 6 (six) hours as needed f

## 2021-10-23 ENCOUNTER — Telehealth: Payer: Self-pay | Admitting: Pharmacist

## 2021-10-23 NOTE — Telephone Encounter (Addendum)
Oral Chemotherapy Pharmacist Encounter  ? ?Received phone call from patient's mother, Mihir Flanigan, regarding patient's Xeloda (capecitabine). ? ?Patient initially started capecitabine on 10/10/21. Patient took capecitabine 10/10/21-10/17/21, but had not taken it the capecitabine from 10/18/21 through 10/23/21 AM.  ? ?Patient's 7 days off was originally planned to start 10/24/21 - 10/30/21. Spoke with Dr. Burr Medico who would like patient to resume the capecitabine tonight (10/23/21 PM) and take through Sunday AM (10/26/21 AM). Patient will NOT take capecitabine 10/26/21 PM through 10/30/21. Patient has follow-up scheduled with MD on 10/31/21 for the start of cycle 2 of his capecitabine/oxaliplatin. ? ?Mother informed of the above and utilized teach back method to confirm she understood plans for capecitabine. She wrote the above information down. ? ?Patient's mother knows to call the office with questions or concerns. ? ?Leron Croak, PharmD, BCPS ?Hematology/Oncology Clinical Pharmacist ?[facility] Oral Chemotherapy Navigation Clinics ?[phone] ?10/23/2021 3:01 PM ? ?

## 2021-10-31 ENCOUNTER — Inpatient Hospital Stay: Payer: Medicaid Other

## 2021-10-31 ENCOUNTER — Other Ambulatory Visit: Payer: Self-pay

## 2021-10-31 ENCOUNTER — Encounter: Payer: Self-pay | Admitting: Hematology

## 2021-10-31 ENCOUNTER — Other Ambulatory Visit (HOSPITAL_COMMUNITY): Payer: Self-pay

## 2021-10-31 ENCOUNTER — Inpatient Hospital Stay (HOSPITAL_BASED_OUTPATIENT_CLINIC_OR_DEPARTMENT_OTHER): Payer: Medicaid Other | Admitting: Hematology

## 2021-10-31 VITALS — BP 100/76 | HR 97 | Temp 98.6°F | Resp 18 | Ht 69.0 in | Wt 136.0 lb

## 2021-10-31 DIAGNOSIS — C186 Malignant neoplasm of descending colon: Secondary | ICD-10-CM | POA: Diagnosis not present

## 2021-10-31 DIAGNOSIS — Z5111 Encounter for antineoplastic chemotherapy: Secondary | ICD-10-CM | POA: Diagnosis not present

## 2021-10-31 LAB — CMP (CANCER CENTER ONLY)
ALT: 33 U/L (ref 0–44)
AST: 31 U/L (ref 15–41)
Albumin: 3.9 g/dL (ref 3.5–5.0)
Alkaline Phosphatase: 78 U/L (ref 38–126)
Anion gap: 9 (ref 5–15)
BUN: 12 mg/dL (ref 6–20)
CO2: 25 mmol/L (ref 22–32)
Calcium: 9.1 mg/dL (ref 8.9–10.3)
Chloride: 110 mmol/L (ref 98–111)
Creatinine: 0.9 mg/dL (ref 0.61–1.24)
GFR, Estimated: >60 mL/min (ref >60)
Glucose, Bld: 107 mg/dL — ABNORMAL HIGH (ref 70–99)
Potassium: 3.7 mmol/L (ref 3.5–5.1)
Sodium: 144 mmol/L (ref 135–145)
Total Bilirubin: 0.7 mg/dL (ref 0.3–1.2)
Total Protein: 7.2 g/dL (ref 6.5–8.1)

## 2021-10-31 LAB — CBC WITH DIFFERENTIAL (CANCER CENTER ONLY)
Abs Immature Granulocytes: 0 10*3/uL (ref 0.00–0.07)
Basophils Absolute: 0 10*3/uL (ref 0.0–0.1)
Basophils Relative: 1 %
Eosinophils Absolute: 0.2 10*3/uL (ref 0.0–0.5)
Eosinophils Relative: 4 %
HCT: 33.8 % — ABNORMAL LOW (ref 39.0–52.0)
Hemoglobin: 10.9 g/dL — ABNORMAL LOW (ref 13.0–17.0)
Immature Granulocytes: 0 %
Lymphocytes Relative: 20 %
Lymphs Abs: 0.7 10*3/uL (ref 0.7–4.0)
MCH: 26.6 pg (ref 26.0–34.0)
MCHC: 32.2 g/dL (ref 30.0–36.0)
MCV: 82.4 fL (ref 80.0–100.0)
Monocytes Absolute: 0.2 10*3/uL (ref 0.1–1.0)
Monocytes Relative: 5 %
Neutro Abs: 2.6 10*3/uL (ref 1.7–7.7)
Neutrophils Relative %: 70 %
Platelet Count: 302 10*3/uL (ref 150–400)
RBC: 4.1 MIL/uL — ABNORMAL LOW (ref 4.22–5.81)
RDW: 17.1 % — ABNORMAL HIGH (ref 11.5–15.5)
WBC Count: 3.7 10*3/uL — ABNORMAL LOW (ref 4.0–10.5)
nRBC: 0 % (ref 0.0–0.2)

## 2021-10-31 MED ORDER — SODIUM CHLORIDE 0.9 % IV SOLN
10.0000 mg | Freq: Once | INTRAVENOUS | Status: AC
  Administered 2021-10-31: 10 mg via INTRAVENOUS
  Filled 2021-10-31: qty 10

## 2021-10-31 MED ORDER — CAPECITABINE 500 MG PO TABS
850.0000 mg/m2 | ORAL_TABLET | Freq: Two times a day (BID) | ORAL | 1 refills | Status: DC
  Filled 2021-10-31: qty 84, 14d supply, fill #0
  Filled 2021-11-21: qty 84, 21d supply, fill #1

## 2021-10-31 MED ORDER — DEXTROSE 5 % IV SOLN: Freq: Once | INTRAVENOUS | Status: AC

## 2021-10-31 MED ORDER — PALONOSETRON HCL INJECTION 0.25 MG/5ML
0.2500 mg | Freq: Once | INTRAVENOUS | Status: AC
  Administered 2021-10-31: 0.25 mg via INTRAVENOUS
  Filled 2021-10-31: qty 5

## 2021-10-31 MED ORDER — OXALIPLATIN CHEMO INJECTION 100 MG/20ML
130.0000 mg/m2 | Freq: Once | INTRAVENOUS | Status: AC
  Administered 2021-10-31: 220 mg via INTRAVENOUS
  Filled 2021-10-31: qty 40

## 2021-10-31 NOTE — Progress Notes (Signed)
?Matthew [name]   ?[phone] [phone]   ?Clinic Follow up Note  ? ?Patient Care Team: ?[facility] as PCP - General ?Clovis Riley, MD as Consulting Physician (General Surgery) ?Truitt Merle, MD as Consulting Physician (Oncology) ?Earl Gala, Deliah Goody, RN as Sales executive (Oncology) ? ?Date of Service:  10/31/2021 ? ?CHIEF COMPLAINT: f/u of colon cancer ? ?CURRENT THERAPY:  ?CAPOX, started 10/10/21 ?            -Xeloda dose: 1500 mg BID on days 1-14 q21days ? ?ASSESSMENT & PLAN:  ?Matthew [name] is a 56 y.o. male with  ? ?1. Cancer of left colon, stage IIIB p(T3, N1aM0), MSS ?-presented to ED on 08/06/21 with recurrent upper abdominal pain. CT AP showed sigmoid colon perforation, with possible underlying malignancy. This was complicated with abdominal abscess, which required surgery and IV antibiotics.  ?-emergent partial colectomy on 08/08/21 by Dr. Windle Guard showed invasive moderately differentiated adenocarcinoma. Margins negative, one lymph node showed metastatic carcinoma (1/12). ?-Staging chest CT 08/15/21 was negative for metastatic disease. ?-he began adjuvant CAPOX on 10/10/21. He is currently taking Xeloda 1500 mg BID 2 weeks on/1 week off. He tolerated cycle 1 well with no noticeable side effects thus far. ?-labs reviewed, overall stable. He will return in two weeks for next cycle. ?-plan for a total of 4 cycles  ?  ?2. Anemia, likely iron deficient ?-chronic prior to diagnosis, worsened with surgery ?-he is currently on oral iron. ?-overall mild now, hgb 10.9 today (10/31/21) ?  ?3. Schizophrenia, disabled, social support ?-he was diagnosed with schizophrenia in his 20's, managed with medication, he is on disability. ?-he lives with his mother; he is able to do ADLs for himself, but not iADLS. They are both on disability. His mother is also a poor historian. ?-he has a home care nurse that comes in. His mother seems to have difficulty caring for his  wound on her own. ?-Patient and mother met with oral chemotherapy pharmacist today and calender given on when to take Xeloda ?-Copy of schedule given to patient and his mother ?  ?  ?PLAN:  ?-proceed with C2 oxali today at full dose  ?-continue Xeloda at same dose  ?-lab, f/u, and C3 CAPOX in 3 weeks ? ? ?No problem-specific Assessment & Plan notes found for this encounter. ? ? ?SUMMARY OF ONCOLOGIC HISTORY: ?Oncology History Overview Note  ? Cancer Staging  ?Cancer of left colon ([place]) ?Staging form: Colon and Rectum, AJCC 8th Edition ?- Pathologic stage from 08/08/2021: Stage IIIB (pT3, pN1a, cM0) - Signed by Truitt Merle, MD on 08/26/2021 ? ?  ?Cancer of left colon [facility])  ?04/01/2020 Imaging  ? IMPRESSION: ?1. Gallbladder decompressed bowel also partially calcified ?gallstones. No pericholecystic inflammation though if there is ?persisting clinical concern for cholecystitis right upper quadrant ?ultrasound could be obtained. ?2. Circumferential thickening of the distal thoracic esophagus. ?Could reflect features of esophagitis. Correlate with clinical ?symptoms and consider endoscopy as clinically warranted. ?3. Additional segmental thickening of the mid to distal sigmoid with ?focal narrowing. No acute surrounding inflammation or resulting ?obstruction. Findings are nonspecific, and could reflect sequela of ?prior inflammation/infection. However, recommend correlation with ?colonoscopy if not recently performed. ?4. Mild circumferential bladder wall thickening and indentation of ?the bladder base by an enlarged prostate. Possibly sequela of ?chronic outlet obstruction though could correlate with urinalysis to ?exclude cystitis. ?5. Aortic Atherosclerosis (ICD10-I70.0). ?  ?08/06/2021 Imaging  ? IMPRESSION: ?Sigmoid colonic perforation with small free  intraperitoneal gas and ?infiltration of the mesenteric and omental fat in keeping with ?changes of peritonitis. ?  ?Long segment inflammatory stranding of the sigmoid colon  in keeping with a severe infectious or inflammatory colitis. This terminates an area of irregular mural thickening, infiltrative soft tissue within the colonic mesentery, and focal dystrophic calcification. This may represent a chronic inflammatory process, however, a perforated malignancy could appear similarly. There are 2 separate points of perforation which again raise the question of an underlying malignancy. ?  ?7.1 cm gas and fluid containing pericolonic abscess within the ?sigmoid mesentery. ?  ?Marked inflammatory change of the terminal ileum adjacent to the ?pericolonic abscess with resultant small bowel obstruction. Fluid ?within the distal esophagus likely relates to gastroesophageal ?reflux the setting of vomiting. ?  ?Aortic Atherosclerosis (ICD10-I70.0). ?  ?08/08/2021 Cancer Staging  ? Staging form: Colon and Rectum, AJCC 8th Edition ?- Pathologic stage from 08/08/2021: Stage IIIB (pT3, pN1a, cM0) - Signed by Truitt Merle, MD on 08/26/2021 ?Stage prefix: Initial diagnosis ?Total positive nodes: 1 ?Histologic grading system: 4 grade system ?Histologic grade (G): G2 ?Residual tumor (R): R0 - None ? ?  ?08/08/2021 Definitive Surgery  ? FINAL MICROSCOPIC DIAGNOSIS:  ? ?A. COLON, SIGMOID, PARTIAL COLECTOMY:  ?- Invasive moderately differentiated adenocarcinoma.  ?- Metastatic carcinoma involving one of twelve lymph nodes (1/12).  ?- See oncology table below.  ? ?ADDENDUM:  ?Mismatch Repair Protein ([facility])  ?SUMMARY INTERPRETATION: NORMAL  ?  ?08/14/2021 Imaging  ? EXAM: ?CT ABDOMEN AND PELVIS WITH CONTRAST ? ?IMPRESSION: ?1. Post recent sigmoid colectomy with left lower quadrant colostomy. Two small residual foci of air within the pelvic mesentery with mild adjacent thickening, but no abscess or drainable collection. Trace non organized free fluid and stranding in the pelvis. ?2. Short segment of small bowel wall thickening and inflammation in the pelvis involving the distal ileum, likely reactive. ?3. Dilated distal  esophagus, stomach, and small bowel, without ?discrete transition point, favoring postoperative ileus. ?4. Small bilateral pleural effusions and compressive atelectasis. ?5. Heterogeneous partially enhancing 14 mm lymph node in the ?retroperitoneum at the aortoiliac bifurcation, not significantly ?changed from prior exam. Suspected additional lymph nodes in the ?anterior common iliac space, not significantly changed from prior ?exam. Recommend attention at follow-up. ?6. Additional chronic findings as described. ?  ?  ?08/15/2021 Imaging  ? EXAM: ?CT CHEST WITH CONTRAST ? ?IMPRESSION: ?1. No evidence of thoracic metastasis. ?2. Bilateral small layering pleural effusions with passive ?atelectasis ?  ?08/26/2021 Initial Diagnosis  ? Cancer of left colon [facility]) ?  ?10/10/2021 -  Chemotherapy  ? Patient is on Treatment Plan : COLORECTAL Xelox ([place]) q21d  ?   ? ? ? ?INTERVAL HISTORY:  ?Matthew [name] is here for a follow up of colon cancer. He was last seen by me on 10/17/21. He presents to the clinic accompanied by his mother. ?He reports he continues to tolerate treatment well. He denies any nausea or other side effects. He appears well today. ?  ?All other systems were reviewed with the patient and are negative. ? ?MEDICAL HISTORY:  ?Past Medical History:  ?Diagnosis Date  ? Schizophrenia ([place])   ? ? ?SURGICAL HISTORY: ?Past Surgical History:  ?Procedure Laterality Date  ? LAPAROTOMY N/A 08/08/2021  ? Procedure: EXPLORATORY LAPAROTOMY;  Surgeon: Clovis Riley, MD;  Location: [facility];  Service: General;  Laterality: N/A;  ? PARTIAL COLECTOMY N/A 08/08/2021  ? Procedure: PARTIAL COLECTOMY WITH COLOSTOMY;  Surgeon: Clovis Riley, MD;  Location: [facility];  Service: General;  Laterality: N/A;  ? ? ?I have reviewed the social history and family history with the patient and they are unchanged from previous note. ? ?ALLERGIES:  has No Known Allergies. ? ?MEDICATIONS:  ?Current Outpatient Medications  ?Medication Sig Dispense  Refill  ? acetaminophen (TYLENOL) 500 MG tablet Take 2 tablets (1,000 mg total) by mouth every 6 (six) hours as needed for mild pain or moderate pain.  0  ? aspirin EC 81 MG tablet Take 81 mg by mouth dail

## 2021-10-31 NOTE — Patient Instructions (Signed)
[facility]  Discharge Instructions: ?Thank you for choosing [facility] to provide your oncology and hematology care.  ? ?If you have a lab appointment with the [facility], please go directly to the [facility] and check in at the registration area. ?  ?Wear comfortable clothing and clothing appropriate for easy access to any Portacath or PICC line.  ? ?We strive to give you quality time with your provider. You may need to reschedule your appointment if you arrive late (15 or more minutes).  Arriving late affects you and other patients whose appointments are after yours.  Also, if you miss three or more appointments without notifying the office, you may be dismissed from the clinic at the provider?s discretion.    ?  ?For prescription refill requests, have your pharmacy contact our office and allow 72 hours for refills to be completed.   ? ?Today you received the following chemotherapy and/or immunotherapy agents: Oxaliplatin    ?  ?To help prevent nausea and vomiting after your treatment, we encourage you to take your nausea medication as directed. ? ?BELOW ARE SYMPTOMS THAT SHOULD BE REPORTED IMMEDIATELY: ?*FEVER GREATER THAN 100.4 F (38 ?C) OR HIGHER ?*CHILLS OR SWEATING ?*NAUSEA AND VOMITING THAT IS NOT CONTROLLED WITH YOUR NAUSEA MEDICATION ?*UNUSUAL SHORTNESS OF BREATH ?*UNUSUAL BRUISING OR BLEEDING ?*URINARY PROBLEMS (pain or burning when urinating, or frequent urination) ?*BOWEL PROBLEMS (unusual diarrhea, constipation, pain near the anus) ?TENDERNESS IN MOUTH AND THROAT WITH OR WITHOUT PRESENCE OF ULCERS (sore throat, sores in mouth, or a toothache) ?UNUSUAL RASH, SWELLING OR PAIN  ?UNUSUAL VAGINAL DISCHARGE OR ITCHING  ? ?Items with * indicate a potential emergency and should be followed up as soon as possible or go to the Emergency Department if any problems should occur. ? ?Please show the CHEMOTHERAPY ALERT CARD or IMMUNOTHERAPY ALERT CARD at check-in  to the Emergency Department and triage nurse. ? ?Should you have questions after your visit or need to cancel or reschedule your appointment, please contact [facility]  Dept: [phone]  and follow the prompts.  Office hours are 8:00 a.m. to 4:30 p.m. Monday - Friday. Please note that voicemails left after 4:00 p.m. may not be returned until the following business day.  We are closed weekends and major holidays. You have access to a nurse at all times for urgent questions. Please call the main number to the clinic Dept: [phone] and follow the prompts. ? ? ?For any non-urgent questions, you may also contact your provider using MyChart. We now offer e-Visits for anyone 29 and older to request care online for non-urgent symptoms. For details visit [email or web address]. ?  ?Also download the MyChart app! Go to the app store, search "MyChart", open the app, select [facility], and log in with your MyChart username and password. ? ?Due to Covid, a mask is required upon entering the hospital/clinic. If you do not have a mask, one will be given to you upon arrival. For doctor visits, patients may have 1 support person aged 37 or older with them. For treatment visits, patients cannot have anyone with them due to current Covid guidelines and our immunocompromised population.  ? ?Oxaliplatin Injection ?What is this medication? ?OXALIPLATIN (ox AL i PLA tin) is a chemotherapy drug. It targets fast dividing cells, like cancer cells, and causes these cells to die. This medicine is used to treat cancers of the colon and rectum, and many other cancers. ?This medicine may  be used for other purposes; ask your health care provider or pharmacist if you have questions. ?COMMON BRAND NAME(S): Eloxatin ?What should I tell my care team before I take this medication? ?They need to know if you have any of these conditions: ?heart disease ?history of irregular heartbeat ?liver disease ?low blood  counts, like white cells, platelets, or red blood cells ?lung or breathing disease, like asthma ?take medicines that treat or prevent blood clots ?tingling of the fingers or toes, or other nerve disorder ?an unusual or allergic reaction to oxaliplatin, other chemotherapy, other medicines, foods, dyes, or preservatives ?pregnant or trying to get pregnant ?breast-feeding ?How should I use this medication? ?This drug is given as an infusion into a vein. It is administered in a hospital or clinic by a specially trained health care professional. ?Talk to your pediatrician regarding the use of this medicine in children. Special care may be needed. ?Overdosage: If you think you have taken too much of this medicine contact a poison control center or emergency room at once. ?NOTE: This medicine is only for you. Do not share this medicine with others. ?What if I miss a dose? ?It is important not to miss a dose. Call your doctor or health care professional if you are unable to keep an appointment. ?What may interact with this medication? ?Do not take this medicine with any of the following medications: ?cisapride ?dronedarone ?pimozide ?thioridazine ?This medicine may also interact with the following medications: ?aspirin and aspirin-like medicines ?certain medicines that treat or prevent blood clots like warfarin, apixaban, dabigatran, and rivaroxaban ?cisplatin ?cyclosporine ?diuretics ?medicines for infection like acyclovir, adefovir, amphotericin B, bacitracin, cidofovir, foscarnet, ganciclovir, gentamicin, pentamidine, vancomycin ?NSAIDs, medicines for pain and inflammation, like ibuprofen or naproxen ?other medicines that prolong the QT interval (an abnormal heart rhythm) ?pamidronate ?zoledronic acid ?This list may not describe all possible interactions. Give your health care provider a list of all the medicines, herbs, non-prescription drugs, or dietary supplements you use. Also tell them if you smoke, drink alcohol,  or use illegal drugs. Some items may interact with your medicine. ?What should I watch for while using this medication? ?Your condition will be monitored carefully while you are receiving this medicine. ?You may need blood work done while you are taking this medicine. ?This medicine may make you feel generally unwell. This is not uncommon as chemotherapy can affect healthy cells as well as cancer cells. Report any side effects. Continue your course of treatment even though you feel ill unless your healthcare professional tells you to stop. ?This medicine can make you more sensitive to cold. Do not drink cold drinks or use ice. Cover exposed skin before coming in contact with cold temperatures or cold objects. When out in cold weather wear warm clothing and cover your mouth and nose to warm the air that goes into your lungs. Tell your doctor if you get sensitive to the cold. ?Do not become pregnant while taking this medicine or for 9 months after stopping it. Women should inform their health care professional if they wish to become pregnant or think they might be pregnant. Men should not father a child while taking this medicine and for 6 months after stopping it. There is potential for serious side effects to an unborn child. Talk to your health care professional for more information. ?Do not breast-feed a child while taking this medicine or for 3 months after stopping it. ?This medicine has caused ovarian failure in some women. This medicine may make  it more difficult to get pregnant. Talk to your health care professional if you are concerned about your fertility. ?This medicine has caused decreased sperm counts in some men. This may make it more difficult to father a child. Talk to your health care professional if you are concerned about your fertility. ?This medicine may increase your risk of getting an infection. Call your health care professional for advice if you get a fever, chills, or sore throat, or other  symptoms of a cold or flu. Do not treat yourself. Try to avoid being around people who are sick. ?Avoid taking medicines that contain aspirin, acetaminophen, ibuprofen, naproxen, or ketoprofen unless instructe

## 2021-10-31 NOTE — Progress Notes (Signed)
Confirmed dose of oxaliplatin today at full dose of oxaliplatin at 130 mg/m2. ? ?T.O. Dr Lavonda Jumbo, PharmD ?

## 2021-11-04 ENCOUNTER — Other Ambulatory Visit (HOSPITAL_COMMUNITY): Payer: Self-pay

## 2021-11-18 NOTE — Progress Notes (Signed)
?[place]   ?[phone] [phone]   ?Clinic Follow up Note  ? ?Patient Care Team: ?Med[facility], [facility] as PCP - General ?Clovis Riley, MD as Consulting Physician (General Surgery) ?Truitt Merle, MD as Consulting Physician (Oncology) ?Earl Gala, Deliah Goody, RN as Sales executive (Oncology) ?11/19/2021 ? ?CHIEF COMPLAINT: Follow up colon cancer  ? ?SUMMARY OF ONCOLOGIC HISTORY: ?Oncology History Overview Note  ? Cancer Staging  ?Cancer of left colon ([place]) ?Staging form: Colon and Rectum, AJCC 8th Edition ?- Pathologic stage from 08/08/2021: Stage IIIB (pT3, pN1a, cM0) - Signed by Truitt Merle, MD on 08/26/2021 ? ?  ?Cancer of left colon [facility])  ?04/01/2020 Imaging  ? IMPRESSION: ?1. Gallbladder decompressed bowel also partially calcified ?gallstones. No pericholecystic inflammation though if there is ?persisting clinical concern for cholecystitis right upper quadrant ?ultrasound could be obtained. ?2. Circumferential thickening of the distal thoracic esophagus. ?Could reflect features of esophagitis. Correlate with clinical ?symptoms and consider endoscopy as clinically warranted. ?3. Additional segmental thickening of the mid to distal sigmoid with ?focal narrowing. No acute surrounding inflammation or resulting ?obstruction. Findings are nonspecific, and could reflect sequela of ?prior inflammation/infection. However, recommend correlation with ?colonoscopy if not recently performed. ?4. Mild circumferential bladder wall thickening and indentation of ?the bladder base by an enlarged prostate. Possibly sequela of ?chronic outlet obstruction though could correlate with urinalysis to ?exclude cystitis. ?5. Aortic Atherosclerosis (ICD10-I70.0). ?  ?08/06/2021 Imaging  ? IMPRESSION: ?Sigmoid colonic perforation with small free intraperitoneal gas and ?infiltration of the mesenteric and omental fat in keeping with ?changes of peritonitis. ?  ?Long segment inflammatory  stranding of the sigmoid colon in keeping with a severe infectious or inflammatory colitis. This terminates an area of irregular mural thickening, infiltrative soft tissue within the colonic mesentery, and focal dystrophic calcification. This may represent a chronic inflammatory process, however, a perforated malignancy could appear similarly. There are 2 separate points of perforation which again raise the question of an underlying malignancy. ?  ?7.1 cm gas and fluid containing pericolonic abscess within the ?sigmoid mesentery. ?  ?Marked inflammatory change of the terminal ileum adjacent to the ?pericolonic abscess with resultant small bowel obstruction. Fluid ?within the distal esophagus likely relates to gastroesophageal ?reflux the setting of vomiting. ?  ?Aortic Atherosclerosis (ICD10-I70.0). ?  ?08/08/2021 Cancer Staging  ? Staging form: Colon and Rectum, AJCC 8th Edition ?- Pathologic stage from 08/08/2021: Stage IIIB (pT3, pN1a, cM0) - Signed by Truitt Merle, MD on 08/26/2021 ?Stage prefix: Initial diagnosis ?Total positive nodes: 1 ?Histologic grading system: 4 grade system ?Histologic grade (G): G2 ?Residual tumor (R): R0 - None ? ?  ?08/08/2021 Definitive Surgery  ? FINAL MICROSCOPIC DIAGNOSIS:  ? ?A. COLON, SIGMOID, PARTIAL COLECTOMY:  ?- Invasive moderately differentiated adenocarcinoma.  ?- Metastatic carcinoma involving one of twelve lymph nodes (1/12).  ?- See oncology table below.  ? ?ADDENDUM:  ?Mismatch Repair Protein (IHC)  ?SUMMARY INTERPRETATION: NORMAL  ?  ?08/14/2021 Imaging  ? EXAM: ?CT ABDOMEN AND PELVIS WITH CONTRAST ? ?IMPRESSION: ?1. Post recent sigmoid colectomy with left lower quadrant colostomy. Two small residual foci of air within the pelvic mesentery with mild adjacent thickening, but no abscess or drainable collection. Trace non organized free fluid and stranding in the pelvis. ?2. Short segment of small bowel wall thickening and inflammation in the pelvis involving the distal ileum,  likely reactive. ?3. Dilated distal esophagus, stomach, and small bowel, without ?discrete transition point, favoring postoperative ileus. ?4. Small bilateral  pleural effusions and compressive atelectasis. ?5. Heterogeneous partially enhancing 14 mm lymph node in the ?retroperitoneum at the aortoiliac bifurcation, not significantly ?changed from prior exam. Suspected additional lymph nodes in the ?anterior common iliac space, not significantly changed from prior ?exam. Recommend attention at follow-up. ?6. Additional chronic findings as described. ?  ?  ?08/15/2021 Imaging  ? EXAM: ?CT CHEST WITH CONTRAST ? ?IMPRESSION: ?1. No evidence of thoracic metastasis. ?2. Bilateral small layering pleural effusions with passive ?atelectasis ?  ?08/26/2021 Initial Diagnosis  ? Cancer of left colon ([place]) ? ?  ?10/10/2021 -  Chemotherapy  ? Patient is on Treatment Plan : COLORECTAL Xelox (Capeox) q21d  ? ?  ?  ? ? ?CURRENT THERAPY: Adjuvant CAPOX, started 10/10/21; Xeloda dose 1500 mg BID. Plan for total of 4 cycles ? ?INTERVAL HISTORY: Mr. Mccarley returns accompanied by his mother for follow up and treatment as scheduled. Last seen by Dr. Burr Medico 10/31/21 and received cycle 2 Oxali. He presents today prior to cycle 3.  His mother notes he sleeps more after treatment and feels nauseous but does not vomit.  He is able to eat and drink well.  He is up and active at home.  Stool is soft when she changes the bag.  Patient denies cold sensitivity, neuropathy, nausea/vomiting, hand-foot syndrome, pain, fever, chills, cough, chest pain, dyspnea, or any other new complaints. ? ?All other systems were reviewed with the patient and are negative. ? ?MEDICAL HISTORY:  ?Past Medical History:  ?Diagnosis Date  ? Schizophrenia ([place])   ? ? ?SURGICAL HISTORY: ?Past Surgical History:  ?Procedure Laterality Date  ? LAPAROTOMY N/A 08/08/2021  ? Procedure: EXPLORATORY LAPAROTOMY;  Surgeon: Clovis Riley, MD;  Location: [place];  Service: General;  Laterality:  N/A;  ? PARTIAL COLECTOMY N/A 08/08/2021  ? Procedure: PARTIAL COLECTOMY WITH COLOSTOMY;  Surgeon: Clovis Riley, MD;  Location: [place];  Service: General;  Laterality: N/A;  ? ? ?I have reviewed the social history and family history with the patient and they are unchanged from previous note. ? ?ALLERGIES:  has No Known Allergies. ? ?MEDICATIONS:  ?Current Outpatient Medications  ?Medication Sig Dispense Refill  ? acetaminophen (TYLENOL) 500 MG tablet Take 2 tablets (1,000 mg total) by mouth every 6 (six) hours as needed for mild pain or moderate pain.  0  ? aspirin EC 81 MG tablet Take 81 mg by mouth daily as needed (for pain or headaches).     ? benztropine (COGENTIN) 1 MG tablet Take 1 mg at bedtime by mouth.    ? capecitabine (XELODA) 500 MG tablet Take 3 tablets (1,500 mg total) by mouth 2 (two) times daily after a meal. Take for 14 days, then none for 7 days. Start date: 10/10/21 84 tablet 1  ? cetirizine (ZYRTEC) 10 MG tablet Take 10 mg by mouth daily as needed for allergies.    ? ferrous sulfate 325 (65 FE) MG EC tablet Take 1 tablet (325 mg total) by mouth daily. 30 tablet 3  ? Multiple Vitamin (MULTIVITAMIN WITH MINERALS) TABS tablet Take 1 tablet by mouth daily.    ? ondansetron (ZOFRAN) 8 MG tablet Take 1 tablet (8 mg total) by mouth 2 (two) times daily as needed for refractory nausea / vomiting. Start on day 3 after chemotherapy. 30 tablet 1  ? paliperidone (INVEGA SUSTENNA) 156 MG/ML SUSP injection Inject 156 mg every 30 (thirty) days into the muscle.    ? polyethylene glycol (MIRALAX / GLYCOLAX) 17 g packet Take  17 g by mouth daily as needed for mild constipation or moderate constipation.  0  ? prochlorperazine (COMPAZINE) 10 MG tablet Take 1 tablet (10 mg total) by mouth every 6 (six) hours as needed (Nausea or vomiting). 30 tablet 1  ? ?No current facility-administered medications for this visit.  ? ? ?PHYSICAL EXAMINATION: ?ECOG PERFORMANCE STATUS: 1 - Symptomatic but completely  ambulatory ? ?Vitals:  ? 11/19/21 1046  ?BP: 103/81  ?Pulse: 83  ?Resp: 18  ?Temp: 99.3 ?F (37.4 ?C)  ?SpO2: 100%  ? ?Filed Weights  ? 11/19/21 1046  ?Weight: 135 lb 3.2 oz (61.3 kg)  ? ? ?GENERAL: no distress and comfortable ?

## 2021-11-19 ENCOUNTER — Inpatient Hospital Stay: Payer: Medicaid Other | Attending: Hematology

## 2021-11-19 ENCOUNTER — Encounter: Payer: Self-pay | Admitting: Nurse Practitioner

## 2021-11-19 ENCOUNTER — Inpatient Hospital Stay (HOSPITAL_BASED_OUTPATIENT_CLINIC_OR_DEPARTMENT_OTHER): Payer: Medicaid Other | Admitting: Nurse Practitioner

## 2021-11-19 ENCOUNTER — Inpatient Hospital Stay: Payer: Medicaid Other

## 2021-11-19 ENCOUNTER — Other Ambulatory Visit: Payer: Self-pay

## 2021-11-19 VITALS — BP 103/81 | HR 83 | Temp 99.3°F | Resp 18 | Ht 69.0 in | Wt 135.2 lb

## 2021-11-19 DIAGNOSIS — Z79899 Other long term (current) drug therapy: Secondary | ICD-10-CM | POA: Insufficient documentation

## 2021-11-19 DIAGNOSIS — C186 Malignant neoplasm of descending colon: Secondary | ICD-10-CM

## 2021-11-19 DIAGNOSIS — Z5111 Encounter for antineoplastic chemotherapy: Secondary | ICD-10-CM | POA: Insufficient documentation

## 2021-11-19 LAB — CBC WITH DIFFERENTIAL (CANCER CENTER ONLY)
Abs Immature Granulocytes: 0.01 10*3/uL (ref 0.00–0.07)
Basophils Absolute: 0 10*3/uL (ref 0.0–0.1)
Basophils Relative: 1 %
Eosinophils Absolute: 0.1 10*3/uL (ref 0.0–0.5)
Eosinophils Relative: 3 %
HCT: 30.4 % — ABNORMAL LOW (ref 39.0–52.0)
Hemoglobin: 10.1 g/dL — ABNORMAL LOW (ref 13.0–17.0)
Immature Granulocytes: 0 %
Lymphocytes Relative: 27 %
Lymphs Abs: 1 10*3/uL (ref 0.7–4.0)
MCH: 28.1 pg (ref 26.0–34.0)
MCHC: 33.2 g/dL (ref 30.0–36.0)
MCV: 84.7 fL (ref 80.0–100.0)
Monocytes Absolute: 0.4 10*3/uL (ref 0.1–1.0)
Monocytes Relative: 10 %
Neutro Abs: 2.1 10*3/uL (ref 1.7–7.7)
Neutrophils Relative %: 59 %
Platelet Count: 306 10*3/uL (ref 150–400)
RBC: 3.59 MIL/uL — ABNORMAL LOW (ref 4.22–5.81)
RDW: 21 % — ABNORMAL HIGH (ref 11.5–15.5)
WBC Count: 3.6 10*3/uL — ABNORMAL LOW (ref 4.0–10.5)
nRBC: 0 % (ref 0.0–0.2)

## 2021-11-19 LAB — CMP (CANCER CENTER ONLY)
ALT: 60 U/L — ABNORMAL HIGH (ref 0–44)
AST: 53 U/L — ABNORMAL HIGH (ref 15–41)
Albumin: 4.1 g/dL (ref 3.5–5.0)
Alkaline Phosphatase: 82 U/L (ref 38–126)
Anion gap: 8 (ref 5–15)
BUN: 15 mg/dL (ref 6–20)
CO2: 25 mmol/L (ref 22–32)
Calcium: 9.7 mg/dL (ref 8.9–10.3)
Chloride: 108 mmol/L (ref 98–111)
Creatinine: 0.82 mg/dL (ref 0.61–1.24)
GFR, Estimated: >60 mL/min (ref >60)
Glucose, Bld: 88 mg/dL (ref 70–99)
Potassium: 3.8 mmol/L (ref 3.5–5.1)
Sodium: 141 mmol/L (ref 135–145)
Total Bilirubin: 0.7 mg/dL (ref 0.3–1.2)
Total Protein: 7.3 g/dL (ref 6.5–8.1)

## 2021-11-19 MED ORDER — OXALIPLATIN CHEMO INJECTION 100 MG/20ML
130.0000 mg/m2 | Freq: Once | INTRAVENOUS | Status: AC
  Administered 2021-11-19: 220 mg via INTRAVENOUS
  Filled 2021-11-19: qty 40

## 2021-11-19 MED ORDER — DEXTROSE 5 % IV SOLN: Freq: Once | INTRAVENOUS | Status: AC

## 2021-11-19 MED ORDER — PALONOSETRON HCL INJECTION 0.25 MG/5ML
0.2500 mg | Freq: Once | INTRAVENOUS | Status: AC
  Administered 2021-11-19: 0.25 mg via INTRAVENOUS

## 2021-11-19 MED ORDER — SODIUM CHLORIDE 0.9 % IV SOLN
10.0000 mg | Freq: Once | INTRAVENOUS | Status: AC
  Administered 2021-11-19: 10 mg via INTRAVENOUS
  Filled 2021-11-19: qty 10

## 2021-11-19 NOTE — Patient Instructions (Signed)
[facility]  Discharge Instructions: ?Thank you for choosing [facility] to provide your oncology and hematology care.  ? ?If you have a lab appointment with the Cancer Center, please go directly to the Cancer Center and check in at the registration area. ?  ?Wear comfortable clothing and clothing appropriate for easy access to any Portacath or PICC line.  ? ?We strive to give you quality time with your provider. You may need to reschedule your appointment if you arrive late (15 or more minutes).  Arriving late affects you and other patients whose appointments are after yours.  Also, if you miss three or more appointments without notifying the office, you may be dismissed from the clinic at the provider?s discretion.    ?  ?For prescription refill requests, have your pharmacy contact our office and allow 72 hours for refills to be completed.   ? ?Today you received the following chemotherapy and/or immunotherapy agent: Oxaliplatin ?  ?To help prevent nausea and vomiting after your treatment, we encourage you to take your nausea medication as directed. ? ?BELOW ARE SYMPTOMS THAT SHOULD BE REPORTED IMMEDIATELY: ?*FEVER GREATER THAN 100.4 F (38 ?C) OR HIGHER ?*CHILLS OR SWEATING ?*NAUSEA AND VOMITING THAT IS NOT CONTROLLED WITH YOUR NAUSEA MEDICATION ?*UNUSUAL SHORTNESS OF BREATH ?*UNUSUAL BRUISING OR BLEEDING ?*URINARY PROBLEMS (pain or burning when urinating, or frequent urination) ?*BOWEL PROBLEMS (unusual diarrhea, constipation, pain near the anus) ?TENDERNESS IN MOUTH AND THROAT WITH OR WITHOUT PRESENCE OF ULCERS (sore throat, sores in mouth, or a toothache) ?UNUSUAL RASH, SWELLING OR PAIN  ?UNUSUAL VAGINAL DISCHARGE OR ITCHING  ? ?Items with * indicate a potential emergency and should be followed up as soon as possible or go to the Emergency Department if any problems should occur. ? ?Please show the CHEMOTHERAPY ALERT CARD or IMMUNOTHERAPY ALERT CARD at check-in to  the Emergency Department and triage nurse. ? ?Should you have questions after your visit or need to cancel or reschedule your appointment, please contact [facility]  Dept: [phone]  and follow the prompts.  Office hours are 8:00 a.m. to 4:30 p.m. Monday - Friday. Please note that voicemails left after 4:00 p.m. may not be returned until the following business day.  We are closed weekends and major holidays. You have access to a nurse at all times for urgent questions. Please call the main number to the clinic Dept: [phone] and follow the prompts. ? ? ?For any non-urgent questions, you may also contact your provider using MyChart. We now offer e-Visits for anyone 18 and older to request care online for non-urgent symptoms. For details visit [email or web address]. ?  ?Also download the MyChart app! Go to the app store, search "MyChart", open the app, select [place], and log in with your MyChart username and password. ? ?Due to Covid, a mask is required upon entering the hospital/clinic. If you do not have a mask, one will be given to you upon arrival. For doctor visits, patients may have 1 support person aged 18 or older with them. For treatment visits, patients cannot have anyone with them due to current Covid guidelines and our immunocompromised population.  ?

## 2021-11-21 ENCOUNTER — Other Ambulatory Visit (HOSPITAL_COMMUNITY): Payer: Self-pay

## 2021-12-09 ENCOUNTER — Other Ambulatory Visit: Payer: Self-pay | Admitting: Hematology

## 2021-12-09 ENCOUNTER — Other Ambulatory Visit (HOSPITAL_COMMUNITY): Payer: Self-pay

## 2021-12-09 DIAGNOSIS — C186 Malignant neoplasm of descending colon: Secondary | ICD-10-CM

## 2021-12-09 MED ORDER — CAPECITABINE 500 MG PO TABS
850.0000 mg/m2 | ORAL_TABLET | Freq: Two times a day (BID) | ORAL | 1 refills | Status: DC
  Filled 2021-12-09: qty 84, 21d supply, fill #0
  Filled 2021-12-24: qty 84, 21d supply, fill #1

## 2021-12-10 ENCOUNTER — Other Ambulatory Visit (HOSPITAL_COMMUNITY): Payer: Self-pay

## 2021-12-12 ENCOUNTER — Inpatient Hospital Stay: Payer: Medicaid Other

## 2021-12-12 ENCOUNTER — Other Ambulatory Visit: Payer: Self-pay

## 2021-12-12 ENCOUNTER — Inpatient Hospital Stay: Payer: Medicaid Other | Attending: Hematology | Admitting: Hematology

## 2021-12-12 ENCOUNTER — Encounter: Payer: Self-pay | Admitting: Hematology

## 2021-12-12 ENCOUNTER — Inpatient Hospital Stay (HOSPITAL_BASED_OUTPATIENT_CLINIC_OR_DEPARTMENT_OTHER): Payer: Medicaid Other

## 2021-12-12 VITALS — BP 102/69 | HR 78 | Temp 97.8°F | Resp 18 | Ht 69.0 in | Wt 135.2 lb

## 2021-12-12 DIAGNOSIS — Z5111 Encounter for antineoplastic chemotherapy: Secondary | ICD-10-CM | POA: Insufficient documentation

## 2021-12-12 DIAGNOSIS — Z79899 Other long term (current) drug therapy: Secondary | ICD-10-CM | POA: Diagnosis not present

## 2021-12-12 DIAGNOSIS — C186 Malignant neoplasm of descending colon: Secondary | ICD-10-CM | POA: Diagnosis present

## 2021-12-12 LAB — CBC WITH DIFFERENTIAL (CANCER CENTER ONLY)
Abs Immature Granulocytes: 0.01 10*3/uL (ref 0.00–0.07)
Basophils Absolute: 0 10*3/uL (ref 0.0–0.1)
Basophils Relative: 1 %
Eosinophils Absolute: 0.1 10*3/uL (ref 0.0–0.5)
Eosinophils Relative: 4 %
HCT: 31.7 % — ABNORMAL LOW (ref 39.0–52.0)
Hemoglobin: 10.8 g/dL — ABNORMAL LOW (ref 13.0–17.0)
Immature Granulocytes: 0 %
Lymphocytes Relative: 30 %
Lymphs Abs: 0.8 10*3/uL (ref 0.7–4.0)
MCH: 31.2 pg (ref 26.0–34.0)
MCHC: 34.1 g/dL (ref 30.0–36.0)
MCV: 91.6 fL (ref 80.0–100.0)
Monocytes Absolute: 0.3 10*3/uL (ref 0.1–1.0)
Monocytes Relative: 9 %
Neutro Abs: 1.5 10*3/uL — ABNORMAL LOW (ref 1.7–7.7)
Neutrophils Relative %: 56 %
Platelet Count: 163 10*3/uL (ref 150–400)
RBC: 3.46 MIL/uL — ABNORMAL LOW (ref 4.22–5.81)
RDW: 23.3 % — ABNORMAL HIGH (ref 11.5–15.5)
WBC Count: 2.7 10*3/uL — ABNORMAL LOW (ref 4.0–10.5)
nRBC: 0 % (ref 0.0–0.2)

## 2021-12-12 LAB — CMP (CANCER CENTER ONLY)
ALT: 88 U/L — ABNORMAL HIGH (ref 0–44)
AST: 80 U/L — ABNORMAL HIGH (ref 15–41)
Albumin: 4.1 g/dL (ref 3.5–5.0)
Alkaline Phosphatase: 154 U/L — ABNORMAL HIGH (ref 38–126)
Anion gap: 7 (ref 5–15)
BUN: 13 mg/dL (ref 6–20)
CO2: 26 mmol/L (ref 22–32)
Calcium: 9.5 mg/dL (ref 8.9–10.3)
Chloride: 106 mmol/L (ref 98–111)
Creatinine: 0.94 mg/dL (ref 0.61–1.24)
GFR, Estimated: >60 mL/min (ref >60)
Glucose, Bld: 96 mg/dL (ref 70–99)
Potassium: 3.8 mmol/L (ref 3.5–5.1)
Sodium: 139 mmol/L (ref 135–145)
Total Bilirubin: 0.8 mg/dL (ref 0.3–1.2)
Total Protein: 7.5 g/dL (ref 6.5–8.1)

## 2021-12-12 MED ORDER — DEXTROSE 5 % IV SOLN: Freq: Once | INTRAVENOUS | Status: AC

## 2021-12-12 MED ORDER — PALONOSETRON HCL INJECTION 0.25 MG/5ML
0.2500 mg | Freq: Once | INTRAVENOUS | Status: AC
  Administered 2021-12-12: 0.25 mg via INTRAVENOUS
  Filled 2021-12-12: qty 5

## 2021-12-12 MED ORDER — OXALIPLATIN CHEMO INJECTION 100 MG/20ML
110.0000 mg/m2 | Freq: Once | INTRAVENOUS | Status: AC
  Administered 2021-12-12: 190 mg via INTRAVENOUS
  Filled 2021-12-12: qty 38

## 2021-12-12 MED ORDER — HOT PACK MISC ONCOLOGY: 1.0000 | Freq: Once | Status: DC | PRN

## 2021-12-12 MED ORDER — SODIUM CHLORIDE 0.9 % IV SOLN
10.0000 mg | Freq: Once | INTRAVENOUS | Status: AC
  Administered 2021-12-12: 10 mg via INTRAVENOUS
  Filled 2021-12-12: qty 10

## 2021-12-12 NOTE — Progress Notes (Signed)
AST 80 and ALT 88 ?Per Dr. Ernestina Penna note ok to proceed with Oxaliplatin today w/ elevated liver enzymes.   ?

## 2021-12-12 NOTE — Progress Notes (Signed)
?[place]   ?Telephone:(336) [phone] [phone]   ?Clinic Follow up Note  ? ?Patient Care Team: ?Medicine, [facility] as PCP - General ?Clovis Riley, MD as Consulting Physician (General Surgery) ?Truitt Merle, MD as Consulting Physician (Oncology) ?Earl Gala, Deliah Goody, RN as Sales executive (Oncology) ? ?Date of Service:  12/12/2021 ? ?CHIEF COMPLAINT: f/u of colon cancer ? ?CURRENT THERAPY:  ?CAPOX, started 10/10/21 ?            -Xeloda dose: 1500 mg BID on days 1-14 q21days ? ?ASSESSMENT & PLAN:  ?Matthew [name] is a 56 y.o. male with  ? ?1. Cancer of left colon, stage IIIB p(T3, N1aM0), MSS ?-presented to ED on 08/06/21 with recurrent upper abdominal pain. CT AP showed sigmoid colon perforation, with possible underlying malignancy. This was complicated with abdominal abscess, which required surgery and IV antibiotics.  ?-emergent partial colectomy on 08/08/21 by Dr. Windle Guard showed invasive moderately differentiated adenocarcinoma. Margins negative, one lymph node showed metastatic carcinoma (1/12). ?-Staging chest CT 08/15/21 was negative for metastatic disease. ?-he began adjuvant CAPOX on 10/10/21. He is currently taking Xeloda 1500 mg BID 2 weeks on/1 week off. He has tolerated well with no noticeable side effects. ?-labs reviewed, liver enzymes are slightly elevated. Adequate to proceed with final oxali today. He has some mild sensation deficit on tuning fork exam today.  ?-we will move to surveillance after this cycle. Plan for a restaging scan in 2 months. ?  ?2. Anemia, likely iron deficient ?-chronic prior to diagnosis, worsened with surgery ?-he is currently on oral iron. ?-overall mild now, hgb 10.8 today (12/12/21) ?  ?3. Schizophrenia, disabled, social support ?-he was diagnosed with schizophrenia in his 20's, managed with medication, he is on disability. ?-he lives with his mother; he is able to do ADLs for himself, but not iADLS. They are both on disability. His  mother is also a poor historian. ?-he has a home care nurse that comes in.  ?  ?  ?PLAN:  ?-proceed with final oxali today at slightly reduced dose ?-lab and f/u in 3 months with restaging scan ? ? ?No problem-specific Assessment & Plan notes found for this encounter. ? ? ?SUMMARY OF ONCOLOGIC HISTORY: ?Oncology History Overview Note  ? Cancer Staging  ?Cancer of left colon ([place]) ?Staging form: Colon and Rectum, AJCC 8th Edition ?- Pathologic stage from 08/08/2021: Stage IIIB (pT3, pN1a, cM0) - Signed by Truitt Merle, MD on 08/26/2021 ? ?  ?Cancer of left colon [facility])  ?04/01/2020 Imaging  ? IMPRESSION: ?1. Gallbladder decompressed bowel also partially calcified ?gallstones. No pericholecystic inflammation though if there is ?persisting clinical concern for cholecystitis right upper quadrant ?ultrasound could be obtained. ?2. Circumferential thickening of the distal thoracic esophagus. ?Could reflect features of esophagitis. Correlate with clinical ?symptoms and consider endoscopy as clinically warranted. ?3. Additional segmental thickening of the mid to distal sigmoid with ?focal narrowing. No acute surrounding inflammation or resulting ?obstruction. Findings are nonspecific, and could reflect sequela of ?prior inflammation/infection. However, recommend correlation with ?colonoscopy if not recently performed. ?4. Mild circumferential bladder wall thickening and indentation of ?the bladder base by an enlarged prostate. Possibly sequela of ?chronic outlet obstruction though could correlate with urinalysis to ?exclude cystitis. ?5. Aortic Atherosclerosis (ICD10-I70.0). ?  ?08/06/2021 Imaging  ? IMPRESSION: ?Sigmoid colonic perforation with small free intraperitoneal gas and ?infiltration of the mesenteric and omental fat in keeping with ?changes of peritonitis. ?  ?Long segment inflammatory stranding of the sigmoid  colon in keeping with a severe infectious or inflammatory colitis. This terminates an area of irregular mural  thickening, infiltrative soft tissue within the colonic mesentery, and focal dystrophic calcification. This may represent a chronic inflammatory process, however, a perforated malignancy could appear similarly. There are 2 separate points of perforation which again raise the question of an underlying malignancy. ?  ?7.1 cm gas and fluid containing pericolonic abscess within the ?sigmoid mesentery. ?  ?Marked inflammatory change of the terminal ileum adjacent to the ?pericolonic abscess with resultant small bowel obstruction. Fluid ?within the distal esophagus likely relates to gastroesophageal ?reflux the setting of vomiting. ?  ?Aortic Atherosclerosis (ICD10-I70.0). ?  ?08/08/2021 Cancer Staging  ? Staging form: Colon and Rectum, AJCC 8th Edition ?- Pathologic stage from 08/08/2021: Stage IIIB (pT3, pN1a, cM0) - Signed by Truitt Merle, MD on 08/26/2021 ?Stage prefix: Initial diagnosis ?Total positive nodes: 1 ?Histologic grading system: 4 grade system ?Histologic grade (G): G2 ?Residual tumor (R): R0 - None ? ?  ?08/08/2021 Definitive Surgery  ? FINAL MICROSCOPIC DIAGNOSIS:  ? ?A. COLON, SIGMOID, PARTIAL COLECTOMY:  ?- Invasive moderately differentiated adenocarcinoma.  ?- Metastatic carcinoma involving one of twelve lymph nodes (1/12).  ?- See oncology table below.  ? ?ADDENDUM:  ?Mismatch Repair Protein (IHC)  ?SUMMARY INTERPRETATION: NORMAL  ?  ?08/14/2021 Imaging  ? EXAM: ?CT ABDOMEN AND PELVIS WITH CONTRAST ? ?IMPRESSION: ?1. Post recent sigmoid colectomy with left lower quadrant colostomy. Two small residual foci of air within the pelvic mesentery with mild adjacent thickening, but no abscess or drainable collection. Trace non organized free fluid and stranding in the pelvis. ?2. Short segment of small bowel wall thickening and inflammation in the pelvis involving the distal ileum, likely reactive. ?3. Dilated distal esophagus, stomach, and small bowel, without ?discrete transition point, favoring postoperative  ileus. ?4. Small bilateral pleural effusions and compressive atelectasis. ?5. Heterogeneous partially enhancing 14 mm lymph node in the ?retroperitoneum at the aortoiliac bifurcation, not significantly ?changed from prior exam. Suspected additional lymph nodes in the ?anterior common iliac space, not significantly changed from prior ?exam. Recommend attention at follow-up. ?6. Additional chronic findings as described. ?  ?  ?08/15/2021 Imaging  ? EXAM: ?CT CHEST WITH CONTRAST ? ?IMPRESSION: ?1. No evidence of thoracic metastasis. ?2. Bilateral small layering pleural effusions with passive ?atelectasis ?  ?08/26/2021 Initial Diagnosis  ? Cancer of left colon ([place]) ? ?  ?10/10/2021 -  Chemotherapy  ? Patient is on Treatment Plan : COLORECTAL Xelox (Capeox) q21d  ? ?   ? ? ? ?INTERVAL HISTORY:  ?Matthew [name] is here for a follow up of colon cancer. He was last seen by NP Lacie on 11/19/21. He presents to the clinic accompanied by his mother. ?He reports he is doing well overall. They report some numbness in his fingers; he denies fine motor deficits. ?  ?All other systems were reviewed with the patient and are negative. ? ?MEDICAL HISTORY:  ?Past Medical History:  ?Diagnosis Date  ? Schizophrenia ([place])   ? ? ?SURGICAL HISTORY: ?Past Surgical History:  ?Procedure Laterality Date  ? LAPAROTOMY N/A 08/08/2021  ? Procedure: EXPLORATORY LAPAROTOMY;  Surgeon: Clovis Riley, MD;  Location: [place];  Service: General;  Laterality: N/A;  ? PARTIAL COLECTOMY N/A 08/08/2021  ? Procedure: PARTIAL COLECTOMY WITH COLOSTOMY;  Surgeon: Clovis Riley, MD;  Location: [place];  Service: General;  Laterality: N/A;  ? ? ?I have reviewed the social history and family history with the patient and they  are unchanged from previous note. ? ?ALLERGIES:  has No Known Allergies. ? ?MEDICATIONS:  ?Current Outpatient Medications  ?Medication Sig Dispense Refill  ? acetaminophen (TYLENOL) 500 MG tablet Take 2 tablets (1,000 mg total) by mouth every 6  (six) hours as needed for mild pain or moderate pain.  0  ? aspirin EC 81 MG tablet Take 81 mg by mouth daily as needed (for pain or headaches).     ? benztropine (COGENTIN) 1 MG tablet Take 1 mg at bedtime by mout

## 2021-12-12 NOTE — Patient Instructions (Signed)
[facility]  Discharge Instructions: ?Thank you for choosing [facility] to provide your oncology and hematology care.  ? ?If you have a lab appointment with the Cancer Center, please go directly to the Cancer Center and check in at the registration area. ?  ?Wear comfortable clothing and clothing appropriate for easy access to any Portacath or PICC line.  ? ?We strive to give you quality time with your provider. You may need to reschedule your appointment if you arrive late (15 or more minutes).  Arriving late affects you and other patients whose appointments are after yours.  Also, if you miss three or more appointments without notifying the office, you may be dismissed from the clinic at the provider?s discretion.    ?  ?For prescription refill requests, have your pharmacy contact our office and allow 72 hours for refills to be completed.   ? ?Today you received the following chemotherapy and/or immunotherapy agent: Oxaliplatin ?  ?To help prevent nausea and vomiting after your treatment, we encourage you to take your nausea medication as directed. ? ?BELOW ARE SYMPTOMS THAT SHOULD BE REPORTED IMMEDIATELY: ?*FEVER GREATER THAN 100.4 F (38 ?C) OR HIGHER ?*CHILLS OR SWEATING ?*NAUSEA AND VOMITING THAT IS NOT CONTROLLED WITH YOUR NAUSEA MEDICATION ?*UNUSUAL SHORTNESS OF BREATH ?*UNUSUAL BRUISING OR BLEEDING ?*URINARY PROBLEMS (pain or burning when urinating, or frequent urination) ?*BOWEL PROBLEMS (unusual diarrhea, constipation, pain near the anus) ?TENDERNESS IN MOUTH AND THROAT WITH OR WITHOUT PRESENCE OF ULCERS (sore throat, sores in mouth, or a toothache) ?UNUSUAL RASH, SWELLING OR PAIN  ?UNUSUAL VAGINAL DISCHARGE OR ITCHING  ? ?Items with * indicate a potential emergency and should be followed up as soon as possible or go to the Emergency Department if any problems should occur. ? ?Please show the CHEMOTHERAPY ALERT CARD or IMMUNOTHERAPY ALERT CARD at check-in to  the Emergency Department and triage nurse. ? ?Should you have questions after your visit or need to cancel or reschedule your appointment, please contact [facility]  Dept: [phone]  and follow the prompts.  Office hours are 8:00 a.m. to 4:30 p.m. Monday - Friday. Please note that voicemails left after 4:00 p.m. may not be returned until the following business day.  We are closed weekends and major holidays. You have access to a nurse at all times for urgent questions. Please call the main number to the clinic Dept: [phone] and follow the prompts. ? ? ?For any non-urgent questions, you may also contact your provider using MyChart. We now offer e-Visits for anyone 18 and older to request care online for non-urgent symptoms. For details visit [email or web address]. ?  ?Also download the MyChart app! Go to the app store, search "MyChart", open the app, select [place], and log in with your MyChart username and password. ? ?Due to Covid, a mask is required upon entering the hospital/clinic. If you do not have a mask, one will be given to you upon arrival. For doctor visits, patients may have 1 support person aged 18 or older with them. For treatment visits, patients cannot have anyone with them due to current Covid guidelines and our immunocompromised population.  ?

## 2021-12-16 ENCOUNTER — Ambulatory Visit (HOSPITAL_COMMUNITY)
Admission: RE | Admit: 2021-12-16 | Discharge: 2021-12-16 | Disposition: A | Discharge status: Home / Self Care | Payer: Medicaid Other | Source: Ambulatory Visit | Attending: Nurse Practitioner | Admitting: Nurse Practitioner

## 2021-12-16 ENCOUNTER — Other Ambulatory Visit (HOSPITAL_COMMUNITY): Payer: Self-pay | Admitting: Nurse Practitioner

## 2021-12-16 DIAGNOSIS — Z933 Colostomy status: Secondary | ICD-10-CM | POA: Diagnosis present

## 2021-12-16 DIAGNOSIS — L24B3 Irritant contact dermatitis related to fecal or urinary stoma or fistula: Secondary | ICD-10-CM | POA: Insufficient documentation

## 2021-12-16 DIAGNOSIS — K9400 Colostomy complication, unspecified: Secondary | ICD-10-CM

## 2021-12-16 DIAGNOSIS — Z433 Encounter for attention to colostomy: Secondary | ICD-10-CM

## 2021-12-16 NOTE — Progress Notes (Signed)
[facility]   Reason for visit:  LMQ colostomy.  Patient has schizophrenia and is disabled.  Mother meets all aspects of his care. Patient has been home since 08/2021 and they have not arranged for management of obtaining supplies.  There has been a home health nurse visiting and teaching ostomy care to the mother. Will set up with [facility] to obtain supplies.  Mother reports that she struggles with emptying pouch and wants to remove pouch when full and replace.  We discuss needing to empty several times daily and medicaid will not provide enough pouches to change each time its 1/3 full and requiring emptying.   HPI:  Cancer of left colon with resection and colostomy ROS  Review of Systems  Gastrointestinal:        LLQ colostomy  Skin:  Positive for color change.       Erythema to peristomal skin  Psychiatric/Behavioral:         Schizophrenia, disabled, nonverbal  All other systems reviewed and are negative. Vital signs:  BP 105/77 (BP Location: Right Arm)   Pulse 85   Temp 98.4 F (36.9 C) (Oral)   Resp 18   SpO2 99%  Exam:  Physical Exam Vitals reviewed.  Abdominal:     Palpations: Abdomen is soft.  Skin:    General: Skin is warm.     Findings: No erythema.  Neurological:     Mental Status: He is alert.  Psychiatric:        Mood and Affect: Mood normal.    Stoma type/location:  LLQ colostomy Stomal assessment/size:  1 3/8" pink and moist, flush at 8 o'clock, leaks at this area.  Peristomal assessment:  erythematous and tender Treatment options for stomal/peristomal skin: stoma powder and skin prep with barrier ring Output: soft brown stool Ostomy pouching: 1pc.convex with barrier ring, stoma powder and skin prep Education provided:  Discussed emptying when 1/3 full and change pouch twice weekly. Mother performs care for patient    Impression/dx  Contact dermatitis colostomy Discussion  Education regarding emptying and changing pouch more often.  Plan  See  back as needed Order sent to [facility].     Visit time: 50 minutes.   Domenic Moras FNP-BC

## 2021-12-16 NOTE — Discharge Instructions (Addendum)
Will enroll with [facility]   [phone] ?Remove old pouch and cleanse with soap and water and pat dry.  ?Apply powder to skin around stoma and dust lightly.  Apply skin protectant to seal powder ?Apply barrier ring around stoma ?Cut pouch opening and apply pouch ?Empty when 1/3 full ?Change every 3 to 4 days and as needed if leaking.  ?Call clinic with questions or concerns ? ?

## 2021-12-17 ENCOUNTER — Telehealth: Payer: Self-pay | Admitting: Hematology

## 2021-12-17 NOTE — Telephone Encounter (Signed)
Left message with follow-up appointments per 5/12 los. ?

## 2021-12-24 ENCOUNTER — Other Ambulatory Visit (HOSPITAL_COMMUNITY): Payer: Self-pay

## 2021-12-25 ENCOUNTER — Other Ambulatory Visit (HOSPITAL_COMMUNITY): Payer: Self-pay

## 2022-02-05 ENCOUNTER — Ambulatory Visit (HOSPITAL_COMMUNITY)
Admission: RE | Admit: 2022-02-05 | Discharge: 2022-02-05 | Disposition: A | Discharge status: Home / Self Care | Payer: Medicaid Other | Source: Ambulatory Visit | Attending: Hematology | Admitting: Hematology

## 2022-02-05 ENCOUNTER — Inpatient Hospital Stay: Payer: Medicaid Other | Attending: Hematology

## 2022-02-05 ENCOUNTER — Telehealth: Payer: Self-pay | Admitting: Physician Assistant

## 2022-02-05 ENCOUNTER — Other Ambulatory Visit: Payer: Self-pay

## 2022-02-05 DIAGNOSIS — C186 Malignant neoplasm of descending colon: Secondary | ICD-10-CM | POA: Diagnosis present

## 2022-02-05 DIAGNOSIS — F209 Schizophrenia, unspecified: Secondary | ICD-10-CM | POA: Insufficient documentation

## 2022-02-05 DIAGNOSIS — Z9049 Acquired absence of other specified parts of digestive tract: Secondary | ICD-10-CM | POA: Insufficient documentation

## 2022-02-05 DIAGNOSIS — R918 Other nonspecific abnormal finding of lung field: Secondary | ICD-10-CM | POA: Insufficient documentation

## 2022-02-05 DIAGNOSIS — R7401 Elevation of levels of liver transaminase levels: Secondary | ICD-10-CM | POA: Insufficient documentation

## 2022-02-05 DIAGNOSIS — D649 Anemia, unspecified: Secondary | ICD-10-CM | POA: Insufficient documentation

## 2022-02-05 DIAGNOSIS — Z79899 Other long term (current) drug therapy: Secondary | ICD-10-CM | POA: Insufficient documentation

## 2022-02-05 LAB — CBC WITH DIFFERENTIAL (CANCER CENTER ONLY)
Abs Immature Granulocytes: 0.01 10*3/uL (ref 0.00–0.07)
Basophils Absolute: 0 10*3/uL (ref 0.0–0.1)
Basophils Relative: 1 %
Eosinophils Absolute: 0.2 10*3/uL (ref 0.0–0.5)
Eosinophils Relative: 4 %
HCT: 34.9 % — ABNORMAL LOW (ref 39.0–52.0)
Hemoglobin: 11.6 g/dL — ABNORMAL LOW (ref 13.0–17.0)
Immature Granulocytes: 0 %
Lymphocytes Relative: 29 %
Lymphs Abs: 1.1 10*3/uL (ref 0.7–4.0)
MCH: 30.6 pg (ref 26.0–34.0)
MCHC: 33.2 g/dL (ref 30.0–36.0)
MCV: 92.1 fL (ref 80.0–100.0)
Monocytes Absolute: 0.3 10*3/uL (ref 0.1–1.0)
Monocytes Relative: 8 %
Neutro Abs: 2.1 10*3/uL (ref 1.7–7.7)
Neutrophils Relative %: 58 %
Platelet Count: 187 10*3/uL (ref 150–400)
RBC: 3.79 MIL/uL — ABNORMAL LOW (ref 4.22–5.81)
RDW: 18 % — ABNORMAL HIGH (ref 11.5–15.5)
WBC Count: 3.7 10*3/uL — ABNORMAL LOW (ref 4.0–10.5)
nRBC: 0 % (ref 0.0–0.2)

## 2022-02-05 LAB — CMP (CANCER CENTER ONLY)
ALT: 110 U/L — ABNORMAL HIGH (ref 0–44)
AST: 109 U/L — ABNORMAL HIGH (ref 15–41)
Albumin: 4.1 g/dL (ref 3.5–5.0)
Alkaline Phosphatase: 285 U/L — ABNORMAL HIGH (ref 38–126)
Anion gap: 7 (ref 5–15)
BUN: 13 mg/dL (ref 6–20)
CO2: 26 mmol/L (ref 22–32)
Calcium: 9.5 mg/dL (ref 8.9–10.3)
Chloride: 107 mmol/L (ref 98–111)
Creatinine: 0.92 mg/dL (ref 0.61–1.24)
GFR, Estimated: >60 mL/min (ref >60)
Glucose, Bld: 86 mg/dL (ref 70–99)
Potassium: 3.8 mmol/L (ref 3.5–5.1)
Sodium: 140 mmol/L (ref 135–145)
Total Bilirubin: 0.8 mg/dL (ref 0.3–1.2)
Total Protein: 7.5 g/dL (ref 6.5–8.1)

## 2022-02-05 MED ORDER — IOHEXOL 300 MG/ML  SOLN
100.0000 mL | Freq: Once | INTRAMUSCULAR | Status: AC | PRN
  Administered 2022-02-05: 100 mL via INTRAVENOUS

## 2022-02-05 MED ORDER — SODIUM CHLORIDE (PF) 0.9 % IJ SOLN
INTRAMUSCULAR | Status: AC
  Filled 2022-02-05: qty 50

## 2022-02-05 NOTE — Telephone Encounter (Signed)
I called the patient's mother about his LFTs which are slightly elevated. She reports he is not taking any tylenol or drinking alcohol. Denies any unusual symptoms out of the ordinary. Denies abdominal pain. She reports he is eating good. Reviewed his appointments for Monday 02/09/22 with Dr. Burr Medico and to review his scan at that time that was performed today. The results are not available at this time.

## 2022-02-06 ENCOUNTER — Encounter (HOSPITAL_COMMUNITY): Payer: Self-pay | Admitting: Nurse Practitioner

## 2022-02-08 NOTE — Progress Notes (Unsigned)
[place]   Telephone:(336) [phone] Fax:(336) [phone]   Clinic Follow up Note   Patient Care Team: Medicine, [facility] as PCP - General Clovis Riley, MD as Consulting Physician (General Surgery) Truitt Merle, MD as Consulting Physician (Oncology) Royston Bake, RN as Oncology Nurse Navigator (Oncology)  Date of Service:  02/08/2022  CHIEF COMPLAINT: f/u of colon cancer  CURRENT THERAPY:  Surveillance   ASSESSMENT & PLAN:  Matthew [name] is a 56 y.o. male with   1. Cancer of left colon, stage IIIB p(T3, N1aM0), MSS -presented to ED on 08/06/21 with recurrent upper abdominal pain. CT AP showed sigmoid colon perforation, with possible underlying malignancy. This was complicated with abdominal abscess, which required surgery and IV antibiotics.  -emergent partial colectomy on 08/08/21 by Dr. Windle Guard showed invasive moderately differentiated adenocarcinoma. Margins negative, one lymph node showed metastatic carcinoma (1/12). -Staging chest CT 08/15/21 was negative for metastatic disease. -he began adjuvant CAPOX on 10/10/21 and completed on 12/25/2021 -Severity CT scan on February 05, 2022 showed multiple small bilateral pulmonary nodules, stable or slightly enlarged compared to previous CT scan in January 2023.  He also showed calcified aortocaval lymph nodes, unchanged but metastasis not ruled out.   2. Anemia, likely iron deficient -chronic prior to diagnosis, worsened with surgery -he is currently on oral iron. -overall mild now, hgb 10.8 today (12/12/21)   3. Schizophrenia, disabled, social support -he was diagnosed with schizophrenia in his 20's, managed with medication, he is on disability. -he lives with his mother; he is able to do ADLs for himself, but not iADLS. They are both on disability. His mother is also a poor historian. -he has a home care nurse that comes in.      PLAN:  -proceed with final oxali today at slightly reduced dose -   No  problem-specific Assessment & Plan notes found for this encounter.   SUMMARY OF ONCOLOGIC HISTORY: Oncology History Overview Note   Cancer Staging  Cancer of left colon [facility]) Staging form: Colon and Rectum, AJCC 8th Edition - Pathologic stage from 08/08/2021: Stage IIIB (pT3, pN1a, cM0) - Signed by Truitt Merle, MD on 08/26/2021    Cancer of left colon ([place])  04/01/2020 Imaging   IMPRESSION: 1. Gallbladder decompressed bowel also partially calcified gallstones. No pericholecystic inflammation though if there is persisting clinical concern for cholecystitis right upper quadrant ultrasound could be obtained. 2. Circumferential thickening of the distal thoracic esophagus. Could reflect features of esophagitis. Correlate with clinical symptoms and consider endoscopy as clinically warranted. 3. Additional segmental thickening of the mid to distal sigmoid with focal narrowing. No acute surrounding inflammation or resulting obstruction. Findings are nonspecific, and could reflect sequela of prior inflammation/infection. However, recommend correlation with colonoscopy if not recently performed. 4. Mild circumferential bladder wall thickening and indentation of the bladder base by an enlarged prostate. Possibly sequela of chronic outlet obstruction though could correlate with urinalysis to exclude cystitis. 5. Aortic Atherosclerosis (ICD10-I70.0).   08/06/2021 Imaging   IMPRESSION: Sigmoid colonic perforation with small free intraperitoneal gas and infiltration of the mesenteric and omental fat in keeping with changes of peritonitis.   Long segment inflammatory stranding of the sigmoid colon in keeping with a severe infectious or inflammatory colitis. This terminates an area of irregular mural thickening, infiltrative soft tissue within the colonic mesentery, and focal dystrophic calcification. This may represent a chronic inflammatory process, however, a perforated malignancy could appear  similarly. There are 2 separate points of perforation  which again raise the question of an underlying malignancy.   7.1 cm gas and fluid containing pericolonic abscess within the sigmoid mesentery.   Marked inflammatory change of the terminal ileum adjacent to the pericolonic abscess with resultant small bowel obstruction. Fluid within the distal esophagus likely relates to gastroesophageal reflux the setting of vomiting.   Aortic Atherosclerosis (ICD10-I70.0).   08/08/2021 Cancer Staging   Staging form: Colon and Rectum, AJCC 8th Edition - Pathologic stage from 08/08/2021: Stage IIIB (pT3, pN1a, cM0) - Signed by Truitt Merle, MD on 08/26/2021 Stage prefix: Initial diagnosis Total positive nodes: 1 Histologic grading system: 4 grade system Histologic grade (G): G2 Residual tumor (R): R0 - None   08/08/2021 Definitive Surgery   FINAL MICROSCOPIC DIAGNOSIS:   A. COLON, SIGMOID, PARTIAL COLECTOMY:  - Invasive moderately differentiated adenocarcinoma.  - Metastatic carcinoma involving one of twelve lymph nodes (1/12).  - See oncology table below.   ADDENDUM:  Mismatch Repair Protein (IHC)  SUMMARY INTERPRETATION: NORMAL    08/14/2021 Imaging   EXAM: CT ABDOMEN AND PELVIS WITH CONTRAST  IMPRESSION: 1. Post recent sigmoid colectomy with left lower quadrant colostomy. Two small residual foci of air within the pelvic mesentery with mild adjacent thickening, but no abscess or drainable collection. Trace non organized free fluid and stranding in the pelvis. 2. Short segment of small bowel wall thickening and inflammation in the pelvis involving the distal ileum, likely reactive. 3. Dilated distal esophagus, stomach, and small bowel, without discrete transition point, favoring postoperative ileus. 4. Small bilateral pleural effusions and compressive atelectasis. 5. Heterogeneous partially enhancing 14 mm lymph node in the retroperitoneum at the aortoiliac bifurcation, not significantly changed  from prior exam. Suspected additional lymph nodes in the anterior common iliac space, not significantly changed from prior exam. Recommend attention at follow-up. 6. Additional chronic findings as described.     08/15/2021 Imaging   EXAM: CT CHEST WITH CONTRAST  IMPRESSION: 1. No evidence of thoracic metastasis. 2. Bilateral small layering pleural effusions with passive atelectasis   08/26/2021 Initial Diagnosis   Cancer of left colon ([place])   10/10/2021 -  Chemotherapy   Patient is on Treatment Plan : COLORECTAL Xelox (Capeox) q21d        INTERVAL HISTORY:  Matthew [name] is here for a follow up of colon cancer. He was last seen by NP Lacie on 11/19/21. He presents to the clinic accompanied by his mother. He reports he is doing well overall. They report some numbness in his fingers; he denies fine motor deficits.   All other systems were reviewed with the patient and are negative.  MEDICAL HISTORY:  Past Medical History:  Diagnosis Date   Schizophrenia [facility])     SURGICAL HISTORY: Past Surgical History:  Procedure Laterality Date   LAPAROTOMY N/A 08/08/2021   Procedure: EXPLORATORY LAPAROTOMY;  Surgeon: Clovis Riley, MD;  Location: [place];  Service: General;  Laterality: N/A;   PARTIAL COLECTOMY N/A 08/08/2021   Procedure: PARTIAL COLECTOMY WITH COLOSTOMY;  Surgeon: Clovis Riley, MD;  Location: [place];  Service: General;  Laterality: N/A;    I have reviewed the social history and family history with the patient and they are unchanged from previous note.  ALLERGIES:  has No Known Allergies.  MEDICATIONS:  Current Outpatient Medications  Medication Sig Dispense Refill   acetaminophen (TYLENOL) 500 MG tablet Take 2 tablets (1,000 mg total) by mouth every 6 (six) hours as needed for mild pain or moderate pain.  0  aspirin EC 81 MG tablet Take 81 mg by mouth daily as needed (for pain or headaches).      benztropine (COGENTIN) 1 MG tablet Take 1 mg at bedtime by mouth.      capecitabine (XELODA) 500 MG tablet Take 3 tablets (1,500 mg total) by mouth 2 (two) times daily after a meal. Take for 14 days, then none for 7 days. Start date: 10/10/21 84 tablet 1   cetirizine (ZYRTEC) 10 MG tablet Take 10 mg by mouth daily as needed for allergies.     ferrous sulfate 325 (65 FE) MG EC tablet Take 1 tablet (325 mg total) by mouth daily. 30 tablet 3   Multiple Vitamin (MULTIVITAMIN WITH MINERALS) TABS tablet Take 1 tablet by mouth daily.     ondansetron (ZOFRAN) 8 MG tablet Take 1 tablet (8 mg total) by mouth 2 (two) times daily as needed for refractory nausea / vomiting. Start on day 3 after chemotherapy. 30 tablet 1   paliperidone (INVEGA SUSTENNA) 156 MG/ML SUSP injection Inject 156 mg every 30 (thirty) days into the muscle.     polyethylene glycol (MIRALAX / GLYCOLAX) 17 g packet Take 17 g by mouth daily as needed for mild constipation or moderate constipation.  0   prochlorperazine (COMPAZINE) 10 MG tablet Take 1 tablet (10 mg total) by mouth every 6 (six) hours as needed (Nausea or vomiting). 30 tablet 1   No current facility-administered medications for this visit.    PHYSICAL EXAMINATION: ECOG PERFORMANCE STATUS: 1 - Symptomatic but completely ambulatory  There were no vitals filed for this visit.  Wt Readings from Last 3 Encounters:  12/12/21 135 lb 3.2 oz (61.3 kg)  11/19/21 135 lb 3.2 oz (61.3 kg)  10/31/21 136 lb (61.7 kg)     GENERAL:alert, no distress and comfortable SKIN: skin color normal, no rashes or significant lesions EYES: normal, Conjunctiva are pink and non-injected, sclera clear  NEURO: alert & oriented x 3 with fluent speech  LABORATORY DATA:  I have reviewed the data as listed    Latest Ref Rng & Units 02/05/2022    9:23 AM 12/12/2021   10:12 AM 11/19/2021   10:13 AM  CBC  WBC 4.0 - 10.5 K/uL 3.7  2.7  3.6   Hemoglobin 13.0 - 17.0 g/dL 11.6  10.8  10.1   Hematocrit 39.0 - 52.0 % 34.9  31.7  30.4   Platelets 150 - 400 K/uL 187  163   306         Latest Ref Rng & Units 02/05/2022    9:23 AM 12/12/2021   10:12 AM 11/19/2021   10:13 AM  CMP  Glucose 70 - 99 mg/dL 86  96  88   BUN 6 - 20 mg/dL 13  13  15    Creatinine 0.61 - 1.24 mg/dL 0.92  0.94  0.82   Sodium 135 - 145 mmol/L 140  139  141   Potassium 3.5 - 5.1 mmol/L 3.8  3.8  3.8   Chloride 98 - 111 mmol/L 107  106  108   CO2 22 - 32 mmol/L 26  26  25    Calcium 8.9 - 10.3 mg/dL 9.5  9.5  9.7   Total Protein 6.5 - 8.1 g/dL 7.5  7.5  7.3   Total Bilirubin 0.3 - 1.2 mg/dL 0.8  0.8  0.7   Alkaline Phos 38 - 126 U/L 285  154  82   AST 15 - 41 U/L 109  80  53   ALT 0 - 44 U/L 110  88  60       RADIOGRAPHIC STUDIES: I have personally reviewed the radiological images as listed and agreed with the findings in the report. No results found.    No orders of the defined types were placed in this encounter.  All questions were answered. The patient knows to call the clinic with any problems, questions or concerns. No barriers to learning was detected. The total time spent in the appointment was 30 minutes.     Truitt Merle, MD 02/08/2022

## 2022-02-09 ENCOUNTER — Other Ambulatory Visit: Payer: Self-pay

## 2022-02-09 ENCOUNTER — Inpatient Hospital Stay: Payer: Medicaid Other

## 2022-02-09 ENCOUNTER — Other Ambulatory Visit (HOSPITAL_COMMUNITY): Payer: Self-pay

## 2022-02-09 ENCOUNTER — Inpatient Hospital Stay (HOSPITAL_BASED_OUTPATIENT_CLINIC_OR_DEPARTMENT_OTHER): Payer: Medicaid Other | Admitting: Hematology

## 2022-02-09 ENCOUNTER — Encounter: Payer: Self-pay | Admitting: Hematology

## 2022-02-09 VITALS — BP 93/65 | HR 81 | Temp 98.6°F | Resp 19 | Ht 69.0 in | Wt 142.3 lb

## 2022-02-09 DIAGNOSIS — Z9049 Acquired absence of other specified parts of digestive tract: Secondary | ICD-10-CM | POA: Diagnosis not present

## 2022-02-09 DIAGNOSIS — R7401 Elevation of levels of liver transaminase levels: Secondary | ICD-10-CM | POA: Diagnosis not present

## 2022-02-09 DIAGNOSIS — F209 Schizophrenia, unspecified: Secondary | ICD-10-CM | POA: Diagnosis not present

## 2022-02-09 DIAGNOSIS — C186 Malignant neoplasm of descending colon: Secondary | ICD-10-CM | POA: Diagnosis present

## 2022-02-09 DIAGNOSIS — D649 Anemia, unspecified: Secondary | ICD-10-CM | POA: Diagnosis not present

## 2022-02-09 DIAGNOSIS — R918 Other nonspecific abnormal finding of lung field: Secondary | ICD-10-CM | POA: Diagnosis not present

## 2022-02-09 DIAGNOSIS — Z79899 Other long term (current) drug therapy: Secondary | ICD-10-CM | POA: Diagnosis not present

## 2022-02-10 ENCOUNTER — Telehealth: Payer: Self-pay | Admitting: Hematology

## 2022-02-10 NOTE — Telephone Encounter (Signed)
Scheduled follow-up appointments per 7/10 los. Patient's mother is aware.

## 2022-02-13 ENCOUNTER — Encounter (HOSPITAL_COMMUNITY): Payer: Self-pay | Admitting: Nurse Practitioner

## 2022-02-23 ENCOUNTER — Other Ambulatory Visit: Payer: Self-pay

## 2022-02-23 ENCOUNTER — Inpatient Hospital Stay: Payer: Medicaid Other

## 2022-02-23 DIAGNOSIS — C186 Malignant neoplasm of descending colon: Secondary | ICD-10-CM

## 2022-02-23 LAB — CBC WITH DIFFERENTIAL (CANCER CENTER ONLY)
Abs Immature Granulocytes: 0 10*3/uL (ref 0.00–0.07)
Basophils Absolute: 0 10*3/uL (ref 0.0–0.1)
Basophils Relative: 1 %
Eosinophils Absolute: 0.2 10*3/uL (ref 0.0–0.5)
Eosinophils Relative: 5 %
HCT: 36.2 % — ABNORMAL LOW (ref 39.0–52.0)
Hemoglobin: 12.4 g/dL — ABNORMAL LOW (ref 13.0–17.0)
Immature Granulocytes: 0 %
Lymphocytes Relative: 24 %
Lymphs Abs: 1 10*3/uL (ref 0.7–4.0)
MCH: 30.8 pg (ref 26.0–34.0)
MCHC: 34.3 g/dL (ref 30.0–36.0)
MCV: 89.8 fL (ref 80.0–100.0)
Monocytes Absolute: 0.3 10*3/uL (ref 0.1–1.0)
Monocytes Relative: 7 %
Neutro Abs: 2.6 10*3/uL (ref 1.7–7.7)
Neutrophils Relative %: 63 %
Platelet Count: 212 10*3/uL (ref 150–400)
RBC: 4.03 MIL/uL — ABNORMAL LOW (ref 4.22–5.81)
RDW: 15.6 % — ABNORMAL HIGH (ref 11.5–15.5)
WBC Count: 4.1 10*3/uL (ref 4.0–10.5)
nRBC: 0 % (ref 0.0–0.2)

## 2022-02-23 LAB — CMP (CANCER CENTER ONLY)
ALT: 40 U/L (ref 0–44)
AST: 43 U/L — ABNORMAL HIGH (ref 15–41)
Albumin: 4.3 g/dL (ref 3.5–5.0)
Alkaline Phosphatase: 213 U/L — ABNORMAL HIGH (ref 38–126)
Anion gap: 6 (ref 5–15)
BUN: 17 mg/dL (ref 6–20)
CO2: 26 mmol/L (ref 22–32)
Calcium: 9.6 mg/dL (ref 8.9–10.3)
Chloride: 108 mmol/L (ref 98–111)
Creatinine: 0.99 mg/dL (ref 0.61–1.24)
GFR, Estimated: >60 mL/min (ref >60)
Glucose, Bld: 97 mg/dL (ref 70–99)
Potassium: 3.9 mmol/L (ref 3.5–5.1)
Sodium: 140 mmol/L (ref 135–145)
Total Bilirubin: 0.8 mg/dL (ref 0.3–1.2)
Total Protein: 7.4 g/dL (ref 6.5–8.1)

## 2022-02-24 ENCOUNTER — Encounter (HOSPITAL_COMMUNITY): Payer: Self-pay | Admitting: Nurse Practitioner

## 2022-02-26 ENCOUNTER — Telehealth: Payer: Self-pay

## 2022-02-26 NOTE — Progress Notes (Signed)
This nurse called patient's mother and left a message provided date and time for patient's PET scan.  Provided prep instructions and arrival time.  She knows to call clinic if there are any questions.

## 2022-02-26 NOTE — Telephone Encounter (Signed)
-----   Message from Truitt Merle, MD sent at 02/24/2022  9:01 AM EDT ----- Please let pt know his lab results from 7/24, his LFTs is much better. If he does not have any GI symptoms, OK to repeat lab on next scheduled time, and make sure he does not drink alcohol, thanks   Truitt Merle

## 2022-02-26 NOTE — Telephone Encounter (Signed)
This nurse spoke with his mother and made her aware of lab results and provider recommendations.  Verified that patient has been experiencing any GI symptoms which she has denied.  States all he does is sleep now days.  Advised that provider wants to make sure that he keeps his next scheduled appointments.  Made patient aware of appointments.  Advised that patient has an order for a PET scan and this nurse will call back with a scheduled date and time.  No further questions or concerns at this time.

## 2022-02-27 ENCOUNTER — Encounter: Payer: Self-pay | Admitting: Hematology

## 2022-02-27 NOTE — Telephone Encounter (Signed)
This nurse called patient's mother and left a message provided date and time for patient's PET scan.  Provided prep instructions and arrival time.  She knows to call clinic if there are any questions.

## 2022-03-03 ENCOUNTER — Other Ambulatory Visit: Payer: Self-pay

## 2022-03-12 ENCOUNTER — Inpatient Hospital Stay: Payer: Medicaid Other | Attending: Hematology

## 2022-03-12 ENCOUNTER — Telehealth: Payer: Self-pay

## 2022-03-12 NOTE — Telephone Encounter (Signed)
Pt's mom (Hatie Raver) came to [facility] today stating someone called her to pick up supplies.  When asked by receptionist in lobby what supplies, pt's mother did not know.  After long research by receptionist and Alferd Apa, LPN, it was discovered that the pt's mother was here to pick-up ostomy supplies.  Joy went to lobby to speak with pt's mother to give her the telephone number again to the [facility] and directions to the [facility].  This information has been given to pt's mother on numerous visits and telephone calls in the past.  Pt's mom appears to be very forgetful and confused.  Pt's last office visit with Dr. Burr Medico it was discovered the pt was still taking Capecitabine which was to be taken while pt was receiving radiation.  Pt's radiation was completed several weeks prior to the f/u appt with Dr. Burr Medico.  This RN and Leron Croak, PharmD spoke with [facility] regarding Capecitabine regarding amount dispensed for radiation.  [facility] confirmed dose and dispensed amount was as written by Dr. Burr Medico with no refills given.  Based on this information, it is suspected that the pt was not given the Capecitabine correctly by the pt's mother.  There are numerous incidents of confusion from pt's mother.  Today, pt's mother used [facility] hospital transportation which is for pt's appts to come up to the [place] to pickup ostomy supplies which was arranged by the ostomy clinic nurse to be delivered to the pt's home.  Pt's mom does not care for pt's ostomy as instructed by the ostomy clinic nor ostomy nurse.  Hospital transportation was arranged for pt's mother to return home.  Unfortunately, pt's mother got into the wrong car that was for another patient.  Tried contacting the pt's mother's cellphone but she did not answer.  After todays incidents, pt's mother may not be mentally capable of continuing to manage/care for the patient.  Notified [place] Social Worker - Trilby Leaver, MSW, LCSW  regarding possible harmful risk of pt's home environment.

## 2022-03-24 ENCOUNTER — Encounter: Payer: Self-pay | Admitting: Hematology

## 2022-03-24 NOTE — Progress Notes (Signed)
[place] called to confirm address for appointments for 9/6 and 9/8.

## 2022-04-08 ENCOUNTER — Ambulatory Visit (HOSPITAL_COMMUNITY)
Admission: RE | Admit: 2022-04-08 | Discharge: 2022-04-08 | Disposition: A | Discharge status: Home / Self Care | Payer: Medicaid Other | Source: Ambulatory Visit | Attending: Hematology | Admitting: Hematology

## 2022-04-08 ENCOUNTER — Other Ambulatory Visit: Payer: Self-pay

## 2022-04-08 ENCOUNTER — Inpatient Hospital Stay: Payer: Medicaid Other | Attending: Hematology

## 2022-04-08 DIAGNOSIS — C186 Malignant neoplasm of descending colon: Secondary | ICD-10-CM | POA: Diagnosis present

## 2022-04-08 DIAGNOSIS — F209 Schizophrenia, unspecified: Secondary | ICD-10-CM | POA: Insufficient documentation

## 2022-04-08 DIAGNOSIS — R7401 Elevation of levels of liver transaminase levels: Secondary | ICD-10-CM | POA: Insufficient documentation

## 2022-04-08 DIAGNOSIS — D649 Anemia, unspecified: Secondary | ICD-10-CM | POA: Insufficient documentation

## 2022-04-08 LAB — CMP (CANCER CENTER ONLY)
ALT: 64 U/L — ABNORMAL HIGH (ref 0–44)
AST: 46 U/L — ABNORMAL HIGH (ref 15–41)
Albumin: 4.3 g/dL (ref 3.5–5.0)
Alkaline Phosphatase: 182 U/L — ABNORMAL HIGH (ref 38–126)
Anion gap: 8 (ref 5–15)
BUN: 13 mg/dL (ref 6–20)
CO2: 23 mmol/L (ref 22–32)
Calcium: 9 mg/dL (ref 8.9–10.3)
Chloride: 109 mmol/L (ref 98–111)
Creatinine: 0.99 mg/dL (ref 0.61–1.24)
GFR, Estimated: >60 mL/min (ref >60)
Glucose, Bld: 144 mg/dL — ABNORMAL HIGH (ref 70–99)
Potassium: 3.6 mmol/L (ref 3.5–5.1)
Sodium: 140 mmol/L (ref 135–145)
Total Bilirubin: 0.8 mg/dL (ref 0.3–1.2)
Total Protein: 7.3 g/dL (ref 6.5–8.1)

## 2022-04-08 LAB — CBC WITH DIFFERENTIAL (CANCER CENTER ONLY)
Abs Immature Granulocytes: 0.01 10*3/uL (ref 0.00–0.07)
Basophils Absolute: 0 10*3/uL (ref 0.0–0.1)
Basophils Relative: 1 %
Eosinophils Absolute: 0.2 10*3/uL (ref 0.0–0.5)
Eosinophils Relative: 3 %
HCT: 37.3 % — ABNORMAL LOW (ref 39.0–52.0)
Hemoglobin: 12.6 g/dL — ABNORMAL LOW (ref 13.0–17.0)
Immature Granulocytes: 0 %
Lymphocytes Relative: 32 %
Lymphs Abs: 1.4 10*3/uL (ref 0.7–4.0)
MCH: 29.6 pg (ref 26.0–34.0)
MCHC: 33.8 g/dL (ref 30.0–36.0)
MCV: 87.6 fL (ref 80.0–100.0)
Monocytes Absolute: 0.2 10*3/uL (ref 0.1–1.0)
Monocytes Relative: 5 %
Neutro Abs: 2.6 10*3/uL (ref 1.7–7.7)
Neutrophils Relative %: 59 %
Platelet Count: 230 10*3/uL (ref 150–400)
RBC: 4.26 MIL/uL (ref 4.22–5.81)
RDW: 13.2 % (ref 11.5–15.5)
WBC Count: 4.4 10*3/uL (ref 4.0–10.5)
nRBC: 0 % (ref 0.0–0.2)

## 2022-04-08 LAB — GLUCOSE, CAPILLARY: Glucose-Capillary: 96 mg/dL (ref 70–99)

## 2022-04-08 MED ORDER — FLUDEOXYGLUCOSE F - 18 (FDG) INJECTION
7.0600 | Freq: Once | INTRAVENOUS | Status: AC | PRN
  Administered 2022-04-08: 7.06 via INTRAVENOUS

## 2022-04-10 ENCOUNTER — Other Ambulatory Visit: Payer: Self-pay

## 2022-04-10 ENCOUNTER — Inpatient Hospital Stay: Payer: Medicaid Other

## 2022-04-10 ENCOUNTER — Encounter: Payer: Self-pay | Admitting: Hematology

## 2022-04-10 ENCOUNTER — Inpatient Hospital Stay (HOSPITAL_BASED_OUTPATIENT_CLINIC_OR_DEPARTMENT_OTHER): Payer: Medicaid Other | Admitting: Hematology

## 2022-04-10 VITALS — BP 111/79 | HR 80 | Temp 98.7°F | Resp 14 | Wt 145.6 lb

## 2022-04-10 DIAGNOSIS — F209 Schizophrenia, unspecified: Secondary | ICD-10-CM | POA: Diagnosis not present

## 2022-04-10 DIAGNOSIS — D649 Anemia, unspecified: Secondary | ICD-10-CM | POA: Diagnosis not present

## 2022-04-10 DIAGNOSIS — C186 Malignant neoplasm of descending colon: Secondary | ICD-10-CM

## 2022-04-10 DIAGNOSIS — R7401 Elevation of levels of liver transaminase levels: Secondary | ICD-10-CM | POA: Diagnosis not present

## 2022-04-10 NOTE — Progress Notes (Signed)
[place]   [phone]) [phone] [phone]) [phone]   Clinic Follow up Note   Patient Care Team: Medicine, [facility] as PCP - General Clovis Riley, MD as Consulting Physician (General Surgery) Truitt Merle, MD as Consulting Physician (Oncology) Royston Bake, RN as Oncology Nurse Navigator (Oncology)  Date of Service:  04/10/2022  CHIEF COMPLAINT: f/u of colon cancer  CURRENT THERAPY:  Surveillance  ASSESSMENT & PLAN:  Matthew [name] is a 56 y.o. male with   1. Cancer of left colon, stage IIIB p(T3, N1aM0), MSS, probable lung and node metastasis in 04/2022 -presented to ED on 08/06/21 with recurrent upper abdominal pain. CT AP showed sigmoid colon perforation, with possible underlying malignancy. This was complicated with abdominal abscess, which required surgery and IV antibiotics.  -emergent partial colectomy on 08/08/21 by Dr. Windle Guard showed invasive moderately differentiated adenocarcinoma. Margins negative, one lymph node showed metastatic carcinoma (1/12). -Staging chest CT 08/15/21 was negative for metastatic disease. -he began adjuvant CAPOX on 10/10/21 and completed on 12/25/21. At last visit on 02/09/22, he was still taking Xeloda and was told to stop. His mother was supposedly taking care of his meds but she did not follow up the instructions  -restaging PET scan on 04/08/22 showed: mild abdominal retroperitoneal lymphadenopathy is hypermetabolic; several sub-centimeter bilateral pulmonary nodules show hypermetabolism. -I reviewed the results and images with them today. I repeatedly explain the fact that this is now very likely metastatic disease. I recommend biopsy of a hypermetabolic lymph node to confirm the presence of cancer. However I am not sure if his mother was able to completely understand it  -due to multiple site mets, this is unlikely resectable metastasis. I discussed that the only treatment would be chemotherapy. Due to his schizophrenia  and cognitive deficiency, I am not sure if chemotherapy would be the right choose for him. His mother has difficulty to understand the benefit and risk of treatment. I called pt's sister Matthew [name] (with his mother's permission) but did not reach her, I left a message. -if biopsy is not feasible, I will repeat a scan in 2 months. If the metastases have grown, I will recommend treatment at that time. Pt is totally asymptomatic now. -lab from 9/6 reviewed, improving off chemo, except his LFT's remain slightly elevated.   2. Anemia, likely iron deficient -chronic prior to diagnosis, worsened with surgery -he is currently on oral iron. -improving off treatment.   3. Schizophrenia, disabled, social support -he was diagnosed with schizophrenia in his 20's, managed with medication, he is on disability. -he lives with his mother; he is able to do ADLs for himself, but not iADLS. They are both on disability. His mother is also a poor historian. -he has a home care nurse that comes in.    4. Transaminitis -Probably related to Xeloda, he is asymptomatic -repeat 04/08/22 showed improvement but with some persistent elevation.    PLAN:  -I reviewed the PET scan images and findings with patient and his mother, he likely has metastatic disease in his lungs and RP nodes.   -Will discuss potential biopsy with IR -f/u in 2 months with lab and CT several days before   No problem-specific Assessment & Plan notes found for this encounter.   SUMMARY OF ONCOLOGIC HISTORY: Oncology History Overview Note   Cancer Staging  Cancer of left colon [facility]) Staging form: Colon and Rectum, AJCC 8th Edition - Pathologic stage from 08/08/2021: Stage IIIB (pT3, pN1a, cM0) - Signed by Burr Medico,  Krista Blue, MD on 08/26/2021    Cancer of left colon ([place])  04/01/2020 Imaging   IMPRESSION: 1. Gallbladder decompressed bowel also partially calcified gallstones. No pericholecystic inflammation though if there is persisting clinical concern  for cholecystitis right upper quadrant ultrasound could be obtained. 2. Circumferential thickening of the distal thoracic esophagus. Could reflect features of esophagitis. Correlate with clinical symptoms and consider endoscopy as clinically warranted. 3. Additional segmental thickening of the mid to distal sigmoid with focal narrowing. No acute surrounding inflammation or resulting obstruction. Findings are nonspecific, and could reflect sequela of prior inflammation/infection. However, recommend correlation with colonoscopy if not recently performed. 4. Mild circumferential bladder wall thickening and indentation of the bladder base by an enlarged prostate. Possibly sequela of chronic outlet obstruction though could correlate with urinalysis to exclude cystitis. 5. Aortic Atherosclerosis (ICD10-I70.0).   08/06/2021 Imaging   IMPRESSION: Sigmoid colonic perforation with small free intraperitoneal gas and infiltration of the mesenteric and omental fat in keeping with changes of peritonitis.   Long segment inflammatory stranding of the sigmoid colon in keeping with a severe infectious or inflammatory colitis. This terminates an area of irregular mural thickening, infiltrative soft tissue within the colonic mesentery, and focal dystrophic calcification. This may represent a chronic inflammatory process, however, a perforated malignancy could appear similarly. There are 2 separate points of perforation which again raise the question of an underlying malignancy.   7.1 cm gas and fluid containing pericolonic abscess within the sigmoid mesentery.   Marked inflammatory change of the terminal ileum adjacent to the pericolonic abscess with resultant small bowel obstruction. Fluid within the distal esophagus likely relates to gastroesophageal reflux the setting of vomiting.   Aortic Atherosclerosis (ICD10-I70.0).   08/08/2021 Cancer Staging   Staging form: Colon and Rectum, AJCC 8th Edition -  Pathologic stage from 08/08/2021: Stage IIIB (pT3, pN1a, cM0) - Signed by Truitt Merle, MD on 08/26/2021 Stage prefix: Initial diagnosis Total positive nodes: 1 Histologic grading system: 4 grade system Histologic grade (G): G2 Residual tumor (R): R0 - None   08/08/2021 Definitive Surgery   FINAL MICROSCOPIC DIAGNOSIS:   A. COLON, SIGMOID, PARTIAL COLECTOMY:  - Invasive moderately differentiated adenocarcinoma.  - Metastatic carcinoma involving one of twelve lymph nodes (1/12).  - See oncology table below.   ADDENDUM:  Mismatch Repair Protein ([facility])  SUMMARY INTERPRETATION: NORMAL    08/14/2021 Imaging   EXAM: CT ABDOMEN AND PELVIS WITH CONTRAST  IMPRESSION: 1. Post recent sigmoid colectomy with left lower quadrant colostomy. Two small residual foci of air within the pelvic mesentery with mild adjacent thickening, but no abscess or drainable collection. Trace non organized free fluid and stranding in the pelvis. 2. Short segment of small bowel wall thickening and inflammation in the pelvis involving the distal ileum, likely reactive. 3. Dilated distal esophagus, stomach, and small bowel, without discrete transition point, favoring postoperative ileus. 4. Small bilateral pleural effusions and compressive atelectasis. 5. Heterogeneous partially enhancing 14 mm lymph node in the retroperitoneum at the aortoiliac bifurcation, not significantly changed from prior exam. Suspected additional lymph nodes in the anterior common iliac space, not significantly changed from prior exam. Recommend attention at follow-up. 6. Additional chronic findings as described.     08/15/2021 Imaging   EXAM: CT CHEST WITH CONTRAST  IMPRESSION: 1. No evidence of thoracic metastasis. 2. Bilateral small layering pleural effusions with passive atelectasis   08/26/2021 Initial Diagnosis   Cancer of left colon ([place])   10/10/2021 -  Chemotherapy   Patient is on Treatment  Plan : COLORECTAL Xelox ([place]ox) q21d         INTERVAL HISTORY:  Matthew [name] is here for a follow up of colon cancer. He was last seen by me on 02/09/22. He presents to the clinic accompanied by his mother. He reports he is doing well, denies any pain and notes he is eating well. His mother reports intermittent loose BM.   All other systems were reviewed with the patient and are negative.  MEDICAL HISTORY:  Past Medical History:  Diagnosis Date   Schizophrenia [facility])     SURGICAL HISTORY: Past Surgical History:  Procedure Laterality Date   LAPAROTOMY N/A 08/08/2021   Procedure: EXPLORATORY LAPAROTOMY;  Surgeon: Clovis Riley, MD;  Location: [place];  Service: General;  Laterality: N/A;   PARTIAL COLECTOMY N/A 08/08/2021   Procedure: PARTIAL COLECTOMY WITH COLOSTOMY;  Surgeon: Clovis Riley, MD;  Location: [place];  Service: General;  Laterality: N/A;    I have reviewed the social history and family history with the patient and they are unchanged from previous note.  ALLERGIES:  has No Known Allergies.  MEDICATIONS:  Current Outpatient Medications  Medication Sig Dispense Refill   acetaminophen (TYLENOL) 500 MG tablet Take 2 tablets (1,000 mg total) by mouth every 6 (six) hours as needed for mild pain or moderate pain.  0   aspirin EC 81 MG tablet Take 81 mg by mouth daily as needed (for pain or headaches).      benztropine (COGENTIN) 1 MG tablet Take 1 mg at bedtime by mouth.     cetirizine (ZYRTEC) 10 MG tablet Take 10 mg by mouth daily as needed for allergies.     ferrous sulfate 325 (65 FE) MG EC tablet Take 1 tablet (325 mg total) by mouth daily. 30 tablet 3   Multiple Vitamin (MULTIVITAMIN WITH MINERALS) TABS tablet Take 1 tablet by mouth daily.     ondansetron (ZOFRAN) 8 MG tablet Take 1 tablet (8 mg total) by mouth 2 (two) times daily as needed for refractory nausea / vomiting. Start on day 3 after chemotherapy. 30 tablet 1   paliperidone (INVEGA SUSTENNA) 156 MG/ML SUSP injection Inject 156 mg every 30 (thirty)  days into the muscle.     polyethylene glycol (MIRALAX / GLYCOLAX) 17 g packet Take 17 g by mouth daily as needed for mild constipation or moderate constipation.  0   prochlorperazine (COMPAZINE) 10 MG tablet Take 1 tablet (10 mg total) by mouth every 6 (six) hours as needed (Nausea or vomiting). 30 tablet 1   No current facility-administered medications for this visit.    PHYSICAL EXAMINATION: ECOG PERFORMANCE STATUS: 0 - Asymptomatic  Vitals:   04/10/22 1444  BP: 111/79  Pulse: 80  Resp: 14  Temp: 98.7 F (37.1 C)  SpO2: 100%   Wt Readings from Last 3 Encounters:  04/10/22 145 lb 9.6 oz (66 kg)  02/09/22 142 lb 4.8 oz (64.5 kg)  12/12/21 135 lb 3.2 oz (61.3 kg)     GENERAL:alert, no distress and comfortable SKIN: skin color normal, no rashes or significant lesions EYES: normal, Conjunctiva are pink and non-injected, sclera clear  NEURO: alert with fluent speech  LABORATORY DATA:  I have reviewed the data as listed    Latest Ref Rng & Units 04/08/2022    9:26 AM 02/23/2022    1:56 PM 02/05/2022    9:23 AM  CBC  WBC 4.0 - 10.5 K/uL 4.4  4.1  3.7   Hemoglobin 13.0 -  17.0 g/dL 12.6  12.4  11.6   Hematocrit 39.0 - 52.0 % 37.3  36.2  34.9   Platelets 150 - 400 K/uL 230  212  187         Latest Ref Rng & Units 04/08/2022    9:26 AM 02/23/2022    1:56 PM 02/05/2022    9:23 AM  CMP  Glucose 70 - 99 mg/dL 144  97  86   BUN 6 - 20 mg/dL _0 Creatinine 0.61 - 1.24 mg/dL 0.99  0.99  0.92   Sodium 135 - 145 mmol/L 140  140  140   Potassium 3.5 - 5.1 mmol/L 3.6  3.9  3.8   Chloride 98 - 111 mmol/L 109  108  107   CO2 22 - 32 mmol/L _1 Calcium 8.9 - 10.3 mg/dL 9.0  9.6  9.5   Total Protein 6.5 - 8.1 g/dL 7.3  7.4  7.5   Total Bilirubin 0.3 - 1.2 mg/dL 0.8  0.8  0.8   Alkaline Phos 38 - 126 U/L 182  213  285   AST 15 - 41 U/L 46  43  109   ALT 0 - 44 U/L 64  40  110       RADIOGRAPHIC STUDIES: I have personally reviewed the radiological images as listed  and agreed with the findings in the report. No results found.    No orders of the defined types were placed in this encounter.  All questions were answered. The patient knows to call the clinic with any problems, questions or concerns. No barriers to learning was detected. The total time spent in the appointment was 40 minutes.     Truitt Merle, MD 04/10/2022   I, Wilburn Mylar, am acting as scribe for Truitt Merle, MD.   I have reviewed the above documentation for accuracy and completeness, and I agree with the above.

## 2022-04-11 ENCOUNTER — Other Ambulatory Visit: Payer: Self-pay

## 2022-04-14 ENCOUNTER — Other Ambulatory Visit (HOSPITAL_COMMUNITY): Payer: Self-pay | Admitting: Nurse Practitioner

## 2022-04-14 DIAGNOSIS — K9400 Colostomy complication, unspecified: Secondary | ICD-10-CM

## 2022-04-14 DIAGNOSIS — L24B3 Irritant contact dermatitis related to fecal or urinary stoma or fistula: Secondary | ICD-10-CM

## 2022-04-15 ENCOUNTER — Telehealth: Payer: Self-pay

## 2022-04-15 NOTE — Telephone Encounter (Signed)
This patients mother called and left a message stating that she needed to place an order for the patients ostomy supplies.  This nurse returned call and advised that she should give a call to the [facility].  She informed this nurse that she has done that and we called her and told her that the supplies will be shipped tomorrow.  This nurse advised that we were not the [facility] we are the Oncology office.  This nurse verified that she had the correct information to place the order for the supplies and she verified the phone number. She then asked this nurse if patient should drink the prep before coming to see the provider in November.  This nurse explained that the prep drink is for the CT scan only that the doctor wants to have completed in November and our schedulers will reach out to schedule that for her.  She acknowledged understanding.  No further questions or concerns at this time.  She knows to call if there are any other questions.

## 2022-04-21 ENCOUNTER — Telehealth: Payer: Self-pay | Admitting: Hematology

## 2022-04-21 ENCOUNTER — Encounter: Payer: Self-pay | Admitting: Hematology

## 2022-04-21 NOTE — Telephone Encounter (Signed)
Pt's mother called and asked what's next step abut his cancer. I have previously discussed his PET scan findings with IR Dr. Kathlene Cote and pulmonologist Dr. Valeta Harms about tissue biopsy. Dr. Valeta Harms is willing to try bronchoscopy and biopsy although it may be challenge to biopsy. I explained to her. I will go ahead to refer him to Dr. Valeta Harms for lung nodule biopsy. She agrees with the plan.   Truitt Merle MD   04/21/2022

## 2022-04-21 NOTE — Progress Notes (Signed)
Received call from patient's mother Lorna Dibble regarding urgent concerns.  Advised I would send message to provider and nurses to follow up with her. She verbalized understanding.  Staff message sent to provider and nurses.

## 2022-04-23 ENCOUNTER — Telehealth: Payer: Self-pay | Admitting: Pulmonary Disease

## 2022-04-23 NOTE — Telephone Encounter (Signed)
I called the patient and left a message to call back. He will need a follow up with Dr. Valeta Harms on 05/06/2022 per Dr. Valeta Harms.

## 2022-04-23 NOTE — Telephone Encounter (Signed)
Called and left voicemail for patient to call office back and schedule him to see Dr Valeta Harms on October 4th.

## 2022-04-23 NOTE — Telephone Encounter (Signed)
-----   Message from Garner Nash, DO sent at 04/23/2022  1:09 PM EDT ----- Regarding: appt Oct 4th  Please schedule with me on October 4th  Thanks  BLI     ----- Message ----- From: Truitt Merle, MD Sent: 04/21/2022   3:03 PM EDT To: Garner Nash, DO; Evalee Jefferson, RN Subject: RE: Concerns                                   Great, thanks much   ----- Message ----- From: Garner Nash, DO Sent: 04/21/2022   2:21 PM EDT To: Minette Brine; Truitt Merle, MD; # Subject: RE: Concerns                                   Yes I will see on Oct 4th  Thanks Brad   ----- Message ----- From: Truitt Merle, MD Sent: 04/21/2022  12:53 PM EDT To: Minette Brine; Garner Nash, DO; # Subject: RE: Concerns                                   I called, see my phone note.   Leroy Sea, could you get him in to discuss bronchoscopy and biopsy? I previously discussed his lung nodule with you.  Thanks   Krista Blue  ----- Message ----- From: Minette Brine Sent: 04/21/2022   9:40 AM EDT To: Truitt Merle, MD; Estella Husk, LPN; # Subject: Concerns                                       Good morning.  This patient's mom Angelina Neece is calling with  urgent concerns regarding her son and next steps. Would someone please give her a call at [phone]?   Best, Marguarite Arbour

## 2022-04-23 NOTE — Telephone Encounter (Signed)
See other note. Closing encounter.

## 2022-05-06 ENCOUNTER — Encounter: Payer: Self-pay | Admitting: Pulmonary Disease

## 2022-05-06 ENCOUNTER — Encounter: Payer: Self-pay | Admitting: Emergency Medicine

## 2022-05-06 ENCOUNTER — Ambulatory Visit (INDEPENDENT_AMBULATORY_CARE_PROVIDER_SITE_OTHER): Payer: Medicaid Other | Admitting: Pulmonary Disease

## 2022-05-06 ENCOUNTER — Encounter: Payer: Self-pay | Admitting: Hematology

## 2022-05-06 VITALS — BP 108/70 | HR 81 | Ht 69.0 in | Wt 148.6 lb

## 2022-05-06 DIAGNOSIS — E43 Unspecified severe protein-calorie malnutrition: Secondary | ICD-10-CM

## 2022-05-06 DIAGNOSIS — R918 Other nonspecific abnormal finding of lung field: Secondary | ICD-10-CM | POA: Diagnosis not present

## 2022-05-06 DIAGNOSIS — Z9889 Other specified postprocedural states: Secondary | ICD-10-CM | POA: Diagnosis not present

## 2022-05-06 DIAGNOSIS — Z85038 Personal history of other malignant neoplasm of large intestine: Secondary | ICD-10-CM

## 2022-05-06 DIAGNOSIS — Z933 Colostomy status: Secondary | ICD-10-CM

## 2022-05-06 DIAGNOSIS — C186 Malignant neoplasm of descending colon: Secondary | ICD-10-CM

## 2022-05-06 NOTE — Progress Notes (Signed)
Patient's mother Hudsen Fei called regarding when patient needs to drink liquid and appointment for test. Advised I would send message to clinical staff to give her a call with details. She verbalized understanding.  Message sent to nurse and provider.

## 2022-05-06 NOTE — H&P (View-Only) (Signed)
Synopsis: Referred in October 2023 for lung nodules, colon cancer by Medicine, [facility]  Subjective:   PATIENT ID: Matthew [name] GENDER: male DOB: May 04, 1966, MRN: [id number]  Chief Complaint  Patient presents with   Consult    Lung nodule    This is a 56 year old gentleman past medical history of schizophrenia and colon cancer status post partial colectomy in January.Patient is followed by Dr. Burr Medico from medical oncology.  Patient was diagnosed with a left colon cancer stage IIIb, pT3, N1A, M0 disease now as of September CT imaging concerning for node metastasis and lung metastasis.  Patient has been referred from medical oncology for consideration of bronchoscopy and biopsy.  Patient had a nuclear medicine PET scan completed on 04/09/2022.  Patient was found to have a 1.4 cm calcified retroperitoneal node.  Found to have a 8 mm pulmonary nodule in the posterior lingula with no significant change in size does has SUV max of 2.8.  Also has a 4 mm pulmonary nodule in the left upper lobe which showed low-grade SUV uptake.  Has a 9 mm pulmonary nodule in the right middle lobe with increased in size from 6 mm.  This also shows FDG uptake with SUV max of 2.3.  The several subcentimeter bilateral pulmonary nodules that show hypermetabolism 1 within the right middle lobe that shows increased in size are suspicious for pulmonary metastasis.    Past Medical History:  Diagnosis Date   Schizophrenia ([place])      Family History  Problem Relation Age of Onset   Diabetes Father    Heart attack Father    Cancer Other        unknown type cancer   Cancer Cousin        breast cancer   Colon cancer Neg Hx      Past Surgical History:  Procedure Laterality Date   LAPAROTOMY N/A 08/08/2021   Procedure: EXPLORATORY LAPAROTOMY;  Surgeon: Clovis Riley, MD;  Location: [facility];  Service: General;  Laterality: N/A;   PARTIAL COLECTOMY N/A 08/08/2021   Procedure: PARTIAL COLECTOMY WITH COLOSTOMY;   Surgeon: Clovis Riley, MD;  Location: [facility];  Service: General;  Laterality: N/A;    Social History   Socioeconomic History   Marital status: Single    Spouse name: Not on file   Number of children: 0   Years of education: Not on file   Highest education level: Not on file  Occupational History   Not on file  Tobacco Use   Smoking status: Never   Smokeless tobacco: Not on file  Substance and Sexual Activity   Alcohol use: No   Drug use: Not on file   Sexual activity: Not on file  Other Topics Concern   Not on file  Social History Narrative   Not on file   Social Determinants of Health   Financial Resource Strain: Not on file  Food Insecurity: Not on file  Transportation Needs: Unmet Transportation Needs (02/09/2022)   PRAPARE - Hydrologist (Medical): Yes    Lack of Transportation (Non-Medical): Yes  Physical Activity: Not on file  Stress: Not on file  Social Connections: Not on file  Intimate Partner Violence: Not on file     No Known Allergies   Outpatient Medications Prior to Visit  Medication Sig Dispense Refill   acetaminophen (TYLENOL) 500 MG tablet Take 2 tablets (1,000 mg total) by mouth every 6 (six) hours as needed for mild  pain or moderate pain.  0   aspirin EC 81 MG tablet Take 81 mg by mouth daily as needed (for pain or headaches).      benztropine (COGENTIN) 1 MG tablet Take 1 mg at bedtime by mouth.     cetirizine (ZYRTEC) 10 MG tablet Take 10 mg by mouth daily as needed for allergies.     ferrous sulfate 325 (65 FE) MG EC tablet Take 1 tablet (325 mg total) by mouth daily. 30 tablet 3   Multiple Vitamin (MULTIVITAMIN WITH MINERALS) TABS tablet Take 1 tablet by mouth daily.     ondansetron (ZOFRAN) 8 MG tablet Take 1 tablet (8 mg total) by mouth 2 (two) times daily as needed for refractory nausea / vomiting. Start on day 3 after chemotherapy. 30 tablet 1   paliperidone (INVEGA SUSTENNA) 156 MG/ML SUSP injection Inject  156 mg every 30 (thirty) days into the muscle.     polyethylene glycol (MIRALAX / GLYCOLAX) 17 g packet Take 17 g by mouth daily as needed for mild constipation or moderate constipation.  0   prochlorperazine (COMPAZINE) 10 MG tablet Take 1 tablet (10 mg total) by mouth every 6 (six) hours as needed (Nausea or vomiting). 30 tablet 1   No facility-administered medications prior to visit.    Review of Systems  Constitutional:  Negative for chills, fever, malaise/fatigue and weight loss.  HENT:  Negative for hearing loss, sore throat and tinnitus.   Eyes:  Negative for blurred vision and double vision.  Respiratory:  Negative for cough, hemoptysis, sputum production, shortness of breath, wheezing and stridor.   Cardiovascular:  Negative for chest pain, palpitations, orthopnea, leg swelling and PND.  Gastrointestinal:  Negative for abdominal pain, constipation, diarrhea, heartburn, nausea and vomiting.  Genitourinary:  Negative for dysuria, hematuria and urgency.  Musculoskeletal:  Negative for joint pain and myalgias.  Skin:  Negative for itching and rash.  Neurological:  Negative for dizziness, tingling, weakness and headaches.  Endo/Heme/Allergies:  Negative for environmental allergies. Does not bruise/bleed easily.  Psychiatric/Behavioral:  Negative for depression. The patient is not nervous/anxious and does not have insomnia.   All other systems reviewed and are negative.    Objective:  Physical Exam Vitals reviewed.  Constitutional:      General: He is not in acute distress.    Appearance: He is well-developed.  HENT:     Head: Normocephalic and atraumatic.  Eyes:     General: No scleral icterus.    Conjunctiva/sclera: Conjunctivae normal.     Pupils: Pupils are equal, round, and reactive to light.  Neck:     Vascular: No JVD.     Trachea: No tracheal deviation.  Cardiovascular:     Rate and Rhythm: Normal rate and regular rhythm.     Heart sounds: Normal heart sounds. No  murmur heard. Pulmonary:     Effort: Pulmonary effort is normal. No tachypnea, accessory muscle usage or respiratory distress.     Breath sounds: No stridor. No wheezing, rhonchi or rales.  Abdominal:     General: There is no distension.     Palpations: Abdomen is soft.     Tenderness: There is no abdominal tenderness.  Musculoskeletal:        General: No tenderness.     Cervical back: Neck supple.  Lymphadenopathy:     Cervical: No cervical adenopathy.  Skin:    General: Skin is warm and dry.     Capillary Refill: Capillary refill takes less than 2 seconds.  Findings: No rash.  Neurological:     Mental Status: He is alert and oriented to person, place, and time.  Psychiatric:        Behavior: Behavior normal.      Vitals:   05/06/22 1550  BP: 108/70  Pulse: 81  SpO2: 99%  Weight: 148 lb 9.6 oz (67.4 kg)  Height: 5\' 9"  (1.753 m)   99% on  RA BMI Readings from Last 3 Encounters:  05/06/22 21.94 kg/m  04/10/22 21.50 kg/m  02/09/22 21.01 kg/m   Wt Readings from Last 3 Encounters:  05/06/22 148 lb 9.6 oz (67.4 kg)  04/10/22 145 lb 9.6 oz (66 kg)  02/09/22 142 lb 4.8 oz (64.5 kg)     CBC    Component Value Date/Time   WBC 4.4 04/08/2022 0926   WBC 12.5 (H) 08/16/2021 0420   RBC 4.26 04/08/2022 0926   HGB 12.6 (L) 04/08/2022 0926   HCT 37.3 (L) 04/08/2022 0926   PLT 230 04/08/2022 0926   MCV 87.6 04/08/2022 0926   MCH 29.6 04/08/2022 0926   MCHC 33.8 04/08/2022 0926   RDW 13.2 04/08/2022 0926   LYMPHSABS 1.4 04/08/2022 0926   MONOABS 0.2 04/08/2022 0926   EOSABS 0.2 04/08/2022 0926   BASOSABS 0.0 04/08/2022 0926     Chest Imaging: Nuclear medicine pet imaging September 2023: Multiple small subcentimeter lesions within the chest.  1 of which within the right middle lobe has shown increase in size concerning for malignancy.  Due to his colon cancer history is concern for metastatic disease. The patient's images have been independently reviewed by me.     Pulmonary Functions Testing Results:     No data to display          FeNO:   Pathology:   Echocardiogram:   Heart Catheterization:     Assessment & Plan:     ICD-10-CM   1. Multiple nodules of lung  R91.8 Procedural/ Surgical Case Request: ROBOTIC ASSISTED NAVIGATIONAL BRONCHOSCOPY    Ambulatory referral to Pulmonology    CT Super D Chest Wo Contrast    Novel Coronavirus, NAA ([facility])    2. History of colon cancer  Z85.038     3. Protein-calorie malnutrition, severe  E43     4. History of exploratory laparotomy  Z98.890     5. S/P colostomy ([place])  Z93.3     6. Cancer of left colon ([place])  C18.6 Procedural/ Surgical Case Request: ROBOTIC ASSISTED NAVIGATIONAL BRONCHOSCOPY    Ambulatory referral to Pulmonology    CT Super D Chest Wo Contrast      Discussion:  This is a 56 year old gentleman with stage III colon cancer.  Now has an abnormal PET scan with slowly enlarging pulmonary nodules within the lung concerning for pulmonary metastasis.  Patient was referred for medical oncology for consideration of robotic assisted navigational bronchoscopy tissue sampling.  Plan: I am out of town next week. Next available slot will be with Dr. Lamonte Sakai. We will get him scheduled on Monday for robotic assisted navigational bronchoscopy and tissue sampling. Today in the office we discussed risk benefits and alternatives with patient's mother.  Patient has schizophrenia and his mother helps him with medical decisions and consent. Appreciate [place] help with scheduling.   Current Outpatient Medications:    acetaminophen (TYLENOL) 500 MG tablet, Take 2 tablets (1,000 mg total) by mouth every 6 (six) hours as needed for mild pain or moderate pain., Disp: , Rfl: 0   aspirin EC 81  MG tablet, Take 81 mg by mouth daily as needed (for pain or headaches). , Disp: , Rfl:    benztropine (COGENTIN) 1 MG tablet, Take 1 mg at bedtime by mouth., Disp: , Rfl:    cetirizine (ZYRTEC) 10 MG tablet,  Take 10 mg by mouth daily as needed for allergies., Disp: , Rfl:    ferrous sulfate 325 (65 FE) MG EC tablet, Take 1 tablet (325 mg total) by mouth daily., Disp: 30 tablet, Rfl: 3   Multiple Vitamin (MULTIVITAMIN WITH MINERALS) TABS tablet, Take 1 tablet by mouth daily., Disp: , Rfl:    ondansetron (ZOFRAN) 8 MG tablet, Take 1 tablet (8 mg total) by mouth 2 (two) times daily as needed for refractory nausea / vomiting. Start on day 3 after chemotherapy., Disp: 30 tablet, Rfl: 1   paliperidone (INVEGA SUSTENNA) 156 MG/ML SUSP injection, Inject 156 mg every 30 (thirty) days into the muscle., Disp: , Rfl:    polyethylene glycol (MIRALAX / GLYCOLAX) 17 g packet, Take 17 g by mouth daily as needed for mild constipation or moderate constipation., Disp: , Rfl: 0   prochlorperazine (COMPAZINE) 10 MG tablet, Take 1 tablet (10 mg total) by mouth every 6 (six) hours as needed (Nausea or vomiting)., Disp: 30 tablet, Rfl: 1  I spent 63 minutes dedicated to the care of this patient on the date of this encounter to include pre-visit review of [name], face-to-face time with the patient discussing conditions above, post visit ordering of testing, clinical documentation with the electronic health record, making appropriate referrals as documented, and communicating necessary findings to members of the patients care team.   Garner Nash, DO [facility] 05/06/2022 5:09 PM

## 2022-05-06 NOTE — Progress Notes (Signed)
Synopsis: Referred in October 2023 for lung nodules, colon cancer by [facility]  Subjective:   PATIENT ID: Matthew [name] GENDER: male DOB: 04/10/1966, MRN: [id number]  Chief Complaint  Patient presents with   Consult    Lung nodule    This is a 56 year old gentleman past medical history of schizophrenia and colon cancer status post partial colectomy in January.Patient is followed by Dr. Burr Medico from medical oncology.  Patient was diagnosed with a left colon cancer stage IIIb, pT3, N1A, M0 disease now as of September CT imaging concerning for node metastasis and lung metastasis.  Patient has been referred from medical oncology for consideration of bronchoscopy and biopsy.  Patient had a nuclear medicine PET scan completed on 04/09/2022.  Patient was found to have a 1.4 cm calcified retroperitoneal node.  Found to have a 8 mm pulmonary nodule in the posterior lingula with no significant change in size does has SUV max of 2.8.  Also has a 4 mm pulmonary nodule in the left upper lobe which showed low-grade SUV uptake.  Has a 9 mm pulmonary nodule in the right middle lobe with increased in size from 6 mm.  This also shows FDG uptake with SUV max of 2.3.  The several subcentimeter bilateral pulmonary nodules that show hypermetabolism 1 within the right middle lobe that shows increased in size are suspicious for pulmonary metastasis.    Past Medical History:  Diagnosis Date   Schizophrenia ([place])      Family History  Problem Relation Age of Onset   Diabetes Father    Heart attack Father    Cancer Other        unknown type cancer   Cancer Cousin        breast cancer   Colon cancer Neg Hx      Past Surgical History:  Procedure Laterality Date   LAPAROTOMY N/A 08/08/2021   Procedure: EXPLORATORY LAPAROTOMY;  Surgeon: Clovis Riley, MD;  Location: [facility];  Service: General;  Laterality: N/A;   PARTIAL COLECTOMY N/A 08/08/2021   Procedure: PARTIAL COLECTOMY WITH COLOSTOMY;   Surgeon: Clovis Riley, MD;  Location: [facility];  Service: General;  Laterality: N/A;    Social History   Socioeconomic History   Marital status: Single    Spouse name: Not on file   Number of children: 0   Years of education: Not on file   Highest education level: Not on file  Occupational History   Not on file  Tobacco Use   Smoking status: Never   Smokeless tobacco: Not on file  Substance and Sexual Activity   Alcohol use: No   Drug use: Not on file   Sexual activity: Not on file  Other Topics Concern   Not on file  Social History Narrative   Not on file   Social Determinants of Health   Financial Resource Strain: Not on file  Food Insecurity: Not on file  Transportation Needs: Unmet Transportation Needs (02/09/2022)   PRAPARE - Hydrologist (Medical): Yes    Lack of Transportation (Non-Medical): Yes  Physical Activity: Not on file  Stress: Not on file  Social Connections: Not on file  Intimate Partner Violence: Not on file     No Known Allergies   Outpatient Medications Prior to Visit  Medication Sig Dispense Refill   acetaminophen (TYLENOL) 500 MG tablet Take 2 tablets (1,000 mg total) by mouth every 6 (six) hours as needed for mild  pain or moderate pain.  0   aspirin EC 81 MG tablet Take 81 mg by mouth daily as needed (for pain or headaches).      benztropine (COGENTIN) 1 MG tablet Take 1 mg at bedtime by mouth.     cetirizine (ZYRTEC) 10 MG tablet Take 10 mg by mouth daily as needed for allergies.     ferrous sulfate 325 (65 FE) MG EC tablet Take 1 tablet (325 mg total) by mouth daily. 30 tablet 3   Multiple Vitamin (MULTIVITAMIN WITH MINERALS) TABS tablet Take 1 tablet by mouth daily.     ondansetron (ZOFRAN) 8 MG tablet Take 1 tablet (8 mg total) by mouth 2 (two) times daily as needed for refractory nausea / vomiting. Start on day 3 after chemotherapy. 30 tablet 1   paliperidone (INVEGA SUSTENNA) 156 MG/ML SUSP injection Inject  156 mg every 30 (thirty) days into the muscle.     polyethylene glycol (MIRALAX / GLYCOLAX) 17 g packet Take 17 g by mouth daily as needed for mild constipation or moderate constipation.  0   prochlorperazine (COMPAZINE) 10 MG tablet Take 1 tablet (10 mg total) by mouth every 6 (six) hours as needed (Nausea or vomiting). 30 tablet 1   No facility-administered medications prior to visit.    Review of Systems  Constitutional:  Negative for chills, fever, malaise/fatigue and weight loss.  HENT:  Negative for hearing loss, sore throat and tinnitus.   Eyes:  Negative for blurred vision and double vision.  Respiratory:  Negative for cough, hemoptysis, sputum production, shortness of breath, wheezing and stridor.   Cardiovascular:  Negative for chest pain, palpitations, orthopnea, leg swelling and PND.  Gastrointestinal:  Negative for abdominal pain, constipation, diarrhea, heartburn, nausea and vomiting.  Genitourinary:  Negative for dysuria, hematuria and urgency.  Musculoskeletal:  Negative for joint pain and myalgias.  Skin:  Negative for itching and rash.  Neurological:  Negative for dizziness, tingling, weakness and headaches.  Endo/Heme/Allergies:  Negative for environmental allergies. Does not bruise/bleed easily.  Psychiatric/Behavioral:  Negative for depression. The patient is not nervous/anxious and does not have insomnia.   All other systems reviewed and are negative.    Objective:  Physical Exam Vitals reviewed.  Constitutional:      General: He is not in acute distress.    Appearance: He is well-developed.  HENT:     Head: Normocephalic and atraumatic.  Eyes:     General: No scleral icterus.    Conjunctiva/sclera: Conjunctivae normal.     Pupils: Pupils are equal, round, and reactive to light.  Neck:     Vascular: No JVD.     Trachea: No tracheal deviation.  Cardiovascular:     Rate and Rhythm: Normal rate and regular rhythm.     Heart sounds: Normal heart sounds. No  murmur heard. Pulmonary:     Effort: Pulmonary effort is normal. No tachypnea, accessory muscle usage or respiratory distress.     Breath sounds: No stridor. No wheezing, rhonchi or rales.  Abdominal:     General: There is no distension.     Palpations: Abdomen is soft.     Tenderness: There is no abdominal tenderness.  Musculoskeletal:        General: No tenderness.     Cervical back: Neck supple.  Lymphadenopathy:     Cervical: No cervical adenopathy.  Skin:    General: Skin is warm and dry.     Capillary Refill: Capillary refill takes less than 2 seconds.  Findings: No rash.  Neurological:     Mental Status: He is alert and oriented to person, place, and time.  Psychiatric:        Behavior: Behavior normal.      Vitals:   05/06/22 1550  BP: 108/70  Pulse: 81  SpO2: 99%  Weight: 148 lb 9.6 oz (67.4 kg)  Height: 5\' 9"  (1.753 m)   99% on  RA BMI Readings from Last 3 Encounters:  05/06/22 21.94 kg/m  04/10/22 21.50 kg/m  02/09/22 21.01 kg/m   Wt Readings from Last 3 Encounters:  05/06/22 148 lb 9.6 oz (67.4 kg)  04/10/22 145 lb 9.6 oz (66 kg)  02/09/22 142 lb 4.8 oz (64.5 kg)     CBC    Component Value Date/Time   WBC 4.4 04/08/2022 0926   WBC 12.5 (H) 08/16/2021 0420   RBC 4.26 04/08/2022 0926   HGB 12.6 (L) 04/08/2022 0926   HCT 37.3 (L) 04/08/2022 0926   PLT 230 04/08/2022 0926   MCV 87.6 04/08/2022 0926   MCH 29.6 04/08/2022 0926   MCHC 33.8 04/08/2022 0926   RDW 13.2 04/08/2022 0926   LYMPHSABS 1.4 04/08/2022 0926   MONOABS 0.2 04/08/2022 0926   EOSABS 0.2 04/08/2022 0926   BASOSABS 0.0 04/08/2022 0926     Chest Imaging: Nuclear medicine pet imaging September 2023: Multiple small subcentimeter lesions within the chest.  1 of which within the right middle lobe has shown increase in size concerning for malignancy.  Due to his colon cancer history is concern for metastatic disease. The patient's images have been independently reviewed by me.     Pulmonary Functions Testing Results:     No data to display          FeNO:   Pathology:   Echocardiogram:   Heart Catheterization:     Assessment & Plan:     ICD-10-CM   1. Multiple nodules of lung  R91.8 Procedural/ Surgical Case Request: ROBOTIC ASSISTED NAVIGATIONAL BRONCHOSCOPY    Ambulatory referral to Pulmonology    CT Super D Chest Wo Contrast    Novel Coronavirus, NAA ([facility])    2. History of colon cancer  Z85.038     3. Protein-calorie malnutrition, severe  E43     4. History of exploratory laparotomy  Z98.890     5. S/P colostomy ([place])  Z93.3     6. Cancer of left colon ([place])  C18.6 Procedural/ Surgical Case Request: ROBOTIC ASSISTED NAVIGATIONAL BRONCHOSCOPY    Ambulatory referral to Pulmonology    CT Super D Chest Wo Contrast      Discussion:  This is a 56 year old gentleman with stage III colon cancer.  Now has an abnormal PET scan with slowly enlarging pulmonary nodules within the lung concerning for pulmonary metastasis.  Patient was referred for medical oncology for consideration of robotic assisted navigational bronchoscopy tissue sampling.  Plan: I am out of town next week. Next available slot will be with Dr. Lamonte Sakai. We will get him scheduled on Monday for robotic assisted navigational bronchoscopy and tissue sampling. Today in the office we discussed risk benefits and alternatives with patient's mother.  Patient has schizophrenia and his mother helps him with medical decisions and consent. Appreciate [place] help with scheduling.   Current Outpatient Medications:    acetaminophen (TYLENOL) 500 MG tablet, Take 2 tablets (1,000 mg total) by mouth every 6 (six) hours as needed for mild pain or moderate pain., Disp: , Rfl: 0   aspirin EC 81  MG tablet, Take 81 mg by mouth daily as needed (for pain or headaches). , Disp: , Rfl:    benztropine (COGENTIN) 1 MG tablet, Take 1 mg at bedtime by mouth., Disp: , Rfl:    cetirizine (ZYRTEC) 10 MG tablet,  Take 10 mg by mouth daily as needed for allergies., Disp: , Rfl:    ferrous sulfate 325 (65 FE) MG EC tablet, Take 1 tablet (325 mg total) by mouth daily., Disp: 30 tablet, Rfl: 3   Multiple Vitamin (MULTIVITAMIN WITH MINERALS) TABS tablet, Take 1 tablet by mouth daily., Disp: , Rfl:    ondansetron (ZOFRAN) 8 MG tablet, Take 1 tablet (8 mg total) by mouth 2 (two) times daily as needed for refractory nausea / vomiting. Start on day 3 after chemotherapy., Disp: 30 tablet, Rfl: 1   paliperidone (INVEGA SUSTENNA) 156 MG/ML SUSP injection, Inject 156 mg every 30 (thirty) days into the muscle., Disp: , Rfl:    polyethylene glycol (MIRALAX / GLYCOLAX) 17 g packet, Take 17 g by mouth daily as needed for mild constipation or moderate constipation., Disp: , Rfl: 0   prochlorperazine (COMPAZINE) 10 MG tablet, Take 1 tablet (10 mg total) by mouth every 6 (six) hours as needed (Nausea or vomiting)., Disp: 30 tablet, Rfl: 1  I spent 63 minutes dedicated to the care of this patient on the date of this encounter to include pre-visit review of [name], face-to-face time with the patient discussing conditions above, post visit ordering of testing, clinical documentation with the electronic health record, making appropriate referrals as documented, and communicating necessary findings to members of the patients care team.   Garner Nash, DO [facility] 05/06/2022 5:09 PM

## 2022-05-06 NOTE — Patient Instructions (Addendum)
Thank you for visiting Dr. Valeta Harms at [facility]. Today we recommend the following:  Orders Placed This Encounter  Procedures   Procedural/ Surgical Case Request: ROBOTIC ASSISTED NAVIGATIONAL BRONCHOSCOPY   CT Super D Chest Wo Contrast   Ambulatory referral to Pulmonology   Bronchoscopy with Dr. Lamonte Sakai on 05/11/2022  Return in about 13 days (around 05/19/2022) for w/ Dr. Lamonte Sakai .    Please do your part to reduce the spread of COVID-19.

## 2022-05-07 ENCOUNTER — Other Ambulatory Visit: Payer: Self-pay

## 2022-05-07 ENCOUNTER — Encounter (HOSPITAL_COMMUNITY): Payer: Self-pay | Admitting: Emergency Medicine

## 2022-05-07 NOTE — Progress Notes (Signed)
Spoke with pt's mother, Matthew [name] for pre-op call. Pt has hx of schizophrenia and has difficulty doing these types of phone calls. Matthew [name] states pt does not have HTN, Heart disease or Diabetes.  Pt has recently been diagnosed with colon cancer in January of this year.   Shower instructions given to Matthew [name] and she voiced understanding.

## 2022-05-11 ENCOUNTER — Ambulatory Visit (HOSPITAL_COMMUNITY): Payer: Medicaid Other

## 2022-05-11 ENCOUNTER — Ambulatory Visit (HOSPITAL_BASED_OUTPATIENT_CLINIC_OR_DEPARTMENT_OTHER): Payer: Medicaid Other | Admitting: Anesthesiology

## 2022-05-11 ENCOUNTER — Encounter (HOSPITAL_COMMUNITY)
Admission: RE | Disposition: A | Discharge status: Home / Self Care | Payer: Self-pay | Source: Home / Self Care | Attending: Emergency Medicine

## 2022-05-11 ENCOUNTER — Ambulatory Visit (HOSPITAL_COMMUNITY)
Admission: RE | Admit: 2022-05-11 | Discharge: 2022-05-11 | Disposition: A | Discharge status: Home / Self Care | Payer: Medicaid Other | Attending: Emergency Medicine | Admitting: Emergency Medicine

## 2022-05-11 ENCOUNTER — Encounter (HOSPITAL_COMMUNITY): Payer: Self-pay | Admitting: Emergency Medicine

## 2022-05-11 ENCOUNTER — Other Ambulatory Visit: Payer: Self-pay

## 2022-05-11 ENCOUNTER — Ambulatory Visit (HOSPITAL_COMMUNITY): Payer: Medicaid Other | Admitting: Anesthesiology

## 2022-05-11 DIAGNOSIS — F209 Schizophrenia, unspecified: Secondary | ICD-10-CM | POA: Diagnosis not present

## 2022-05-11 DIAGNOSIS — Z1152 Encounter for screening for COVID-19: Secondary | ICD-10-CM | POA: Diagnosis not present

## 2022-05-11 DIAGNOSIS — E43 Unspecified severe protein-calorie malnutrition: Secondary | ICD-10-CM | POA: Diagnosis not present

## 2022-05-11 DIAGNOSIS — Z6821 Body mass index (BMI) 21.0-21.9, adult: Secondary | ICD-10-CM | POA: Diagnosis not present

## 2022-05-11 DIAGNOSIS — R918 Other nonspecific abnormal finding of lung field: Secondary | ICD-10-CM | POA: Insufficient documentation

## 2022-05-11 DIAGNOSIS — R846 Abnormal cytological findings in specimens from respiratory organs and thorax: Secondary | ICD-10-CM | POA: Insufficient documentation

## 2022-05-11 DIAGNOSIS — C7801 Secondary malignant neoplasm of right lung: Secondary | ICD-10-CM | POA: Insufficient documentation

## 2022-05-11 DIAGNOSIS — C342 Malignant neoplasm of middle lobe, bronchus or lung: Secondary | ICD-10-CM | POA: Diagnosis not present

## 2022-05-11 DIAGNOSIS — C186 Malignant neoplasm of descending colon: Secondary | ICD-10-CM | POA: Insufficient documentation

## 2022-05-11 HISTORY — PX: VIDEO BRONCHOSCOPY WITH RADIAL ENDOBRONCHIAL ULTRASOUND: SHX6849

## 2022-05-11 HISTORY — PX: BRONCHIAL NEEDLE ASPIRATION BIOPSY: SHX5106

## 2022-05-11 HISTORY — PX: BRONCHIAL BIOPSY: SHX5109

## 2022-05-11 HISTORY — PX: BRONCHIAL WASHINGS: SHX5105

## 2022-05-11 HISTORY — DX: Malignant (primary) neoplasm, unspecified: C80.1

## 2022-05-11 HISTORY — PX: BRONCHIAL BRUSHINGS: SHX5108

## 2022-05-11 LAB — COMPREHENSIVE METABOLIC PANEL
ALT: 90 U/L — ABNORMAL HIGH (ref 0–44)
AST: 74 U/L — ABNORMAL HIGH (ref 15–41)
Albumin: 4 g/dL (ref 3.5–5.0)
Alkaline Phosphatase: 156 U/L — ABNORMAL HIGH (ref 38–126)
Anion gap: 9 (ref 5–15)
BUN: 20 mg/dL (ref 6–20)
CO2: 25 mmol/L (ref 22–32)
Calcium: 9.9 mg/dL (ref 8.9–10.3)
Chloride: 107 mmol/L (ref 98–111)
Creatinine, Ser: 1.09 mg/dL (ref 0.61–1.24)
GFR, Estimated: >60 mL/min (ref >60)
Glucose, Bld: 94 mg/dL (ref 70–99)
Potassium: 4.3 mmol/L (ref 3.5–5.1)
Sodium: 141 mmol/L (ref 135–145)
Total Bilirubin: 1.4 mg/dL — ABNORMAL HIGH (ref 0.3–1.2)
Total Protein: 7.4 g/dL (ref 6.5–8.1)

## 2022-05-11 LAB — SARS CORONAVIRUS 2 BY RT PCR: SARS Coronavirus 2 by RT PCR: NEGATIVE

## 2022-05-11 LAB — CBC
HCT: 42.3 % (ref 39.0–52.0)
Hemoglobin: 13.8 g/dL (ref 13.0–17.0)
MCH: 29.3 pg (ref 26.0–34.0)
MCHC: 32.6 g/dL (ref 30.0–36.0)
MCV: 89.8 fL (ref 80.0–100.0)
Platelets: 232 10*3/uL (ref 150–400)
RBC: 4.71 MIL/uL (ref 4.22–5.81)
RDW: 12.4 % (ref 11.5–15.5)
WBC: 5.2 10*3/uL (ref 4.0–10.5)
nRBC: 0 % (ref 0.0–0.2)

## 2022-05-11 SURGERY — BRONCHOSCOPY, WITH BIOPSY USING ELECTROMAGNETIC NAVIGATION
Anesthesia: General | Laterality: Bilateral

## 2022-05-11 MED ORDER — FENTANYL CITRATE (PF) 100 MCG/2ML IJ SOLN
INTRAMUSCULAR | Status: DC | PRN
  Administered 2022-05-11: 100 ug via INTRAVENOUS

## 2022-05-11 MED ORDER — DEXAMETHASONE SODIUM PHOSPHATE 10 MG/ML IJ SOLN
INTRAMUSCULAR | Status: DC | PRN
  Administered 2022-05-11: 4 mg via INTRAVENOUS

## 2022-05-11 MED ORDER — PROPOFOL 500 MG/50ML IV EMUL
INTRAVENOUS | Status: DC | PRN
  Administered 2022-05-11: 150 ug/kg/min via INTRAVENOUS

## 2022-05-11 MED ORDER — MIDAZOLAM HCL 2 MG/2ML IJ SOLN
INTRAMUSCULAR | Status: DC | PRN
  Administered 2022-05-11: 2 mg via INTRAVENOUS

## 2022-05-11 MED ORDER — PROPOFOL 10 MG/ML IV BOLUS
INTRAVENOUS | Status: DC | PRN
  Administered 2022-05-11: 200 mg via INTRAVENOUS

## 2022-05-11 MED ORDER — CHLORHEXIDINE GLUCONATE 0.12 % MT SOLN
15.0000 mL | Freq: Once | OROMUCOSAL | Status: AC
  Administered 2022-05-11: 15 mL via OROMUCOSAL
  Filled 2022-05-11 (×2): qty 15

## 2022-05-11 MED ORDER — ROCURONIUM BROMIDE 10 MG/ML (PF) SYRINGE
PREFILLED_SYRINGE | INTRAVENOUS | Status: DC | PRN
  Administered 2022-05-11: 20 mg via INTRAVENOUS
  Administered 2022-05-11: 60 mg via INTRAVENOUS

## 2022-05-11 MED ORDER — ONDANSETRON HCL 4 MG/2ML IJ SOLN
INTRAMUSCULAR | Status: DC | PRN
  Administered 2022-05-11: 4 mg via INTRAVENOUS

## 2022-05-11 MED ORDER — LIDOCAINE 2% (20 MG/ML) 5 ML SYRINGE
INTRAMUSCULAR | Status: DC | PRN
  Administered 2022-05-11: 60 mg via INTRAVENOUS

## 2022-05-11 MED ORDER — SUGAMMADEX SODIUM 200 MG/2ML IV SOLN
INTRAVENOUS | Status: DC | PRN
  Administered 2022-05-11: 280 mg via INTRAVENOUS

## 2022-05-11 MED ORDER — ACETAMINOPHEN 500 MG PO TABS
1000.0000 mg | ORAL_TABLET | Freq: Once | ORAL | Status: AC
  Administered 2022-05-11: 1000 mg via ORAL
  Filled 2022-05-11: qty 2

## 2022-05-11 MED ORDER — LACTATED RINGERS IV SOLN: INTRAVENOUS | Status: DC

## 2022-05-11 NOTE — Anesthesia Postprocedure Evaluation (Signed)
Anesthesia Post Note  Patient: Matthew [name]  Procedure(s) Performed: ROBOTIC ASSISTED NAVIGATIONAL BRONCHOSCOPY (Bilateral) BRONCHIAL NEEDLE ASPIRATION BIOPSIES BRONCHIAL BRUSHINGS BRONCHIAL BIOPSIES VIDEO BRONCHOSCOPY WITH RADIAL ENDOBRONCHIAL ULTRASOUND BRONCHIAL WASHINGS     Patient location during evaluation: PACU Anesthesia Type: General Level of consciousness: awake and alert Pain management: pain level controlled Vital Signs Assessment: post-procedure vital signs reviewed and stable Respiratory status: spontaneous breathing, nonlabored ventilation and respiratory function stable Cardiovascular status: stable and blood pressure returned to baseline Anesthetic complications: no   No notable events documented.  Last Vitals:  Vitals:   05/11/22 1352 05/11/22 1400  BP: 120/87 121/86  Pulse: 92 90  Resp: 15 15  Temp:    SpO2: 95%     Last Pain:  Vitals:   05/11/22 1400  TempSrc:   PainSc: 0-No pain                 Audry Pili

## 2022-05-11 NOTE — Op Note (Signed)
Video Bronchoscopy with Robotic Assisted Bronchoscopic Navigation   Date of Operation: 05/11/2022   Pre-op Diagnosis: Bilateral pulmonary nodules  Post-op Diagnosis: Same  Surgeon: Baltazar Apo  Assistants: None  Anesthesia: General endotracheal anesthesia  Operation: Flexible video fiberoptic bronchoscopy with robotic assistance and biopsies.  Estimated Blood Loss: Minimal  Complications: None  Indications and History: Matthew [name] is a 56 y.o. male with history of colon cancer that was resected followed by adjuvant therapy.  Surveillance PET scan revealed pulmonary nodules, 2 of which were slightly enlarged compared with prior imaging and had some mild hypermetabolism.  Recommendation made to achieve a tissue diagnosis via robotic assisted navigational bronchoscopy. The risks, benefits, complications, treatment options and expected outcomes were discussed with the patient.  The possibilities of pneumothorax, pneumonia, reaction to medication, pulmonary aspiration, perforation of a viscus, bleeding, failure to diagnose a condition and creating a complication requiring transfusion or operation were discussed with the patient who freely signed the consent.    Description of Procedure: The patient was seen in the Preoperative Area, was examined and was deemed appropriate to proceed.  The patient was taken to [facility] endoscopy room 3, identified as Matthew [name] and the procedure verified as Flexible Video Fiberoptic Bronchoscopy.  A Time Out was held and the above information confirmed.   Prior to the date of the procedure a high-resolution CT scan of the chest was performed. Utilizing ION software program a virtual tracheobronchial tree was generated to allow the creation of distinct navigation pathways to the patient's parenchymal abnormalities. After being taken to the operating room general anesthesia was initiated and the patient  was orally intubated. The video fiberoptic bronchoscope was  introduced via the endotracheal tube and a general inspection was performed which showed normal right and left lung anatomy. Aspiration of the bilateral mainstems was completed to remove any remaining secretions. Robotic catheter inserted into patient's endotracheal tube.   Target #1 lingular pulmonary nodule: The distinct navigation pathways prepared prior to this procedure were then utilized to navigate to patient's lesion identified on CT scan. The robotic catheter was secured into place and the vision probe was withdrawn.  Lesion location was approximated using fluoroscopy and radial endobronchial ultrasound for peripheral targeting.  Local registration and targeting was performed using Cios three-dimensional imaging.  Under fluoroscopic guidance transbronchial needle brushings, transbronchial needle biopsies, and transbronchial forceps biopsies were performed to be sent for cytology and pathology. A bronchioalveolar lavage was performed in the lingula and sent for cytology.  Target #2 right middle lobe nodule: The distinct navigation pathways prepared prior to this procedure were then utilized to navigate to patient's lesion identified on CT scan. The robotic catheter was secured into place and the vision probe was withdrawn.  Lesion location was approximated using fluoroscopy and radial endobronchial ultrasound for peripheral targeting.  Local registration and targeting was performed using Cios three-dimensional imaging.  Under fluoroscopic guidance transbronchial needle brushings, transbronchial needle biopsies, and transbronchial forceps biopsies were performed to be sent for cytology and pathology. A bronchioalveolar lavage was performed in the right middle lobe and sent for cytology.  At the end of the procedure a general airway inspection was performed and there was no evidence of active bleeding. The bronchoscope was removed.  The patient tolerated the procedure well. There was no significant  blood loss and there were no obvious complications. A post-procedural chest x-ray is pending.  Samples Target #1: 1. Transbronchial needle brushings from lingular nodule 2. Transbronchial Wang needle biopsies from lingular nodule 3.  Transbronchial forceps biopsies from lingular nodule 4. Bronchoalveolar lavage from lingula   Samples Target #2: 1. Transbronchial needle brushings from right middle lobe nodule 2. Transbronchial Wang needle biopsies from right middle lobe nodule 3. Transbronchial forceps biopsies from right middle lobe nodule 4. Bronchoalveolar lavage from right middle lobe   Plans:  The patient will be discharged from the PACU to home when recovered from anesthesia and after chest x-ray is reviewed. We will review the cytology, pathology and microbiology results with the patient when they become available. Outpatient followup will be with Dr. Valeta Harms and Dr. Burr Medico.    Baltazar Apo, MD, PhD 05/11/2022, 1:12 PM [facility] [phone] or if no answer before 7:00PM call [phone] For any issues after 7:00PM please call eLink [phone]

## 2022-05-11 NOTE — Anesthesia Preprocedure Evaluation (Addendum)
Anesthesia Evaluation  Patient identified by MRN, date of birth, ID band Patient awake    Reviewed: Allergy & Precautions, NPO status , Patient's Chart, lab work & pertinent test results  History of Anesthesia Complications Negative for: history of anesthetic complications  Airway Mallampati: III  TM Distance: >3 FB Neck ROM: Full    Dental  (+) Dental Advisory Given   Pulmonary   Hypermetabolic lung nodules seen on PET scan, likely metastases    Pulmonary exam normal        Cardiovascular negative cardio ROS Normal cardiovascular exam     Neuro/Psych PSYCHIATRIC DISORDERS Schizophrenia negative neurological ROS     GI/Hepatic Neg liver ROS,  Colon cancer    Endo/Other  negative endocrine ROS  Renal/GU negative Renal ROS     Musculoskeletal negative musculoskeletal ROS (+)   Abdominal   Peds  Hematology negative hematology ROS (+)   Anesthesia Other Findings   Reproductive/Obstetrics                            Anesthesia Physical Anesthesia Plan  ASA: 3  Anesthesia Plan: General   Post-op Pain Management: Tylenol PO (pre-op)*   Induction: Intravenous  PONV Risk Score and Plan: 2 and Treatment may vary due to age or medical condition, Ondansetron and Midazolam  Airway Management Planned: Oral ETT  Additional Equipment: None  Intra-op Plan:   Post-operative Plan: Extubation in OR  Informed Consent: I have reviewed the patients History and Physical, chart, labs and discussed the procedure including the risks, benefits and alternatives for the proposed anesthesia with the patient or authorized representative who has indicated his/her understanding and acceptance.     Dental advisory given and Consent reviewed with POA  Plan Discussed with: CRNA and Anesthesiologist  Anesthesia Plan Comments:        Anesthesia Quick Evaluation

## 2022-05-11 NOTE — Transfer of Care (Signed)
Immediate Anesthesia Transfer of Care Note  Patient: Matthew [name]  Procedure(s) Performed: ROBOTIC ASSISTED NAVIGATIONAL BRONCHOSCOPY (Bilateral) BRONCHIAL NEEDLE ASPIRATION BIOPSIES BRONCHIAL BRUSHINGS BRONCHIAL BIOPSIES VIDEO BRONCHOSCOPY WITH RADIAL ENDOBRONCHIAL ULTRASOUND BRONCHIAL WASHINGS  Patient Location: PACU and Endoscopy Unit  Anesthesia Type:General  Level of Consciousness: awake and patient cooperative  Airway & Oxygen Therapy: Patient Spontanous Breathing  Post-op Assessment: Report given to RN and Post -op Vital signs reviewed and stable  Post vital signs: Reviewed and stable  Last Vitals:  Vitals Value Taken Time  BP 108/71 05/11/22 1324  Temp 36.1 C 05/11/22 1324  Pulse 98 05/11/22 1326  Resp 14 05/11/22 1326  SpO2 97 % 05/11/22 1326  Vitals shown include unvalidated device data.  Last Pain:  Vitals:   05/11/22 1324  TempSrc: Temporal  PainSc: 0-No pain         Complications: No notable events documented.

## 2022-05-11 NOTE — Discharge Instructions (Signed)
Flexible Bronchoscopy, Care After This sheet gives you information about how to care for yourself after your test. Your doctor may also give you more specific instructions. If you have problems or questions, contact your doctor. Follow these instructions at home: Eating and drinking When your numbness is gone and your cough and gag reflexes have come back, you may: Eat soft foods. Slowly drink liquids. The day after the test, go back to your normal diet. Driving Do not drive for 24 hours if you were given a medicine to help you relax (sedative). Do not drive or use heavy machinery while taking prescription pain medicine. General instructions  Take over-the-counter and prescription medicines only as told by your doctor. Return to your normal activities as told. Ask what activities are safe for you. Do not use any products that have nicotine or tobacco in them. This includes cigarettes and e-cigarettes. If you need help quitting, ask your doctor. Keep all follow-up visits as told by your doctor. This is important. It is very important if you had a tissue sample (biopsy) taken. Get help right away if: You have shortness of breath that gets worse. You get light-headed. You feel like you are going to pass out (faint). You have chest pain. You cough up: More than a little blood. More blood than before. Summary Do not eat or drink anything (not even water) for 2 hours after your test, or until your numbing medicine wears off. Do not use cigarettes. Do not use e-cigarettes. Get help right away if you have chest pain.  Please call our office for any questions or concerns.  [phone].  This information is not intended to replace advice given to you by your health care provider. Make sure you discuss any questions you have with your health care provider. Document Released: 05/17/2009 Document Revised: 07/02/2017 Document Reviewed: 08/07/2016 Elsevier Patient Education  2020 [employer].

## 2022-05-11 NOTE — Interval H&P Note (Signed)
History and Physical Interval Note:  05/11/2022 9:43 AM  Rica Records  has presented today for surgery, with the diagnosis of multiple pulmonary nodules.  The various methods of treatment have been discussed with the patient and family. After consideration of risks, benefits and other options for treatment, the patient has consented to  Procedure(s) with comments: ROBOTIC ASSISTED NAVIGATIONAL BRONCHOSCOPY (Bilateral) - ION w/ CIOS as a surgical intervention.  The patient's history has been reviewed, patient examined, no change in status, stable for surgery.  I have reviewed the patient's chart and labs.  Questions were answered to the patient's satisfaction.     Collene Gobble

## 2022-05-11 NOTE — Anesthesia Procedure Notes (Signed)
Procedure Name: Intubation Date/Time: 05/11/2022 11:47 AM  Performed by: Leonor Liv, CRNAPre-anesthesia Checklist: Patient identified, Emergency Drugs available, Suction available and Patient being monitored Patient Re-evaluated:Patient Re-evaluated prior to induction Oxygen Delivery Method: Circle System Utilized Preoxygenation: Pre-oxygenation with 100% oxygen Induction Type: IV induction Ventilation: Mask ventilation without difficulty Laryngoscope Size: Mac and 4 Grade View: Grade I Tube type: Oral Tube size: 8.5 mm Number of attempts: 1 Airway Equipment and Method: Stylet Placement Confirmation: ETT inserted through vocal cords under direct vision, positive ETCO2 and breath sounds checked- equal and bilateral Secured at: 23 cm Tube secured with: Tape Dental Injury: Teeth and Oropharynx as per pre-operative assessment

## 2022-05-12 ENCOUNTER — Encounter (HOSPITAL_COMMUNITY): Payer: Self-pay | Admitting: Emergency Medicine

## 2022-05-14 LAB — CYTOLOGY - NON PAP

## 2022-05-15 ENCOUNTER — Other Ambulatory Visit: Payer: Self-pay

## 2022-05-15 ENCOUNTER — Encounter: Payer: Self-pay | Admitting: Hematology

## 2022-05-15 ENCOUNTER — Inpatient Hospital Stay: Payer: Medicaid Other | Attending: Hematology | Admitting: Hematology

## 2022-05-15 VITALS — BP 118/88 | HR 91 | Temp 98.3°F | Resp 16 | Wt 148.8 lb

## 2022-05-15 DIAGNOSIS — Z933 Colostomy status: Secondary | ICD-10-CM | POA: Diagnosis not present

## 2022-05-15 DIAGNOSIS — Z9049 Acquired absence of other specified parts of digestive tract: Secondary | ICD-10-CM | POA: Insufficient documentation

## 2022-05-15 DIAGNOSIS — D649 Anemia, unspecified: Secondary | ICD-10-CM | POA: Diagnosis not present

## 2022-05-15 DIAGNOSIS — C7800 Secondary malignant neoplasm of unspecified lung: Secondary | ICD-10-CM | POA: Diagnosis not present

## 2022-05-15 DIAGNOSIS — C186 Malignant neoplasm of descending colon: Secondary | ICD-10-CM | POA: Diagnosis present

## 2022-05-15 DIAGNOSIS — C772 Secondary and unspecified malignant neoplasm of intra-abdominal lymph nodes: Secondary | ICD-10-CM | POA: Diagnosis not present

## 2022-05-15 DIAGNOSIS — R7401 Elevation of levels of liver transaminase levels: Secondary | ICD-10-CM | POA: Diagnosis not present

## 2022-05-15 DIAGNOSIS — F209 Schizophrenia, unspecified: Secondary | ICD-10-CM | POA: Diagnosis not present

## 2022-05-15 DIAGNOSIS — Z5111 Encounter for antineoplastic chemotherapy: Secondary | ICD-10-CM | POA: Diagnosis present

## 2022-05-15 DIAGNOSIS — Z79899 Other long term (current) drug therapy: Secondary | ICD-10-CM | POA: Diagnosis not present

## 2022-05-15 NOTE — Progress Notes (Addendum)
Matthew [name]   [phone] [phone]) [phone]   Clinic Follow up Note   Patient Care Team: Medicine, [facility] as PCP - General Clovis Riley, MD as Consulting Physician (General Surgery) Truitt Merle, MD as Consulting Physician (Oncology)  Date of Service:  05/15/2022  CHIEF COMPLAINT: f/u of colon cancer  CURRENT THERAPY:  Surveillance  ASSESSMENT & PLAN:  Matthew [name] is a 56 y.o. male with   1. Cancer of left colon, stage IIIB p(T3, N1aM0), MSS, lung and node metastasis in 04/2022 -presented to ED on 08/06/21 with recurrent upper abdominal pain. CT AP showed sigmoid colon perforation, with possible underlying malignancy. This was complicated with abdominal abscess, which required surgery and IV antibiotics.  -emergent partial colectomy on 08/08/21 by Dr. Windle Guard showed invasive moderately differentiated adenocarcinoma. Margins negative, one lymph node showed metastatic carcinoma (1/12). -Staging chest CT 08/15/21 was negative for metastatic disease. -he completed 4 cycles adjuvant CAPOX 10/10/21 - 12/25/21 (though he continued Xeloda through ~02/09/22 due to misunderstandings). -restaging PET scan on 04/08/22 showed: mild abdominal retroperitoneal lymphadenopathy is hypermetabolic; several sub-centimeter bilateral pulmonary nodules show hypermetabolism. -bronchoscopy on 05/11/22 by Dr. Lamonte Sakai confirmed metastatic colon cancer in lung -I reviewed the results with them today. I discussed this confirms metastatic disease and is therefore not curable. I again explained that he is not eligible for surgery due to the multiple sites of metastases (lung and RP nodes). I also again discussed that the main treatment would be chemotherapy.  -We had a long conversation about the goal of care. Due to his schizophrenia and cognitive deficiency, and the nature of his metastatic cancer, I think is reasonable to watch it for now and preserve his quality of life.  But if  the goal of therapy is prolong his life, we can try chemotherapy. -Patient's caregiver, his mother, has very low health literacy, and has difficulty understand the complexity of his medical condition.  -We connected with pt's sister [place], and I explained the situation with her. I also reviewed with her that I do not recommend any other oral chemo options given unreliability of medication management at home. Pt remains totally asymptomatic now. -they will take time to consider their options. I did let them know that I am available to answer any questions today then after I return on 10/23.   2. Anemia, likely iron deficient -chronic prior to diagnosis, worsened with surgery -he is currently on oral iron. -improving off treatment.   3. Schizophrenia, disabled, social support -he was diagnosed with schizophrenia in his 20's, managed with medication, he is on disability. -he lives with his mother; he is able to do ADLs for himself, but not iADLS. They are both on disability. His mother is also a poor historian. -he has a home care nurse that comes in.    4. Transaminitis -Probably related to Xeloda, he is asymptomatic -repeat 05/11/22 showed slight worsening from a month prior, now with t.bili also elevated at 1.4.     PLAN:  -We discussed her recent biopsy results, which confirmed metastatic colon cancer. -Patient's mother and the rest of family were discussed the options, and call me back with their decision about chemotherapy, versus observation for now. -I will request FO on his biopsy or previous surgical sample.   Addendum I spoke with pt's cousin Jamail Cullers, and explained everything above to her. She asked appropriate questions. She will talk to pt's mother and call back with their decisions.   Truitt Merle  No problem-specific Assessment & Plan notes found for this encounter.   SUMMARY OF ONCOLOGIC HISTORY: Oncology History Overview Note   Cancer Staging  Cancer of left  colon [facility]) Staging form: Colon and Rectum, AJCC 8th Edition - Pathologic stage from 08/08/2021: Stage IIIB (pT3, pN1a, cM0) - Signed by Truitt Merle, MD on 08/26/2021    Cancer of left colon ([place])  04/01/2020 Imaging   IMPRESSION: 1. Gallbladder decompressed bowel also partially calcified gallstones. No pericholecystic inflammation though if there is persisting clinical concern for cholecystitis right upper quadrant ultrasound could be obtained. 2. Circumferential thickening of the distal thoracic esophagus. Could reflect features of esophagitis. Correlate with clinical symptoms and consider endoscopy as clinically warranted. 3. Additional segmental thickening of the mid to distal sigmoid with focal narrowing. No acute surrounding inflammation or resulting obstruction. Findings are nonspecific, and could reflect sequela of prior inflammation/infection. However, recommend correlation with colonoscopy if not recently performed. 4. Mild circumferential bladder wall thickening and indentation of the bladder base by an enlarged prostate. Possibly sequela of chronic outlet obstruction though could correlate with urinalysis to exclude cystitis. 5. Aortic Atherosclerosis (ICD10-I70.0).   08/06/2021 Imaging   IMPRESSION: Sigmoid colonic perforation with small free intraperitoneal gas and infiltration of the mesenteric and omental fat in keeping with changes of peritonitis.   Long segment inflammatory stranding of the sigmoid colon in keeping with a severe infectious or inflammatory colitis. This terminates an area of irregular mural thickening, infiltrative soft tissue within the colonic mesentery, and focal dystrophic calcification. This may represent a chronic inflammatory process, however, a perforated malignancy could appear similarly. There are 2 separate points of perforation which again raise the question of an underlying malignancy.   7.1 cm gas and fluid containing pericolonic abscess  within the sigmoid mesentery.   Marked inflammatory change of the terminal ileum adjacent to the pericolonic abscess with resultant small bowel obstruction. Fluid within the distal esophagus likely relates to gastroesophageal reflux the setting of vomiting.   Aortic Atherosclerosis (ICD10-I70.0).   08/08/2021 Cancer Staging   Staging form: Colon and Rectum, AJCC 8th Edition - Pathologic stage from 08/08/2021: Stage IIIB (pT3, pN1a, cM0) - Signed by Truitt Merle, MD on 08/26/2021 Stage prefix: Initial diagnosis Total positive nodes: 1 Histologic grading system: 4 grade system Histologic grade (G): G2 Residual tumor (R): R0 - None   08/08/2021 Definitive Surgery   FINAL MICROSCOPIC DIAGNOSIS:   A. COLON, SIGMOID, PARTIAL COLECTOMY:  - Invasive moderately differentiated adenocarcinoma.  - Metastatic carcinoma involving one of twelve lymph nodes (1/12).  - See oncology table below.   ADDENDUM:  Mismatch Repair Protein (I[facility])  SUMMARY INTERPRETATION: NORMAL    08/14/2021 Imaging   EXAM: CT ABDOMEN AND PELVIS WITH CONTRAST  IMPRESSION: 1. Post recent sigmoid colectomy with left lower quadrant colostomy. Two small residual foci of air within the pelvic mesentery with mild adjacent thickening, but no abscess or drainable collection. Trace non organized free fluid and stranding in the pelvis. 2. Short segment of small bowel wall thickening and inflammation in the pelvis involving the distal ileum, likely reactive. 3. Dilated distal esophagus, stomach, and small bowel, without discrete transition point, favoring postoperative ileus. 4. Small bilateral pleural effusions and compressive atelectasis. 5. Heterogeneous partially enhancing 14 mm lymph node in the retroperitoneum at the aortoiliac bifurcation, not significantly changed from prior exam. Suspected additional lymph nodes in the anterior common iliac space, not significantly changed from prior exam. Recommend attention at follow-up. 6.  Additional chronic findings as described.  08/15/2021 Imaging   EXAM: CT CHEST WITH CONTRAST  IMPRESSION: 1. No evidence of thoracic metastasis. 2. Bilateral small layering pleural effusions with passive atelectasis   08/26/2021 Initial Diagnosis   Cancer of left colon ([place])   10/10/2021 - 12/12/2021 Chemotherapy   Patient is on Treatment Plan : COLORECTAL Xelox (Capeox) q21d     05/22/2022 -  Chemotherapy   Patient is on Treatment Plan : COLORECTAL FOLFIRI + Bevacizumab q14d        INTERVAL HISTORY:  Matthew [name] is here for a follow up of colon cancer. He was last seen by me on 04/10/22. He presents to the clinic accompanied by his mother. He reports he is doing well, denies pain.   All other systems were reviewed with the patient and are negative.  MEDICAL HISTORY:  Past Medical History:  Diagnosis Date   Cancer ([place])    Schizophrenia [facility])     SURGICAL HISTORY: Past Surgical History:  Procedure Laterality Date   BRONCHIAL BIOPSY  05/11/2022   Procedure: BRONCHIAL BIOPSIES;  Surgeon: Collene Gobble, MD;  Location: [facility] ENDOSCOPY;  Service: Pulmonary;;   BRONCHIAL BRUSHINGS  05/11/2022   Procedure: BRONCHIAL BRUSHINGS;  Surgeon: Collene Gobble, MD;  Location: [facility] ENDOSCOPY;  Service: Pulmonary;;   BRONCHIAL NEEDLE ASPIRATION BIOPSY  05/11/2022   Procedure: BRONCHIAL NEEDLE ASPIRATION BIOPSIES;  Surgeon: Collene Gobble, MD;  Location: [place];  Service: Pulmonary;;   BRONCHIAL WASHINGS  05/11/2022   Procedure: BRONCHIAL WASHINGS;  Surgeon: Collene Gobble, MD;  Location: [facility] ENDOSCOPY;  Service: Pulmonary;;   LAPAROTOMY N/A 08/08/2021   Procedure: EXPLORATORY LAPAROTOMY;  Surgeon: Clovis Riley, MD;  Location: [place];  Service: General;  Laterality: N/A;   PARTIAL COLECTOMY N/A 08/08/2021   Procedure: PARTIAL COLECTOMY WITH COLOSTOMY;  Surgeon: Clovis Riley, MD;  Location: [place];  Service: General;  Laterality: N/A;   VIDEO BRONCHOSCOPY WITH RADIAL ENDOBRONCHIAL  ULTRASOUND  05/11/2022   Procedure: VIDEO BRONCHOSCOPY WITH RADIAL ENDOBRONCHIAL ULTRASOUND;  Surgeon: Collene Gobble, MD;  Location: [facility] ENDOSCOPY;  Service: Pulmonary;;    I have reviewed the social history and family history with the patient and they are unchanged from previous note.  ALLERGIES:  has No Known Allergies.  MEDICATIONS:  Current Outpatient Medications  Medication Sig Dispense Refill   acetaminophen (TYLENOL) 500 MG tablet Take 2 tablets (1,000 mg total) by mouth every 6 (six) hours as needed for mild pain or moderate pain.  0   aspirin EC 81 MG tablet Take 81 mg by mouth daily as needed (for pain or headaches).      benztropine (COGENTIN) 1 MG tablet Take 1 mg at bedtime by mouth.     ferrous sulfate 325 (65 FE) MG EC tablet Take 1 tablet (325 mg total) by mouth daily. (Patient not taking: Reported on 05/07/2022) 30 tablet 3   Multiple Vitamin (MULTIVITAMIN WITH MINERALS) TABS tablet Take 1 tablet by mouth daily.     paliperidone (INVEGA SUSTENNA) 156 MG/ML SUSP injection Inject 156 mg every 30 (thirty) days into the muscle.     polyethylene glycol (MIRALAX / GLYCOLAX) 17 g packet Take 17 g by mouth daily as needed for mild constipation or moderate constipation. (Patient not taking: Reported on 05/07/2022)  0   No current facility-administered medications for this visit.    PHYSICAL EXAMINATION: ECOG PERFORMANCE STATUS: 0 - Asymptomatic  Vitals:   05/15/22 0809  BP: 118/88  Pulse: 91  Resp: 16  Temp: 98.3 F (  36.8 C)  SpO2: 100%   Wt Readings from Last 3 Encounters:  05/15/22 148 lb 12.8 oz (67.5 kg)  05/11/22 144 lb (65.3 kg)  05/06/22 148 lb 9.6 oz (67.4 kg)     GENERAL:alert, no distress and comfortable SKIN: skin color normal, no rashes or significant lesions EYES: normal, Conjunctiva are pink and non-injected, sclera clear  NEURO: alert & oriented x 3 with fluent speech  LABORATORY DATA:  I have reviewed the data as listed    Latest Ref Rng & Units  05/11/2022    8:01 AM 04/08/2022    9:26 AM 02/23/2022    1:56 PM  CBC  WBC 4.0 - 10.5 K/uL 5.2  4.4  4.1   Hemoglobin 13.0 - 17.0 g/dL 13.8  12.6  12.4   Hematocrit 39.0 - 52.0 % 42.3  37.3  36.2   Platelets 150 - 400 K/uL 232  230  212         Latest Ref Rng & Units 05/11/2022    8:01 AM 04/08/2022    9:26 AM 02/23/2022    1:56 PM  CMP  Glucose 70 - 99 mg/dL 94  144  97   BUN 6 - 20 mg/dL _0 Creatinine 0.61 - 1.24 mg/dL 1.09  0.99  0.99   Sodium 135 - 145 mmol/L 141  140  140   Potassium 3.5 - 5.1 mmol/L 4.3  3.6  3.9   Chloride 98 - 111 mmol/L 107  109  108   CO2 22 - 32 mmol/L _1 Calcium 8.9 - 10.3 mg/dL 9.9  9.0  9.6   Total Protein 6.5 - 8.1 g/dL 7.4  7.3  7.4   Total Bilirubin 0.3 - 1.2 mg/dL 1.4  0.8  0.8   Alkaline Phos 38 - 126 U/L 156  182  213   AST 15 - 41 U/L 74  46  43   ALT 0 - 44 U/L 90  64  40       RADIOGRAPHIC STUDIES: I have personally reviewed the radiological images as listed and agreed with the findings in the report. No results found.    No orders of the defined types were placed in this encounter.  All questions were answered. The patient knows to call the clinic with any problems, questions or concerns. No barriers to learning was detected. The total time spent in the appointment was 40 minutes.     Truitt Merle, MD 05/15/2022   I, Wilburn Mylar, am acting as scribe for Truitt Merle, MD.   I have reviewed the above documentation for accuracy and completeness, and I agree with the above.

## 2022-05-15 NOTE — Progress Notes (Signed)
DISCONTINUE ON PATHWAY REGIMEN - Colorectal     A cycle is every 21 days:     Capecitabine      Oxaliplatin   **Always confirm dose/schedule in your pharmacy ordering system**  REASON: Disease Progression PRIOR TREATMENT: COS82: CapeOx q21 Days x 4 Cycles TREATMENT RESPONSE: Unable to Evaluate  START ON PATHWAY REGIMEN - Colorectal     A cycle is every 14 days:     Bevacizumab-xxxx      Irinotecan      Leucovorin      Fluorouracil      Fluorouracil   **Always confirm dose/schedule in your pharmacy ordering system**  Patient Characteristics: Distant Metastases, Nonsurgical Candidate, KRAS/NRAS Mutation Positive/Unknown (BRAF V600 Wild-Type/Unknown), Standard Cytotoxic Therapy, Second Line Standard Cytotoxic Therapy, Bevacizumab Eligible Tumor Location: Colon Therapeutic Status: Distant Metastases Microsatellite/Mismatch Repair Status: MSS/pMMR BRAF Mutation Status: Awaiting Test Results KRAS/NRAS Mutation Status: Awaiting Test Results Preferred Therapy Approach: Standard Cytotoxic Therapy Standard Cytotoxic Line of Therapy: Second Line Standard Cytotoxic Therapy Bevacizumab Eligibility: Eligible Intent of Therapy: Non-Curative / Palliative Intent, Discussed with Patient 

## 2022-05-15 NOTE — Progress Notes (Signed)
[facility] ordered on pt's Lung biopsy [id number], [id number].  Order#[id number] done on 05/15/2022 electronically submitted and faxed with pathology report.  Notified Altamese Dilling and Farrell in the [facility] Pathology Dept of [place] order request via email.

## 2022-05-18 ENCOUNTER — Other Ambulatory Visit: Payer: Self-pay

## 2022-05-18 ENCOUNTER — Other Ambulatory Visit: Payer: Self-pay | Admitting: Nurse Practitioner

## 2022-05-18 DIAGNOSIS — C186 Malignant neoplasm of descending colon: Secondary | ICD-10-CM

## 2022-05-18 MED ORDER — ONDANSETRON HCL 8 MG PO TABS: 8.0000 mg | ORAL_TABLET | Freq: Three times a day (TID) | ORAL | 1 refills | Status: DC | PRN

## 2022-05-18 MED ORDER — LIDOCAINE-PRILOCAINE 2.5-2.5 % EX CREA: TOPICAL_CREAM | CUTANEOUS | 3 refills | Status: DC

## 2022-05-18 MED ORDER — PROCHLORPERAZINE MALEATE 10 MG PO TABS: 10.0000 mg | ORAL_TABLET | Freq: Four times a day (QID) | ORAL | 1 refills | Status: DC | PRN

## 2022-05-21 ENCOUNTER — Other Ambulatory Visit: Payer: Self-pay

## 2022-05-21 ENCOUNTER — Encounter: Payer: Self-pay | Admitting: Hematology

## 2022-05-21 ENCOUNTER — Telehealth: Payer: Self-pay

## 2022-05-21 NOTE — Telephone Encounter (Signed)
Pt's mom "Mattie" called wanting to know how long it will take to have the pt's Port placed so she will know how long she can expect to be at [facility] with the patient.  Informed Mattie that this RN does not know but will contact IR who will be placing the pt's port.  Contacted Tiffany in IR and asked if she could contact the pt's mother to inform her on how long the port placement procedure will take.  Tiffany stated she will call the pt's mom with the information requested.

## 2022-05-21 NOTE — Progress Notes (Signed)
Patient's mother Matthew [name] left a voicemail on my phone regarding transportation for Monday to his appointments.  Secure chat sent to [facility] to follow up and assist.  She has my card for any additional financial questions or concerns.

## 2022-05-22 ENCOUNTER — Other Ambulatory Visit: Payer: Self-pay | Admitting: Student

## 2022-05-22 DIAGNOSIS — C186 Malignant neoplasm of descending colon: Secondary | ICD-10-CM

## 2022-05-25 ENCOUNTER — Other Ambulatory Visit: Payer: Self-pay | Admitting: Hematology

## 2022-05-25 ENCOUNTER — Ambulatory Visit (HOSPITAL_COMMUNITY)
Admission: RE | Admit: 2022-05-25 | Discharge: 2022-05-25 | Disposition: A | Discharge status: Home / Self Care | Payer: Medicaid Other | Source: Ambulatory Visit | Attending: Hematology | Admitting: Hematology

## 2022-05-25 ENCOUNTER — Encounter (HOSPITAL_COMMUNITY): Payer: Self-pay

## 2022-05-25 DIAGNOSIS — C189 Malignant neoplasm of colon, unspecified: Secondary | ICD-10-CM | POA: Insufficient documentation

## 2022-05-25 DIAGNOSIS — Z933 Colostomy status: Secondary | ICD-10-CM | POA: Insufficient documentation

## 2022-05-25 DIAGNOSIS — I700 Atherosclerosis of aorta: Secondary | ICD-10-CM | POA: Diagnosis not present

## 2022-05-25 DIAGNOSIS — Z9049 Acquired absence of other specified parts of digestive tract: Secondary | ICD-10-CM | POA: Diagnosis not present

## 2022-05-25 DIAGNOSIS — F209 Schizophrenia, unspecified: Secondary | ICD-10-CM | POA: Diagnosis not present

## 2022-05-25 DIAGNOSIS — C186 Malignant neoplasm of descending colon: Secondary | ICD-10-CM

## 2022-05-25 HISTORY — PX: IR IMAGING GUIDED PORT INSERTION: IMG5740

## 2022-05-25 MED ORDER — SODIUM CHLORIDE 0.9 % IV SOLN: INTRAVENOUS | Status: DC

## 2022-05-25 MED ORDER — FENTANYL CITRATE (PF) 100 MCG/2ML IJ SOLN
INTRAMUSCULAR | Status: AC | PRN
  Administered 2022-05-25 (×2): 50 ug via INTRAVENOUS

## 2022-05-25 MED ORDER — LIDOCAINE-EPINEPHRINE 1 %-1:100000 IJ SOLN
INTRAMUSCULAR | Status: AC
  Administered 2022-05-25: 20 mL via SUBCUTANEOUS
  Filled 2022-05-25: qty 1

## 2022-05-25 MED ORDER — MIDAZOLAM HCL 2 MG/2ML IJ SOLN
INTRAMUSCULAR | Status: AC
  Filled 2022-05-25: qty 4

## 2022-05-25 MED ORDER — MIDAZOLAM HCL 2 MG/2ML IJ SOLN
INTRAMUSCULAR | Status: AC | PRN
  Administered 2022-05-25 (×2): 1 mg via INTRAVENOUS

## 2022-05-25 MED ORDER — HEPARIN SOD (PORK) LOCK FLUSH 100 UNIT/ML IV SOLN
INTRAVENOUS | Status: AC
  Administered 2022-05-25: 500 [IU] via INTRAVENOUS
  Filled 2022-05-25: qty 5

## 2022-05-25 MED ORDER — FENTANYL CITRATE (PF) 100 MCG/2ML IJ SOLN
INTRAMUSCULAR | Status: AC
  Filled 2022-05-25: qty 4

## 2022-05-25 NOTE — Procedures (Signed)
Vascular and Interventional Radiology Procedure Note  Patient: Matthew [name] DOB: [birth year] Medical Record Number: [id number] Note Date/Time: 05/25/22 1:42 PM   Performing Physician: Michaelle Birks, MD Assistant(s): None  Diagnosis: Colon cancer  Procedure: PORT PLACEMENT  Anesthesia: Conscious Sedation Complications: None Estimated Blood Loss: Minimal  Findings:  Successful right-sided port placement, with the tip of the catheter in the proximal right atrium.  Plan: Catheter ready for use.  See detailed procedure note with images in PACS. The patient tolerated the procedure well without incident or complication and was returned to Short Stay in stable condition.    Michaelle Birks, MD [facility] Radiology   Pager. [place]

## 2022-05-25 NOTE — Consult Note (Signed)
Chief Complaint: Patient was seen in consultation today for  port a cath placement  Referring Physician(s): Feng,Yan  Supervising Physician: Michaelle Birks  Patient Status: [facility] - Out-pt  History of Present Illness: Matthew [name] is a 56 y.o. male with past medical history of schizophrenia and metastatic colon cancer diagnosed in January of this year, status post partial colectomy with colostomy along with chemotherapy.  PET scan on 04/09/2022 revealed:  Mild abdominal retroperitoneal lymphadenopathy is stable in size, but is hypermetabolic consistent with metastatic disease.   Several sub-cm bilateral pulmonary nodules show hypermetabolism, 1 in the right middle lobe showing interval increase in size. These are highly suspicious for pulmonary metastases.   Aortic Atherosclerosis  Patient presents today for Port-A-Cath placement to assist with treatment.  Past Medical History:  Diagnosis Date   Cancer ([place])    Schizophrenia [facility])     Past Surgical History:  Procedure Laterality Date   BRONCHIAL BIOPSY  05/11/2022   Procedure: BRONCHIAL BIOPSIES;  Surgeon: Collene Gobble, MD;  Location: [facility] ENDOSCOPY;  Service: Pulmonary;;   BRONCHIAL BRUSHINGS  05/11/2022   Procedure: BRONCHIAL BRUSHINGS;  Surgeon: Collene Gobble, MD;  Location: [facility] ENDOSCOPY;  Service: Pulmonary;;   BRONCHIAL NEEDLE ASPIRATION BIOPSY  05/11/2022   Procedure: BRONCHIAL NEEDLE ASPIRATION BIOPSIES;  Surgeon: Collene Gobble, MD;  Location: [place];  Service: Pulmonary;;   BRONCHIAL WASHINGS  05/11/2022   Procedure: BRONCHIAL WASHINGS;  Surgeon: Collene Gobble, MD;  Location: [place];  Service: Pulmonary;;   LAPAROTOMY N/A 08/08/2021   Procedure: EXPLORATORY LAPAROTOMY;  Surgeon: Clovis Riley, MD;  Location: [place];  Service: General;  Laterality: N/A;   PARTIAL COLECTOMY N/A 08/08/2021   Procedure: PARTIAL COLECTOMY WITH COLOSTOMY;  Surgeon: Clovis Riley, MD;  Location: [place];  Service: General;   Laterality: N/A;   VIDEO BRONCHOSCOPY WITH RADIAL ENDOBRONCHIAL ULTRASOUND  05/11/2022   Procedure: VIDEO BRONCHOSCOPY WITH RADIAL ENDOBRONCHIAL ULTRASOUND;  Surgeon: Collene Gobble, MD;  Location: [facility] ENDOSCOPY;  Service: Pulmonary;;    Allergies: Patient has no known allergies.  Medications: Prior to Admission medications   Medication Sig Start Date End Date Taking? Authorizing Provider  benztropine (COGENTIN) 1 MG tablet Take 1 mg at bedtime by mouth.   Yes [provider]  paliperidone (INVEGA SUSTENNA) 156 MG/ML SUSP injection Inject 156 mg every 30 (thirty) days into the muscle.   Yes [provider]  acetaminophen (TYLENOL) 500 MG tablet Take 2 tablets (1,000 mg total) by mouth every 6 (six) hours as needed for mild pain or moderate pain. 08/22/21   Winferd Humphrey, PA-C  aspirin EC 81 MG tablet Take 81 mg by mouth daily as needed (for pain or headaches).     [provider]  ferrous sulfate 325 (65 FE) MG EC tablet Take 1 tablet (325 mg total) by mouth daily. Patient not taking: Reported on 05/07/2022 08/27/21   Truitt Merle, MD  lidocaine-prilocaine (EMLA) cream Apply to affected area once 05/18/22   Truitt Merle, MD  Multiple Vitamin (MULTIVITAMIN WITH MINERALS) TABS tablet Take 1 tablet by mouth daily. 08/23/21   Winferd Humphrey, PA-C  ondansetron (ZOFRAN) 8 MG tablet Take 1 tablet (8 mg total) by mouth every 8 (eight) hours as needed for nausea, vomiting or refractory nausea / vomiting. Start on the third day after chemotherapy. 05/18/22   Truitt Merle, MD  polyethylene glycol (MIRALAX / GLYCOLAX) 17 g packet Take 17 g by mouth daily as needed for mild constipation  or moderate constipation. Patient not taking: Reported on 05/07/2022 08/22/21   Winferd Humphrey, PA-C  prochlorperazine (COMPAZINE) 10 MG tablet Take 1 tablet (10 mg total) by mouth every 6 (six) hours as needed for nausea or vomiting. 05/18/22   Truitt Merle, MD     Family History  Problem Relation Age  of Onset   Diabetes Father    Heart attack Father    Cancer Other        unknown type cancer   Cancer Cousin        breast cancer   Colon cancer Neg Hx     Social History   Socioeconomic History   Marital status: Single    Spouse name: Not on file   Number of children: 0   Years of education: Not on file   Highest education level: Not on file  Occupational History   Not on file  Tobacco Use   Smoking status: Never   Smokeless tobacco: Not on file  Vaping Use   Vaping Use: Never used  Substance and Sexual Activity   Alcohol use: Not Currently   Drug use: Never   Sexual activity: Not on file  Other Topics Concern   Not on file  Social History Narrative   Not on file   Social Determinants of Health   Financial Resource Strain: Not on file  Food Insecurity: Not on file  Transportation Needs: Unmet Transportation Needs (02/09/2022)   PRAPARE - Hydrologist (Medical): Yes    Lack of Transportation (Non-Medical): Yes  Physical Activity: Not on file  Stress: Not on file  Social Connections: Not on file      Review of Systems currently denies fever, headache, chest pain, dyspnea, cough, abdominal pain, back pain, nausea, vomiting or bleeding.  Vital Signs: BP 120/75   Pulse 85   Temp 98.8 F (37.1 C)   Resp 16   SpO2 92%     Physical Exam patient awake, answers simple questions okay.  Mother in room.  Chest clear to auscultation bilaterally.  Heart with regular rate and rhythm.  Abdomen soft, positive bowel sounds, intact left sided colostomy; no lower extremity edema.  Imaging: CT Super D Chest Wo Contrast  Result Date: 05/12/2022 CLINICAL DATA:  Lung nodules.  Colon cancer.  * Tracking Code: BO * EXAM: CT CHEST WITHOUT CONTRAST TECHNIQUE: Multidetector CT imaging of the chest was performed using thin slice collimation for electromagnetic bronchoscopy planning purposes, without intravenous contrast. RADIATION DOSE REDUCTION: This  exam was performed according to the departmental dose-optimization program which includes automated exposure control, adjustment of the mA and/or kV according to patient size and/or use of iterative reconstruction technique. COMPARISON:  02/05/2022, PET 04/08/2022, CT chest 02/05/2022, 08/15/2021. FINDINGS: Cardiovascular: Left anterior descending coronary artery calcification. Heart size normal. No pericardial effusion. Mediastinum/Nodes: No pathologically enlarged mediastinal or axillary lymph nodes. Hilar regions are difficult to definitively evaluate without IV contrast. Esophagus is grossly unremarkable. Lungs/Pleura: Image quality is degraded by respiratory motion. Scattered pulmonary nodules have increased in size slightly. Index lateral segment right middle lobe nodule measures 7 mm, previously 5 mm on 02/05/2022 and 4 mm on 08/15/2021. Minimal dependent atelectasis bilaterally. 3 mm apical left upper lobe nodule (8/73), new from 02/05/2022. Minimal dependent atelectasis. No pleural fluid. Airway is unremarkable. Upper Abdomen: Visualized portion of the liver is unremarkable. Stones in the gallbladder. Visualized portions of the adrenal glands and right kidney are unremarkable. Partially imaged 4.3 cm fluid density  lesion in the left kidney. No specific follow-up necessary. Visualized portions of the spleen, pancreas, stomach and bowel are grossly unremarkable. No upper abdominal adenopathy. Musculoskeletal: No worrisome lytic or sclerotic lesions. IMPRESSION: 1. New and enlarging bilateral pulmonary nodules, indicative of metastatic disease. 2. Left anterior descending coronary artery calcification. 3. Cholelithiasis. Electronically Signed   By: Lorin Picket M.D.   On: 05/12/2022 10:59   DG Chest Port 1 View  Result Date: 05/11/2022 CLINICAL DATA:  Status post bronchoscopy with biopsy. EXAM: PORTABLE CHEST 1 VIEW COMPARISON:  08/07/2021.  Chest CT today.  PET CT 04/08/2022. FINDINGS: Heart is normal  size. Mediastinal contours within normal limits. No confluent airspace opacities, effusions or pneumothorax. No acute bony abnormality. IMPRESSION: No active disease. Electronically Signed   By: Rolm Baptise M.D.   On: 05/11/2022 13:56   DG C-ARM BRONCHOSCOPY  Result Date: 05/11/2022 C-ARM BRONCHOSCOPY: Fluoroscopy was utilized by the requesting physician.  No radiographic interpretation.    Labs:  CBC: Recent Labs    02/05/22 0923 02/23/22 1356 04/08/22 0926 05/11/22 0801  WBC 3.7* 4.1 4.4 5.2  HGB 11.6* 12.4* 12.6* 13.8  HCT 34.9* 36.2* 37.3* 42.3  PLT 187 212 230 232    COAGS: No results for input(s): "INR", "APTT" in the last 8760 hours.  BMP: Recent Labs    02/05/22 0923 02/23/22 1356 04/08/22 0926 05/11/22 0801  NA 140 140 140 141  K 3.8 3.9 3.6 4.3  CL 107 108 109 107  CO2 26 26 23 25   GLUCOSE 86 97 144* 94  BUN 13 17 13 20   CALCIUM 9.5 9.6 9.0 9.9  CREATININE 0.92 0.99 0.99 1.09  GFRNONAA >60 >60 >60 >60    LIVER FUNCTION TESTS: Recent Labs    02/05/22 0923 02/23/22 1356 04/08/22 0926 05/11/22 0801  BILITOT 0.8 0.8 0.8 1.4*  AST 109* 43* 46* 74*  ALT 110* 40 64* 90*  ALKPHOS 285* 213* 182* 156*  PROT 7.5 7.4 7.3 7.4  ALBUMIN 4.1 4.3 4.3 4.0    TUMOR MARKERS: No results for input(s): "AFPTM", "CEA", "CA199", "CHROMGRNA" in the last 8760 hours.  Assessment and Plan: 56 y.o. male with past medical history of schizophrenia and metastatic colon cancer diagnosed in January of this year, status post partial colectomy with colostomy along with chemotherapy.  PET scan on 04/09/2022 revealed:  Mild abdominal retroperitoneal lymphadenopathy is stable in size, but is hypermetabolic consistent with metastatic disease.   Several sub-cm bilateral pulmonary nodules show hypermetabolism, 1 in the right middle lobe showing interval increase in size. These are highly suspicious for pulmonary metastases.   Aortic Atherosclerosis  Patient presents today for  Port-A-Cath placement to assist with treatment.Risks and benefits of image guided port-a-catheter placement was discussed with the patient/mother including, but not limited to bleeding, infection, pneumothorax, or fibrin sheath development and need for additional procedures.  All of the patient's questions were answered, patient is agreeable to proceed. Consent signed and in chart.    Thank you for this interesting consult.  I greatly enjoyed meeting Laymon Stockert and look forward to participating in their care.  A copy of this report was sent to the requesting provider on this date.  Electronically Signed: D. Rowe Robert, PA-C 05/25/2022, 1:11 PM   I spent a total of  25 minutes   in face to face in clinical consultation, greater than 50% of which was counseling/coordinating care for port a cath placement

## 2022-05-25 NOTE — Discharge Instructions (Signed)
Discharge Instructions:   Please call Interventional Radiology clinic [phone] with any questions or concerns.  You may remove your dressing and shower tomorrow.  Do not use EMLA / Lidocaine cream for 2 weeks post Port Insertion.  Do not submerge into water.   Implanted Port Insertion, Care After The following information offers guidance on how to care for yourself after your procedure. Your health care provider may also give you more specific instructions. If you have problems or questions, contact your health care provider. What can I expect after the procedure? After the procedure, it is common to have: Discomfort at the port insertion site. Bruising on the skin over the port. This should improve over 3-4 days. Follow these instructions at home: [facility] care After your port is placed, you will get a manufacturer's information card. The card has information about your port. Keep this card with you at all times. Take care of the port as told by your health care provider. Ask your health care provider if you or a family member can get training for taking care of the port at home. A home health care nurse will be be available to help care for the port. Make sure to remember what type of port you have. Incision care     Follow instructions from your health care provider about how to take care of your port insertion site. Make sure you: Wash your hands with soap and water for at least 20 seconds before and after you change your bandage (dressing). If soap and water are not available, use hand sanitizer. Change your dressing as told by your health care provider. Leave stitches (sutures), skin glue, or adhesive strips in place. These skin closures may need to stay in place for 2 weeks or longer. If adhesive strip edges start to loosen and curl up, you may trim the loose edges. Do not remove adhesive strips completely unless your health care provider tells you to do that. Check your port  insertion site every day for signs of infection. Check for: Redness, swelling, or pain. Fluid or blood. Warmth. Pus or a bad smell. Activity Return to your normal activities as told by your health care provider. Ask your health care provider what activities are safe for you. You may have to avoid lifting. Ask your health care provider how much you can safely lift. General instructions Take over-the-counter and prescription medicines only as told by your health care provider. Do not take baths, swim, or use a hot tub until your health care provider approves. Ask your health care provider if you may take showers. You may only be allowed to take sponge baths. If you were given a sedative during the procedure, it can affect you for several hours. Do not drive or operate machinery until your health care provider says that it is safe. Wear a medical alert bracelet in case of an emergency. This will tell any health care providers that you have a port. Keep all follow-up visits. This is important. Contact a health care provider if: You cannot flush your port with saline as directed, or you cannot draw blood from the port. You have a fever or chills. You have redness, swelling, or pain around your port insertion site. You have fluid or blood coming from your port insertion site. Your port insertion site feels warm to the touch. You have pus or a bad smell coming from the port insertion site. Get help right away if: You have chest pain or shortness of  breath. You have bleeding from your port that you cannot control. These symptoms may be an emergency. Get help right away. Call 911. Do not wait to see if the symptoms will go away. Do not drive yourself to the hospital. Summary Take care of the port as told by your health care provider. Keep the manufacturer's information card with you at all times. Change your dressing as told by your health care provider. Contact a health care provider if you  have a fever or chills or if you have redness, swelling, or pain around your port insertion site. Keep all follow-up visits. This information is not intended to replace advice given to you by your health care provider. Make sure you discuss any questions you have with your health care provider. Document Revised: 01/21/2021 Document Reviewed: 01/21/2021 Elsevier Patient Education  [place].    Moderate Conscious Sedation, Adult, Care After This sheet gives you information about how to care for yourself after your procedure. Your health care provider may also give you more specific instructions. If you have problems or questions, contact your health care provider. What can I expect after the procedure? After the procedure, it is common to have: Sleepiness for several hours. Impaired judgment for several hours. Difficulty with balance. Vomiting if you eat too soon. Follow these instructions at home: For the time period you were told by your health care provider:  Rest. Do not participate in activities where you could fall or become injured. Do not drive or use machinery. Do not drink alcohol. Do not take sleeping pills or medicines that cause drowsiness. Do not make important decisions or sign legal documents. Do not take care of children on your own. Eating and drinking  Follow the diet recommended by your health care provider. Drink enough fluid to keep your urine pale yellow. If you vomit: Drink water, juice, or soup when you can drink without vomiting. Make sure you have little or no nausea before eating solid foods. General instructions Take over-the-counter and prescription medicines only as told by your health care provider. Have a responsible adult stay with you for the time you are told. It is important to have someone help care for you until you are awake and alert. Do not smoke. Keep all follow-up visits as told by your health care provider. This is  important. Contact a health care provider if: You are still sleepy or having trouble with balance after 24 hours. You feel light-headed. You keep feeling nauseous or you keep vomiting. You develop a rash. You have a fever. You have redness or swelling around the IV site. Get help right away if: You have trouble breathing. You have new-onset confusion at home. Summary After the procedure, it is common to feel sleepy, have impaired judgment, or feel nauseous if you eat too soon. Rest after you get home. Know the things you should not do after the procedure. Follow the diet recommended by your health care provider and drink enough fluid to keep your urine pale yellow. Get help right away if you have trouble breathing or new-onset confusion at home. This information is not intended to replace advice given to you by your health care provider. Make sure you discuss any questions you have with your health care provider. Document Revised: 11/17/2019 Document Reviewed: 06/15/2019

## 2022-05-25 NOTE — Progress Notes (Signed)
1650 Ice pack sent home to use for comfort to right upper neck and right upper chest.

## 2022-05-26 ENCOUNTER — Telehealth: Payer: Self-pay

## 2022-05-26 ENCOUNTER — Telehealth: Payer: Self-pay | Admitting: Hematology

## 2022-05-26 ENCOUNTER — Inpatient Hospital Stay: Payer: Medicaid Other | Admitting: Hematology

## 2022-05-26 ENCOUNTER — Inpatient Hospital Stay: Payer: Medicaid Other

## 2022-05-26 ENCOUNTER — Encounter (HOSPITAL_COMMUNITY): Payer: Self-pay | Admitting: Hematology

## 2022-05-26 DIAGNOSIS — C7800 Secondary malignant neoplasm of unspecified lung: Secondary | ICD-10-CM | POA: Insufficient documentation

## 2022-05-26 NOTE — Telephone Encounter (Signed)
Spoke with pt's mom via telephone to inform her that the pt was scheduled for f/u with Dr. Burr Medico today.  Pt's mom stated she was not aware of the appt scheduled for today.  Rescheduled pt's appt to 05/27/2022 while pt was on the telephone.  Arranged with [employer] Reaper to have transportation to pick up both pt and mom for appts scheduled this week.  Pt's mom confirmed appt date and time.

## 2022-05-26 NOTE — Telephone Encounter (Signed)
Left message 2x about upcoming appts

## 2022-05-27 ENCOUNTER — Other Ambulatory Visit: Payer: Self-pay

## 2022-05-27 ENCOUNTER — Encounter: Payer: Self-pay | Admitting: Hematology

## 2022-05-27 ENCOUNTER — Inpatient Hospital Stay: Payer: Medicaid Other

## 2022-05-27 ENCOUNTER — Inpatient Hospital Stay (HOSPITAL_BASED_OUTPATIENT_CLINIC_OR_DEPARTMENT_OTHER): Payer: Medicaid Other | Admitting: Hematology

## 2022-05-27 VITALS — BP 119/91 | HR 88 | Temp 98.1°F | Resp 19 | Ht 69.0 in | Wt 150.8 lb

## 2022-05-27 DIAGNOSIS — C7800 Secondary malignant neoplasm of unspecified lung: Secondary | ICD-10-CM

## 2022-05-27 DIAGNOSIS — C189 Malignant neoplasm of colon, unspecified: Secondary | ICD-10-CM

## 2022-05-27 DIAGNOSIS — Z5111 Encounter for antineoplastic chemotherapy: Secondary | ICD-10-CM | POA: Diagnosis not present

## 2022-05-27 DIAGNOSIS — C186 Malignant neoplasm of descending colon: Secondary | ICD-10-CM

## 2022-05-27 LAB — CBC WITH DIFFERENTIAL (CANCER CENTER ONLY)
Abs Immature Granulocytes: 0 10*3/uL (ref 0.00–0.07)
Basophils Absolute: 0 10*3/uL (ref 0.0–0.1)
Basophils Relative: 1 %
Eosinophils Absolute: 0.3 10*3/uL (ref 0.0–0.5)
Eosinophils Relative: 5 %
HCT: 38.4 % — ABNORMAL LOW (ref 39.0–52.0)
Hemoglobin: 13.2 g/dL (ref 13.0–17.0)
Immature Granulocytes: 0 %
Lymphocytes Relative: 17 %
Lymphs Abs: 1 10*3/uL (ref 0.7–4.0)
MCH: 29.9 pg (ref 26.0–34.0)
MCHC: 34.4 g/dL (ref 30.0–36.0)
MCV: 87.1 fL (ref 80.0–100.0)
Monocytes Absolute: 0.3 10*3/uL (ref 0.1–1.0)
Monocytes Relative: 6 %
Neutro Abs: 3.9 10*3/uL (ref 1.7–7.7)
Neutrophils Relative %: 71 %
Platelet Count: 263 10*3/uL (ref 150–400)
RBC: 4.41 MIL/uL (ref 4.22–5.81)
RDW: 12.4 % (ref 11.5–15.5)
WBC Count: 5.5 10*3/uL (ref 4.0–10.5)
nRBC: 0 % (ref 0.0–0.2)

## 2022-05-27 LAB — CMP (CANCER CENTER ONLY)
ALT: 99 U/L — ABNORMAL HIGH (ref 0–44)
AST: 51 U/L — ABNORMAL HIGH (ref 15–41)
Albumin: 4.2 g/dL (ref 3.5–5.0)
Alkaline Phosphatase: 198 U/L — ABNORMAL HIGH (ref 38–126)
Anion gap: 7 (ref 5–15)
BUN: 11 mg/dL (ref 6–20)
CO2: 25 mmol/L (ref 22–32)
Calcium: 9.3 mg/dL (ref 8.9–10.3)
Chloride: 108 mmol/L (ref 98–111)
Creatinine: 0.91 mg/dL (ref 0.61–1.24)
GFR, Estimated: >60 mL/min (ref >60)
Glucose, Bld: 105 mg/dL — ABNORMAL HIGH (ref 70–99)
Potassium: 3.6 mmol/L (ref 3.5–5.1)
Sodium: 140 mmol/L (ref 135–145)
Total Bilirubin: 1.1 mg/dL (ref 0.3–1.2)
Total Protein: 7.3 g/dL (ref 6.5–8.1)

## 2022-05-27 MED ORDER — HEPARIN SOD (PORK) LOCK FLUSH 100 UNIT/ML IV SOLN
500.0000 [IU] | Freq: Once | INTRAVENOUS | Status: AC
  Administered 2022-05-27: 500 [IU] via INTRAVENOUS

## 2022-05-27 MED ORDER — SODIUM CHLORIDE 0.9% FLUSH
10.0000 mL | INTRAVENOUS | Status: DC | PRN
  Administered 2022-05-27: 10 mL via INTRAVENOUS

## 2022-05-27 NOTE — Progress Notes (Signed)
[place]   [phone] [phone]) [phone]   Clinic Follow up Note   Patient Care Team: Medicine, [facility] as PCP - General Clovis Riley, MD as Consulting Physician (General Surgery) Truitt Merle, MD as Consulting Physician (Oncology)  Date of Service:  05/27/2022  CHIEF COMPLAINT: f/u of metastatic colon cancer  CURRENT THERAPY:  FOLFIRI, q14d, starting 05/28/22 -Vectibix to be added with cycle 2  ASSESSMENT & PLAN:  Matthew [name] is a 56 y.o. male with   1. Cancer of left colon, stage IIIB p(T3, N1aM0), MSS, lung and node metastasis in 04/2022 -presented to ED on 08/06/21 with recurrent upper abdominal pain. CT AP showed sigmoid colon perforation, with possible underlying malignancy. This was complicated with abdominal abscess, which required surgery and IV antibiotics.  -emergent partial colectomy on 08/08/21 by Dr. Windle Guard showed invasive moderately differentiated adenocarcinoma. Margins negative, one lymph node showed metastatic carcinoma (1/12). -Staging chest CT 08/15/21 was negative for metastatic disease. -he completed 4 cycles adjuvant CAPOX 10/10/21 - 12/25/21 (though he continued Xeloda through ~02/09/22 due to misunderstandings). -restaging PET scan on 04/08/22 showed: mild abdominal retroperitoneal lymphadenopathy is hypermetabolic; several sub-centimeter bilateral pulmonary nodules show hypermetabolism. -bronchoscopy on 05/11/22 by Dr. Lamonte Sakai confirmed metastatic colon cancer in lung -we obtained FoundationOne on the lung biopsy sample showing wild type KRAS/NRAS/BRAF. He is eligible for Vectibix. -I discussed the FO results with them. I explained that this is a biological agent and not chemotherapy.  I discussed the potential side effect of Vectibix, especially skin rash in upper body, and diarrhea.  I also discussed management of side effect.  Pt and his mother did not understand this, and his mother contacted his cousin Matthew [name]. I  discussed the indications and side effects with the three of them. Matthew [name] will look into this and explain it to her aunt. We will plan to start with cycle 2.   3. Schizophrenia, disabled, social support -he was diagnosed with schizophrenia in his 20's, managed with medication, he is on disability. -he lives with his mother; he is able to do ADLs for himself, but not iADLS. They are both on disability. His mother is also a poor historian. -he has a home care nurse that comes in.    4. Transaminitis -Probably related to Xeloda, he is asymptomatic -fluctuating but overall stable. CMP today showed AST 51, ALT 99, alk phos 198, and t.bili 1.1.     PLAN:  -proceed with first cycle FOLFIRI tomorrow, 10/26, as scheduled -lab, flush, f/u, and FOLFIRI with vectibix in 2 and 4 weeks   No problem-specific Assessment & Plan notes found for this encounter.   SUMMARY OF ONCOLOGIC HISTORY: Oncology History Overview Note   Cancer Staging  Cancer of left colon [facility]) Staging form: Colon and Rectum, AJCC 8th Edition - Pathologic stage from 08/08/2021: Stage IIIB (pT3, pN1a, cM0) - Signed by Truitt Merle, MD on 08/26/2021    Cancer of left colon ([place])  04/01/2020 Imaging   IMPRESSION: 1. Gallbladder decompressed bowel also partially calcified gallstones. No pericholecystic inflammation though if there is persisting clinical concern for cholecystitis right upper quadrant ultrasound could be obtained. 2. Circumferential thickening of the distal thoracic esophagus. Could reflect features of esophagitis. Correlate with clinical symptoms and consider endoscopy as clinically warranted. 3. Additional segmental thickening of the mid to distal sigmoid with focal narrowing. No acute surrounding inflammation or resulting obstruction. Findings are nonspecific, and could reflect sequela of prior inflammation/infection. However, recommend correlation with colonoscopy if  not recently performed. 4. Mild  circumferential bladder wall thickening and indentation of the bladder base by an enlarged prostate. Possibly sequela of chronic outlet obstruction though could correlate with urinalysis to exclude cystitis. 5. Aortic Atherosclerosis (ICD10-I70.0).   08/06/2021 Imaging   IMPRESSION: Sigmoid colonic perforation with small free intraperitoneal gas and infiltration of the mesenteric and omental fat in keeping with changes of peritonitis.   Long segment inflammatory stranding of the sigmoid colon in keeping with a severe infectious or inflammatory colitis. This terminates an area of irregular mural thickening, infiltrative soft tissue within the colonic mesentery, and focal dystrophic calcification. This may represent a chronic inflammatory process, however, a perforated malignancy could appear similarly. There are 2 separate points of perforation which again raise the question of an underlying malignancy.   7.1 cm gas and fluid containing pericolonic abscess within the sigmoid mesentery.   Marked inflammatory change of the terminal ileum adjacent to the pericolonic abscess with resultant small bowel obstruction. Fluid within the distal esophagus likely relates to gastroesophageal reflux the setting of vomiting.   Aortic Atherosclerosis (ICD10-I70.0).   08/08/2021 Cancer Staging   Staging form: Colon and Rectum, AJCC 8th Edition - Pathologic stage from 08/08/2021: Stage IIIB (pT3, pN1a, cM0) - Signed by Truitt Merle, MD on 08/26/2021 Stage prefix: Initial diagnosis Total positive nodes: 1 Histologic grading system: 4 grade system Histologic grade (G): G2 Residual tumor (R): R0 - None   08/08/2021 Definitive Surgery   FINAL MICROSCOPIC DIAGNOSIS:   A. COLON, SIGMOID, PARTIAL COLECTOMY:  - Invasive moderately differentiated adenocarcinoma.  - Metastatic carcinoma involving one of twelve lymph nodes (1/12).  - See oncology table below.   ADDENDUM:  Mismatch Repair Protein ([facility])  SUMMARY  INTERPRETATION: NORMAL    08/14/2021 Imaging   EXAM: CT ABDOMEN AND PELVIS WITH CONTRAST  IMPRESSION: 1. Post recent sigmoid colectomy with left lower quadrant colostomy. Two small residual foci of air within the pelvic mesentery with mild adjacent thickening, but no abscess or drainable collection. Trace non organized free fluid and stranding in the pelvis. 2. Short segment of small bowel wall thickening and inflammation in the pelvis involving the distal ileum, likely reactive. 3. Dilated distal esophagus, stomach, and small bowel, without discrete transition point, favoring postoperative ileus. 4. Small bilateral pleural effusions and compressive atelectasis. 5. Heterogeneous partially enhancing 14 mm lymph node in the retroperitoneum at the aortoiliac bifurcation, not significantly changed from prior exam. Suspected additional lymph nodes in the anterior common iliac space, not significantly changed from prior exam. Recommend attention at follow-up. 6. Additional chronic findings as described.     08/15/2021 Imaging   EXAM: CT CHEST WITH CONTRAST  IMPRESSION: 1. No evidence of thoracic metastasis. 2. Bilateral small layering pleural effusions with passive atelectasis   08/26/2021 Initial Diagnosis   Cancer of left colon ([place])   10/10/2021 - 12/12/2021 Chemotherapy   Patient is on Treatment Plan : COLORECTAL Xelox ([place]ox) q21d     05/28/2022 - 05/28/2022 Chemotherapy   Patient is on Treatment Plan : COLORECTAL FOLFIRI + Bevacizumab q14d     05/28/2022 -  Chemotherapy   Patient is on Treatment Plan : COLORECTAL FOLFIRI + Panitumumab q14d        INTERVAL HISTORY:  Matthew [name] is here for a follow up of metastatic colon cancer. He was last seen by me on 05/15/22. He presents to the clinic accompanied by his mother. He reports he is doing well. He notes he did well with port placement. He denies any  pain or stomach/bowel issues.   All other systems were reviewed with the  patient and are negative.  MEDICAL HISTORY:  Past Medical History:  Diagnosis Date   Cancer (Matthew [name])    Schizophrenia [facility])     SURGICAL HISTORY: Past Surgical History:  Procedure Laterality Date   BRONCHIAL BIOPSY  05/11/2022   Procedure: BRONCHIAL BIOPSIES;  Surgeon: Collene Gobble, MD;  Location: [facility] ENDOSCOPY;  Service: Pulmonary;;   BRONCHIAL BRUSHINGS  05/11/2022   Procedure: BRONCHIAL BRUSHINGS;  Surgeon: Collene Gobble, MD;  Location: [facility] ENDOSCOPY;  Service: Pulmonary;;   BRONCHIAL NEEDLE ASPIRATION BIOPSY  05/11/2022   Procedure: BRONCHIAL NEEDLE ASPIRATION BIOPSIES;  Surgeon: Collene Gobble, MD;  Location: [place];  Service: Pulmonary;;   BRONCHIAL WASHINGS  05/11/2022   Procedure: BRONCHIAL WASHINGS;  Surgeon: Collene Gobble, MD;  Location: [place];  Service: Pulmonary;;   IR IMAGING GUIDED PORT INSERTION  05/25/2022   LAPAROTOMY N/A 08/08/2021   Procedure: EXPLORATORY LAPAROTOMY;  Surgeon: Clovis Riley, MD;  Location: [place];  Service: General;  Laterality: N/A;   PARTIAL COLECTOMY N/A 08/08/2021   Procedure: PARTIAL COLECTOMY WITH COLOSTOMY;  Surgeon: Clovis Riley, MD;  Location: [place];  Service: General;  Laterality: N/A;   VIDEO BRONCHOSCOPY WITH RADIAL ENDOBRONCHIAL ULTRASOUND  05/11/2022   Procedure: VIDEO BRONCHOSCOPY WITH RADIAL ENDOBRONCHIAL ULTRASOUND;  Surgeon: Collene Gobble, MD;  Location: [facility] ENDOSCOPY;  Service: Pulmonary;;    I have reviewed the social history and family history with the patient and they are unchanged from previous note.  ALLERGIES:  has No Known Allergies.  MEDICATIONS:  Current Outpatient Medications  Medication Sig Dispense Refill   acetaminophen (TYLENOL) 500 MG tablet Take 2 tablets (1,000 mg total) by mouth every 6 (six) hours as needed for mild pain or moderate pain.  0   aspirin EC 81 MG tablet Take 81 mg by mouth daily as needed (for pain or headaches).      benztropine (COGENTIN) 1 MG tablet Take 1 mg at bedtime by  mouth.     ferrous sulfate 325 (65 FE) MG EC tablet Take 1 tablet (325 mg total) by mouth daily. (Patient not taking: Reported on 05/07/2022) 30 tablet 3   Multiple Vitamin (MULTIVITAMIN WITH MINERALS) TABS tablet Take 1 tablet by mouth daily.     paliperidone (INVEGA SUSTENNA) 156 MG/ML SUSP injection Inject 156 mg every 30 (thirty) days into the muscle.     polyethylene glycol (MIRALAX / GLYCOLAX) 17 g packet Take 17 g by mouth daily as needed for mild constipation or moderate constipation. (Patient not taking: Reported on 05/07/2022)  0   No current facility-administered medications for this visit.    PHYSICAL EXAMINATION: ECOG PERFORMANCE STATUS: 0 - Asymptomatic  Vitals:   05/27/22 0808  BP: (!) 119/91  Pulse: 88  Resp: 19  Temp: 98.1 F (36.7 C)  SpO2: 100%   Wt Readings from Last 3 Encounters:  05/27/22 150 lb 12.8 oz (68.4 kg)  05/15/22 148 lb 12.8 oz (67.5 kg)  05/11/22 144 lb (65.3 kg)     GENERAL:alert, no distress and comfortable SKIN: skin color normal, no rashes or significant lesions EYES: normal, Conjunctiva are pink and non-injected, sclera clear  NEURO: alert & oriented x 3 with fluent speech   LABORATORY DATA:  I have reviewed the data as listed    Latest Ref Rng & Units 05/27/2022    7:43 AM 05/11/2022    8:01 AM 04/08/2022  9:26 AM  CBC  WBC 4.0 - 10.5 K/uL 5.5  5.2  4.4   Hemoglobin 13.0 - 17.0 g/dL 13.2  13.8  12.6   Hematocrit 39.0 - 52.0 % 38.4  42.3  37.3   Platelets 150 - 400 K/uL 263  232  230         Latest Ref Rng & Units 05/27/2022    7:43 AM 05/11/2022    8:01 AM 04/08/2022    9:26 AM  CMP  Glucose 70 - 99 mg/dL 105  94  144   BUN 6 - 20 mg/dL 11  20  13    Creatinine 0.61 - 1.24 mg/dL 0.91  1.09  0.99   Sodium 135 - 145 mmol/L 140  141  140   Potassium 3.5 - 5.1 mmol/L 3.6  4.3  3.6   Chloride 98 - 111 mmol/L 108  107  109   CO2 22 - 32 mmol/L 25  25  23    Calcium 8.9 - 10.3 mg/dL 9.3  9.9  9.0   Total Protein 6.5 - 8.1 g/dL 7.3   7.4  7.3   Total Bilirubin 0.3 - 1.2 mg/dL 1.1  1.4  0.8   Alkaline Phos 38 - 126 U/L 198  156  182   AST 15 - 41 U/L 51  74  46   ALT 0 - 44 U/L 99  90  64       RADIOGRAPHIC STUDIES: I have personally reviewed the radiological images as listed and agreed with the findings in the report. IR IMAGING GUIDED PORT INSERTION  Result Date: 05/25/2022 INDICATION: Metastatic colon cancer. EXAM: IMPLANTED PORT A CATH PLACEMENT WITH ULTRASOUND AND FLUOROSCOPIC GUIDANCE MEDICATIONS: None; The antibiotic was administered within an appropriate time interval prior to skin puncture. ANESTHESIA/SEDATION: Moderate (conscious) sedation was employed during this procedure. A total of Versed 2 mg and Fentanyl 100 mcg was administered intravenously. Moderate Sedation Time: 24 minutes. The patient's level of consciousness and vital signs were monitored continuously by radiology nursing throughout the procedure under my direct supervision. FLUOROSCOPY TIME:  Fluoroscopic dose; 0 mGy COMPLICATIONS: None immediate. PROCEDURE: The procedure, risks, benefits, and alternatives were explained to the patient. Questions regarding the procedure were encouraged and answered. The patient understands and consents to the procedure. The RIGHT neck and chest were prepped with chlorhexidine in a sterile fashion, and a sterile drape was applied covering the operative field. Maximum barrier sterile technique with sterile gowns and gloves were used for the procedure. A timeout was performed prior to the initiation of the procedure. Local anesthesia was provided with 1% lidocaine with epinephrine. After creating a small venotomy incision, a micropuncture kit was utilized to access the internal jugular vein under direct, real-time ultrasound guidance. Ultrasound image documentation was performed. The microwire was kinked to measure appropriate catheter length. A subcutaneous port pocket was then created along the upper chest wall utilizing a  combination of sharp and blunt dissection. The pocket was irrigated with sterile saline. A single lumen ISP power injectable port was chosen for placement. The 8 Fr catheter was tunneled from the port pocket site to the venotomy incision. The port was placed in the pocket. The external catheter was trimmed to appropriate length. At the venotomy, an 8 Fr peel-away sheath was placed over a guidewire under fluoroscopic guidance. The catheter was then placed through the sheath and the sheath was removed. Final catheter positioning was confirmed and documented with a fluoroscopic spot radiograph. The port was accessed  with a Huber needle, aspirated and flushed with heparinized saline. The port pocket incision was closed with interrupted 3-0 Vicryl suture then Dermabond was applied, including at the venotomy incision. Dressings were placed. The patient tolerated the procedure well without immediate post procedural complication. IMPRESSION: Successful placement of a RIGHT internal jugular approach power injectable Port-A-Cath. The tip of the catheter is positioned within the proximal RIGHT atrium. The catheter is ready for immediate use. Michaelle Birks, MD [facility] Electronically Signed   By: Michaelle Birks M.D.   On: 05/25/2022 15:37      Orders Placed This Encounter  Procedures   CBC with Differential ([place] Only)    Standing Status:   Future    Standing Expiration Date:   05/29/2023   CMP ([place] only)    Standing Status:   Future    Standing Expiration Date:   05/29/2023   Magnesium    Standing Status:   Future    Standing Expiration Date:   05/29/2023   CBC with Differential ([facility] Only)    Standing Status:   Future    Standing Expiration Date:   06/12/2023   CMP ([place] only)    Standing Status:   Future    Standing Expiration Date:   06/12/2023   Magnesium    Standing Status:   Future    Standing Expiration  Date:   06/12/2023   CBC with Differential ([place] Only)    Standing Status:   Future    Standing Expiration Date:   06/26/2023   CMP ([place] only)    Standing Status:   Future    Standing Expiration Date:   06/26/2023   Magnesium    Standing Status:   Future    Standing Expiration Date:   06/26/2023   All questions were answered. The patient knows to call the clinic with any problems, questions or concerns. No barriers to learning was detected. The total time spent in the appointment was 40 minutes.     Truitt Merle, MD 05/27/2022   I, Wilburn Mylar, am acting as scribe for Truitt Merle, MD.   I have reviewed the above documentation for accuracy and completeness, and I agree with the above.

## 2022-05-28 ENCOUNTER — Inpatient Hospital Stay: Payer: Medicaid Other

## 2022-05-28 VITALS — BP 125/83 | HR 100 | Temp 98.2°F | Resp 20

## 2022-05-28 DIAGNOSIS — C186 Malignant neoplasm of descending colon: Secondary | ICD-10-CM

## 2022-05-28 DIAGNOSIS — Z5111 Encounter for antineoplastic chemotherapy: Secondary | ICD-10-CM | POA: Diagnosis not present

## 2022-05-28 MED ORDER — SODIUM CHLORIDE 0.9 % IV SOLN
2400.0000 mg/m2 | INTRAVENOUS | Status: DC
  Administered 2022-05-28: 4350 mg via INTRAVENOUS
  Filled 2022-05-28: qty 87

## 2022-05-28 MED ORDER — SODIUM CHLORIDE 0.9 % IV SOLN
150.0000 mg/m2 | Freq: Once | INTRAVENOUS | Status: AC
  Administered 2022-05-28: 280 mg via INTRAVENOUS
  Filled 2022-05-28: qty 4

## 2022-05-28 MED ORDER — ATROPINE SULFATE 1 MG/ML IV SOLN
0.5000 mg | Freq: Once | INTRAVENOUS | Status: AC | PRN
  Administered 2022-05-28: 0.5 mg via INTRAVENOUS
  Filled 2022-05-28: qty 1

## 2022-05-28 MED ORDER — SODIUM CHLORIDE 0.9 % IV SOLN
10.0000 mg | Freq: Once | INTRAVENOUS | Status: AC
  Administered 2022-05-28: 10 mg via INTRAVENOUS
  Filled 2022-05-28: qty 1

## 2022-05-28 MED ORDER — PALONOSETRON HCL INJECTION 0.25 MG/5ML
0.2500 mg | Freq: Once | INTRAVENOUS | Status: AC
  Administered 2022-05-28: 0.25 mg via INTRAVENOUS
  Filled 2022-05-28: qty 5

## 2022-05-28 MED ORDER — SODIUM CHLORIDE 0.9 % IV SOLN
400.0000 mg/m2 | Freq: Once | INTRAVENOUS | Status: AC
  Administered 2022-05-28: 728 mg via INTRAVENOUS
  Filled 2022-05-28: qty 25

## 2022-05-28 MED ORDER — SODIUM CHLORIDE 0.9 % IV SOLN: Freq: Once | INTRAVENOUS | Status: AC

## 2022-05-28 NOTE — Patient Instructions (Addendum)
[place]   Discharge Instructions: Thank you for choosing [facility] to provide your oncology and hematology care.   If you have a lab appointment with the [facility], please go directly to the [facility] and check in at the registration area.   Wear comfortable clothing and clothing appropriate for easy access to any Portacath or PICC line.   We strive to give you quality time with your provider. You may need to reschedule your appointment if you arrive late (15 or more minutes).  Arriving late affects you and other patients whose appointments are after yours.  Also, if you miss three or more appointments without notifying the office, you may be dismissed from the clinic at the provider's discretion.      For prescription refill requests, have your pharmacy contact our office and allow 72 hours for refills to be completed.    Today you received the following chemotherapy and/or immunotherapy agents Irinotecan (CAMPTOSAR), Leucovorin & Flourouracil (ADRUCIL).      To help prevent nausea and vomiting after your treatment, we encourage you to take your nausea medication as directed.  BELOW ARE SYMPTOMS THAT SHOULD BE REPORTED IMMEDIATELY: *FEVER GREATER THAN 100.4 F (38 C) OR HIGHER *CHILLS OR SWEATING *NAUSEA AND VOMITING THAT IS NOT CONTROLLED WITH YOUR NAUSEA MEDICATION *UNUSUAL SHORTNESS OF BREATH *UNUSUAL BRUISING OR BLEEDING *URINARY PROBLEMS (pain or burning when urinating, or frequent urination) *BOWEL PROBLEMS (unusual diarrhea, constipation, pain near the anus) TENDERNESS IN MOUTH AND THROAT WITH OR WITHOUT PRESENCE OF ULCERS (sore throat, sores in mouth, or a toothache) UNUSUAL RASH, SWELLING OR PAIN  UNUSUAL VAGINAL DISCHARGE OR ITCHING   Items with * indicate a potential emergency and should be followed up as soon as possible or go to the Emergency Department if any problems should occur.  Please show the CHEMOTHERAPY ALERT  CARD or IMMUNOTHERAPY ALERT CARD at check-in to the Emergency Department and triage nurse.  Should you have questions after your visit or need to cancel or reschedule your appointment, please contact [facility]  Dept: [phone]  and follow the prompts.  Office hours are 8:00 a.m. to 4:30 p.m. Monday - Friday. Please note that voicemails left after 4:00 p.m. may not be returned until the following business day.  We are closed weekends and major holidays. You have access to a nurse at all times for urgent questions. Please call the main number to the clinic Dept: [phone] and follow the prompts.   For any non-urgent questions, you may also contact your provider using MyChart. We now offer e-Visits for anyone 35 and older to request care online for non-urgent symptoms. For details visit [email or web address].   Also download the MyChart app! Go to the app store, search "MyChart", open the app, select [facility], and log in with your MyChart username and password.  Masks are optional in the cancer centers. If you would like for your care team to wear a mask while they are taking care of you, please let them know. You may have one support person who is at least 56 years old accompany you for your appointments.  Irinotecan Injection What is this medication? IRINOTECAN (ir in oh TEE kan) treats some types of cancer. It works by slowing down the growth of cancer cells. This medicine may be used for other purposes; ask your health care provider or pharmacist if you have questions. COMMON BRAND NAME(S): Camptosar What should I tell my  care team before I take this medication? They need to know if you have any of these conditions: Dehydration Diarrhea Infection, especially a viral infection, such as chickenpox, cold sores, herpes Liver disease Low blood cell levels (white cells, red cells, and platelets) Low levels of electrolytes, such as calcium, magnesium, or  potassium in your blood Recent or ongoing radiation An unusual or allergic reaction to irinotecan, other medications, foods, dyes, or preservatives If you or your partner are pregnant or trying to get pregnant Breast-feeding How should I use this medication? This medication is injected into a vein. It is given by your care team in a hospital or clinic setting. Talk to your care team about the use of this medication in children. Special care may be needed. Overdosage: If you think you have taken too much of this medicine contact a poison control center or emergency room at once. NOTE: This medicine is only for you. Do not share this medicine with others. What if I miss a dose? Keep appointments for follow-up doses. It is important not to miss your dose. Call your care team if you are unable to keep an appointment. What may interact with this medication? Do not take this medication with any of the following: Cobicistat Itraconazole This medication may also interact with the following: Certain antibiotics, such as clarithromycin, rifampin, rifabutin Certain antivirals for HIV or AIDS Certain medications for fungal infections, such as ketoconazole, posaconazole, voriconazole Certain medications for seizures, such as carbamazepine, phenobarbital, phenytoin Gemfibrozil Nefazodone St. John's wort This list may not describe all possible interactions. Give your health care provider a list of all the medicines, herbs, non-prescription drugs, or dietary supplements you use. Also tell them if you smoke, drink alcohol, or use illegal drugs. Some items may interact with your medicine. What should I watch for while using this medication? Your condition will be monitored carefully while you are receiving this medication. You may need blood work while taking this medication. This medication may make you feel generally unwell. This is not uncommon as chemotherapy can affect healthy cells as well as cancer  cells. Report any side effects. Continue your course of treatment even though you feel ill unless your care team tells you to stop. This medication can cause serious side effects. To reduce the risk, your care team may give you other medications to take before receiving this one. Be sure to follow the directions from your care team. This medication may affect your coordination, reaction time, or judgement. Do not drive or operate machinery until you know how this medication affects you. Sit up or stand slowly to reduce the risk of dizzy or fainting spells. Drinking alcohol with this medication can increase the risk of these side effects. This medication may increase your risk of getting an infection. Call your care team for advice if you get a fever, chills, sore throat, or other symptoms of a cold or flu. Do not treat yourself. Try to avoid being around people who are sick. Avoid taking medications that contain aspirin, acetaminophen, ibuprofen, naproxen, or ketoprofen unless instructed by your care team. These medications may hide a fever. This medication may increase your risk to bruise or bleed. Call your care team if you notice any unusual bleeding. Be careful brushing or flossing your teeth or using a toothpick because you may get an infection or bleed more easily. If you have any dental work done, tell your dentist you are receiving this medication. Talk to your care team if you  or your partner are pregnant or think either of you might be pregnant. This medication can cause serious birth defects if taken during pregnancy and for 6 months after the last dose. You will need a negative pregnancy test before starting this medication. Contraception is recommended while taking this medication and for 6 months after the last dose. Your care team can help you find the option that works for you. Do not father a child while taking this medication and for 3 months after the last dose. Use a condom for  contraception during this time period. Do not breastfeed while taking this medication and for 7 days after the last dose. This medication may cause infertility. Talk to your care team if you are concerned about your fertility. What side effects may I notice from receiving this medication? Side effects that you should report to your care team as soon as possible: Allergic reactions--skin rash, itching, hives, swelling of the face, lips, tongue, or throat Dry cough, shortness of breath or trouble breathing Increased saliva or tears, increased sweating, stomach cramping, diarrhea, small pupils, unusual weakness or fatigue, slow heartbeat Infection--fever, chills, cough, sore throat, wounds that don't heal, pain or trouble when passing urine, general feeling of discomfort or being unwell Kidney injury--decrease in the amount of urine, swelling of the ankles, hands, or feet Low red blood cell level--unusual weakness or fatigue, dizziness, headache, trouble breathing Severe or prolonged diarrhea Unusual bruising or bleeding Side effects that usually do not require medical attention (report to your care team if they continue or are bothersome): Constipation Diarrhea Hair loss Loss of appetite Nausea Stomach pain This list may not describe all possible side effects. Call your doctor for medical advice about side effects. You may report side effects to FDA at 1-800-FDA-1088. Where should I keep my medication? This medication is given in a hospital or clinic. It will not be stored at home. NOTE: This sheet is a summary. It may not cover all possible information. If you have questions about this medicine, talk to your doctor, pharmacist, or health care provider.  2023 Elsevier/Gold Standard (2021-11-27 00:00:00)  Leucovorin Injection What is this medication? LEUCOVORIN (loo koe VOR in) prevents side effects from certain medications, such as methotrexate. It works by increasing folate levels. This  helps protect healthy cells in your body. It may also be used to treat anemia caused by low levels of folate. It can also be used with fluorouracil, a type of chemotherapy, to treat colorectal cancer. It works by increasing the effects of fluorouracil in the body. This medicine may be used for other purposes; ask your health care provider or pharmacist if you have questions. What should I tell my care team before I take this medication? They need to know if you have any of these conditions: Anemia from low levels of vitamin B12 in the blood An unusual or allergic reaction to leucovorin, folic acid, other medications, foods, dyes, or preservatives Pregnant or trying to get pregnant Breastfeeding How should I use this medication? This medication is injected into a vein or a muscle. It is given by your care team in a hospital or clinic setting. Talk to your care team about the use of this medication in children. Special care may be needed. Overdosage: If you think you have taken too much of this medicine contact a poison control center or emergency room at once. NOTE: This medicine is only for you. Do not share this medicine with others. What if I miss a  dose? Keep appointments for follow-up doses. It is important not to miss your dose. Call your care team if you are unable to keep an appointment. What may interact with this medication? Capecitabine Fluorouracil Phenobarbital Phenytoin Primidone Trimethoprim;sulfamethoxazole This list may not describe all possible interactions. Give your health care provider a list of all the medicines, herbs, non-prescription drugs, or dietary supplements you use. Also tell them if you smoke, drink alcohol, or use illegal drugs. Some items may interact with your medicine. What should I watch for while using this medication? Your condition will be monitored carefully while you are receiving this medication. This medication may increase the side effects of  5-fluorouracil. Tell your care team if you have diarrhea or mouth sores that do not get better or that get worse. What side effects may I notice from receiving this medication? Side effects that you should report to your care team as soon as possible: Allergic reactions--skin rash, itching, hives, swelling of the face, lips, tongue, or throat This list may not describe all possible side effects. Call your doctor for medical advice about side effects. You may report side effects to FDA at 1-800-FDA-1088. Where should I keep my medication? This medication is given in a hospital or clinic. It will not be stored at home. NOTE: This sheet is a summary. It may not cover all possible information. If you have questions about this medicine, talk to your doctor, pharmacist, or health care provider.  2023 Elsevier/Gold Standard (2021-12-23 00:00:00)  Fluorouracil Injection What is this medication? FLUOROURACIL (flure oh YOOR a sil) treats some types of cancer. It works by slowing down the growth of cancer cells. This medicine may be used for other purposes; ask your health care provider or pharmacist if you have questions. COMMON BRAND NAME(S): Adrucil What should I tell my care team before I take this medication? They need to know if you have any of these conditions: Blood disorders Dihydropyrimidine dehydrogenase (DPD) deficiency Infection, such as chickenpox, cold sores, herpes Kidney disease Liver disease Poor nutrition Recent or ongoing radiation therapy An unusual or allergic reaction to fluorouracil, other medications, foods, dyes, or preservatives If you or your partner are pregnant or trying to get pregnant Breast-feeding How should I use this medication? This medication is injected into a vein. It is administered by your care team in a hospital or clinic setting. Talk to your care team about the use of this medication in children. Special care may be needed. Overdosage: If you think you  have taken too much of this medicine contact a poison control center or emergency room at once. NOTE: This medicine is only for you. Do not share this medicine with others. What if I miss a dose? Keep appointments for follow-up doses. It is important not to miss your dose. Call your care team if you are unable to keep an appointment. What may interact with this medication? Do not take this medication with any of the following: Live virus vaccines This medication may also interact with the following: Medications that treat or prevent blood clots, such as warfarin, enoxaparin, dalteparin This list may not describe all possible interactions. Give your health care provider a list of all the medicines, herbs, non-prescription drugs, or dietary supplements you use. Also tell them if you smoke, drink alcohol, or use illegal drugs. Some items may interact with your medicine. What should I watch for while using this medication? Your condition will be monitored carefully while you are receiving this medication. This medication  may make you feel generally unwell. This is not uncommon as chemotherapy can affect healthy cells as well as cancer cells. Report any side effects. Continue your course of treatment even though you feel ill unless your care team tells you to stop. In some cases, you may be given additional medications to help with side effects. Follow all directions for their use. This medication may increase your risk of getting an infection. Call your care team for advice if you get a fever, chills, sore throat, or other symptoms of a cold or flu. Do not treat yourself. Try to avoid being around people who are sick. This medication may increase your risk to bruise or bleed. Call your care team if you notice any unusual bleeding. Be careful brushing or flossing your teeth or using a toothpick because you may get an infection or bleed more easily. If you have any dental work done, tell your dentist you  are receiving this medication. Avoid taking medications that contain aspirin, acetaminophen, ibuprofen, naproxen, or ketoprofen unless instructed by your care team. These medications may hide a fever. Do not treat diarrhea with over the counter products. Contact your care team if you have diarrhea that lasts more than 2 days or if it is severe and watery. This medication can make you more sensitive to the sun. Keep out of the sun. If you cannot avoid being in the sun, wear protective clothing and sunscreen. Do not use sun lamps, tanning beds, or tanning booths. Talk to your care team if you or your partner wish to become pregnant or think you might be pregnant. This medication can cause serious birth defects if taken during pregnancy and for 3 months after the last dose. A reliable form of contraception is recommended while taking this medication and for 3 months after the last dose. Talk to your care team about effective forms of contraception. Do not father a child while taking this medication and for 3 months after the last dose. Use a condom while having sex during this time period. Do not breastfeed while taking this medication. This medication may cause infertility. Talk to your care team if you are concerned about your fertility. What side effects may I notice from receiving this medication? Side effects that you should report to your care team as soon as possible: Allergic reactions--skin rash, itching, hives, swelling of the face, lips, tongue, or throat Heart attack--pain or tightness in the chest, shoulders, arms, or jaw, nausea, shortness of breath, cold or clammy skin, feeling faint or lightheaded Heart failure--shortness of breath, swelling of the ankles, feet, or hands, sudden weight gain, unusual weakness or fatigue Heart rhythm changes--fast or irregular heartbeat, dizziness, feeling faint or lightheaded, chest pain, trouble breathing High ammonia level--unusual weakness or fatigue,  confusion, loss of appetite, nausea, vomiting, seizures Infection--fever, chills, cough, sore throat, wounds that don't heal, pain or trouble when passing urine, general feeling of discomfort or being unwell Low red blood cell level--unusual weakness or fatigue, dizziness, headache, trouble breathing Pain, tingling, or numbness in the hands or feet, muscle weakness, change in vision, confusion or trouble speaking, loss of balance or coordination, trouble walking, seizures Redness, swelling, and blistering of the skin over hands and feet Severe or prolonged diarrhea Unusual bruising or bleeding Side effects that usually do not require medical attention (report to your care team if they continue or are bothersome): Dry skin Headache Increased tears Nausea Pain, redness, or swelling with sores inside the mouth or throat Sensitivity to  light Vomiting This list may not describe all possible side effects. Call your doctor for medical advice about side effects. You may report side effects to FDA at 1-800-FDA-1088. Where should I keep my medication? This medication is given in a hospital or clinic. It will not be stored at home. NOTE: This sheet is a summary. It may not cover all possible information. If you have questions about this medicine, talk to your doctor, pharmacist, or health care provider.  2023 Elsevier/Gold Standard (2021-11-18 00:00:00)  The chemotherapy medication bag should finish at 46 hours, 96 hours, or 7 days. For example, if your pump is scheduled for 46 hours and it was put on at 4:00 p.m., it should finish at 2:00 p.m. the day it is scheduled to come off regardless of your appointment time.     Estimated time to finish at 11:00 a.m. on Saturday 05/30/2022.   If the display on your pump reads "Low Volume" and it is beeping, take the batteries out of the pump and come to the cancer center for it to be taken off.   If the pump alarms go off prior to the pump reading "Low  Volume" then call [phone] and someone can assist you.  If the plunger comes out and the chemotherapy medication is leaking out, please use your home chemo spill kit to clean up the spill. Do NOT use paper towels or other household products.  If you have problems or questions regarding your pump, please call either [phone] (24 hours a day) or the cancer center Monday-Friday 8:00 a.m.- 4:30 p.m. at the clinic number and we will assist you. If you are unable to get assistance, then go to the nearest Emergency Department and ask the staff to contact the IV team for assistance. \

## 2022-05-28 NOTE — Progress Notes (Signed)
Pharmacist Chemotherapy Monitoring - Initial Assessment    Anticipated start date: 05/28/22   The following has been reviewed per standard work regarding the patient's treatment regimen: The patient's diagnosis, treatment plan and drug doses, and organ/hematologic function Lab orders and baseline tests specific to treatment regimen  The treatment plan start date, drug sequencing, and pre-medications Prior authorization status  Patient's documented medication list, including drug-drug interaction screen and prescriptions for anti-emetics and supportive care specific to the treatment regimen The drug concentrations, fluid compatibility, administration routes, and timing of the medications to be used The patient's access for treatment and lifetime cumulative dose history, if applicable  The patient's medication allergies and previous infusion related reactions, if applicable   Changes made to treatment plan:  N/A  Follow up needed:  N/A   Matthew [name], [facility], 05/28/2022  9:37 AM

## 2022-05-28 NOTE — Progress Notes (Signed)
Patient received first time Folfiri today and tolerated this well.   RN could not verify if Infusystem consent was signed. [facility] Charge Nurse Dow Adolph and [place] made aware to follow up for Infusystem treatment charge today.

## 2022-05-29 ENCOUNTER — Telehealth: Payer: Self-pay | Admitting: *Deleted

## 2022-05-29 NOTE — Telephone Encounter (Signed)
-----   Message from Georgianne Fick, RN sent at 05/28/2022  2:51 PM EDT ----- Regarding: Dr. Burr Medico First Time Chemo Patient received First Time Folfiri today and tolerated this well.

## 2022-05-29 NOTE — Telephone Encounter (Signed)
Spoke with mother. States patient is sleeping but he did well with treatment. Is not nauseated, has been eating and drinking with out problems. Will come tomorrow for pump D/C.

## 2022-05-30 ENCOUNTER — Inpatient Hospital Stay: Payer: Medicaid Other

## 2022-05-30 VITALS — BP 103/72 | HR 106 | Temp 98.4°F | Resp 16 | Ht 69.0 in | Wt 150.0 lb

## 2022-05-30 DIAGNOSIS — Z5111 Encounter for antineoplastic chemotherapy: Secondary | ICD-10-CM | POA: Diagnosis not present

## 2022-05-30 DIAGNOSIS — C186 Malignant neoplasm of descending colon: Secondary | ICD-10-CM

## 2022-05-30 MED ORDER — HEPARIN SOD (PORK) LOCK FLUSH 100 UNIT/ML IV SOLN
500.0000 [IU] | Freq: Once | INTRAVENOUS | Status: AC | PRN
  Administered 2022-05-30: 500 [IU]

## 2022-05-30 MED ORDER — SODIUM CHLORIDE 0.9% FLUSH
10.0000 mL | INTRAVENOUS | Status: DC | PRN
  Administered 2022-05-30: 10 mL

## 2022-06-09 MED FILL — Dexamethasone Sodium Phosphate Inj 100 MG/10ML: INTRAMUSCULAR | Qty: 1 | Status: AC

## 2022-06-10 ENCOUNTER — Inpatient Hospital Stay: Payer: Medicaid Other

## 2022-06-10 ENCOUNTER — Encounter: Payer: Self-pay | Admitting: Hematology

## 2022-06-10 ENCOUNTER — Other Ambulatory Visit: Payer: Self-pay

## 2022-06-10 ENCOUNTER — Inpatient Hospital Stay: Payer: Medicaid Other | Attending: Hematology

## 2022-06-10 ENCOUNTER — Inpatient Hospital Stay (HOSPITAL_BASED_OUTPATIENT_CLINIC_OR_DEPARTMENT_OTHER): Payer: Medicaid Other | Admitting: Hematology

## 2022-06-10 VITALS — BP 116/77 | HR 87 | Temp 99.2°F | Resp 19 | Ht 69.0 in | Wt 152.1 lb

## 2022-06-10 DIAGNOSIS — Z5111 Encounter for antineoplastic chemotherapy: Secondary | ICD-10-CM | POA: Insufficient documentation

## 2022-06-10 DIAGNOSIS — C772 Secondary and unspecified malignant neoplasm of intra-abdominal lymph nodes: Secondary | ICD-10-CM | POA: Diagnosis not present

## 2022-06-10 DIAGNOSIS — C186 Malignant neoplasm of descending colon: Secondary | ICD-10-CM | POA: Diagnosis present

## 2022-06-10 DIAGNOSIS — C7800 Secondary malignant neoplasm of unspecified lung: Secondary | ICD-10-CM

## 2022-06-10 DIAGNOSIS — F209 Schizophrenia, unspecified: Secondary | ICD-10-CM

## 2022-06-10 DIAGNOSIS — C189 Malignant neoplasm of colon, unspecified: Secondary | ICD-10-CM

## 2022-06-10 DIAGNOSIS — Z95828 Presence of other vascular implants and grafts: Secondary | ICD-10-CM

## 2022-06-10 DIAGNOSIS — Z5112 Encounter for antineoplastic immunotherapy: Secondary | ICD-10-CM | POA: Diagnosis present

## 2022-06-10 DIAGNOSIS — Z79899 Other long term (current) drug therapy: Secondary | ICD-10-CM | POA: Diagnosis not present

## 2022-06-10 LAB — CMP (CANCER CENTER ONLY)
ALT: 68 U/L — ABNORMAL HIGH (ref 0–44)
AST: 33 U/L (ref 15–41)
Albumin: 4.1 g/dL (ref 3.5–5.0)
Alkaline Phosphatase: 181 U/L — ABNORMAL HIGH (ref 38–126)
Anion gap: 7 (ref 5–15)
BUN: 13 mg/dL (ref 6–20)
CO2: 26 mmol/L (ref 22–32)
Calcium: 9.6 mg/dL (ref 8.9–10.3)
Chloride: 105 mmol/L (ref 98–111)
Creatinine: 0.86 mg/dL (ref 0.61–1.24)
GFR, Estimated: >60 mL/min (ref >60)
Glucose, Bld: 106 mg/dL — ABNORMAL HIGH (ref 70–99)
Potassium: 3.8 mmol/L (ref 3.5–5.1)
Sodium: 138 mmol/L (ref 135–145)
Total Bilirubin: 0.6 mg/dL (ref 0.3–1.2)
Total Protein: 7.2 g/dL (ref 6.5–8.1)

## 2022-06-10 LAB — CBC WITH DIFFERENTIAL (CANCER CENTER ONLY)
Abs Immature Granulocytes: 0.01 10*3/uL (ref 0.00–0.07)
Basophils Absolute: 0 10*3/uL (ref 0.0–0.1)
Basophils Relative: 1 %
Eosinophils Absolute: 0.1 10*3/uL (ref 0.0–0.5)
Eosinophils Relative: 3 %
HCT: 35 % — ABNORMAL LOW (ref 39.0–52.0)
Hemoglobin: 12.2 g/dL — ABNORMAL LOW (ref 13.0–17.0)
Immature Granulocytes: 0 %
Lymphocytes Relative: 23 %
Lymphs Abs: 1 10*3/uL (ref 0.7–4.0)
MCH: 29.8 pg (ref 26.0–34.0)
MCHC: 34.9 g/dL (ref 30.0–36.0)
MCV: 85.4 fL (ref 80.0–100.0)
Monocytes Absolute: 0.3 10*3/uL (ref 0.1–1.0)
Monocytes Relative: 6 %
Neutro Abs: 3.1 10*3/uL (ref 1.7–7.7)
Neutrophils Relative %: 67 %
Platelet Count: 298 10*3/uL (ref 150–400)
RBC: 4.1 MIL/uL — ABNORMAL LOW (ref 4.22–5.81)
RDW: 12.1 % (ref 11.5–15.5)
WBC Count: 4.6 10*3/uL (ref 4.0–10.5)
nRBC: 0 % (ref 0.0–0.2)

## 2022-06-10 LAB — MAGNESIUM: Magnesium: 2 mg/dL (ref 1.7–2.4)

## 2022-06-10 MED ORDER — ATROPINE SULFATE 1 MG/ML IV SOLN
0.5000 mg | Freq: Once | INTRAVENOUS | Status: AC | PRN
  Administered 2022-06-10: 0.5 mg via INTRAVENOUS
  Filled 2022-06-10: qty 1

## 2022-06-10 MED ORDER — CLINDAMYCIN PHOSPHATE 1 % EX GEL: Freq: Two times a day (BID) | CUTANEOUS | 0 refills | Status: AC

## 2022-06-10 MED ORDER — PALONOSETRON HCL INJECTION 0.25 MG/5ML
0.2500 mg | Freq: Once | INTRAVENOUS | Status: AC
  Administered 2022-06-10: 0.25 mg via INTRAVENOUS
  Filled 2022-06-10: qty 5

## 2022-06-10 MED ORDER — SODIUM CHLORIDE 0.9 % IV SOLN
400.0000 mg/m2 | Freq: Once | INTRAVENOUS | Status: AC
  Administered 2022-06-10: 728 mg via INTRAVENOUS
  Filled 2022-06-10: qty 25

## 2022-06-10 MED ORDER — SODIUM CHLORIDE 0.9 % IV SOLN: Freq: Once | INTRAVENOUS | Status: AC

## 2022-06-10 MED ORDER — SODIUM CHLORIDE 0.9% FLUSH
10.0000 mL | INTRAVENOUS | Status: AC | PRN
  Administered 2022-06-10: 10 mL

## 2022-06-10 MED ORDER — SODIUM CHLORIDE 0.9 % IV SOLN
180.0000 mg/m2 | Freq: Once | INTRAVENOUS | Status: AC
  Administered 2022-06-10: 320 mg via INTRAVENOUS
  Filled 2022-06-10: qty 15

## 2022-06-10 MED ORDER — SODIUM CHLORIDE 0.9 % IV SOLN
2400.0000 mg/m2 | INTRAVENOUS | Status: DC
  Administered 2022-06-10: 4350 mg via INTRAVENOUS
  Filled 2022-06-10: qty 87

## 2022-06-10 MED ORDER — SODIUM CHLORIDE 0.9 % IV SOLN
10.0000 mg | Freq: Once | INTRAVENOUS | Status: AC
  Administered 2022-06-10: 10 mg via INTRAVENOUS
  Filled 2022-06-10: qty 10

## 2022-06-10 MED ORDER — SODIUM CHLORIDE 0.9 % IV SOLN
6.0000 mg/kg | Freq: Once | INTRAVENOUS | Status: AC
  Administered 2022-06-10: 400 mg via INTRAVENOUS
  Filled 2022-06-10: qty 20

## 2022-06-10 NOTE — Patient Instructions (Signed)
[facility]  Discharge Instructions: Thank you for choosing [facility] to provide your oncology and hematology care.   If you have a lab appointment with the [facility], please go directly to the [facility] and check in at the registration area.   Wear comfortable clothing and clothing appropriate for easy access to any Portacath or PICC line.   We strive to give you quality time with your provider. You may need to reschedule your appointment if you arrive late (15 or more minutes).  Arriving late affects you and other patients whose appointments are after yours.  Also, if you miss three or more appointments without notifying the office, you may be dismissed from the clinic at the provider's discretion.      For prescription refill requests, have your pharmacy contact our office and allow 72 hours for refills to be completed.    Today you received the following chemotherapy and/or immunotherapy agents: Vectibix, irinotecan, and fluorouracil       To help prevent nausea and vomiting after your treatment, we encourage you to take your nausea medication as directed.  BELOW ARE SYMPTOMS THAT SHOULD BE REPORTED IMMEDIATELY: *FEVER GREATER THAN 100.4 F (38 C) OR HIGHER *CHILLS OR SWEATING *NAUSEA AND VOMITING THAT IS NOT CONTROLLED WITH YOUR NAUSEA MEDICATION *UNUSUAL SHORTNESS OF BREATH *UNUSUAL BRUISING OR BLEEDING *URINARY PROBLEMS (pain or burning when urinating, or frequent urination) *BOWEL PROBLEMS (unusual diarrhea, constipation, pain near the anus) TENDERNESS IN MOUTH AND THROAT WITH OR WITHOUT PRESENCE OF ULCERS (sore throat, sores in mouth, or a toothache) UNUSUAL RASH, SWELLING OR PAIN  UNUSUAL VAGINAL DISCHARGE OR ITCHING   Items with * indicate a potential emergency and should be followed up as soon as possible or go to the Emergency Department if any problems should occur.  Please show the CHEMOTHERAPY ALERT CARD or  IMMUNOTHERAPY ALERT CARD at check-in to the Emergency Department and triage nurse.  Should you have questions after your visit or need to cancel or reschedule your appointment, please contact [facility]  Dept: [phone]  and follow the prompts.  Office hours are 8:00 a.m. to 4:30 p.m. Monday - Friday. Please note that voicemails left after 4:00 p.m. may not be returned until the following business day.  We are closed weekends and major holidays. You have access to a nurse at all times for urgent questions. Please call the main number to the clinic Dept: [phone] and follow the prompts.   For any non-urgent questions, you may also contact your provider using MyChart. We now offer e-Visits for anyone 42 and older to request care online for non-urgent symptoms. For details visit [email or web address].   Also download the MyChart app! Go to the app store, search "MyChart", open the app, select [facility], and log in with your MyChart username and password.  Masks are optional in the cancer centers. If you would like for your care team to wear a mask while they are taking care of you, please let them know. You may have one support person who is at least 56 years old accompany you for your appointments.  Panitumumab Injection What is this medication? PANITUMUMAB (pan i TOOM ue mab) treats colorectal cancer. It works by blocking a protein that causes cancer cells to grow and multiply. This helps to slow or stop the spread of cancer cells. It is a monoclonal antibody. This medicine may be used for other purposes; ask your health care  provider or pharmacist if you have questions. COMMON BRAND NAME(S): Vectibix What should I tell my care team before I take this medication? They need to know if you have any of these conditions: Eye disease Low levels of magnesium in the blood Lung disease An unusual or allergic reaction to panitumumab, other medications, foods,  dyes, or preservatives Pregnant or trying to get pregnant Breast-feeding How should I use this medication? This medication is injected into a vein. It is given by your care team in a hospital or clinic setting. Talk to your care team about the use of this medication in children. Special care may be needed. Overdosage: If you think you have taken too much of this medicine contact a poison control center or emergency room at once. NOTE: This medicine is only for you. Do not share this medicine with others. What if I miss a dose? Keep appointments for follow-up doses. It is important not to miss your dose. Call your care team if you are unable to keep an appointment. What may interact with this medication? Bevacizumab This list may not describe all possible interactions. Give your health care provider a list of all the medicines, herbs, non-prescription drugs, or dietary supplements you use. Also tell them if you smoke, drink alcohol, or use illegal drugs. Some items may interact with your medicine. What should I watch for while using this medication? Your condition will be monitored carefully while you are receiving this medication. This medication may make you feel generally unwell. This is not uncommon as chemotherapy can affect healthy cells as well as cancer cells. Report any side effects. Continue your course of treatment even though you feel ill unless your care team tells you to stop. This medication can make you more sensitive to the sun. Keep out of the sun while receiving this medication and for 2 months after stopping therapy. If you cannot avoid being in the sun, wear protective clothing and sunscreen. Do not use sun lamps, tanning beds, or tanning booths. Check with your care team if you have severe diarrhea, nausea, and vomiting or if you sweat a lot. The loss of too much body fluid may make it dangerous for you to take this medication. This medication may cause serious skin reactions.  They can happen weeks to months after starting the medication. Contact your care team right away if you notice fevers or flu-like symptoms with a rash. The rash may be red or purple and then turn into blisters or peeling of the skin. You may also notice a red rash with swelling of the face, lips, or lymph nodes in your neck or under your arms. Talk to your care team if you may be pregnant. Serious birth defects can occur if you take this medication during pregnancy and for 2 months after the last dose. Contraception is recommended while taking this medication and for 2 months after the last dose. Your care team can help you find the option that works for you. Do not breastfeed while taking this medication and for 2 months after the last dose. This medication may cause infertility. Talk to your care team if you are concerned about your fertility. What side effects may I notice from receiving this medication? Side effects that you should report to your care team as soon as possible: Allergic reactions--skin rash, itching, hives, swelling of the face, lips, tongue, or throat Dry cough, shortness of breath or trouble breathing Eye pain, redness, irritation, or discharge with blurry or decreased  vision Infusion reactions--chest pain, shortness of breath or trouble breathing, feeling faint or lightheaded Low magnesium level--muscle pain or cramps, unusual weakness or fatigue, fast or irregular heartbeat, tremors Low potassium level--muscle pain or cramps, unusual weakness or fatigue, fast or irregular heartbeat, constipation Redness, blistering, peeling, or loosening of the skin, including inside the mouth Skin reactions on sun-exposed areas Side effects that usually do not require medical attention (report to your care team if they continue or are bothersome): Change in nail shape, thickness, or color Diarrhea Dry skin Fatigue Nausea Vomiting This list may not describe all possible side effects. Call  your doctor for medical advice about side effects. You may report side effects to FDA at 1-800-FDA-1088. Where should I keep my medication? This medication is given in a hospital or clinic. It will not be stored at home. NOTE: This sheet is a summary. It may not cover all possible information. If you have questions about this medicine, talk to your doctor, pharmacist, or health care provider.  2023 Elsevier/Gold Standard (2021-12-03 00:00:00)

## 2022-06-10 NOTE — Progress Notes (Signed)
Received message from provider regarding Matthew [name] now that patient is in chemo.  Called patient's mother Matthew [name] to advise what is needed to apply for grant. She will bring on 11/10 at next visit and complete grant paperwork. Advised her to contact me at earliest convenience after appointment to discuss grant expenses in detail. She will have my card to do so and for any additional financial questions or concerns.

## 2022-06-10 NOTE — Progress Notes (Signed)
[name]   [phone] [phone]   Clinic Follow up Note   Patient Care Team: Medicine, [facility] as PCP - General Clovis Riley, MD as Consulting Physician (General Surgery) Truitt Merle, MD as Consulting Physician (Oncology)  Date of Service:  06/10/2022  CHIEF COMPLAINT: f/u of metastatic colon cancer  CURRENT THERAPY:  FOLFIRI, q14d, starting 05/28/22 -Vectibix to be added with cycle 2  ASSESSMENT & PLAN:  Matthew [name] is a 56 y.o. male with   1. Cancer of left colon, stage IIIB p(T3, N1aM0), MSS, lung and node metastasis in 04/2022 -presented to ED on 08/06/21 with recurrent upper abdominal pain. CT AP showed sigmoid colon perforation, with possible underlying malignancy. This was complicated with abdominal abscess, which required surgery and IV antibiotics.  -emergent partial colectomy on 08/08/21 by Dr. Windle Guard showed invasive moderately differentiated adenocarcinoma. Margins negative, one lymph node showed metastatic carcinoma (1/12). -Staging chest CT 08/15/21 was negative for metastatic disease. -he completed 4 cycles adjuvant CAPOX 10/10/21 - 12/25/21 (though he somehow was still taking Xeloda through ~02/09/22 due to misunderstandings). -restaging PET scan on 04/08/22 showed hypermetabolism to mild abdominal retroperitoneal lymphadenopathy and several sub-centimeter bilateral pulmonary nodules. -bronchoscopy on 05/11/22 by Dr. Lamonte Sakai confirmed metastatic colon cancer in lung -we obtained FoundationOne on the lung biopsy sample showing wild type KRAS/NRAS/BRAF. He is eligible for Vectibix. -he began FOLFIRI on 05/28/22. He tolerated very well with only one episode of diarrhea. -labs reviewed, hgb dropped slightly, 12.2 today. Adequate to proceed with cycle 2 today at full dose. We are adding vectibix today; I briefly reviewed AE's and called in clindamycin gel for him to use as needed for acne rash.   2. Schizophrenia, disabled,  social support -he was diagnosed with schizophrenia in his 20's, managed with medication, he is on disability. -he lives with his mother; he is able to do ADLs for himself, but not iADLS. They are both on disability. His mother also seems to have some kind of mental impairment.   3. Transaminitis -Probably related to Xeloda, he is asymptomatic -fluctuating but overall stable.      PLAN:  -proceed with C2 FOLFIRI at full dose and vectibix -I called in clindamycin gel, and encouraged him to use hydrocortisone as needed for skin rash. -lab, flush, f/u, and FOLFIRI with vectibix in 2 and 4 weeks   No problem-specific Assessment & Plan notes found for this encounter.   SUMMARY OF ONCOLOGIC HISTORY: Oncology History Overview Note   Cancer Staging  Cancer of left colon [facility]) Staging form: Colon and Rectum, AJCC 8th Edition - Pathologic stage from 08/08/2021: Stage IIIB (pT3, pN1a, cM0) - Signed by Truitt Merle, MD on 08/26/2021    Cancer of left colon ([place])  04/01/2020 Imaging   IMPRESSION: 1. Gallbladder decompressed bowel also partially calcified gallstones. No pericholecystic inflammation though if there is persisting clinical concern for cholecystitis right upper quadrant ultrasound could be obtained. 2. Circumferential thickening of the distal thoracic esophagus. Could reflect features of esophagitis. Correlate with clinical symptoms and consider endoscopy as clinically warranted. 3. Additional segmental thickening of the mid to distal sigmoid with focal narrowing. No acute surrounding inflammation or resulting obstruction. Findings are nonspecific, and could reflect sequela of prior inflammation/infection. However, recommend correlation with colonoscopy if not recently performed. 4. Mild circumferential bladder wall thickening and indentation of the bladder base by an enlarged prostate. Possibly sequela of chronic outlet obstruction though could correlate with urinalysis  to exclude cystitis. 5.  Aortic Atherosclerosis (ICD10-I70.0).   08/06/2021 Imaging   IMPRESSION: Sigmoid colonic perforation with small free intraperitoneal gas and infiltration of the mesenteric and omental fat in keeping with changes of peritonitis.   Long segment inflammatory stranding of the sigmoid colon in keeping with a severe infectious or inflammatory colitis. This terminates an area of irregular mural thickening, infiltrative soft tissue within the colonic mesentery, and focal dystrophic calcification. This may represent a chronic inflammatory process, however, a perforated malignancy could appear similarly. There are 2 separate points of perforation which again raise the question of an underlying malignancy.   7.1 cm gas and fluid containing pericolonic abscess within the sigmoid mesentery.   Marked inflammatory change of the terminal ileum adjacent to the pericolonic abscess with resultant small bowel obstruction. Fluid within the distal esophagus likely relates to gastroesophageal reflux the setting of vomiting.   Aortic Atherosclerosis (ICD10-I70.0).   08/08/2021 Cancer Staging   Staging form: Colon and Rectum, AJCC 8th Edition - Pathologic stage from 08/08/2021: Stage IIIB (pT3, pN1a, cM0) - Signed by Truitt Merle, MD on 08/26/2021 Stage prefix: Initial diagnosis Total positive nodes: 1 Histologic grading system: 4 grade system Histologic grade (G): G2 Residual tumor (R): R0 - None   08/08/2021 Definitive Surgery   FINAL MICROSCOPIC DIAGNOSIS:   A. COLON, SIGMOID, PARTIAL COLECTOMY:  - Invasive moderately differentiated adenocarcinoma.  - Metastatic carcinoma involving one of twelve lymph nodes (1/12).  - See oncology table below.   ADDENDUM:  Mismatch Repair Protein (IHC)  SUMMARY INTERPRETATION: NORMAL    08/14/2021 Imaging   EXAM: CT ABDOMEN AND PELVIS WITH CONTRAST  IMPRESSION: 1. Post recent sigmoid colectomy with left lower quadrant colostomy. Two small  residual foci of air within the pelvic mesentery with mild adjacent thickening, but no abscess or drainable collection. Trace non organized free fluid and stranding in the pelvis. 2. Short segment of small bowel wall thickening and inflammation in the pelvis involving the distal ileum, likely reactive. 3. Dilated distal esophagus, stomach, and small bowel, without discrete transition point, favoring postoperative ileus. 4. Small bilateral pleural effusions and compressive atelectasis. 5. Heterogeneous partially enhancing 14 mm lymph node in the retroperitoneum at the aortoiliac bifurcation, not significantly changed from prior exam. Suspected additional lymph nodes in the anterior common iliac space, not significantly changed from prior exam. Recommend attention at follow-up. 6. Additional chronic findings as described.     08/15/2021 Imaging   EXAM: CT CHEST WITH CONTRAST  IMPRESSION: 1. No evidence of thoracic metastasis. 2. Bilateral small layering pleural effusions with passive atelectasis   08/26/2021 Initial Diagnosis   Cancer of left colon ([place])   10/10/2021 - 12/12/2021 Chemotherapy   Patient is on Treatment Plan : COLORECTAL Xelox ([place]ox) q21d     05/28/2022 - 05/28/2022 Chemotherapy   Patient is on Treatment Plan : COLORECTAL FOLFIRI + Bevacizumab q14d     05/28/2022 -  Chemotherapy   Patient is on Treatment Plan : COLORECTAL FOLFIRI + Panitumumab q14d        INTERVAL HISTORY:  Matthew [name] is here for a follow up of metastatic colon cancer. He was last seen by me on 05/27/22. He presents to the clinic accompanied by his mother. He reports he is doing well today, denies any side effects from chemo. His mother reports he had a slight cough and one episode of diarrhea.   All other systems were reviewed with the patient and are negative.  MEDICAL HISTORY:  Past Medical History:  Diagnosis Date   Cancer (  [place])    Schizophrenia ([place])     SURGICAL HISTORY: Past  Surgical History:  Procedure Laterality Date   BRONCHIAL BIOPSY  05/11/2022   Procedure: BRONCHIAL BIOPSIES;  Surgeon: Collene Gobble, MD;  Location: [facility] ENDOSCOPY;  Service: Pulmonary;;   BRONCHIAL BRUSHINGS  05/11/2022   Procedure: BRONCHIAL BRUSHINGS;  Surgeon: Collene Gobble, MD;  Location: [facility] ENDOSCOPY;  Service: Pulmonary;;   BRONCHIAL NEEDLE ASPIRATION BIOPSY  05/11/2022   Procedure: BRONCHIAL NEEDLE ASPIRATION BIOPSIES;  Surgeon: Collene Gobble, MD;  Location: [facility];  Service: Pulmonary;;   BRONCHIAL WASHINGS  05/11/2022   Procedure: BRONCHIAL WASHINGS;  Surgeon: Collene Gobble, MD;  Location: [facility];  Service: Pulmonary;;   IR IMAGING GUIDED PORT INSERTION  05/25/2022   LAPAROTOMY N/A 08/08/2021   Procedure: EXPLORATORY LAPAROTOMY;  Surgeon: Clovis Riley, MD;  Location: [facility];  Service: General;  Laterality: N/A;   PARTIAL COLECTOMY N/A 08/08/2021   Procedure: PARTIAL COLECTOMY WITH COLOSTOMY;  Surgeon: Clovis Riley, MD;  Location: [facility];  Service: General;  Laterality: N/A;   VIDEO BRONCHOSCOPY WITH RADIAL ENDOBRONCHIAL ULTRASOUND  05/11/2022   Procedure: VIDEO BRONCHOSCOPY WITH RADIAL ENDOBRONCHIAL ULTRASOUND;  Surgeon: Collene Gobble, MD;  Location: [facility] ENDOSCOPY;  Service: Pulmonary;;    I have reviewed the social history and family history with the patient and they are unchanged from previous note.  ALLERGIES:  has No Known Allergies.  MEDICATIONS:  Current Outpatient Medications  Medication Sig Dispense Refill   clindamycin (CLINDAGEL) 1 % gel Apply topically 2 (two) times daily. To skin rash on face and upper body 30 g 0   acetaminophen (TYLENOL) 500 MG tablet Take 2 tablets (1,000 mg total) by mouth every 6 (six) hours as needed for mild pain or moderate pain.  0   aspirin EC 81 MG tablet Take 81 mg by mouth daily as needed (for pain or headaches).      benztropine (COGENTIN) 1 MG tablet Take 1 mg at bedtime by mouth.     ferrous sulfate 325 (65 FE) MG  EC tablet Take 1 tablet (325 mg total) by mouth daily. (Patient not taking: Reported on 05/07/2022) 30 tablet 3   Multiple Vitamin (MULTIVITAMIN WITH MINERALS) TABS tablet Take 1 tablet by mouth daily.     paliperidone (INVEGA SUSTENNA) 156 MG/ML SUSP injection Inject 156 mg every 30 (thirty) days into the muscle.     polyethylene glycol (MIRALAX / GLYCOLAX) 17 g packet Take 17 g by mouth daily as needed for mild constipation or moderate constipation. (Patient not taking: Reported on 05/07/2022)  0   No current facility-administered medications for this visit.    PHYSICAL EXAMINATION: ECOG PERFORMANCE STATUS: 0  Vitals:   06/10/22 1058  BP: 116/77  Pulse: 87  Resp: 19  Temp: 99.2 F (37.3 C)  SpO2: 100%   Wt Readings from Last 3 Encounters:  06/10/22 152 lb 1.6 oz (69 kg)  05/30/22 150 lb (68 kg)  05/27/22 150 lb 12.8 oz (68.4 kg)     GENERAL:alert, no distress and comfortable SKIN: skin color normal, no rashes or significant lesions EYES: normal, Conjunctiva are pink and non-injected, sclera clear  NEURO: alert & oriented x 3 with fluent speech  LABORATORY DATA:  I have reviewed the data as listed    Latest Ref Rng & Units 06/10/2022   10:43 AM 05/27/2022    7:43 AM 05/11/2022    8:01 AM  CBC  WBC 4.0 - 10.5 K/uL 4.6  5.5  5.2   Hemoglobin 13.0 - 17.0 g/dL 12.2  13.2  13.8   Hematocrit 39.0 - 52.0 % 35.0  38.4  42.3   Platelets 150 - 400 K/uL 298  263  232         Latest Ref Rng & Units 05/27/2022    7:43 AM 05/11/2022    8:01 AM 04/08/2022    9:26 AM  CMP  Glucose 70 - 99 mg/dL 105  94  144   BUN 6 - 20 mg/dL _0 Creatinine 0.61 - 1.24 mg/dL 0.91  1.09  0.99   Sodium 135 - 145 mmol/L 140  141  140   Potassium 3.5 - 5.1 mmol/L 3.6  4.3  3.6   Chloride 98 - 111 mmol/L 108  107  109   CO2 22 - 32 mmol/L _1 Calcium 8.9 - 10.3 mg/dL 9.3  9.9  9.0   Total Protein 6.5 - 8.1 g/dL 7.3  7.4  7.3   Total Bilirubin 0.3 - 1.2 mg/dL 1.1  1.4  0.8    Alkaline Phos 38 - 126 U/L 198  156  182   AST 15 - 41 U/L 51  74  46   ALT 0 - 44 U/L 99  90  64       RADIOGRAPHIC STUDIES: I have personally reviewed the radiological images as listed and agreed with the findings in the report. No results found.    Orders Placed This Encounter  Procedures   CBC with Differential ([facility] Only)    Standing Status:   Future    Standing Expiration Date:   07/10/2023   CMP ([place] [facility] only)    Standing Status:   Future    Standing Expiration Date:   07/10/2023   Magnesium    Standing Status:   Future    Standing Expiration Date:   07/10/2023   CBC with Differential (Cancer Center Only)    Standing Status:   Future    Standing Expiration Date:   07/24/2023   CMP ([place] only)    Standing Status:   Future    Standing Expiration Date:   07/24/2023   Magnesium    Standing Status:   Future    Standing Expiration Date:   07/24/2023   All questions were answered. The patient knows to call the clinic with any problems, questions or concerns. No barriers to learning was detected. The total time spent in the appointment was 30 minutes.     Truitt Merle, MD 06/10/2022   I, Wilburn Mylar, am acting as scribe for Truitt Merle, MD.   I have reviewed the above documentation for accuracy and completeness, and I agree with the above.

## 2022-06-11 ENCOUNTER — Telehealth: Payer: Self-pay

## 2022-06-11 ENCOUNTER — Other Ambulatory Visit: Payer: Self-pay

## 2022-06-11 NOTE — Telephone Encounter (Signed)
-----   Message from Charleston Poot, RN sent at 06/10/2022  4:15 PM EST ----- Regarding: first time/ vectibix/ Dr Burr Medico pt Hey there,   Pt had first time vectibix today, tolerated treatment well.   Thanks, Libbie K.

## 2022-06-11 NOTE — Telephone Encounter (Signed)
Spoke with Mr. Hick's Mother [place]. She that that Deantre is doing fine. He is eating, drinking, and urinating well.  She has the office number [phone] if she has any questions or concerns.

## 2022-06-12 ENCOUNTER — Inpatient Hospital Stay: Payer: Medicaid Other

## 2022-06-12 ENCOUNTER — Other Ambulatory Visit: Payer: Self-pay

## 2022-06-12 VITALS — BP 103/67 | HR 111 | Temp 98.1°F | Resp 18

## 2022-06-12 DIAGNOSIS — Z5111 Encounter for antineoplastic chemotherapy: Secondary | ICD-10-CM | POA: Diagnosis not present

## 2022-06-12 DIAGNOSIS — C186 Malignant neoplasm of descending colon: Secondary | ICD-10-CM

## 2022-06-12 MED ORDER — SODIUM CHLORIDE 0.9% FLUSH
10.0000 mL | INTRAVENOUS | Status: DC | PRN
  Administered 2022-06-12: 10 mL

## 2022-06-12 MED ORDER — HEPARIN SOD (PORK) LOCK FLUSH 100 UNIT/ML IV SOLN
500.0000 [IU] | Freq: Once | INTRAVENOUS | Status: AC | PRN
  Administered 2022-06-12: 500 [IU]

## 2022-06-15 ENCOUNTER — Other Ambulatory Visit: Payer: Medicaid Other

## 2022-06-17 ENCOUNTER — Encounter (HOSPITAL_COMMUNITY): Payer: Self-pay | Admitting: Nurse Practitioner

## 2022-06-19 ENCOUNTER — Encounter: Payer: Self-pay | Admitting: Hematology

## 2022-06-19 ENCOUNTER — Ambulatory Visit: Payer: Medicaid Other | Admitting: Hematology

## 2022-06-19 NOTE — Progress Notes (Signed)
Patient's mother called to confirm what to bring to apply for [employer]. Also she didn't think he had an appointment on 11/22. Advised her he does have appointments on that day and time. She repeated back to me and states she wrote them down. She also states she will bring the black bag she was given.  She has my card for any additional financial questions or concerns.

## 2022-06-19 NOTE — Progress Notes (Signed)
[facility] OFFICE PROGRESS NOTE  Medicine, [facility] [address] [place] [address]  DIAGNOSIS: f/u of metastatic colon cancer   Oncology History Overview Note   Cancer Staging  Cancer of left colon [facility]) Staging form: Colon and Rectum, AJCC 8th Edition - Pathologic stage from 08/08/2021: Stage IIIB (pT3, pN1a, cM0) - Signed by Malachy Mood, MD on 08/26/2021    Cancer of left colon (HCC)  04/01/2020 Imaging   IMPRESSION: 1. Gallbladder decompressed bowel also partially calcified gallstones. No pericholecystic inflammation though if there is persisting clinical concern for cholecystitis right upper quadrant ultrasound could be obtained. 2. Circumferential thickening of the distal thoracic esophagus. Could reflect features of esophagitis. Correlate with clinical symptoms and consider endoscopy as clinically warranted. 3. Additional segmental thickening of the mid to distal sigmoid with focal narrowing. No acute surrounding inflammation or resulting obstruction. Findings are nonspecific, and could reflect sequela of prior inflammation/infection. However, recommend correlation with colonoscopy if not recently performed. 4. Mild circumferential bladder wall thickening and indentation of the bladder base by an enlarged prostate. Possibly sequela of chronic outlet obstruction though could correlate with urinalysis to exclude cystitis. 5. Aortic Atherosclerosis (ICD10-I70.0).   08/06/2021 Imaging   IMPRESSION: Sigmoid colonic perforation with small free intraperitoneal gas and infiltration of the mesenteric and omental fat in keeping with changes of peritonitis.   Long segment inflammatory stranding of the sigmoid colon in keeping with a severe infectious or inflammatory colitis. This terminates an area of irregular mural thickening, infiltrative soft tissue within the colonic mesentery, and focal dystrophic calcification. This may represent a chronic  inflammatory process, however, a perforated malignancy could appear similarly. There are 2 separate points of perforation which again raise the question of an underlying malignancy.   7.1 cm gas and fluid containing pericolonic abscess within the sigmoid mesentery.   Marked inflammatory change of the terminal ileum adjacent to the pericolonic abscess with resultant small bowel obstruction. Fluid within the distal esophagus likely relates to gastroesophageal reflux the setting of vomiting.   Aortic Atherosclerosis (ICD10-I70.0).   08/08/2021 Cancer Staging   Staging form: Colon and Rectum, AJCC 8th Edition - Pathologic stage from 08/08/2021: Stage IIIB (pT3, pN1a, cM0) - Signed by Malachy Mood, MD on 08/26/2021 Stage prefix: Initial diagnosis Total positive nodes: 1 Histologic grading system: 4 grade system Histologic grade (G): G2 Residual tumor (R): R0 - None   08/08/2021 Definitive Surgery   FINAL MICROSCOPIC DIAGNOSIS:   A. COLON, SIGMOID, PARTIAL COLECTOMY:  - Invasive moderately differentiated adenocarcinoma.  - Metastatic carcinoma involving one of twelve lymph nodes (1/12).  - See oncology table below.   ADDENDUM:  Mismatch Repair Protein (IHC)  SUMMARY INTERPRETATION: NORMAL    08/14/2021 Imaging   EXAM: CT ABDOMEN AND PELVIS WITH CONTRAST  IMPRESSION: 1. Post recent sigmoid colectomy with left lower quadrant colostomy. Two small residual foci of air within the pelvic mesentery with mild adjacent thickening, but no abscess or drainable collection. Trace non organized free fluid and stranding in the pelvis. 2. Short segment of small bowel wall thickening and inflammation in the pelvis involving the distal ileum, likely reactive. 3. Dilated distal esophagus, stomach, and small bowel, without discrete transition point, favoring postoperative ileus. 4. Small bilateral pleural effusions and compressive atelectasis. 5. Heterogeneous partially enhancing 14 mm lymph node in  the retroperitoneum at the aortoiliac bifurcation, not significantly changed from prior exam. Suspected additional lymph nodes in the anterior common iliac space, not significantly changed from prior  exam. Recommend attention at follow-up. 6. Additional chronic findings as described.     08/15/2021 Imaging   EXAM: CT CHEST WITH CONTRAST  IMPRESSION: 1. No evidence of thoracic metastasis. 2. Bilateral small layering pleural effusions with passive atelectasis   08/26/2021 Initial Diagnosis   Cancer of left colon (HCC)   10/10/2021 - 12/12/2021 Chemotherapy   Patient is on Treatment Plan : COLORECTAL Xelox (Capeox) q21d     05/28/2022 - 05/28/2022 Chemotherapy   Patient is on Treatment Plan : COLORECTAL FOLFIRI + Bevacizumab q14d     05/28/2022 -  Chemotherapy   Patient is on Treatment Plan : COLORECTAL FOLFIRI + Panitumumab q14d        CURRENT THERAPY: FOLFIRI, q14d, starting 05/28/22 -Vectibix to be added with cycle 2  INTERVAL HISTORY: Matthew [name] 56 y.o. male returns to the clinic today for a follow-up visit for his metastatic colon cancer. He is accompanied by his mother today.  He was last seen by Dr. Mosetta Putt on 06/10/2022.  The patient is currently undergoing treatment with FOLFIRI and Vectibix.  Vectibix was added from his last cycle of treatment and he tolerated it well without any side effects except for mild rash on his nose/forehead. Dr. Mosetta Putt prescribed  hydrocortisone cream and clindamycin if needed for the rash; however, they have not picked it up yet. They will need another refill of the hydrocortisone cream.  The patient denies any fever or chills. Per chart review, it appears he lost several pounds. The patient denies appetite change. He does not drink supplemental protein drinks. Denies any chest pain, shortness of breath, cough, or hemoptysis.  Denies any abdominal pain.  He denies blood in the stool. He denies any nausea, vomiting, or constipation.  He had two episodes  of diarrhea since last being seen. He is here today for evaluation and repeat blood work before undergoing cycle #3.   MEDICAL HISTORY: Past Medical History:  Diagnosis Date   Cancer (HCC)    Schizophrenia (HCC)     ALLERGIES:  has No Known Allergies.  MEDICATIONS:  Current Outpatient Medications  Medication Sig Dispense Refill   hydrocortisone cream 1 % Apply 1 Application topically 2 (two) times daily as needed for itching. For rash 30 g 2   acetaminophen (TYLENOL) 500 MG tablet Take 2 tablets (1,000 mg total) by mouth every 6 (six) hours as needed for mild pain or moderate pain.  0   aspirin EC 81 MG tablet Take 81 mg by mouth daily as needed (for pain or headaches).      benztropine (COGENTIN) 1 MG tablet Take 1 mg at bedtime by mouth.     clindamycin (CLINDAGEL) 1 % gel Apply topically 2 (two) times daily. To skin rash on face and upper body 30 g 0   ferrous sulfate 325 (65 FE) MG EC tablet Take 1 tablet (325 mg total) by mouth daily. (Patient not taking: Reported on 05/07/2022) 30 tablet 3   Multiple Vitamin (MULTIVITAMIN WITH MINERALS) TABS tablet Take 1 tablet by mouth daily.     paliperidone (INVEGA SUSTENNA) 156 MG/ML SUSP injection Inject 156 mg every 30 (thirty) days into the muscle.     polyethylene glycol (MIRALAX / GLYCOLAX) 17 g packet Take 17 g by mouth daily as needed for mild constipation or moderate constipation. (Patient not taking: Reported on 05/07/2022)  0   No current facility-administered medications for this visit.   Facility-Administered Medications Ordered in Other Visits  Medication Dose Route Frequency Provider Last Rate  Last Admin   atropine injection 0.5 mg  0.5 mg Intravenous Once PRN Malachy Mood, MD       dexamethasone (DECADRON) 10 mg in sodium chloride 0.9 % 50 mL IVPB  10 mg Intravenous Once Malachy Mood, MD       fluorouracil (ADRUCIL) 4,350 mg in sodium chloride 0.9 % 63 mL chemo infusion  2,400 mg/m2 (Treatment Plan Recorded) Intravenous 1 day or 1  dose Malachy Mood, MD       heparin lock flush 100 unit/mL  500 Units Intracatheter Once PRN Malachy Mood, MD       irinotecan (CAMPTOSAR) 320 mg in sodium chloride 0.9 % 500 mL chemo infusion  180 mg/m2 (Treatment Plan Recorded) Intravenous Once Malachy Mood, MD       leucovorin 728 mg in sodium chloride 0.9 % 250 mL infusion  400 mg/m2 (Treatment Plan Recorded) Intravenous Once Malachy Mood, MD       palonosetron (ALOXI) injection 0.25 mg  0.25 mg Intravenous Once Malachy Mood, MD       panitumumab (VECTIBIX) 400 mg in sodium chloride 0.9 % 100 mL chemo infusion  6 mg/kg (Treatment Plan Recorded) Intravenous Once Malachy Mood, MD       sodium chloride flush (NS) 0.9 % injection 10 mL  10 mL Intracatheter PRN Malachy Mood, MD        SURGICAL HISTORY:  Past Surgical History:  Procedure Laterality Date   BRONCHIAL BIOPSY  05/11/2022   Procedure: BRONCHIAL BIOPSIES;  Surgeon: Leslye Peer, MD;  Location[facility] ENDOSCOPY;  Service: Pulmonary;;   BRONCHIAL BRUSHINGS  05/11/2022   Procedure: BRONCHIAL BRUSHINGS;  Surgeon: Leslye Peer, MD;  Location[facility] ENDOSCOPY;  Service: Pulmonary;;   BRONCHIAL NEEDLE ASPIRATION BIOPSY  05/11/2022   Procedure: BRONCHIAL NEEDLE ASPIRATION BIOPSIES;  Surgeon: Leslye Peer, MD;  Location: [facility] ENDOSCOPY;  Service: Pulmonary;;   BRONCHIAL WASHINGS  05/11/2022   Procedure: BRONCHIAL WASHINGS;  Surgeon: Leslye Peer, MD;  Location: [facility] ENDOSCOPY;  Service: Pulmonary;;   IR IMAGING GUIDED PORT INSERTION  05/25/2022   LAPAROTOMY N/A 08/08/2021   Procedure: EXPLORATORY LAPAROTOMY;  Surgeon: Berna Bue, MD;  Location: [facility] OR;  Service: General;  Laterality: N/A;   PARTIAL COLECTOMY N/A 08/08/2021   Procedure: PARTIAL COLECTOMY WITH COLOSTOMY;  Surgeon: Berna Bue, MD;  Location: [facility] OR;  Service: General;  Laterality: N/A;   VIDEO BRONCHOSCOPY WITH RADIAL ENDOBRONCHIAL ULTRASOUND  05/11/2022   Procedure: VIDEO BRONCHOSCOPY WITH RADIAL ENDOBRONCHIAL ULTRASOUND;  Surgeon: Leslye Peer, MD;  Location: [facility] ENDOSCOPY;  Service: Pulmonary;;    REVIEW OF SYSTEMS:   Review of Systems  Constitutional: Positive for weight loss. Negative for appetite change, chills, and fever HENT: Negative for mouth sores, nosebleeds, sore throat and trouble swallowing.   Eyes: Negative for eye problems and icterus.  Respiratory: Negative for cough, hemoptysis, shortness of breath and wheezing.   Cardiovascular: Negative for chest pain and leg swelling.  Gastrointestinal: Negative for abdominal pain, constipation, diarrhea (none at this time), nausea and vomiting.  Genitourinary: Negative for bladder incontinence, difficulty urinating, dysuria, frequency and hematuria.   Musculoskeletal: Negative for back pain, gait problem, neck pain and neck stiffness.  Skin: Positive for mild rash on nose and forehead. Denies pruritis. Neurological: Negative for dizziness, extremity weakness, gait problem, headaches, light-headedness and seizures.  Hematological: Negative for adenopathy. Does not bruise/bleed easily.  Psychiatric/Behavioral: Negative for confusion, depression and sleep disturbance. The patient is not nervous/anxious.     PHYSICAL  EXAMINATION:  Blood pressure 104/80, pulse 93, temperature 98.1 F (36.7 C), temperature source Oral, resp. rate 18, height 5\' 9"  (1.753 m), weight 144 lb 8 oz (65.5 kg), SpO2 100 %.  ECOG PERFORMANCE STATUS: 1  Physical Exam  Constitutional: Oriented to person, place, and time and thin appearing male and in no distress.  HENT:  Head: Normocephalic and atraumatic.  Mouth/Throat: Oropharynx is clear and moist. No oropharyngeal exudate.  Eyes: Conjunctivae are normal. Right eye exhibits no discharge. Left eye exhibits no discharge. No scleral icterus.  Neck: Normal range of motion. Neck supple.  Cardiovascular: Normal rate, regular rhythm, normal heart sounds and intact distal pulses.   Pulmonary/Chest: Effort normal and breath sounds normal. No  respiratory distress. No wheezes. No rales.  Abdominal: Soft. Bowel sounds are normal. Exhibits no distension and no mass. There is no tenderness. colostomy bag noted.  Musculoskeletal: Normal range of motion. Exhibits no edema.  Lymphadenopathy:    No cervical adenopathy.  Neurological: Alert and oriented to person, place, and time. Exhibits muscle wasting. Gait normal. Coordination normal.  Skin: Skin is warm and dry. No rash noted. Not diaphoretic. No erythema. No pallor.  Psychiatric: Mood, memory and judgment normal.  Vitals reviewed.  LABORATORY DATA: Lab Results  Component Value Date   WBC 5.3 06/24/2022   HGB 12.6 (L) 06/24/2022   HCT 36.4 (L) 06/24/2022   MCV 85.6 06/24/2022   PLT 320 06/24/2022      Chemistry      Component Value Date/Time   NA 138 06/24/2022 1046   K 3.6 06/24/2022 1046   CL 108 06/24/2022 1046   CO2 24 06/24/2022 1046   BUN 15 06/24/2022 1046   CREATININE 0.79 06/24/2022 1046      Component Value Date/Time   CALCIUM 9.3 06/24/2022 1046   ALKPHOS 150 (H) 06/24/2022 1046   AST 34 06/24/2022 1046   ALT 49 (H) 06/24/2022 1046   BILITOT 0.9 06/24/2022 1046       RADIOGRAPHIC STUDIES:  IR IMAGING GUIDED PORT INSERTION  Result Date: 05/25/2022 INDICATION: Metastatic colon cancer. EXAM: IMPLANTED PORT A CATH PLACEMENT WITH ULTRASOUND AND FLUOROSCOPIC GUIDANCE MEDICATIONS: None; The antibiotic was administered within an appropriate time interval prior to skin puncture. ANESTHESIA/SEDATION: Moderate (conscious) sedation was employed during this procedure. A total of Versed 2 mg and Fentanyl 100 mcg was administered intravenously. Moderate Sedation Time: 24 minutes. The patient's level of consciousness and vital signs were monitored continuously by radiology nursing throughout the procedure under my direct supervision. FLUOROSCOPY TIME:  Fluoroscopic dose; 0 mGy COMPLICATIONS: None immediate. PROCEDURE: The procedure, risks, benefits, and alternatives  were explained to the patient. Questions regarding the procedure were encouraged and answered. The patient understands and consents to the procedure. The RIGHT neck and chest were prepped with chlorhexidine in a sterile fashion, and a sterile drape was applied covering the operative field. Maximum barrier sterile technique with sterile gowns and gloves were used for the procedure. A timeout was performed prior to the initiation of the procedure. Local anesthesia was provided with 1% lidocaine with epinephrine. After creating a small venotomy incision, a micropuncture kit was utilized to access the internal jugular vein under direct, real-time ultrasound guidance. Ultrasound image documentation was performed. The microwire was kinked to measure appropriate catheter length. A subcutaneous port pocket was then created along the upper chest wall utilizing a combination of sharp and blunt dissection. The pocket was irrigated with sterile saline. A single lumen ISP power injectable port was chosen  for placement. The 8 Fr catheter was tunneled from the port pocket site to the venotomy incision. The port was placed in the pocket. The external catheter was trimmed to appropriate length. At the venotomy, an 8 Fr peel-away sheath was placed over a guidewire under fluoroscopic guidance. The catheter was then placed through the sheath and the sheath was removed. Final catheter positioning was confirmed and documented with a fluoroscopic spot radiograph. The port was accessed with a Huber needle, aspirated and flushed with heparinized saline. The port pocket incision was closed with interrupted 3-0 Vicryl suture then Dermabond was applied, including at the venotomy incision. Dressings were placed. The patient tolerated the procedure well without immediate post procedural complication. IMPRESSION: Successful placement of a RIGHT internal jugular approach power injectable Port-A-Cath. The tip of the catheter is positioned within  the proximal RIGHT atrium. The catheter is ready for immediate use. Roanna Banning, MD [facility] Electronically Signed   By: Roanna Banning M.D.   On: 05/25/2022 15:37     ASSESSMENT/PLAN:  Dalontae Haga is a 56 y.o. male with    1. Cancer of left colon, stage IIIB p(T3, N1aM0), MSS, lung and node metastasis in 04/2022 -presented to ED on 08/06/21 with recurrent upper abdominal pain. CT AP showed sigmoid colon perforation, with possible underlying malignancy. This was complicated with abdominal abscess, which required surgery and IV antibiotics.  -emergent partial colectomy on 08/08/21 by Dr. Doylene Canard showed invasive moderately differentiated adenocarcinoma. Margins negative, one lymph node showed metastatic carcinoma (1/12). -Staging chest CT 08/15/21 was negative for metastatic disease. -he completed 4 cycles adjuvant CAPOX 10/10/21 - 12/25/21 (though he somehow was still taking Xeloda through ~02/09/22 due to misunderstandings). -restaging PET scan on 04/08/22 showed hypermetabolism to mild abdominal retroperitoneal lymphadenopathy and several sub-centimeter bilateral pulmonary nodules. -bronchoscopy on 05/11/22 by Dr. Delton Coombes confirmed metastatic colon cancer in lung -Dr. Mosetta Putt obtained FoundationOne on the lung biopsy sample showing wild type KRAS/NRAS/BRAF. He is eligible for Vectibix. -he began FOLFIRI on 05/28/22. He tolerated very well with only one episode of diarrhea. He tolerated cycle #2 well he only had two self limiting episodes of loose stool. Vectibix was added from cycle #2. He has mild rash from vectibix. He has not used his topicals for this. I reviewed the prescriptions that he has for rash. I have resent the prescription for hydrocortisone cream.  -labs reviewed,  Adequate to proceed with cycle 3 today at full dose.  -F/U in 2 weeks for cycle #4.    2. Schizophrenia, disabled, social support -he was diagnosed with schizophrenia in his  20's, managed with medication, he is on disability. -he lives with his mother; he is able to do ADLs for himself, but not iADLS. They are both on disability. His mother also seems to have some kind of mental impairment.    3. Transaminitis -Probably related to Xeloda, he is asymptomatic -fluctuating but overall stable.      PLAN:  -proceed with C3 FOLFIRI at full dose and vectibix -lab, flush, f/u, and FOLFIRI with vectibix in 2 and 4 weeks -Reviewed he has hydrocortisone and clindamycin lotion for his rash. He states the rash does not bother him. He has not needed to use anything for it. I resent the hydrocortisone because it is unclear if he has this prescription.  -I printed out a copy of their schedule and reviewed it with them. He will return on Friday for pump d/c and then for his next follow  up in 2 weeks with Dr. Mosetta Putt.    No orders of the defined types were placed in this encounter.   The total time spent in the appointment was 20-29 minutes.   Suanne Minahan L Almira Phetteplace, PA-C 06/24/22

## 2022-06-23 MED FILL — Dexamethasone Sodium Phosphate Inj 100 MG/10ML: INTRAMUSCULAR | Qty: 1 | Status: AC

## 2022-06-24 ENCOUNTER — Inpatient Hospital Stay (HOSPITAL_BASED_OUTPATIENT_CLINIC_OR_DEPARTMENT_OTHER): Payer: Medicaid Other | Admitting: Physician Assistant

## 2022-06-24 ENCOUNTER — Encounter: Payer: Self-pay | Admitting: Hematology

## 2022-06-24 ENCOUNTER — Inpatient Hospital Stay: Payer: Medicaid Other

## 2022-06-24 ENCOUNTER — Other Ambulatory Visit: Payer: Self-pay

## 2022-06-24 VITALS — BP 104/80 | HR 93 | Temp 98.1°F | Resp 18 | Ht 69.0 in | Wt 144.5 lb

## 2022-06-24 DIAGNOSIS — Z95828 Presence of other vascular implants and grafts: Secondary | ICD-10-CM

## 2022-06-24 DIAGNOSIS — C186 Malignant neoplasm of descending colon: Secondary | ICD-10-CM

## 2022-06-24 DIAGNOSIS — C7800 Secondary malignant neoplasm of unspecified lung: Secondary | ICD-10-CM

## 2022-06-24 DIAGNOSIS — Z5111 Encounter for antineoplastic chemotherapy: Secondary | ICD-10-CM

## 2022-06-24 LAB — CMP (CANCER CENTER ONLY)
ALT: 49 U/L — ABNORMAL HIGH (ref 0–44)
AST: 34 U/L (ref 15–41)
Albumin: 4.1 g/dL (ref 3.5–5.0)
Alkaline Phosphatase: 150 U/L — ABNORMAL HIGH (ref 38–126)
Anion gap: 6 (ref 5–15)
BUN: 15 mg/dL (ref 6–20)
CO2: 24 mmol/L (ref 22–32)
Calcium: 9.3 mg/dL (ref 8.9–10.3)
Chloride: 108 mmol/L (ref 98–111)
Creatinine: 0.79 mg/dL (ref 0.61–1.24)
GFR, Estimated: >60 mL/min (ref >60)
Glucose, Bld: 104 mg/dL — ABNORMAL HIGH (ref 70–99)
Potassium: 3.6 mmol/L (ref 3.5–5.1)
Sodium: 138 mmol/L (ref 135–145)
Total Bilirubin: 0.9 mg/dL (ref 0.3–1.2)
Total Protein: 7.8 g/dL (ref 6.5–8.1)

## 2022-06-24 LAB — CBC WITH DIFFERENTIAL (CANCER CENTER ONLY)
Abs Immature Granulocytes: 0.01 10*3/uL (ref 0.00–0.07)
Basophils Absolute: 0 10*3/uL (ref 0.0–0.1)
Basophils Relative: 1 %
Eosinophils Absolute: 0.2 10*3/uL (ref 0.0–0.5)
Eosinophils Relative: 5 %
HCT: 36.4 % — ABNORMAL LOW (ref 39.0–52.0)
Hemoglobin: 12.6 g/dL — ABNORMAL LOW (ref 13.0–17.0)
Immature Granulocytes: 0 %
Lymphocytes Relative: 21 %
Lymphs Abs: 1.1 10*3/uL (ref 0.7–4.0)
MCH: 29.6 pg (ref 26.0–34.0)
MCHC: 34.6 g/dL (ref 30.0–36.0)
MCV: 85.6 fL (ref 80.0–100.0)
Monocytes Absolute: 0.3 10*3/uL (ref 0.1–1.0)
Monocytes Relative: 6 %
Neutro Abs: 3.6 10*3/uL (ref 1.7–7.7)
Neutrophils Relative %: 67 %
Platelet Count: 320 10*3/uL (ref 150–400)
RBC: 4.25 MIL/uL (ref 4.22–5.81)
RDW: 12.7 % (ref 11.5–15.5)
WBC Count: 5.3 10*3/uL (ref 4.0–10.5)
nRBC: 0 % (ref 0.0–0.2)

## 2022-06-24 LAB — MAGNESIUM: Magnesium: 2.2 mg/dL (ref 1.7–2.4)

## 2022-06-24 MED ORDER — SODIUM CHLORIDE 0.9% FLUSH
10.0000 mL | Freq: Once | INTRAVENOUS | Status: AC
  Administered 2022-06-24: 10 mL

## 2022-06-24 MED ORDER — HYDROCORTISONE 0.5 % EX CREA: 1.0000 | TOPICAL_CREAM | Freq: Every day | CUTANEOUS | 0 refills | Status: DC | PRN

## 2022-06-24 MED ORDER — SODIUM CHLORIDE 0.9 % IV SOLN: Freq: Once | INTRAVENOUS | Status: AC

## 2022-06-24 MED ORDER — SODIUM CHLORIDE 0.9 % IV SOLN
180.0000 mg/m2 | Freq: Once | INTRAVENOUS | Status: AC
  Administered 2022-06-24: 320 mg via INTRAVENOUS
  Filled 2022-06-24: qty 15

## 2022-06-24 MED ORDER — SODIUM CHLORIDE 0.9 % IV SOLN
6.0000 mg/kg | Freq: Once | INTRAVENOUS | Status: AC
  Administered 2022-06-24: 400 mg via INTRAVENOUS
  Filled 2022-06-24: qty 20

## 2022-06-24 MED ORDER — ATROPINE SULFATE 1 MG/ML IV SOLN
0.5000 mg | Freq: Once | INTRAVENOUS | Status: AC | PRN
  Administered 2022-06-24: 0.5 mg via INTRAVENOUS
  Filled 2022-06-24: qty 1

## 2022-06-24 MED ORDER — SODIUM CHLORIDE 0.9 % IV SOLN
10.0000 mg | Freq: Once | INTRAVENOUS | Status: AC
  Administered 2022-06-24: 10 mg via INTRAVENOUS
  Filled 2022-06-24: qty 10

## 2022-06-24 MED ORDER — HYDROCORTISONE 1 % EX CREA: 1.0000 | TOPICAL_CREAM | Freq: Two times a day (BID) | CUTANEOUS | 2 refills | Status: AC | PRN

## 2022-06-24 MED ORDER — SODIUM CHLORIDE 0.9% FLUSH: 10.0000 mL | INTRAVENOUS | Status: DC | PRN

## 2022-06-24 MED ORDER — PALONOSETRON HCL INJECTION 0.25 MG/5ML
0.2500 mg | Freq: Once | INTRAVENOUS | Status: AC
  Administered 2022-06-24: 0.25 mg via INTRAVENOUS
  Filled 2022-06-24: qty 5

## 2022-06-24 MED ORDER — SODIUM CHLORIDE 0.9 % IV SOLN
2400.0000 mg/m2 | INTRAVENOUS | Status: DC
  Administered 2022-06-24: 4350 mg via INTRAVENOUS
  Filled 2022-06-24: qty 87

## 2022-06-24 MED ORDER — HEPARIN SOD (PORK) LOCK FLUSH 100 UNIT/ML IV SOLN: 500.0000 [IU] | Freq: Once | INTRAVENOUS | Status: DC | PRN

## 2022-06-24 MED ORDER — SODIUM CHLORIDE 0.9 % IV SOLN
400.0000 mg/m2 | Freq: Once | INTRAVENOUS | Status: AC
  Administered 2022-06-24: 728 mg via INTRAVENOUS
  Filled 2022-06-24: qty 25

## 2022-06-24 NOTE — Progress Notes (Signed)
Approved patient for one-time $1000 [employer] grant to assist with personal expenses while going through treatment. Approval letter and expense sheet given as well as gas card by registration. Advised to have patient's mom call me on Monday to discuss in further detail.  She has my card for any additional financial questions or concerns.

## 2022-06-24 NOTE — Patient Instructions (Signed)
[facility] ONCOL[facility]Y  Discharge Instructions: Thank you for choosing [facility] to provide your oncology and hematology care.   If you have a lab appointment with the [facility], please go directly to the [facility] and check in at the registration area.   Wear comfortable clothing and clothing appropriate for easy access to any Portacath or PICC line.   We strive to give you quality time with your provider. You may need to reschedule your appointment if you arrive late (15 or more minutes).  Arriving late affects you and other patients whose appointments are after yours.  Also, if you miss three or more appointments without notifying the office, you may be dismissed from the clinic at the provider's discretion.      For prescription refill requests, have your pharmacy contact our office and allow 72 hours for refills to be completed.    Today you received the following chemotherapy and/or immunotherapy agents: Vectibix, irinotecan, and fluorouracil       To help prevent nausea and vomiting after your treatment, we encourage you to take your nausea medication as directed.  BELOW ARE SYMPTOMS THAT SHOULD BE REPORTED IMMEDIATELY: *FEVER GREATER THAN 100.4 F (38 C) OR HIGHER *CHILLS OR SWEATING *NAUSEA AND VOMITING THAT IS NOT CONTROLLED WITH YOUR NAUSEA MEDICATION *UNUSUAL SHORTNESS OF BREATH *UNUSUAL BRUISING OR BLEEDING *URINARY PROBLEMS (pain or burning when urinating, or frequent urination) *BOWEL PROBLEMS (unusual diarrhea, constipation, pain near the anus) TENDERNESS IN MOUTH AND THROAT WITH OR WITHOUT PRESENCE OF ULCERS (sore throat, sores in mouth, or a toothache) UNUSUAL RASH, SWELLING OR PAIN  UNUSUAL VAGINAL DISCHARGE OR ITCHING   Items with * indicate a potential emergency and should be followed up as soon as possible or go to the Emergency Department if any problems should occur.  Please show the CHEMOTHERAPY ALERT CARD or  IMMUNOTHERAPY ALERT CARD at check-in to the Emergency Department and triage nurse.  Should you have questions after your visit or need to cancel or reschedule your appointment, please contact [facility]  Dept: [phone]  and follow the prompts.  Office hours are 8:00 a.m. to 4:30 p.m. Monday - Friday. Please note that voicemails left after 4:00 p.m. may not be returned until the following business day.  We are closed weekends and major holidays. You have access to a nurse at all times for urgent questions. Please call the main number to the clinic Dept: [phone] and follow the prompts.   For any non-urgent questions, you may also contact your provider using MyChart. We now offer e-Visits for anyone 41 and older to request care online for non-urgent symptoms. For details visit [email or web address].   Also download the MyChart app! Go to the app store, search "MyChart", open the app, select [facility], and log in with your MyChart username and password.  Masks are optional in the cancer centers. If you would like for your care team to wear a mask while they are taking care of you, please let them know. You may have one support Josely Moffat who is at least 56 years old accompany you for your appointments.  Panitumumab Injection What is this medication? PANITUMUMAB (pan i TOOM ue mab) treats colorectal cancer. It works by blocking a protein that causes cancer cells to grow and multiply. This helps to slow or stop the spread of cancer cells. It is a monoclonal antibody. This medicine may be used for other purposes; ask your health care  provider or pharmacist if you have questions. COMMON BRAND NAME(S): Vectibix What should I tell my care team before I take this medication? They need to know if you have any of these conditions: Eye disease Low levels of magnesium in the blood Lung disease An unusual or allergic reaction to panitumumab, other medications, foods,  dyes, or preservatives Pregnant or trying to get pregnant Breast-feeding How should I use this medication? This medication is injected into a vein. It is given by your care team in a hospital or clinic setting. Talk to your care team about the use of this medication in children. Special care may be needed. Overdosage: If you think you have taken too much of this medicine contact a poison control center or emergency room at once. NOTE: This medicine is only for you. Do not share this medicine with others. What if I miss a dose? Keep appointments for follow-up doses. It is important not to miss your dose. Call your care team if you are unable to keep an appointment. What may interact with this medication? Bevacizumab This list may not describe all possible interactions. Give your health care provider a list of all the medicines, herbs, non-prescription drugs, or dietary supplements you use. Also tell them if you smoke, drink alcohol, or use illegal drugs. Some items may interact with your medicine. What should I watch for while using this medication? Your condition will be monitored carefully while you are receiving this medication. This medication may make you feel generally unwell. This is not uncommon as chemotherapy can affect healthy cells as well as cancer cells. Report any side effects. Continue your course of treatment even though you feel ill unless your care team tells you to stop. This medication can make you more sensitive to the sun. Keep out of the sun while receiving this medication and for 2 months after stopping therapy. If you cannot avoid being in the sun, wear protective clothing and sunscreen. Do not use sun lamps, tanning beds, or tanning booths. Check with your care team if you have severe diarrhea, nausea, and vomiting or if you sweat a lot. The loss of too much body fluid may make it dangerous for you to take this medication. This medication may cause serious skin reactions.  They can happen weeks to months after starting the medication. Contact your care team right away if you notice fevers or flu-like symptoms with a rash. The rash may be red or purple and then turn into blisters or peeling of the skin. You may also notice a red rash with swelling of the face, lips, or lymph nodes in your neck or under your arms. Talk to your care team if you may be pregnant. Serious birth defects can occur if you take this medication during pregnancy and for 2 months after the last dose. Contraception is recommended while taking this medication and for 2 months after the last dose. Your care team can help you find the option that works for you. Do not breastfeed while taking this medication and for 2 months after the last dose. This medication may cause infertility. Talk to your care team if you are concerned about your fertility. What side effects may I notice from receiving this medication? Side effects that you should report to your care team as soon as possible: Allergic reactions--skin rash, itching, hives, swelling of the face, lips, tongue, or throat Dry cough, shortness of breath or trouble breathing Eye pain, redness, irritation, or discharge with blurry or decreased  vision Infusion reactions--chest pain, shortness of breath or trouble breathing, feeling faint or lightheaded Low magnesium level--muscle pain or cramps, unusual weakness or fatigue, fast or irregular heartbeat, tremors Low potassium level--muscle pain or cramps, unusual weakness or fatigue, fast or irregular heartbeat, constipation Redness, blistering, peeling, or loosening of the skin, including inside the mouth Skin reactions on sun-exposed areas Side effects that usually do not require medical attention (report to your care team if they continue or are bothersome): Change in nail shape, thickness, or color Diarrhea Dry skin Fatigue Nausea Vomiting This list may not describe all possible side effects. Call  your doctor for medical advice about side effects. You may report side effects to FDA at 1-800-FDA-1088. Where should I keep my medication? This medication is given in a hospital or clinic. It will not be stored at home. NOTE: This sheet is a summary. It may not cover all possible information. If you have questions about this medicine, talk to your doctor, pharmacist, or health care provider.  2023 Elsevier/Gold Standard (2021-12-03 00:00:00)

## 2022-06-26 ENCOUNTER — Inpatient Hospital Stay: Payer: Medicaid Other

## 2022-06-26 VITALS — BP 108/72 | HR 88 | Temp 98.2°F | Resp 18

## 2022-06-26 DIAGNOSIS — C186 Malignant neoplasm of descending colon: Secondary | ICD-10-CM

## 2022-06-26 MED ORDER — HEPARIN SOD (PORK) LOCK FLUSH 100 UNIT/ML IV SOLN: 500.0000 [IU] | Freq: Once | INTRAVENOUS | Status: DC | PRN

## 2022-06-26 MED ORDER — SODIUM CHLORIDE 0.9% FLUSH: 10.0000 mL | INTRAVENOUS | Status: DC | PRN

## 2022-07-07 NOTE — Progress Notes (Unsigned)
[facility]   Telephone:(336) [phone] Fax:(336) [phone]   Clinic Follow up Note   Patient Care Team: Medicine, [facility] as PCP - General Clovis Riley, MD as Consulting Physician (General Surgery) Truitt Merle, MD as Consulting Physician (Oncology)  Date of Service:  07/09/2022  CHIEF COMPLAINT: f/u of metastatic colon cancer   CURRENT THERAPY:  FOLFIRI, q14d, starting 05/28/22 -Vectibix to be added with cycle 2  ASSESSMENT:  Matthew [name] is a 56 y.o. 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  -he started FOLFIRI on 05/08/2022, Vectibix added from cycle 2, tolerating well overall   Schizophrenia ([place]) -he was diagnosed with schizophrenia in his 20's, managed with medication, he is on disability. -he lives with his mother; he is able to do ADLs for himself, but not iADLS. They are both on disability. His mother also seems to have some kind of mental impairment.  Skin rash on face -Secondary to Vectibix -We will continue topical hydrocortisone and clindamycin gel, overall mild rash    PLAN: - Lab reviewed -Order Ct Scan to be done around 12/28  - Proceed with C4 FOLFIRI+Vectibix today  -Lab,flush,f/u and FOLFIRI+Vectibix 07/23/2022      SUMMARY OF ONCOLOGIC HISTORY: Oncology History Overview Note   Cancer Staging  Cancer of left colon [facility]) Staging form: Colon and Rectum, AJCC 8th Edition - Pathologic stage from 08/08/2021: Stage IIIB (pT3, pN1a, cM0) - Signed by Truitt Merle, MD on 08/26/2021    Cancer of left colon ([place])  04/01/2020 Imaging   IMPRESSION: 1. Gallbladder decompressed bowel also partially calcified gallstones. No pericholecystic inflammation though if there is persisting  clinical concern for cholecystitis right upper quadrant ultrasound could be obtained. 2. Circumferential thickening of the distal thoracic esophagus. Could reflect features of esophagitis. Correlate with clinical symptoms and consider endoscopy as clinically warranted. 3. Additional segmental thickening of the mid to distal sigmoid with focal narrowing. No acute surrounding inflammation or resulting obstruction. Findings are nonspecific, and could reflect sequela of prior inflammation/infection. However, recommend correlation with colonoscopy if not recently performed. 4. Mild circumferential bladder wall thickening and indentation of the bladder base by an enlarged prostate. Possibly sequela of chronic outlet obstruction though could correlate with urinalysis to exclude cystitis. 5. Aortic Atherosclerosis (ICD10-I70.0).   08/06/2021 Imaging   IMPRESSION: Sigmoid colonic perforation with small free intraperitoneal gas and infiltration of the mesenteric and omental fat in keeping with changes of peritonitis.   Long segment inflammatory stranding of the sigmoid colon in keeping with a severe infectious or inflammatory colitis. This terminates an area of irregular mural thickening, infiltrative soft tissue within the colonic mesentery, and focal dystrophic calcification. This may represent a chronic inflammatory process, however, a perforated malignancy could appear similarly. There are 2 separate points of perforation which again raise the question of an underlying malignancy.   7.1 cm gas and fluid containing pericolonic abscess within the sigmoid mesentery.   Marked inflammatory change of the terminal ileum adjacent to the pericolonic abscess with resultant small bowel obstruction. Fluid within the distal esophagus likely relates to gastroesophageal reflux the setting of vomiting.   Aortic Atherosclerosis (ICD10-I70.0).   08/08/2021 Cancer Staging   Staging form: Colon and Rectum, AJCC  8th Edition - Pathologic stage from  08/08/2021: Stage IIIB (pT3, pN1a, cM0) - Signed by Truitt Merle, MD on 08/26/2021 Stage prefix: Initial diagnosis Total positive nodes: 1 Histologic grading system: 4 grade system Histologic grade (G): G2 Residual tumor (R): R0 - None   08/08/2021 Definitive Surgery   FINAL MICROSCOPIC DIAGNOSIS:   A. COLON, SIGMOID, PARTIAL COLECTOMY:  - Invasive moderately differentiated adenocarcinoma.  - Metastatic carcinoma involving one of twelve lymph nodes (1/12).  - See oncology table below.   ADDENDUM:  Mismatch Repair Protein (IHC)  SUMMARY INTERPRETATION: NORMAL    08/14/2021 Imaging   EXAM: CT ABDOMEN AND PELVIS WITH CONTRAST  IMPRESSION: 1. Post recent sigmoid colectomy with left lower quadrant colostomy. Two small residual foci of air within the pelvic mesentery with mild adjacent thickening, but no abscess or drainable collection. Trace non organized free fluid and stranding in the pelvis. 2. Short segment of small bowel wall thickening and inflammation in the pelvis involving the distal ileum, likely reactive. 3. Dilated distal esophagus, stomach, and small bowel, without discrete transition point, favoring postoperative ileus. 4. Small bilateral pleural effusions and compressive atelectasis. 5. Heterogeneous partially enhancing 14 mm lymph node in the retroperitoneum at the aortoiliac bifurcation, not significantly changed from prior exam. Suspected additional lymph nodes in the anterior common iliac space, not significantly changed from prior exam. Recommend attention at follow-up. 6. Additional chronic findings as described.     08/15/2021 Imaging   EXAM: CT CHEST WITH CONTRAST  IMPRESSION: 1. No evidence of thoracic metastasis. 2. Bilateral small layering pleural effusions with passive atelectasis   08/26/2021 Initial Diagnosis   Cancer of left colon ([place])   10/10/2021 - 12/12/2021 Chemotherapy   Patient is on Treatment Plan : COLORECTAL  Xelox ([place]) q21d     05/28/2022 - 05/28/2022 Chemotherapy   Patient is on Treatment Plan : COLORECTAL FOLFIRI + Bevacizumab q14d     05/28/2022 -  Chemotherapy   Patient is on Treatment Plan : COLORECTAL FOLFIRI + Panitumumab q14d        INTERVAL HISTORY:  Matthew [name] is here for a follow up of  metastatic colon cancer   He was last seen by PA-C Cassie on 06/24/2022 He presents to the clinic accompanied by mother. Pt reports of rash on  face and legs.     All other systems were reviewed with the patient and are negative.  MEDICAL HISTORY:  Past Medical History:  Diagnosis Date   Cancer ([place])    Schizophrenia [facility])     SURGICAL HISTORY: Past Surgical History:  Procedure Laterality Date   BRONCHIAL BIOPSY  05/11/2022   Procedure: BRONCHIAL BIOPSIES;  Surgeon: Collene Gobble, MD;  Location: [facility] ENDOSCOPY;  Service: Pulmonary;;   BRONCHIAL BRUSHINGS  05/11/2022   Procedure: BRONCHIAL BRUSHINGS;  Surgeon: Collene Gobble, MD;  Location: [facility] ENDOSCOPY;  Service: Pulmonary;;   BRONCHIAL NEEDLE ASPIRATION BIOPSY  05/11/2022   Procedure: BRONCHIAL NEEDLE ASPIRATION BIOPSIES;  Surgeon: Collene Gobble, MD;  Location: [facility];  Service: Pulmonary;;   BRONCHIAL WASHINGS  05/11/2022   Procedure: BRONCHIAL WASHINGS;  Surgeon: Collene Gobble, MD;  Location: [facility];  Service: Pulmonary;;   IR IMAGING GUIDED PORT INSERTION  05/25/2022   LAPAROTOMY N/A 08/08/2021   Procedure: EXPLORATORY LAPAROTOMY;  Surgeon: Clovis Riley, MD;  Location: [facility];  Service: General;  Laterality: N/A;   PARTIAL COLECTOMY N/A 08/08/2021   Procedure: PARTIAL COLECTOMY WITH COLOSTOMY;  Surgeon: Clovis Riley, MD;  Location: [facility];  Service: General;  Laterality: N/A;  VIDEO BRONCHOSCOPY WITH RADIAL ENDOBRONCHIAL ULTRASOUND  05/11/2022   Procedure: VIDEO BRONCHOSCOPY WITH RADIAL ENDOBRONCHIAL ULTRASOUND;  Surgeon: Collene Gobble, MD;  Location: [facility] ENDOSCOPY;  Service: Pulmonary;;    I have  reviewed the social history and family history with the patient and they are unchanged from previous note.  ALLERGIES:  has No Known Allergies.  MEDICATIONS:  Current Outpatient Medications  Medication Sig Dispense Refill   acetaminophen (TYLENOL) 500 MG tablet Take 2 tablets (1,000 mg total) by mouth every 6 (six) hours as needed for mild pain or moderate pain.  0   aspirin EC 81 MG tablet Take 81 mg by mouth daily as needed (for pain or headaches).      benztropine (COGENTIN) 1 MG tablet Take 1 mg at bedtime by mouth.     clindamycin (CLINDAGEL) 1 % gel Apply topically 2 (two) times daily. To skin rash on face and upper body 30 g 0   ferrous sulfate 325 (65 FE) MG EC tablet Take 1 tablet (325 mg total) by mouth daily. (Patient not taking: Reported on 05/07/2022) 30 tablet 3   hydrocortisone cream 1 % Apply 1 Application topically 2 (two) times daily as needed for itching. For rash 30 g 2   Multiple Vitamin (MULTIVITAMIN WITH MINERALS) TABS tablet Take 1 tablet by mouth daily.     paliperidone (INVEGA SUSTENNA) 156 MG/ML SUSP injection Inject 156 mg every 30 (thirty) days into the muscle.     polyethylene glycol (MIRALAX / GLYCOLAX) 17 g packet Take 17 g by mouth daily as needed for mild constipation or moderate constipation. (Patient not taking: Reported on 05/07/2022)  0   No current facility-administered medications for this visit.   Facility-Administered Medications Ordered in Other Visits  Medication Dose Route Frequency Provider Last Rate Last Admin   atropine injection 0.5 mg  0.5 mg Intravenous Once PRN Truitt Merle, MD       dexamethasone (DECADRON) 10 mg in sodium chloride 0.9 % 50 mL IVPB  10 mg Intravenous Once Truitt Merle, MD 204 mL/hr at 07/09/22 1019 10 mg at 07/09/22 1019   fluorouracil (ADRUCIL) 4,350 mg in sodium chloride 0.9 % 63 mL chemo infusion  2,400 mg/m2 (Treatment Plan Recorded) Intravenous 1 day or 1 dose Truitt Merle, MD       heparin lock flush 100 unit/mL  500 Units  Intracatheter Once PRN Truitt Merle, MD       irinotecan (CAMPTOSAR) 320 mg in sodium chloride 0.9 % 500 mL chemo infusion  180 mg/m2 (Treatment Plan Recorded) Intravenous Once Truitt Merle, MD       leucovorin 728 mg in sodium chloride 0.9 % 250 mL infusion  400 mg/m2 (Treatment Plan Recorded) Intravenous Once Truitt Merle, MD       panitumumab (VECTIBIX) 400 mg in sodium chloride 0.9 % 100 mL chemo infusion  6 mg/kg (Treatment Plan Recorded) Intravenous Once Truitt Merle, MD       sodium chloride flush (NS) 0.9 % injection 10 mL  10 mL Intracatheter PRN Truitt Merle, MD        PHYSICAL EXAMINATION: ECOG PERFORMANCE STATUS: 0 - Asymptomatic  Vitals:   07/09/22 0910  BP: 112/79  Pulse: 98  Resp: 18  Temp: 98.9 F (37.2 C)  SpO2: 100%   Wt Readings from Last 3 Encounters:  07/09/22 147 lb 3.2 oz (66.8 kg)  06/24/22 144 lb 8 oz (65.5 kg)  06/10/22 152 lb 1.6 oz (69 kg)    GENERAL:alert, no distress  and comfortable SKIN: skin color, texture, turgor are normal, rashes  on the face or significant lesions NECK: supple, thyroid normal size, non-tender, without nodularity LYMPH:  no palpable lymphadenopathy in the cervical, axillary  LUNGS: clear to auscultation and percussion with normal breathing effort(-) HEART: regular rate & rhythm and no murmurs and no lower extremity edema(-) ABDOMEN:abdomen soft, non-tender and normal bowel sounds(-) Musculoskeletal:no cyanosis of digits and no clubbing  NEURO: alert & oriented x 3 with fluent speech, no focal motor/sensory deficits  LABORATORY DATA:  I have reviewed the data as listed    Latest Ref Rng & Units 07/09/2022    8:52 AM 06/24/2022   10:46 AM 06/10/2022   10:43 AM  CBC  WBC 4.0 - 10.5 K/uL 3.5  5.3  4.6   Hemoglobin 13.0 - 17.0 g/dL 12.5  12.6  12.2   Hematocrit 39.0 - 52.0 % 36.7  36.4  35.0   Platelets 150 - 400 K/uL 350  320  298         Latest Ref Rng & Units 07/09/2022    8:52 AM 06/24/2022   10:46 AM 06/10/2022   10:43 AM  CMP   Glucose 70 - 99 mg/dL 108  104  106   BUN 6 - 20 mg/dL _0 Creatinine 0.61 - 1.24 mg/dL 0.66  0.79  0.86   Sodium 135 - 145 mmol/L 139  138  138   Potassium 3.5 - 5.1 mmol/L 4.1  3.6  3.8   Chloride 98 - 111 mmol/L 106  108  105   CO2 22 - 32 mmol/L _1 Calcium 8.9 - 10.3 mg/dL 9.8  9.3  9.6   Total Protein 6.5 - 8.1 g/dL 7.1  7.8  7.2   Total Bilirubin 0.3 - 1.2 mg/dL 0.7  0.9  0.6   Alkaline Phos 38 - 126 U/L 183  150  181   AST 15 - 41 U/L 65  34  33   ALT 0 - 44 U/L 98  49  68       RADIOGRAPHIC STUDIES: I have personally reviewed the radiological images as listed and agreed with the findings in the report. No results found.    Orders Placed This Encounter  Procedures   CT CHEST ABDOMEN PELVIS W CONTRAST    Standing Status:   Future    Standing Expiration Date:   07/10/2023    Order Specific Question:   Preferred imaging location?    Answer:   [facility]    Order Specific Question:   Release to patient    Answer:   Immediate    Order Specific Question:   Is Oral Contrast requested for this exam?    Answer:   Yes, Per Radiology protocol   CBC with Differential ([facility] Only)    Standing Status:   Future    Standing Expiration Date:   08/07/2023   CMP ([facility] only)    Standing Status:   Future    Standing Expiration Date:   08/07/2023   Magnesium    Standing Status:   Future    Standing Expiration Date:   08/07/2023   All questions were answered. The patient knows to call the clinic with any problems, questions or concerns. No barriers to learning was detected. The total time spent in the appointment was 30 minutes.     Truitt Merle, MD 07/09/2022   Melodye Ped  Avril Busser, CMA, am acting as scribe for Truitt Merle, MD.   I have reviewed the above documentation for accuracy and completeness, and I agree with the above.

## 2022-07-08 MED FILL — Dexamethasone Sodium Phosphate Inj 100 MG/10ML: INTRAMUSCULAR | Qty: 1 | Status: AC

## 2022-07-09 ENCOUNTER — Inpatient Hospital Stay: Payer: Medicaid Other | Attending: Hematology

## 2022-07-09 ENCOUNTER — Other Ambulatory Visit: Payer: Self-pay

## 2022-07-09 ENCOUNTER — Encounter: Payer: Self-pay | Admitting: Hematology

## 2022-07-09 ENCOUNTER — Inpatient Hospital Stay: Payer: Medicaid Other

## 2022-07-09 ENCOUNTER — Inpatient Hospital Stay (HOSPITAL_BASED_OUTPATIENT_CLINIC_OR_DEPARTMENT_OTHER): Payer: Medicaid Other | Admitting: Hematology

## 2022-07-09 DIAGNOSIS — Z95828 Presence of other vascular implants and grafts: Secondary | ICD-10-CM

## 2022-07-09 DIAGNOSIS — C7800 Secondary malignant neoplasm of unspecified lung: Secondary | ICD-10-CM | POA: Diagnosis not present

## 2022-07-09 DIAGNOSIS — C186 Malignant neoplasm of descending colon: Secondary | ICD-10-CM

## 2022-07-09 DIAGNOSIS — C187 Malignant neoplasm of sigmoid colon: Secondary | ICD-10-CM | POA: Insufficient documentation

## 2022-07-09 DIAGNOSIS — Z5111 Encounter for antineoplastic chemotherapy: Secondary | ICD-10-CM | POA: Diagnosis present

## 2022-07-09 DIAGNOSIS — Z5112 Encounter for antineoplastic immunotherapy: Secondary | ICD-10-CM | POA: Insufficient documentation

## 2022-07-09 DIAGNOSIS — Z79899 Other long term (current) drug therapy: Secondary | ICD-10-CM | POA: Diagnosis not present

## 2022-07-09 DIAGNOSIS — F209 Schizophrenia, unspecified: Secondary | ICD-10-CM

## 2022-07-09 LAB — CBC WITH DIFFERENTIAL (CANCER CENTER ONLY)
Abs Immature Granulocytes: 0.01 10*3/uL (ref 0.00–0.07)
Basophils Absolute: 0 10*3/uL (ref 0.0–0.1)
Basophils Relative: 1 %
Eosinophils Absolute: 0.3 10*3/uL (ref 0.0–0.5)
Eosinophils Relative: 8 %
HCT: 36.7 % — ABNORMAL LOW (ref 39.0–52.0)
Hemoglobin: 12.5 g/dL — ABNORMAL LOW (ref 13.0–17.0)
Immature Granulocytes: 0 %
Lymphocytes Relative: 25 %
Lymphs Abs: 0.9 10*3/uL (ref 0.7–4.0)
MCH: 30 pg (ref 26.0–34.0)
MCHC: 34.1 g/dL (ref 30.0–36.0)
MCV: 88.2 fL (ref 80.0–100.0)
Monocytes Absolute: 0.3 10*3/uL (ref 0.1–1.0)
Monocytes Relative: 7 %
Neutro Abs: 2 10*3/uL (ref 1.7–7.7)
Neutrophils Relative %: 59 %
Platelet Count: 350 10*3/uL (ref 150–400)
RBC: 4.16 MIL/uL — ABNORMAL LOW (ref 4.22–5.81)
RDW: 14.3 % (ref 11.5–15.5)
WBC Count: 3.5 10*3/uL — ABNORMAL LOW (ref 4.0–10.5)
nRBC: 0 % (ref 0.0–0.2)

## 2022-07-09 LAB — CMP (CANCER CENTER ONLY)
ALT: 98 U/L — ABNORMAL HIGH (ref 0–44)
AST: 65 U/L — ABNORMAL HIGH (ref 15–41)
Albumin: 4.1 g/dL (ref 3.5–5.0)
Alkaline Phosphatase: 183 U/L — ABNORMAL HIGH (ref 38–126)
Anion gap: 8 (ref 5–15)
BUN: 9 mg/dL (ref 6–20)
CO2: 25 mmol/L (ref 22–32)
Calcium: 9.8 mg/dL (ref 8.9–10.3)
Chloride: 106 mmol/L (ref 98–111)
Creatinine: 0.66 mg/dL (ref 0.61–1.24)
GFR, Estimated: >60 mL/min (ref >60)
Glucose, Bld: 108 mg/dL — ABNORMAL HIGH (ref 70–99)
Potassium: 4.1 mmol/L (ref 3.5–5.1)
Sodium: 139 mmol/L (ref 135–145)
Total Bilirubin: 0.7 mg/dL (ref 0.3–1.2)
Total Protein: 7.1 g/dL (ref 6.5–8.1)

## 2022-07-09 LAB — MAGNESIUM: Magnesium: 2 mg/dL (ref 1.7–2.4)

## 2022-07-09 MED ORDER — SODIUM CHLORIDE 0.9 % IV SOLN
400.0000 mg/m2 | Freq: Once | INTRAVENOUS | Status: AC
  Administered 2022-07-09: 728 mg via INTRAVENOUS
  Filled 2022-07-09: qty 25

## 2022-07-09 MED ORDER — SODIUM CHLORIDE 0.9 % IV SOLN: Freq: Once | INTRAVENOUS | Status: AC

## 2022-07-09 MED ORDER — SODIUM CHLORIDE 0.9 % IV SOLN
6.0000 mg/kg | Freq: Once | INTRAVENOUS | Status: AC
  Administered 2022-07-09: 400 mg via INTRAVENOUS
  Filled 2022-07-09: qty 20

## 2022-07-09 MED ORDER — SODIUM CHLORIDE 0.9 % IV SOLN
10.0000 mg | Freq: Once | INTRAVENOUS | Status: AC
  Administered 2022-07-09: 10 mg via INTRAVENOUS
  Filled 2022-07-09: qty 10

## 2022-07-09 MED ORDER — ATROPINE SULFATE 1 MG/ML IV SOLN
0.5000 mg | Freq: Once | INTRAVENOUS | Status: AC | PRN
  Administered 2022-07-09: 0.5 mg via INTRAVENOUS
  Filled 2022-07-09: qty 1

## 2022-07-09 MED ORDER — SODIUM CHLORIDE 0.9% FLUSH: 10.0000 mL | INTRAVENOUS | Status: DC | PRN

## 2022-07-09 MED ORDER — HEPARIN SOD (PORK) LOCK FLUSH 100 UNIT/ML IV SOLN: 500.0000 [IU] | Freq: Once | INTRAVENOUS | Status: DC | PRN

## 2022-07-09 MED ORDER — SODIUM CHLORIDE 0.9 % IV SOLN
2400.0000 mg/m2 | INTRAVENOUS | Status: DC
  Administered 2022-07-09: 4350 mg via INTRAVENOUS
  Filled 2022-07-09: qty 87

## 2022-07-09 MED ORDER — PALONOSETRON HCL INJECTION 0.25 MG/5ML
0.2500 mg | Freq: Once | INTRAVENOUS | Status: AC
  Administered 2022-07-09: 0.25 mg via INTRAVENOUS
  Filled 2022-07-09: qty 5

## 2022-07-09 MED ORDER — SODIUM CHLORIDE 0.9 % IV SOLN
180.0000 mg/m2 | Freq: Once | INTRAVENOUS | Status: AC
  Administered 2022-07-09: 320 mg via INTRAVENOUS
  Filled 2022-07-09: qty 15

## 2022-07-09 MED ORDER — SODIUM CHLORIDE 0.9% FLUSH
10.0000 mL | Freq: Once | INTRAVENOUS | Status: AC
  Administered 2022-07-09: 10 mL

## 2022-07-09 NOTE — Assessment & Plan Note (Signed)
stage IIIB p(T3, N1aM0), MSS, KRAS/NRAS/BRAF wild type, lung and node metastasis in 04/2022  -diagnosed in 08/2021 -he completed 4 cycles adjuvant CAPOX 10/10/21 - 12/25/21 (though he somehow was still taking Xeloda through ~02/09/22 due to misunderstandings).  -he started FOLFIRI on 05/08/2022, Vectibix added from cycle 2, tolerating well overall

## 2022-07-09 NOTE — Patient Instructions (Signed)
[facility] ONCOLOGY  Discharge Instructions: Thank you for choosing [facility] to provide your oncology and hematology care.   If you have a lab appointment with the [facility], please go directly to the [facility] and check in at the registration area.   Wear comfortable clothing and clothing appropriate for easy access to any Portacath or PICC line.   We strive to give you quality time with your provider. You may need to reschedule your appointment if you arrive late (15 or more minutes).  Arriving late affects you and other patients whose appointments are after yours.  Also, if you miss three or more appointments without notifying the office, you may be dismissed from the clinic at the provider's discretion.      For prescription refill requests, have your pharmacy contact our office and allow 72 hours for refills to be completed.    Today you received the following chemotherapy and/or immunotherapy agents: Vectabix, Irinotecan, Leucovorin, Fluorouracil.      To help prevent nausea and vomiting after your treatment, we encourage you to take your nausea medication as directed.  BELOW ARE SYMPTOMS THAT SHOULD BE REPORTED IMMEDIATELY: *FEVER GREATER THAN 100.4 F (38 C) OR HIGHER *CHILLS OR SWEATING *NAUSEA AND VOMITING THAT IS NOT CONTROLLED WITH YOUR NAUSEA MEDICATION *UNUSUAL SHORTNESS OF BREATH *UNUSUAL BRUISING OR BLEEDING *URINARY PROBLEMS (pain or burning when urinating, or frequent urination) *BOWEL PROBLEMS (unusual diarrhea, constipation, pain near the anus) TENDERNESS IN MOUTH AND THROAT WITH OR WITHOUT PRESENCE OF ULCERS (sore throat, sores in mouth, or a toothache) UNUSUAL RASH, SWELLING OR PAIN  UNUSUAL VAGINAL DISCHARGE OR ITCHING   Items with * indicate a potential emergency and should be followed up as soon as possible or go to the Emergency Department if any problems should occur.  Please show the CHEMOTHERAPY ALERT CARD or  IMMUNOTHERAPY ALERT CARD at check-in to the Emergency Department and triage nurse.  Should you have questions after your visit or need to cancel or reschedule your appointment, please contact [facility]  Dept: [phone]  and follow the prompts.  Office hours are 8:00 a.m. to 4:30 p.m. Monday - Friday. Please note that voicemails left after 4:00 p.m. may not be returned until the following business day.  We are closed weekends and major holidays. You have access to a nurse at all times for urgent questions. Please call the main number to the clinic Dept: [phone] and follow the prompts.   For any non-urgent questions, you may also contact your provider using MyChart. We now offer e-Visits for anyone 33 and older to request care online for non-urgent symptoms. For details visit [email or web address].   Also download the MyChart app! Go to the app store, search "MyChart", open the app, select [facility], and log in with your MyChart username and password.  Masks are optional in the cancer centers. If you would like for your care team to wear a mask while they are taking care of you, please let them know. You may have one support person who is at least 56 years old accompany you for your appointments.

## 2022-07-09 NOTE — Assessment & Plan Note (Signed)
-  he was diagnosed with schizophrenia in his 20's, managed with medication, he is on disability. -he lives with his mother; he is able to do ADLs for himself, but not iADLS. They are both on disability. His mother also seems to have some kind of mental impairment.

## 2022-07-11 ENCOUNTER — Inpatient Hospital Stay: Payer: Medicaid Other

## 2022-07-11 VITALS — BP 100/75 | HR 100 | Temp 97.3°F | Resp 20

## 2022-07-11 DIAGNOSIS — Z5111 Encounter for antineoplastic chemotherapy: Secondary | ICD-10-CM | POA: Diagnosis not present

## 2022-07-11 DIAGNOSIS — C186 Malignant neoplasm of descending colon: Secondary | ICD-10-CM

## 2022-07-11 MED ORDER — HEPARIN SOD (PORK) LOCK FLUSH 100 UNIT/ML IV SOLN
500.0000 [IU] | Freq: Once | INTRAVENOUS | Status: AC | PRN
  Administered 2022-07-11: 500 [IU]

## 2022-07-11 MED ORDER — SODIUM CHLORIDE 0.9% FLUSH
10.0000 mL | INTRAVENOUS | Status: DC | PRN
  Administered 2022-07-11: 10 mL

## 2022-07-22 MED FILL — Dexamethasone Sodium Phosphate Inj 100 MG/10ML: INTRAMUSCULAR | Qty: 1 | Status: AC

## 2022-07-22 NOTE — Progress Notes (Unsigned)
[facility]   Telephone:(336) [phone] Fax:(336) [phone]   Clinic Follow up Note   Patient Care Team: Medicine, [facility] as PCP - General Clovis Riley, MD as Consulting Physician (General Surgery) Truitt Merle, MD as Consulting Physician (Oncology)  Date of Service:  07/23/2022  CHIEF COMPLAINT: f/u of metastatic colon cancer    CURRENT THERAPY:  FOLFIRI, q14d, starting 05/28/22 -Vectibix to be added with cycle 2  ASSESSMENT:  Matthew [name] is a 56 y.o. 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  -he started FOLFIRI on 05/08/2022, Vectibix added from cycle 2, tolerating well overal  Schizophrenia ([place]) -he was diagnosed with schizophrenia in his 20's, managed with medication, he is on disability. -he lives with his mother; he is able to do ADLs for himself, but not iADLS. They are both on disability. His mother also seems to have some kind of mental impairment.    PLAN: - lab reviewed  -proceed with C5 FOLFIRI+Vectibix today at same dose _0 -I asked pt and his mother to bring all his oral meds when he returns for pump DC, so our infusion nurse can go over how to take antiemetics. -lab,flush,f/u and FOLFIRI+Vectibix 08/05/2022    SUMMARY OF ONCOLOGIC HISTORY: Oncology History Overview Note   Cancer Staging  Cancer of left colon [facility]) Staging form: Colon and Rectum, AJCC 8th Edition - Pathologic stage from 08/08/2021: Stage IIIB (pT3, pN1a, cM0) - Signed by Truitt Merle, MD on 08/26/2021    Cancer of left colon ([place])  04/01/2020 Imaging   IMPRESSION: 1. Gallbladder decompressed bowel also partially calcified gallstones. No pericholecystic inflammation though if there is persisting clinical  concern for cholecystitis right upper quadrant ultrasound could be obtained. 2. Circumferential thickening of the distal thoracic esophagus. Could reflect features of esophagitis. Correlate with clinical symptoms and consider endoscopy as clinically warranted. 3. Additional segmental thickening of the mid to distal sigmoid with focal narrowing. No acute surrounding inflammation or resulting obstruction. Findings are nonspecific, and could reflect sequela of prior inflammation/infection. However, recommend correlation with colonoscopy if not recently performed. 4. Mild circumferential bladder wall thickening and indentation of the bladder base by an enlarged prostate. Possibly sequela of chronic outlet obstruction though could correlate with urinalysis to exclude cystitis. 5. Aortic Atherosclerosis (ICD10-I70.0).   08/06/2021 Imaging   IMPRESSION: Sigmoid colonic perforation with small free intraperitoneal gas and infiltration of the mesenteric and omental fat in keeping with changes of peritonitis.   Long segment inflammatory stranding of the sigmoid colon in keeping with a severe infectious or inflammatory colitis. This terminates an area of irregular mural thickening, infiltrative soft tissue within the colonic mesentery, and focal dystrophic calcification. This may represent a chronic inflammatory process, however, a perforated malignancy could appear similarly. There are 2 separate points of perforation which again raise the question of an underlying malignancy.   7.1 cm gas and fluid containing pericolonic abscess within the sigmoid mesentery.   Marked inflammatory change of the terminal ileum adjacent to the pericolonic abscess with resultant small bowel obstruction. Fluid within the distal esophagus likely relates to gastroesophageal reflux the setting of vomiting.   Aortic Atherosclerosis (ICD10-I70.0).   08/08/2021 Cancer Staging   Staging form: Colon and Rectum, AJCC 8th  Edition - Pathologic  stage from 08/08/2021: Stage IIIB (pT3, pN1a, cM0) - Signed by Truitt Merle, MD on 08/26/2021 Stage prefix: Initial diagnosis Total positive nodes: 1 Histologic grading system: 4 grade system Histologic grade (G): G2 Residual tumor (R): R0 - None   08/08/2021 Definitive Surgery   FINAL MICROSCOPIC DIAGNOSIS:   A. COLON, SIGMOID, PARTIAL COLECTOMY:  - Invasive moderately differentiated adenocarcinoma.  - Metastatic carcinoma involving one of twelve lymph nodes (1/12).  - See oncology table below.   ADDENDUM:  Mismatch Repair Protein (IHC)  SUMMARY INTERPRETATION: NORMAL    08/14/2021 Imaging   EXAM: CT ABDOMEN AND PELVIS WITH CONTRAST  IMPRESSION: 1. Post recent sigmoid colectomy with left lower quadrant colostomy. Two small residual foci of air within the pelvic mesentery with mild adjacent thickening, but no abscess or drainable collection. Trace non organized free fluid and stranding in the pelvis. 2. Short segment of small bowel wall thickening and inflammation in the pelvis involving the distal ileum, likely reactive. 3. Dilated distal esophagus, stomach, and small bowel, without discrete transition point, favoring postoperative ileus. 4. Small bilateral pleural effusions and compressive atelectasis. 5. Heterogeneous partially enhancing 14 mm lymph node in the retroperitoneum at the aortoiliac bifurcation, not significantly changed from prior exam. Suspected additional lymph nodes in the anterior common iliac space, not significantly changed from prior exam. Recommend attention at follow-up. 6. Additional chronic findings as described.     08/15/2021 Imaging   EXAM: CT CHEST WITH CONTRAST  IMPRESSION: 1. No evidence of thoracic metastasis. 2. Bilateral small layering pleural effusions with passive atelectasis   08/26/2021 Initial Diagnosis   Cancer of left colon ([place])   10/10/2021 - 12/12/2021 Chemotherapy   Patient is on Treatment Plan : COLORECTAL  Xelox ([place]) q21d     05/28/2022 - 05/28/2022 Chemotherapy   Patient is on Treatment Plan : COLORECTAL FOLFIRI + Bevacizumab q14d     05/28/2022 -  Chemotherapy   Patient is on Treatment Plan : COLORECTAL FOLFIRI + Panitumumab q14d        INTERVAL HISTORY:  Matthew [name] is here for a follow up of metastatic colon cancer    He was last seen by me on 07/09/22 He presents to the clinic accompanied by mother.Pt report he feels nausea all the time.Pt states he has vomit, and he has taken the nausea medicine but not sure which one. Pt mother states he has been eating. Pt denies having diarrhea some blood. No numbness and tingling    All other systems were reviewed with the patient and are negative.  MEDICAL HISTORY:  Past Medical History:  Diagnosis Date   Cancer ([place])    Schizophrenia [facility])     SURGICAL HISTORY: Past Surgical History:  Procedure Laterality Date   BRONCHIAL BIOPSY  05/11/2022   Procedure: BRONCHIAL BIOPSIES;  Surgeon: Collene Gobble, MD;  Location: [facility] ENDOSCOPY;  Service: Pulmonary;;   BRONCHIAL BRUSHINGS  05/11/2022   Procedure: BRONCHIAL BRUSHINGS;  Surgeon: Collene Gobble, MD;  Location: [facility] ENDOSCOPY;  Service: Pulmonary;;   BRONCHIAL NEEDLE ASPIRATION BIOPSY  05/11/2022   Procedure: BRONCHIAL NEEDLE ASPIRATION BIOPSIES;  Surgeon: Collene Gobble, MD;  Location: [facility];  Service: Pulmonary;;   BRONCHIAL WASHINGS  05/11/2022   Procedure: BRONCHIAL WASHINGS;  Surgeon: Collene Gobble, MD;  Location: [facility];  Service: Pulmonary;;   IR IMAGING GUIDED PORT INSERTION  05/25/2022   LAPAROTOMY N/A 08/08/2021   Procedure: EXPLORATORY LAPAROTOMY;  Surgeon: Clovis Riley, MD;  Location: [facility];  Service: General;  Laterality: N/A;   PARTIAL COLECTOMY N/A 08/08/2021   Procedure: PARTIAL COLECTOMY WITH COLOSTOMY;  Surgeon: Clovis Riley, MD;  Location: [facility];  Service: General;  Laterality: N/A;   VIDEO BRONCHOSCOPY WITH RADIAL ENDOBRONCHIAL ULTRASOUND   05/11/2022   Procedure: VIDEO BRONCHOSCOPY WITH RADIAL ENDOBRONCHIAL ULTRASOUND;  Surgeon: Collene Gobble, MD;  Location: [facility] ENDOSCOPY;  Service: Pulmonary;;    I have reviewed the social history and family history with the patient and they are unchanged from previous note.  ALLERGIES:  has No Known Allergies.  MEDICATIONS:  Current Outpatient Medications  Medication Sig Dispense Refill   acetaminophen (TYLENOL) 500 MG tablet Take 2 tablets (1,000 mg total) by mouth every 6 (six) hours as needed for mild pain or moderate pain.  0   aspirin EC 81 MG tablet Take 81 mg by mouth daily as needed (for pain or headaches).      benztropine (COGENTIN) 1 MG tablet Take 1 mg at bedtime by mouth.     clindamycin (CLINDAGEL) 1 % gel Apply topically 2 (two) times daily. To skin rash on face and upper body 30 g 0   ferrous sulfate 325 (65 FE) MG EC tablet Take 1 tablet (325 mg total) by mouth daily. (Patient not taking: Reported on 05/07/2022) 30 tablet 3   hydrocortisone cream 1 % Apply 1 Application topically 2 (two) times daily as needed for itching. For rash 30 g 2   Multiple Vitamin (MULTIVITAMIN WITH MINERALS) TABS tablet Take 1 tablet by mouth daily.     paliperidone (INVEGA SUSTENNA) 156 MG/ML SUSP injection Inject 156 mg every 30 (thirty) days into the muscle.     polyethylene glycol (MIRALAX / GLYCOLAX) 17 g packet Take 17 g by mouth daily as needed for mild constipation or moderate constipation. (Patient not taking: Reported on 05/07/2022)  0   No current facility-administered medications for this visit.   Facility-Administered Medications Ordered in Other Visits  Medication Dose Route Frequency Provider Last Rate Last Admin   atropine injection 0.5 mg  0.5 mg Intravenous Once PRN Truitt Merle, MD       fluorouracil (ADRUCIL) 4,350 mg in sodium chloride 0.9 % 63 mL chemo infusion  2,400 mg/m2 (Treatment Plan Recorded) Intravenous 1 day or 1 dose Truitt Merle, MD       irinotecan (CAMPTOSAR) 320 mg in  sodium chloride 0.9 % 500 mL chemo infusion  180 mg/m2 (Treatment Plan Recorded) Intravenous Once Truitt Merle, MD       leucovorin 728 mg in sodium chloride 0.9 % 250 mL infusion  400 mg/m2 (Treatment Plan Recorded) Intravenous Once Truitt Merle, MD       panitumumab (VECTIBIX) 400 mg in sodium chloride 0.9 % 100 mL chemo infusion  6 mg/kg (Treatment Plan Recorded) Intravenous Once Truitt Merle, MD 240 mL/hr at 07/23/22 1059 400 mg at 07/23/22 1059   prochlorperazine (COMPAZINE) injection 10 mg  10 mg Intravenous Once Truitt Merle, MD       sodium chloride flush (NS) 0.9 % injection 10 mL  10 mL Intracatheter PRN Truitt Merle, MD        PHYSICAL EXAMINATION: ECOG PERFORMANCE STATUS: 2 - Symptomatic, <50% confined to bed  Vitals:   07/23/22 0928  BP: 107/79  Pulse: 89  Resp: 18  Temp: 98.4 F (36.9 C)  SpO2: 100%   Wt Readings from Last 3 Encounters:  07/23/22 146 lb 11.2 oz (66.5 kg)  07/09/22 147 lb 3.2 oz (66.8 kg)  06/24/22 144 lb  8 oz (65.5 kg)    GENERAL:alert, no distress and comfortable SKIN: skin color, texture, turgor are normal, no rashes or significant lesions EYES: normal, Conjunctiva are pink and non-injected, sclera clear NECK: supple, thyroid normal size, non-tender, without nodularity LYMPH:  no palpable lymphadenopathy in the cervical, axillary  LUNGS: clear to auscultation and percussion with normal breathing effort HEART: regular rate & rhythm and no murmurs and no lower extremity edema ABDOMEN(-) :abdomen soft, non-tender and normal bowel sounds, Colostomy bag ,no leakage. Covered by tape. Musculoskeletal:no cyanosis of digits and no clubbing  NEURO: alert & oriented x 3 with fluent speech, no focal motor/sensory deficits  LABORATORY DATA:  I have reviewed the data as listed    Latest Ref Rng & Units 07/23/2022    9:01 AM 07/09/2022    8:52 AM 06/24/2022   10:46 AM  CBC  WBC 4.0 - 10.5 K/uL 4.7  3.5  5.3   Hemoglobin 13.0 - 17.0 g/dL 12.5  12.5  12.6   Hematocrit  39.0 - 52.0 % 36.4  36.7  36.4   Platelets 150 - 400 K/uL 333  350  320         Latest Ref Rng & Units 07/23/2022    9:01 AM 07/09/2022    8:52 AM 06/24/2022   10:46 AM  CMP  Glucose 70 - 99 mg/dL 107  108  104   BUN 6 - 20 mg/dL _0 Creatinine 0.61 - 1.24 mg/dL 0.75  0.66  0.79   Sodium 135 - 145 mmol/L 141  139  138   Potassium 3.5 - 5.1 mmol/L 3.7  4.1  3.6   Chloride 98 - 111 mmol/L 108  106  108   CO2 22 - 32 mmol/L _1 Calcium 8.9 - 10.3 mg/dL 9.7  9.8  9.3   Total Protein 6.5 - 8.1 g/dL 7.2  7.1  7.8   Total Bilirubin 0.3 - 1.2 mg/dL 0.9  0.7  0.9   Alkaline Phos 38 - 126 U/L 158  183  150   AST 15 - 41 U/L 42  65  34   ALT 0 - 44 U/L 56  98  49       RADIOGRAPHIC STUDIES: I have personally reviewed the radiological images as listed and agreed with the findings in the report. No results found.    Orders Placed This Encounter  Procedures   CBC with Differential ([facility] Only)    Standing Status:   Future    Standing Expiration Date:   08/20/2023   CMP ([facility] only)    Standing Status:   Future    Standing Expiration Date:   08/20/2023   Magnesium    Standing Status:   Future    Standing Expiration Date:   08/20/2023   CBC with Differential (Cancer Center Only)    Standing Status:   Future    Standing Expiration Date:   09/03/2023   CMP ([facility] only)    Standing Status:   Future    Standing Expiration Date:   09/03/2023   Magnesium    Standing Status:   Future    Standing Expiration Date:   09/03/2023   All questions were answered. The patient knows to call the clinic with any problems, questions or concerns. No barriers to learning was detected. The total time spent in the appointment was 30 minutes.     Truitt Merle,  MD 07/23/2022   Felicity Coyer, CMA, am acting as scribe for Truitt Merle, MD.   I have reviewed the above documentation for accuracy and completeness, and I agree with the above.

## 2022-07-22 NOTE — Assessment & Plan Note (Signed)
stage IIIB p(T3, N1aM0), MSS, KRAS/NRAS/BRAF wild type, lung and node metastasis in 04/2022  -diagnosed in 08/2021 -he completed 4 cycles adjuvant CAPOX 10/10/21 - 12/25/21 (though he somehow was still taking Xeloda through ~02/09/22 due to misunderstandings).  -he started FOLFIRI on 05/08/2022, Vectibix added from cycle 2, tolerating well overal

## 2022-07-22 NOTE — Assessment & Plan Note (Signed)
-  he was diagnosed with schizophrenia in his 20's, managed with medication, he is on disability. -he lives with his mother; he is able to do ADLs for himself, but not iADLS. They are both on disability. His mother also seems to have some kind of mental impairment.

## 2022-07-23 ENCOUNTER — Encounter: Payer: Self-pay | Admitting: Hematology

## 2022-07-23 ENCOUNTER — Other Ambulatory Visit: Payer: Self-pay

## 2022-07-23 ENCOUNTER — Inpatient Hospital Stay: Payer: Medicaid Other

## 2022-07-23 ENCOUNTER — Inpatient Hospital Stay (HOSPITAL_BASED_OUTPATIENT_CLINIC_OR_DEPARTMENT_OTHER): Payer: Medicaid Other | Admitting: Hematology

## 2022-07-23 VITALS — BP 107/79 | HR 89 | Temp 98.4°F | Resp 18 | Ht 69.0 in | Wt 146.7 lb

## 2022-07-23 DIAGNOSIS — Z95828 Presence of other vascular implants and grafts: Secondary | ICD-10-CM

## 2022-07-23 DIAGNOSIS — F209 Schizophrenia, unspecified: Secondary | ICD-10-CM

## 2022-07-23 DIAGNOSIS — C186 Malignant neoplasm of descending colon: Secondary | ICD-10-CM

## 2022-07-23 DIAGNOSIS — C189 Malignant neoplasm of colon, unspecified: Secondary | ICD-10-CM

## 2022-07-23 DIAGNOSIS — Z5111 Encounter for antineoplastic chemotherapy: Secondary | ICD-10-CM | POA: Diagnosis not present

## 2022-07-23 LAB — CBC WITH DIFFERENTIAL (CANCER CENTER ONLY)
Abs Immature Granulocytes: 0.01 10*3/uL (ref 0.00–0.07)
Basophils Absolute: 0 10*3/uL (ref 0.0–0.1)
Basophils Relative: 1 %
Eosinophils Absolute: 0.1 10*3/uL (ref 0.0–0.5)
Eosinophils Relative: 3 %
HCT: 36.4 % — ABNORMAL LOW (ref 39.0–52.0)
Hemoglobin: 12.5 g/dL — ABNORMAL LOW (ref 13.0–17.0)
Immature Granulocytes: 0 %
Lymphocytes Relative: 19 %
Lymphs Abs: 0.9 10*3/uL (ref 0.7–4.0)
MCH: 30 pg (ref 26.0–34.0)
MCHC: 34.3 g/dL (ref 30.0–36.0)
MCV: 87.5 fL (ref 80.0–100.0)
Monocytes Absolute: 0.3 10*3/uL (ref 0.1–1.0)
Monocytes Relative: 6 %
Neutro Abs: 3.3 10*3/uL (ref 1.7–7.7)
Neutrophils Relative %: 71 %
Platelet Count: 333 10*3/uL (ref 150–400)
RBC: 4.16 MIL/uL — ABNORMAL LOW (ref 4.22–5.81)
RDW: 14.3 % (ref 11.5–15.5)
WBC Count: 4.7 10*3/uL (ref 4.0–10.5)
nRBC: 0 % (ref 0.0–0.2)

## 2022-07-23 LAB — CMP (CANCER CENTER ONLY)
ALT: 56 U/L — ABNORMAL HIGH (ref 0–44)
AST: 42 U/L — ABNORMAL HIGH (ref 15–41)
Albumin: 4 g/dL (ref 3.5–5.0)
Alkaline Phosphatase: 158 U/L — ABNORMAL HIGH (ref 38–126)
Anion gap: 7 (ref 5–15)
BUN: 9 mg/dL (ref 6–20)
CO2: 26 mmol/L (ref 22–32)
Calcium: 9.7 mg/dL (ref 8.9–10.3)
Chloride: 108 mmol/L (ref 98–111)
Creatinine: 0.75 mg/dL (ref 0.61–1.24)
GFR, Estimated: >60 mL/min (ref >60)
Glucose, Bld: 107 mg/dL — ABNORMAL HIGH (ref 70–99)
Potassium: 3.7 mmol/L (ref 3.5–5.1)
Sodium: 141 mmol/L (ref 135–145)
Total Bilirubin: 0.9 mg/dL (ref 0.3–1.2)
Total Protein: 7.2 g/dL (ref 6.5–8.1)

## 2022-07-23 LAB — MAGNESIUM: Magnesium: 2 mg/dL (ref 1.7–2.4)

## 2022-07-23 MED ORDER — SODIUM CHLORIDE 0.9% FLUSH
10.0000 mL | Freq: Once | INTRAVENOUS | Status: AC
  Administered 2022-07-23: 10 mL

## 2022-07-23 MED ORDER — SODIUM CHLORIDE 0.9 % IV SOLN
10.0000 mg | Freq: Once | INTRAVENOUS | Status: AC
  Administered 2022-07-23: 10 mg via INTRAVENOUS
  Filled 2022-07-23: qty 10

## 2022-07-23 MED ORDER — SODIUM CHLORIDE 0.9 % IV SOLN
2400.0000 mg/m2 | INTRAVENOUS | Status: DC
  Administered 2022-07-23: 4350 mg via INTRAVENOUS
  Filled 2022-07-23: qty 87

## 2022-07-23 MED ORDER — PROCHLORPERAZINE EDISYLATE 10 MG/2ML IJ SOLN
10.0000 mg | Freq: Once | INTRAMUSCULAR | Status: AC
  Administered 2022-07-23: 10 mg via INTRAVENOUS
  Filled 2022-07-23: qty 2

## 2022-07-23 MED ORDER — SODIUM CHLORIDE 0.9 % IV SOLN
180.0000 mg/m2 | Freq: Once | INTRAVENOUS | Status: AC
  Administered 2022-07-23: 320 mg via INTRAVENOUS
  Filled 2022-07-23: qty 15

## 2022-07-23 MED ORDER — ATROPINE SULFATE 1 MG/ML IV SOLN
0.5000 mg | Freq: Once | INTRAVENOUS | Status: AC | PRN
  Administered 2022-07-23: 0.5 mg via INTRAVENOUS
  Filled 2022-07-23: qty 1

## 2022-07-23 MED ORDER — SODIUM CHLORIDE 0.9 % IV SOLN
6.0000 mg/kg | Freq: Once | INTRAVENOUS | Status: AC
  Administered 2022-07-23: 400 mg via INTRAVENOUS
  Filled 2022-07-23: qty 20

## 2022-07-23 MED ORDER — PALONOSETRON HCL INJECTION 0.25 MG/5ML
0.2500 mg | Freq: Once | INTRAVENOUS | Status: AC
  Administered 2022-07-23: 0.25 mg via INTRAVENOUS
  Filled 2022-07-23: qty 5

## 2022-07-23 MED ORDER — SODIUM CHLORIDE 0.9% FLUSH
10.0000 mL | INTRAVENOUS | Status: DC | PRN
  Administered 2022-07-23: 10 mL

## 2022-07-23 MED ORDER — SODIUM CHLORIDE 0.9 % IV SOLN
400.0000 mg/m2 | Freq: Once | INTRAVENOUS | Status: AC
  Administered 2022-07-23: 728 mg via INTRAVENOUS
  Filled 2022-07-23: qty 36.4

## 2022-07-23 MED ORDER — SODIUM CHLORIDE 0.9 % IV SOLN: Freq: Once | INTRAVENOUS | Status: AC

## 2022-07-23 NOTE — Patient Instructions (Signed)
[facility] ONCOLOGY  Discharge Instructions: Thank you for choosing [facility] to provide your oncology and hematology care.   If you have a lab appointment with the [facility], please go directly to the [facility] and check in at the registration area.   Wear comfortable clothing and clothing appropriate for easy access to any Portacath or PICC line.   We strive to give you quality time with your provider. You may need to reschedule your appointment if you arrive late (15 or more minutes).  Arriving late affects you and other patients whose appointments are after yours.  Also, if you miss three or more appointments without notifying the office, you may be dismissed from the clinic at the provider's discretion.      For prescription refill requests, have your pharmacy contact our office and allow 72 hours for refills to be completed.    Today you received the following chemotherapy and/or immunotherapy agents Vectibix, Irinotecan, Leucovorin, 5FU pump      To help prevent nausea and vomiting after your treatment, we encourage you to take your nausea medication as directed.  BELOW ARE SYMPTOMS THAT SHOULD BE REPORTED IMMEDIATELY: *FEVER GREATER THAN 100.4 F (38 C) OR HIGHER *CHILLS OR SWEATING *NAUSEA AND VOMITING THAT IS NOT CONTROLLED WITH YOUR NAUSEA MEDICATION *UNUSUAL SHORTNESS OF BREATH *UNUSUAL BRUISING OR BLEEDING *URINARY PROBLEMS (pain or burning when urinating, or frequent urination) *BOWEL PROBLEMS (unusual diarrhea, constipation, pain near the anus) TENDERNESS IN MOUTH AND THROAT WITH OR WITHOUT PRESENCE OF ULCERS (sore throat, sores in mouth, or a toothache) UNUSUAL RASH, SWELLING OR PAIN  UNUSUAL VAGINAL DISCHARGE OR ITCHING   Items with * indicate a potential emergency and should be followed up as soon as possible or go to the Emergency Department if any problems should occur.  Please show the CHEMOTHERAPY ALERT CARD or  IMMUNOTHERAPY ALERT CARD at check-in to the Emergency Department and triage nurse.  Should you have questions after your visit or need to cancel or reschedule your appointment, please contact [facility]  Dept: [phone]  and follow the prompts.  Office hours are 8:00 a.m. to 4:30 p.m. Monday - Friday. Please note that voicemails left after 4:00 p.m. may not be returned until the following business day.  We are closed weekends and major holidays. You have access to a nurse at all times for urgent questions. Please call the main number to the clinic Dept: [phone] and follow the prompts.   For any non-urgent questions, you may also contact your provider using MyChart. We now offer e-Visits for anyone 58 and older to request care online for non-urgent symptoms. For details visit [email or web address].   Also download the MyChart app! Go to the app store, search "MyChart", open the app, select [facility], and log in with your MyChart username and password.  Masks are optional in the cancer centers. If you would like for your care team to wear a mask while they are taking care of you, please let them know. You may have one support person who is at least 56 years old accompany you for your appointments.

## 2022-07-24 ENCOUNTER — Other Ambulatory Visit: Payer: Self-pay

## 2022-07-25 ENCOUNTER — Inpatient Hospital Stay: Payer: Medicaid Other

## 2022-07-25 VITALS — BP 93/66 | HR 120 | Temp 98.1°F | Resp 24

## 2022-07-25 DIAGNOSIS — Z5111 Encounter for antineoplastic chemotherapy: Secondary | ICD-10-CM | POA: Diagnosis not present

## 2022-07-25 DIAGNOSIS — C186 Malignant neoplasm of descending colon: Secondary | ICD-10-CM

## 2022-07-25 MED ORDER — HEPARIN SOD (PORK) LOCK FLUSH 100 UNIT/ML IV SOLN
500.0000 [IU] | Freq: Once | INTRAVENOUS | Status: AC | PRN
  Administered 2022-07-25: 500 [IU]

## 2022-07-25 MED ORDER — SODIUM CHLORIDE 0.9% FLUSH
10.0000 mL | INTRAVENOUS | Status: DC | PRN
  Administered 2022-07-25: 10 mL

## 2022-07-31 ENCOUNTER — Ambulatory Visit (HOSPITAL_COMMUNITY)
Admission: RE | Admit: 2022-07-31 | Discharge: 2022-07-31 | Disposition: A | Discharge status: Home / Self Care | Payer: Medicaid Other | Source: Ambulatory Visit | Attending: Hematology | Admitting: Hematology

## 2022-07-31 DIAGNOSIS — C186 Malignant neoplasm of descending colon: Secondary | ICD-10-CM | POA: Insufficient documentation

## 2022-07-31 MED ORDER — IOHEXOL 9 MG/ML PO SOLN
ORAL | Status: AC
  Filled 2022-07-31: qty 1000

## 2022-07-31 MED ORDER — IOHEXOL 300 MG/ML  SOLN
100.0000 mL | Freq: Once | INTRAMUSCULAR | Status: AC | PRN
  Administered 2022-07-31: 100 mL via INTRAVENOUS

## 2022-08-04 MED FILL — Dexamethasone Sodium Phosphate Inj 100 MG/10ML: INTRAMUSCULAR | Qty: 1 | Status: AC

## 2022-08-04 NOTE — Assessment & Plan Note (Signed)
-  he was diagnosed with schizophrenia in his 20's, managed with medication, he is on disability. -he lives with his mother; he is able to do ADLs for himself, but not iADLS. They are both on disability. His mother also seems to have some kind of mental impairment.

## 2022-08-04 NOTE — Assessment & Plan Note (Signed)
stage IIIB p(T3, N1aM0), MSS, KRAS/NRAS/BRAF wild type, lung and node metastasis in 04/2022  -diagnosed in 08/2021 -he completed 4 cycles adjuvant CAPOX 10/10/21 - 12/25/21 (though he somehow was still taking Xeloda through ~02/09/22 due to misunderstandings).  -he started FOLFIRI on 05/08/2022, Vectibix added from cycle 2, tolerating well overall -I personally reviewed with the restaging CT scan image from August 02, 2022, which showed good partial response in pulmonary metastasis, no other new lesions. -Will continue current treatment.

## 2022-08-04 NOTE — Progress Notes (Unsigned)
[place]   [phone] [phone]   Clinic Follow up Note   Patient Care Team: Medicine, [facility] as PCP - General Matthew Riley, MD as Consulting Physician (General Surgery) Matthew Merle, MD as Consulting Physician (Oncology)  Date of Service:  08/05/2022  CHIEF COMPLAINT: f/u of metastatic colon cancer     CURRENT THERAPY:  FOLFIRI, q14d, starting 05/28/22 -Vectibix to be added with cycle 2 ASSESSMENT:  Matthew [name] is a 57 y.o. 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  -he started FOLFIRI on 05/08/2022, Vectibix added from cycle 2, tolerating well overall -I personally reviewed with the restaging CT scan image from August 02, 2022, which showed good partial response in pulmonary metastasis, no other new lesions. -Will continue current treatment.  Schizophrenia ([place]) -he was diagnosed with schizophrenia in his 20's, managed with medication, he is on disability. -he lives with his mother; he is able to do ADLs for himself, but not iADLS. They are both on disability. His mother also seems to have some kind of mental impairment.     PLAN: - Discuss Ct Scans- he has had good partial response  -Pt is tolerating chemo well, skin rash has improved. - Proceed with C6  FOLFIRI+Vectibix today at same dose  -lab,f/u ,treatment 08/19/22  SUMMARY OF ONCOLOGIC HISTORY: Oncology History Overview Note   Cancer Staging  Cancer of left colon [facility]) Staging form: Colon and Rectum, AJCC 8th Edition - Pathologic stage from 08/08/2021: Stage IIIB (pT3, pN1a, cM0) - Signed by Matthew Merle, MD on 08/26/2021    Cancer of left colon ([place])  04/01/2020 Imaging   IMPRESSION: 1. Gallbladder  decompressed bowel also partially calcified gallstones. No pericholecystic inflammation though if there is persisting clinical concern for cholecystitis right upper quadrant ultrasound could be obtained. 2. Circumferential thickening of the distal thoracic esophagus. Could reflect features of esophagitis. Correlate with clinical symptoms and consider endoscopy as clinically warranted. 3. Additional segmental thickening of the mid to distal sigmoid with focal narrowing. No acute surrounding inflammation or resulting obstruction. Findings are nonspecific, and could reflect sequela of prior inflammation/infection. However, recommend correlation with colonoscopy if not recently performed. 4. Mild circumferential bladder wall thickening and indentation of the bladder base by an enlarged prostate. Possibly sequela of chronic outlet obstruction though could correlate with urinalysis to exclude cystitis. 5. Aortic Atherosclerosis (ICD10-I70.0).   08/06/2021 Imaging   IMPRESSION: Sigmoid colonic perforation with small free intraperitoneal gas and infiltration of the mesenteric and omental fat in keeping with changes of peritonitis.   Long segment inflammatory stranding of the sigmoid colon in keeping with a severe infectious or inflammatory colitis. This terminates an area of irregular mural thickening, infiltrative soft tissue within the colonic mesentery, and focal dystrophic calcification. This may represent a chronic inflammatory process, however, a perforated malignancy could appear similarly. There are 2 separate points of perforation which again raise the question of an underlying malignancy.   7.1 cm gas and fluid containing pericolonic abscess within the sigmoid mesentery.   Marked inflammatory change of the terminal ileum adjacent to the pericolonic abscess with resultant small bowel obstruction. Fluid within the distal esophagus likely relates to gastroesophageal reflux the setting of  vomiting.   Aortic Atherosclerosis (ICD10-I70.0).  08/08/2021 Cancer Staging   Staging form: Colon and Rectum, AJCC 8th Edition - Pathologic stage from 08/08/2021: Stage IIIB (pT3, pN1a, cM0) - Signed by Matthew Merle, MD on 08/26/2021 Stage prefix: Initial diagnosis Total positive nodes: 1 Histologic grading system: 4 grade system Histologic grade (G): G2 Residual tumor (R): R0 - None   08/08/2021 Definitive Surgery   FINAL MICROSCOPIC DIAGNOSIS:   A. COLON, SIGMOID, PARTIAL COLECTOMY:  - Invasive moderately differentiated adenocarcinoma.  - Metastatic carcinoma involving one of twelve lymph nodes (1/12).  - See oncology table below.   ADDENDUM:  Mismatch Repair Protein (I[facility])  SUMMARY INTERPRETATION: NORMAL    08/14/2021 Imaging   EXAM: CT ABDOMEN AND PELVIS WITH CONTRAST  IMPRESSION: 1. Post recent sigmoid colectomy with left lower quadrant colostomy. Two small residual foci of air within the pelvic mesentery with mild adjacent thickening, but no abscess or drainable collection. Trace non organized free fluid and stranding in the pelvis. 2. Short segment of small bowel wall thickening and inflammation in the pelvis involving the distal ileum, likely reactive. 3. Dilated distal esophagus, stomach, and small bowel, without discrete transition point, favoring postoperative ileus. 4. Small bilateral pleural effusions and compressive atelectasis. 5. Heterogeneous partially enhancing 14 mm lymph node in the retroperitoneum at the aortoiliac bifurcation, not significantly changed from prior exam. Suspected additional lymph nodes in the anterior common iliac space, not significantly changed from prior exam. Recommend attention at follow-up. 6. Additional chronic findings as described.     08/15/2021 Imaging   EXAM: CT CHEST WITH CONTRAST  IMPRESSION: 1. No evidence of thoracic metastasis. 2. Bilateral small layering pleural effusions with passive atelectasis   08/26/2021 Initial  Diagnosis   Cancer of left colon ([place])   10/10/2021 - 12/12/2021 Chemotherapy   Patient is on Treatment Plan : COLORECTAL Xelox ([place]) q21d     05/28/2022 - 05/28/2022 Chemotherapy   Patient is on Treatment Plan : COLORECTAL FOLFIRI + Bevacizumab q14d     05/28/2022 -  Chemotherapy   Patient is on Treatment Plan : COLORECTAL FOLFIRI + Panitumumab q14d        INTERVAL HISTORY:  Matthew [name] is here for a follow up of  metastatic colon cancer   He was last seen by me on 05/28/22 He presents to the clinic family member. Pt states he had nausea after chemo.Pt states of having chest pain and it lasted for a couple of days. He said he has hiccup and the pain was related to the hiccups. Pt denies having diarrhea.     All other systems were reviewed with the patient and are negative.  MEDICAL HISTORY:  Past Medical History:  Diagnosis Date   Cancer ([place])    Schizophrenia [facility])     SURGICAL HISTORY: Past Surgical History:  Procedure Laterality Date   BRONCHIAL BIOPSY  05/11/2022   Procedure: BRONCHIAL BIOPSIES;  Surgeon: Collene Gobble, MD;  Location: [facility] ENDOSCOPY;  Service: Pulmonary;;   BRONCHIAL BRUSHINGS  05/11/2022   Procedure: BRONCHIAL BRUSHINGS;  Surgeon: Collene Gobble, MD;  Location: [facility] ENDOSCOPY;  Service: Pulmonary;;   BRONCHIAL NEEDLE ASPIRATION BIOPSY  05/11/2022   Procedure: BRONCHIAL NEEDLE ASPIRATION BIOPSIES;  Surgeon: Collene Gobble, MD;  Location: [place];  Service: Pulmonary;;   BRONCHIAL WASHINGS  05/11/2022   Procedure: BRONCHIAL WASHINGS;  Surgeon: Collene Gobble, MD;  Location: [place] ENDOSCOPY;  Service: Pulmonary;;   IR IMAGING GUIDED PORT INSERTION  05/25/2022   LAPAROTOMY N/A 08/08/2021   Procedure: EXPLORATORY LAPAROTOMY;  Surgeon:  Matthew Riley, MD;  Location: [place];  Service: General;  Laterality: N/A;   PARTIAL COLECTOMY N/A 08/08/2021   Procedure: PARTIAL COLECTOMY WITH COLOSTOMY;  Surgeon: Matthew Riley, MD;  Location: [place];  Service:  General;  Laterality: N/A;   VIDEO BRONCHOSCOPY WITH RADIAL ENDOBRONCHIAL ULTRASOUND  05/11/2022   Procedure: VIDEO BRONCHOSCOPY WITH RADIAL ENDOBRONCHIAL ULTRASOUND;  Surgeon: Collene Gobble, MD;  Location: [place] ENDOSCOPY;  Service: Pulmonary;;    I have reviewed the social history and family history with the patient and they are unchanged from previous note.  ALLERGIES:  has No Known Allergies.  MEDICATIONS:  Current Outpatient Medications  Medication Sig Dispense Refill   acetaminophen (TYLENOL) 500 MG tablet Take 2 tablets (1,000 mg total) by mouth every 6 (six) hours as needed for mild pain or moderate pain.  0   aspirin EC 81 MG tablet Take 81 mg by mouth daily as needed (for pain or headaches).      benztropine (COGENTIN) 1 MG tablet Take 1 mg at bedtime by mouth.     clindamycin (CLINDAGEL) 1 % gel Apply topically 2 (two) times daily. To skin rash on face and upper body 30 g 0   ferrous sulfate 325 (65 FE) MG EC tablet Take 1 tablet (325 mg total) by mouth daily. (Patient not taking: Reported on 05/07/2022) 30 tablet 3   hydrocortisone cream 1 % Apply 1 Application topically 2 (two) times daily as needed for itching. For rash 30 g 2   Multiple Vitamin (MULTIVITAMIN WITH MINERALS) TABS tablet Take 1 tablet by mouth daily.     paliperidone (INVEGA SUSTENNA) 156 MG/ML SUSP injection Inject 156 mg every 30 (thirty) days into the muscle.     polyethylene glycol (MIRALAX / GLYCOLAX) 17 g packet Take 17 g by mouth daily as needed for mild constipation or moderate constipation. (Patient not taking: Reported on 05/07/2022)  0   No current facility-administered medications for this visit.   Facility-Administered Medications Ordered in Other Visits  Medication Dose Route Frequency Provider Last Rate Last Admin   fluorouracil (ADRUCIL) 4,350 mg in sodium chloride 0.9 % 63 mL chemo infusion  2,400 mg/m2 (Treatment Plan Recorded) Intravenous 1 day or 1 dose Matthew Merle, MD   Infusion Verify at 08/05/22  1412    PHYSICAL EXAMINATION: ECOG PERFORMANCE STATUS: 1 - Symptomatic but completely ambulatory  Vitals:   08/05/22 0958  BP: 123/82  Pulse: (!) 102  Resp: 18  Temp: 97.6 F (36.4 C)  SpO2: 100%   Wt Readings from Last 3 Encounters:  08/05/22 148 lb 12.8 oz (67.5 kg)  07/23/22 146 lb 11.2 oz (66.5 kg)  07/09/22 147 lb 3.2 oz (66.8 kg)     GENERAL:alert, no distress and comfortable SKIN: skin color normal, no rashes or significant lesions EYES: normal, Conjunctiva are pink and non-injected, sclera clear  NEURO: alert & oriented x 3 with fluent speech   LABORATORY DATA:  I have reviewed the data as listed    Latest Ref Rng & Units 08/05/2022    9:19 AM 07/23/2022    9:01 AM 07/09/2022    8:52 AM  CBC  WBC 4.0 - 10.5 K/uL 4.6  4.7  3.5   Hemoglobin 13.0 - 17.0 g/dL 12.2  12.5  12.5   Hematocrit 39.0 - 52.0 % 35.8  36.4  36.7   Platelets 150 - 400 K/uL 321  333  350         Latest Ref Rng &  Units 08/05/2022    9:19 AM 07/23/2022    9:01 AM 07/09/2022    8:52 AM  CMP  Glucose 70 - 99 mg/dL 95  107  108   BUN 6 - 20 mg/dL _0 Creatinine 0.61 - 1.24 mg/dL 0.68  0.75  0.66   Sodium 135 - 145 mmol/L 136  141  139   Potassium 3.5 - 5.1 mmol/L 3.4  3.7  4.1   Chloride 98 - 111 mmol/L 101  108  106   CO2 22 - 32 mmol/L _1 Calcium 8.9 - 10.3 mg/dL 9.2  9.7  9.8   Total Protein 6.5 - 8.1 g/dL 6.4  7.2  7.1   Total Bilirubin 0.3 - 1.2 mg/dL 0.7  0.9  0.7   Alkaline Phos 38 - 126 U/L 171  158  183   AST 15 - 41 U/L 36  42  65   ALT 0 - 44 U/L 79  56  98       RADIOGRAPHIC STUDIES: I have personally reviewed the radiological images as listed and agreed with the findings in the report. No results found.    No orders of the defined types were placed in this encounter.  All questions were answered. The patient knows to call the clinic with any problems, questions or concerns. No barriers to learning was detected. The total time spent in the appointment  was 30 minutes.     Matthew Merle, MD 08/05/2022   Felicity Coyer, CMA, am acting as scribe for Matthew Merle, MD.   I have reviewed the above documentation for accuracy and completeness, and I agree with the above.

## 2022-08-05 ENCOUNTER — Encounter: Payer: Self-pay | Admitting: Hematology

## 2022-08-05 ENCOUNTER — Inpatient Hospital Stay: Payer: Medicaid Other | Attending: Hematology

## 2022-08-05 ENCOUNTER — Inpatient Hospital Stay: Payer: Medicaid Other

## 2022-08-05 ENCOUNTER — Inpatient Hospital Stay (HOSPITAL_BASED_OUTPATIENT_CLINIC_OR_DEPARTMENT_OTHER): Payer: Medicaid Other | Admitting: Hematology

## 2022-08-05 VITALS — BP 123/82 | HR 102 | Temp 97.6°F | Resp 18 | Ht 69.0 in | Wt 148.8 lb

## 2022-08-05 DIAGNOSIS — C772 Secondary and unspecified malignant neoplasm of intra-abdominal lymph nodes: Secondary | ICD-10-CM | POA: Diagnosis not present

## 2022-08-05 DIAGNOSIS — C7800 Secondary malignant neoplasm of unspecified lung: Secondary | ICD-10-CM | POA: Diagnosis not present

## 2022-08-05 DIAGNOSIS — Z5111 Encounter for antineoplastic chemotherapy: Secondary | ICD-10-CM | POA: Insufficient documentation

## 2022-08-05 DIAGNOSIS — Z95828 Presence of other vascular implants and grafts: Secondary | ICD-10-CM

## 2022-08-05 DIAGNOSIS — Z5112 Encounter for antineoplastic immunotherapy: Secondary | ICD-10-CM | POA: Diagnosis present

## 2022-08-05 DIAGNOSIS — Z79899 Other long term (current) drug therapy: Secondary | ICD-10-CM | POA: Diagnosis not present

## 2022-08-05 DIAGNOSIS — C186 Malignant neoplasm of descending colon: Secondary | ICD-10-CM

## 2022-08-05 DIAGNOSIS — C189 Malignant neoplasm of colon, unspecified: Secondary | ICD-10-CM

## 2022-08-05 DIAGNOSIS — F209 Schizophrenia, unspecified: Secondary | ICD-10-CM

## 2022-08-05 LAB — CMP (CANCER CENTER ONLY)
ALT: 79 U/L — ABNORMAL HIGH (ref 0–44)
AST: 36 U/L (ref 15–41)
Albumin: 3.9 g/dL (ref 3.5–5.0)
Alkaline Phosphatase: 171 U/L — ABNORMAL HIGH (ref 38–126)
Anion gap: 7 (ref 5–15)
BUN: 8 mg/dL (ref 6–20)
CO2: 28 mmol/L (ref 22–32)
Calcium: 9.2 mg/dL (ref 8.9–10.3)
Chloride: 101 mmol/L (ref 98–111)
Creatinine: 0.68 mg/dL (ref 0.61–1.24)
GFR, Estimated: >60 mL/min (ref >60)
Glucose, Bld: 95 mg/dL (ref 70–99)
Potassium: 3.4 mmol/L — ABNORMAL LOW (ref 3.5–5.1)
Sodium: 136 mmol/L (ref 135–145)
Total Bilirubin: 0.7 mg/dL (ref 0.3–1.2)
Total Protein: 6.4 g/dL — ABNORMAL LOW (ref 6.5–8.1)

## 2022-08-05 LAB — CBC WITH DIFFERENTIAL (CANCER CENTER ONLY)
Abs Immature Granulocytes: 0.01 10*3/uL (ref 0.00–0.07)
Basophils Absolute: 0 10*3/uL (ref 0.0–0.1)
Basophils Relative: 1 %
Eosinophils Absolute: 0.1 10*3/uL (ref 0.0–0.5)
Eosinophils Relative: 3 %
HCT: 35.8 % — ABNORMAL LOW (ref 39.0–52.0)
Hemoglobin: 12.2 g/dL — ABNORMAL LOW (ref 13.0–17.0)
Immature Granulocytes: 0 %
Lymphocytes Relative: 25 %
Lymphs Abs: 1.2 10*3/uL (ref 0.7–4.0)
MCH: 29.8 pg (ref 26.0–34.0)
MCHC: 34.1 g/dL (ref 30.0–36.0)
MCV: 87.5 fL (ref 80.0–100.0)
Monocytes Absolute: 0.4 10*3/uL (ref 0.1–1.0)
Monocytes Relative: 8 %
Neutro Abs: 2.9 10*3/uL (ref 1.7–7.7)
Neutrophils Relative %: 63 %
Platelet Count: 321 10*3/uL (ref 150–400)
RBC: 4.09 MIL/uL — ABNORMAL LOW (ref 4.22–5.81)
RDW: 14.6 % (ref 11.5–15.5)
WBC Count: 4.6 10*3/uL (ref 4.0–10.5)
nRBC: 0 % (ref 0.0–0.2)

## 2022-08-05 LAB — MAGNESIUM: Magnesium: 1.9 mg/dL (ref 1.7–2.4)

## 2022-08-05 MED ORDER — SODIUM CHLORIDE 0.9 % IV SOLN
2400.0000 mg/m2 | INTRAVENOUS | Status: DC
  Administered 2022-08-05: 4350 mg via INTRAVENOUS
  Filled 2022-08-05: qty 87

## 2022-08-05 MED ORDER — SODIUM CHLORIDE 0.9% FLUSH
10.0000 mL | Freq: Once | INTRAVENOUS | Status: AC
  Administered 2022-08-05: 10 mL

## 2022-08-05 MED ORDER — SODIUM CHLORIDE 0.9 % IV SOLN
180.0000 mg/m2 | Freq: Once | INTRAVENOUS | Status: AC
  Administered 2022-08-05: 320 mg via INTRAVENOUS
  Filled 2022-08-05: qty 15

## 2022-08-05 MED ORDER — SODIUM CHLORIDE 0.9 % IV SOLN
10.0000 mg | Freq: Once | INTRAVENOUS | Status: AC
  Administered 2022-08-05: 10 mg via INTRAVENOUS
  Filled 2022-08-05: qty 10

## 2022-08-05 MED ORDER — SODIUM CHLORIDE 0.9 % IV SOLN
400.0000 mg/m2 | Freq: Once | INTRAVENOUS | Status: AC
  Administered 2022-08-05: 728 mg via INTRAVENOUS
  Filled 2022-08-05: qty 25

## 2022-08-05 MED ORDER — ATROPINE SULFATE 1 MG/ML IV SOLN
0.5000 mg | Freq: Once | INTRAVENOUS | Status: AC
  Administered 2022-08-05: 0.5 mg via INTRAVENOUS
  Filled 2022-08-05: qty 1

## 2022-08-05 MED ORDER — PALONOSETRON HCL INJECTION 0.25 MG/5ML
0.2500 mg | Freq: Once | INTRAVENOUS | Status: AC
  Administered 2022-08-05: 0.25 mg via INTRAVENOUS
  Filled 2022-08-05: qty 5

## 2022-08-05 MED ORDER — SODIUM CHLORIDE 0.9 % IV SOLN: Freq: Once | INTRAVENOUS | Status: AC

## 2022-08-05 MED ORDER — SODIUM CHLORIDE 0.9 % IV SOLN
6.0000 mg/kg | Freq: Once | INTRAVENOUS | Status: AC
  Administered 2022-08-05: 400 mg via INTRAVENOUS
  Filled 2022-08-05: qty 20

## 2022-08-05 NOTE — Patient Instructions (Addendum)
[facility]   Discharge Instructions: Thank you for choosing [facility] to provide your oncology and hematology care.   If you have a lab appointment with the [facility], please go directly to the [facility] and check in at the registration area.   Wear comfortable clothing and clothing appropriate for easy access to any Portacath or PICC line.   We strive to give you quality time with your provider. You may need to reschedule your appointment if you arrive late (15 or more minutes).  Arriving late affects you and other patients whose appointments are after yours.  Also, if you miss three or more appointments without notifying the office, you may be dismissed from the clinic at the provider's discretion.      For prescription refill requests, have your pharmacy contact our office and allow 72 hours for refills to be completed.    Today you received the following chemotherapy and/or immunotherapy agents: Panitumumab (Vectibix), Irinotecan, Leucovorin, and Fluorouracil (Adrucil)      To help prevent nausea and vomiting after your treatment, we encourage you to take your nausea medication as directed.  BELOW ARE SYMPTOMS THAT SHOULD BE REPORTED IMMEDIATELY: *FEVER GREATER THAN 100.4 F (38 C) OR HIGHER *CHILLS OR SWEATING *NAUSEA AND VOMITING THAT IS NOT CONTROLLED WITH YOUR NAUSEA MEDICATION *UNUSUAL SHORTNESS OF BREATH *UNUSUAL BRUISING OR BLEEDING *URINARY PROBLEMS (pain or burning when urinating, or frequent urination) *BOWEL PROBLEMS (unusual diarrhea, constipation, pain near the anus) TENDERNESS IN MOUTH AND THROAT WITH OR WITHOUT PRESENCE OF ULCERS (sore throat, sores in mouth, or a toothache) UNUSUAL RASH, SWELLING OR PAIN  UNUSUAL VAGINAL DISCHARGE OR ITCHING   Items with * indicate a potential emergency and should be followed up as soon as possible or go to the Emergency Department if any problems should occur.  Please show the  CHEMOTHERAPY ALERT CARD or IMMUNOTHERAPY ALERT CARD at check-in to the Emergency Department and triage nurse.  Should you have questions after your visit or need to cancel or reschedule your appointment, please contact [facility]  Dept: [phone]  and follow the prompts.  Office hours are 8:00 a.m. to 4:30 p.m. Monday - Friday. Please note that voicemails left after 4:00 p.m. may not be returned until the following business day.  We are closed weekends and major holidays. You have access to a nurse at all times for urgent questions. Please call the main number to the clinic Dept: [phone] and follow the prompts.   For any non-urgent questions, you may also contact your provider using MyChart. We now offer e-Visits for anyone 52 and older to request care online for non-urgent symptoms. For details visit [email or web address].   Also download the MyChart app! Go to the app store, search "MyChart", open the app, select [facility], and log in with your MyChart username and password.  The chemotherapy medication bag should finish at 46 hours, 96 hours, or 7 days. For example, if your pump is scheduled for 46 hours and it was put on at 4:00 p.m., it should finish at 2:00 p.m. the day it is scheduled to come off regardless of your appointment time.     Estimated time to finish at    12:00 PM     on Friday, January 5th.   If the display on your pump reads "Low Volume" and it is beeping, take the batteries out of the pump and come to the cancer center for it  to be taken off.   If the pump alarms go off prior to the pump reading "Low Volume" then call [phone] and someone can assist you.  If the plunger comes out and the chemotherapy medication is leaking out, please use your home chemo spill kit to clean up the spill. Do NOT use paper towels or other household products.  If you have problems or questions regarding your pump, please call either [phone]  (24 hours a day) or the cancer center Monday-Friday 8:00 a.m.- 4:30 p.m. at the clinic number and we will assist you. If you are unable to get assistance, then go to the nearest Emergency Department and ask the staff to contact the IV team for assistance.

## 2022-08-05 NOTE — Progress Notes (Signed)
Per Dr. Burr Medico - okay to proceed with elevated HR.

## 2022-08-07 ENCOUNTER — Inpatient Hospital Stay: Payer: Medicaid Other

## 2022-08-07 VITALS — BP 107/77 | HR 109 | Temp 98.3°F | Resp 18

## 2022-08-07 DIAGNOSIS — Z5111 Encounter for antineoplastic chemotherapy: Secondary | ICD-10-CM | POA: Diagnosis not present

## 2022-08-07 DIAGNOSIS — C186 Malignant neoplasm of descending colon: Secondary | ICD-10-CM

## 2022-08-07 MED ORDER — HEPARIN SOD (PORK) LOCK FLUSH 100 UNIT/ML IV SOLN
500.0000 [IU] | Freq: Once | INTRAVENOUS | Status: AC | PRN
  Administered 2022-08-07: 500 [IU]

## 2022-08-07 MED ORDER — SODIUM CHLORIDE 0.9% FLUSH
10.0000 mL | INTRAVENOUS | Status: DC | PRN
  Administered 2022-08-07: 10 mL

## 2022-08-14 ENCOUNTER — Other Ambulatory Visit (HOSPITAL_COMMUNITY): Payer: Self-pay

## 2022-08-18 ENCOUNTER — Other Ambulatory Visit: Payer: Self-pay

## 2022-08-18 DIAGNOSIS — C189 Malignant neoplasm of colon, unspecified: Secondary | ICD-10-CM

## 2022-08-18 DIAGNOSIS — C186 Malignant neoplasm of descending colon: Secondary | ICD-10-CM

## 2022-08-18 MED FILL — Dexamethasone Sodium Phosphate Inj 100 MG/10ML: INTRAMUSCULAR | Qty: 1 | Status: AC

## 2022-08-18 NOTE — Progress Notes (Signed)
[facility]   Telephone:([phone] Fax:([phone]   Clinic Follow up Note   Patient Care Team: Medicine, [facility] as PCP - General Berna Bue, MD as Consulting Physician (General Surgery) Malachy Mood, MD as Consulting Physician (Oncology)  Date of Service:  08/19/2022  CHIEF COMPLAINT: f/u of metastatic colon cancer    CURRENT THERAPY:  FOLFIRI, q14d, starting 05/28/22 -Vectibix to be added with cycle 2   ASSESSMENT:  Matthew [name] is a 57 y.o. 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  -he started FOLFIRI on 05/08/2022, Vectibix added from cycle 2, tolerating well overall -I personally reviewed with the restaging CT scan image from August 02, 2022, which showed good partial response in pulmonary metastasis, no other new lesions. -Will continue current treatment.  Schizophrenia (HCC) -he was diagnosed with schizophrenia in his 20's, managed with medication, he is on disability. -he lives with his mother; he is able to do ADLs for himself, but not iADLS. They are both on disability. His mother also seems to have some kind of mental impairment     PLAN: - lab reviewed -Proceed with C7 FOLFIRI+Vectibix today at same dose   -lab,f/u and  Folfiri + vectibix 09/02/2022      SUMMARY OF ONCOLOGIC HISTORY: Oncology History Overview Note   Cancer Staging  Cancer of left colon [facility]) Staging form: Colon and Rectum, AJCC 8th Edition - Pathologic stage from 08/08/2021: Stage IIIB (pT3, pN1a, cM0) - Signed by Malachy Mood, MD on 08/26/2021    Cancer of left colon (HCC)  04/01/2020 Imaging   IMPRESSION: 1. Gallbladder decompressed bowel also partially calcified gallstones. No  pericholecystic inflammation though if there is persisting clinical concern for cholecystitis right upper quadrant ultrasound could be obtained. 2. Circumferential thickening of the distal thoracic esophagus. Could reflect features of esophagitis. Correlate with clinical symptoms and consider endoscopy as clinically warranted. 3. Additional segmental thickening of the mid to distal sigmoid with focal narrowing. No acute surrounding inflammation or resulting obstruction. Findings are nonspecific, and could reflect sequela of prior inflammation/infection. However, recommend correlation with colonoscopy if not recently performed. 4. Mild circumferential bladder wall thickening and indentation of the bladder base by an enlarged prostate. Possibly sequela of chronic outlet obstruction though could correlate with urinalysis to exclude cystitis. 5. Aortic Atherosclerosis (ICD10-I70.0).   08/06/2021 Imaging   IMPRESSION: Sigmoid colonic perforation with small free intraperitoneal gas and infiltration of the mesenteric and omental fat in keeping with changes of peritonitis.   Long segment inflammatory stranding of the sigmoid colon in keeping with a severe infectious or inflammatory colitis. This terminates an area of irregular mural thickening, infiltrative soft tissue within the colonic mesentery, and focal dystrophic calcification. This may represent a chronic inflammatory process, however, a perforated malignancy could appear similarly. There are 2 separate points of perforation which again raise the question of an underlying malignancy.   7.1 cm gas and fluid containing pericolonic abscess within the sigmoid mesentery.   Marked inflammatory change of the terminal ileum adjacent to the pericolonic abscess with resultant small bowel obstruction. Fluid within the distal esophagus likely relates to gastroesophageal reflux the setting of vomiting.   Aortic Atherosclerosis (ICD10-I70.0).    08/08/2021 Cancer Staging   Staging form: Colon  and Rectum, AJCC 8th Edition - Pathologic stage from 08/08/2021: Stage IIIB (pT3, pN1a, cM0) - Signed by Malachy Mood, MD on 08/26/2021 Stage prefix: Initial diagnosis Total positive nodes: 1 Histologic grading system: 4 grade system Histologic grade (G): G2 Residual tumor (R): R0 - None   08/08/2021 Definitive Surgery   FINAL MICROSCOPIC DIAGNOSIS:   A. COLON, SIGMOID, PARTIAL COLECTOMY:  - Invasive moderately differentiated adenocarcinoma.  - Metastatic carcinoma involving one of twelve lymph nodes (1/12).  - See oncology table below.   ADDENDUM:  Mismatch Repair Protein (IHC)  SUMMARY INTERPRETATION: NORMAL    08/14/2021 Imaging   EXAM: CT ABDOMEN AND PELVIS WITH CONTRAST  IMPRESSION: 1. Post recent sigmoid colectomy with left lower quadrant colostomy. Two small residual foci of air within the pelvic mesentery with mild adjacent thickening, but no abscess or drainable collection. Trace non organized free fluid and stranding in the pelvis. 2. Short segment of small bowel wall thickening and inflammation in the pelvis involving the distal ileum, likely reactive. 3. Dilated distal esophagus, stomach, and small bowel, without discrete transition point, favoring postoperative ileus. 4. Small bilateral pleural effusions and compressive atelectasis. 5. Heterogeneous partially enhancing 14 mm lymph node in the retroperitoneum at the aortoiliac bifurcation, not significantly changed from prior exam. Suspected additional lymph nodes in the anterior common iliac space, not significantly changed from prior exam. Recommend attention at follow-up. 6. Additional chronic findings as described.     08/15/2021 Imaging   EXAM: CT CHEST WITH CONTRAST  IMPRESSION: 1. No evidence of thoracic metastasis. 2. Bilateral small layering pleural effusions with passive atelectasis   08/26/2021 Initial Diagnosis   Cancer of left colon (HCC)   10/10/2021 -  12/12/2021 Chemotherapy   Patient is on Treatment Plan : COLORECTAL Xelox ([place]ox) q21d     05/28/2022 - 05/28/2022 Chemotherapy   Patient is on Treatment Plan : COLORECTAL FOLFIRI + Bevacizumab q14d     05/28/2022 -  Chemotherapy   Patient is on Treatment Plan : COLORECTAL FOLFIRI + Panitumumab q14d        INTERVAL HISTORY:  Matthew [name] is here for a follow up of  metastatic colon cancer   He was last seen by me on 08/05/2022 He presents to the clinic accompanied by mother. Pt has no concerns Pt states he Is eating well. Pt mother states he hsd done well after last treatment. She said he has some heavy saliva but no vomiting. Pt denies numbness and tingling in hands.   All other systems were reviewed with the patient and are negative.  MEDICAL HISTORY:  Past Medical History:  Diagnosis Date   Cancer (HCC)    Schizophrenia [facility])     SURGICAL HISTORY: Past Surgical History:  Procedure Laterality Date   BRONCHIAL BIOPSY  05/11/2022   Procedure: BRONCHIAL BIOPSIES;  Surgeon: Leslye Peer, MD;  Location: [facility] ENDOSCOPY;  Service: Pulmonary;;   BRONCHIAL BRUSHINGS  05/11/2022   Procedure: BRONCHIAL BRUSHINGS;  Surgeon: Leslye Peer, MD;  Location: [facility] ENDOSCOPY;  Service: Pulmonary;;   BRONCHIAL NEEDLE ASPIRATION BIOPSY  05/11/2022   Procedure: BRONCHIAL NEEDLE ASPIRATION BIOPSIES;  Surgeon: Leslye Peer, MD;  Location: [facility] ENDOSCOPY;  Service: Pulmonary;;   BRONCHIAL WASHINGS  05/11/2022   Procedure: BRONCHIAL WASHINGS;  Surgeon: Leslye Peer, MD;  Location: [facility] ENDOSCOPY;  Service: Pulmonary;;   IR IMAGING GUIDED PORT INSERTION  05/25/2022   LAPAROTOMY N/A 08/08/2021   Procedure: EXPLORATORY LAPAROTOMY;  Surgeon: Berna Bue, MD;  Location: [facility] OR;  Service: General;  Laterality: N/A;   PARTIAL COLECTOMY N/A 08/08/2021   Procedure: PARTIAL COLECTOMY WITH COLOSTOMY;  Surgeon: Berna Bue, MD;  Location: [facility] OR;  Service: General;  Laterality: N/A;   VIDEO BRONCHOSCOPY WITH  RADIAL ENDOBRONCHIAL ULTRASOUND  05/11/2022   Procedure: VIDEO BRONCHOSCOPY WITH RADIAL ENDOBRONCHIAL ULTRASOUND;  Surgeon: Leslye Peer, MD;  Location: [facility] ENDOSCOPY;  Service: Pulmonary;;    I have reviewed the social history and family history with the patient and they are unchanged from previous note.  ALLERGIES:  has No Known Allergies.  MEDICATIONS:  Current Outpatient Medications  Medication Sig Dispense Refill   ondansetron (ZOFRAN) 8 MG tablet Take 1 tablet (8 mg total) by mouth every 8 (eight) hours as needed for nausea or vomiting. 30 tablet 0   prochlorperazine (COMPAZINE) 10 MG tablet Take 1 tablet (10 mg total) by mouth every 6 (six) hours as needed for nausea or vomiting. 30 tablet 0   acetaminophen (TYLENOL) 500 MG tablet Take 2 tablets (1,000 mg total) by mouth every 6 (six) hours as needed for mild pain or moderate pain.  0   aspirin EC 81 MG tablet Take 81 mg by mouth daily as needed (for pain or headaches).      benztropine (COGENTIN) 1 MG tablet Take 1 mg at bedtime by mouth.     clindamycin (CLINDAGEL) 1 % gel Apply topically 2 (two) times daily. To skin rash on face and upper body 30 g 0   ferrous sulfate 325 (65 FE) MG EC tablet Take 1 tablet (325 mg total) by mouth daily. (Patient not taking: Reported on 05/07/2022) 30 tablet 3   hydrocortisone cream 1 % Apply 1 Application topically 2 (two) times daily as needed for itching. For rash 30 g 2   Multiple Vitamin (MULTIVITAMIN WITH MINERALS) TABS tablet Take 1 tablet by mouth daily.     paliperidone (INVEGA SUSTENNA) 156 MG/ML SUSP injection Inject 156 mg every 30 (thirty) days into the muscle.     polyethylene glycol (MIRALAX / GLYCOLAX) 17 g packet Take 17 g by mouth daily as needed for mild constipation or moderate constipation. (Patient not taking: Reported on 05/07/2022)  0   No current facility-administered medications for this visit.    PHYSICAL EXAMINATION: ECOG PERFORMANCE STATUS: 1 - Symptomatic but  completely ambulatory  Vitals:   08/19/22 1135  BP: 112/82  Pulse: 97  Resp: 18  Temp: 98.6 F (37 C)  SpO2: 100%   Wt Readings from Last 3 Encounters:  08/19/22 147 lb 9.6 oz (67 kg)  08/05/22 148 lb 12.8 oz (67.5 kg)  07/23/22 146 lb 11.2 oz (66.5 kg)     GENERAL:alert, no distress and comfortable SKIN: skin color normal, no rashes or significant lesions EYES: normal, Conjunctiva are pink and non-injected, sclera clear  NEURO: alert & oriented x 3 with fluent speech LUNGS: (-)clear to auscultation and percussion with normal breathing effort HEART:(-) regular rate & rhythm and no murmurs and no lower extremity edema  LABORATORY DATA:  I have reviewed the data as listed    Latest Ref Rng & Units 08/19/2022   11:12 AM 08/05/2022    9:19 AM 07/23/2022    9:01 AM  CBC  WBC 4.0 - 10.5 K/uL 4.5  4.6  4.7   Hemoglobin 13.0 - 17.0 g/dL 86.5  45.6  13.2   Hematocrit 39.0 - 52.0 % 35.4  35.8  36.4   Platelets 150 - 400 K/uL 326  321  333  Latest Ref Rng & Units 08/19/2022   11:12 AM 08/05/2022    9:19 AM 07/23/2022    9:01 AM  CMP  Glucose 70 - 99 mg/dL 92  95  107   BUN 6 - 20 mg/dL 10  8  9    Creatinine 0.61 - 1.24 mg/dL 0.66  0.68  0.75   Sodium 135 - 145 mmol/L 136  136  141   Potassium 3.5 - 5.1 mmol/L 3.6  3.4  3.7   Chloride 98 - 111 mmol/L 105  101  108   CO2 22 - 32 mmol/L 26  28  26    Calcium 8.9 - 10.3 mg/dL 9.3  9.2  9.7   Total Protein 6.5 - 8.1 g/dL 6.5  6.4  7.2   Total Bilirubin 0.3 - 1.2 mg/dL 0.5  0.7  0.9   Alkaline Phos 38 - 126 U/L 154  171  158   AST 15 - 41 U/L 24  36  42   ALT 0 - 44 U/L 53  79  56       RADIOGRAPHIC STUDIES: I have personally reviewed the radiological images as listed and agreed with the findings in the report. No results found.    Orders Placed This Encounter  Procedures   CBC with Differential ([place] Only)    Standing Status:   Future    Standing Expiration Date:   09/17/2023   CMP ([place] only)     Standing Status:   Future    Standing Expiration Date:   09/17/2023   Magnesium    Standing Status:   Future    Standing Expiration Date:   09/17/2023   All questions were answered. The patient knows to call the clinic with any problems, questions or concerns. No barriers to learning was detected. The total time spent in the appointment was 30 minutes.     Truitt Merle, MD 08/19/2022   Felicity Coyer, CMA, am acting as scribe for Truitt Merle, MD.   I have reviewed the above documentation for accuracy and completeness, and I agree with the above.   ,m

## 2022-08-18 NOTE — Assessment & Plan Note (Signed)
-  he was diagnosed with schizophrenia in his 20's, managed with medication, he is on disability. -he lives with his mother; he is able to do ADLs for himself, but not iADLS. They are both on disability. His mother also seems to have some kind of mental impairment

## 2022-08-18 NOTE — Assessment & Plan Note (Signed)
stage IIIB p(T3, N1aM0), MSS, KRAS/NRAS/BRAF wild type, lung and node metastasis in 04/2022  -diagnosed in 08/2021 -he completed 4 cycles adjuvant CAPOX 10/10/21 - 12/25/21 (though he somehow was still taking Xeloda through ~02/09/22 due to misunderstandings).  -he started FOLFIRI on 05/08/2022, Vectibix added from cycle 2, tolerating well overall -I personally reviewed with the restaging CT scan image from August 02, 2022, which showed good partial response in pulmonary metastasis, no other new lesions. -Will continue current treatment.

## 2022-08-19 ENCOUNTER — Other Ambulatory Visit: Payer: Self-pay

## 2022-08-19 ENCOUNTER — Encounter: Payer: Self-pay | Admitting: Hematology

## 2022-08-19 ENCOUNTER — Inpatient Hospital Stay: Payer: Medicaid Other

## 2022-08-19 ENCOUNTER — Inpatient Hospital Stay (HOSPITAL_BASED_OUTPATIENT_CLINIC_OR_DEPARTMENT_OTHER): Payer: Medicaid Other | Admitting: Hematology

## 2022-08-19 VITALS — BP 112/82 | HR 97 | Temp 98.6°F | Resp 18 | Ht 69.0 in | Wt 147.6 lb

## 2022-08-19 DIAGNOSIS — F209 Schizophrenia, unspecified: Secondary | ICD-10-CM | POA: Diagnosis not present

## 2022-08-19 DIAGNOSIS — C186 Malignant neoplasm of descending colon: Secondary | ICD-10-CM

## 2022-08-19 DIAGNOSIS — Z5111 Encounter for antineoplastic chemotherapy: Secondary | ICD-10-CM | POA: Diagnosis not present

## 2022-08-19 DIAGNOSIS — C189 Malignant neoplasm of colon, unspecified: Secondary | ICD-10-CM

## 2022-08-19 DIAGNOSIS — Z95828 Presence of other vascular implants and grafts: Secondary | ICD-10-CM

## 2022-08-19 LAB — CBC WITH DIFFERENTIAL (CANCER CENTER ONLY)
Abs Immature Granulocytes: 0 10*3/uL (ref 0.00–0.07)
Basophils Absolute: 0 10*3/uL (ref 0.0–0.1)
Basophils Relative: 1 %
Eosinophils Absolute: 0.1 10*3/uL (ref 0.0–0.5)
Eosinophils Relative: 3 %
HCT: 35.4 % — ABNORMAL LOW (ref 39.0–52.0)
Hemoglobin: 11.9 g/dL — ABNORMAL LOW (ref 13.0–17.0)
Immature Granulocytes: 0 %
Lymphocytes Relative: 22 %
Lymphs Abs: 1 10*3/uL (ref 0.7–4.0)
MCH: 29.5 pg (ref 26.0–34.0)
MCHC: 33.6 g/dL (ref 30.0–36.0)
MCV: 87.6 fL (ref 80.0–100.0)
Monocytes Absolute: 0.4 10*3/uL (ref 0.1–1.0)
Monocytes Relative: 9 %
Neutro Abs: 3 10*3/uL (ref 1.7–7.7)
Neutrophils Relative %: 65 %
Platelet Count: 326 10*3/uL (ref 150–400)
RBC: 4.04 MIL/uL — ABNORMAL LOW (ref 4.22–5.81)
RDW: 14.5 % (ref 11.5–15.5)
WBC Count: 4.5 10*3/uL (ref 4.0–10.5)
nRBC: 0 % (ref 0.0–0.2)

## 2022-08-19 LAB — CMP (CANCER CENTER ONLY)
ALT: 53 U/L — ABNORMAL HIGH (ref 0–44)
AST: 24 U/L (ref 15–41)
Albumin: 3.8 g/dL (ref 3.5–5.0)
Alkaline Phosphatase: 154 U/L — ABNORMAL HIGH (ref 38–126)
Anion gap: 5 (ref 5–15)
BUN: 10 mg/dL (ref 6–20)
CO2: 26 mmol/L (ref 22–32)
Calcium: 9.3 mg/dL (ref 8.9–10.3)
Chloride: 105 mmol/L (ref 98–111)
Creatinine: 0.66 mg/dL (ref 0.61–1.24)
GFR, Estimated: >60 mL/min (ref >60)
Glucose, Bld: 92 mg/dL (ref 70–99)
Potassium: 3.6 mmol/L (ref 3.5–5.1)
Sodium: 136 mmol/L (ref 135–145)
Total Bilirubin: 0.5 mg/dL (ref 0.3–1.2)
Total Protein: 6.5 g/dL (ref 6.5–8.1)

## 2022-08-19 LAB — MAGNESIUM: Magnesium: 1.8 mg/dL (ref 1.7–2.4)

## 2022-08-19 MED ORDER — SODIUM CHLORIDE 0.9 % IV SOLN
180.0000 mg/m2 | Freq: Once | INTRAVENOUS | Status: AC
  Administered 2022-08-19: 320 mg via INTRAVENOUS
  Filled 2022-08-19: qty 15

## 2022-08-19 MED ORDER — PROCHLORPERAZINE MALEATE 10 MG PO TABS: 10.0000 mg | ORAL_TABLET | Freq: Four times a day (QID) | ORAL | 0 refills | Status: DC | PRN

## 2022-08-19 MED ORDER — PALONOSETRON HCL INJECTION 0.25 MG/5ML
0.2500 mg | Freq: Once | INTRAVENOUS | Status: AC
  Administered 2022-08-19: 0.25 mg via INTRAVENOUS
  Filled 2022-08-19: qty 5

## 2022-08-19 MED ORDER — SODIUM CHLORIDE 0.9% FLUSH: 10.0000 mL | INTRAVENOUS | Status: DC | PRN

## 2022-08-19 MED ORDER — SODIUM CHLORIDE 0.9 % IV SOLN
2400.0000 mg/m2 | INTRAVENOUS | Status: DC
  Administered 2022-08-19: 4350 mg via INTRAVENOUS
  Filled 2022-08-19: qty 87

## 2022-08-19 MED ORDER — SODIUM CHLORIDE 0.9 % IV SOLN
10.0000 mg | Freq: Once | INTRAVENOUS | Status: AC
  Administered 2022-08-19: 10 mg via INTRAVENOUS
  Filled 2022-08-19: qty 10

## 2022-08-19 MED ORDER — SODIUM CHLORIDE 0.9% FLUSH
10.0000 mL | Freq: Once | INTRAVENOUS | Status: AC
  Administered 2022-08-19: 10 mL

## 2022-08-19 MED ORDER — ONDANSETRON HCL 8 MG PO TABS: 8.0000 mg | ORAL_TABLET | Freq: Three times a day (TID) | ORAL | 0 refills | Status: AC | PRN

## 2022-08-19 MED ORDER — ATROPINE SULFATE 1 MG/ML IV SOLN
0.5000 mg | Freq: Once | INTRAVENOUS | Status: AC | PRN
  Administered 2022-08-19: 0.5 mg via INTRAVENOUS
  Filled 2022-08-19: qty 1

## 2022-08-19 MED ORDER — SODIUM CHLORIDE 0.9 % IV SOLN
400.0000 mg/m2 | Freq: Once | INTRAVENOUS | Status: AC
  Administered 2022-08-19: 728 mg via INTRAVENOUS
  Filled 2022-08-19: qty 36.4

## 2022-08-19 MED ORDER — SODIUM CHLORIDE 0.9 % IV SOLN: Freq: Once | INTRAVENOUS | Status: AC

## 2022-08-19 MED ORDER — SODIUM CHLORIDE 0.9 % IV SOLN
6.0000 mg/kg | Freq: Once | INTRAVENOUS | Status: AC
  Administered 2022-08-19: 400 mg via INTRAVENOUS
  Filled 2022-08-19: qty 20

## 2022-08-19 NOTE — Patient Instructions (Addendum)
[facility] MEDICAL ONCOLOGY   Discharge Instructions: Thank you for choosing [facility] to provide your oncology and hematology care.   If you have a lab appointment with the Cancer Center, please go directly to the Cancer Center and check in at the registration area.   Wear comfortable clothing and clothing appropriate for easy access to any Portacath or PICC line.   We strive to give you quality time with your provider. You may need to reschedule your appointment if you arrive late (15 or more minutes).  Arriving late affects you and other patients whose appointments are after yours.  Also, if you miss three or more appointments without notifying the office, you may be dismissed from the clinic at the provider's discretion.      For prescription refill requests, have your pharmacy contact our office and allow 72 hours for refills to be completed.    Today you received the following chemotherapy and/or immunotherapy agents: Panitumumab (Vectibix), Irinotecan, Leucovorin, and Fluorouracil (Adrucil)      To help prevent nausea and vomiting after your treatment, we encourage you to take your nausea medication as directed.  BELOW ARE SYMPTOMS THAT SHOULD BE REPORTED IMMEDIATELY: *FEVER GREATER THAN 100.4 F (38 C) OR HIGHER *CHILLS OR SWEATING *NAUSEA AND VOMITING THAT IS NOT CONTROLLED WITH YOUR NAUSEA MEDICATION *UNUSUAL SHORTNESS OF BREATH *UNUSUAL BRUISING OR BLEEDING *URINARY PROBLEMS (pain or burning when urinating, or frequent urination) *BOWEL PROBLEMS (unusual diarrhea, constipation, pain near the anus) TENDERNESS IN MOUTH AND THROAT WITH OR WITHOUT PRESENCE OF ULCERS (sore throat, sores in mouth, or a toothache) UNUSUAL RASH, SWELLING OR PAIN  UNUSUAL VAGINAL DISCHARGE OR ITCHING   Items with * indicate a potential emergency and should be followed up as soon as possible or go to the Emergency Department if any problems should occur.  Please show the  CHEMOTHERAPY ALERT CARD or IMMUNOTHERAPY ALERT CARD at check-in to the Emergency Department and triage nurse.  Should you have questions after your visit or need to cancel or reschedule your appointment, please contact [facility]COL[facility]  Dept: [phone]  and follow the prompts.  Office hours are 8:00 a.m. to 4:30 p.m. Monday - Friday. Please note that voicemails left after 4:00 p.m. may not be returned until the following business day.  We are closed weekends and major holidays. You have access to a nurse at all times for urgent questions. Please call the main number to the clinic Dept: [phone] and follow the prompts.   For any non-urgent questions, you may also contact your provider using MyChart. We now offer e-Visits for anyone 19 and older to request care online for non-urgent symptoms. For details visit [email or web address].   Also download the MyChart app! Go to the app store, search "MyChart", open the app, select , and log in with your MyChart username and password.  The chemotherapy medication bag should finish at 46 hours, 96 hours, or 7 days. For example, if your pump is scheduled for 46 hours and it was put on at 4:00 p.m., it should finish at 2:00 p.m. the day it is scheduled to come off regardless of your appointment time.     Estimated time to finish at   1:30 PM  on Friday, January 5th.   If the display on your pump reads "Low Volume" and it is beeping, take the batteries out of the pump and come to the cancer center for it to be taken off.  If the pump alarms go off prior to the pump reading "Low Volume" then call [phone] and someone can assist you.  If the plunger comes out and the chemotherapy medication is leaking out, please use your home chemo spill kit to clean up the spill. Do NOT use paper towels or other household products.  If you have problems or questions regarding your pump, please call either [phone] (24  hours a day) or the cancer center Monday-Friday 8:00 a.m.- 4:30 p.m. at the clinic number and we will assist you. If you are unable to get assistance, then go to the nearest Emergency Department and ask the staff to contact the IV team for assistance.  [facility]COL[facility]   Discharge Instructions: Thank you for choosing [facility] to provide your oncology and hematology care.   If you have a lab appointment with the Cancer Center, please go directly to the Cancer Center and check in at the registration area.   Wear comfortable clothing and clothing appropriate for easy access to any Portacath or PICC line.   We strive to give you quality time with your provider. You may need to reschedule your appointment if you arrive late (15 or more minutes).  Arriving late affects you and other patients whose appointments are after yours.  Also, if you miss three or more appointments without notifying the office, you may be dismissed from the clinic at the provider's discretion.      For prescription refill requests, have your pharmacy contact our office and allow 72 hours for refills to be completed.    Today you received the following chemotherapy and/or immunotherapy agents: Panitumumab (Vectibix), Irinotecan, Leucovorin, and Fluorouracil (Adrucil)      To help prevent nausea and vomiting after your treatment, we encourage you to take your nausea medication as directed.  BELOW ARE SYMPTOMS THAT SHOULD BE REPORTED IMMEDIATELY: *FEVER GREATER THAN 100.4 F (38 C) OR HIGHER *CHILLS OR SWEATING *NAUSEA AND VOMITING THAT IS NOT CONTROLLED WITH YOUR NAUSEA MEDICATION *UNUSUAL SHORTNESS OF BREATH *UNUSUAL BRUISING OR BLEEDING *URINARY PROBLEMS (pain or burning when urinating, or frequent urination) *BOWEL PROBLEMS (unusual diarrhea, constipation, pain near the anus) TENDERNESS IN MOUTH AND THROAT WITH OR WITHOUT PRESENCE OF ULCERS (sore throat, sores in mouth, or a  toothache) UNUSUAL RASH, SWELLING OR PAIN  UNUSUAL VAGINAL DISCHARGE OR ITCHING   Items with * indicate a potential emergency and should be followed up as soon as possible or go to the Emergency Department if any problems should occur.  Please show the CHEMOTHERAPY ALERT CARD or IMMUNOTHERAPY ALERT CARD at check-in to the Emergency Department and triage nurse.  Should you have questions after your visit or need to cancel or reschedule your appointment, please contact [facility]COL[facility]  Dept: [phone]  and follow the prompts.  Office hours are 8:00 a.m. to 4:30 p.m. Monday - Friday. Please note that voicemails left after 4:00 p.m. may not be returned until the following business day.  We are closed weekends and major holidays. You have access to a nurse at all times for urgent questions. Please call the main number to the clinic Dept: [phone] and follow the prompts.   For any non-urgent questions, you may also contact your provider using MyChart. We now offer e-Visits for anyone 40 and older to request care online for non-urgent symptoms. For details visit [email or web address].   Also download the MyChart app! Go to the app store, search "MyChart", open  the app, select [facility], and log in with your MyChart username and password.  The chemotherapy medication bag should finish at 46 hours, 96 hours, or 7 days. For example, if your pump is scheduled for 46 hours and it was put on at 4:00 p.m., it should finish at 2:00 p.m. the day it is scheduled to come off regardless of your appointment time.     Estimated time to finish at    12:00 PM     on Friday, January 5th.   If the display on your pump reads "Low Volume" and it is beeping, take the batteries out of the pump and come to the cancer center for it to be taken off.   If the pump alarms go off prior to the pump reading "Low Volume" then call [phone] and someone can assist you.  If the plunger comes  out and the chemotherapy medication is leaking out, please use your home chemo spill kit to clean up the spill. Do NOT use paper towels or other household products.  If you have problems or questions regarding your pump, please call either [phone] (24 hours a day) or the cancer center Monday-Friday 8:00 a.m.- 4:30 p.m. at the clinic number and we will assist you. If you are unable to get assistance, then go to the nearest Emergency Department and ask the staff to contact the IV team for assistance.

## 2022-08-20 ENCOUNTER — Telehealth: Payer: Self-pay | Admitting: Hematology

## 2022-08-20 NOTE — Telephone Encounter (Signed)
Spoke with patient mother confirming upcoming appointments

## 2022-08-21 ENCOUNTER — Inpatient Hospital Stay: Payer: Medicaid Other

## 2022-08-21 ENCOUNTER — Other Ambulatory Visit: Payer: Self-pay

## 2022-08-21 VITALS — BP 119/77 | HR 114 | Temp 99.0°F | Resp 20

## 2022-08-21 DIAGNOSIS — C186 Malignant neoplasm of descending colon: Secondary | ICD-10-CM

## 2022-08-21 DIAGNOSIS — Z5111 Encounter for antineoplastic chemotherapy: Secondary | ICD-10-CM | POA: Diagnosis not present

## 2022-08-21 MED ORDER — HEPARIN SOD (PORK) LOCK FLUSH 100 UNIT/ML IV SOLN
500.0000 [IU] | Freq: Once | INTRAVENOUS | Status: AC | PRN
  Administered 2022-08-21: 500 [IU]

## 2022-08-21 MED ORDER — SODIUM CHLORIDE 0.9% FLUSH
10.0000 mL | INTRAVENOUS | Status: DC | PRN
  Administered 2022-08-21: 10 mL

## 2022-09-01 MED FILL — Dexamethasone Sodium Phosphate Inj 100 MG/10ML: INTRAMUSCULAR | Qty: 1 | Status: AC

## 2022-09-01 NOTE — Progress Notes (Unsigned)
Matthew [name]   [phone] Fax:(336) [phone]   Clinic Follow up Note   Patient Care Team: Medicine, [facility] as PCP - General Clovis Riley, MD as Consulting Physician (General Surgery) Truitt Merle, MD as Consulting Physician (Oncology)  Date of Service:  09/02/2022  CHIEF COMPLAINT: f/u of metastatic colon cancer    CURRENT THERAPY:   FOLFIRI, q14d, starting 05/28/22 -Vectibix to be added with cycle 2  ASSESSMENT:  Matthew [name] is a 57 y.o. 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  -he started FOLFIRI on 05/08/2022, Vectibix added from cycle 2, tolerating well overall -restaging CT scan image from August 02, 2022 showed good partial response in pulmonary metastasis, no other new lesions. -Will continue current treatment  Schizophrenia [facility]) -he was diagnosed with schizophrenia in his 20's, managed with medication, he is on disability. -he lives with his mother; he is able to do ADLs for himself, but not iADLS. They are both on disability. His mother also seems to have some kind of mental impairment     PLAN: - lab reviewed, pt is clinically doing well  - Proceed  with C8 FOLFIRI + VECTIBIX today - lab,flush,f/u in 09/17/2022 -plan to repeat CT scan in early April   SUMMARY [place]: Oncology History Overview Note   Cancer Staging  Cancer of left colon [facility]) Staging form: Colon and Rectum, AJCC 8th Edition - Pathologic stage from 08/08/2021: Stage IIIB (pT3, pN1a, cM0) - Signed by Truitt Merle, MD on 08/26/2021    Cancer of left colon ([place])  04/01/2020 Imaging   IMPRESSION: 1. Gallbladder decompressed bowel also partially calcified gallstones. No pericholecystic  inflammation though if there is persisting clinical concern for cholecystitis right upper quadrant ultrasound could be obtained. 2. Circumferential thickening of the distal thoracic esophagus. Could reflect features of esophagitis. Correlate with clinical symptoms and consider endoscopy as clinically warranted. 3. Additional segmental thickening of the mid to distal sigmoid with focal narrowing. No acute surrounding inflammation or resulting obstruction. Findings are nonspecific, and could reflect sequela of prior inflammation/infection. However, recommend correlation with colonoscopy if not recently performed. 4. Mild circumferential bladder wall thickening and indentation of the bladder base by an enlarged prostate. Possibly sequela of chronic outlet obstruction though could correlate with urinalysis to exclude cystitis. 5. Aortic Atherosclerosis (ICD10-I70.0).   08/06/2021 Imaging   IMPRESSION: Sigmoid colonic perforation with small free intraperitoneal gas and infiltration of the mesenteric and omental fat in keeping with changes of peritonitis.   Long segment inflammatory stranding of the sigmoid colon in keeping with a severe infectious or inflammatory colitis. This terminates an area of irregular mural thickening, infiltrative soft tissue within the colonic mesentery, and focal dystrophic calcification. This may represent a chronic inflammatory process, however, a perforated malignancy could appear similarly. There are 2 separate points of perforation which again raise the question of an underlying malignancy.   7.1 cm gas and fluid containing pericolonic abscess within the sigmoid mesentery.   Marked inflammatory change of the terminal ileum adjacent to the pericolonic abscess with resultant small bowel obstruction. Fluid within the distal esophagus likely relates to gastroesophageal reflux the setting of vomiting.   Aortic Atherosclerosis (ICD10-I70.0).   08/08/2021 Cancer  Staging   Staging form:  Colon and Rectum, AJCC 8th Edition - Pathologic stage from 08/08/2021: Stage IIIB (pT3, pN1a, cM0) - Signed by Truitt Merle, MD on 08/26/2021 Stage prefix: Initial diagnosis Total positive nodes: 1 Histologic grading system: 4 grade system Histologic grade (G): G2 Residual tumor (R): R0 - None   08/08/2021 Definitive Surgery   FINAL MICROSCOPIC DIAGNOSIS:   A. COLON, SIGMOID, PARTIAL COLECTOMY:  - Invasive moderately differentiated adenocarcinoma.  - Metastatic carcinoma involving one of twelve lymph nodes (1/12).  - See oncology table below.   ADDENDUM:  Mismatch Repair Protein (IHC)  SUMMARY INTERPRETATION: NORMAL    08/14/2021 Imaging   EXAM: CT ABDOMEN AND PELVIS WITH CONTRAST  IMPRESSION: 1. Post recent sigmoid colectomy with left lower quadrant colostomy. Two small residual foci of air within the pelvic mesentery with mild adjacent thickening, but no abscess or drainable collection. Trace non organized free fluid and stranding in the pelvis. 2. Short segment of small bowel wall thickening and inflammation in the pelvis involving the distal ileum, likely reactive. 3. Dilated distal esophagus, stomach, and small bowel, without discrete transition point, favoring postoperative ileus. 4. Small bilateral pleural effusions and compressive atelectasis. 5. Heterogeneous partially enhancing 14 mm lymph node in the retroperitoneum at the aortoiliac bifurcation, not significantly changed from prior exam. Suspected additional lymph nodes in the anterior common iliac space, not significantly changed from prior exam. Recommend attention at follow-up. 6. Additional chronic findings as described.     08/15/2021 Imaging   EXAM: CT CHEST WITH CONTRAST  IMPRESSION: 1. No evidence of thoracic metastasis. 2. Bilateral small layering pleural effusions with passive atelectasis   08/26/2021 Initial Diagnosis   Cancer of left colon ([place])   10/10/2021 - 12/12/2021  Chemotherapy   Patient is on Treatment Plan : COLORECTAL Xelox ([place]ox) q21d     05/28/2022 - 05/28/2022 Chemotherapy   Patient is on Treatment Plan : COLORECTAL FOLFIRI + Bevacizumab q14d     05/28/2022 -  Chemotherapy   Patient is on Treatment Plan : COLORECTAL FOLFIRI + Panitumumab q14d     07/31/2022 Imaging    IMPRESSION: Decreased bilateral pulmonary metastases.   Stable mild abdominal lymphadenopathy.   No new or progressive metastatic disease within the chest, abdomen, or pelvis.      INTERVAL HISTORY:  Matthew [name] is here for a follow up of metastatic colon cancer   He was last seen by me on 08/19/2022 He presents to the clinic alone. Pt denies having nausea and is eating well. Pt denies having stomach issues . Pt denies having tinging and numbness.     All other systems were reviewed with the patient and are negative.  MEDICAL HISTORY:  Past Medical History:  Diagnosis Date   Cancer ([place])    Schizophrenia [facility])     SURGICAL HISTORY: Past Surgical History:  Procedure Laterality Date   BRONCHIAL BIOPSY  05/11/2022   Procedure: BRONCHIAL BIOPSIES;  Surgeon: Collene Gobble, MD;  Location[facility] ENDOSCOPY;  Service: Pulmonary;;   BRONCHIAL BRUSHINGS  05/11/2022   Procedure: BRONCHIAL BRUSHINGS;  Surgeon: Collene Gobble, MD;  Location: [facility] ENDOSCOPY;  Service: Pulmonary;;   BRONCHIAL NEEDLE ASPIRATION BIOPSY  05/11/2022   Procedure: BRONCHIAL NEEDLE ASPIRATION BIOPSIES;  Surgeon: Collene Gobble, MD;  Location: [place];  Service: Pulmonary;;   BRONCHIAL WASHINGS  05/11/2022   Procedure: BRONCHIAL WASHINGS;  Surgeon: Collene Gobble, MD;  Location: [place] ENDOSCOPY;  Service: Pulmonary;;   IR IMAGING GUIDED PORT INSERTION  05/25/2022   LAPAROTOMY N/A 08/08/2021  Procedure: EXPLORATORY LAPAROTOMY;  Surgeon: Clovis Riley, MD;  Location: [place];  Service: General;  Laterality: N/A;   PARTIAL COLECTOMY N/A 08/08/2021   Procedure: PARTIAL COLECTOMY WITH COLOSTOMY;   Surgeon: Clovis Riley, MD;  Location: [place];  Service: General;  Laterality: N/A;   VIDEO BRONCHOSCOPY WITH RADIAL ENDOBRONCHIAL ULTRASOUND  05/11/2022   Procedure: VIDEO BRONCHOSCOPY WITH RADIAL ENDOBRONCHIAL ULTRASOUND;  Surgeon: Collene Gobble, MD;  Location: [place] ENDOSCOPY;  Service: Pulmonary;;    I have reviewed the social history and family history with the patient and they are unchanged from previous note.  ALLERGIES:  has No Known Allergies.  MEDICATIONS:  Current Outpatient Medications  Medication Sig Dispense Refill   acetaminophen (TYLENOL) 500 MG tablet Take 2 tablets (1,000 mg total) by mouth every 6 (six) hours as needed for mild pain or moderate pain.  0   aspirin EC 81 MG tablet Take 81 mg by mouth daily as needed (for pain or headaches).      benztropine (COGENTIN) 1 MG tablet Take 1 mg at bedtime by mouth.     clindamycin (CLINDAGEL) 1 % gel Apply topically 2 (two) times daily. To skin rash on face and upper body 30 g 0   ferrous sulfate 325 (65 FE) MG EC tablet Take 1 tablet (325 mg total) by mouth daily. (Patient not taking: Reported on 05/07/2022) 30 tablet 3   hydrocortisone cream 1 % Apply 1 Application topically 2 (two) times daily as needed for itching. For rash 30 g 2   Multiple Vitamin (MULTIVITAMIN WITH MINERALS) TABS tablet Take 1 tablet by mouth daily.     ondansetron (ZOFRAN) 8 MG tablet Take 1 tablet (8 mg total) by mouth every 8 (eight) hours as needed for nausea or vomiting. 30 tablet 0   paliperidone (INVEGA SUSTENNA) 156 MG/ML SUSP injection Inject 156 mg every 30 (thirty) days into the muscle.     polyethylene glycol (MIRALAX / GLYCOLAX) 17 g packet Take 17 g by mouth daily as needed for mild constipation or moderate constipation. (Patient not taking: Reported on 05/07/2022)  0   prochlorperazine (COMPAZINE) 10 MG tablet Take 1 tablet (10 mg total) by mouth every 6 (six) hours as needed for nausea or vomiting. 30 tablet 0   No current  facility-administered medications for this visit.   Facility-Administered Medications Ordered in Other Visits  Medication Dose Route Frequency Provider Last Rate Last Admin   fluorouracil (ADRUCIL) 4,350 mg in sodium chloride 0.9 % 63 mL chemo infusion  2,400 mg/m2 (Treatment Plan Recorded) Intravenous 1 day or 1 dose Truitt Merle, MD       irinotecan (CAMPTOSAR) 320 mg in sodium chloride 0.9 % 500 mL chemo infusion  180 mg/m2 (Treatment Plan Recorded) Intravenous Once Truitt Merle, MD 344 mL/hr at 09/02/22 1239 320 mg at 09/02/22 1239   leucovorin 728 mg in sodium chloride 0.9 % 250 mL infusion  400 mg/m2 (Treatment Plan Recorded) Intravenous Once Truitt Merle, MD 191 mL/hr at 09/02/22 1237 728 mg at 09/02/22 1237    PHYSICAL EXAMINATION: ECOG PERFORMANCE STATUS: 1 - Symptomatic but completely ambulatory  There were no vitals filed for this visit. Wt Readings from Last 3 Encounters:  09/02/22 147 lb 11.3 oz (67 kg)  08/19/22 147 lb 9.6 oz (67 kg)  08/05/22 148 lb 12.8 oz (67.5 kg)     ABDOMEN:(-) abdomen soft, non-tender and normal bowel sounds      LABORATORY DATA:  I have reviewed the data as  listed    Latest Ref Rng & Units 09/02/2022   10:05 AM 08/19/2022   11:12 AM 08/05/2022    9:19 AM  CBC  WBC 4.0 - 10.5 K/uL 3.6  4.5  4.6   Hemoglobin 13.0 - 17.0 g/dL 12.7  11.9  12.2   Hematocrit 39.0 - 52.0 % 36.5  35.4  35.8   Platelets 150 - 400 K/uL 336  326  321         Latest Ref Rng & Units 09/02/2022   10:05 AM 08/19/2022   11:12 AM 08/05/2022    9:19 AM  CMP  Glucose 70 - 99 mg/dL 96  92  95   BUN 6 - 20 mg/dL 10  10  8    Creatinine 0.61 - 1.24 mg/dL 0.72  0.66  0.68   Sodium 135 - 145 mmol/L 138  136  136   Potassium 3.5 - 5.1 mmol/L 3.7  3.6  3.4   Chloride 98 - 111 mmol/L 107  105  101   CO2 22 - 32 mmol/L 24  26  28    Calcium 8.9 - 10.3 mg/dL 9.4  9.3  9.2   Total Protein 6.5 - 8.1 g/dL 6.8  6.5  6.4   Total Bilirubin 0.3 - 1.2 mg/dL 0.7  0.5  0.7   Alkaline Phos 38 -  126 U/L 141  154  171   AST 15 - 41 U/L 23  24  36   ALT 0 - 44 U/L 36  53  79       RADIOGRAPHIC STUDIES: I have personally reviewed the radiological images as listed and agreed with the findings in the report. No results found.    No orders of the defined types were placed in this encounter.  All questions were answered. The patient knows to call the clinic with any problems, questions or concerns. No barriers to learning was detected. The total time spent in the appointment was 30 minutes.     Truitt Merle, MD 09/02/2022   Felicity Coyer, CMA, am acting as scribe for Truitt Merle, MD.   I have reviewed the above documentation for accuracy and completeness, and I agree with the above.

## 2022-09-01 NOTE — Assessment & Plan Note (Signed)
-  he was diagnosed with schizophrenia in his 20's, managed with medication, he is on disability. -he lives with his mother; he is able to do ADLs for himself, but not iADLS. They are both on disability. His mother also seems to have some kind of mental impairment

## 2022-09-01 NOTE — Assessment & Plan Note (Signed)
stage IIIB p(T3, N1aM0), MSS, KRAS/NRAS/BRAF wild type, lung and node metastasis in 04/2022  -diagnosed in 08/2021 -he completed 4 cycles adjuvant CAPOX 10/10/21 - 12/25/21 (though he somehow was still taking Xeloda through ~02/09/22 due to misunderstandings).  -he started FOLFIRI on 05/08/2022, Vectibix added from cycle 2, tolerating well overall -restaging CT scan image from August 02, 2022 showed good partial response in pulmonary metastasis, no other new lesions. -Will continue current treatment

## 2022-09-02 ENCOUNTER — Inpatient Hospital Stay: Payer: Medicaid Other

## 2022-09-02 ENCOUNTER — Other Ambulatory Visit: Payer: Self-pay

## 2022-09-02 ENCOUNTER — Inpatient Hospital Stay (HOSPITAL_BASED_OUTPATIENT_CLINIC_OR_DEPARTMENT_OTHER): Payer: Medicaid Other | Admitting: Hematology

## 2022-09-02 ENCOUNTER — Encounter: Payer: Self-pay | Admitting: Hematology

## 2022-09-02 VITALS — BP 120/79 | HR 78 | Temp 98.7°F | Resp 16 | Ht 69.0 in | Wt 147.7 lb

## 2022-09-02 DIAGNOSIS — F209 Schizophrenia, unspecified: Secondary | ICD-10-CM

## 2022-09-02 DIAGNOSIS — C186 Malignant neoplasm of descending colon: Secondary | ICD-10-CM

## 2022-09-02 DIAGNOSIS — Z5111 Encounter for antineoplastic chemotherapy: Secondary | ICD-10-CM | POA: Diagnosis not present

## 2022-09-02 DIAGNOSIS — C189 Malignant neoplasm of colon, unspecified: Secondary | ICD-10-CM

## 2022-09-02 DIAGNOSIS — Z95828 Presence of other vascular implants and grafts: Secondary | ICD-10-CM

## 2022-09-02 LAB — CBC WITH DIFFERENTIAL (CANCER CENTER ONLY)
Abs Immature Granulocytes: 0.01 10*3/uL (ref 0.00–0.07)
Basophils Absolute: 0 10*3/uL (ref 0.0–0.1)
Basophils Relative: 1 %
Eosinophils Absolute: 0.2 10*3/uL (ref 0.0–0.5)
Eosinophils Relative: 5 %
HCT: 36.5 % — ABNORMAL LOW (ref 39.0–52.0)
Hemoglobin: 12.7 g/dL — ABNORMAL LOW (ref 13.0–17.0)
Immature Granulocytes: 0 %
Lymphocytes Relative: 28 %
Lymphs Abs: 1 10*3/uL (ref 0.7–4.0)
MCH: 30.3 pg (ref 26.0–34.0)
MCHC: 34.8 g/dL (ref 30.0–36.0)
MCV: 87.1 fL (ref 80.0–100.0)
Monocytes Absolute: 0.3 10*3/uL (ref 0.1–1.0)
Monocytes Relative: 8 %
Neutro Abs: 2.1 10*3/uL (ref 1.7–7.7)
Neutrophils Relative %: 58 %
Platelet Count: 336 10*3/uL (ref 150–400)
RBC: 4.19 MIL/uL — ABNORMAL LOW (ref 4.22–5.81)
RDW: 14.8 % (ref 11.5–15.5)
WBC Count: 3.6 10*3/uL — ABNORMAL LOW (ref 4.0–10.5)
nRBC: 0 % (ref 0.0–0.2)

## 2022-09-02 LAB — CMP (CANCER CENTER ONLY)
ALT: 36 U/L (ref 0–44)
AST: 23 U/L (ref 15–41)
Albumin: 3.8 g/dL (ref 3.5–5.0)
Alkaline Phosphatase: 141 U/L — ABNORMAL HIGH (ref 38–126)
Anion gap: 7 (ref 5–15)
BUN: 10 mg/dL (ref 6–20)
CO2: 24 mmol/L (ref 22–32)
Calcium: 9.4 mg/dL (ref 8.9–10.3)
Chloride: 107 mmol/L (ref 98–111)
Creatinine: 0.72 mg/dL (ref 0.61–1.24)
GFR, Estimated: >60 mL/min (ref >60)
Glucose, Bld: 96 mg/dL (ref 70–99)
Potassium: 3.7 mmol/L (ref 3.5–5.1)
Sodium: 138 mmol/L (ref 135–145)
Total Bilirubin: 0.7 mg/dL (ref 0.3–1.2)
Total Protein: 6.8 g/dL (ref 6.5–8.1)

## 2022-09-02 LAB — MAGNESIUM: Magnesium: 1.9 mg/dL (ref 1.7–2.4)

## 2022-09-02 MED ORDER — SODIUM CHLORIDE 0.9 % IV SOLN
400.0000 mg/m2 | Freq: Once | INTRAVENOUS | Status: AC
  Administered 2022-09-02: 728 mg via INTRAVENOUS
  Filled 2022-09-02: qty 36.4

## 2022-09-02 MED ORDER — SODIUM CHLORIDE 0.9 % IV SOLN
180.0000 mg/m2 | Freq: Once | INTRAVENOUS | Status: AC
  Administered 2022-09-02: 320 mg via INTRAVENOUS
  Filled 2022-09-02: qty 1

## 2022-09-02 MED ORDER — PALONOSETRON HCL INJECTION 0.25 MG/5ML
0.2500 mg | Freq: Once | INTRAVENOUS | Status: AC
  Administered 2022-09-02: 0.25 mg via INTRAVENOUS
  Filled 2022-09-02: qty 5

## 2022-09-02 MED ORDER — ATROPINE SULFATE 1 MG/ML IV SOLN
0.5000 mg | Freq: Once | INTRAVENOUS | Status: AC | PRN
  Administered 2022-09-02: 0.5 mg via INTRAVENOUS
  Filled 2022-09-02: qty 1

## 2022-09-02 MED ORDER — SODIUM CHLORIDE 0.9 % IV SOLN
6.0000 mg/kg | Freq: Once | INTRAVENOUS | Status: AC
  Administered 2022-09-02: 400 mg via INTRAVENOUS
  Filled 2022-09-02: qty 20

## 2022-09-02 MED ORDER — SODIUM CHLORIDE 0.9 % IV SOLN
2400.0000 mg/m2 | INTRAVENOUS | Status: DC
  Administered 2022-09-02: 4350 mg via INTRAVENOUS
  Filled 2022-09-02: qty 87

## 2022-09-02 MED ORDER — SODIUM CHLORIDE 0.9 % IV SOLN
10.0000 mg | Freq: Once | INTRAVENOUS | Status: AC
  Administered 2022-09-02: 10 mg via INTRAVENOUS
  Filled 2022-09-02: qty 10

## 2022-09-02 MED ORDER — SODIUM CHLORIDE 0.9% FLUSH
10.0000 mL | Freq: Once | INTRAVENOUS | Status: AC
  Administered 2022-09-02: 10 mL

## 2022-09-02 MED ORDER — SODIUM CHLORIDE 0.9 % IV SOLN: Freq: Once | INTRAVENOUS | Status: AC

## 2022-09-02 NOTE — Patient Instructions (Signed)
[place]   Discharge Instructions: Thank you for choosing [facility] to provide your oncology and hematology care.   If you have a lab appointment with the [facility] [facility], please go directly to the [place] and check in at the registration area.   Wear comfortable clothing and clothing appropriate for easy access to any Portacath or PICC line.   We strive to give you quality time with your provider. You may need to reschedule your appointment if you arrive late (15 or more minutes).  Arriving late affects you and other patients whose appointments are after yours.  Also, if you miss three or more appointments without notifying the office, you may be dismissed from the clinic at the provider's discretion.      For prescription refill requests, have your pharmacy contact our office and allow 72 hours for refills to be completed.    Today you received the following chemotherapy and/or immunotherapy agents: Panitumumab (Vectibix), Irinotecan, Leucovorin, and Fluorouracil (Adrucil)      To help prevent nausea and vomiting after your treatment, we encourage you to take your nausea medication as directed.  BELOW ARE SYMPTOMS THAT SHOULD BE REPORTED IMMEDIATELY: *FEVER GREATER THAN 100.4 F (38 C) OR HIGHER *CHILLS OR SWEATING *NAUSEA AND VOMITING THAT IS NOT CONTROLLED WITH YOUR NAUSEA MEDICATION *UNUSUAL SHORTNESS OF BREATH *UNUSUAL BRUISING OR BLEEDING *URINARY PROBLEMS (pain or burning when urinating, or frequent urination) *BOWEL PROBLEMS (unusual diarrhea, constipation, pain near the anus) TENDERNESS IN MOUTH AND THROAT WITH OR WITHOUT PRESENCE OF ULCERS (sore throat, sores in mouth, or a toothache) UNUSUAL RASH, SWELLING OR PAIN  UNUSUAL VAGINAL DISCHARGE OR ITCHING   Items with * indicate a potential emergency and should be followed up as soon as possible or go to the Emergency Department if any problems should occur.  Please  show the CHEMOTHERAPY ALERT CARD or IMMUNOTHERAPY ALERT CARD at check-in to the Emergency Department and triage nurse.  Should you have questions after your visit or need to cancel or reschedule your appointment, please contact [facility]  Dept: [phone]  and follow the prompts.  Office hours are 8:00 a.m. to 4:30 p.m. Monday - Friday. Please note that voicemails left after 4:00 p.m. may not be returned until the following business day.  We are closed weekends and major holidays. You have access to a nurse at all times for urgent questions. Please call the main number to the clinic Dept: [phone] and follow the prompts.   For any non-urgent questions, you may also contact your provider using MyChart. We now offer e-Visits for anyone 39 and older to request care online for non-urgent symptoms. For details visit [email or web address].   Also download the MyChart app! Go to the app store, search "MyChart", open the app, select [place], and log in with your MyChart username and password.  The chemotherapy medication bag should finish at 46 hours, 96 hours, or 7 days. For example, if your pump is scheduled for 46 hours and it was put on at 4:00 p.m., it should finish at 2:00 p.m. the day it is scheduled to come off regardless of your appointment time.     Estimated time to finish at   1:30 PM  on Friday, January 5th.   If the display on your pump reads "Low Volume" and it is beeping, take the batteries out of the pump and come to the cancer center for it  to be taken off.   If the pump alarms go off prior to the pump reading "Low Volume" then call [phone] and someone can assist you.  If the plunger comes out and the chemotherapy medication is leaking out, please use your home chemo spill kit to clean up the spill. Do NOT use paper towels or other household products.  If you have problems or questions regarding your pump, please call either  [phone] (24 hours a day) or the cancer center Monday-Friday 8:00 a.m.- 4:30 p.m. at the clinic number and we will assist you. If you are unable to get assistance, then go to the nearest Emergency Department and ask the staff to contact the IV team for assistance.  [place]   Discharge Instructions: Thank you for choosing [facility] to provide your oncology and hematology care.   If you have a lab appointment with the [facility], please go directly to the [place] and check in at the registration area.   Wear comfortable clothing and clothing appropriate for easy access to any Portacath or PICC line.   We strive to give you quality time with your provider. You may need to reschedule your appointment if you arrive late (15 or more minutes).  Arriving late affects you and other patients whose appointments are after yours.  Also, if you miss three or more appointments without notifying the office, you may be dismissed from the clinic at the provider's discretion.      For prescription refill requests, have your pharmacy contact our office and allow 72 hours for refills to be completed.    Today you received the following chemotherapy and/or immunotherapy agents: Panitumumab (Vectibix), Irinotecan, Leucovorin, and Fluorouracil (Adrucil)      To help prevent nausea and vomiting after your treatment, we encourage you to take your nausea medication as directed.  BELOW ARE SYMPTOMS THAT SHOULD BE REPORTED IMMEDIATELY: *FEVER GREATER THAN 100.4 F (38 C) OR HIGHER *CHILLS OR SWEATING *NAUSEA AND VOMITING THAT IS NOT CONTROLLED WITH YOUR NAUSEA MEDICATION *UNUSUAL SHORTNESS OF BREATH *UNUSUAL BRUISING OR BLEEDING *URINARY PROBLEMS (pain or burning when urinating, or frequent urination) *BOWEL PROBLEMS (unusual diarrhea, constipation, pain near the anus) TENDERNESS IN MOUTH AND THROAT WITH OR WITHOUT PRESENCE OF ULCERS (sore throat,  sores in mouth, or a toothache) UNUSUAL RASH, SWELLING OR PAIN  UNUSUAL VAGINAL DISCHARGE OR ITCHING   Items with * indicate a potential emergency and should be followed up as soon as possible or go to the Emergency Department if any problems should occur.  Please show the CHEMOTHERAPY ALERT CARD or IMMUNOTHERAPY ALERT CARD at check-in to the Emergency Department and triage nurse.  Should you have questions after your visit or need to cancel or reschedule your appointment, please contact [facility]  Dept: [phone]  and follow the prompts.  Office hours are 8:00 a.m. to 4:30 p.m. Monday - Friday. Please note that voicemails left after 4:00 p.m. may not be returned until the following business day.  We are closed weekends and major holidays. You have access to a nurse at all times for urgent questions. Please call the main number to the clinic Dept: [phone] and follow the prompts.   For any non-urgent questions, you may also contact your provider using MyChart. We now offer e-Visits for anyone 24 and older to request care online for non-urgent symptoms. For details visit [email or web address].   Also download the  MyChart app! Go to the app store, search "MyChart", open the app, select [place], and log in with your MyChart username and password.  The chemotherapy medication bag should finish at 46 hours, 96 hours, or 7 days. For example, if your pump is scheduled for 46 hours and it was put on at 4:00 p.m., it should finish at 2:00 p.m. the day it is scheduled to come off regardless of your appointment time.     Estimated time to finish at    12:00 PM     on Friday, January 5th.   If the display on your pump reads "Low Volume" and it is beeping, take the batteries out of the pump and come to the cancer center for it to be taken off.   If the pump alarms go off prior to the pump reading "Low Volume" then call [phone] and someone can assist  you.  If the plunger comes out and the chemotherapy medication is leaking out, please use your home chemo spill kit to clean up the spill. Do NOT use paper towels or other household products.  If you have problems or questions regarding your pump, please call either [phone] (24 hours a day) or the cancer center Monday-Friday 8:00 a.m.- 4:30 p.m. at the clinic number and we will assist you. If you are unable to get assistance, then go to the nearest Emergency Department and ask the staff to contact the IV team for assistance.

## 2022-09-03 ENCOUNTER — Telehealth: Payer: Self-pay | Admitting: Hematology

## 2022-09-03 NOTE — Telephone Encounter (Signed)
Spoke with patient mother confirming upcoming appointments, will ask for updated calendar 2/2

## 2022-09-04 ENCOUNTER — Inpatient Hospital Stay: Payer: Medicaid Other | Attending: Hematology

## 2022-09-04 ENCOUNTER — Other Ambulatory Visit: Payer: Self-pay

## 2022-09-04 VITALS — BP 110/75 | HR 104 | Temp 100.2°F | Resp 18

## 2022-09-04 DIAGNOSIS — Z5111 Encounter for antineoplastic chemotherapy: Secondary | ICD-10-CM | POA: Insufficient documentation

## 2022-09-04 DIAGNOSIS — C7800 Secondary malignant neoplasm of unspecified lung: Secondary | ICD-10-CM | POA: Insufficient documentation

## 2022-09-04 DIAGNOSIS — C772 Secondary and unspecified malignant neoplasm of intra-abdominal lymph nodes: Secondary | ICD-10-CM | POA: Diagnosis not present

## 2022-09-04 DIAGNOSIS — Z79899 Other long term (current) drug therapy: Secondary | ICD-10-CM | POA: Diagnosis not present

## 2022-09-04 DIAGNOSIS — C186 Malignant neoplasm of descending colon: Secondary | ICD-10-CM | POA: Diagnosis present

## 2022-09-04 MED ORDER — HEPARIN SOD (PORK) LOCK FLUSH 100 UNIT/ML IV SOLN
500.0000 [IU] | Freq: Once | INTRAVENOUS | Status: AC | PRN
  Administered 2022-09-04: 500 [IU]

## 2022-09-04 MED ORDER — SODIUM CHLORIDE 0.9% FLUSH
10.0000 mL | INTRAVENOUS | Status: DC | PRN
  Administered 2022-09-04: 10 mL

## 2022-09-16 MED FILL — Dexamethasone Sodium Phosphate Inj 100 MG/10ML: INTRAMUSCULAR | Qty: 1 | Status: AC

## 2022-09-16 NOTE — Progress Notes (Unsigned)
[place]   [phone] [phone]   Clinic Follow up Note   Patient Care Team: Medicine, [facility] as PCP - General Clovis Riley, MD as Consulting Physician (General Surgery) Truitt Merle, MD as Consulting Physician (Oncology)  Date of Service:  09/17/2022  CHIEF COMPLAINT: f/u of  metastatic colon cancer    CURRENT THERAPY:  metastatic colon cancer      ASSESSMENT:  Matthew [name] is a 57 y.o. 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  -he started FOLFIRI on 05/08/2022, Vectibix added from cycle 2, tolerating well overall -restaging CT scan image from August 02, 2022 showed good partial response in pulmonary metastasis, no other new lesions. -Will continue current treatment, will consider switching to maintenance therapy with 5-fu and vectibix after next restaging scan, or sooner if he develops significant neuropathy. -He is tolerating treatment very well, will continue.  Schizophrenia [facility]) he was diagnosed with schizophrenia in his 20's, managed with medication, he is on disability. -he lives with his mother; he is able to do ADLs for himself, but not iADLS. They are both on disability. His mother also seems to have some kind of mental impairment     PLAN: -lab reviewed  -proceed with C9 Folfiri+ Vectibix today -lab/flush, and f/u and FOLFIRI+VECTIBIX in 2 wks     SUMMARY OF ONCOLOGIC HISTORY: Oncology History Overview Note   Cancer Staging  Cancer of left colon [facility]) Staging form: Colon and Rectum, AJCC 8th Edition - Pathologic stage from 08/08/2021: Stage IIIB (pT3, pN1a, cM0) - Signed by Truitt Merle, MD on 08/26/2021    Cancer of left colon ([place])  04/01/2020  Imaging   IMPRESSION: 1. Gallbladder decompressed bowel also partially calcified gallstones. No pericholecystic inflammation though if there is persisting clinical concern for cholecystitis right upper quadrant ultrasound could be obtained. 2. Circumferential thickening of the distal thoracic esophagus. Could reflect features of esophagitis. Correlate with clinical symptoms and consider endoscopy as clinically warranted. 3. Additional segmental thickening of the mid to distal sigmoid with focal narrowing. No acute surrounding inflammation or resulting obstruction. Findings are nonspecific, and could reflect sequela of prior inflammation/infection. However, recommend correlation with colonoscopy if not recently performed. 4. Mild circumferential bladder wall thickening and indentation of the bladder base by an enlarged prostate. Possibly sequela of chronic outlet obstruction though could correlate with urinalysis to exclude cystitis. 5. Aortic Atherosclerosis (ICD10-I70.0).   08/06/2021 Imaging   IMPRESSION: Sigmoid colonic perforation with small free intraperitoneal gas and infiltration of the mesenteric and omental fat in keeping with changes of peritonitis.   Long segment inflammatory stranding of the sigmoid colon in keeping with a severe infectious or inflammatory colitis. This terminates an area of irregular mural thickening, infiltrative soft tissue within the colonic mesentery, and focal dystrophic calcification. This may represent a chronic inflammatory process, however, a perforated malignancy could appear similarly. There are 2 separate points of perforation which again raise the question of an underlying malignancy.   7.1 cm gas and fluid containing pericolonic abscess within the sigmoid mesentery.   Marked inflammatory change of the terminal ileum adjacent to the pericolonic abscess with resultant small bowel obstruction. Fluid within the distal esophagus likely relates to  gastroesophageal reflux the setting of vomiting.  Aortic Atherosclerosis (ICD10-I70.0).   08/08/2021 Cancer Staging   Staging form: Colon and Rectum, AJCC 8th Edition - Pathologic stage from 08/08/2021: Stage IIIB (pT3, pN1a, cM0) - Signed by Truitt Merle, MD on 08/26/2021 Stage prefix: Initial diagnosis Total positive nodes: 1 Histologic grading system: 4 grade system Histologic grade (G): G2 Residual tumor (R): R0 - None   08/08/2021 Definitive Surgery   FINAL MICROSCOPIC DIAGNOSIS:   A. COLON, SIGMOID, PARTIAL COLECTOMY:  - Invasive moderately differentiated adenocarcinoma.  - Metastatic carcinoma involving one of twelve lymph nodes (1/12).  - See oncology table below.   ADDENDUM:  Mismatch Repair Protein (IHC)  SUMMARY INTERPRETATION: NORMAL    08/14/2021 Imaging   EXAM: CT ABDOMEN AND PELVIS WITH CONTRAST  IMPRESSION: 1. Post recent sigmoid colectomy with left lower quadrant colostomy. Two small residual foci of air within the pelvic mesentery with mild adjacent thickening, but no abscess or drainable collection. Trace non organized free fluid and stranding in the pelvis. 2. Short segment of small bowel wall thickening and inflammation in the pelvis involving the distal ileum, likely reactive. 3. Dilated distal esophagus, stomach, and small bowel, without discrete transition point, favoring postoperative ileus. 4. Small bilateral pleural effusions and compressive atelectasis. 5. Heterogeneous partially enhancing 14 mm lymph node in the retroperitoneum at the aortoiliac bifurcation, not significantly changed from prior exam. Suspected additional lymph nodes in the anterior common iliac space, not significantly changed from prior exam. Recommend attention at follow-up. 6. Additional chronic findings as described.     08/15/2021 Imaging   EXAM: CT CHEST WITH CONTRAST  IMPRESSION: 1. No evidence of thoracic metastasis. 2. Bilateral small layering pleural effusions with  passive atelectasis   08/26/2021 Initial Diagnosis   Cancer of left colon ([place])   10/10/2021 - 12/12/2021 Chemotherapy   Patient is on Treatment Plan : COLORECTAL Xelox ([place]ox) q21d     05/28/2022 - 05/28/2022 Chemotherapy   Patient is on Treatment Plan : COLORECTAL FOLFIRI + Bevacizumab q14d     05/28/2022 -  Chemotherapy   Patient is on Treatment Plan : COLORECTAL FOLFIRI + Panitumumab q14d     07/31/2022 Imaging    IMPRESSION: Decreased bilateral pulmonary metastases.   Stable mild abdominal lymphadenopathy.   No new or progressive metastatic disease within the chest, abdomen, or pelvis.      INTERVAL HISTORY:  Matthew [name] is here for a follow up of metastatic colon cancer     He was last seen by  me on 09/02/2022 He presents to the clinic accompanied by mother. Pt mother stated he has been drinking a lot of juice.Pt denies having pain. Pt denies having stomach issues , but have some diarrhea. Pt reports that his appetite is good. Pt mother has stated that the pt has verbally stated he didn't want to live.Pt denies having numbness and in finger tips.    All other systems were reviewed with the patient and are negative.  MEDICAL HISTORY:  Past Medical History:  Diagnosis Date   Cancer ([place])    Schizophrenia [facility])     SURGICAL HISTORY: Past Surgical History:  Procedure Laterality Date   BRONCHIAL BIOPSY  05/11/2022   Procedure: BRONCHIAL BIOPSIES;  Surgeon: Collene Gobble, MD;  Location: [facility] ENDOSCOPY;  Service: Pulmonary;;   BRONCHIAL BRUSHINGS  05/11/2022   Procedure: BRONCHIAL BRUSHINGS;  Surgeon: Collene Gobble, MD;  Location: [facility] ENDOSCOPY;  Service: Pulmonary;;   BRONCHIAL NEEDLE ASPIRATION BIOPSY  05/11/2022   Procedure: BRONCHIAL NEEDLE ASPIRATION BIOPSIES;  Surgeon: Lamonte Sakai,  Rose Fillers, MD;  Location: [facility] ENDOSCOPY;  Service: Pulmonary;;   BRONCHIAL WASHINGS  05/11/2022   Procedure: BRONCHIAL WASHINGS;  Surgeon: Collene Gobble, MD;  Location: [facility] ENDOSCOPY;   Service: Pulmonary;;   IR IMAGING GUIDED PORT INSERTION  05/25/2022   LAPAROTOMY N/A 08/08/2021   Procedure: EXPLORATORY LAPAROTOMY;  Surgeon: Clovis Riley, MD;  Location: [place];  Service: General;  Laterality: N/A;   PARTIAL COLECTOMY N/A 08/08/2021   Procedure: PARTIAL COLECTOMY WITH COLOSTOMY;  Surgeon: Clovis Riley, MD;  Location: [place];  Service: General;  Laterality: N/A;   VIDEO BRONCHOSCOPY WITH RADIAL ENDOBRONCHIAL ULTRASOUND  05/11/2022   Procedure: VIDEO BRONCHOSCOPY WITH RADIAL ENDOBRONCHIAL ULTRASOUND;  Surgeon: Collene Gobble, MD;  Location: [facility] ENDOSCOPY;  Service: Pulmonary;;    I have reviewed the social history and family history with the patient and they are unchanged from previous note.  ALLERGIES:  has No Known Allergies.  MEDICATIONS:  Current Outpatient Medications  Medication Sig Dispense Refill   acetaminophen (TYLENOL) 500 MG tablet Take 2 tablets (1,000 mg total) by mouth every 6 (six) hours as needed for mild pain or moderate pain.  0   aspirin EC 81 MG tablet Take 81 mg by mouth daily as needed (for pain or headaches).      benztropine (COGENTIN) 1 MG tablet Take 1 mg at bedtime by mouth.     clindamycin (CLINDAGEL) 1 % gel Apply topically 2 (two) times daily. To skin rash on face and upper body 30 g 0   ferrous sulfate 325 (65 FE) MG EC tablet Take 1 tablet (325 mg total) by mouth daily. (Patient not taking: Reported on 05/07/2022) 30 tablet 3   hydrocortisone cream 1 % Apply 1 Application topically 2 (two) times daily as needed for itching. For rash 30 g 2   Multiple Vitamin (MULTIVITAMIN WITH MINERALS) TABS tablet Take 1 tablet by mouth daily.     ondansetron (ZOFRAN) 8 MG tablet Take 1 tablet (8 mg total) by mouth every 8 (eight) hours as needed for nausea or vomiting. 30 tablet 0   paliperidone (INVEGA SUSTENNA) 156 MG/ML SUSP injection Inject 156 mg every 30 (thirty) days into the muscle.     polyethylene glycol (MIRALAX / GLYCOLAX) 17 g packet Take 17  g by mouth daily as needed for mild constipation or moderate constipation. (Patient not taking: Reported on 05/07/2022)  0   prochlorperazine (COMPAZINE) 10 MG tablet Take 1 tablet (10 mg total) by mouth every 6 (six) hours as needed for nausea or vomiting. 30 tablet 0   No current facility-administered medications for this visit.    PHYSICAL EXAMINATION: ECOG PERFORMANCE STATUS: 1 - Symptomatic but completely ambulatory  Vitals:   09/17/22 0946  BP: 104/76  Pulse: 68  Resp: 17  Temp: 97.8 F (36.6 C)  SpO2: 97%   Wt Readings from Last 3 Encounters:  09/17/22 147 lb 14.4 oz (67.1 kg)  09/02/22 147 lb 11.3 oz (67 kg)  08/19/22 147 lb 9.6 oz (67 kg)     GENERAL:alert, no distress and comfortable SKIN: skin color normal, no rashes or significant lesions EYES: normal, Conjunctiva are pink and non-injected, sclera clear  NEURO: alert & oriented x 3 with fluent speech   LABORATORY DATA:  I have reviewed the data as listed    Latest Ref Rng & Units 09/17/2022    9:26 AM 09/02/2022   10:05 AM 08/19/2022   11:12 AM  CBC  WBC 4.0 - 10.5 K/uL  4.2  3.6  4.5   Hemoglobin 13.0 - 17.0 g/dL 12.2  12.7  11.9   Hematocrit 39.0 - 52.0 % 35.4  36.5  35.4   Platelets 150 - 400 K/uL 336  336  326         Latest Ref Rng & Units 09/17/2022    9:26 AM 09/02/2022   10:05 AM 08/19/2022   11:12 AM  CMP  Glucose 70 - 99 mg/dL 102  96  92   BUN 6 - 20 mg/dL 10  10  10    Creatinine 0.61 - 1.24 mg/dL 0.75  0.72  0.66   Sodium 135 - 145 mmol/L 141  138  136   Potassium 3.5 - 5.1 mmol/L 3.5  3.7  3.6   Chloride 98 - 111 mmol/L 106  107  105   CO2 22 - 32 mmol/L 23  24  26    Calcium 8.9 - 10.3 mg/dL 9.0  9.4  9.3   Total Protein 6.5 - 8.1 g/dL 7.0  6.8  6.5   Total Bilirubin 0.3 - 1.2 mg/dL 0.7  0.7  0.5   Alkaline Phos 38 - 126 U/L 139  141  154   AST 15 - 41 U/L 50  23  24   ALT 0 - 44 U/L 56  36  53       RADIOGRAPHIC STUDIES: I have personally reviewed the radiological images as listed  and agreed with the findings in the report. No results found.    Orders Placed This Encounter  Procedures   CBC with Differential ([place] Only)    Standing Status:   Future    Standing Expiration Date:   10/01/2023   CMP ([place] only)    Standing Status:   Future    Standing Expiration Date:   10/01/2023   Magnesium    Standing Status:   Future    Standing Expiration Date:   10/01/2023   CBC with Differential (Cancer Center Only)    Standing Status:   Future    Standing Expiration Date:   10/15/2023   CMP ([place] only)    Standing Status:   Future    Standing Expiration Date:   10/15/2023   Magnesium    Standing Status:   Future    Standing Expiration Date:   10/15/2023   CBC with Differential (Cancer Center Only)    Standing Status:   Future    Standing Expiration Date:   10/29/2023   CMP ([place] only)    Standing Status:   Future    Standing Expiration Date:   10/29/2023   Magnesium    Standing Status:   Future    Standing Expiration Date:   10/29/2023   All questions were answered. The patient knows to call the clinic with any problems, questions or concerns. No barriers to learning was detected. The total time spent in the appointment was 30 minutes.     Truitt Merle, MD 09/17/2022   Felicity Coyer, CMA, am acting as scribe for Truitt Merle, MD.   I have reviewed the above documentation for accuracy and completeness, and I agree with the above.

## 2022-09-17 ENCOUNTER — Encounter: Payer: Self-pay | Admitting: Hematology

## 2022-09-17 ENCOUNTER — Inpatient Hospital Stay (HOSPITAL_BASED_OUTPATIENT_CLINIC_OR_DEPARTMENT_OTHER): Payer: Medicaid Other | Admitting: Hematology

## 2022-09-17 ENCOUNTER — Inpatient Hospital Stay: Payer: Medicaid Other

## 2022-09-17 ENCOUNTER — Other Ambulatory Visit: Payer: Self-pay

## 2022-09-17 VITALS — BP 120/88 | HR 66 | Temp 98.4°F | Resp 16

## 2022-09-17 VITALS — BP 104/76 | HR 68 | Temp 97.8°F | Resp 17 | Ht 69.0 in | Wt 147.9 lb

## 2022-09-17 DIAGNOSIS — C186 Malignant neoplasm of descending colon: Secondary | ICD-10-CM

## 2022-09-17 DIAGNOSIS — F209 Schizophrenia, unspecified: Secondary | ICD-10-CM | POA: Diagnosis not present

## 2022-09-17 DIAGNOSIS — Z5111 Encounter for antineoplastic chemotherapy: Secondary | ICD-10-CM | POA: Diagnosis not present

## 2022-09-17 DIAGNOSIS — C189 Malignant neoplasm of colon, unspecified: Secondary | ICD-10-CM

## 2022-09-17 DIAGNOSIS — Z95828 Presence of other vascular implants and grafts: Secondary | ICD-10-CM

## 2022-09-17 LAB — CMP (CANCER CENTER ONLY)
ALT: 56 U/L — ABNORMAL HIGH (ref 0–44)
AST: 50 U/L — ABNORMAL HIGH (ref 15–41)
Albumin: 3.6 g/dL (ref 3.5–5.0)
Alkaline Phosphatase: 139 U/L — ABNORMAL HIGH (ref 38–126)
Anion gap: 12 (ref 5–15)
BUN: 10 mg/dL (ref 6–20)
CO2: 23 mmol/L (ref 22–32)
Calcium: 9 mg/dL (ref 8.9–10.3)
Chloride: 106 mmol/L (ref 98–111)
Creatinine: 0.75 mg/dL (ref 0.61–1.24)
GFR, Estimated: >60 mL/min (ref >60)
Glucose, Bld: 102 mg/dL — ABNORMAL HIGH (ref 70–99)
Potassium: 3.5 mmol/L (ref 3.5–5.1)
Sodium: 141 mmol/L (ref 135–145)
Total Bilirubin: 0.7 mg/dL (ref 0.3–1.2)
Total Protein: 7 g/dL (ref 6.5–8.1)

## 2022-09-17 LAB — CBC WITH DIFFERENTIAL (CANCER CENTER ONLY)
Abs Immature Granulocytes: 0.02 10*3/uL (ref 0.00–0.07)
Basophils Absolute: 0 10*3/uL (ref 0.0–0.1)
Basophils Relative: 1 %
Eosinophils Absolute: 0.2 10*3/uL (ref 0.0–0.5)
Eosinophils Relative: 4 %
HCT: 35.4 % — ABNORMAL LOW (ref 39.0–52.0)
Hemoglobin: 12.2 g/dL — ABNORMAL LOW (ref 13.0–17.0)
Immature Granulocytes: 1 %
Lymphocytes Relative: 22 %
Lymphs Abs: 0.9 10*3/uL (ref 0.7–4.0)
MCH: 29.8 pg (ref 26.0–34.0)
MCHC: 34.5 g/dL (ref 30.0–36.0)
MCV: 86.3 fL (ref 80.0–100.0)
Monocytes Absolute: 0.3 10*3/uL (ref 0.1–1.0)
Monocytes Relative: 7 %
Neutro Abs: 2.8 10*3/uL (ref 1.7–7.7)
Neutrophils Relative %: 65 %
Platelet Count: 336 10*3/uL (ref 150–400)
RBC: 4.1 MIL/uL — ABNORMAL LOW (ref 4.22–5.81)
RDW: 14.7 % (ref 11.5–15.5)
WBC Count: 4.2 10*3/uL (ref 4.0–10.5)
nRBC: 0 % (ref 0.0–0.2)

## 2022-09-17 LAB — MAGNESIUM: Magnesium: 2.1 mg/dL (ref 1.7–2.4)

## 2022-09-17 MED ORDER — ATROPINE SULFATE 1 MG/ML IV SOLN
0.5000 mg | Freq: Once | INTRAVENOUS | Status: AC | PRN
  Administered 2022-09-17: 0.5 mg via INTRAVENOUS
  Filled 2022-09-17: qty 1

## 2022-09-17 MED ORDER — SODIUM CHLORIDE 0.9 % IV SOLN
6.0000 mg/kg | Freq: Once | INTRAVENOUS | Status: AC
  Administered 2022-09-17: 400 mg via INTRAVENOUS
  Filled 2022-09-17: qty 20

## 2022-09-17 MED ORDER — SODIUM CHLORIDE 0.9 % IV SOLN
10.0000 mg | Freq: Once | INTRAVENOUS | Status: AC
  Administered 2022-09-17: 10 mg via INTRAVENOUS
  Filled 2022-09-17: qty 10

## 2022-09-17 MED ORDER — SODIUM CHLORIDE 0.9 % IV SOLN: Freq: Once | INTRAVENOUS | Status: AC

## 2022-09-17 MED ORDER — SODIUM CHLORIDE 0.9% FLUSH: 10.0000 mL | INTRAVENOUS | Status: DC | PRN

## 2022-09-17 MED ORDER — SODIUM CHLORIDE 0.9 % IV SOLN
2400.0000 mg/m2 | INTRAVENOUS | Status: DC
  Administered 2022-09-17: 4350 mg via INTRAVENOUS
  Filled 2022-09-17: qty 87

## 2022-09-17 MED ORDER — HEPARIN SOD (PORK) LOCK FLUSH 100 UNIT/ML IV SOLN: 500.0000 [IU] | Freq: Once | INTRAVENOUS | Status: DC | PRN

## 2022-09-17 MED ORDER — SODIUM CHLORIDE 0.9 % IV SOLN
400.0000 mg/m2 | Freq: Once | INTRAVENOUS | Status: AC
  Administered 2022-09-17: 728 mg via INTRAVENOUS
  Filled 2022-09-17: qty 36.4

## 2022-09-17 MED ORDER — SODIUM CHLORIDE 0.9% FLUSH
10.0000 mL | Freq: Once | INTRAVENOUS | Status: AC
  Administered 2022-09-17: 10 mL

## 2022-09-17 MED ORDER — PALONOSETRON HCL INJECTION 0.25 MG/5ML
0.2500 mg | Freq: Once | INTRAVENOUS | Status: AC
  Administered 2022-09-17: 0.25 mg via INTRAVENOUS
  Filled 2022-09-17: qty 5

## 2022-09-17 MED ORDER — SODIUM CHLORIDE 0.9 % IV SOLN
180.0000 mg/m2 | Freq: Once | INTRAVENOUS | Status: AC
  Administered 2022-09-17: 320 mg via INTRAVENOUS
  Filled 2022-09-17: qty 1

## 2022-09-17 NOTE — Patient Instructions (Addendum)
[facility]  Discharge Instructions: Thank you for choosing [facility] to provide your oncology and hematology care.   If you have a lab appointment with the [facility], please go directly to the [facility] and check in at the registration area.   Wear comfortable clothing and clothing appropriate for easy access to any Portacath or PICC line.   We strive to give you quality time with your provider. You may need to reschedule your appointment if you arrive late (15 or more minutes).  Arriving late affects you and other patients whose appointments are after yours.  Also, if you miss three or more appointments without notifying the office, you may be dismissed from the clinic at the provider's discretion.      For prescription refill requests, have your pharmacy contact our office and allow 72 hours for refills to be completed.    Today you received the following chemotherapy and/or immunotherapy agents: Vectibix, Irinotecan, Leucovorin, and Fluorouracil.   To help prevent nausea and vomiting after your treatment, we encourage you to take your nausea medication as directed.  BELOW ARE SYMPTOMS THAT SHOULD BE REPORTED IMMEDIATELY: *FEVER GREATER THAN 100.4 F (38 C) OR HIGHER *CHILLS OR SWEATING *NAUSEA AND VOMITING THAT IS NOT CONTROLLED WITH YOUR NAUSEA MEDICATION *UNUSUAL SHORTNESS OF BREATH *UNUSUAL BRUISING OR BLEEDING *URINARY PROBLEMS (pain or burning when urinating, or frequent urination) *BOWEL PROBLEMS (unusual diarrhea, constipation, pain near the anus) TENDERNESS IN MOUTH AND THROAT WITH OR WITHOUT PRESENCE OF ULCERS (sore throat, sores in mouth, or a toothache) UNUSUAL RASH, SWELLING OR PAIN  UNUSUAL VAGINAL DISCHARGE OR ITCHING   Items with * indicate a potential emergency and should be followed up as soon as possible or go to the Emergency Department if any problems should occur.  Please show the CHEMOTHERAPY ALERT  CARD or IMMUNOTHERAPY ALERT CARD at check-in to the Emergency Department and triage nurse.  Should you have questions after your visit or need to cancel or reschedule your appointment, please contact [facility]  Dept: [phone]  and follow the prompts.  Office hours are 8:00 a.m. to 4:30 p.m. Monday - Friday. Please note that voicemails left after 4:00 p.m. may not be returned until the following business day.  We are closed weekends and major holidays. You have access to a nurse at all times for urgent questions. Please call the main number to the clinic Dept: [phone] and follow the prompts.   For any non-urgent questions, you may also contact your provider using MyChart. We now offer e-Visits for anyone 30 and older to request care online for non-urgent symptoms. For details visit [email or web address].   Also download the MyChart app! Go to the app store, search "MyChart", open the app, select [facility], and log in with your MyChart username and password.  The chemotherapy medication bag should finish at 46 hours, 96 hours, or 7 days. For example, if your pump is scheduled for 46 hours and it was put on at 4:00 p.m., it should finish at 2:00 p.m. the day it is scheduled to come off regardless of your appointment time.     Estimated time to finish at: 12:15 pm   If the display on your pump reads "Low Volume" and it is beeping, take the batteries out of the pump and come to the cancer center for it to be taken off.   If the pump alarms go off prior  to the pump reading "Low Volume" then call [phone] and someone can assist you.  If the plunger comes out and the chemotherapy medication is leaking out, please use your home chemo spill kit to clean up the spill. Do NOT use paper towels or other household products.  If you have problems or questions regarding your pump, please call either [phone] (24 hours a day) or the cancer center  Monday-Friday 8:00 a.m.- 4:30 p.m. at the clinic number and we will assist you. If you are unable to get assistance, then go to the nearest Emergency Department and ask the staff to contact the IV team for assistance.

## 2022-09-17 NOTE — Assessment & Plan Note (Signed)
stage IIIB p(T3, N1aM0), MSS, KRAS/NRAS/BRAF wild type, lung and node metastasis in 04/2022  -diagnosed in 08/2021 -he completed 4 cycles adjuvant CAPOX 10/10/21 - 12/25/21 (though he somehow was still taking Xeloda through ~02/09/22 due to misunderstandings).  -he started FOLFIRI on 05/08/2022, Vectibix added from cycle 2, tolerating well overall -restaging CT scan image from August 02, 2022 showed good partial response in pulmonary metastasis, no other new lesions. -Will continue current treatment, will consider switching to maintenance therapy with 5-fu and vectibix after next restaging scan, or sooner if he develops significant neuropathy.

## 2022-09-17 NOTE — Assessment & Plan Note (Signed)
he was diagnosed with schizophrenia in his 20's, managed with medication, he is on disability. -he lives with his mother; he is able to do ADLs for himself, but not iADLS. They are both on disability. His mother also seems to have some kind of mental impairment

## 2022-09-19 ENCOUNTER — Inpatient Hospital Stay: Payer: Medicaid Other

## 2022-09-19 VITALS — BP 123/79 | HR 99 | Temp 97.7°F | Resp 16

## 2022-09-19 DIAGNOSIS — C186 Malignant neoplasm of descending colon: Secondary | ICD-10-CM

## 2022-09-19 DIAGNOSIS — Z5111 Encounter for antineoplastic chemotherapy: Secondary | ICD-10-CM | POA: Diagnosis not present

## 2022-09-19 MED ORDER — SODIUM CHLORIDE 0.9% FLUSH
10.0000 mL | INTRAVENOUS | Status: DC | PRN
  Administered 2022-09-19: 10 mL

## 2022-09-19 MED ORDER — HEPARIN SOD (PORK) LOCK FLUSH 100 UNIT/ML IV SOLN
500.0000 [IU] | Freq: Once | INTRAVENOUS | Status: AC | PRN
  Administered 2022-09-19: 500 [IU]

## 2022-09-20 ENCOUNTER — Other Ambulatory Visit: Payer: Self-pay

## 2022-09-29 MED FILL — Dexamethasone Sodium Phosphate Inj 100 MG/10ML: INTRAMUSCULAR | Qty: 1 | Status: AC

## 2022-09-29 NOTE — Assessment & Plan Note (Signed)
stage IIIB p(T3, N1aM0), MSS, KRAS/NRAS/BRAF wild type, lung and node metastasis in 04/2022  -diagnosed in 08/2021 -he completed 4 cycles adjuvant CAPOX 10/10/21 - 12/25/21 (though he somehow was still taking Xeloda through ~02/09/22 due to misunderstandings).  -he started FOLFIRI on 05/08/2022, Vectibix added from cycle 2, tolerating well overall -restaging CT scan image from August 02, 2022 showed good partial response in pulmonary metastasis, no other new lesions. -Will continue current treatment, will consider switching to maintenance therapy with 5-fu and vectibix after next restaging scan, or sooner if he develops significant neuropathy. -He is tolerating treatment very well, will continue.

## 2022-09-29 NOTE — Assessment & Plan Note (Signed)
he was diagnosed with schizophrenia in his 20's, managed with medication, he is on disability. -he lives with his mother; he is able to do ADLs for himself, but not iADLS. They are both on disability. His mother also seems to have some kind of mental impairment

## 2022-09-30 ENCOUNTER — Encounter: Payer: Self-pay | Admitting: Hematology

## 2022-09-30 ENCOUNTER — Other Ambulatory Visit: Payer: Self-pay

## 2022-09-30 ENCOUNTER — Inpatient Hospital Stay: Payer: Medicaid Other

## 2022-09-30 ENCOUNTER — Other Ambulatory Visit: Payer: Self-pay | Admitting: Hematology

## 2022-09-30 ENCOUNTER — Inpatient Hospital Stay (HOSPITAL_BASED_OUTPATIENT_CLINIC_OR_DEPARTMENT_OTHER): Payer: Medicaid Other | Admitting: Hematology

## 2022-09-30 VITALS — BP 108/75 | HR 97 | Temp 98.3°F | Resp 18 | Ht 69.0 in | Wt 148.8 lb

## 2022-09-30 DIAGNOSIS — C186 Malignant neoplasm of descending colon: Secondary | ICD-10-CM

## 2022-09-30 DIAGNOSIS — Z95828 Presence of other vascular implants and grafts: Secondary | ICD-10-CM

## 2022-09-30 DIAGNOSIS — F209 Schizophrenia, unspecified: Secondary | ICD-10-CM | POA: Diagnosis not present

## 2022-09-30 DIAGNOSIS — Z5111 Encounter for antineoplastic chemotherapy: Secondary | ICD-10-CM | POA: Diagnosis not present

## 2022-09-30 DIAGNOSIS — C189 Malignant neoplasm of colon, unspecified: Secondary | ICD-10-CM

## 2022-09-30 LAB — MAGNESIUM: Magnesium: 1.5 mg/dL — ABNORMAL LOW (ref 1.7–2.4)

## 2022-09-30 LAB — CBC WITH DIFFERENTIAL (CANCER CENTER ONLY)
Abs Immature Granulocytes: 0.01 10*3/uL (ref 0.00–0.07)
Basophils Absolute: 0 10*3/uL (ref 0.0–0.1)
Basophils Relative: 1 %
Eosinophils Absolute: 0.2 10*3/uL (ref 0.0–0.5)
Eosinophils Relative: 5 %
HCT: 35.4 % — ABNORMAL LOW (ref 39.0–52.0)
Hemoglobin: 11.9 g/dL — ABNORMAL LOW (ref 13.0–17.0)
Immature Granulocytes: 0 %
Lymphocytes Relative: 20 %
Lymphs Abs: 0.9 10*3/uL (ref 0.7–4.0)
MCH: 29 pg (ref 26.0–34.0)
MCHC: 33.6 g/dL (ref 30.0–36.0)
MCV: 86.3 fL (ref 80.0–100.0)
Monocytes Absolute: 0.4 10*3/uL (ref 0.1–1.0)
Monocytes Relative: 8 %
Neutro Abs: 2.8 10*3/uL (ref 1.7–7.7)
Neutrophils Relative %: 66 %
Platelet Count: 333 10*3/uL (ref 150–400)
RBC: 4.1 MIL/uL — ABNORMAL LOW (ref 4.22–5.81)
RDW: 15.2 % (ref 11.5–15.5)
WBC Count: 4.3 10*3/uL (ref 4.0–10.5)
nRBC: 0 % (ref 0.0–0.2)

## 2022-09-30 LAB — CMP (CANCER CENTER ONLY)
ALT: 63 U/L — ABNORMAL HIGH (ref 0–44)
AST: 53 U/L — ABNORMAL HIGH (ref 15–41)
Albumin: 3.7 g/dL (ref 3.5–5.0)
Alkaline Phosphatase: 151 U/L — ABNORMAL HIGH (ref 38–126)
Anion gap: 6 (ref 5–15)
BUN: 11 mg/dL (ref 6–20)
CO2: 25 mmol/L (ref 22–32)
Calcium: 8.4 mg/dL — ABNORMAL LOW (ref 8.9–10.3)
Chloride: 108 mmol/L (ref 98–111)
Creatinine: 0.64 mg/dL (ref 0.61–1.24)
GFR, Estimated: >60 mL/min (ref >60)
Glucose, Bld: 98 mg/dL (ref 70–99)
Potassium: 3.5 mmol/L (ref 3.5–5.1)
Sodium: 139 mmol/L (ref 135–145)
Total Bilirubin: 0.5 mg/dL (ref 0.3–1.2)
Total Protein: 6.1 g/dL — ABNORMAL LOW (ref 6.5–8.1)

## 2022-09-30 MED ORDER — MAGNESIUM OXIDE -MG SUPPLEMENT 400 (240 MG) MG PO TABS: 400.0000 mg | ORAL_TABLET | Freq: Every day | ORAL | 1 refills | Status: DC

## 2022-09-30 MED ORDER — SODIUM CHLORIDE 0.9 % IV SOLN
2400.0000 mg/m2 | INTRAVENOUS | Status: DC
  Administered 2022-09-30: 4350 mg via INTRAVENOUS
  Filled 2022-09-30: qty 87

## 2022-09-30 MED ORDER — SODIUM CHLORIDE 0.9 % IV SOLN: Freq: Once | INTRAVENOUS | Status: AC

## 2022-09-30 MED ORDER — SODIUM CHLORIDE 0.9 % IV SOLN
10.0000 mg | Freq: Once | INTRAVENOUS | Status: AC
  Administered 2022-09-30: 10 mg via INTRAVENOUS
  Filled 2022-09-30: qty 10

## 2022-09-30 MED ORDER — SODIUM CHLORIDE 0.9% FLUSH: 10.0000 mL | INTRAVENOUS | Status: DC | PRN

## 2022-09-30 MED ORDER — SODIUM CHLORIDE 0.9 % IV SOLN
180.0000 mg/m2 | Freq: Once | INTRAVENOUS | Status: AC
  Administered 2022-09-30: 320 mg via INTRAVENOUS
  Filled 2022-09-30: qty 16

## 2022-09-30 MED ORDER — SODIUM CHLORIDE 0.9 % IV SOLN
400.0000 mg/m2 | Freq: Once | INTRAVENOUS | Status: AC
  Administered 2022-09-30: 728 mg via INTRAVENOUS
  Filled 2022-09-30: qty 36.4

## 2022-09-30 MED ORDER — SODIUM CHLORIDE 0.9 % IV SOLN
6.0000 mg/kg | Freq: Once | INTRAVENOUS | Status: AC
  Administered 2022-09-30: 400 mg via INTRAVENOUS
  Filled 2022-09-30: qty 20

## 2022-09-30 MED ORDER — PALONOSETRON HCL INJECTION 0.25 MG/5ML
0.2500 mg | Freq: Once | INTRAVENOUS | Status: AC
  Administered 2022-09-30: 0.25 mg via INTRAVENOUS
  Filled 2022-09-30: qty 5

## 2022-09-30 MED ORDER — SODIUM CHLORIDE 0.9% FLUSH
10.0000 mL | Freq: Once | INTRAVENOUS | Status: AC
  Administered 2022-09-30: 10 mL

## 2022-09-30 NOTE — Progress Notes (Signed)
Magnesium 1.5. MD starting Magnesium PO today.   Raul Del Flaxton, [place], BCPS, BCOP 09/30/2022 10:16 AM

## 2022-09-30 NOTE — Patient Instructions (Signed)
[place]  Discharge Instructions: Thank you for choosing [facility] to provide your oncology and hematology care.   If you have a lab appointment with the [facility], please go directly to the [facility] and check in at the registration area.   Wear comfortable clothing and clothing appropriate for easy access to any Portacath or PICC line.   We strive to give you quality time with your provider. You may need to reschedule your appointment if you arrive late (15 or more minutes).  Arriving late affects you and other patients whose appointments are after yours.  Also, if you miss three or more appointments without notifying the office, you may be dismissed from the clinic at the provider's discretion.      For prescription refill requests, have your pharmacy contact our office and allow 72 hours for refills to be completed.    Today you received the following chemotherapy and/or immunotherapy agents :  Panatumumab, Irinotecan, Leucovorein, & Fluorouracil with [facility] Plus Pump.       To help prevent nausea and vomiting after your treatment, we encourage you to take your nausea medication as directed.  BELOW ARE SYMPTOMS THAT SHOULD BE REPORTED IMMEDIATELY: *FEVER GREATER THAN 100.4 F (38 C) OR HIGHER *CHILLS OR SWEATING *NAUSEA AND VOMITING THAT IS NOT CONTROLLED WITH YOUR NAUSEA MEDICATION *UNUSUAL SHORTNESS OF BREATH *UNUSUAL BRUISING OR BLEEDING *URINARY PROBLEMS (pain or burning when urinating, or frequent urination) *BOWEL PROBLEMS (unusual diarrhea, constipation, pain near the anus) TENDERNESS IN MOUTH AND THROAT WITH OR WITHOUT PRESENCE OF ULCERS (sore throat, sores in mouth, or a toothache) UNUSUAL RASH, SWELLING OR PAIN  UNUSUAL VAGINAL DISCHARGE OR ITCHING   Items with * indicate a potential emergency and should be followed up as soon as possible or go to the Emergency Department if any problems should  occur.  Please show the CHEMOTHERAPY ALERT CARD or IMMUNOTHERAPY ALERT CARD at check-in to the Emergency Department and triage nurse.  Should you have questions after your visit or need to cancel or reschedule your appointment, please contact [facility]  Dept: [phone]  and follow the prompts.  Office hours are 8:00 a.m. to 4:30 p.m. Monday - Friday. Please note that voicemails left after 4:00 p.m. may not be returned until the following business day.  We are closed weekends and major holidays. You have access to a nurse at all times for urgent questions. Please call the main number to the clinic Dept: [phone] and follow the prompts.   For any non-urgent questions, you may also contact your provider using MyChart. We now offer e-Visits for anyone 9 and older to request care online for non-urgent symptoms. For details visit [email or web address].   Also download the MyChart app! Go to the app store, search "MyChart", open the app, select [facility], and log in with your MyChart username and password.

## 2022-09-30 NOTE — Progress Notes (Signed)
Pt's mother notified to p/u script for oral magnesium.  She expressed understanding.

## 2022-09-30 NOTE — Progress Notes (Signed)
Matthew [name]   [phone] [phone]   Clinic Follow up Note   Patient Care Team: Medicine, [facility] as PCP - General Clovis Riley, MD as Consulting Physician (General Surgery) Truitt Merle, MD as Consulting Physician (Oncology)  Date of Service:  09/30/2022  CHIEF COMPLAINT: f/u of colon cancer   CURRENT THERAPY:  FOLFIRI and Vectibix every 2 weeks   ASSESSMENT:  Matthew [name] is a 57 y.o. 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  -he started FOLFIRI on 05/08/2022, Vectibix added from cycle 2, tolerating well overall -restaging CT scan image from August 02, 2022 showed good partial response in pulmonary metastasis, no other new lesions. -Will continue current treatment, will consider switching to maintenance therapy with 5-fu and vectibix after next restaging scan, or sooner if he develops significant neuropathy. -He is tolerating treatment very well, will continue.  Schizophrenia [facility]) he was diagnosed with schizophrenia in his 20's, managed with medication, he is on disability. -he lives with his mother; he is able to do ADLs for himself, but not iADLS. They are both on disability. His mother also seems to have some kind of mental impairment    PLAN: -Lab reviewed, adequate for treatment, will proceed chemotherapy today -Follow-up in 2 weeks before next cycle chemo -Plan to repeat restaging CT scan in 3 to 4 weeks   SUMMARY OF ONCOLOGIC HISTORY: Oncology History Overview Note   Cancer Staging  Cancer of left colon [facility]) Staging form: Colon and Rectum, AJCC 8th Edition - Pathologic stage from 08/08/2021: Stage IIIB (pT3, pN1a, cM0) - Signed by Truitt Merle, MD on  08/26/2021    Cancer of left colon ([place])  04/01/2020 Imaging   IMPRESSION: 1. Gallbladder decompressed bowel also partially calcified gallstones. No pericholecystic inflammation though if there is persisting clinical concern for cholecystitis right upper quadrant ultrasound could be obtained. 2. Circumferential thickening of the distal thoracic esophagus. Could reflect features of esophagitis. Correlate with clinical symptoms and consider endoscopy as clinically warranted. 3. Additional segmental thickening of the mid to distal sigmoid with focal narrowing. No acute surrounding inflammation or resulting obstruction. Findings are nonspecific, and could reflect sequela of prior inflammation/infection. However, recommend correlation with colonoscopy if not recently performed. 4. Mild circumferential bladder wall thickening and indentation of the bladder base by an enlarged prostate. Possibly sequela of chronic outlet obstruction though could correlate with urinalysis to exclude cystitis. 5. Aortic Atherosclerosis (ICD10-I70.0).   08/06/2021 Imaging   IMPRESSION: Sigmoid colonic perforation with small free intraperitoneal gas and infiltration of the mesenteric and omental fat in keeping with changes of peritonitis.   Long segment inflammatory stranding of the sigmoid colon in keeping with a severe infectious or inflammatory colitis. This terminates an area of irregular mural thickening, infiltrative soft tissue within the colonic mesentery, and focal dystrophic calcification. This may represent a chronic inflammatory process, however, a perforated malignancy could appear similarly. There are 2 separate points of perforation which again raise the question of an underlying malignancy.   7.1 cm gas and fluid containing pericolonic abscess within the sigmoid mesentery.   Marked inflammatory change of the terminal ileum adjacent to the pericolonic abscess with resultant small bowel  obstruction. Fluid within the distal esophagus likely relates to gastroesophageal  reflux the setting of vomiting.   Aortic Atherosclerosis (ICD10-I70.0).   08/08/2021 Cancer Staging   Staging form: Colon and Rectum, AJCC 8th Edition - Pathologic stage from 08/08/2021: Stage IIIB (pT3, pN1a, cM0) - Signed by Truitt Merle, MD on 08/26/2021 Stage prefix: Initial diagnosis Total positive nodes: 1 Histologic grading system: 4 grade system Histologic grade (G): G2 Residual tumor (R): R0 - None   08/08/2021 Definitive Surgery   FINAL MICROSCOPIC DIAGNOSIS:   A. COLON, SIGMOID, PARTIAL COLECTOMY:  - Invasive moderately differentiated adenocarcinoma.  - Metastatic carcinoma involving one of twelve lymph nodes (1/12).  - See oncology table below.   ADDENDUM:  Mismatch Repair Protein ([facility])  SUMMARY INTERPRETATION: NORMAL    08/14/2021 Imaging   EXAM: CT ABDOMEN AND PELVIS WITH CONTRAST  IMPRESSION: 1. Post recent sigmoid colectomy with left lower quadrant colostomy. Two small residual foci of air within the pelvic mesentery with mild adjacent thickening, but no abscess or drainable collection. Trace non organized free fluid and stranding in the pelvis. 2. Short segment of small bowel wall thickening and inflammation in the pelvis involving the distal ileum, likely reactive. 3. Dilated distal esophagus, stomach, and small bowel, without discrete transition point, favoring postoperative ileus. 4. Small bilateral pleural effusions and compressive atelectasis. 5. Heterogeneous partially enhancing 14 mm lymph node in the retroperitoneum at the aortoiliac bifurcation, not significantly changed from prior exam. Suspected additional lymph nodes in the anterior common iliac space, not significantly changed from prior exam. Recommend attention at follow-up. 6. Additional chronic findings as described.     08/15/2021 Imaging   EXAM: CT CHEST WITH CONTRAST  IMPRESSION: 1. No evidence of thoracic  metastasis. 2. Bilateral small layering pleural effusions with passive atelectasis   08/26/2021 Initial Diagnosis   Cancer of left colon ([place])   10/10/2021 - 12/12/2021 Chemotherapy   Patient is on Treatment Plan : COLORECTAL Xelox ([place]ox) q21d     05/28/2022 - 05/28/2022 Chemotherapy   Patient is on Treatment Plan : COLORECTAL FOLFIRI + Bevacizumab q14d     05/28/2022 -  Chemotherapy   Patient is on Treatment Plan : COLORECTAL FOLFIRI + Panitumumab q14d     07/31/2022 Imaging    IMPRESSION: Decreased bilateral pulmonary metastases.   Stable mild abdominal lymphadenopathy.   No new or progressive metastatic disease within the chest, abdomen, or pelvis.      INTERVAL HISTORY:  Matthew [name] is here for a follow up of colon cancer. He was last seen by me on 09/17/2022. He presents to the clinic accompanied by his mother. He is tolerating chemo well, had one episode of mild hemoptysis and bleeding at colostomy site. No other bleeding. He is eating well, weight is stable.    All other systems were reviewed with the patient and are negative.  MEDICAL HISTORY:  Past Medical History:  Diagnosis Date   Cancer ([place])    Schizophrenia [facility])     SURGICAL HISTORY: Past Surgical History:  Procedure Laterality Date   BRONCHIAL BIOPSY  05/11/2022   Procedure: BRONCHIAL BIOPSIES;  Surgeon: Collene Gobble, MD;  Location[facility] ENDOSCOPY;  Service: Pulmonary;;   BRONCHIAL BRUSHINGS  05/11/2022   Procedure: BRONCHIAL BRUSHINGS;  Surgeon: Collene Gobble, MD;  Location: [facility] ENDOSCOPY;  Service: Pulmonary;;   BRONCHIAL NEEDLE ASPIRATION BIOPSY  05/11/2022   Procedure: BRONCHIAL NEEDLE ASPIRATION BIOPSIES;  Surgeon: Collene Gobble, MD;  Location: [facility] ENDOSCOPY;  Service: Pulmonary;;   BRONCHIAL WASHINGS  05/11/2022   Procedure: BRONCHIAL WASHINGS;  Surgeon: Baltazar Apo  S, MD;  Location: [place];  Service: Pulmonary;;   IR IMAGING GUIDED PORT INSERTION  05/25/2022   LAPAROTOMY N/A 08/08/2021    Procedure: EXPLORATORY LAPAROTOMY;  Surgeon: Clovis Riley, MD;  Location: [place];  Service: General;  Laterality: N/A;   PARTIAL COLECTOMY N/A 08/08/2021   Procedure: PARTIAL COLECTOMY WITH COLOSTOMY;  Surgeon: Clovis Riley, MD;  Location: [place];  Service: General;  Laterality: N/A;   VIDEO BRONCHOSCOPY WITH RADIAL ENDOBRONCHIAL ULTRASOUND  05/11/2022   Procedure: VIDEO BRONCHOSCOPY WITH RADIAL ENDOBRONCHIAL ULTRASOUND;  Surgeon: Collene Gobble, MD;  Location: [place] ENDOSCOPY;  Service: Pulmonary;;    I have reviewed the social history and family history with the patient and they are unchanged from previous note.  ALLERGIES:  has No Known Allergies.  MEDICATIONS:  Current Outpatient Medications  Medication Sig Dispense Refill   acetaminophen (TYLENOL) 500 MG tablet Take 2 tablets (1,000 mg total) by mouth every 6 (six) hours as needed for mild pain or moderate pain.  0   aspirin EC 81 MG tablet Take 81 mg by mouth daily as needed (for pain or headaches).      benztropine (COGENTIN) 1 MG tablet Take 1 mg at bedtime by mouth.     clindamycin (CLINDAGEL) 1 % gel Apply topically 2 (two) times daily. To skin rash on face and upper body 30 g 0   ferrous sulfate 325 (65 FE) MG EC tablet Take 1 tablet (325 mg total) by mouth daily. (Patient not taking: Reported on 05/07/2022) 30 tablet 3   hydrocortisone cream 1 % Apply 1 Application topically 2 (two) times daily as needed for itching. For rash 30 g 2   Multiple Vitamin (MULTIVITAMIN WITH MINERALS) TABS tablet Take 1 tablet by mouth daily.     ondansetron (ZOFRAN) 8 MG tablet Take 1 tablet (8 mg total) by mouth every 8 (eight) hours as needed for nausea or vomiting. 30 tablet 0   paliperidone (INVEGA SUSTENNA) 156 MG/ML SUSP injection Inject 156 mg every 30 (thirty) days into the muscle.     polyethylene glycol (MIRALAX / GLYCOLAX) 17 g packet Take 17 g by mouth daily as needed for mild constipation or moderate constipation. (Patient not taking:  Reported on 05/07/2022)  0   prochlorperazine (COMPAZINE) 10 MG tablet Take 1 tablet (10 mg total) by mouth every 6 (six) hours as needed for nausea or vomiting. 30 tablet 0   No current facility-administered medications for this visit.    PHYSICAL EXAMINATION: ECOG PERFORMANCE STATUS: 1 - Symptomatic but completely ambulatory  Vitals:   09/30/22 0928  BP: 108/75  Pulse: 97  Resp: 18  Temp: 98.3 F (36.8 C)  SpO2: 100%   Wt Readings from Last 3 Encounters:  09/30/22 148 lb 12.8 oz (67.5 kg)  09/17/22 147 lb 14.4 oz (67.1 kg)  09/02/22 147 lb 11.3 oz (67 kg)     GENERAL:alert, no distress and comfortable SKIN: skin color, texture, turgor are normal, no rashes or significant lesions EYES: normal, Conjunctiva are pink and non-injected, sclera clear NECK: supple, thyroid normal size, non-tender, without nodularity LYMPH:  no palpable lymphadenopathy in the cervical, axillary  LUNGS: clear to auscultation and percussion with normal breathing effort HEART: regular rate & rhythm and no murmurs and no lower extremity edema ABDOMEN:abdomen soft, non-tender and normal bowel sounds, (+) colostomy bag Musculoskeletal:no cyanosis of digits and no clubbing  NEURO: alert & oriented x 3 with fluent speech, no focal motor/sensory deficits  LABORATORY DATA:  I have reviewed the data as listed    Latest Ref Rng & Units 09/30/2022    9:01 AM 09/17/2022    9:26 AM 09/02/2022   10:05 AM  CBC  WBC 4.0 - 10.5 K/uL 4.3  4.2  3.6   Hemoglobin 13.0 - 17.0 g/dL 11.9  12.2  12.7   Hematocrit 39.0 - 52.0 % 35.4  35.4  36.5   Platelets 150 - 400 K/uL 333  336  336         Latest Ref Rng & Units 09/30/2022    9:01 AM 09/17/2022    9:26 AM 09/02/2022   10:05 AM  CMP  Glucose 70 - 99 mg/dL 98  102  96   BUN 6 - 20 mg/dL '11  10  10   '$ Creatinine 0.61 - 1.24 mg/dL 0.64  0.75  0.72   Sodium 135 - 145 mmol/L 139  141  138   Potassium 3.5 - 5.1 mmol/L 3.5  3.5  3.7   Chloride 98 - 111 mmol/L 108  106   107   CO2 22 - 32 mmol/L '25  23  24   '$ Calcium 8.9 - 10.3 mg/dL 8.4  9.0  9.4   Total Protein 6.5 - 8.1 g/dL 6.1  7.0  6.8   Total Bilirubin 0.3 - 1.2 mg/dL 0.5  0.7  0.7   Alkaline Phos 38 - 126 U/L 151  139  141   AST 15 - 41 U/L 53  50  23   ALT 0 - 44 U/L 63  56  36       RADIOGRAPHIC STUDIES: I have personally reviewed the radiological images as listed and agreed with the findings in the report. No results found.    Orders Placed This Encounter  Procedures   CT CHEST ABDOMEN PELVIS W CONTRAST    Standing Status:   Future    Standing Expiration Date:   10/01/2023    Order Specific Question:   Preferred imaging location?    Answer:   [facility]    Order Specific Question:   Release to patient    Answer:   Immediate    Order Specific Question:   Is Oral Contrast requested for this exam?    Answer:   Yes, Per Radiology protocol   CBC with Differential ([place] Only)    Standing Status:   Future    Standing Expiration Date:   11/12/2023   CMP ([place] only)    Standing Status:   Future    Standing Expiration Date:   11/12/2023   Magnesium    Standing Status:   Future    Standing Expiration Date:   11/12/2023   All questions were answered. The patient knows to call the clinic with any problems, questions or concerns. No barriers to learning was detected. The total time spent in the appointment was 30 minutes.     Truitt Merle, MD 09/30/2022

## 2022-10-01 ENCOUNTER — Other Ambulatory Visit: Payer: Self-pay

## 2022-10-02 ENCOUNTER — Other Ambulatory Visit: Payer: Self-pay

## 2022-10-02 ENCOUNTER — Inpatient Hospital Stay: Payer: Medicaid Other | Attending: Hematology

## 2022-10-02 VITALS — BP 100/76 | HR 104 | Temp 99.9°F | Resp 18

## 2022-10-02 DIAGNOSIS — Z79899 Other long term (current) drug therapy: Secondary | ICD-10-CM | POA: Diagnosis not present

## 2022-10-02 DIAGNOSIS — C186 Malignant neoplasm of descending colon: Secondary | ICD-10-CM

## 2022-10-02 DIAGNOSIS — Z5112 Encounter for antineoplastic immunotherapy: Secondary | ICD-10-CM | POA: Insufficient documentation

## 2022-10-02 DIAGNOSIS — Z5111 Encounter for antineoplastic chemotherapy: Secondary | ICD-10-CM | POA: Diagnosis present

## 2022-10-02 MED ORDER — HEPARIN SOD (PORK) LOCK FLUSH 100 UNIT/ML IV SOLN
500.0000 [IU] | Freq: Once | INTRAVENOUS | Status: AC | PRN
  Administered 2022-10-02: 500 [IU]

## 2022-10-02 MED ORDER — SODIUM CHLORIDE 0.9% FLUSH
10.0000 mL | INTRAVENOUS | Status: DC | PRN
  Administered 2022-10-02: 10 mL

## 2022-10-13 MED FILL — Dexamethasone Sodium Phosphate Inj 100 MG/10ML: INTRAMUSCULAR | Qty: 1 | Status: AC

## 2022-10-13 NOTE — Assessment & Plan Note (Signed)
stage IIIB p(T3, N1aM0), MSS, KRAS/NRAS/BRAF wild type, lung and node metastasis in 04/2022  -diagnosed in 08/2021 -he completed 4 cycles adjuvant CAPOX 10/10/21 - 12/25/21 (though he somehow was still taking Xeloda through ~02/09/22 due to misunderstandings).  -he started FOLFIRI on 05/08/2022, Vectibix added from cycle 2, tolerating well overall -restaging CT scan image from August 02, 2022 showed good partial response in pulmonary metastasis, no other new lesions. -Will continue current treatment, will consider switching to maintenance therapy with 5-fu and vectibix after next restaging scan, or sooner if he develops significant neuropathy. -He is tolerating treatment very well, will continue. 

## 2022-10-13 NOTE — Assessment & Plan Note (Signed)
he was diagnosed with schizophrenia in his 20's, managed with medication, he is on disability. -he lives with his mother; he is able to do ADLs for himself, but not iADLS. They are both on disability. His mother also seems to have some kind of mental impairment 

## 2022-10-14 ENCOUNTER — Encounter: Payer: Self-pay | Admitting: Hematology

## 2022-10-14 ENCOUNTER — Inpatient Hospital Stay: Payer: Medicaid Other

## 2022-10-14 ENCOUNTER — Inpatient Hospital Stay (HOSPITAL_BASED_OUTPATIENT_CLINIC_OR_DEPARTMENT_OTHER): Payer: Medicaid Other | Admitting: Hematology

## 2022-10-14 ENCOUNTER — Other Ambulatory Visit: Payer: Self-pay

## 2022-10-14 VITALS — BP 130/85 | HR 98 | Temp 98.7°F | Resp 18 | Ht 69.0 in | Wt 151.1 lb

## 2022-10-14 DIAGNOSIS — F209 Schizophrenia, unspecified: Secondary | ICD-10-CM

## 2022-10-14 DIAGNOSIS — C186 Malignant neoplasm of descending colon: Secondary | ICD-10-CM | POA: Diagnosis not present

## 2022-10-14 DIAGNOSIS — Z95828 Presence of other vascular implants and grafts: Secondary | ICD-10-CM

## 2022-10-14 DIAGNOSIS — Z5111 Encounter for antineoplastic chemotherapy: Secondary | ICD-10-CM | POA: Diagnosis not present

## 2022-10-14 DIAGNOSIS — C189 Malignant neoplasm of colon, unspecified: Secondary | ICD-10-CM

## 2022-10-14 LAB — CBC WITH DIFFERENTIAL (CANCER CENTER ONLY)
Abs Immature Granulocytes: 0.01 10*3/uL (ref 0.00–0.07)
Basophils Absolute: 0 10*3/uL (ref 0.0–0.1)
Basophils Relative: 1 %
Eosinophils Absolute: 0.2 10*3/uL (ref 0.0–0.5)
Eosinophils Relative: 5 %
HCT: 35.1 % — ABNORMAL LOW (ref 39.0–52.0)
Hemoglobin: 11.9 g/dL — ABNORMAL LOW (ref 13.0–17.0)
Immature Granulocytes: 0 %
Lymphocytes Relative: 19 %
Lymphs Abs: 0.8 10*3/uL (ref 0.7–4.0)
MCH: 29.8 pg (ref 26.0–34.0)
MCHC: 33.9 g/dL (ref 30.0–36.0)
MCV: 87.8 fL (ref 80.0–100.0)
Monocytes Absolute: 0.5 10*3/uL (ref 0.1–1.0)
Monocytes Relative: 11 %
Neutro Abs: 2.8 10*3/uL (ref 1.7–7.7)
Neutrophils Relative %: 64 %
Platelet Count: 307 10*3/uL (ref 150–400)
RBC: 4 MIL/uL — ABNORMAL LOW (ref 4.22–5.81)
RDW: 15.6 % — ABNORMAL HIGH (ref 11.5–15.5)
WBC Count: 4.4 10*3/uL (ref 4.0–10.5)
nRBC: 0 % (ref 0.0–0.2)

## 2022-10-14 LAB — CMP (CANCER CENTER ONLY)
ALT: 55 U/L — ABNORMAL HIGH (ref 0–44)
AST: 32 U/L (ref 15–41)
Albumin: 3.8 g/dL (ref 3.5–5.0)
Alkaline Phosphatase: 152 U/L — ABNORMAL HIGH (ref 38–126)
Anion gap: 7 (ref 5–15)
BUN: 10 mg/dL (ref 6–20)
CO2: 26 mmol/L (ref 22–32)
Calcium: 9 mg/dL (ref 8.9–10.3)
Chloride: 106 mmol/L (ref 98–111)
Creatinine: 0.72 mg/dL (ref 0.61–1.24)
GFR, Estimated: >60 mL/min (ref >60)
Glucose, Bld: 101 mg/dL — ABNORMAL HIGH (ref 70–99)
Potassium: 3.5 mmol/L (ref 3.5–5.1)
Sodium: 139 mmol/L (ref 135–145)
Total Bilirubin: 0.6 mg/dL (ref 0.3–1.2)
Total Protein: 6.6 g/dL (ref 6.5–8.1)

## 2022-10-14 LAB — MAGNESIUM: Magnesium: 1.7 mg/dL (ref 1.7–2.4)

## 2022-10-14 MED ORDER — SODIUM CHLORIDE 0.9 % IV SOLN
10.0000 mg | Freq: Once | INTRAVENOUS | Status: AC
  Administered 2022-10-14: 10 mg via INTRAVENOUS
  Filled 2022-10-14: qty 10

## 2022-10-14 MED ORDER — ATROPINE SULFATE 1 MG/ML IV SOLN
0.5000 mg | Freq: Once | INTRAVENOUS | Status: AC | PRN
  Administered 2022-10-14: 0.5 mg via INTRAVENOUS
  Filled 2022-10-14: qty 1

## 2022-10-14 MED ORDER — SODIUM CHLORIDE 0.9 % IV SOLN
400.0000 mg/m2 | Freq: Once | INTRAVENOUS | Status: AC
  Administered 2022-10-14: 728 mg via INTRAVENOUS
  Filled 2022-10-14: qty 36.4

## 2022-10-14 MED ORDER — SODIUM CHLORIDE 0.9% FLUSH: 10.0000 mL | INTRAVENOUS | Status: DC | PRN

## 2022-10-14 MED ORDER — SODIUM CHLORIDE 0.9 % IV SOLN
6.0000 mg/kg | Freq: Once | INTRAVENOUS | Status: AC
  Administered 2022-10-14: 400 mg via INTRAVENOUS
  Filled 2022-10-14: qty 20

## 2022-10-14 MED ORDER — SODIUM CHLORIDE 0.9 % IV SOLN
180.0000 mg/m2 | Freq: Once | INTRAVENOUS | Status: AC
  Administered 2022-10-14: 320 mg via INTRAVENOUS
  Filled 2022-10-14: qty 16

## 2022-10-14 MED ORDER — HEPARIN SOD (PORK) LOCK FLUSH 100 UNIT/ML IV SOLN: 500.0000 [IU] | Freq: Once | INTRAVENOUS | Status: DC | PRN

## 2022-10-14 MED ORDER — SODIUM CHLORIDE 0.9% FLUSH
10.0000 mL | Freq: Once | INTRAVENOUS | Status: AC
  Administered 2022-10-14: 10 mL

## 2022-10-14 MED ORDER — SODIUM CHLORIDE 0.9 % IV SOLN
2400.0000 mg/m2 | INTRAVENOUS | Status: DC
  Administered 2022-10-14: 4350 mg via INTRAVENOUS
  Filled 2022-10-14: qty 87

## 2022-10-14 MED ORDER — PALONOSETRON HCL INJECTION 0.25 MG/5ML
0.2500 mg | Freq: Once | INTRAVENOUS | Status: AC
  Administered 2022-10-14: 0.25 mg via INTRAVENOUS
  Filled 2022-10-14: qty 5

## 2022-10-14 MED ORDER — SODIUM CHLORIDE 0.9 % IV SOLN: Freq: Once | INTRAVENOUS | Status: AC

## 2022-10-14 NOTE — Patient Instructions (Signed)
Matthew [name]  Discharge Instructions: Thank you for choosing [facility] to provide your oncology and hematology care.   If you have a lab appointment with the [facility], please go directly to the [facility] and check in at the registration area.   Wear comfortable clothing and clothing appropriate for easy access to any Portacath or PICC line.   We strive to give you quality time with your provider. You may need to reschedule your appointment if you arrive late (15 or more minutes).  Arriving late affects you and other patients whose appointments are after yours.  Also, if you miss three or more appointments without notifying the office, you may be dismissed from the clinic at the provider's discretion.      For prescription refill requests, have your pharmacy contact our office and allow 72 hours for refills to be completed.    Today you received the following chemotherapy and/or immunotherapy agents: Vectibix, Irinotecan, Leucovorin, Fluorouracil.       To help prevent nausea and vomiting after your treatment, we encourage you to take your nausea medication as directed.  BELOW ARE SYMPTOMS THAT SHOULD BE REPORTED IMMEDIATELY: *FEVER GREATER THAN 100.4 F (38 C) OR HIGHER *CHILLS OR SWEATING *NAUSEA AND VOMITING THAT IS NOT CONTROLLED WITH YOUR NAUSEA MEDICATION *UNUSUAL SHORTNESS OF BREATH *UNUSUAL BRUISING OR BLEEDING *URINARY PROBLEMS (pain or burning when urinating, or frequent urination) *BOWEL PROBLEMS (unusual diarrhea, constipation, pain near the anus) TENDERNESS IN MOUTH AND THROAT WITH OR WITHOUT PRESENCE OF ULCERS (sore throat, sores in mouth, or a toothache) UNUSUAL RASH, SWELLING OR PAIN  UNUSUAL VAGINAL DISCHARGE OR ITCHING   Items with * indicate a potential emergency and should be followed up as soon as possible or go to the Emergency Department if any problems should occur.  Please show the CHEMOTHERAPY ALERT  CARD or IMMUNOTHERAPY ALERT CARD at check-in to the Emergency Department and triage nurse.  Should you have questions after your visit or need to cancel or reschedule your appointment, please contact [facility]  Dept: [phone]  and follow the prompts.  Office hours are 8:00 a.m. to 4:30 p.m. Monday - Friday. Please note that voicemails left after 4:00 p.m. may not be returned until the following business day.  We are closed weekends and major holidays. You have access to a nurse at all times for urgent questions. Please call the main number to the clinic Dept: [phone] and follow the prompts.   For any non-urgent questions, you may also contact your provider using MyChart. We now offer e-Visits for anyone 51 and older to request care online for non-urgent symptoms. For details visit [email or web address].   Also download the MyChart app! Go to the app store, search "MyChart", open the app, select [facility], and log in with your MyChart username and password.

## 2022-10-14 NOTE — Progress Notes (Signed)
[place]   [phone] [phone]) [phone]   Clinic Follow up Note   Patient Care Team: Medicine, [facility] as PCP - General Clovis Riley, MD as Consulting Physician (General Surgery) Truitt Merle, MD as Consulting Physician (Oncology)  Date of Service:  10/14/2022  CHIEF COMPLAINT: f/u of  colon cancer    CURRENT THERAPY:  FOLFIRI and Vectibix every 2 weeks    ASSESSMENT:  Matthew [name] is a 57 y.o. 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  -he started FOLFIRI on 05/08/2022, Vectibix added from cycle 2, tolerating well overall -restaging CT scan image from August 02, 2022 showed good partial response in pulmonary metastasis, no other new lesions. -Will continue current treatment, will consider switching to maintenance therapy with 5-fu and vectibix after next restaging scan, or sooner if he develops significant neuropathy. -He is tolerating treatment very well, will continue.  Schizophrenia ([place]) -he was diagnosed with schizophrenia in his 20's, managed with medication, he is on disability. -he lives with his mother; he is able to do ADLs for himself, but not iADLS. They are both on disability. His mother also seems to have some kind of mental impairment       PLAN: -lab reviewed  -proceed with C11 FOLFIRI+VECTIBIX - CT scan scheduled 3/25 -f/u with NP Regan Rakers 3/27 before next cycle chemo   SUMMARY OF ONCOLOGIC HISTORY: Oncology History Overview Note   Cancer Staging  Cancer of left colon [facility]) Staging form: Colon and Rectum, AJCC 8th Edition - Pathologic stage from 08/08/2021: Stage IIIB (pT3, pN1a, cM0) - Signed by Truitt Merle, MD on 08/26/2021    Cancer of left colon  ([place])  04/01/2020 Imaging   IMPRESSION: 1. Gallbladder decompressed bowel also partially calcified gallstones. No pericholecystic inflammation though if there is persisting clinical concern for cholecystitis right upper quadrant ultrasound could be obtained. 2. Circumferential thickening of the distal thoracic esophagus. Could reflect features of esophagitis. Correlate with clinical symptoms and consider endoscopy as clinically warranted. 3. Additional segmental thickening of the mid to distal sigmoid with focal narrowing. No acute surrounding inflammation or resulting obstruction. Findings are nonspecific, and could reflect sequela of prior inflammation/infection. However, recommend correlation with colonoscopy if not recently performed. 4. Mild circumferential bladder wall thickening and indentation of the bladder base by an enlarged prostate. Possibly sequela of chronic outlet obstruction though could correlate with urinalysis to exclude cystitis. 5. Aortic Atherosclerosis (ICD10-I70.0).   08/06/2021 Imaging   IMPRESSION: Sigmoid colonic perforation with small free intraperitoneal gas and infiltration of the mesenteric and omental fat in keeping with changes of peritonitis.   Long segment inflammatory stranding of the sigmoid colon in keeping with a severe infectious or inflammatory colitis. This terminates an area of irregular mural thickening, infiltrative soft tissue within the colonic mesentery, and focal dystrophic calcification. This may represent a chronic inflammatory process, however, a perforated malignancy could appear similarly. There are 2 separate points of perforation which again raise the question of an underlying malignancy.   7.1 cm gas and fluid containing pericolonic abscess within the sigmoid mesentery.   Marked inflammatory change of the terminal ileum adjacent to the pericolonic abscess with resultant small bowel obstruction. Fluid within the distal esophagus  likely relates to gastroesophageal reflux  the setting of vomiting.   Aortic Atherosclerosis (ICD10-I70.0).   08/08/2021 Cancer Staging   Staging form: Colon and Rectum, AJCC 8th Edition - Pathologic stage from 08/08/2021: Stage IIIB (pT3, pN1a, cM0) - Signed by Truitt Merle, MD on 08/26/2021 Stage prefix: Initial diagnosis Total positive nodes: 1 Histologic grading system: 4 grade system Histologic grade (G): G2 Residual tumor (R): R0 - None   08/08/2021 Definitive Surgery   FINAL MICROSCOPIC DIAGNOSIS:   A. COLON, SIGMOID, PARTIAL COLECTOMY:  - Invasive moderately differentiated adenocarcinoma.  - Metastatic carcinoma involving one of twelve lymph nodes (1/12).  - See oncology table below.   ADDENDUM:  Mismatch Repair Protein ([facility])  SUMMARY INTERPRETATION: NORMAL    08/14/2021 Imaging   EXAM: CT ABDOMEN AND PELVIS WITH CONTRAST  IMPRESSION: 1. Post recent sigmoid colectomy with left lower quadrant colostomy. Two small residual foci of air within the pelvic mesentery with mild adjacent thickening, but no abscess or drainable collection. Trace non organized free fluid and stranding in the pelvis. 2. Short segment of small bowel wall thickening and inflammation in the pelvis involving the distal ileum, likely reactive. 3. Dilated distal esophagus, stomach, and small bowel, without discrete transition point, favoring postoperative ileus. 4. Small bilateral pleural effusions and compressive atelectasis. 5. Heterogeneous partially enhancing 14 mm lymph node in the retroperitoneum at the aortoiliac bifurcation, not significantly changed from prior exam. Suspected additional lymph nodes in the anterior common iliac space, not significantly changed from prior exam. Recommend attention at follow-up. 6. Additional chronic findings as described.     08/15/2021 Imaging   EXAM: CT CHEST WITH CONTRAST  IMPRESSION: 1. No evidence of thoracic metastasis. 2. Bilateral small layering pleural  effusions with passive atelectasis   08/26/2021 Initial Diagnosis   Cancer of left colon ([place])   10/10/2021 - 12/12/2021 Chemotherapy   Patient is on Treatment Plan : COLORECTAL Xelox ([place]ox) q21d     05/28/2022 - 05/28/2022 Chemotherapy   Patient is on Treatment Plan : COLORECTAL FOLFIRI + Bevacizumab q14d     05/28/2022 -  Chemotherapy   Patient is on Treatment Plan : COLORECTAL FOLFIRI + Panitumumab q14d     07/31/2022 Imaging    IMPRESSION: Decreased bilateral pulmonary metastases.   Stable mild abdominal lymphadenopathy.   No new or progressive metastatic disease within the chest, abdomen, or pelvis.      INTERVAL HISTORY:  Matthew [name] is here for a follow up of  colon cancer.   He was last seen by me on  09/30/2022.He presents to the clinic accompanied by mother. Pt states he had no issues from last treatment. Pt mother states he had some loose stool at least 2 times a day.Pt mother think its come from the different juices. Pt mother state he sleeps a lot.    All other systems were reviewed with the patient and are negative.  MEDICAL HISTORY:  Past Medical History:  Diagnosis Date   Cancer ([place])    Schizophrenia [facility])     SURGICAL HISTORY: Past Surgical History:  Procedure Laterality Date   BRONCHIAL BIOPSY  05/11/2022   Procedure: BRONCHIAL BIOPSIES;  Surgeon: Collene Gobble, MD;  Location[facility] ENDOSCOPY;  Service: Pulmonary;;   BRONCHIAL BRUSHINGS  05/11/2022   Procedure: BRONCHIAL BRUSHINGS;  Surgeon: Collene Gobble, MD;  Location: [facility] ENDOSCOPY;  Service: Pulmonary;;   BRONCHIAL NEEDLE ASPIRATION BIOPSY  05/11/2022   Procedure: BRONCHIAL NEEDLE ASPIRATION BIOPSIES;  Surgeon: Collene Gobble, MD;  Location: [facility] ENDOSCOPY;  Service: Pulmonary;;  BRONCHIAL WASHINGS  05/11/2022   Procedure: BRONCHIAL WASHINGS;  Surgeon: Collene Gobble, MD;  Location: [facility] ENDOSCOPY;  Service: Pulmonary;;   IR IMAGING GUIDED PORT INSERTION  05/25/2022   LAPAROTOMY N/A 08/08/2021    Procedure: EXPLORATORY LAPAROTOMY;  Surgeon: Clovis Riley, MD;  Location: [place];  Service: General;  Laterality: N/A;   PARTIAL COLECTOMY N/A 08/08/2021   Procedure: PARTIAL COLECTOMY WITH COLOSTOMY;  Surgeon: Clovis Riley, MD;  Location: [place];  Service: General;  Laterality: N/A;   VIDEO BRONCHOSCOPY WITH RADIAL ENDOBRONCHIAL ULTRASOUND  05/11/2022   Procedure: VIDEO BRONCHOSCOPY WITH RADIAL ENDOBRONCHIAL ULTRASOUND;  Surgeon: Collene Gobble, MD;  Location: [place] ENDOSCOPY;  Service: Pulmonary;;    I have reviewed the social history and family history with the patient and they are unchanged from previous note.  ALLERGIES:  has No Known Allergies.  MEDICATIONS:  Current Outpatient Medications  Medication Sig Dispense Refill   acetaminophen (TYLENOL) 500 MG tablet Take 2 tablets (1,000 mg total) by mouth every 6 (six) hours as needed for mild pain or moderate pain.  0   aspirin EC 81 MG tablet Take 81 mg by mouth daily as needed (for pain or headaches).      benztropine (COGENTIN) 1 MG tablet Take 1 mg at bedtime by mouth.     clindamycin (CLINDAGEL) 1 % gel Apply topically 2 (two) times daily. To skin rash on face and upper body 30 g 0   ferrous sulfate 325 (65 FE) MG EC tablet Take 1 tablet (325 mg total) by mouth daily. (Patient not taking: Reported on 05/07/2022) 30 tablet 3   hydrocortisone cream 1 % Apply 1 Application topically 2 (two) times daily as needed for itching. For rash 30 g 2   magnesium oxide (MAG-OX) 400 (240 Mg) MG tablet Take 1 tablet (400 mg total) by mouth daily. 30 tablet 1   Multiple Vitamin (MULTIVITAMIN WITH MINERALS) TABS tablet Take 1 tablet by mouth daily.     ondansetron (ZOFRAN) 8 MG tablet Take 1 tablet (8 mg total) by mouth every 8 (eight) hours as needed for nausea or vomiting. 30 tablet 0   paliperidone (INVEGA SUSTENNA) 156 MG/ML SUSP injection Inject 156 mg every 30 (thirty) days into the muscle.     polyethylene glycol (MIRALAX / GLYCOLAX) 17 g  packet Take 17 g by mouth daily as needed for mild constipation or moderate constipation. (Patient not taking: Reported on 05/07/2022)  0   prochlorperazine (COMPAZINE) 10 MG tablet Take 1 tablet (10 mg total) by mouth every 6 (six) hours as needed for nausea or vomiting. 30 tablet 0   No current facility-administered medications for this visit.    PHYSICAL EXAMINATION: ECOG PERFORMANCE STATUS: 1 - Symptomatic but completely ambulatory  Vitals:   10/14/22 0855  BP: 130/85  Pulse: 98  Resp: 18  Temp: 98.7 F (37.1 C)  SpO2: 100%   Wt Readings from Last 3 Encounters:  10/14/22 151 lb 1.6 oz (68.5 kg)  09/30/22 148 lb 12.8 oz (67.5 kg)  09/17/22 147 lb 14.4 oz (67.1 kg)     NECK: (-) supple, thyroid normal size, non-tender, without nodularity LYMPH:(-)  no palpable lymphadenopathy in the cervical, axillary  LUNGS: (-) clear to auscultation and percussion with normal breathing effort HEART:(-)  regular rate & rhythm and no murmurs and (-) no lower extremity edema ABDOMEN:(-)abdomen soft, (-) non-tender and normal bowel sounds    LABORATORY DATA:  I have reviewed the data as listed  Latest Ref Rng & Units 10/14/2022    8:37 AM 09/30/2022    9:01 AM 09/17/2022    9:26 AM  CBC  WBC 4.0 - 10.5 K/uL 4.4  4.3  4.2   Hemoglobin 13.0 - 17.0 g/dL 11.9  11.9  12.2   Hematocrit 39.0 - 52.0 % 35.1  35.4  35.4   Platelets 150 - 400 K/uL 307  333  336         Latest Ref Rng & Units 09/30/2022    9:01 AM 09/17/2022    9:26 AM 09/02/2022   10:05 AM  CMP  Glucose 70 - 99 mg/dL 98  102  96   BUN 6 - 20 mg/dL '11  10  10   '$ Creatinine 0.61 - 1.24 mg/dL 0.64  0.75  0.72   Sodium 135 - 145 mmol/L 139  141  138   Potassium 3.5 - 5.1 mmol/L 3.5  3.5  3.7   Chloride 98 - 111 mmol/L 108  106  107   CO2 22 - 32 mmol/L '25  23  24   '$ Calcium 8.9 - 10.3 mg/dL 8.4  9.0  9.4   Total Protein 6.5 - 8.1 g/dL 6.1  7.0  6.8   Total Bilirubin 0.3 - 1.2 mg/dL 0.5  0.7  0.7   Alkaline Phos 38 - 126 U/L  151  139  141   AST 15 - 41 U/L 53  50  23   ALT 0 - 44 U/L 63  56  36       RADIOGRAPHIC STUDIES: I have personally reviewed the radiological images as listed and agreed with the findings in the report. No results found.    Orders Placed This Encounter  Procedures   CBC with Differential ([place] Only)    Standing Status:   Future    Standing Expiration Date:   11/25/2023   CMP ([place] only)    Standing Status:   Future    Standing Expiration Date:   11/25/2023   Magnesium    Standing Status:   Future    Standing Expiration Date:   11/25/2023   CBC with Differential ([facility] Only)    Standing Status:   Future    Standing Expiration Date:   12/09/2023   CMP ([place] only)    Standing Status:   Future    Standing Expiration Date:   12/09/2023   Magnesium    Standing Status:   Future    Standing Expiration Date:   12/09/2023   All questions were answered. The patient knows to call the clinic with any problems, questions or concerns. No barriers to learning was detected. The total time spent in the appointment was 30 minutes.     Truitt Merle, MD 10/14/2022   Felicity Coyer, CMA, am acting as scribe for Truitt Merle, MD.   I have reviewed the above documentation for accuracy and completeness, and I agree with the above.

## 2022-10-16 ENCOUNTER — Inpatient Hospital Stay: Payer: Medicaid Other

## 2022-10-16 VITALS — BP 109/81 | HR 93 | Temp 99.2°F | Resp 18

## 2022-10-16 DIAGNOSIS — Z5111 Encounter for antineoplastic chemotherapy: Secondary | ICD-10-CM | POA: Diagnosis not present

## 2022-10-16 DIAGNOSIS — C186 Malignant neoplasm of descending colon: Secondary | ICD-10-CM

## 2022-10-16 MED ORDER — SODIUM CHLORIDE 0.9% FLUSH
10.0000 mL | INTRAVENOUS | Status: DC | PRN
  Administered 2022-10-16: 10 mL

## 2022-10-16 MED ORDER — HEPARIN SOD (PORK) LOCK FLUSH 100 UNIT/ML IV SOLN
500.0000 [IU] | Freq: Once | INTRAVENOUS | Status: AC | PRN
  Administered 2022-10-16: 500 [IU]

## 2022-10-26 ENCOUNTER — Ambulatory Visit (HOSPITAL_COMMUNITY)
Admission: RE | Admit: 2022-10-26 | Discharge: 2022-10-26 | Disposition: A | Discharge status: Home / Self Care | Payer: Medicaid Other | Source: Ambulatory Visit | Attending: Hematology | Admitting: Hematology

## 2022-10-26 ENCOUNTER — Encounter (HOSPITAL_COMMUNITY): Payer: Self-pay

## 2022-10-26 DIAGNOSIS — C186 Malignant neoplasm of descending colon: Secondary | ICD-10-CM | POA: Insufficient documentation

## 2022-10-26 MED ORDER — IOHEXOL 9 MG/ML PO SOLN
ORAL | Status: AC
  Filled 2022-10-26: qty 1000

## 2022-10-26 MED ORDER — SODIUM CHLORIDE (PF) 0.9 % IJ SOLN
INTRAMUSCULAR | Status: AC
  Filled 2022-10-26: qty 50

## 2022-10-26 MED ORDER — IOHEXOL 9 MG/ML PO SOLN
500.0000 mL | ORAL | Status: AC
  Administered 2022-10-26 (×2): 500 mL via ORAL

## 2022-10-26 MED ORDER — IOHEXOL 300 MG/ML  SOLN
100.0000 mL | Freq: Once | INTRAMUSCULAR | Status: AC | PRN
  Administered 2022-10-26: 100 mL via INTRAVENOUS

## 2022-10-27 MED FILL — Dexamethasone Sodium Phosphate Inj 100 MG/10ML: INTRAMUSCULAR | Qty: 1 | Status: AC

## 2022-10-27 NOTE — Progress Notes (Unsigned)
Patient Care Team: Medicine, [facility] as PCP - General Clovis Riley, MD as Consulting Physician (General Surgery) Truitt Merle, MD as Consulting Physician (Oncology)   CHIEF COMPLAINT: Follow-up metastatic colon cancer  Oncology History Overview Note   Cancer Staging  Cancer of left colon [facility]) Staging form: Colon and Rectum, AJCC 8th Edition - Pathologic stage from 08/08/2021: Stage IIIB (pT3, pN1a, cM0) - Signed by Truitt Merle, MD on 08/26/2021    Cancer of left colon ([place])  04/01/2020 Imaging   IMPRESSION: 1. Gallbladder decompressed bowel also partially calcified gallstones. No pericholecystic inflammation though if there is persisting clinical concern for cholecystitis right upper quadrant ultrasound could be obtained. 2. Circumferential thickening of the distal thoracic esophagus. Could reflect features of esophagitis. Correlate with clinical symptoms and consider endoscopy as clinically warranted. 3. Additional segmental thickening of the mid to distal sigmoid with focal narrowing. No acute surrounding inflammation or resulting obstruction. Findings are nonspecific, and could reflect sequela of prior inflammation/infection. However, recommend correlation with colonoscopy if not recently performed. 4. Mild circumferential bladder wall thickening and indentation of the bladder base by an enlarged prostate. Possibly sequela of chronic outlet obstruction though could correlate with urinalysis to exclude cystitis. 5. Aortic Atherosclerosis (ICD10-I70.0).   08/06/2021 Imaging   IMPRESSION: Sigmoid colonic perforation with small free intraperitoneal gas and infiltration of the mesenteric and omental fat in keeping with changes of peritonitis.   Long segment inflammatory stranding of the sigmoid colon in keeping with a severe infectious or inflammatory colitis. This terminates an area of irregular mural thickening, infiltrative soft tissue within the colonic  mesentery, and focal dystrophic calcification. This may represent a chronic inflammatory process, however, a perforated malignancy could appear similarly. There are 2 separate points of perforation which again raise the question of an underlying malignancy.   7.1 cm gas and fluid containing pericolonic abscess within the sigmoid mesentery.   Marked inflammatory change of the terminal ileum adjacent to the pericolonic abscess with resultant small bowel obstruction. Fluid within the distal esophagus likely relates to gastroesophageal reflux the setting of vomiting.   Aortic Atherosclerosis (ICD10-I70.0).   08/08/2021 Cancer Staging   Staging form: Colon and Rectum, AJCC 8th Edition - Pathologic stage from 08/08/2021: Stage IIIB (pT3, pN1a, cM0) - Signed by Truitt Merle, MD on 08/26/2021 Stage prefix: Initial diagnosis Total positive nodes: 1 Histologic grading system: 4 grade system Histologic grade (G): G2 Residual tumor (R): R0 - None   08/08/2021 Definitive Surgery   FINAL MICROSCOPIC DIAGNOSIS:   A. COLON, SIGMOID, PARTIAL COLECTOMY:  - Invasive moderately differentiated adenocarcinoma.  - Metastatic carcinoma involving one of twelve lymph nodes (1/12).  - See oncology table below.   ADDENDUM:  Mismatch Repair Protein ([facility])  SUMMARY INTERPRETATION: NORMAL    08/14/2021 Imaging   EXAM: CT ABDOMEN AND PELVIS WITH CONTRAST  IMPRESSION: 1. Post recent sigmoid colectomy with left lower quadrant colostomy. Two small residual foci of air within the pelvic mesentery with mild adjacent thickening, but no abscess or drainable collection. Trace non organized free fluid and stranding in the pelvis. 2. Short segment of small bowel wall thickening and inflammation in the pelvis involving the distal ileum, likely reactive. 3. Dilated distal esophagus, stomach, and small bowel, without discrete transition point, favoring postoperative ileus. 4. Small bilateral pleural effusions and compressive  atelectasis. 5. Heterogeneous partially enhancing 14 mm lymph node in the retroperitoneum at the aortoiliac bifurcation, not significantly changed from prior exam. Suspected additional lymph nodes in  the anterior common iliac space, not significantly changed from prior exam. Recommend attention at follow-up. 6. Additional chronic findings as described.     08/15/2021 Imaging   EXAM: CT CHEST WITH CONTRAST  IMPRESSION: 1. No evidence of thoracic metastasis. 2. Bilateral small layering pleural effusions with passive atelectasis   08/26/2021 Initial Diagnosis   Cancer of left colon ([place])   10/10/2021 - 12/12/2021 Chemotherapy   Patient is on Treatment Plan : COLORECTAL Xelox ([place]ox) q21d     05/28/2022 - 05/28/2022 Chemotherapy   Patient is on Treatment Plan : COLORECTAL FOLFIRI + Bevacizumab q14d     05/28/2022 -  Chemotherapy   Patient is on Treatment Plan : COLORECTAL FOLFIRI + Panitumumab q14d     07/31/2022 Imaging    IMPRESSION: Decreased bilateral pulmonary metastases.   Stable mild abdominal lymphadenopathy.   No new or progressive metastatic disease within the chest, abdomen, or pelvis.      CURRENT THERAPY: FOLFIRI starting 05/08/22 and Vectibix (added with cycle 2); q2 weeks   INTERVAL HISTORY Mr. Roupe returns with his mother for follow-up and treatment as scheduled, last seen by Dr. Burr Medico 10/14/2022 and completed cycle 11 FOLFIRI/Vectibix.  They both report he is doing well in general, with good energy and appetite.  He has 2 loose stools daily, no nausea/vomiting.  Denies pain.  Skin rash is mild, using topicals.  Denies any new or specific complaints.  ROS  All other systems reviewed and negative  Past Medical History:  Diagnosis Date   Cancer ([place])    Schizophrenia [facility])      Past Surgical History:  Procedure Laterality Date   BRONCHIAL BIOPSY  05/11/2022   Procedure: BRONCHIAL BIOPSIES;  Surgeon: Collene Gobble, MD;  Location[facility] ENDOSCOPY;  Service:  Pulmonary;;   BRONCHIAL BRUSHINGS  05/11/2022   Procedure: BRONCHIAL BRUSHINGS;  Surgeon: Collene Gobble, MD;  Location: [facility] ENDOSCOPY;  Service: Pulmonary;;   BRONCHIAL NEEDLE ASPIRATION BIOPSY  05/11/2022   Procedure: BRONCHIAL NEEDLE ASPIRATION BIOPSIES;  Surgeon: Collene Gobble, MD;  Location: [place];  Service: Pulmonary;;   BRONCHIAL WASHINGS  05/11/2022   Procedure: BRONCHIAL WASHINGS;  Surgeon: Collene Gobble, MD;  Location: [place];  Service: Pulmonary;;   IR IMAGING GUIDED PORT INSERTION  05/25/2022   LAPAROTOMY N/A 08/08/2021   Procedure: EXPLORATORY LAPAROTOMY;  Surgeon: Clovis Riley, MD;  Location: [place];  Service: General;  Laterality: N/A;   PARTIAL COLECTOMY N/A 08/08/2021   Procedure: PARTIAL COLECTOMY WITH COLOSTOMY;  Surgeon: Clovis Riley, MD;  Location: [place];  Service: General;  Laterality: N/A;   VIDEO BRONCHOSCOPY WITH RADIAL ENDOBRONCHIAL ULTRASOUND  05/11/2022   Procedure: VIDEO BRONCHOSCOPY WITH RADIAL ENDOBRONCHIAL ULTRASOUND;  Surgeon: Collene Gobble, MD;  Location: [facility] ENDOSCOPY;  Service: Pulmonary;;     Outpatient Encounter Medications as of 10/28/2022  Medication Sig Note   acetaminophen (TYLENOL) 500 MG tablet Take 2 tablets (1,000 mg total) by mouth every 6 (six) hours as needed for mild pain or moderate pain.    aspirin EC 81 MG tablet Take 81 mg by mouth daily as needed (for pain or headaches).     benztropine (COGENTIN) 1 MG tablet Take 1 mg at bedtime by mouth.    clindamycin (CLINDAGEL) 1 % gel Apply topically 2 (two) times daily. To skin rash on face and upper body    ferrous sulfate 325 (65 FE) MG EC tablet Take 1 tablet (325 mg total) by mouth daily.    magnesium  oxide (MAG-OX) 400 (240 Mg) MG tablet Take 1 tablet (400 mg total) by mouth daily.    Multiple Vitamin (MULTIVITAMIN WITH MINERALS) TABS tablet Take 1 tablet by mouth daily.    ondansetron (ZOFRAN) 8 MG tablet Take 1 tablet (8 mg total) by mouth every 8 (eight) hours as needed  for nausea or vomiting.    paliperidone (INVEGA SUSTENNA) 156 MG/ML SUSP injection Inject 156 mg every 30 (thirty) days into the muscle. 05/25/2022: Due tomorrow   polyethylene glycol (MIRALAX / GLYCOLAX) 17 g packet Take 17 g by mouth daily as needed for mild constipation or moderate constipation.    prochlorperazine (COMPAZINE) 10 MG tablet Take 1 tablet (10 mg total) by mouth every 6 (six) hours as needed for nausea or vomiting.    hydrocortisone cream 1 % Apply 1 Application topically 2 (two) times daily as needed for itching. For rash    No facility-administered encounter medications on file as of 10/28/2022.     Today's Vitals   10/28/22 0829 10/28/22 0830  BP: 113/65   Pulse: 88   Resp: 16   Temp: 98.5 F (36.9 C)   TempSrc: Oral   SpO2: 100%   Weight: 150 lb 8 oz (68.3 kg)   PainSc:  0-No pain   Body mass index is 22.22 kg/m.   PHYSICAL EXAM GENERAL:alert, no distress and comfortable SKIN: no rash  EYES: sclera clear LUNGS: clear with normal breathing effort HEART: regular rate & rhythm, no lower extremity edema ABDOMEN: abdomen soft, non-tender and normal bowel sounds.  Left abdominal colostomy noted NEURO: alert & oriented x 3 with fluent speech, no focal motor/sensory deficits PAC without erythema    CBC    Component Value Date/Time   WBC 3.6 (L) 10/28/2022 0802   WBC 5.2 05/11/2022 0801   RBC 4.13 (L) 10/28/2022 0802   HGB 12.1 (L) 10/28/2022 0802   HCT 35.6 (L) 10/28/2022 0802   PLT 313 10/28/2022 0802   MCV 86.2 10/28/2022 0802   MCH 29.3 10/28/2022 0802   MCHC 34.0 10/28/2022 0802   RDW 15.6 (H) 10/28/2022 0802   LYMPHSABS 0.9 10/28/2022 0802   MONOABS 0.3 10/28/2022 0802   EOSABS 0.2 10/28/2022 0802   BASOSABS 0.0 10/28/2022 0802     CMP     Component Value Date/Time   NA 140 10/28/2022 0802   K 3.7 10/28/2022 0802   CL 109 10/28/2022 0802   CO2 24 10/28/2022 0802   GLUCOSE 101 (H) 10/28/2022 0802   BUN 7 10/28/2022 0802   CREATININE 0.74  10/28/2022 0802   CALCIUM 9.0 10/28/2022 0802   PROT 6.7 10/28/2022 0802   ALBUMIN 3.8 10/28/2022 0802   AST 22 10/28/2022 0802   ALT 35 10/28/2022 0802   ALKPHOS 142 (H) 10/28/2022 0802   BILITOT 0.5 10/28/2022 0802   GFRNONAA >60 10/28/2022 0802   GFRAA >60 04/01/2020 1237     ASSESSMENT & PLAN:Braiden Ellerbe is a 57 y.o. male with    1. Cancer of left colon, stage IIIB p(T3, N1aM0), MSS -presented to ED on 08/06/21 with sigmoid colon perforation, s/p emergent partial colectomy on 08/08/21 by Dr. Windle Guard showed invasive moderately differentiated adenocarcinoma. Margins negative, one lymph node showed metastatic carcinoma (1/12). -Staging chest CT 08/15/21 was negative for metastatic disease. -he completed 4 cycles adjuvant CAPOX on 10/10/21 - May or July 2023 -Surveillance scan 01/2022 showed pulmonary nodules and retroperitoneal adenopathy, concerning for recurrent/metastatic disease -Lung biopsy confirmed malignant cells consistent with colonic primary -He  began first-line FOLFIRI in 05/2022, Vectibix added with cycle 2.  Tolerated well with good response on CT from 07/31/2022 and again with continued response in lungs and lymph nodes on CT CAP from 10/26/2022.  I reviewed with the patient and family -The recommendation is to proceed with maintenance 5-FU/leucovorin and Vectibix every 2 weeks, patient and his mother agree, start today -Follow-up in 2 weeks with next cycle   2. Anemia, likely iron deficient -chronic prior to diagnosis, worsened with surgery -he is currently on oral iron. -overall mild and stable    3. Schizophrenia, disabled, social support -he was diagnosed with schizophrenia in his 20's, managed with medication, he is on disability. -he lives with his mother; he is able to do ADLs for himself, but not iADLS. They are both on disability.     PLAN: -Staging CT scan 10/26/2022 and today's labs reviewed -Chang to maintenance 5-FU/leuc and Vectibix every 2 weeks, starting  from today -Follow-up in 2 weeks with cycle 2 maintenance 5FU/leuc + Vectibix    All questions were answered. The patient knows to call the clinic with any problems, questions or concerns. No barriers to learning were detected. I spent 20 minutes counseling the patient face to face. The total time spent in the appointment was 30 minutes and more than 50% was on counseling, review of test results, and coordination of care.   Cira Rue, NP-C 10/28/2022

## 2022-10-28 ENCOUNTER — Inpatient Hospital Stay: Payer: Medicaid Other

## 2022-10-28 ENCOUNTER — Other Ambulatory Visit: Payer: Self-pay

## 2022-10-28 ENCOUNTER — Inpatient Hospital Stay (HOSPITAL_BASED_OUTPATIENT_CLINIC_OR_DEPARTMENT_OTHER): Payer: Medicaid Other | Admitting: Nurse Practitioner

## 2022-10-28 ENCOUNTER — Encounter: Payer: Self-pay | Admitting: Nurse Practitioner

## 2022-10-28 DIAGNOSIS — C186 Malignant neoplasm of descending colon: Secondary | ICD-10-CM

## 2022-10-28 DIAGNOSIS — Z95828 Presence of other vascular implants and grafts: Secondary | ICD-10-CM

## 2022-10-28 DIAGNOSIS — Z5111 Encounter for antineoplastic chemotherapy: Secondary | ICD-10-CM | POA: Diagnosis not present

## 2022-10-28 DIAGNOSIS — C7800 Secondary malignant neoplasm of unspecified lung: Secondary | ICD-10-CM

## 2022-10-28 LAB — CBC WITH DIFFERENTIAL (CANCER CENTER ONLY)
Abs Immature Granulocytes: 0.01 10*3/uL (ref 0.00–0.07)
Basophils Absolute: 0 10*3/uL (ref 0.0–0.1)
Basophils Relative: 1 %
Eosinophils Absolute: 0.2 10*3/uL (ref 0.0–0.5)
Eosinophils Relative: 5 %
HCT: 35.6 % — ABNORMAL LOW (ref 39.0–52.0)
Hemoglobin: 12.1 g/dL — ABNORMAL LOW (ref 13.0–17.0)
Immature Granulocytes: 0 %
Lymphocytes Relative: 26 %
Lymphs Abs: 0.9 10*3/uL (ref 0.7–4.0)
MCH: 29.3 pg (ref 26.0–34.0)
MCHC: 34 g/dL (ref 30.0–36.0)
MCV: 86.2 fL (ref 80.0–100.0)
Monocytes Absolute: 0.3 10*3/uL (ref 0.1–1.0)
Monocytes Relative: 8 %
Neutro Abs: 2.1 10*3/uL (ref 1.7–7.7)
Neutrophils Relative %: 60 %
Platelet Count: 313 10*3/uL (ref 150–400)
RBC: 4.13 MIL/uL — ABNORMAL LOW (ref 4.22–5.81)
RDW: 15.6 % — ABNORMAL HIGH (ref 11.5–15.5)
WBC Count: 3.6 10*3/uL — ABNORMAL LOW (ref 4.0–10.5)
nRBC: 0 % (ref 0.0–0.2)

## 2022-10-28 LAB — CMP (CANCER CENTER ONLY)
ALT: 35 U/L (ref 0–44)
AST: 22 U/L (ref 15–41)
Albumin: 3.8 g/dL (ref 3.5–5.0)
Alkaline Phosphatase: 142 U/L — ABNORMAL HIGH (ref 38–126)
Anion gap: 7 (ref 5–15)
BUN: 7 mg/dL (ref 6–20)
CO2: 24 mmol/L (ref 22–32)
Calcium: 9 mg/dL (ref 8.9–10.3)
Chloride: 109 mmol/L (ref 98–111)
Creatinine: 0.74 mg/dL (ref 0.61–1.24)
GFR, Estimated: >60 mL/min (ref >60)
Glucose, Bld: 101 mg/dL — ABNORMAL HIGH (ref 70–99)
Potassium: 3.7 mmol/L (ref 3.5–5.1)
Sodium: 140 mmol/L (ref 135–145)
Total Bilirubin: 0.5 mg/dL (ref 0.3–1.2)
Total Protein: 6.7 g/dL (ref 6.5–8.1)

## 2022-10-28 LAB — MAGNESIUM: Magnesium: 1.7 mg/dL (ref 1.7–2.4)

## 2022-10-28 MED ORDER — SODIUM CHLORIDE 0.9 % IV SOLN
6.0000 mg/kg | Freq: Once | INTRAVENOUS | Status: AC
  Administered 2022-10-28: 400 mg via INTRAVENOUS
  Filled 2022-10-28: qty 20

## 2022-10-28 MED ORDER — SODIUM CHLORIDE 0.9% FLUSH
10.0000 mL | Freq: Once | INTRAVENOUS | Status: AC
  Administered 2022-10-28: 10 mL

## 2022-10-28 MED ORDER — SODIUM CHLORIDE 0.9 % IV SOLN: Freq: Once | INTRAVENOUS | Status: AC

## 2022-10-28 MED ORDER — SODIUM CHLORIDE 0.9 % IV SOLN
2400.0000 mg/m2 | INTRAVENOUS | Status: DC
  Administered 2022-10-28: 4350 mg via INTRAVENOUS
  Filled 2022-10-28: qty 87

## 2022-10-28 MED ORDER — SODIUM CHLORIDE 0.9 % IV SOLN
400.0000 mg/m2 | Freq: Once | INTRAVENOUS | Status: AC
  Administered 2022-10-28: 728 mg via INTRAVENOUS
  Filled 2022-10-28: qty 36.4

## 2022-10-28 MED ORDER — PALONOSETRON HCL INJECTION 0.25 MG/5ML
0.2500 mg | Freq: Once | INTRAVENOUS | Status: AC
  Administered 2022-10-28: 0.25 mg via INTRAVENOUS
  Filled 2022-10-28: qty 5

## 2022-10-28 MED ORDER — SODIUM CHLORIDE 0.9 % IV SOLN
10.0000 mg | Freq: Once | INTRAVENOUS | Status: AC
  Administered 2022-10-28: 10 mg via INTRAVENOUS
  Filled 2022-10-28: qty 10

## 2022-10-30 ENCOUNTER — Other Ambulatory Visit: Payer: Self-pay

## 2022-10-30 ENCOUNTER — Inpatient Hospital Stay: Payer: Medicaid Other

## 2022-10-30 VITALS — BP 124/70 | HR 118 | Temp 97.9°F | Resp 18

## 2022-10-30 DIAGNOSIS — Z5111 Encounter for antineoplastic chemotherapy: Secondary | ICD-10-CM | POA: Diagnosis not present

## 2022-10-30 DIAGNOSIS — C186 Malignant neoplasm of descending colon: Secondary | ICD-10-CM

## 2022-10-30 MED ORDER — SODIUM CHLORIDE 0.9% FLUSH
10.0000 mL | INTRAVENOUS | Status: DC | PRN
  Administered 2022-10-30: 10 mL

## 2022-10-30 MED ORDER — HEPARIN SOD (PORK) LOCK FLUSH 100 UNIT/ML IV SOLN
500.0000 [IU] | Freq: Once | INTRAVENOUS | Status: AC | PRN
  Administered 2022-10-30: 500 [IU]

## 2022-11-02 ENCOUNTER — Telehealth: Payer: Self-pay | Admitting: Hematology

## 2022-11-02 NOTE — Telephone Encounter (Signed)
Scheduled appointments per WQ. Left voicemail. 

## 2022-11-03 ENCOUNTER — Other Ambulatory Visit: Payer: Self-pay

## 2022-11-10 MED FILL — Dexamethasone Sodium Phosphate Inj 100 MG/10ML: INTRAMUSCULAR | Qty: 1 | Status: AC

## 2022-11-11 ENCOUNTER — Inpatient Hospital Stay: Payer: Medicaid Other

## 2022-11-11 ENCOUNTER — Inpatient Hospital Stay: Payer: Medicaid Other | Admitting: Hematology

## 2022-11-11 ENCOUNTER — Telehealth: Payer: Self-pay | Admitting: Hematology

## 2022-11-11 ENCOUNTER — Encounter: Payer: Self-pay | Admitting: Hematology

## 2022-11-11 DIAGNOSIS — C186 Malignant neoplasm of descending colon: Secondary | ICD-10-CM

## 2022-11-11 NOTE — Assessment & Plan Note (Deleted)
he was diagnosed with schizophrenia in his 20's, managed with medication, he is on disability. -he lives with his mother; he is able to do ADLs for himself, but not iADLS. They are both on disability. His mother also seems to have some kind of mental impairment 

## 2022-11-11 NOTE — Progress Notes (Signed)
Patient's mother called 11/10/22 in the pm to inquire about transportation for today's appointment. Advised her I would message Alcario Drought whom assists with our transportation for patients and have someone contact her. Message sent to [facility] whom sent to [facility] transportation coordinator to assist.  Patient's mother contacted me again this morning during the appointment time to ask about transportation coming. I again sent message to [place] and email to [place] for follow up. Alcario Drought communicated back as well as Ephriam Knuckles followed up with a phone call to me to explain process.  I sent staff message to Darl Pikes and Leotis Shames in social work for follow up due to need and patient's appointment had to be canceled and rescheduled.

## 2022-11-11 NOTE — Telephone Encounter (Signed)
Patients mom called to reschedule appointment transportation didnt pick them up today, rescheduled patients mom is aware.

## 2022-11-11 NOTE — Assessment & Plan Note (Deleted)
stage IIIB p(T3, N1aM0), MSS, KRAS/NRAS/BRAF wild type, lung and node metastasis in 04/2022  -diagnosed in 08/2021 -he completed 4 cycles adjuvant CAPOX 10/10/21 - 12/25/21 (though he somehow was still taking Xeloda through ~02/09/22 due to misunderstandings).  -he started FOLFIRI on 05/08/2022, Vectibix added from cycle 2, tolerating well overall -restaging CT scan image from August 02, 2022 showed good partial response in pulmonary metastasis, no other new lesions. -Will continue current treatment, will consider switching to maintenance therapy with 5-fu and vectibix after next restaging scan, or sooner if he develops significant neuropathy. -He is tolerating treatment very well, will continue. 

## 2022-11-12 ENCOUNTER — Encounter: Payer: Self-pay | Admitting: Hematology

## 2022-11-12 ENCOUNTER — Inpatient Hospital Stay: Payer: Medicaid Other

## 2022-11-12 ENCOUNTER — Inpatient Hospital Stay (HOSPITAL_BASED_OUTPATIENT_CLINIC_OR_DEPARTMENT_OTHER): Payer: Medicaid Other | Admitting: Hematology

## 2022-11-12 ENCOUNTER — Other Ambulatory Visit: Payer: Self-pay

## 2022-11-12 ENCOUNTER — Inpatient Hospital Stay: Payer: Medicaid Other | Attending: Hematology

## 2022-11-12 VITALS — BP 103/68 | HR 91 | Temp 97.8°F | Resp 18 | Ht 69.0 in | Wt 152.2 lb

## 2022-11-12 DIAGNOSIS — C186 Malignant neoplasm of descending colon: Secondary | ICD-10-CM

## 2022-11-12 DIAGNOSIS — C189 Malignant neoplasm of colon, unspecified: Secondary | ICD-10-CM

## 2022-11-12 DIAGNOSIS — Z5112 Encounter for antineoplastic immunotherapy: Secondary | ICD-10-CM | POA: Diagnosis present

## 2022-11-12 DIAGNOSIS — C7800 Secondary malignant neoplasm of unspecified lung: Secondary | ICD-10-CM | POA: Diagnosis not present

## 2022-11-12 DIAGNOSIS — Z79899 Other long term (current) drug therapy: Secondary | ICD-10-CM | POA: Insufficient documentation

## 2022-11-12 DIAGNOSIS — Z5111 Encounter for antineoplastic chemotherapy: Secondary | ICD-10-CM | POA: Insufficient documentation

## 2022-11-12 DIAGNOSIS — F209 Schizophrenia, unspecified: Secondary | ICD-10-CM | POA: Diagnosis not present

## 2022-11-12 DIAGNOSIS — Z95828 Presence of other vascular implants and grafts: Secondary | ICD-10-CM

## 2022-11-12 LAB — CMP (CANCER CENTER ONLY)
ALT: 18 U/L (ref 0–44)
AST: 16 U/L (ref 15–41)
Albumin: 3.8 g/dL (ref 3.5–5.0)
Alkaline Phosphatase: 129 U/L — ABNORMAL HIGH (ref 38–126)
Anion gap: 5 (ref 5–15)
BUN: 14 mg/dL (ref 6–20)
CO2: 25 mmol/L (ref 22–32)
Calcium: 9.1 mg/dL (ref 8.9–10.3)
Chloride: 108 mmol/L (ref 98–111)
Creatinine: 0.7 mg/dL (ref 0.61–1.24)
GFR, Estimated: >60 mL/min (ref >60)
Glucose, Bld: 100 mg/dL — ABNORMAL HIGH (ref 70–99)
Potassium: 3.8 mmol/L (ref 3.5–5.1)
Sodium: 138 mmol/L (ref 135–145)
Total Bilirubin: 0.5 mg/dL (ref 0.3–1.2)
Total Protein: 6.7 g/dL (ref 6.5–8.1)

## 2022-11-12 LAB — CBC WITH DIFFERENTIAL (CANCER CENTER ONLY)
Abs Immature Granulocytes: 0.02 10*3/uL (ref 0.00–0.07)
Basophils Absolute: 0 10*3/uL (ref 0.0–0.1)
Basophils Relative: 1 %
Eosinophils Absolute: 0.1 10*3/uL (ref 0.0–0.5)
Eosinophils Relative: 3 %
HCT: 36.1 % — ABNORMAL LOW (ref 39.0–52.0)
Hemoglobin: 12 g/dL — ABNORMAL LOW (ref 13.0–17.0)
Immature Granulocytes: 0 %
Lymphocytes Relative: 17 %
Lymphs Abs: 1 10*3/uL (ref 0.7–4.0)
MCH: 29.3 pg (ref 26.0–34.0)
MCHC: 33.2 g/dL (ref 30.0–36.0)
MCV: 88.3 fL (ref 80.0–100.0)
Monocytes Absolute: 0.5 10*3/uL (ref 0.1–1.0)
Monocytes Relative: 8 %
Neutro Abs: 4 10*3/uL (ref 1.7–7.7)
Neutrophils Relative %: 71 %
Platelet Count: 294 10*3/uL (ref 150–400)
RBC: 4.09 MIL/uL — ABNORMAL LOW (ref 4.22–5.81)
RDW: 16.3 % — ABNORMAL HIGH (ref 11.5–15.5)
WBC Count: 5.6 10*3/uL (ref 4.0–10.5)
nRBC: 0 % (ref 0.0–0.2)

## 2022-11-12 LAB — MAGNESIUM: Magnesium: 1.8 mg/dL (ref 1.7–2.4)

## 2022-11-12 MED ORDER — SODIUM CHLORIDE 0.9% FLUSH
10.0000 mL | Freq: Once | INTRAVENOUS | Status: AC
  Administered 2022-11-12: 10 mL

## 2022-11-12 MED ORDER — SODIUM CHLORIDE 0.9 % IV SOLN
400.0000 mg/m2 | Freq: Once | INTRAVENOUS | Status: AC
  Administered 2022-11-12: 728 mg via INTRAVENOUS
  Filled 2022-11-12: qty 25

## 2022-11-12 MED ORDER — SODIUM CHLORIDE 0.9 % IV SOLN
6.0000 mg/kg | Freq: Once | INTRAVENOUS | Status: AC
  Administered 2022-11-12: 400 mg via INTRAVENOUS
  Filled 2022-11-12: qty 20

## 2022-11-12 MED ORDER — SODIUM CHLORIDE 0.9 % IV SOLN: Freq: Once | INTRAVENOUS | Status: AC

## 2022-11-12 MED ORDER — PALONOSETRON HCL INJECTION 0.25 MG/5ML
0.2500 mg | Freq: Once | INTRAVENOUS | Status: AC
  Administered 2022-11-12: 0.25 mg via INTRAVENOUS
  Filled 2022-11-12: qty 5

## 2022-11-12 MED ORDER — SODIUM CHLORIDE 0.9 % IV SOLN
2400.0000 mg/m2 | INTRAVENOUS | Status: DC
  Administered 2022-11-12: 4350 mg via INTRAVENOUS
  Filled 2022-11-12: qty 87

## 2022-11-12 MED ORDER — SODIUM CHLORIDE 0.9 % IV SOLN
10.0000 mg | Freq: Once | INTRAVENOUS | Status: DC
Start: 2022-11-12 — End: 2022-11-12

## 2022-11-12 MED ORDER — SODIUM CHLORIDE 0.9 % IV SOLN
10.0000 mg | Freq: Once | INTRAVENOUS | Status: AC
  Administered 2022-11-12: 10 mg via INTRAVENOUS
  Filled 2022-11-12: qty 10

## 2022-11-12 NOTE — Progress Notes (Addendum)
[facility]   [phone] [phone]   Clinic Follow up Note   Patient Care Team: Medicine, [facility] as PCP - General Berna Bue, MD as Consulting Physician (General Surgery) Malachy Mood, MD as Consulting Physician (Oncology)  Date of Service:  11/12/2022  CHIEF COMPLAINT: f/u of metastatic colon cancer   CURRENT THERAPY: FOLFIRI starting 05/08/22 and Vectibix (added with cycle 2); q2 weeks      ASSESSMENT:  Matthew [name] is a 57 y.o. 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  -he started FOLFIRI on 05/08/2022, Vectibix added from cycle 2, tolerating well overall -restaging CT scan image from August 02, 2022 and 10/28/2022 both showed good partial response in pulmonary metastasis, no other new lesions. -we have changed his treatment to maintenance therapy with 5-fu and vectibix in late March 2024 -He is tolerating treatment very well, will continue.  Schizophrenia [facility]) he was diagnosed with schizophrenia in his 20's, managed with medication, he is on disability. -he lives with his mother; he is able to do ADLs for himself, but not iADLS. They are both on disability. His mother also seems to have some kind of mental impairment    PLAN: -recommend using Imodium as needed for diarrhea  -proceed with C13 5-FU/leuc and Vectibix  -lab/flush and  f/u and treatment 11/25/2022  SUMMARY OF ONCOLOGIC HISTORY: Oncology History Overview Note   Cancer Staging  Cancer of left colon [facility]) Staging form: Colon and Rectum, AJCC 8th Edition - Pathologic stage from 08/08/2021: Stage IIIB (pT3, pN1a, cM0) - Signed by Malachy Mood, MD on 08/26/2021    Cancer of left colon  04/01/2020 Imaging    IMPRESSION: 1. Gallbladder decompressed bowel also partially calcified gallstones. No pericholecystic inflammation though if there is persisting clinical concern for cholecystitis right upper quadrant ultrasound could be obtained. 2. Circumferential thickening of the distal thoracic esophagus. Could reflect features of esophagitis. Correlate with clinical symptoms and consider endoscopy as clinically warranted. 3. Additional segmental thickening of the mid to distal sigmoid with focal narrowing. No acute surrounding inflammation or resulting obstruction. Findings are nonspecific, and could reflect sequela of prior inflammation/infection. However, recommend correlation with colonoscopy if not recently performed. 4. Mild circumferential bladder wall thickening and indentation of the bladder base by an enlarged prostate. Possibly sequela of chronic outlet obstruction though could correlate with urinalysis to exclude cystitis. 5. Aortic Atherosclerosis (ICD10-I70.0).   08/06/2021 Imaging   IMPRESSION: Sigmoid colonic perforation with small free intraperitoneal gas and infiltration of the mesenteric and omental fat in keeping with changes of peritonitis.   Long segment inflammatory stranding of the sigmoid colon in keeping with a severe infectious or inflammatory colitis. This terminates an area of irregular mural thickening, infiltrative soft tissue within the colonic mesentery, and focal dystrophic calcification. This may represent a chronic inflammatory process, however, a perforated malignancy could appear similarly. There are 2 separate points of perforation which again raise the question of an underlying malignancy.   7.1 cm gas and fluid containing pericolonic abscess within the sigmoid mesentery.   Marked inflammatory change of the terminal ileum adjacent to the pericolonic abscess with resultant small bowel obstruction. Fluid within the distal esophagus likely relates to  gastroesophageal reflux the setting of vomiting.  Aortic Atherosclerosis (ICD10-I70.0).   08/08/2021 Cancer Staging   Staging form: Colon and Rectum, AJCC 8th Edition - Pathologic stage from 08/08/2021: Stage IIIB (pT3, pN1a, cM0) - Signed by Malachy Mood, MD on 08/26/2021 Stage prefix: Initial diagnosis Total positive nodes: 1 Histologic grading system: 4 grade system Histologic grade (G): G2 Residual tumor (R): R0 - None   08/08/2021 Definitive Surgery   FINAL MICROSCOPIC DIAGNOSIS:   A. COLON, SIGMOID, PARTIAL COLECTOMY:  - Invasive moderately differentiated adenocarcinoma.  - Metastatic carcinoma involving one of twelve lymph nodes (1/12).  - See oncology table below.   ADDENDUM:  Mismatch Repair Protein (IHC)  SUMMARY INTERPRETATION: NORMAL    08/14/2021 Imaging   EXAM: CT ABDOMEN AND PELVIS WITH CONTRAST  IMPRESSION: 1. Post recent sigmoid colectomy with left lower quadrant colostomy. Two small residual foci of air within the pelvic mesentery with mild adjacent thickening, but no abscess or drainable collection. Trace non organized free fluid and stranding in the pelvis. 2. Short segment of small bowel wall thickening and inflammation in the pelvis involving the distal ileum, likely reactive. 3. Dilated distal esophagus, stomach, and small bowel, without discrete transition point, favoring postoperative ileus. 4. Small bilateral pleural effusions and compressive atelectasis. 5. Heterogeneous partially enhancing 14 mm lymph node in the retroperitoneum at the aortoiliac bifurcation, not significantly changed from prior exam. Suspected additional lymph nodes in the anterior common iliac space, not significantly changed from prior exam. Recommend attention at follow-up. 6. Additional chronic findings as described.     08/15/2021 Imaging   EXAM: CT CHEST WITH CONTRAST  IMPRESSION: 1. No evidence of thoracic metastasis. 2. Bilateral small layering pleural effusions with  passive atelectasis   08/26/2021 Initial Diagnosis   Cancer of left colon (HCC)   10/10/2021 - 12/12/2021 Chemotherapy   Patient is on Treatment Plan : COLORECTAL Xelox ([place]ox) q21d     05/28/2022 - 05/28/2022 Chemotherapy   Patient is on Treatment Plan : COLORECTAL FOLFIRI + Bevacizumab q14d     05/28/2022 -  Chemotherapy   Patient is on Treatment Plan : COLORECTAL FOLFIRI + Panitumumab q14d     07/31/2022 Imaging    IMPRESSION: Decreased bilateral pulmonary metastases.   Stable mild abdominal lymphadenopathy.   No new or progressive metastatic disease within the chest, abdomen, or pelvis.      INTERVAL HISTORY:  Matthew [name] is here for a follow up ofmetastatic colon cancer .  He was last seen by NP Lacie on 10/28/2022. He presents to the clinic accompanied by mother.Pt state that he has diarrhea 2 x a day, watery consistent. Pt states that he is sleeping and eating good. Pt has no further concerns.    All other systems were reviewed with the patient and are negative.  MEDICAL HISTORY:  Past Medical History:  Diagnosis Date   Cancer    Schizophrenia     SURGICAL HISTORY: Past Surgical History:  Procedure Laterality Date   BRONCHIAL BIOPSY  05/11/2022   Procedure: BRONCHIAL BIOPSIES;  Surgeon: Leslye Peer, MD;  Location: [facility] ENDOSCOPY;  Service: Pulmonary;;   BRONCHIAL BRUSHINGS  05/11/2022   Procedure: BRONCHIAL BRUSHINGS;  Surgeon: Leslye Peer, MD;  Location[facility] ENDOSCOPY;  Service: Pulmonary;;   BRONCHIAL NEEDLE ASPIRATION BIOPSY  05/11/2022   Procedure: BRONCHIAL NEEDLE ASPIRATION BIOPSIES;  Surgeon: Leslye Peer, MD;  Location: [facility] ENDOSCOPY;  Service: Pulmonary;;   BRONCHIAL WASHINGS  05/11/2022   Procedure: BRONCHIAL WASHINGS;  Surgeon: Leslye Peer, MD;  Location: [facility] ENDOSCOPY;  Service:  Pulmonary;;   IR IMAGING GUIDED PORT INSERTION  05/25/2022   LAPAROTOMY N/A 08/08/2021   Procedure: EXPLORATORY LAPAROTOMY;  Surgeon: Berna Bue, MD;   Location: [facility] OR;  Service: General;  Laterality: N/A;   PARTIAL COLECTOMY N/A 08/08/2021   Procedure: PARTIAL COLECTOMY WITH COLOSTOMY;  Surgeon: Berna Bue, MD;  Location: [facility] OR;  Service: General;  Laterality: N/A;   VIDEO BRONCHOSCOPY WITH RADIAL ENDOBRONCHIAL ULTRASOUND  05/11/2022   Procedure: VIDEO BRONCHOSCOPY WITH RADIAL ENDOBRONCHIAL ULTRASOUND;  Surgeon: Leslye Peer, MD;  Location: [facility] ENDOSCOPY;  Service: Pulmonary;;    I have reviewed the social history and family history with the patient and they are unchanged from previous note.  ALLERGIES:  has No Known Allergies.  MEDICATIONS:  Current Outpatient Medications  Medication Sig Dispense Refill   acetaminophen (TYLENOL) 500 MG tablet Take 2 tablets (1,000 mg total) by mouth every 6 (six) hours as needed for mild pain or moderate pain.  0   aspirin EC 81 MG tablet Take 81 mg by mouth daily as needed (for pain or headaches).      benztropine (COGENTIN) 1 MG tablet Take 1 mg at bedtime by mouth.     clindamycin (CLINDAGEL) 1 % gel Apply topically 2 (two) times daily. To skin rash on face and upper body 30 g 0   ferrous sulfate 325 (65 FE) MG EC tablet Take 1 tablet (325 mg total) by mouth daily. 30 tablet 3   hydrocortisone cream 1 % Apply 1 Application topically 2 (two) times daily as needed for itching. For rash 30 g 2   magnesium oxide (MAG-OX) 400 (240 Mg) MG tablet Take 1 tablet (400 mg total) by mouth daily. 30 tablet 1   Multiple Vitamin (MULTIVITAMIN WITH MINERALS) TABS tablet Take 1 tablet by mouth daily.     ondansetron (ZOFRAN) 8 MG tablet Take 1 tablet (8 mg total) by mouth every 8 (eight) hours as needed for nausea or vomiting. 30 tablet 0   paliperidone (INVEGA SUSTENNA) 156 MG/ML SUSP injection Inject 156 mg every 30 (thirty) days into the muscle.     polyethylene glycol (MIRALAX / GLYCOLAX) 17 g packet Take 17 g by mouth daily as needed for mild constipation or moderate constipation.  0   prochlorperazine  (COMPAZINE) 10 MG tablet Take 1 tablet (10 mg total) by mouth every 6 (six) hours as needed for nausea or vomiting. 30 tablet 0   No current facility-administered medications for this visit.   Facility-Administered Medications Ordered in Other Visits  Medication Dose Route Frequency Provider Last Rate Last Admin   dexamethasone (DECADRON) 10 mg in sodium chloride 0.9 % 50 mL IVPB  10 mg Intravenous Once Malachy Mood, MD 204 mL/hr at 11/12/22 1224 10 mg at 11/12/22 1224   fluorouracil (ADRUCIL) 4,350 mg in sodium chloride 0.9 % 63 mL chemo infusion  2,400 mg/m2 (Treatment Plan Recorded) Intravenous 1 day or 1 dose Santiago Glad K, NP       leucovorin 728 mg in sodium chloride 0.9 % 250 mL infusion  400 mg/m2 (Treatment Plan Recorded) Intravenous Once Pollyann Samples, NP       panitumumab (VECTIBIX) 400 mg in sodium chloride 0.9 % 100 mL chemo infusion  6 mg/kg (Treatment Plan Recorded) Intravenous Once Malachy Mood, MD        PHYSICAL EXAMINATION: ECOG PERFORMANCE STATUS: 1 - Symptomatic but completely ambulatory  Vitals:   11/12/22 1145  BP: 103/68  Pulse: 91  Resp: 18  Temp: 97.8 F (36.6 C)  SpO2: 100%   Wt Readings from Last 3 Encounters:  11/12/22 152 lb 3.2 oz (69 kg)  10/28/22 150 lb 8 oz (68.3 kg)  10/14/22 151 lb 1.6 oz (68.5 kg)     GENERAL:alert, no distress and comfortable SKIN: skin color normal, no rashes or significant lesions EYES: normal, Conjunctiva are pink and non-injected, sclera clear  NEURO: alert & oriented x 3 with fluent speech  LABORATORY DATA:  I have reviewed the data as listed    Latest Ref Rng & Units 11/12/2022   11:05 AM 10/28/2022    8:02 AM 10/14/2022    8:37 AM  CBC  WBC 4.0 - 10.5 K/uL 5.6  3.6  4.4   Hemoglobin 13.0 - 17.0 g/dL 16.1  09.6  04.5   Hematocrit 39.0 - 52.0 % 36.1  35.6  35.1   Platelets 150 - 400 K/uL 294  313  307         Latest Ref Rng & Units 11/12/2022   11:05 AM 10/28/2022    8:02 AM 10/14/2022    8:37 AM  CMP  Glucose  70 - 99 mg/dL 409  811  914   BUN 6 - 20 mg/dL 14  7  10    Creatinine 0.61 - 1.24 mg/dL 7.82  9.56  2.13   Sodium 135 - 145 mmol/L 138  140  139   Potassium 3.5 - 5.1 mmol/L 3.8  3.7  3.5   Chloride 98 - 111 mmol/L 108  109  106   CO2 22 - 32 mmol/L 25  24  26    Calcium 8.9 - 10.3 mg/dL 9.1  9.0  9.0   Total Protein 6.5 - 8.1 g/dL 6.7  6.7  6.6   Total Bilirubin 0.3 - 1.2 mg/dL 0.5  0.5  0.6   Alkaline Phos 38 - 126 U/L 129  142  152   AST 15 - 41 U/L 16  22  32   ALT 0 - 44 U/L 18  35  55       RADIOGRAPHIC STUDIES: I have personally reviewed the radiological images as listed and agreed with the findings in the report. No results found.    Orders Placed This Encounter  Procedures   CBC with Differential (Cancer Center Only)    Standing Status:   Future    Standing Expiration Date:   12/23/2023   CMP (Cancer Center only)    Standing Status:   Future    Standing Expiration Date:   12/23/2023   Magnesium    Standing Status:   Future    Standing Expiration Date:   12/23/2023   All questions were answered. The patient knows to call the clinic with any problems, questions or concerns. No barriers to learning was detected. The total time spent in the appointment was 25 minutes.     Malachy Mood, MD 11/12/2022   Carolin Coy, CMA, am acting as scribe for Malachy Mood, MD.   I have reviewed the above documentation for accuracy and completeness, and I agree with the above.

## 2022-11-12 NOTE — Assessment & Plan Note (Signed)
he was diagnosed with schizophrenia in his 20's, managed with medication, he is on disability. -he lives with his mother; he is able to do ADLs for himself, but not iADLS. They are both on disability. His mother also seems to have some kind of mental impairment 

## 2022-11-12 NOTE — Assessment & Plan Note (Signed)
stage IIIB p(T3, N1aM0), MSS, KRAS/NRAS/BRAF wild type, lung and node metastasis in 04/2022  -diagnosed in 08/2021 -he completed 4 cycles adjuvant CAPOX 10/10/21 - 12/25/21 (though he somehow was still taking Xeloda through ~02/09/22 due to misunderstandings).  -he started FOLFIRI on 05/08/2022, Vectibix added from cycle 2, tolerating well overall -restaging CT scan image from August 02, 2022 showed good partial response in pulmonary metastasis, no other new lesions. -Will continue current treatment, will consider switching to maintenance therapy with 5-fu and vectibix after next restaging scan, or sooner if he develops significant neuropathy. -He is tolerating treatment very well, will continue. 

## 2022-11-13 ENCOUNTER — Other Ambulatory Visit: Payer: Self-pay

## 2022-11-14 ENCOUNTER — Inpatient Hospital Stay: Payer: Medicaid Other

## 2022-11-14 VITALS — BP 96/67 | HR 91 | Temp 97.7°F | Resp 18

## 2022-11-14 DIAGNOSIS — C186 Malignant neoplasm of descending colon: Secondary | ICD-10-CM

## 2022-11-14 DIAGNOSIS — Z5111 Encounter for antineoplastic chemotherapy: Secondary | ICD-10-CM | POA: Diagnosis not present

## 2022-11-14 MED ORDER — HEPARIN SOD (PORK) LOCK FLUSH 100 UNIT/ML IV SOLN
500.0000 [IU] | Freq: Once | INTRAVENOUS | Status: AC | PRN
  Administered 2022-11-14: 500 [IU]

## 2022-11-14 MED ORDER — SODIUM CHLORIDE 0.9% FLUSH
10.0000 mL | INTRAVENOUS | Status: DC | PRN
  Administered 2022-11-14: 10 mL

## 2022-11-22 NOTE — Progress Notes (Unsigned)
Patient Care Team: Medicine, [facility] as PCP - General Berna Bue, MD as Consulting Physician (General Surgery) Malachy Mood, MD as Consulting Physician (Oncology)   CHIEF COMPLAINT: Follow up metastatic colon cancer   Oncology History Overview Note   Cancer Staging  Cancer of left colon [facility]) Staging form: Colon and Rectum, AJCC 8th Edition - Pathologic stage from 08/08/2021: Stage IIIB (pT3, pN1a, cM0) - Signed by Malachy Mood, MD on 08/26/2021    Cancer of left colon  04/01/2020 Imaging   IMPRESSION: 1. Gallbladder decompressed bowel also partially calcified gallstones. No pericholecystic inflammation though if there is persisting clinical concern for cholecystitis right upper quadrant ultrasound could be obtained. 2. Circumferential thickening of the distal thoracic esophagus. Could reflect features of esophagitis. Correlate with clinical symptoms and consider endoscopy as clinically warranted. 3. Additional segmental thickening of the mid to distal sigmoid with focal narrowing. No acute surrounding inflammation or resulting obstruction. Findings are nonspecific, and could reflect sequela of prior inflammation/infection. However, recommend correlation with colonoscopy if not recently performed. 4. Mild circumferential bladder wall thickening and indentation of the bladder base by an enlarged prostate. Possibly sequela of chronic outlet obstruction though could correlate with urinalysis to exclude cystitis. 5. Aortic Atherosclerosis (ICD10-I70.0).   08/06/2021 Imaging   IMPRESSION: Sigmoid colonic perforation with small free intraperitoneal gas and infiltration of the mesenteric and omental fat in keeping with changes of peritonitis.   Long segment inflammatory stranding of the sigmoid colon in keeping with a severe infectious or inflammatory colitis. This terminates an area of irregular mural thickening, infiltrative soft tissue within the colonic  mesentery, and focal dystrophic calcification. This may represent a chronic inflammatory process, however, a perforated malignancy could appear similarly. There are 2 separate points of perforation which again raise the question of an underlying malignancy.   7.1 cm gas and fluid containing pericolonic abscess within the sigmoid mesentery.   Marked inflammatory change of the terminal ileum adjacent to the pericolonic abscess with resultant small bowel obstruction. Fluid within the distal esophagus likely relates to gastroesophageal reflux the setting of vomiting.   Aortic Atherosclerosis (ICD10-I70.0).   08/08/2021 Cancer Staging   Staging form: Colon and Rectum, AJCC 8th Edition - Pathologic stage from 08/08/2021: Stage IIIB (pT3, pN1a, cM0) - Signed by Malachy Mood, MD on 08/26/2021 Stage prefix: Initial diagnosis Total positive nodes: 1 Histologic grading system: 4 grade system Histologic grade (G): G2 Residual tumor (R): R0 - None   08/08/2021 Definitive Surgery   FINAL MICROSCOPIC DIAGNOSIS:   A. COLON, SIGMOID, PARTIAL COLECTOMY:  - Invasive moderately differentiated adenocarcinoma.  - Metastatic carcinoma involving one of twelve lymph nodes (1/12).  - See oncology table below.   ADDENDUM:  Mismatch Repair Protein (IHC)  SUMMARY INTERPRETATION: NORMAL    08/14/2021 Imaging   EXAM: CT ABDOMEN AND PELVIS WITH CONTRAST  IMPRESSION: 1. Post recent sigmoid colectomy with left lower quadrant colostomy. Two small residual foci of air within the pelvic mesentery with mild adjacent thickening, but no abscess or drainable collection. Trace non organized free fluid and stranding in the pelvis. 2. Short segment of small bowel wall thickening and inflammation in the pelvis involving the distal ileum, likely reactive. 3. Dilated distal esophagus, stomach, and small bowel, without discrete transition point, favoring postoperative ileus. 4. Small bilateral pleural effusions and compressive  atelectasis. 5. Heterogeneous partially enhancing 14 mm lymph node in the retroperitoneum at the aortoiliac bifurcation, not significantly changed from prior exam. Suspected additional lymph nodes  in the anterior common iliac space, not significantly changed from prior exam. Recommend attention at follow-up. 6. Additional chronic findings as described.     08/15/2021 Imaging   EXAM: CT CHEST WITH CONTRAST  IMPRESSION: 1. No evidence of thoracic metastasis. 2. Bilateral small layering pleural effusions with passive atelectasis   08/26/2021 Initial Diagnosis   Cancer of left colon (HCC)   10/10/2021 - 12/12/2021 Chemotherapy   Patient is on Treatment Plan : COLORECTAL Xelox (Capeox) q21d     05/28/2022 - 05/28/2022 Chemotherapy   Patient is on Treatment Plan : COLORECTAL FOLFIRI + Bevacizumab q14d     05/28/2022 -  Chemotherapy   Patient is on Treatment Plan : COLORECTAL FOLFIRI + Panitumumab q14d     07/31/2022 Imaging    IMPRESSION: Decreased bilateral pulmonary metastases.   Stable mild abdominal lymphadenopathy.   No new or progressive metastatic disease within the chest, abdomen, or pelvis.      CURRENT THERAPY: FOLFIRI starting 05/08/22 Vectibix (added with C2); q2 weeks; changed to maintenance 5FU/leuc and vectibix 10/28/22  INTERVAL HISTORY Mr. Stegman returns for follow up as scheduled. Last seen by Dr. Mosetta Putt 4/11 and completed cycle 2 maintenance 44fu/leuc and vectibix.  He is doing well, no significant side effects except periodic loose stool.  Eating and drinking well, denies pain, neuropathy, or any other new specific complaints.  ROS  All other systems reviewed and negative  Past Medical History:  Diagnosis Date   Cancer    Schizophrenia      Past Surgical History:  Procedure Laterality Date   BRONCHIAL BIOPSY  05/11/2022   Procedure: BRONCHIAL BIOPSIES;  Surgeon: Leslye Peer, MD;  Location: [facility] ENDOSCOPY;  Service: Pulmonary;;   BRONCHIAL BRUSHINGS   05/11/2022   Procedure: BRONCHIAL BRUSHINGS;  Surgeon: Leslye Peer, MD;  Location: [facility] ENDOSCOPY;  Service: Pulmonary;;   BRONCHIAL NEEDLE ASPIRATION BIOPSY  05/11/2022   Procedure: BRONCHIAL NEEDLE ASPIRATION BIOPSIES;  Surgeon: Leslye Peer, MD;  Location: [facility] ENDOSCOPY;  Service: Pulmonary;;   BRONCHIAL WASHINGS  05/11/2022   Procedure: BRONCHIAL WASHINGS;  Surgeon: Leslye Peer, MD;  Location: [facility] ENDOSCOPY;  Service: Pulmonary;;   IR IMAGING GUIDED PORT INSERTION  05/25/2022   LAPAROTOMY N/A 08/08/2021   Procedure: EXPLORATORY LAPAROTOMY;  Surgeon: Berna Bue, MD;  Location: [facility] OR;  Service: General;  Laterality: N/A;   PARTIAL COLECTOMY N/A 08/08/2021   Procedure: PARTIAL COLECTOMY WITH COLOSTOMY;  Surgeon: Berna Bue, MD;  Location: [facility] OR;  Service: General;  Laterality: N/A;   VIDEO BRONCHOSCOPY WITH RADIAL ENDOBRONCHIAL ULTRASOUND  05/11/2022   Procedure: VIDEO BRONCHOSCOPY WITH RADIAL ENDOBRONCHIAL ULTRASOUND;  Surgeon: Leslye Peer, MD;  Location: [facility] ENDOSCOPY;  Service: Pulmonary;;     Outpatient Encounter Medications as of 11/25/2022  Medication Sig Note   acetaminophen (TYLENOL) 500 MG tablet Take 2 tablets (1,000 mg total) by mouth every 6 (six) hours as needed for mild pain or moderate pain.    aspirin EC 81 MG tablet Take 81 mg by mouth daily as needed (for pain or headaches).     benztropine (COGENTIN) 1 MG tablet Take 1 mg at bedtime by mouth.    clindamycin (CLINDAGEL) 1 % gel Apply topically 2 (two) times daily. To skin rash on face and upper body    ferrous sulfate 325 (65 FE) MG EC tablet Take 1 tablet (325 mg total) by mouth daily.    hydrocortisone cream 1 % Apply 1 Application topically 2 (two) times daily as  needed for itching. For rash    loperamide (IMODIUM) 2 MG capsule Take 1 capsule (2 mg total) by mouth as needed for diarrhea or loose stools.    magnesium oxide (MAG-OX) 400 (240 Mg) MG tablet Take 1 tablet (400 mg total) by mouth daily.     Multiple Vitamin (MULTIVITAMIN WITH MINERALS) TABS tablet Take 1 tablet by mouth daily.    ondansetron (ZOFRAN) 8 MG tablet Take 1 tablet (8 mg total) by mouth every 8 (eight) hours as needed for nausea or vomiting.    paliperidone (INVEGA SUSTENNA) 156 MG/ML SUSP injection Inject 156 mg every 30 (thirty) days into the muscle. 05/25/2022: Due tomorrow   polyethylene glycol (MIRALAX / GLYCOLAX) 17 g packet Take 17 g by mouth daily as needed for mild constipation or moderate constipation.    prochlorperazine (COMPAZINE) 10 MG tablet Take 1 tablet (10 mg total) by mouth every 6 (six) hours as needed for nausea or vomiting.    No facility-administered encounter medications on file as of 11/25/2022.     Today's Vitals   11/25/22 1110  BP: 102/70  Pulse: 80  Resp: 14  Temp: (!) 97.5 F (36.4 C)  TempSrc: Temporal  SpO2: 100%  Weight: 152 lb 4.8 oz (69.1 kg)  Height: 5\' 9"  (1.753 m)   Body mass index is 22.49 kg/m.   PHYSICAL EXAM GENERAL:alert, no distress and comfortable SKIN: no rash  EYES: sclera clear LUNGS:  normal breathing effort HEART: no lower extremity edema ABDOMEN: abdomen soft, non-tender and normal bowel sounds NEURO: alert with fluent speech PAC without erythema    CBC    Component Value Date/Time   WBC 4.4 11/25/2022 1055   WBC 5.2 05/11/2022 0801   RBC 4.12 (L) 11/25/2022 1055   HGB 12.2 (L) 11/25/2022 1055   HCT 35.8 (L) 11/25/2022 1055   PLT 294 11/25/2022 1055   MCV 86.9 11/25/2022 1055   MCH 29.6 11/25/2022 1055   MCHC 34.1 11/25/2022 1055   RDW 16.1 (H) 11/25/2022 1055   LYMPHSABS 1.0 11/25/2022 1055   MONOABS 0.4 11/25/2022 1055   EOSABS 0.1 11/25/2022 1055   BASOSABS 0.0 11/25/2022 1055     CMP     Component Value Date/Time   NA 138 11/12/2022 1105   K 3.8 11/12/2022 1105   CL 108 11/12/2022 1105   CO2 25 11/12/2022 1105   GLUCOSE 100 (H) 11/12/2022 1105   BUN 14 11/12/2022 1105   CREATININE 0.70 11/12/2022 1105   CALCIUM 9.1 11/12/2022  1105   PROT 6.7 11/12/2022 1105   ALBUMIN 3.8 11/12/2022 1105   AST 16 11/12/2022 1105   ALT 18 11/12/2022 1105   ALKPHOS 129 (H) 11/12/2022 1105   BILITOT 0.5 11/12/2022 1105   GFRNONAA >60 11/12/2022 1105   GFRAA >60 04/01/2020 1237     ASSESSMENT & PLAN:Matthew [name] is a 57 y.o. male with    1. Cancer of left colon, stage IIIB p(T3, N1aM0), MSS -presented to ED on 08/06/21 with sigmoid colon perforation, s/p emergent partial colectomy on 08/08/21 by Dr. Doylene Canard showed invasive moderately differentiated adenocarcinoma. Margins negative, one lymph node showed metastatic carcinoma (1/12). -Staging chest CT 08/15/21 was negative for metastatic disease. -he completed 4 cycles adjuvant CAPOX on 10/10/21 - May or July 2023 -Surveillance scan 01/2022 showed pulmonary nodules and retroperitoneal adenopathy, concerning for recurrent/metastatic disease -Lung biopsy confirmed malignant cells consistent with colonic primary -He began first-line FOLFIRI in 05/2022, Vectibix added with cycle 2.  Tolerated well with good  response on CT from 07/31/2022 and again with continued response in lungs and lymph nodes on CT CAP from 10/26/2022.   -Changed to maintenance 5FU/leuc and vectibix on 10/28/22, s/p 2 cycles -Mr. Elzey appears stable.  Tolerating 5-FU/leuc and Vectibix well overall, with periodic loose stool.  I prescribed Imodium PRN.  Otherwise tolerating well.  No clinical concern for progression. -CBC reviewed, CMP/mag are pending.  If within parameters, proceed with cycle 3 maintenance 5FU/leuc and vectibix today as scheduled -F/up and cycle 4 in 2 weeks   2. Anemia, likely iron deficient -chronic prior to diagnosis, worsened with surgery -he is currently on oral iron. -overall mild and stable    3. Schizophrenia, disabled, social support -he was diagnosed with schizophrenia in his 20's, managed with medication, he is on disability. -he lives with his mother; he is able to do ADLs for himself, but not  iADLS. They are both on disability.     PLAN: -CBC reviewed, CMP/Mg are pending -Proceed with cycle 3 maintenance 5FU/leuc and vectibix today as planned, same dose -F/up and cycle 4 in 2 weeks -Rx: Imodium   All questions were answered. The patient knows to call the clinic with any problems, questions or concerns. No barriers to learning were detected.    Santiago Glad, NP-C 11/25/2022

## 2022-11-24 MED FILL — Dexamethasone Sodium Phosphate Inj 100 MG/10ML: INTRAMUSCULAR | Qty: 1 | Status: AC

## 2022-11-25 ENCOUNTER — Inpatient Hospital Stay: Payer: Medicaid Other

## 2022-11-25 ENCOUNTER — Other Ambulatory Visit: Payer: Self-pay | Admitting: Hematology

## 2022-11-25 ENCOUNTER — Other Ambulatory Visit: Payer: Self-pay

## 2022-11-25 ENCOUNTER — Inpatient Hospital Stay (HOSPITAL_BASED_OUTPATIENT_CLINIC_OR_DEPARTMENT_OTHER): Payer: Medicaid Other | Admitting: Nurse Practitioner

## 2022-11-25 ENCOUNTER — Encounter: Payer: Self-pay | Admitting: Nurse Practitioner

## 2022-11-25 ENCOUNTER — Inpatient Hospital Stay: Payer: Medicaid Other | Admitting: Licensed Clinical Social Worker

## 2022-11-25 DIAGNOSIS — Z95828 Presence of other vascular implants and grafts: Secondary | ICD-10-CM

## 2022-11-25 DIAGNOSIS — C186 Malignant neoplasm of descending colon: Secondary | ICD-10-CM

## 2022-11-25 DIAGNOSIS — Z5111 Encounter for antineoplastic chemotherapy: Secondary | ICD-10-CM | POA: Diagnosis not present

## 2022-11-25 DIAGNOSIS — C189 Malignant neoplasm of colon, unspecified: Secondary | ICD-10-CM

## 2022-11-25 LAB — CBC WITH DIFFERENTIAL (CANCER CENTER ONLY)
Abs Immature Granulocytes: 0.01 10*3/uL (ref 0.00–0.07)
Basophils Absolute: 0 10*3/uL (ref 0.0–0.1)
Basophils Relative: 1 %
Eosinophils Absolute: 0.1 10*3/uL (ref 0.0–0.5)
Eosinophils Relative: 3 %
HCT: 35.8 % — ABNORMAL LOW (ref 39.0–52.0)
Hemoglobin: 12.2 g/dL — ABNORMAL LOW (ref 13.0–17.0)
Immature Granulocytes: 0 %
Lymphocytes Relative: 23 %
Lymphs Abs: 1 10*3/uL (ref 0.7–4.0)
MCH: 29.6 pg (ref 26.0–34.0)
MCHC: 34.1 g/dL (ref 30.0–36.0)
MCV: 86.9 fL (ref 80.0–100.0)
Monocytes Absolute: 0.4 10*3/uL (ref 0.1–1.0)
Monocytes Relative: 10 %
Neutro Abs: 2.8 10*3/uL (ref 1.7–7.7)
Neutrophils Relative %: 63 %
Platelet Count: 294 10*3/uL (ref 150–400)
RBC: 4.12 MIL/uL — ABNORMAL LOW (ref 4.22–5.81)
RDW: 16.1 % — ABNORMAL HIGH (ref 11.5–15.5)
WBC Count: 4.4 10*3/uL (ref 4.0–10.5)
nRBC: 0 % (ref 0.0–0.2)

## 2022-11-25 LAB — CMP (CANCER CENTER ONLY)
ALT: 37 U/L (ref 0–44)
AST: 24 U/L (ref 15–41)
Albumin: 4 g/dL (ref 3.5–5.0)
Alkaline Phosphatase: 130 U/L — ABNORMAL HIGH (ref 38–126)
Anion gap: 6 (ref 5–15)
BUN: 13 mg/dL (ref 6–20)
CO2: 26 mmol/L (ref 22–32)
Calcium: 9.3 mg/dL (ref 8.9–10.3)
Chloride: 105 mmol/L (ref 98–111)
Creatinine: 0.66 mg/dL (ref 0.61–1.24)
GFR, Estimated: >60 mL/min (ref >60)
Glucose, Bld: 97 mg/dL (ref 70–99)
Potassium: 3.3 mmol/L — ABNORMAL LOW (ref 3.5–5.1)
Sodium: 137 mmol/L (ref 135–145)
Total Bilirubin: 0.5 mg/dL (ref 0.3–1.2)
Total Protein: 6.8 g/dL (ref 6.5–8.1)

## 2022-11-25 LAB — MAGNESIUM: Magnesium: 1.6 mg/dL — ABNORMAL LOW (ref 1.7–2.4)

## 2022-11-25 MED ORDER — SODIUM CHLORIDE 0.9 % IV SOLN
400.0000 mg/m2 | Freq: Once | INTRAVENOUS | Status: AC
  Administered 2022-11-25: 728 mg via INTRAVENOUS
  Filled 2022-11-25: qty 25

## 2022-11-25 MED ORDER — SODIUM CHLORIDE 0.9 % IV SOLN: Freq: Once | INTRAVENOUS | Status: AC

## 2022-11-25 MED ORDER — SODIUM CHLORIDE 0.9 % IV SOLN
10.0000 mg | Freq: Once | INTRAVENOUS | Status: AC
  Administered 2022-11-25: 10 mg via INTRAVENOUS
  Filled 2022-11-25: qty 10

## 2022-11-25 MED ORDER — PALONOSETRON HCL INJECTION 0.25 MG/5ML
0.2500 mg | Freq: Once | INTRAVENOUS | Status: AC
  Administered 2022-11-25: 0.25 mg via INTRAVENOUS
  Filled 2022-11-25: qty 5

## 2022-11-25 MED ORDER — LOPERAMIDE HCL 2 MG PO CAPS: 2.0000 mg | ORAL_CAPSULE | ORAL | 0 refills | Status: AC | PRN

## 2022-11-25 MED ORDER — SODIUM CHLORIDE 0.9 % IV SOLN
6.0000 mg/kg | Freq: Once | INTRAVENOUS | Status: AC
  Administered 2022-11-25: 400 mg via INTRAVENOUS
  Filled 2022-11-25: qty 20

## 2022-11-25 MED ORDER — SODIUM CHLORIDE 0.9% FLUSH
10.0000 mL | Freq: Once | INTRAVENOUS | Status: AC
  Administered 2022-11-25: 10 mL

## 2022-11-25 MED ORDER — SODIUM CHLORIDE 0.9 % IV SOLN
2400.0000 mg/m2 | INTRAVENOUS | Status: DC
  Administered 2022-11-25: 4350 mg via INTRAVENOUS
  Filled 2022-11-25: qty 87

## 2022-11-25 NOTE — Patient Instructions (Signed)
[facility]  Discharge Instructions: Thank you for choosing [facility] to provide your oncology and hematology care.   If you have a lab appointment with the Cancer Center, please go directly to the Cancer Center and check in at the registration area.   Wear comfortable clothing and clothing appropriate for easy access to any Portacath or PICC line.   We strive to give you quality time with your provider. You may need to reschedule your appointment if you arrive late (15 or more minutes).  Arriving late affects you and other patients whose appointments are after yours.  Also, if you miss three or more appointments without notifying the office, you may be dismissed from the clinic at the provider's discretion.      For prescription refill requests, have your pharmacy contact our office and allow 72 hours for refills to be completed.    Today you received the following chemotherapy and/or immunotherapy agents: Vectibix, Irinotecan, Leucovorin, Fluorouracil.       To help prevent nausea and vomiting after your treatment, we encourage you to take your nausea medication as directed.  BELOW ARE SYMPTOMS THAT SHOULD BE REPORTED IMMEDIATELY: *FEVER GREATER THAN 100.4 F (38 C) OR HIGHER *CHILLS OR SWEATING *NAUSEA AND VOMITING THAT IS NOT CONTROLLED WITH YOUR NAUSEA MEDICATION *UNUSUAL SHORTNESS OF BREATH *UNUSUAL BRUISING OR BLEEDING *URINARY PROBLEMS (pain or burning when urinating, or frequent urination) *BOWEL PROBLEMS (unusual diarrhea, constipation, pain near the anus) TENDERNESS IN MOUTH AND THROAT WITH OR WITHOUT PRESENCE OF ULCERS (sore throat, sores in mouth, or a toothache) UNUSUAL RASH, SWELLING OR PAIN  UNUSUAL VAGINAL DISCHARGE OR ITCHING   Items with * indicate a potential emergency and should be followed up as soon as possible or go to the Emergency Department if any problems should occur.  Please show the CHEMOTHERAPY ALERT  CARD or IMMUNOTHERAPY ALERT CARD at check-in to the Emergency Department and triage nurse.  Should you have questions after your visit or need to cancel or reschedule your appointment, please contact [facility]  Dept: [phone]  and follow the prompts.  Office hours are 8:00 a.m. to 4:30 p.m. Monday - Friday. Please note that voicemails left after 4:00 p.m. may not be returned until the following business day.  We are closed weekends and major holidays. You have access to a nurse at all times for urgent questions. Please call the main number to the clinic Dept: [phone] and follow the prompts.   For any non-urgent questions, you may also contact your provider using MyChart. We now offer e-Visits for anyone 18 and older to request care online for non-urgent symptoms. For details visit [email or web address].   Also download the MyChart app! Go to the app store, search "MyChart", open the app, select [facility], and log in with your MyChart username and password.   

## 2022-11-25 NOTE — Progress Notes (Signed)
Ok to treat today per Dr. Mosetta Putt with Mag of 1.6

## 2022-11-25 NOTE — Progress Notes (Signed)
[facility] CSW Progress Note  Visual merchandiser  met w/ pt and mother in infusion to inquire about difficulties regarding transportation.  Per pt's mother, she and her son reside together.  Pt receives SSI and his mother receives disability.  Neither drive and they do not have family close by who can assist w/ transportation.  Pt does have a brother and sister, but they reportedly are not local.  Pt's mother states she was aware pt had transportation benefits through Medicaid, but did not realize the appointments had to be booked three days in advance.  When pt was then connected to hospital transportation it became confusing as to who should be called for transport.  CSW advised pt's mother to book pt's Medicaid transportation a month at a time each time she receives a monthly schedule from the cancer center.  If emergent appointments arise or changes need to be made last minute, the hospital's transportation can be used.  Pt's mother verbalized agreement.  CSW provided pt w/ a bag of groceries from the pantry and informed pt's mother that a food bag is available to pt if he needs whenever he has an appointment.  Pt's mother to call CSW w/ the contact information for pt's case manager to investigate if pt may be eligible for additional services.  Pt's mother does not recall applying for the [employer]; however, it appears pt was approved in November for the grant.  CSW sent a message to patient financial resource specialist to inquire if any funds have been used.  If not request made for patient financial resource specialist to contact pt's mother to explain how to utilize those funds.  CSW to remain available as appropriate throughout duration of treatment.        Rachel Moulds, LCSW    Patient is participating in a Managed Medicaid Plan:  Yes

## 2022-11-26 ENCOUNTER — Telehealth: Payer: Self-pay

## 2022-11-26 ENCOUNTER — Other Ambulatory Visit: Payer: Self-pay | Admitting: Nurse Practitioner

## 2022-11-26 MED ORDER — POTASSIUM CHLORIDE CRYS ER 20 MEQ PO TBCR: 20.0000 meq | EXTENDED_RELEASE_TABLET | Freq: Two times a day (BID) | ORAL | 0 refills | Status: DC

## 2022-11-26 NOTE — Telephone Encounter (Addendum)
Called and spoke with patients mother relayed the message below as per Santiago Glad NP. She voiced that she understood and would call us back if she had any questions after picking up the medication.   ----- Message from Pollyann Samples, NP sent at 11/26/2022 12:14 PM EDT ----- Please call pt's mother, let her know K and Mg were low yesterday. I sent in potassium Rx to [facility]. Please take potassium 1 tab BID and oral Mag once daily.   Thanks, [name] NP

## 2022-11-27 ENCOUNTER — Other Ambulatory Visit: Payer: Self-pay

## 2022-11-27 ENCOUNTER — Inpatient Hospital Stay: Payer: Medicaid Other

## 2022-11-27 VITALS — BP 93/69 | HR 100 | Temp 98.4°F | Resp 16

## 2022-11-27 DIAGNOSIS — C186 Malignant neoplasm of descending colon: Secondary | ICD-10-CM

## 2022-11-27 DIAGNOSIS — Z5111 Encounter for antineoplastic chemotherapy: Secondary | ICD-10-CM | POA: Diagnosis not present

## 2022-11-27 MED ORDER — SODIUM CHLORIDE 0.9% FLUSH
10.0000 mL | INTRAVENOUS | Status: DC | PRN
  Administered 2022-11-27: 10 mL

## 2022-11-27 MED ORDER — HEPARIN SOD (PORK) LOCK FLUSH 100 UNIT/ML IV SOLN
500.0000 [IU] | Freq: Once | INTRAVENOUS | Status: AC | PRN
  Administered 2022-11-27: 500 [IU]

## 2022-12-08 MED FILL — Dexamethasone Sodium Phosphate Inj 100 MG/10ML: INTRAMUSCULAR | Qty: 1 | Status: AC

## 2022-12-08 NOTE — Progress Notes (Addendum)
[facility]   [phone] Fax:([phone]   Clinic Follow up Note   Patient Care Team: Medicine, [facility] as PCP - General Berna Bue, MD as Consulting Physician (General Surgery) Malachy Mood, MD as Consulting Physician (Oncology)  Date of Service:  12/09/2022  CHIEF COMPLAINT: f/u of metastatic colon cancer   CURRENT THERAPY:  FOLFIRI starting 05/08/22 Vectibix (added with C2); q2 weeks; changed to maintenance 5FU/leuc and vectibix 10/28/22    ASSESSMENT:  Matthew [name] is a 57 y.o. 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  -he started FOLFIRI on 05/08/2022, Vectibix added from cycle 2, tolerating well overall -restaging CT scan image from August 02, 2022 and 10/28/2022 both showed good partial response in pulmonary metastasis, no other new lesions. -we have changed his treatment to maintenance therapy with 5-fu and vectibix in late March 2024 -He is tolerating treatment very well, will continue.  Schizophrenia [facility]) he was diagnosed with schizophrenia in his 20's, managed with medication, he is on disability. -he lives with his mother; he is able to do ADLs for himself, but not iADLS. They are both on disability. His mother also seems to have some kind of mental impairment    PLAN: -lCBC reviewed -proceed with 5FU/leuc and vectibix today as planned, same dose  -Repeat scan in JUNE, will order next visit -lab/flush and treatment 5/22   SUMMARY OF ONCOLOGIC HISTORY: Oncology History Overview Note   Cancer Staging  Cancer of left colon [facility]) Staging form: Colon and Rectum, AJCC 8th Edition - Pathologic stage from 08/08/2021: Stage IIIB (pT3, pN1a, cM0) - Signed by Malachy Mood,  MD on 08/26/2021    Cancer of left colon (HCC)  04/01/2020 Imaging   IMPRESSION: 1. Gallbladder decompressed bowel also partially calcified gallstones. No pericholecystic inflammation though if there is persisting clinical concern for cholecystitis right upper quadrant ultrasound could be obtained. 2. Circumferential thickening of the distal thoracic esophagus. Could reflect features of esophagitis. Correlate with clinical symptoms and consider endoscopy as clinically warranted. 3. Additional segmental thickening of the mid to distal sigmoid with focal narrowing. No acute surrounding inflammation or resulting obstruction. Findings are nonspecific, and could reflect sequela of prior inflammation/infection. However, recommend correlation with colonoscopy if not recently performed. 4. Mild circumferential bladder wall thickening and indentation of the bladder base by an enlarged prostate. Possibly sequela of chronic outlet obstruction though could correlate with urinalysis to exclude cystitis. 5. Aortic Atherosclerosis (ICD10-I70.0).   08/06/2021 Imaging   IMPRESSION: Sigmoid colonic perforation with small free intraperitoneal gas and infiltration of the mesenteric and omental fat in keeping with changes of peritonitis.   Long segment inflammatory stranding of the sigmoid colon in keeping with a severe infectious or inflammatory colitis. This terminates an area of irregular mural thickening, infiltrative soft tissue within the colonic mesentery, and focal dystrophic calcification. This may represent a chronic inflammatory process, however, a perforated malignancy could appear similarly. There are 2 separate points of perforation which again raise the question of an underlying malignancy.   7.1 cm gas and fluid containing pericolonic abscess within the sigmoid mesentery.   Marked inflammatory change of the terminal ileum adjacent to the pericolonic abscess with resultant small bowel  obstruction. Fluid within the distal esophagus likely  relates to gastroesophageal reflux the setting of vomiting.   Aortic Atherosclerosis (ICD10-I70.0).   08/08/2021 Cancer Staging   Staging form: Colon and Rectum, AJCC 8th Edition - Pathologic stage from 08/08/2021: Stage IIIB (pT3, pN1a, cM0) - Signed by Malachy Mood, MD on 08/26/2021 Stage prefix: Initial diagnosis Total positive nodes: 1 Histologic grading system: 4 grade system Histologic grade (G): G2 Residual tumor (R): R0 - None   08/08/2021 Definitive Surgery   FINAL MICROSCOPIC DIAGNOSIS:   A. COLON, SIGMOID, PARTIAL COLECTOMY:  - Invasive moderately differentiated adenocarcinoma.  - Metastatic carcinoma involving one of twelve lymph nodes (1/12).  - See oncology table below.   ADDENDUM:  Mismatch Repair Protein (IHC)  SUMMARY INTERPRETATION: NORMAL    08/14/2021 Imaging   EXAM: CT ABDOMEN AND PELVIS WITH CONTRAST  IMPRESSION: 1. Post recent sigmoid colectomy with left lower quadrant colostomy. Two small residual foci of air within the pelvic mesentery with mild adjacent thickening, but no abscess or drainable collection. Trace non organized free fluid and stranding in the pelvis. 2. Short segment of small bowel wall thickening and inflammation in the pelvis involving the distal ileum, likely reactive. 3. Dilated distal esophagus, stomach, and small bowel, without discrete transition point, favoring postoperative ileus. 4. Small bilateral pleural effusions and compressive atelectasis. 5. Heterogeneous partially enhancing 14 mm lymph node in the retroperitoneum at the aortoiliac bifurcation, not significantly changed from prior exam. Suspected additional lymph nodes in the anterior common iliac space, not significantly changed from prior exam. Recommend attention at follow-up. 6. Additional chronic findings as described.     08/15/2021 Imaging   EXAM: CT CHEST WITH CONTRAST  IMPRESSION: 1. No evidence of thoracic  metastasis. 2. Bilateral small layering pleural effusions with passive atelectasis   08/26/2021 Initial Diagnosis   Cancer of left colon (HCC)   10/10/2021 - 12/12/2021 Chemotherapy   Patient is on Treatment Plan : COLORECTAL Xelox (Capeox) q21d     05/28/2022 - 05/28/2022 Chemotherapy   Patient is on Treatment Plan : COLORECTAL FOLFIRI + Bevacizumab q14d     05/28/2022 -  Chemotherapy   Patient is on Treatment Plan : COLORECTAL FOLFIRI + Panitumumab q14d     07/31/2022 Imaging    IMPRESSION: Decreased bilateral pulmonary metastases.   Stable mild abdominal lymphadenopathy.   No new or progressive metastatic disease within the chest, abdomen, or pelvis.      INTERVAL HISTORY:  Matthew [name] is here for a follow up of metastatic colon cancer. He was last seen by NP Lacie on 11/25/2022. He presents to the clinic accompanied by mother. Pt state that he has no issues with last treatment. Pt deny having neuropathy in hand and feet. No diarrhea.      All other systems were reviewed with the patient and are negative.  MEDICAL HISTORY:  Past Medical History:  Diagnosis Date   Cancer (HCC)    Schizophrenia [facility])     SURGICAL HISTORY: Past Surgical History:  Procedure Laterality Date   BRONCHIAL BIOPSY  05/11/2022   Procedure: BRONCHIAL BIOPSIES;  Surgeon: Leslye Peer, MD;  Location: [facility] ENDOSCOPY;  Service: Pulmonary;;   BRONCHIAL BRUSHINGS  05/11/2022   Procedure: BRONCHIAL BRUSHINGS;  Surgeon: Leslye Peer, MD;  Location: [facility] ENDOSCOPY;  Service: Pulmonary;;   BRONCHIAL NEEDLE ASPIRATION BIOPSY  05/11/2022   Procedure: BRONCHIAL NEEDLE ASPIRATION BIOPSIES;  Surgeon: Leslye Peer, MD;  Location: [facility] ENDOSCOPY;  Service: Pulmonary;;   BRONCHIAL WASHINGS  05/11/2022   Procedure: BRONCHIAL WASHINGS;  Surgeon: Levy Pupa  S, MD;  Location: [facility] ENDOSCOPY;  Service: Pulmonary;;   IR IMAGING GUIDED PORT INSERTION  05/25/2022   LAPAROTOMY N/A 08/08/2021   Procedure:  EXPLORATORY LAPAROTOMY;  Surgeon: Berna Bue, MD;  Location: [facility] OR;  Service: General;  Laterality: N/A;   PARTIAL COLECTOMY N/A 08/08/2021   Procedure: PARTIAL COLECTOMY WITH COLOSTOMY;  Surgeon: Berna Bue, MD;  Location: [facility] OR;  Service: General;  Laterality: N/A;   VIDEO BRONCHOSCOPY WITH RADIAL ENDOBRONCHIAL ULTRASOUND  05/11/2022   Procedure: VIDEO BRONCHOSCOPY WITH RADIAL ENDOBRONCHIAL ULTRASOUND;  Surgeon: Leslye Peer, MD;  Location: [facility] ENDOSCOPY;  Service: Pulmonary;;    I have reviewed the social history and family history with the patient and they are unchanged from previous note.  ALLERGIES:  has No Known Allergies.  MEDICATIONS:  Current Outpatient Medications  Medication Sig Dispense Refill   acetaminophen (TYLENOL) 500 MG tablet Take 2 tablets (1,000 mg total) by mouth every 6 (six) hours as needed for mild pain or moderate pain.  0   aspirin EC 81 MG tablet Take 81 mg by mouth daily as needed (for pain or headaches).      benztropine (COGENTIN) 1 MG tablet Take 1 mg at bedtime by mouth.     clindamycin (CLINDAGEL) 1 % gel Apply topically 2 (two) times daily. To skin rash on face and upper body 30 g 0   ferrous sulfate 325 (65 FE) MG EC tablet Take 1 tablet (325 mg total) by mouth daily. 30 tablet 3   hydrocortisone cream 1 % Apply 1 Application topically 2 (two) times daily as needed for itching. For rash 30 g 2   loperamide (IMODIUM) 2 MG capsule Take 1 capsule (2 mg total) by mouth as needed for diarrhea or loose stools. 30 capsule 0   magnesium oxide (MAG-OX) 400 (240 Mg) MG tablet Take 1 tablet (400 mg total) by mouth daily. 30 tablet 1   Multiple Vitamin (MULTIVITAMIN WITH MINERALS) TABS tablet Take 1 tablet by mouth daily.     ondansetron (ZOFRAN) 8 MG tablet Take 1 tablet (8 mg total) by mouth every 8 (eight) hours as needed for nausea or vomiting. 30 tablet 0   paliperidone (INVEGA SUSTENNA) 156 MG/ML SUSP injection Inject 156 mg every 30 (thirty) days  into the muscle.     polyethylene glycol (MIRALAX / GLYCOLAX) 17 g packet Take 17 g by mouth daily as needed for mild constipation or moderate constipation.  0   potassium chloride SA (KLOR-CON M) 20 MEQ tablet Take 1 tablet (20 mEq total) by mouth 2 (two) times daily. 60 tablet 0   prochlorperazine (COMPAZINE) 10 MG tablet TAKE 1 TABLET(10 MG) BY MOUTH EVERY 6 HOURS AS NEEDED FOR NAUSEA OR VOMITING 30 tablet 0   No current facility-administered medications for this visit.    PHYSICAL EXAMINATION: ECOG PERFORMANCE STATUS: 0 - Asymptomatic  Vitals:   12/09/22 1008  BP: 103/74  Pulse: 78  Resp: 20  Temp: 98.1 F (36.7 C)  SpO2: 100%   Wt Readings from Last 3 Encounters:  12/09/22 152 lb 4.8 oz (69.1 kg)  11/25/22 152 lb 4.8 oz (69.1 kg)  11/12/22 152 lb 3.2 oz (69 kg)   GENERAL:alert, no distress and comfortable SKIN: skin color normal, no rashes or significant lesions EYES: normal, Conjunctiva are pink and non-injected, sclera clear  NEURO: alert & oriented x 3 with fluent speech    LABORATORY DATA:  I have reviewed the data as listed    Latest  Ref Rng & Units 12/09/2022    9:39 AM 11/25/2022   10:55 AM 11/12/2022   11:05 AM  CBC  WBC 4.0 - 10.5 K/uL 4.2  4.4  5.6   Hemoglobin 13.0 - 17.0 g/dL 16.1  09.6  04.5   Hematocrit 39.0 - 52.0 % 36.8  35.8  36.1   Platelets 150 - 400 K/uL 254  294  294         Latest Ref Rng & Units 12/09/2022    9:39 AM 11/25/2022   10:55 AM 11/12/2022   11:05 AM  CMP  Glucose 70 - 99 mg/dL 409  97  811   BUN 6 - 20 mg/dL 12  13  14    Creatinine 0.61 - 1.24 mg/dL 9.14  7.82  9.56   Sodium 135 - 145 mmol/L 140  137  138   Potassium 3.5 - 5.1 mmol/L 3.7  3.3  3.8   Chloride 98 - 111 mmol/L 110  105  108   CO2 22 - 32 mmol/L 24  26  25    Calcium 8.9 - 10.3 mg/dL 8.8  9.3  9.1   Total Protein 6.5 - 8.1 g/dL 6.4  6.8  6.7   Total Bilirubin 0.3 - 1.2 mg/dL 0.7  0.5  0.5   Alkaline Phos 38 - 126 U/L 116  130  129   AST 15 - 41 U/L 19  24  16     ALT 0 - 44 U/L 24  37  18       RADIOGRAPHIC STUDIES: I have personally reviewed the radiological images as listed and agreed with the findings in the report. No results found.    Orders Placed This Encounter  Procedures   CBC with Differential (Cancer Center Only)    Standing Status:   Future    Standing Expiration Date:   01/06/2024   CMP (Cancer Center only)    Standing Status:   Future    Standing Expiration Date:   01/06/2024   Magnesium    Standing Status:   Future    Standing Expiration Date:   01/06/2024   CBC with Differential (Cancer Center Only)    Standing Status:   Future    Standing Expiration Date:   01/20/2024   CMP (Cancer Center only)    Standing Status:   Future    Standing Expiration Date:   01/20/2024   Magnesium    Standing Status:   Future    Standing Expiration Date:   01/20/2024   All questions were answered. The patient knows to call the clinic with any problems, questions or concerns. No barriers to learning was detected. The total time spent in the appointment was 25 minutes.     Malachy Mood, MD 12/09/2022   Carolin Coy, CMA, am acting as scribe for Malachy Mood, MD.   I have reviewed the above documentation for accuracy and completeness, and I agree with the above.

## 2022-12-09 ENCOUNTER — Inpatient Hospital Stay: Payer: Medicaid Other | Attending: Hematology

## 2022-12-09 ENCOUNTER — Encounter: Payer: Self-pay | Admitting: Hematology

## 2022-12-09 ENCOUNTER — Inpatient Hospital Stay: Payer: Medicaid Other

## 2022-12-09 ENCOUNTER — Other Ambulatory Visit: Payer: Self-pay

## 2022-12-09 ENCOUNTER — Inpatient Hospital Stay (HOSPITAL_BASED_OUTPATIENT_CLINIC_OR_DEPARTMENT_OTHER): Payer: Medicaid Other | Admitting: Hematology

## 2022-12-09 VITALS — BP 103/74 | HR 78 | Temp 98.1°F | Resp 20 | Ht 69.0 in | Wt 152.3 lb

## 2022-12-09 DIAGNOSIS — Z5112 Encounter for antineoplastic immunotherapy: Secondary | ICD-10-CM | POA: Insufficient documentation

## 2022-12-09 DIAGNOSIS — F209 Schizophrenia, unspecified: Secondary | ICD-10-CM | POA: Diagnosis not present

## 2022-12-09 DIAGNOSIS — Z5111 Encounter for antineoplastic chemotherapy: Secondary | ICD-10-CM | POA: Diagnosis present

## 2022-12-09 DIAGNOSIS — C7802 Secondary malignant neoplasm of left lung: Secondary | ICD-10-CM | POA: Insufficient documentation

## 2022-12-09 DIAGNOSIS — C189 Malignant neoplasm of colon, unspecified: Secondary | ICD-10-CM

## 2022-12-09 DIAGNOSIS — C186 Malignant neoplasm of descending colon: Secondary | ICD-10-CM

## 2022-12-09 DIAGNOSIS — Z95828 Presence of other vascular implants and grafts: Secondary | ICD-10-CM

## 2022-12-09 DIAGNOSIS — Z79899 Other long term (current) drug therapy: Secondary | ICD-10-CM | POA: Diagnosis not present

## 2022-12-09 DIAGNOSIS — C7801 Secondary malignant neoplasm of right lung: Secondary | ICD-10-CM | POA: Insufficient documentation

## 2022-12-09 LAB — CMP (CANCER CENTER ONLY)
ALT: 24 U/L (ref 0–44)
AST: 19 U/L (ref 15–41)
Albumin: 3.9 g/dL (ref 3.5–5.0)
Alkaline Phosphatase: 116 U/L (ref 38–126)
Anion gap: 6 (ref 5–15)
BUN: 12 mg/dL (ref 6–20)
CO2: 24 mmol/L (ref 22–32)
Calcium: 8.8 mg/dL — ABNORMAL LOW (ref 8.9–10.3)
Chloride: 110 mmol/L (ref 98–111)
Creatinine: 0.71 mg/dL (ref 0.61–1.24)
GFR, Estimated: >60 mL/min (ref >60)
Glucose, Bld: 105 mg/dL — ABNORMAL HIGH (ref 70–99)
Potassium: 3.7 mmol/L (ref 3.5–5.1)
Sodium: 140 mmol/L (ref 135–145)
Total Bilirubin: 0.7 mg/dL (ref 0.3–1.2)
Total Protein: 6.4 g/dL — ABNORMAL LOW (ref 6.5–8.1)

## 2022-12-09 LAB — CBC WITH DIFFERENTIAL (CANCER CENTER ONLY)
Abs Immature Granulocytes: 0.01 10*3/uL (ref 0.00–0.07)
Basophils Absolute: 0 10*3/uL (ref 0.0–0.1)
Basophils Relative: 1 %
Eosinophils Absolute: 0.1 10*3/uL (ref 0.0–0.5)
Eosinophils Relative: 2 %
HCT: 36.8 % — ABNORMAL LOW (ref 39.0–52.0)
Hemoglobin: 12.4 g/dL — ABNORMAL LOW (ref 13.0–17.0)
Immature Granulocytes: 0 %
Lymphocytes Relative: 27 %
Lymphs Abs: 1.1 10*3/uL (ref 0.7–4.0)
MCH: 29.7 pg (ref 26.0–34.0)
MCHC: 33.7 g/dL (ref 30.0–36.0)
MCV: 88 fL (ref 80.0–100.0)
Monocytes Absolute: 0.3 10*3/uL (ref 0.1–1.0)
Monocytes Relative: 7 %
Neutro Abs: 2.6 10*3/uL (ref 1.7–7.7)
Neutrophils Relative %: 63 %
Platelet Count: 254 10*3/uL (ref 150–400)
RBC: 4.18 MIL/uL — ABNORMAL LOW (ref 4.22–5.81)
RDW: 16.1 % — ABNORMAL HIGH (ref 11.5–15.5)
WBC Count: 4.2 10*3/uL (ref 4.0–10.5)
nRBC: 0 % (ref 0.0–0.2)

## 2022-12-09 LAB — MAGNESIUM: Magnesium: 1.9 mg/dL (ref 1.7–2.4)

## 2022-12-09 MED ORDER — SODIUM CHLORIDE 0.9 % IV SOLN: Freq: Once | INTRAVENOUS | Status: AC

## 2022-12-09 MED ORDER — SODIUM CHLORIDE 0.9 % IV SOLN
2400.0000 mg/m2 | INTRAVENOUS | Status: DC
  Administered 2022-12-09: 4350 mg via INTRAVENOUS
  Filled 2022-12-09: qty 87

## 2022-12-09 MED ORDER — SODIUM CHLORIDE 0.9 % IV SOLN
6.0000 mg/kg | Freq: Once | INTRAVENOUS | Status: AC
  Administered 2022-12-09: 400 mg via INTRAVENOUS
  Filled 2022-12-09: qty 20

## 2022-12-09 MED ORDER — SODIUM CHLORIDE 0.9% FLUSH
10.0000 mL | INTRAVENOUS | Status: DC | PRN
  Administered 2022-12-09: 10 mL

## 2022-12-09 MED ORDER — SODIUM CHLORIDE 0.9% FLUSH
10.0000 mL | Freq: Once | INTRAVENOUS | Status: AC
  Administered 2022-12-09: 10 mL

## 2022-12-09 MED ORDER — SODIUM CHLORIDE 0.9 % IV SOLN
10.0000 mg | Freq: Once | INTRAVENOUS | Status: AC
  Administered 2022-12-09: 10 mg via INTRAVENOUS
  Filled 2022-12-09: qty 10

## 2022-12-09 MED ORDER — PALONOSETRON HCL INJECTION 0.25 MG/5ML
0.2500 mg | Freq: Once | INTRAVENOUS | Status: AC
  Administered 2022-12-09: 0.25 mg via INTRAVENOUS
  Filled 2022-12-09: qty 5

## 2022-12-09 MED ORDER — SODIUM CHLORIDE 0.9 % IV SOLN
400.0000 mg/m2 | Freq: Once | INTRAVENOUS | Status: AC
  Administered 2022-12-09: 728 mg via INTRAVENOUS
  Filled 2022-12-09: qty 25

## 2022-12-09 NOTE — Assessment & Plan Note (Signed)
stage IIIB p(T3, N1aM0), MSS, KRAS/NRAS/BRAF wild type, lung and node metastasis in 04/2022  -diagnosed in 08/2021 -he completed 4 cycles adjuvant CAPOX 10/10/21 - 12/25/21 (though he somehow was still taking Xeloda through ~02/09/22 due to misunderstandings).  -he started FOLFIRI on 05/08/2022, Vectibix added from cycle 2, tolerating well overall -restaging CT scan image from August 02, 2022 showed good partial response in pulmonary metastasis, no other new lesions. -Will continue current treatment, will consider switching to maintenance therapy with 5-fu and vectibix after next restaging scan, or sooner if he develops significant neuropathy. -He is tolerating treatment very well, will continue. 

## 2022-12-09 NOTE — Assessment & Plan Note (Signed)
he was diagnosed with schizophrenia in his 20's, managed with medication, he is on disability. -he lives with his mother; he is able to do ADLs for himself, but not iADLS. They are both on disability. His mother also seems to have some kind of mental impairment 

## 2022-12-09 NOTE — Patient Instructions (Signed)
[place] [facility]  Discharge Instructions: Thank you for choosing [facility] to provide your oncology and hematology care.   If you have a lab appointment with the Cancer Center, please go directly to the Cancer Center and check in at the registration area.   Wear comfortable clothing and clothing appropriate for easy access to any Portacath or PICC line.   We strive to give you quality time with your provider. You may need to reschedule your appointment if you arrive late (15 or more minutes).  Arriving late affects you and other patients whose appointments are after yours.  Also, if you miss three or more appointments without notifying the office, you may be dismissed from the clinic at the provider's discretion.      For prescription refill requests, have your pharmacy contact our office and allow 72 hours for refills to be completed.    Today you received the following chemotherapy and/or immunotherapy agents Vectibix, Leucovorin, 5FU pump      To help prevent nausea and vomiting after your treatment, we encourage you to take your nausea medication as directed.  BELOW ARE SYMPTOMS THAT SHOULD BE REPORTED IMMEDIATELY: *FEVER GREATER THAN 100.4 F (38 C) OR HIGHER *CHILLS OR SWEATING *NAUSEA AND VOMITING THAT IS NOT CONTROLLED WITH YOUR NAUSEA MEDICATION *UNUSUAL SHORTNESS OF BREATH *UNUSUAL BRUISING OR BLEEDING *URINARY PROBLEMS (pain or burning when urinating, or frequent urination) *BOWEL PROBLEMS (unusual diarrhea, constipation, pain near the anus) TENDERNESS IN MOUTH AND THROAT WITH OR WITHOUT PRESENCE OF ULCERS (sore throat, sores in mouth, or a toothache) UNUSUAL RASH, SWELLING OR PAIN  UNUSUAL VAGINAL DISCHARGE OR ITCHING   Items with * indicate a potential emergency and should be followed up as soon as possible or go to the Emergency Department if any problems should occur.  Please show the CHEMOTHERAPY ALERT CARD or IMMUNOTHERAPY  ALERT CARD at check-in to the Emergency Department and triage nurse.  Should you have questions after your visit or need to cancel or reschedule your appointment, please contact [place] [facility]  Dept: [phone]  and follow the prompts.  Office hours are 8:00 a.m. to 4:30 p.m. Monday - Friday. Please note that voicemails left after 4:00 p.m. may not be returned until the following business day.  We are closed weekends and major holidays. You have access to a nurse at all times for urgent questions. Please call the main number to the clinic Dept: [phone] and follow the prompts.   For any non-urgent questions, you may also contact your provider using MyChart. We now offer e-Visits for anyone 23 and older to request care online for non-urgent symptoms. For details visit [email or web address].   Also download the MyChart app! Go to the app store, search "MyChart", open the app, select [place], and log in with your MyChart username and password.

## 2022-12-10 ENCOUNTER — Other Ambulatory Visit: Payer: Self-pay

## 2022-12-11 ENCOUNTER — Other Ambulatory Visit: Payer: Self-pay

## 2022-12-11 ENCOUNTER — Inpatient Hospital Stay: Payer: Medicaid Other

## 2022-12-11 VITALS — BP 100/71 | HR 96 | Temp 99.0°F | Resp 18

## 2022-12-11 DIAGNOSIS — C186 Malignant neoplasm of descending colon: Secondary | ICD-10-CM

## 2022-12-11 DIAGNOSIS — Z5111 Encounter for antineoplastic chemotherapy: Secondary | ICD-10-CM | POA: Diagnosis not present

## 2022-12-11 MED ORDER — HEPARIN SOD (PORK) LOCK FLUSH 100 UNIT/ML IV SOLN
500.0000 [IU] | Freq: Once | INTRAVENOUS | Status: AC | PRN
  Administered 2022-12-11: 500 [IU]

## 2022-12-11 MED ORDER — SODIUM CHLORIDE 0.9% FLUSH
10.0000 mL | INTRAVENOUS | Status: DC | PRN
  Administered 2022-12-11: 10 mL

## 2022-12-22 MED FILL — Dexamethasone Sodium Phosphate Inj 100 MG/10ML: INTRAMUSCULAR | Qty: 1 | Status: AC

## 2022-12-22 NOTE — Progress Notes (Unsigned)
Patient Care Team: Medicine, [facility] as PCP - General Matthew Bue, MD as Consulting Physician (General Surgery) Malachy Mood, MD as Consulting Physician (Oncology)   CHIEF COMPLAINT: Follow up metastatic colon cancer   Oncology History Overview Note   Cancer Staging  Cancer of left colon [facility]) Staging form: Colon and Rectum, AJCC 8th Edition - Pathologic stage from 08/08/2021: Stage IIIB (pT3, pN1a, cM0) - Signed by Malachy Mood, MD on 08/26/2021    Cancer of left colon (HCC)  04/01/2020 Imaging   IMPRESSION: 1. Gallbladder decompressed bowel also partially calcified gallstones. No pericholecystic inflammation though if there is persisting clinical concern for cholecystitis right upper quadrant ultrasound could be obtained. 2. Circumferential thickening of the distal thoracic esophagus. Could reflect features of esophagitis. Correlate with clinical symptoms and consider endoscopy as clinically warranted. 3. Additional segmental thickening of the mid to distal sigmoid with focal narrowing. No acute surrounding inflammation or resulting obstruction. Findings are nonspecific, and could reflect sequela of prior inflammation/infection. However, recommend correlation with colonoscopy if not recently performed. 4. Mild circumferential bladder wall thickening and indentation of the bladder base by an enlarged prostate. Possibly sequela of chronic outlet obstruction though could correlate with urinalysis to exclude cystitis. 5. Aortic Atherosclerosis (ICD10-I70.0).   08/06/2021 Imaging   IMPRESSION: Sigmoid colonic perforation with small free intraperitoneal gas and infiltration of the mesenteric and omental fat in keeping with changes of peritonitis.   Long segment inflammatory stranding of the sigmoid colon in keeping with a severe infectious or inflammatory colitis. This terminates an area of irregular mural thickening, infiltrative soft tissue within the colonic  mesentery, and focal dystrophic calcification. This may represent a chronic inflammatory process, however, a perforated malignancy could appear similarly. There are 2 separate points of perforation which again raise the question of an underlying malignancy.   7.1 cm gas and fluid containing pericolonic abscess within the sigmoid mesentery.   Marked inflammatory change of the terminal ileum adjacent to the pericolonic abscess with resultant small bowel obstruction. Fluid within the distal esophagus likely relates to gastroesophageal reflux the setting of vomiting.   Aortic Atherosclerosis (ICD10-I70.0).   08/08/2021 Cancer Staging   Staging form: Colon and Rectum, AJCC 8th Edition - Pathologic stage from 08/08/2021: Stage IIIB (pT3, pN1a, cM0) - Signed by Malachy Mood, MD on 08/26/2021 Stage prefix: Initial diagnosis Total positive nodes: 1 Histologic grading system: 4 grade system Histologic grade (G): G2 Residual tumor (R): R0 - None   08/08/2021 Definitive Surgery   FINAL MICROSCOPIC DIAGNOSIS:   A. COLON, SIGMOID, PARTIAL COLECTOMY:  - Invasive moderately differentiated adenocarcinoma.  - Metastatic carcinoma involving one of twelve lymph nodes (1/12).  - See oncology table below.   ADDENDUM:  Mismatch Repair Protein (IHC)  SUMMARY INTERPRETATION: NORMAL    08/14/2021 Imaging   EXAM: CT ABDOMEN AND PELVIS WITH CONTRAST  IMPRESSION: 1. Post recent sigmoid colectomy with left lower quadrant colostomy. Two small residual foci of air within the pelvic mesentery with mild adjacent thickening, but no abscess or drainable collection. Trace non organized free fluid and stranding in the pelvis. 2. Short segment of small bowel wall thickening and inflammation in the pelvis involving the distal ileum, likely reactive. 3. Dilated distal esophagus, stomach, and small bowel, without discrete transition point, favoring postoperative ileus. 4. Small bilateral pleural effusions and compressive  atelectasis. 5. Heterogeneous partially enhancing 14 mm lymph node in the retroperitoneum at the aortoiliac bifurcation, not significantly changed from prior exam. Suspected additional lymph  nodes in the anterior common iliac space, not significantly changed from prior exam. Recommend attention at follow-up. 6. Additional chronic findings as described.     08/15/2021 Imaging   EXAM: CT CHEST WITH CONTRAST  IMPRESSION: 1. No evidence of thoracic metastasis. 2. Bilateral small layering pleural effusions with passive atelectasis   08/26/2021 Initial Diagnosis   Cancer of left colon (HCC)   10/10/2021 - 12/12/2021 Chemotherapy   Patient is on Treatment Plan : COLORECTAL Xelox (Capeox) q21d     05/28/2022 - 05/28/2022 Chemotherapy   Patient is on Treatment Plan : COLORECTAL FOLFIRI + Bevacizumab q14d     05/28/2022 -  Chemotherapy   Patient is on Treatment Plan : COLORECTAL FOLFIRI + Panitumumab q14d     07/31/2022 Imaging    IMPRESSION: Decreased bilateral pulmonary metastases.   Stable mild abdominal lymphadenopathy.   No new or progressive metastatic disease within the chest, abdomen, or pelvis.      CURRENT THERAPY: FOLFIRI starting 05/08/22 Vectibix (added with C2); q2 weeks; changed to maintenance 5FU/leuc and vectibix 10/28/22   INTERVAL HISTORY Matthew [name] returns for follow up and treatment as scheduled. Last seen by Dr. Mosetta Putt 12/09/22 and completed cycle 4 maintenance 5FU/leuc and vectibix. He is doing well, with good energy and appetite. Denies any side effects such as n/v/c/d, pain, rash. Denies fever, chills, cough, chest pain, dyspnea, or any other specific complaints.   ROS  All other systems reviewed and negative   Past Medical History:  Diagnosis Date   Cancer (HCC)    Schizophrenia [facility])      Past Surgical History:  Procedure Laterality Date   BRONCHIAL BIOPSY  05/11/2022   Procedure: BRONCHIAL BIOPSIES;  Surgeon: Leslye Peer, MD;  Location: [facility]  ENDOSCOPY;  Service: Pulmonary;;   BRONCHIAL BRUSHINGS  05/11/2022   Procedure: BRONCHIAL BRUSHINGS;  Surgeon: Leslye Peer, MD;  Location: [facility] ENDOSCOPY;  Service: Pulmonary;;   BRONCHIAL NEEDLE ASPIRATION BIOPSY  05/11/2022   Procedure: BRONCHIAL NEEDLE ASPIRATION BIOPSIES;  Surgeon: Leslye Peer, MD;  Location: [facility] ENDOSCOPY;  Service: Pulmonary;;   BRONCHIAL WASHINGS  05/11/2022   Procedure: BRONCHIAL WASHINGS;  Surgeon: Leslye Peer, MD;  Location: [facility] ENDOSCOPY;  Service: Pulmonary;;   IR IMAGING GUIDED PORT INSERTION  05/25/2022   LAPAROTOMY N/A 08/08/2021   Procedure: EXPLORATORY LAPAROTOMY;  Surgeon: Matthew Bue, MD;  Location: [facility] OR;  Service: General;  Laterality: N/A;   PARTIAL COLECTOMY N/A 08/08/2021   Procedure: PARTIAL COLECTOMY WITH COLOSTOMY;  Surgeon: Matthew Bue, MD;  Location: [facility] OR;  Service: General;  Laterality: N/A;   VIDEO BRONCHOSCOPY WITH RADIAL ENDOBRONCHIAL ULTRASOUND  05/11/2022   Procedure: VIDEO BRONCHOSCOPY WITH RADIAL ENDOBRONCHIAL ULTRASOUND;  Surgeon: Leslye Peer, MD;  Location: [facility] ENDOSCOPY;  Service: Pulmonary;;     Outpatient Encounter Medications as of 12/23/2022  Medication Sig Note   acetaminophen (TYLENOL) 500 MG tablet Take 2 tablets (1,000 mg total) by mouth every 6 (six) hours as needed for mild pain or moderate pain.    aspirin EC 81 MG tablet Take 81 mg by mouth daily as needed (for pain or headaches).     benztropine (COGENTIN) 1 MG tablet Take 1 mg at bedtime by mouth.    clindamycin (CLINDAGEL) 1 % gel Apply topically 2 (two) times daily. To skin rash on face and upper body    ferrous sulfate 325 (65 FE) MG EC tablet Take 1 tablet (325 mg total) by mouth daily.    hydrocortisone cream  1 % Apply 1 Application topically 2 (two) times daily as needed for itching. For rash    loperamide (IMODIUM) 2 MG capsule Take 1 capsule (2 mg total) by mouth as needed for diarrhea or loose stools.    magnesium oxide (MAG-OX) 400 (240 Mg) MG  tablet Take 1 tablet (400 mg total) by mouth daily.    Multiple Vitamin (MULTIVITAMIN WITH MINERALS) TABS tablet Take 1 tablet by mouth daily.    ondansetron (ZOFRAN) 8 MG tablet Take 1 tablet (8 mg total) by mouth every 8 (eight) hours as needed for nausea or vomiting.    paliperidone (INVEGA SUSTENNA) 156 MG/ML SUSP injection Inject 156 mg every 30 (thirty) days into the muscle. 05/25/2022: Due tomorrow   polyethylene glycol (MIRALAX / GLYCOLAX) 17 g packet Take 17 g by mouth daily as needed for mild constipation or moderate constipation.    potassium chloride SA (KLOR-CON M) 20 MEQ tablet Take 1 tablet (20 mEq total) by mouth 2 (two) times daily.    prochlorperazine (COMPAZINE) 10 MG tablet TAKE 1 TABLET(10 MG) BY MOUTH EVERY 6 HOURS AS NEEDED FOR NAUSEA OR VOMITING    [EXPIRED] sodium chloride flush (NS) 0.9 % injection 10 mL     No facility-administered encounter medications on file as of 12/23/2022.     Today's Vitals   12/23/22 0931 12/23/22 0937  BP: 106/76   Pulse: 100   Resp: 18   Temp: 98.2 F (36.8 C)   TempSrc: Oral   SpO2: 97%   Weight: 151 lb 12.8 oz (68.9 kg)   PainSc:  0-No pain   Body mass index is 22.42 kg/m.   PHYSICAL EXAM GENERAL:alert, no distress and comfortable SKIN: no rash  EYES: sclera clear LUNGS: normal breathing effort NEURO: alert & oriented x 3 with fluent speech PAC without erythema    CBC    Component Value Date/Time   WBC 5.5 12/23/2022 0915   WBC 5.2 05/11/2022 0801   RBC 4.29 12/23/2022 0915   HGB 12.9 (L) 12/23/2022 0915   HCT 38.7 (L) 12/23/2022 0915   PLT 315 12/23/2022 0915   MCV 90.2 12/23/2022 0915   MCH 30.1 12/23/2022 0915   MCHC 33.3 12/23/2022 0915   RDW 16.4 (H) 12/23/2022 0915   LYMPHSABS 1.0 12/23/2022 0915   MONOABS 0.4 12/23/2022 0915   EOSABS 0.2 12/23/2022 0915   BASOSABS 0.0 12/23/2022 0915     CMP     Component Value Date/Time   NA 144 12/23/2022 0915   K 3.5 12/23/2022 0915   CL 110 12/23/2022 0915    CO2 26 12/23/2022 0915   GLUCOSE 127 (H) 12/23/2022 0915   BUN 12 12/23/2022 0915   CREATININE 0.83 12/23/2022 0915   CALCIUM 9.0 12/23/2022 0915   PROT 6.5 12/23/2022 0915   ALBUMIN 3.9 12/23/2022 0915   AST 14 (L) 12/23/2022 0915   ALT 13 12/23/2022 0915   ALKPHOS 104 12/23/2022 0915   BILITOT 0.5 12/23/2022 0915   GFRNONAA >60 12/23/2022 0915   GFRAA >60 04/01/2020 1237     ASSESSMENT & PLAN:Matthew [name] is a 57 y.o. male with    1. Cancer of left colon, stage IIIB p(T3, N1aM0), MSS -presented to ED on 08/06/21 with sigmoid colon perforation, s/p emergent partial colectomy on 08/08/21 by Dr. Doylene Canard showed invasive moderately differentiated adenocarcinoma. Margins negative, one lymph node showed metastatic carcinoma (1/12). -Staging chest CT 08/15/21 was negative for metastatic disease. -he completed 4 cycles adjuvant CAPOX on 10/10/21 - May  or July 2023 -Surveillance scan 01/2022 showed pulmonary nodules and retroperitoneal adenopathy, concerning for recurrent/metastatic disease -Lung biopsy confirmed malignant cells consistent with colonic primary -He began first-line FOLFIRI in 05/2022, Vectibix added with cycle 2.  Tolerated well with good response on CT from 07/31/2022 and again with continued response in lungs and lymph nodes on CT CAP from 10/26/2022.   -Changed to maintenance 5FU/leuc and vectibix on 10/28/22, s/p 4 cycles -Matthew [name] appears stable. Tolerating treatment without significant side effects.  Able to maintain adequate performance status.  There is no clinical evidence of disease progression -Labs reviewed, adequate to proceed with cycle 5 maintenance 5-FU/leuc and Vectibix today as scheduled, same dose -Pt's mother had many questions about his stage, treatment plan/duration, possibility of radiation, and colostomy reversal. I answered to best of my ability and their satisfaction. Will discuss after next scan -Continue 5FU/leuc and Vectibix q2 weeks, f/up with  treatment -Restage before f/up in 4 weeks    2. Anemia, likely iron deficient -chronic prior to diagnosis, worsened with surgery -he is currently on oral iron. -overall mild and stable    3. Schizophrenia, disabled, social support -he was diagnosed with schizophrenia in his 20's, managed with medication, he is on disability. -he lives with his mother; he is able to do ADLs for himself, but not iADLS. They are both on disability.       PLAN: -Labs reviewed  -Proceed with cycle 5 maintenance 5-FU/leuk and Vectibix today as planned, same dose; continue every 2 weeks -Follow-up in 2 and 4 weeks -Restage before follow-up in 4 weeks  Orders Placed This Encounter  Procedures   CT CHEST ABDOMEN PELVIS W CONTRAST    Standing Status:   Future    Standing Expiration Date:   12/23/2023    Order Specific Question:   If indicated for the ordered procedure, I authorize the administration of contrast media per Radiology protocol    Answer:   Yes    Order Specific Question:   Does the patient have a contrast media/X-ray dye allergy?    Answer:   No    Order Specific Question:   Preferred imaging location?    Answer:   [facility]    Order Specific Question:   If indicated for the ordered procedure, I authorize the administration of oral contrast media per Radiology protocol    Answer:   Yes      All questions were answered. The patient knows to call the clinic with any problems, questions or concerns. No barriers to learning were detected. I spent 20 minutes counseling the patient face to face. The total time spent in the appointment was 30 minutes and more than 50% was on counseling, review of test results, and coordination of care.   Santiago Glad, NP-C 12/23/2022

## 2022-12-23 ENCOUNTER — Inpatient Hospital Stay: Payer: Medicaid Other

## 2022-12-23 ENCOUNTER — Inpatient Hospital Stay (HOSPITAL_BASED_OUTPATIENT_CLINIC_OR_DEPARTMENT_OTHER): Payer: Medicaid Other | Admitting: Nurse Practitioner

## 2022-12-23 ENCOUNTER — Other Ambulatory Visit: Payer: Self-pay

## 2022-12-23 ENCOUNTER — Encounter: Payer: Self-pay | Admitting: Nurse Practitioner

## 2022-12-23 VITALS — BP 106/76 | HR 100 | Temp 98.2°F | Resp 18 | Wt 151.8 lb

## 2022-12-23 DIAGNOSIS — C186 Malignant neoplasm of descending colon: Secondary | ICD-10-CM

## 2022-12-23 DIAGNOSIS — Z5111 Encounter for antineoplastic chemotherapy: Secondary | ICD-10-CM | POA: Diagnosis not present

## 2022-12-23 DIAGNOSIS — C7800 Secondary malignant neoplasm of unspecified lung: Secondary | ICD-10-CM

## 2022-12-23 DIAGNOSIS — C189 Malignant neoplasm of colon, unspecified: Secondary | ICD-10-CM

## 2022-12-23 DIAGNOSIS — Z95828 Presence of other vascular implants and grafts: Secondary | ICD-10-CM

## 2022-12-23 LAB — CMP (CANCER CENTER ONLY)
ALT: 13 U/L (ref 0–44)
AST: 14 U/L — ABNORMAL LOW (ref 15–41)
Albumin: 3.9 g/dL (ref 3.5–5.0)
Alkaline Phosphatase: 104 U/L (ref 38–126)
Anion gap: 8 (ref 5–15)
BUN: 12 mg/dL (ref 6–20)
CO2: 26 mmol/L (ref 22–32)
Calcium: 9 mg/dL (ref 8.9–10.3)
Chloride: 110 mmol/L (ref 98–111)
Creatinine: 0.83 mg/dL (ref 0.61–1.24)
GFR, Estimated: >60 mL/min (ref >60)
Glucose, Bld: 127 mg/dL — ABNORMAL HIGH (ref 70–99)
Potassium: 3.5 mmol/L (ref 3.5–5.1)
Sodium: 144 mmol/L (ref 135–145)
Total Bilirubin: 0.5 mg/dL (ref 0.3–1.2)
Total Protein: 6.5 g/dL (ref 6.5–8.1)

## 2022-12-23 LAB — MAGNESIUM: Magnesium: 1.8 mg/dL (ref 1.7–2.4)

## 2022-12-23 LAB — CBC WITH DIFFERENTIAL (CANCER CENTER ONLY)
Abs Immature Granulocytes: 0.01 10*3/uL (ref 0.00–0.07)
Basophils Absolute: 0 10*3/uL (ref 0.0–0.1)
Basophils Relative: 1 %
Eosinophils Absolute: 0.2 10*3/uL (ref 0.0–0.5)
Eosinophils Relative: 3 %
HCT: 38.7 % — ABNORMAL LOW (ref 39.0–52.0)
Hemoglobin: 12.9 g/dL — ABNORMAL LOW (ref 13.0–17.0)
Immature Granulocytes: 0 %
Lymphocytes Relative: 19 %
Lymphs Abs: 1 10*3/uL (ref 0.7–4.0)
MCH: 30.1 pg (ref 26.0–34.0)
MCHC: 33.3 g/dL (ref 30.0–36.0)
MCV: 90.2 fL (ref 80.0–100.0)
Monocytes Absolute: 0.4 10*3/uL (ref 0.1–1.0)
Monocytes Relative: 7 %
Neutro Abs: 3.9 10*3/uL (ref 1.7–7.7)
Neutrophils Relative %: 70 %
Platelet Count: 315 10*3/uL (ref 150–400)
RBC: 4.29 MIL/uL (ref 4.22–5.81)
RDW: 16.4 % — ABNORMAL HIGH (ref 11.5–15.5)
WBC Count: 5.5 10*3/uL (ref 4.0–10.5)
nRBC: 0 % (ref 0.0–0.2)

## 2022-12-23 MED ORDER — SODIUM CHLORIDE 0.9 % IV SOLN
10.0000 mg | Freq: Once | INTRAVENOUS | Status: AC
  Administered 2022-12-23: 10 mg via INTRAVENOUS
  Filled 2022-12-23: qty 10

## 2022-12-23 MED ORDER — SODIUM CHLORIDE 0.9 % IV SOLN
400.0000 mg/m2 | Freq: Once | INTRAVENOUS | Status: AC
  Administered 2022-12-23: 728 mg via INTRAVENOUS
  Filled 2022-12-23: qty 25

## 2022-12-23 MED ORDER — SODIUM CHLORIDE 0.9 % IV SOLN: Freq: Once | INTRAVENOUS | Status: AC

## 2022-12-23 MED ORDER — SODIUM CHLORIDE 0.9 % IV SOLN
6.0000 mg/kg | Freq: Once | INTRAVENOUS | Status: AC
  Administered 2022-12-23: 400 mg via INTRAVENOUS
  Filled 2022-12-23: qty 20

## 2022-12-23 MED ORDER — SODIUM CHLORIDE 0.9% FLUSH
10.0000 mL | Freq: Once | INTRAVENOUS | Status: AC
  Administered 2022-12-23: 10 mL

## 2022-12-23 MED ORDER — SODIUM CHLORIDE 0.9 % IV SOLN
2400.0000 mg/m2 | INTRAVENOUS | Status: DC
  Administered 2022-12-23: 4350 mg via INTRAVENOUS
  Filled 2022-12-23: qty 87

## 2022-12-23 MED ORDER — PALONOSETRON HCL INJECTION 0.25 MG/5ML
0.2500 mg | Freq: Once | INTRAVENOUS | Status: AC
  Administered 2022-12-23: 0.25 mg via INTRAVENOUS
  Filled 2022-12-23: qty 5

## 2022-12-23 NOTE — Patient Instructions (Signed)
[facility]  Discharge Instructions: Thank you for choosing [facility] to provide your oncology and hematology care.   If you have a lab appointment with the Cancer Center, please go directly to the Cancer Center and check in at the registration area.   Wear comfortable clothing and clothing appropriate for easy access to any Portacath or PICC line.   We strive to give you quality time with your provider. You may need to reschedule your appointment if you arrive late (15 or more minutes).  Arriving late affects you and other patients whose appointments are after yours.  Also, if you miss three or more appointments without notifying the office, you may be dismissed from the clinic at the provider's discretion.      For prescription refill requests, have your pharmacy contact our office and allow 72 hours for refills to be completed.    Today you received the following chemotherapy and/or immunotherapy agents: Vectibix, Irinotecan, Leucovorin, Fluorouracil.       To help prevent nausea and vomiting after your treatment, we encourage you to take your nausea medication as directed.  BELOW ARE SYMPTOMS THAT SHOULD BE REPORTED IMMEDIATELY: *FEVER GREATER THAN 100.4 F (38 C) OR HIGHER *CHILLS OR SWEATING *NAUSEA AND VOMITING THAT IS NOT CONTROLLED WITH YOUR NAUSEA MEDICATION *UNUSUAL SHORTNESS OF BREATH *UNUSUAL BRUISING OR BLEEDING *URINARY PROBLEMS (pain or burning when urinating, or frequent urination) *BOWEL PROBLEMS (unusual diarrhea, constipation, pain near the anus) TENDERNESS IN MOUTH AND THROAT WITH OR WITHOUT PRESENCE OF ULCERS (sore throat, sores in mouth, or a toothache) UNUSUAL RASH, SWELLING OR PAIN  UNUSUAL VAGINAL DISCHARGE OR ITCHING   Items with * indicate a potential emergency and should be followed up as soon as possible or go to the Emergency Department if any problems should occur.  Please show the CHEMOTHERAPY ALERT  CARD or IMMUNOTHERAPY ALERT CARD at check-in to the Emergency Department and triage nurse.  Should you have questions after your visit or need to cancel or reschedule your appointment, please contact [facility]  Dept: [phone]  and follow the prompts.  Office hours are 8:00 a.m. to 4:30 p.m. Monday - Friday. Please note that voicemails left after 4:00 p.m. may not be returned until the following business day.  We are closed weekends and major holidays. You have access to a nurse at all times for urgent questions. Please call the main number to the clinic Dept: [phone] and follow the prompts.   For any non-urgent questions, you may also contact your provider using MyChart. We now offer e-Visits for anyone 18 and older to request care online for non-urgent symptoms. For details visit [email or web address].   Also download the MyChart app! Go to the app store, search "MyChart", open the app, select [place], and log in with your MyChart username and password.   

## 2022-12-25 ENCOUNTER — Inpatient Hospital Stay: Payer: Medicaid Other

## 2022-12-25 VITALS — BP 95/70 | HR 93 | Temp 98.2°F | Resp 18

## 2022-12-25 DIAGNOSIS — C186 Malignant neoplasm of descending colon: Secondary | ICD-10-CM

## 2022-12-25 DIAGNOSIS — Z5111 Encounter for antineoplastic chemotherapy: Secondary | ICD-10-CM | POA: Diagnosis not present

## 2022-12-25 MED ORDER — HEPARIN SOD (PORK) LOCK FLUSH 100 UNIT/ML IV SOLN
500.0000 [IU] | Freq: Once | INTRAVENOUS | Status: AC | PRN
  Administered 2022-12-25: 500 [IU]

## 2022-12-25 MED ORDER — SODIUM CHLORIDE 0.9% FLUSH
10.0000 mL | INTRAVENOUS | Status: DC | PRN
  Administered 2022-12-25: 10 mL

## 2023-01-05 MED FILL — Dexamethasone Sodium Phosphate Inj 100 MG/10ML: INTRAMUSCULAR | Qty: 1 | Status: AC

## 2023-01-05 NOTE — Assessment & Plan Note (Signed)
he was diagnosed with schizophrenia in his 20's, managed with medication, he is on disability. -he lives with his mother; he is able to do ADLs for himself, but not iADLS. They are both on disability. His mother also seems to have some kind of mental impairment 

## 2023-01-05 NOTE — Assessment & Plan Note (Addendum)
stage IIIB p(T3, N1aM0), MSS, KRAS/NRAS/BRAF wild type, lung and node metastasis in 04/2022  -diagnosed in 08/2021 -he completed 4 cycles adjuvant CAPOX 10/10/21 - 12/25/21 (though he somehow was still taking Xeloda through ~02/09/22 due to misunderstandings).  -he started FOLFIRI on 05/08/2022, Vectibix added from cycle 2, tolerating well overall -restaging CT scan image from August 02, 2022 showed good partial response in pulmonary metastasis, no other new lesions. -Will continue current treatment, will consider switching to maintenance therapy with 5-fu and vectibix after next restaging scan, or sooner if he develops significant neuropathy. -He is tolerating treatment very well, will continue. -We discussed option of changing to maintenance therapy with 5-fu and Vectibix, if his next scan on 6/17 shows excellent response.

## 2023-01-06 ENCOUNTER — Inpatient Hospital Stay: Payer: Medicaid Other

## 2023-01-06 ENCOUNTER — Encounter: Payer: Self-pay | Admitting: Hematology

## 2023-01-06 ENCOUNTER — Inpatient Hospital Stay (HOSPITAL_BASED_OUTPATIENT_CLINIC_OR_DEPARTMENT_OTHER): Payer: Medicaid Other | Admitting: Hematology

## 2023-01-06 ENCOUNTER — Other Ambulatory Visit: Payer: Self-pay

## 2023-01-06 ENCOUNTER — Inpatient Hospital Stay: Payer: Medicaid Other | Attending: Hematology

## 2023-01-06 VITALS — BP 99/57 | HR 87 | Temp 98.4°F | Resp 18 | Ht 69.0 in | Wt 151.2 lb

## 2023-01-06 DIAGNOSIS — Z5112 Encounter for antineoplastic immunotherapy: Secondary | ICD-10-CM | POA: Diagnosis present

## 2023-01-06 DIAGNOSIS — C7801 Secondary malignant neoplasm of right lung: Secondary | ICD-10-CM | POA: Diagnosis not present

## 2023-01-06 DIAGNOSIS — C7802 Secondary malignant neoplasm of left lung: Secondary | ICD-10-CM | POA: Diagnosis not present

## 2023-01-06 DIAGNOSIS — Z95828 Presence of other vascular implants and grafts: Secondary | ICD-10-CM

## 2023-01-06 DIAGNOSIS — C186 Malignant neoplasm of descending colon: Secondary | ICD-10-CM

## 2023-01-06 DIAGNOSIS — Z79899 Other long term (current) drug therapy: Secondary | ICD-10-CM | POA: Insufficient documentation

## 2023-01-06 DIAGNOSIS — C189 Malignant neoplasm of colon, unspecified: Secondary | ICD-10-CM

## 2023-01-06 DIAGNOSIS — F209 Schizophrenia, unspecified: Secondary | ICD-10-CM | POA: Diagnosis not present

## 2023-01-06 DIAGNOSIS — Z5111 Encounter for antineoplastic chemotherapy: Secondary | ICD-10-CM | POA: Insufficient documentation

## 2023-01-06 LAB — CMP (CANCER CENTER ONLY)
ALT: 37 U/L (ref 0–44)
AST: 24 U/L (ref 15–41)
Albumin: 4 g/dL (ref 3.5–5.0)
Alkaline Phosphatase: 132 U/L — ABNORMAL HIGH (ref 38–126)
Anion gap: 7 (ref 5–15)
BUN: 11 mg/dL (ref 6–20)
CO2: 25 mmol/L (ref 22–32)
Calcium: 9.1 mg/dL (ref 8.9–10.3)
Chloride: 107 mmol/L (ref 98–111)
Creatinine: 0.75 mg/dL (ref 0.61–1.24)
GFR, Estimated: >60 mL/min (ref >60)
Glucose, Bld: 100 mg/dL — ABNORMAL HIGH (ref 70–99)
Potassium: 3.4 mmol/L — ABNORMAL LOW (ref 3.5–5.1)
Sodium: 139 mmol/L (ref 135–145)
Total Bilirubin: 0.8 mg/dL (ref 0.3–1.2)
Total Protein: 6.8 g/dL (ref 6.5–8.1)

## 2023-01-06 LAB — CBC WITH DIFFERENTIAL (CANCER CENTER ONLY)
Abs Immature Granulocytes: 0.01 10*3/uL (ref 0.00–0.07)
Basophils Absolute: 0 10*3/uL (ref 0.0–0.1)
Basophils Relative: 1 %
Eosinophils Absolute: 0.1 10*3/uL (ref 0.0–0.5)
Eosinophils Relative: 2 %
HCT: 36.3 % — ABNORMAL LOW (ref 39.0–52.0)
Hemoglobin: 12.7 g/dL — ABNORMAL LOW (ref 13.0–17.0)
Immature Granulocytes: 0 %
Lymphocytes Relative: 25 %
Lymphs Abs: 1 10*3/uL (ref 0.7–4.0)
MCH: 30.2 pg (ref 26.0–34.0)
MCHC: 35 g/dL (ref 30.0–36.0)
MCV: 86.4 fL (ref 80.0–100.0)
Monocytes Absolute: 0.4 10*3/uL (ref 0.1–1.0)
Monocytes Relative: 10 %
Neutro Abs: 2.5 10*3/uL (ref 1.7–7.7)
Neutrophils Relative %: 62 %
Platelet Count: 292 10*3/uL (ref 150–400)
RBC: 4.2 MIL/uL — ABNORMAL LOW (ref 4.22–5.81)
RDW: 16.2 % — ABNORMAL HIGH (ref 11.5–15.5)
WBC Count: 4.1 10*3/uL (ref 4.0–10.5)
nRBC: 0 % (ref 0.0–0.2)

## 2023-01-06 LAB — MAGNESIUM: Magnesium: 1.9 mg/dL (ref 1.7–2.4)

## 2023-01-06 MED ORDER — SODIUM CHLORIDE 0.9 % IV SOLN
2400.0000 mg/m2 | INTRAVENOUS | Status: DC
  Administered 2023-01-06: 4350 mg via INTRAVENOUS
  Filled 2023-01-06: qty 87

## 2023-01-06 MED ORDER — SODIUM CHLORIDE 0.9 % IV SOLN
400.0000 mg/m2 | Freq: Once | INTRAVENOUS | Status: AC
  Administered 2023-01-06: 728 mg via INTRAVENOUS
  Filled 2023-01-06: qty 17.5

## 2023-01-06 MED ORDER — SODIUM CHLORIDE 0.9 % IV SOLN
6.0000 mg/kg | Freq: Once | INTRAVENOUS | Status: AC
  Administered 2023-01-06: 400 mg via INTRAVENOUS
  Filled 2023-01-06: qty 20

## 2023-01-06 MED ORDER — SODIUM CHLORIDE 0.9 % IV SOLN: Freq: Once | INTRAVENOUS | Status: AC

## 2023-01-06 MED ORDER — SODIUM CHLORIDE 0.9% FLUSH
10.0000 mL | Freq: Once | INTRAVENOUS | Status: AC
  Administered 2023-01-06: 10 mL

## 2023-01-06 MED ORDER — SODIUM CHLORIDE 0.9 % IV SOLN
10.0000 mg | Freq: Once | INTRAVENOUS | Status: AC
  Administered 2023-01-06: 10 mg via INTRAVENOUS
  Filled 2023-01-06: qty 10

## 2023-01-06 MED ORDER — SODIUM CHLORIDE 0.9% FLUSH: 10.0000 mL | INTRAVENOUS | Status: DC | PRN

## 2023-01-06 MED ORDER — PALONOSETRON HCL INJECTION 0.25 MG/5ML
0.2500 mg | Freq: Once | INTRAVENOUS | Status: AC
  Administered 2023-01-06: 0.25 mg via INTRAVENOUS
  Filled 2023-01-06: qty 5

## 2023-01-06 NOTE — Patient Instructions (Signed)
[facility]  Discharge Instructions: Thank you for choosing [facility] to provide your oncology and hematology care.   If you have a lab appointment with the Cancer Center, please go directly to the Cancer Center and check in at the registration area.   Wear comfortable clothing and clothing appropriate for easy access to any Portacath or PICC line.   We strive to give you quality time with your provider. You may need to reschedule your appointment if you arrive late (15 or more minutes).  Arriving late affects you and other patients whose appointments are after yours.  Also, if you miss three or more appointments without notifying the office, you may be dismissed from the clinic at the provider's discretion.      For prescription refill requests, have your pharmacy contact our office and allow 72 hours for refills to be completed.    Today you received the following chemotherapy and/or immunotherapy agents :  Vectibix, Leucovorin, Fluorourocil.      To help prevent nausea and vomiting after your treatment, we encourage you to take your nausea medication as directed.  BELOW ARE SYMPTOMS THAT SHOULD BE REPORTED IMMEDIATELY: *FEVER GREATER THAN 100.4 F (38 C) OR HIGHER *CHILLS OR SWEATING *NAUSEA AND VOMITING THAT IS NOT CONTROLLED WITH YOUR NAUSEA MEDICATION *UNUSUAL SHORTNESS OF BREATH *UNUSUAL BRUISING OR BLEEDING *URINARY PROBLEMS (pain or burning when urinating, or frequent urination) *BOWEL PROBLEMS (unusual diarrhea, constipation, pain near the anus) TENDERNESS IN MOUTH AND THROAT WITH OR WITHOUT PRESENCE OF ULCERS (sore throat, sores in mouth, or a toothache) UNUSUAL RASH, SWELLING OR PAIN  UNUSUAL VAGINAL DISCHARGE OR ITCHING   Items with * indicate a potential emergency and should be followed up as soon as possible or go to the Emergency Department if any problems should occur.  Please show the CHEMOTHERAPY ALERT CARD or  IMMUNOTHERAPY ALERT CARD at check-in to the Emergency Department and triage nurse.  Should you have questions after your visit or need to cancel or reschedule your appointment, please contact  [facility]  Dept: [phone]  and follow the prompts.  Office hours are 8:00 a.m. to 4:30 p.m. Monday - Friday. Please note that voicemails left after 4:00 p.m. may not be returned until the following business day.  We are closed weekends and major holidays. You have access to a nurse at all times for urgent questions. Please call the main number to the clinic Dept: [phone] and follow the prompts.   For any non-urgent questions, you may also contact your provider using MyChart. We now offer e-Visits for anyone 69 and older to request care online for non-urgent symptoms. For details visit [email or web address].   Also download the MyChart app! Go to the app store, search "MyChart", open the app, select , and log in with your MyChart username and password.

## 2023-01-06 NOTE — Progress Notes (Addendum)
[facility]   [phone]) [phone] [phone]   Clinic Follow up Note   Patient Care Team: Medicine, [facility] as PCP - General Berna Bue, MD as Consulting Physician (General Surgery) Malachy Mood, MD as Consulting Physician (Oncology)  Date of Service:  01/06/2023  CHIEF COMPLAINT: f/u of metastatic colon cancer   CURRENT THERAPY:  FOLFIRI starting 05/08/22 Vectibix (added with C2); q2 weeks; changed to maintenance 5FU/leuc and vectibix 10/28/22   ASSESSMENT:  Matthew [name] is a 57 y.o. 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  -he started FOLFIRI on 05/08/2022, Vectibix added from cycle 2, tolerating well overall -restaging CT scan image from August 02, 2022 and 10/28/2022 both showed good partial response in pulmonary metastasis, no other new lesions. -we have changed his treatment to maintenance therapy with 5-fu and vectibix in late March 2024 -He is tolerating treatment very well, will continue.  Schizophrenia [facility]) he was diagnosed with schizophrenia in his 20's, managed with medication, he is on disability. -he lives with his mother; he is able to do ADLs for himself, but not iADLS. They are both on disability. His mother also seems to have some kind of mental impairment       PLAN: -lab reviewed - I CT scan show good response. I will change to maintenance therapy. -proceed with treatment today -CT CAP is schedule for 6/17 -lab/flush in and f/u in 2 weeks SUMMARY OF ONCOLOGIC HISTORY: Oncology History Overview Note   Cancer Staging  Cancer of left colon [facility]) Staging form: Colon and Rectum, AJCC 8th Edition - Pathologic stage from 08/08/2021: Stage IIIB (pT3, pN1a, cM0)  - Signed by Malachy Mood, MD on 08/26/2021    Cancer of left colon (HCC)  04/01/2020 Imaging   IMPRESSION: 1. Gallbladder decompressed bowel also partially calcified gallstones. No pericholecystic inflammation though if there is persisting clinical concern for cholecystitis right upper quadrant ultrasound could be obtained. 2. Circumferential thickening of the distal thoracic esophagus. Could reflect features of esophagitis. Correlate with clinical symptoms and consider endoscopy as clinically warranted. 3. Additional segmental thickening of the mid to distal sigmoid with focal narrowing. No acute surrounding inflammation or resulting obstruction. Findings are nonspecific, and could reflect sequela of prior inflammation/infection. However, recommend correlation with colonoscopy if not recently performed. 4. Mild circumferential bladder wall thickening and indentation of the bladder base by an enlarged prostate. Possibly sequela of chronic outlet obstruction though could correlate with urinalysis to exclude cystitis. 5. Aortic Atherosclerosis (ICD10-I70.0).   08/06/2021 Imaging   IMPRESSION: Sigmoid colonic perforation with small free intraperitoneal gas and infiltration of the mesenteric and omental fat in keeping with changes of peritonitis.   Long segment inflammatory stranding of the sigmoid colon in keeping with a severe infectious or inflammatory colitis. This terminates an area of irregular mural thickening, infiltrative soft tissue within the colonic mesentery, and focal dystrophic calcification. This may represent a chronic inflammatory process, however, a perforated malignancy could appear similarly. There are 2 separate points of perforation which again raise the question of an underlying malignancy.   7.1 cm gas and fluid containing pericolonic abscess within the sigmoid mesentery.   Marked inflammatory change of the terminal ileum adjacent to the pericolonic abscess with  resultant small bowel  obstruction. Fluid within the distal esophagus likely relates to gastroesophageal reflux the setting of vomiting.   Aortic Atherosclerosis (ICD10-I70.0).   08/08/2021 Cancer Staging   Staging form: Colon and Rectum, AJCC 8th Edition - Pathologic stage from 08/08/2021: Stage IIIB (pT3, pN1a, cM0) - Signed by Malachy Mood, MD on 08/26/2021 Stage prefix: Initial diagnosis Total positive nodes: 1 Histologic grading system: 4 grade system Histologic grade (G): G2 Residual tumor (R): R0 - None   08/08/2021 Definitive Surgery   FINAL MICROSCOPIC DIAGNOSIS:   A. COLON, SIGMOID, PARTIAL COLECTOMY:  - Invasive moderately differentiated adenocarcinoma.  - Metastatic carcinoma involving one of twelve lymph nodes (1/12).  - See oncology table below.   ADDENDUM:  Mismatch Repair Protein (IHC)  SUMMARY INTERPRETATION: NORMAL    08/14/2021 Imaging   EXAM: CT ABDOMEN AND PELVIS WITH CONTRAST  IMPRESSION: 1. Post recent sigmoid colectomy with left lower quadrant colostomy. Two small residual foci of air within the pelvic mesentery with mild adjacent thickening, but no abscess or drainable collection. Trace non organized free fluid and stranding in the pelvis. 2. Short segment of small bowel wall thickening and inflammation in the pelvis involving the distal ileum, likely reactive. 3. Dilated distal esophagus, stomach, and small bowel, without discrete transition point, favoring postoperative ileus. 4. Small bilateral pleural effusions and compressive atelectasis. 5. Heterogeneous partially enhancing 14 mm lymph node in the retroperitoneum at the aortoiliac bifurcation, not significantly changed from prior exam. Suspected additional lymph nodes in the anterior common iliac space, not significantly changed from prior exam. Recommend attention at follow-up. 6. Additional chronic findings as described.     08/15/2021 Imaging   EXAM: CT CHEST WITH CONTRAST  IMPRESSION: 1. No  evidence of thoracic metastasis. 2. Bilateral small layering pleural effusions with passive atelectasis   08/26/2021 Initial Diagnosis   Cancer of left colon (HCC)   10/10/2021 - 12/12/2021 Chemotherapy   Patient is on Treatment Plan : COLORECTAL Xelox (Capeox) q21d     05/28/2022 - 05/28/2022 Chemotherapy   Patient is on Treatment Plan : COLORECTAL FOLFIRI + Bevacizumab q14d     05/28/2022 -  Chemotherapy   Patient is on Treatment Plan : COLORECTAL FOLFIRI + Panitumumab q14d     07/31/2022 Imaging    IMPRESSION: Decreased bilateral pulmonary metastases.   Stable mild abdominal lymphadenopathy.   No new or progressive metastatic disease within the chest, abdomen, or pelvis.      INTERVAL HISTORY:  Matthew [name] is here for a follow up of metastatic colon cancer. He was last seen by NP Lacie on 12/23/2022. He presents to the clinic accompanied by mother. Pt state that the last  cycle of chemo went well. Pt state that his BM is regular and no diarrhea.  Pt is clinically doing well. He denies  having nausea and vomiting. Pt state that he has a little rash on his hands.     All other systems were reviewed with the patient and are negative.  MEDICAL HISTORY:  Past Medical History:  Diagnosis Date   Cancer (HCC)    Schizophrenia [facility])     SURGICAL HISTORY: Past Surgical History:  Procedure Laterality Date   BRONCHIAL BIOPSY  05/11/2022   Procedure: BRONCHIAL BIOPSIES;  Surgeon: Leslye Peer, MD;  Location: [facility] ENDOSCOPY;  Service: Pulmonary;;   BRONCHIAL BRUSHINGS  05/11/2022   Procedure: BRONCHIAL BRUSHINGS;  Surgeon: Leslye Peer, MD;  Location: [facility] ENDOSCOPY;  Service: Pulmonary;;   BRONCHIAL NEEDLE ASPIRATION BIOPSY  05/11/2022   Procedure: BRONCHIAL  NEEDLE ASPIRATION BIOPSIES;  Surgeon: Leslye Peer, MD;  Location: [facility] ENDOSCOPY;  Service: Pulmonary;;   BRONCHIAL WASHINGS  05/11/2022   Procedure: BRONCHIAL WASHINGS;  Surgeon: Leslye Peer, MD;  Location: [facility]  ENDOSCOPY;  Service: Pulmonary;;   IR IMAGING GUIDED PORT INSERTION  05/25/2022   LAPAROTOMY N/A 08/08/2021   Procedure: EXPLORATORY LAPAROTOMY;  Surgeon: Berna Bue, MD;  Location: [facility] OR;  Service: General;  Laterality: N/A;   PARTIAL COLECTOMY N/A 08/08/2021   Procedure: PARTIAL COLECTOMY WITH COLOSTOMY;  Surgeon: Berna Bue, MD;  Location: [facility] OR;  Service: General;  Laterality: N/A;   VIDEO BRONCHOSCOPY WITH RADIAL ENDOBRONCHIAL ULTRASOUND  05/11/2022   Procedure: VIDEO BRONCHOSCOPY WITH RADIAL ENDOBRONCHIAL ULTRASOUND;  Surgeon: Leslye Peer, MD;  Location: [facility] ENDOSCOPY;  Service: Pulmonary;;    I have reviewed the social history and family history with the patient and they are unchanged from previous note.  ALLERGIES:  has No Known Allergies.  MEDICATIONS:  Current Outpatient Medications  Medication Sig Dispense Refill   acetaminophen (TYLENOL) 500 MG tablet Take 2 tablets (1,000 mg total) by mouth every 6 (six) hours as needed for mild pain or moderate pain.  0   aspirin EC 81 MG tablet Take 81 mg by mouth daily as needed (for pain or headaches).      benztropine (COGENTIN) 1 MG tablet Take 1 mg at bedtime by mouth.     clindamycin (CLINDAGEL) 1 % gel Apply topically 2 (two) times daily. To skin rash on face and upper body 30 g 0   ferrous sulfate 325 (65 FE) MG EC tablet Take 1 tablet (325 mg total) by mouth daily. 30 tablet 3   hydrocortisone cream 1 % Apply 1 Application topically 2 (two) times daily as needed for itching. For rash 30 g 2   loperamide (IMODIUM) 2 MG capsule Take 1 capsule (2 mg total) by mouth as needed for diarrhea or loose stools. 30 capsule 0   magnesium oxide (MAG-OX) 400 (240 Mg) MG tablet Take 1 tablet (400 mg total) by mouth daily. 30 tablet 1   Multiple Vitamin (MULTIVITAMIN WITH MINERALS) TABS tablet Take 1 tablet by mouth daily.     ondansetron (ZOFRAN) 8 MG tablet Take 1 tablet (8 mg total) by mouth every 8 (eight) hours as needed for nausea  or vomiting. 30 tablet 0   paliperidone (INVEGA SUSTENNA) 156 MG/ML SUSP injection Inject 156 mg every 30 (thirty) days into the muscle.     polyethylene glycol (MIRALAX / GLYCOLAX) 17 g packet Take 17 g by mouth daily as needed for mild constipation or moderate constipation.  0   potassium chloride SA (KLOR-CON M) 20 MEQ tablet Take 1 tablet (20 mEq total) by mouth 2 (two) times daily. 60 tablet 0   prochlorperazine (COMPAZINE) 10 MG tablet TAKE 1 TABLET(10 MG) BY MOUTH EVERY 6 HOURS AS NEEDED FOR NAUSEA OR VOMITING 30 tablet 0   No current facility-administered medications for this visit.   Facility-Administered Medications Ordered in Other Visits  Medication Dose Route Frequency Provider Last Rate Last Admin   fluorouracil (ADRUCIL) 4,350 mg in sodium chloride 0.9 % 63 mL chemo infusion  2,400 mg/m2 (Treatment Plan Recorded) Intravenous 1 day or 1 dose Malachy Mood, MD   Infusion Verify at 01/06/23 1621   sodium chloride flush (NS) 0.9 % injection 10 mL  10 mL Intracatheter PRN Malachy Mood, MD        PHYSICAL EXAMINATION: ECOG PERFORMANCE STATUS: 1 -  Symptomatic but completely ambulatory  Vitals:   01/06/23 1107  BP: (!) 99/57  Pulse: 87  Resp: 18  Temp: 98.4 F (36.9 C)  SpO2: 100%   Wt Readings from Last 3 Encounters:  01/06/23 151 lb 3.2 oz (68.6 kg)  12/23/22 151 lb 12.8 oz (68.9 kg)  12/09/22 152 lb 4.8 oz (69.1 kg)     GENERAL:alert, no distress and comfortable SKIN: skin color normal, no rashes or significant lesions EYES: normal, Conjunctiva are pink and non-injected, sclera clear  NEURO: alert & oriented x 3 with fluent speech  LABORATORY DATA:  I have reviewed the data as listed    Latest Ref Rng & Units 01/06/2023   10:38 AM 12/23/2022    9:15 AM 12/09/2022    9:39 AM  CBC  WBC 4.0 - 10.5 K/uL 4.1  5.5  4.2   Hemoglobin 13.0 - 17.0 g/dL 16.1  09.6  04.5   Hematocrit 39.0 - 52.0 % 36.3  38.7  36.8   Platelets 150 - 400 K/uL 292  315  254         Latest Ref Rng  & Units 01/06/2023   10:38 AM 12/23/2022    9:15 AM 12/09/2022    9:39 AM  CMP  Glucose 70 - 99 mg/dL 409  811  914   BUN 6 - 20 mg/dL 11  12  12    Creatinine 0.61 - 1.24 mg/dL 7.82  9.56  2.13   Sodium 135 - 145 mmol/L 139  144  140   Potassium 3.5 - 5.1 mmol/L 3.4  3.5  3.7   Chloride 98 - 111 mmol/L 107  110  110   CO2 22 - 32 mmol/L 25  26  24    Calcium 8.9 - 10.3 mg/dL 9.1  9.0  8.8   Total Protein 6.5 - 8.1 g/dL 6.8  6.5  6.4   Total Bilirubin 0.3 - 1.2 mg/dL 0.8  0.5  0.7   Alkaline Phos 38 - 126 U/L 132  104  116   AST 15 - 41 U/L 24  14  19    ALT 0 - 44 U/L 37  13  24       RADIOGRAPHIC STUDIES: I have personally reviewed the radiological images as listed and agreed with the findings in the report. No results found.    Orders Placed This Encounter  Procedures   CBC with Differential (Cancer Center Only)    Standing Status:   Future    Standing Expiration Date:   02/03/2024   CMP (Cancer Center only)    Standing Status:   Future    Standing Expiration Date:   02/03/2024   Magnesium    Standing Status:   Future    Standing Expiration Date:   02/03/2024   All questions were answered. The patient knows to call the clinic with any problems, questions or concerns. No barriers to learning was detected. The total time spent in the appointment was 25 minutes.     Malachy Mood, MD 01/06/2023   Carolin Coy, CMA, am acting as scribe for Malachy Mood, MD.   I have reviewed the above documentation for accuracy and completeness, and I agree with the above.

## 2023-01-08 ENCOUNTER — Other Ambulatory Visit: Payer: Self-pay

## 2023-01-08 ENCOUNTER — Inpatient Hospital Stay: Payer: Medicaid Other

## 2023-01-08 VITALS — BP 96/66 | HR 82 | Temp 98.5°F | Resp 16

## 2023-01-08 DIAGNOSIS — C186 Malignant neoplasm of descending colon: Secondary | ICD-10-CM

## 2023-01-08 DIAGNOSIS — Z5111 Encounter for antineoplastic chemotherapy: Secondary | ICD-10-CM | POA: Diagnosis not present

## 2023-01-08 MED ORDER — SODIUM CHLORIDE 0.9% FLUSH
10.0000 mL | INTRAVENOUS | Status: DC | PRN
  Administered 2023-01-08: 10 mL

## 2023-01-08 MED ORDER — HEPARIN SOD (PORK) LOCK FLUSH 100 UNIT/ML IV SOLN
500.0000 [IU] | Freq: Once | INTRAVENOUS | Status: AC | PRN
  Administered 2023-01-08: 500 [IU]

## 2023-01-18 ENCOUNTER — Ambulatory Visit (HOSPITAL_COMMUNITY)
Admission: RE | Admit: 2023-01-18 | Discharge: 2023-01-18 | Disposition: A | Discharge status: Home / Self Care | Payer: Medicaid Other | Source: Ambulatory Visit | Attending: Nurse Practitioner | Admitting: Nurse Practitioner

## 2023-01-18 DIAGNOSIS — C186 Malignant neoplasm of descending colon: Secondary | ICD-10-CM | POA: Insufficient documentation

## 2023-01-18 MED ORDER — IOHEXOL 9 MG/ML PO SOLN
ORAL | Status: AC
  Filled 2023-01-18: qty 1000

## 2023-01-18 MED ORDER — IOHEXOL 300 MG/ML  SOLN
100.0000 mL | Freq: Once | INTRAMUSCULAR | Status: AC | PRN
  Administered 2023-01-18: 100 mL via INTRAVENOUS

## 2023-01-18 MED ORDER — IOHEXOL 9 MG/ML PO SOLN
1000.0000 mL | ORAL | Status: AC
  Administered 2023-01-18: 1000 mL via ORAL

## 2023-01-19 MED FILL — Dexamethasone Sodium Phosphate Inj 100 MG/10ML: INTRAMUSCULAR | Qty: 1 | Status: AC

## 2023-01-20 ENCOUNTER — Inpatient Hospital Stay: Payer: Medicaid Other

## 2023-01-20 ENCOUNTER — Other Ambulatory Visit: Payer: Self-pay

## 2023-01-20 ENCOUNTER — Inpatient Hospital Stay (HOSPITAL_BASED_OUTPATIENT_CLINIC_OR_DEPARTMENT_OTHER): Payer: Medicaid Other | Admitting: Hematology

## 2023-01-20 VITALS — BP 104/70 | HR 97 | Temp 98.5°F | Resp 18 | Ht 69.0 in | Wt 152.7 lb

## 2023-01-20 DIAGNOSIS — Z5111 Encounter for antineoplastic chemotherapy: Secondary | ICD-10-CM | POA: Diagnosis not present

## 2023-01-20 DIAGNOSIS — C186 Malignant neoplasm of descending colon: Secondary | ICD-10-CM

## 2023-01-20 DIAGNOSIS — C189 Malignant neoplasm of colon, unspecified: Secondary | ICD-10-CM

## 2023-01-20 DIAGNOSIS — Z95828 Presence of other vascular implants and grafts: Secondary | ICD-10-CM

## 2023-01-20 LAB — CBC WITH DIFFERENTIAL (CANCER CENTER ONLY)
Abs Immature Granulocytes: 0.01 10*3/uL (ref 0.00–0.07)
Basophils Absolute: 0 10*3/uL (ref 0.0–0.1)
Basophils Relative: 1 %
Eosinophils Absolute: 0.1 10*3/uL (ref 0.0–0.5)
Eosinophils Relative: 2 %
HCT: 38 % — ABNORMAL LOW (ref 39.0–52.0)
Hemoglobin: 12.5 g/dL — ABNORMAL LOW (ref 13.0–17.0)
Immature Granulocytes: 0 %
Lymphocytes Relative: 19 %
Lymphs Abs: 0.9 10*3/uL (ref 0.7–4.0)
MCH: 29.4 pg (ref 26.0–34.0)
MCHC: 32.9 g/dL (ref 30.0–36.0)
MCV: 89.4 fL (ref 80.0–100.0)
Monocytes Absolute: 0.4 10*3/uL (ref 0.1–1.0)
Monocytes Relative: 8 %
Neutro Abs: 3.2 10*3/uL (ref 1.7–7.7)
Neutrophils Relative %: 70 %
Platelet Count: 295 10*3/uL (ref 150–400)
RBC: 4.25 MIL/uL (ref 4.22–5.81)
RDW: 16.7 % — ABNORMAL HIGH (ref 11.5–15.5)
WBC Count: 4.6 10*3/uL (ref 4.0–10.5)
nRBC: 0 % (ref 0.0–0.2)

## 2023-01-20 LAB — CMP (CANCER CENTER ONLY)
ALT: 31 U/L (ref 0–44)
AST: 17 U/L (ref 15–41)
Albumin: 3.7 g/dL (ref 3.5–5.0)
Alkaline Phosphatase: 127 U/L — ABNORMAL HIGH (ref 38–126)
Anion gap: 8 (ref 5–15)
BUN: 8 mg/dL (ref 6–20)
CO2: 24 mmol/L (ref 22–32)
Calcium: 9 mg/dL (ref 8.9–10.3)
Chloride: 105 mmol/L (ref 98–111)
Creatinine: 0.83 mg/dL (ref 0.61–1.24)
GFR, Estimated: >60 mL/min (ref >60)
Glucose, Bld: 135 mg/dL — ABNORMAL HIGH (ref 70–99)
Potassium: 3.4 mmol/L — ABNORMAL LOW (ref 3.5–5.1)
Sodium: 137 mmol/L (ref 135–145)
Total Bilirubin: 0.9 mg/dL (ref 0.3–1.2)
Total Protein: 6.2 g/dL — ABNORMAL LOW (ref 6.5–8.1)

## 2023-01-20 LAB — MAGNESIUM: Magnesium: 1.7 mg/dL (ref 1.7–2.4)

## 2023-01-20 MED ORDER — SODIUM CHLORIDE 0.9 % IV SOLN: Freq: Once | INTRAVENOUS | Status: AC

## 2023-01-20 MED ORDER — SODIUM CHLORIDE 0.9 % IV SOLN
6.0000 mg/kg | Freq: Once | INTRAVENOUS | Status: AC
  Administered 2023-01-20: 400 mg via INTRAVENOUS
  Filled 2023-01-20: qty 20

## 2023-01-20 MED ORDER — MAGNESIUM OXIDE -MG SUPPLEMENT 400 (240 MG) MG PO TABS: 400.0000 mg | ORAL_TABLET | Freq: Every day | ORAL | 2 refills | Status: DC

## 2023-01-20 MED ORDER — HEPARIN SOD (PORK) LOCK FLUSH 100 UNIT/ML IV SOLN: 500.0000 [IU] | Freq: Once | INTRAVENOUS | Status: DC | PRN

## 2023-01-20 MED ORDER — PALONOSETRON HCL INJECTION 0.25 MG/5ML
0.2500 mg | Freq: Once | INTRAVENOUS | Status: AC
  Administered 2023-01-20: 0.25 mg via INTRAVENOUS
  Filled 2023-01-20: qty 5

## 2023-01-20 MED ORDER — SODIUM CHLORIDE 0.9 % IV SOLN
2400.0000 mg/m2 | INTRAVENOUS | Status: DC
  Administered 2023-01-20: 4350 mg via INTRAVENOUS
  Filled 2023-01-20: qty 87

## 2023-01-20 MED ORDER — POTASSIUM CHLORIDE CRYS ER 20 MEQ PO TBCR: 20.0000 meq | EXTENDED_RELEASE_TABLET | Freq: Every day | ORAL | 2 refills | Status: DC

## 2023-01-20 MED ORDER — SODIUM CHLORIDE 0.9 % IV SOLN
400.0000 mg/m2 | Freq: Once | INTRAVENOUS | Status: AC
  Administered 2023-01-20: 728 mg via INTRAVENOUS
  Filled 2023-01-20: qty 25

## 2023-01-20 MED ORDER — SODIUM CHLORIDE 0.9 % IV SOLN
10.0000 mg | Freq: Once | INTRAVENOUS | Status: AC
  Administered 2023-01-20: 10 mg via INTRAVENOUS
  Filled 2023-01-20: qty 10

## 2023-01-20 MED ORDER — SODIUM CHLORIDE 0.9% FLUSH
10.0000 mL | Freq: Once | INTRAVENOUS | Status: AC
  Administered 2023-01-20: 10 mL

## 2023-01-20 NOTE — Progress Notes (Deleted)
[facility]   [phone] [phone]) [phone]   Clinic Follow up Note   Patient Care Team: Medicine, [facility] as PCP - General Berna Bue, MD as Consulting Physician (General Surgery) Malachy Mood, MD as Consulting Physician (Oncology)  Date of Service:  01/20/2023  CHIEF COMPLAINT: f/u of metastatic colon cancer    CURRENT THERAPY:  FOLFIRI starting 05/08/22 Vectibix (added with C2); q2 weeks; changed to maintenance 5FU/leuc and vectibix 10/28/22     ASSESSMENT: *** Matthew [name] is a 57 y.o. male with   No problem-specific Assessment & Plan notes found for this encounter.  ***   PLAN:     SUMMARY OF ONCOLOGIC HISTORY: Oncology History Overview Note   Cancer Staging  Cancer of left colon [facility]) Staging form: Colon and Rectum, AJCC 8th Edition - Pathologic stage from 08/08/2021: Stage IIIB (pT3, pN1a, cM0) - Signed by Malachy Mood, MD on 08/26/2021    Cancer of left colon (HCC)  04/01/2020 Imaging   IMPRESSION: 1. Gallbladder decompressed bowel also partially calcified gallstones. No pericholecystic inflammation though if there is persisting clinical concern for cholecystitis right upper quadrant ultrasound could be obtained. 2. Circumferential thickening of the distal thoracic esophagus. Could reflect features of esophagitis. Correlate with clinical symptoms and consider endoscopy as clinically warranted. 3. Additional segmental thickening of the mid to distal sigmoid with focal narrowing. No acute surrounding inflammation or resulting obstruction. Findings are nonspecific, and could reflect sequela of prior inflammation/infection. However, recommend correlation with colonoscopy if not recently performed. 4. Mild circumferential bladder wall thickening and indentation of the bladder base by an enlarged prostate. Possibly sequela of chronic outlet obstruction though could correlate with urinalysis to exclude cystitis. 5.  Aortic Atherosclerosis (ICD10-I70.0).   08/06/2021 Imaging   IMPRESSION: Sigmoid colonic perforation with small free intraperitoneal gas and infiltration of the mesenteric and omental fat in keeping with changes of peritonitis.   Long segment inflammatory stranding of the sigmoid colon in keeping with a severe infectious or inflammatory colitis. This terminates an area of irregular mural thickening, infiltrative soft tissue within the colonic mesentery, and focal dystrophic calcification. This may represent a chronic inflammatory process, however, a perforated malignancy could appear similarly. There are 2 separate points of perforation which again raise the question of an underlying malignancy.   7.1 cm gas and fluid containing pericolonic abscess within the sigmoid mesentery.   Marked inflammatory change of the terminal ileum adjacent to the pericolonic abscess with resultant small bowel obstruction. Fluid within the distal esophagus likely relates to gastroesophageal reflux the setting of vomiting.   Aortic Atherosclerosis (ICD10-I70.0).   08/08/2021 Cancer Staging   Staging form: Colon and Rectum, AJCC 8th Edition - Pathologic stage from 08/08/2021: Stage IIIB (pT3, pN1a, cM0) - Signed by Malachy Mood, MD on 08/26/2021 Stage prefix: Initial diagnosis Total positive nodes: 1 Histologic grading system: 4 grade system Histologic grade (G): G2 Residual tumor (R): R0 - None   08/08/2021 Definitive Surgery   FINAL MICROSCOPIC DIAGNOSIS:   A. COLON, SIGMOID, PARTIAL COLECTOMY:  - Invasive moderately differentiated adenocarcinoma.  - Metastatic carcinoma involving one of twelve lymph nodes (1/12).  - See oncology table below.   ADDENDUM:  Mismatch Repair Protein (IHC)  SUMMARY INTERPRETATION: NORMAL    08/14/2021 Imaging   EXAM: CT ABDOMEN AND PELVIS WITH CONTRAST  IMPRESSION: 1. Post recent sigmoid colectomy with left lower quadrant colostomy. Two small residual foci of air within the  pelvic mesentery with mild adjacent thickening, but no  abscess or drainable collection. Trace non organized free fluid and stranding in the pelvis. 2. Short segment of small bowel wall thickening and inflammation in the pelvis involving the distal ileum, likely reactive. 3. Dilated distal esophagus, stomach, and small bowel, without discrete transition point, favoring postoperative ileus. 4. Small bilateral pleural effusions and compressive atelectasis. 5. Heterogeneous partially enhancing 14 mm lymph node in the retroperitoneum at the aortoiliac bifurcation, not significantly changed from prior exam. Suspected additional lymph nodes in the anterior common iliac space, not significantly changed from prior exam. Recommend attention at follow-up. 6. Additional chronic findings as described.     08/15/2021 Imaging   EXAM: CT CHEST WITH CONTRAST  IMPRESSION: 1. No evidence of thoracic metastasis. 2. Bilateral small layering pleural effusions with passive atelectasis   08/26/2021 Initial Diagnosis   Cancer of left colon (HCC)   10/10/2021 - 12/12/2021 Chemotherapy   Patient is on Treatment Plan : COLORECTAL Xelox (Capeox) q21d     05/28/2022 - 05/28/2022 Chemotherapy   Patient is on Treatment Plan : COLORECTAL FOLFIRI + Bevacizumab q14d     05/28/2022 -  Chemotherapy   Patient is on Treatment Plan : COLORECTAL FOLFIRI + Panitumumab q14d     07/31/2022 Imaging    IMPRESSION: Decreased bilateral pulmonary metastases.   Stable mild abdominal lymphadenopathy.   No new or progressive metastatic disease within the chest, abdomen, or pelvis.      INTERVAL HISTORY: *** Matthew [name] is here for a follow up of     All other systems were reviewed with the patient and are negative.  MEDICAL HISTORY:  Past Medical History:  Diagnosis Date   Cancer (HCC)    Schizophrenia [facility])     SURGICAL HISTORY: Past Surgical History:  Procedure Laterality Date   BRONCHIAL BIOPSY   05/11/2022   Procedure: BRONCHIAL BIOPSIES;  Surgeon: Leslye Peer, MD;  Location: [facility] ENDOSCOPY;  Service: Pulmonary;;   BRONCHIAL BRUSHINGS  05/11/2022   Procedure: BRONCHIAL BRUSHINGS;  Surgeon: Leslye Peer, MD;  Location: [facility] ENDOSCOPY;  Service: Pulmonary;;   BRONCHIAL NEEDLE ASPIRATION BIOPSY  05/11/2022   Procedure: BRONCHIAL NEEDLE ASPIRATION BIOPSIES;  Surgeon: Leslye Peer, MD;  Location: [facility] ENDOSCOPY;  Service: Pulmonary;;   BRONCHIAL WASHINGS  05/11/2022   Procedure: BRONCHIAL WASHINGS;  Surgeon: Leslye Peer, MD;  Location: [facility] ENDOSCOPY;  Service: Pulmonary;;   IR IMAGING GUIDED PORT INSERTION  05/25/2022   LAPAROTOMY N/A 08/08/2021   Procedure: EXPLORATORY LAPAROTOMY;  Surgeon: Berna Bue, MD;  Location: [facility] OR;  Service: General;  Laterality: N/A;   PARTIAL COLECTOMY N/A 08/08/2021   Procedure: PARTIAL COLECTOMY WITH COLOSTOMY;  Surgeon: Berna Bue, MD;  Location: [facility] OR;  Service: General;  Laterality: N/A;   VIDEO BRONCHOSCOPY WITH RADIAL ENDOBRONCHIAL ULTRASOUND  05/11/2022   Procedure: VIDEO BRONCHOSCOPY WITH RADIAL ENDOBRONCHIAL ULTRASOUND;  Surgeon: Leslye Peer, MD;  Location: [facility] ENDOSCOPY;  Service: Pulmonary;;    I have reviewed the social history and family history with the patient and they are unchanged from previous note.  ALLERGIES:  has No Known Allergies.  MEDICATIONS:  Current Outpatient Medications  Medication Sig Dispense Refill   acetaminophen (TYLENOL) 500 MG tablet Take 2 tablets (1,000 mg total) by mouth every 6 (six) hours as needed for mild pain or moderate pain.  0   aspirin EC 81 MG tablet Take 81 mg by mouth daily as needed (for pain or headaches).      benztropine (COGENTIN) 1 MG tablet Take 1  mg at bedtime by mouth.     clindamycin (CLINDAGEL) 1 % gel Apply topically 2 (two) times daily. To skin rash on face and upper body 30 g 0   ferrous sulfate 325 (65 FE) MG EC tablet Take 1 tablet (325 mg total) by mouth daily. 30 tablet 3    hydrocortisone cream 1 % Apply 1 Application topically 2 (two) times daily as needed for itching. For rash 30 g 2   loperamide (IMODIUM) 2 MG capsule Take 1 capsule (2 mg total) by mouth as needed for diarrhea or loose stools. 30 capsule 0   magnesium oxide (MAG-OX) 400 (240 Mg) MG tablet Take 1 tablet (400 mg total) by mouth daily. 30 tablet 1   Multiple Vitamin (MULTIVITAMIN WITH MINERALS) TABS tablet Take 1 tablet by mouth daily.     ondansetron (ZOFRAN) 8 MG tablet Take 1 tablet (8 mg total) by mouth every 8 (eight) hours as needed for nausea or vomiting. 30 tablet 0   paliperidone (INVEGA SUSTENNA) 156 MG/ML SUSP injection Inject 156 mg every 30 (thirty) days into the muscle.     polyethylene glycol (MIRALAX / GLYCOLAX) 17 g packet Take 17 g by mouth daily as needed for mild constipation or moderate constipation.  0   potassium chloride SA (KLOR-CON M) 20 MEQ tablet Take 1 tablet (20 mEq total) by mouth 2 (two) times daily. 60 tablet 0   prochlorperazine (COMPAZINE) 10 MG tablet TAKE 1 TABLET(10 MG) BY MOUTH EVERY 6 HOURS AS NEEDED FOR NAUSEA OR VOMITING 30 tablet 0   No current facility-administered medications for this visit.    PHYSICAL EXAMINATION: ECOG PERFORMANCE STATUS: {CHL ONC ECOG [phone]}  There were no vitals filed for this visit. Wt Readings from Last 3 Encounters:  01/06/23 151 lb 3.2 oz (68.6 kg)  12/23/22 151 lb 12.8 oz (68.9 kg)  12/09/22 152 lb 4.8 oz (69.1 kg)    {Only keep what was examined. If exam not performed, can use .CEXAM } GENERAL:alert, no distress and comfortable SKIN: skin color, texture, turgor are normal, no rashes or significant lesions EYES: normal, Conjunctiva are pink and non-injected, sclera clear {OROPHARYNX:no exudate, no erythema and lips, buccal mucosa, and tongue normal}  NECK: supple, thyroid normal size, non-tender, without nodularity LYMPH:  no palpable lymphadenopathy in the cervical, axillary {or inguinal} LUNGS: clear to  auscultation and percussion with normal breathing effort HEART: regular rate & rhythm and no murmurs and no lower extremity edema ABDOMEN:abdomen soft, non-tender and normal bowel sounds Musculoskeletal:no cyanosis of digits and no clubbing  NEURO: alert & oriented x 3 with fluent speech, no focal motor/sensory deficits  LABORATORY DATA:  I have reviewed the data as listed    Latest Ref Rng & Units 01/06/2023   10:38 AM 12/23/2022    9:15 AM 12/09/2022    9:39 AM  CBC  WBC 4.0 - 10.5 K/uL 4.1  5.5  4.2   Hemoglobin 13.0 - 17.0 g/dL 16.1  09.6  04.5   Hematocrit 39.0 - 52.0 % 36.3  38.7  36.8   Platelets 150 - 400 K/uL 292  315  254         Latest Ref Rng & Units 01/06/2023   10:38 AM 12/23/2022    9:15 AM 12/09/2022    9:39 AM  CMP  Glucose 70 - 99 mg/dL 409  811  914   BUN 6 - 20 mg/dL 11  12  12    Creatinine 0.61 - 1.24 mg/dL 7.82  0.83  0.71   Sodium 135 - 145 mmol/L 139  144  140   Potassium 3.5 - 5.1 mmol/L 3.4  3.5  3.7   Chloride 98 - 111 mmol/L 107  110  110   CO2 22 - 32 mmol/L 25  26  24    Calcium 8.9 - 10.3 mg/dL 9.1  9.0  8.8   Total Protein 6.5 - 8.1 g/dL 6.8  6.5  6.4   Total Bilirubin 0.3 - 1.2 mg/dL 0.8  0.5  0.7   Alkaline Phos 38 - 126 U/L 132  104  116   AST 15 - 41 U/L 24  14  19    ALT 0 - 44 U/L 37  13  24       RADIOGRAPHIC STUDIES: I have personally reviewed the radiological images as listed and agreed with the findings in the report. No results found.    No orders of the defined types were placed in this encounter.  All questions were answered. The patient knows to call the clinic with any problems, questions or concerns. No barriers to learning was detected. The total time spent in the appointment was {CHL ONC TIME VISIT - [id number]}.     Verlee Rossetti, CMA 01/20/2023   I, Sharlette Dense, CMA, am acting as scribe for Malachy Mood, MD.   {Add scribe attestation statement}

## 2023-01-20 NOTE — Progress Notes (Signed)
[facility]   Telephone:(336) [phone] Fax:(336) [phone]   Clinic Follow up Note   Patient Care Team: Medicine, [facility] as PCP - General Berna Bue, MD as Consulting Physician (General Surgery) Malachy Mood, MD as Consulting Physician (Oncology)  Date of Service:  01/20/2023  CHIEF COMPLAINT: f/u of metastatic colon cancer   CURRENT THERAPY:  FOLFIRI starting 05/08/22 Vectibix (added with C2); q2 weeks; changed to maintenance 5FU/leuc and vectibix 10/28/22   ASSESSMENT:  Matthew [name] is a 57 y.o. 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 -we have changed his treatment to maintenance therapy with 5-fu and vectibix in late March 2024 -He is tolerating treatment very well, will continue. -Patient's mother asked the role of radiation, due to metastasis in both lungs and retroperitoneal lymph nodes, he is probably not a candidate for consolidation radiation  Schizophrenia [facility]) he was diagnosed with schizophrenia in his 20's, managed with medication, he is on disability. -he lives with his mother; he is able to do ADLs for himself, but not iADLS. They are both on disability. His mother also seems to have some kind of mental impairment       PLAN: - reviewed labs - Ok to proceed with treatment  today - reviewed meds for refills - recommend maintance therapy - next  treatment is July 3   SUMMARY OF ONCOLOGIC HISTORY: Oncology History Overview Note   Cancer Staging  Cancer of left colon [facility]) Staging form: Colon and Rectum, AJCC 8th Edition - Pathologic stage from 08/08/2021: Stage IIIB (pT3, pN1a, cM0) - Signed by Malachy Mood, MD on 08/26/2021    Cancer of left colon (HCC)  04/01/2020 Imaging   IMPRESSION: 1. Gallbladder decompressed bowel also partially calcified gallstones. No pericholecystic inflammation though if there is persisting clinical concern for cholecystitis right upper quadrant ultrasound could be obtained. 2. Circumferential thickening of the distal thoracic esophagus. Could reflect features of esophagitis. Correlate with clinical symptoms and consider endoscopy as clinically warranted. 3. Additional segmental thickening of the mid to distal sigmoid with focal narrowing. No acute surrounding inflammation or resulting obstruction. Findings are nonspecific, and could reflect sequela of prior inflammation/infection. However, recommend correlation with colonoscopy if not recently performed. 4. Mild circumferential bladder wall thickening and indentation of the bladder base by an enlarged prostate. Possibly sequela of chronic outlet obstruction though could correlate with urinalysis to exclude cystitis. 5. Aortic Atherosclerosis (ICD10-I70.0).   08/06/2021 Imaging   IMPRESSION: Sigmoid colonic perforation with small free intraperitoneal gas and infiltration of the mesenteric and omental fat in keeping with changes of peritonitis.   Long segment inflammatory stranding of the sigmoid colon in keeping with a severe infectious or inflammatory colitis. This terminates an area of irregular mural thickening, infiltrative soft tissue within the colonic mesentery, and focal dystrophic calcification. This may represent a chronic inflammatory process, however, a perforated malignancy could appear similarly. There are 2 separate points of perforation  which again raise the  question of an underlying malignancy.   7.1 cm gas and fluid containing pericolonic abscess within the sigmoid mesentery.   Marked inflammatory change of the terminal ileum adjacent to the pericolonic abscess with resultant small bowel obstruction. Fluid within the distal esophagus likely relates to gastroesophageal reflux the setting of vomiting.   Aortic Atherosclerosis (ICD10-I70.0).   08/08/2021 Cancer Staging   Staging form: Colon and Rectum, AJCC 8th Edition - Pathologic stage from 08/08/2021: Stage IIIB (pT3, pN1a, cM0) - Signed by Malachy Mood, MD on 08/26/2021 Stage prefix: Initial diagnosis Total positive nodes: 1 Histologic grading system: 4 grade system Histologic grade (G): G2 Residual tumor (R): R0 - None   08/08/2021 Definitive Surgery   FINAL MICROSCOPIC DIAGNOSIS:   A. COLON, SIGMOID, PARTIAL COLECTOMY:  - Invasive moderately differentiated adenocarcinoma.  - Metastatic carcinoma involving one of twelve lymph nodes (1/12).  - See oncology table below.   ADDENDUM:  Mismatch Repair Protein (IHC)  SUMMARY INTERPRETATION: NORMAL    08/14/2021 Imaging   EXAM: CT ABDOMEN AND PELVIS WITH CONTRAST  IMPRESSION: 1. Post recent sigmoid colectomy with left lower quadrant colostomy. Two small residual foci of air within the pelvic mesentery with mild adjacent thickening, but no abscess or drainable collection. Trace non organized free fluid and stranding in the pelvis. 2. Short segment of small bowel wall thickening and inflammation in the pelvis involving the distal ileum, likely reactive. 3. Dilated distal esophagus, stomach, and small bowel, without discrete transition point, favoring postoperative ileus. 4. Small bilateral pleural effusions and compressive atelectasis. 5. Heterogeneous partially enhancing 14 mm lymph node in the retroperitoneum at the aortoiliac bifurcation, not significantly changed from prior exam. Suspected additional lymph nodes in  the anterior common iliac space, not significantly changed from prior exam. Recommend attention at follow-up. 6. Additional chronic findings as described.     08/15/2021 Imaging   EXAM: CT CHEST WITH CONTRAST  IMPRESSION: 1. No evidence of thoracic metastasis. 2. Bilateral small layering pleural effusions with passive atelectasis   08/26/2021 Initial Diagnosis   Cancer of left colon (HCC)   10/10/2021 - 12/12/2021 Chemotherapy   Patient is on Treatment Plan : COLORECTAL Xelox (Capeox) q21d     05/28/2022 - 05/28/2022 Chemotherapy   Patient is on Treatment Plan : COLORECTAL FOLFIRI + Bevacizumab q14d     05/28/2022 -  Chemotherapy   Patient is on Treatment Plan : COLORECTAL FOLFIRI + Panitumumab q14d     07/31/2022 Imaging    IMPRESSION: Decreased bilateral pulmonary metastases.   Stable mild abdominal lymphadenopathy.   No new or progressive metastatic disease within the chest, abdomen, or pelvis.      INTERVAL HISTORY:  Matthew [name] is here for a follow up of metastatic colon cancer. He was last seen by NP Lacie on 12/23/2022. He presents to the clinic accompanied by mother. Rash has improved skin is still dry. Doing well overall.      All other systems were reviewed with the patient and are negative.  MEDICAL HISTORY:  Past Medical History:  Diagnosis Date   Cancer (HCC)    Schizophrenia [facility])     SURGICAL HISTORY: Past Surgical History:  Procedure Laterality Date   BRONCHIAL BIOPSY  05/11/2022   Procedure: BRONCHIAL BIOPSIES;  Surgeon: Leslye Peer, MD;  Location: [facility] ENDOSCOPY;  Service: Pulmonary;;   BRONCHIAL BRUSHINGS  05/11/2022   Procedure: BRONCHIAL BRUSHINGS;  Surgeon: Leslye Peer, MD;  Location: [facility] ENDOSCOPY;  Service: Pulmonary;;   BRONCHIAL NEEDLE ASPIRATION BIOPSY  05/11/2022  Procedure: BRONCHIAL NEEDLE ASPIRATION BIOPSIES;  Surgeon: Leslye Peer, MD;  Location: [facility] ENDOSCOPY;  Service: Pulmonary;;   BRONCHIAL WASHINGS  05/11/2022    Procedure: BRONCHIAL WASHINGS;  Surgeon: Leslye Peer, MD;  Location: [facility] ENDOSCOPY;  Service: Pulmonary;;   IR IMAGING GUIDED PORT INSERTION  05/25/2022   LAPAROTOMY N/A 08/08/2021   Procedure: EXPLORATORY LAPAROTOMY;  Surgeon: Berna Bue, MD;  Location: [facility] OR;  Service: General;  Laterality: N/A;   PARTIAL COLECTOMY N/A 08/08/2021   Procedure: PARTIAL COLECTOMY WITH COLOSTOMY;  Surgeon: Berna Bue, MD;  Location: [facility] OR;  Service: General;  Laterality: N/A;   VIDEO BRONCHOSCOPY WITH RADIAL ENDOBRONCHIAL ULTRASOUND  05/11/2022   Procedure: VIDEO BRONCHOSCOPY WITH RADIAL ENDOBRONCHIAL ULTRASOUND;  Surgeon: Leslye Peer, MD;  Location: [facility] ENDOSCOPY;  Service: Pulmonary;;    I have reviewed the social history and family history with the patient and they are unchanged from previous note.  ALLERGIES:  has No Known Allergies.  MEDICATIONS:  Current Outpatient Medications  Medication Sig Dispense Refill   acetaminophen (TYLENOL) 500 MG tablet Take 2 tablets (1,000 mg total) by mouth every 6 (six) hours as needed for mild pain or moderate pain.  0   aspirin EC 81 MG tablet Take 81 mg by mouth daily as needed (for pain or headaches).      benztropine (COGENTIN) 1 MG tablet Take 1 mg at bedtime by mouth.     clindamycin (CLINDAGEL) 1 % gel Apply topically 2 (two) times daily. To skin rash on face and upper body 30 g 0   ferrous sulfate 325 (65 FE) MG EC tablet Take 1 tablet (325 mg total) by mouth daily. 30 tablet 3   hydrocortisone cream 1 % Apply 1 Application topically 2 (two) times daily as needed for itching. For rash 30 g 2   loperamide (IMODIUM) 2 MG capsule Take 1 capsule (2 mg total) by mouth as needed for diarrhea or loose stools. 30 capsule 0   magnesium oxide (MAG-OX) 400 (240 Mg) MG tablet Take 1 tablet (400 mg total) by mouth daily. 30 tablet 2   Multiple Vitamin (MULTIVITAMIN WITH MINERALS) TABS tablet Take 1 tablet by mouth daily.     ondansetron (ZOFRAN) 8 MG tablet  Take 1 tablet (8 mg total) by mouth every 8 (eight) hours as needed for nausea or vomiting. 30 tablet 0   paliperidone (INVEGA SUSTENNA) 156 MG/ML SUSP injection Inject 156 mg every 30 (thirty) days into the muscle.     polyethylene glycol (MIRALAX / GLYCOLAX) 17 g packet Take 17 g by mouth daily as needed for mild constipation or moderate constipation.  0   potassium chloride SA (KLOR-CON M) 20 MEQ tablet Take 1 tablet (20 mEq total) by mouth daily. 30 tablet 2   prochlorperazine (COMPAZINE) 10 MG tablet TAKE 1 TABLET(10 MG) BY MOUTH EVERY 6 HOURS AS NEEDED FOR NAUSEA OR VOMITING 30 tablet 0   No current facility-administered medications for this visit.    PHYSICAL EXAMINATION: ECOG PERFORMANCE STATUS: 1 - Symptomatic but completely ambulatory  Vitals:   01/20/23 1033  BP: 104/70  Pulse: 97  Resp: 18  Temp: 98.5 F (36.9 C)  SpO2: 99%   Wt Readings from Last 3 Encounters:  01/20/23 152 lb 11.2 oz (69.3 kg)  01/06/23 151 lb 3.2 oz (68.6 kg)  12/23/22 151 lb 12.8 oz (68.9 kg)     GENERAL:alert, no distress and comfortable SKIN: skin color normal, no rashes or significant lesions EYES:  normal, Conjunctiva are pink and non-injected, sclera clear  NEURO: alert & oriented x 3 with fluent speech  LABORATORY DATA:  I have reviewed the data as listed    Latest Ref Rng & Units 01/20/2023   10:00 AM 01/06/2023   10:38 AM 12/23/2022    9:15 AM  CBC  WBC 4.0 - 10.5 K/uL 4.6  4.1  5.5   Hemoglobin 13.0 - 17.0 g/dL 16.1  09.6  04.5   Hematocrit 39.0 - 52.0 % 38.0  36.3  38.7   Platelets 150 - 400 K/uL 295  292  315         Latest Ref Rng & Units 01/06/2023   10:38 AM 12/23/2022    9:15 AM 12/09/2022    9:39 AM  CMP  Glucose 70 - 99 mg/dL 409  811  914   BUN 6 - 20 mg/dL 11  12  12    Creatinine 0.61 - 1.24 mg/dL 7.82  9.56  2.13   Sodium 135 - 145 mmol/L 139  144  140   Potassium 3.5 - 5.1 mmol/L 3.4  3.5  3.7   Chloride 98 - 111 mmol/L 107  110  110   CO2 22 - 32 mmol/L 25  26  24     Calcium 8.9 - 10.3 mg/dL 9.1  9.0  8.8   Total Protein 6.5 - 8.1 g/dL 6.8  6.5  6.4   Total Bilirubin 0.3 - 1.2 mg/dL 0.8  0.5  0.7   Alkaline Phos 38 - 126 U/L 132  104  116   AST 15 - 41 U/L 24  14  19    ALT 0 - 44 U/L 37  13  24       RADIOGRAPHIC STUDIES: I have personally reviewed the radiological images as listed and agreed with the findings in the report. No results found.    No orders of the defined types were placed in this encounter.  All questions were answered. The patient knows to call the clinic with any problems, questions or concerns. No barriers to learning was detected. The total time spent in the appointment was 25 minutes.     Malachy Mood, MD 01/20/2023  I, Sharlette Dense, CMA, am acting as scribe for Malachy Mood, MD.   I have reviewed the above documentation for accuracy and completeness, and I agree with the above.

## 2023-01-20 NOTE — Patient Instructions (Addendum)
[facility]  Discharge Instructions: Thank you for choosing [facility] to provide your oncology and hematology care.   If you have a lab appointment with the Cancer Center, please go directly to the Cancer Center and check in at the registration area.   Wear comfortable clothing and clothing appropriate for easy access to any Portacath or PICC line.   We strive to give you quality time with your provider. You may need to reschedule your appointment if you arrive late (15 or more minutes).  Arriving late affects you and other patients whose appointments are after yours.  Also, if you miss three or more appointments without notifying the office, you may be dismissed from the clinic at the provider's discretion.      For prescription refill requests, have your pharmacy contact our office and allow 72 hours for refills to be completed.    Today you received the following chemotherapy and/or immunotherapy agents leucovorin, 5FU, Vectibix      To help prevent nausea and vomiting after your treatment, we encourage you to take your nausea medication as directed.  BELOW ARE SYMPTOMS THAT SHOULD BE REPORTED IMMEDIATELY: *FEVER GREATER THAN 100.4 F (38 C) OR HIGHER *CHILLS OR SWEATING *NAUSEA AND VOMITING THAT IS NOT CONTROLLED WITH YOUR NAUSEA MEDICATION *UNUSUAL SHORTNESS OF BREATH *UNUSUAL BRUISING OR BLEEDING *URINARY PROBLEMS (pain or burning when urinating, or frequent urination) *BOWEL PROBLEMS (unusual diarrhea, constipation, pain near the anus) TENDERNESS IN MOUTH AND THROAT WITH OR WITHOUT PRESENCE OF ULCERS (sore throat, sores in mouth, or a toothache) UNUSUAL RASH, SWELLING OR PAIN  UNUSUAL VAGINAL DISCHARGE OR ITCHING   Items with * indicate a potential emergency and should be followed up as soon as possible or go to the Emergency Department if any problems should occur.  Please show the CHEMOTHERAPY ALERT CARD or IMMUNOTHERAPY  ALERT CARD at check-in to the Emergency Department and triage nurse.  Should you have questions after your visit or need to cancel or reschedule your appointment, please contact [facility]  Dept: [phone]  and follow the prompts.  Office hours are 8:00 a.m. to 4:30 p.m. Monday - Friday. Please note that voicemails left after 4:00 p.m. may not be returned until the following business day.  We are closed weekends and major holidays. You have access to a nurse at all times for urgent questions. Please call the main number to the clinic Dept: [phone] and follow the prompts.   For any non-urgent questions, you may also contact your provider using MyChart. We now offer e-Visits for anyone 40 and older to request care online for non-urgent symptoms. For details visit [email or web address].   Also download the MyChart app! Go to the app store, search "MyChart", open the app, select [facility], and log in with your MyChart username and password.

## 2023-01-22 ENCOUNTER — Other Ambulatory Visit: Payer: Self-pay

## 2023-01-22 ENCOUNTER — Inpatient Hospital Stay: Payer: Medicaid Other

## 2023-01-22 DIAGNOSIS — C7800 Secondary malignant neoplasm of unspecified lung: Secondary | ICD-10-CM

## 2023-01-22 DIAGNOSIS — Z95828 Presence of other vascular implants and grafts: Secondary | ICD-10-CM

## 2023-01-22 DIAGNOSIS — Z5111 Encounter for antineoplastic chemotherapy: Secondary | ICD-10-CM | POA: Diagnosis not present

## 2023-01-22 DIAGNOSIS — C186 Malignant neoplasm of descending colon: Secondary | ICD-10-CM

## 2023-01-22 MED ORDER — SODIUM CHLORIDE 0.9% FLUSH
10.0000 mL | Freq: Once | INTRAVENOUS | Status: AC
  Administered 2023-01-22: 10 mL

## 2023-01-22 MED ORDER — HEPARIN SOD (PORK) LOCK FLUSH 100 UNIT/ML IV SOLN
500.0000 [IU] | Freq: Once | INTRAVENOUS | Status: AC
  Administered 2023-01-22: 500 [IU]

## 2023-01-25 ENCOUNTER — Telehealth: Payer: Self-pay

## 2023-01-25 NOTE — Telephone Encounter (Addendum)
Called patient to relay message below as per Santiago Glad NP. Patients mother voiced full understanding.   ----- Message from Pollyann Samples, NP sent at 01/24/2023  9:02 AM EDT ----- Please let pt know CT 6/17 is stable, no new/progressive disease. Continue maintenance treatment. Not sure if it was read/reviewed in last visit.   Thanks, [name], NP

## 2023-02-01 ENCOUNTER — Telehealth: Payer: Self-pay | Admitting: Hematology

## 2023-02-01 ENCOUNTER — Encounter: Payer: Self-pay | Admitting: Hematology

## 2023-02-02 ENCOUNTER — Other Ambulatory Visit: Payer: Self-pay

## 2023-02-03 ENCOUNTER — Other Ambulatory Visit: Payer: Medicaid Other

## 2023-02-03 ENCOUNTER — Ambulatory Visit: Payer: Medicaid Other

## 2023-02-03 ENCOUNTER — Ambulatory Visit: Payer: Medicaid Other | Admitting: Hematology

## 2023-02-16 MED FILL — Dexamethasone Sodium Phosphate Inj 100 MG/10ML: INTRAMUSCULAR | Qty: 1 | Status: AC

## 2023-02-16 NOTE — Assessment & Plan Note (Deleted)
stage IIIB p(T3, N1aM0), MSS, KRAS/NRAS/BRAF wild type, lung and node metastasis in 04/2022  -Initially diagnosed in 08/2021 -he completed 4 cycles adjuvant CAPOX 10/10/21 - 12/25/21 (though he somehow was still taking Xeloda through ~02/09/22 due to misunderstandings).  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 -we have changed his treatment to maintenance therapy with 5-fu and vectibix in late March 2024 -He is tolerating treatment very well, will continue. -due to metastasis in both lungs and retroperitoneal lymph nodes, he is probably not a candidate for surgery or local therapy

## 2023-02-16 NOTE — Progress Notes (Deleted)
[facility]   [phone] [phone]   Clinic Follow up Note   Patient Care Team: Medicine, [facility] as PCP - General Berna Bue, MD as Consulting Physician (General Surgery) Malachy Mood, MD as Consulting Physician (Oncology)  Date of Service:  02/16/2023  CHIEF COMPLAINT: f/u of metastatic colon cancer   CURRENT THERAPY:   FOLFIRI starting 05/08/22 Vectibix (added with C2); q2 weeks; changed to maintenance 5FU/leuc and vectibix 10/28/22   ASSESSMENT: *** Matthew [name] is a 57 y.o. male with   No problem-specific Assessment & Plan notes found for this encounter.  ***   PLAN:     SUMMARY OF ONCOLOGIC HISTORY: Oncology History Overview Note   Cancer Staging  Cancer of left colon [facility]) Staging form: Colon and Rectum, AJCC 8th Edition - Pathologic stage from 08/08/2021: Stage IIIB (pT3, pN1a, cM0) - Signed by Malachy Mood, MD on 08/26/2021    Cancer of left colon (HCC)  04/01/2020 Imaging   IMPRESSION: 1. Gallbladder decompressed bowel also partially calcified gallstones. No pericholecystic inflammation though if there is persisting clinical concern for cholecystitis right upper quadrant ultrasound could be obtained. 2. Circumferential thickening of the distal thoracic esophagus. Could reflect features of esophagitis. Correlate with clinical symptoms and consider endoscopy as clinically warranted. 3. Additional segmental thickening of the mid to distal sigmoid with focal narrowing. No acute surrounding inflammation or resulting obstruction. Findings are nonspecific, and could reflect sequela of prior inflammation/infection. However, recommend correlation with colonoscopy if not recently performed. 4. Mild circumferential bladder wall thickening and indentation of the bladder base by an enlarged prostate. Possibly sequela of chronic outlet obstruction though could correlate with urinalysis to exclude cystitis. 5.  Aortic Atherosclerosis (ICD10-I70.0).   08/06/2021 Imaging   IMPRESSION: Sigmoid colonic perforation with small free intraperitoneal gas and infiltration of the mesenteric and omental fat in keeping with changes of peritonitis.   Long segment inflammatory stranding of the sigmoid colon in keeping with a severe infectious or inflammatory colitis. This terminates an area of irregular mural thickening, infiltrative soft tissue within the colonic mesentery, and focal dystrophic calcification. This may represent a chronic inflammatory process, however, a perforated malignancy could appear similarly. There are 2 separate points of perforation which again raise the question of an underlying malignancy.   7.1 cm gas and fluid containing pericolonic abscess within the sigmoid mesentery.   Marked inflammatory change of the terminal ileum adjacent to the pericolonic abscess with resultant small bowel obstruction. Fluid within the distal esophagus likely relates to gastroesophageal reflux the setting of vomiting.   Aortic Atherosclerosis (ICD10-I70.0).   08/08/2021 Cancer Staging   Staging form: Colon and Rectum, AJCC 8th Edition - Pathologic stage from 08/08/2021: Stage IIIB (pT3, pN1a, cM0) - Signed by Malachy Mood, MD on 08/26/2021 Stage prefix: Initial diagnosis Total positive nodes: 1 Histologic grading system: 4 grade system Histologic grade (G): G2 Residual tumor (R): R0 - None   08/08/2021 Definitive Surgery   FINAL MICROSCOPIC DIAGNOSIS:   A. COLON, SIGMOID, PARTIAL COLECTOMY:  - Invasive moderately differentiated adenocarcinoma.  - Metastatic carcinoma involving one of twelve lymph nodes (1/12).  - See oncology table below.   ADDENDUM:  Mismatch Repair Protein (IHC)  SUMMARY INTERPRETATION: NORMAL    08/14/2021 Imaging   EXAM: CT ABDOMEN AND PELVIS WITH CONTRAST  IMPRESSION: 1. Post recent sigmoid colectomy with left lower quadrant colostomy. Two small residual foci of air within the  pelvic mesentery with mild adjacent thickening, but no abscess or  drainable collection. Trace non organized free fluid and stranding in the pelvis. 2. Short segment of small bowel wall thickening and inflammation in the pelvis involving the distal ileum, likely reactive. 3. Dilated distal esophagus, stomach, and small bowel, without discrete transition point, favoring postoperative ileus. 4. Small bilateral pleural effusions and compressive atelectasis. 5. Heterogeneous partially enhancing 14 mm lymph node in the retroperitoneum at the aortoiliac bifurcation, not significantly changed from prior exam. Suspected additional lymph nodes in the anterior common iliac space, not significantly changed from prior exam. Recommend attention at follow-up. 6. Additional chronic findings as described.     08/15/2021 Imaging   EXAM: CT CHEST WITH CONTRAST  IMPRESSION: 1. No evidence of thoracic metastasis. 2. Bilateral small layering pleural effusions with passive atelectasis   08/26/2021 Initial Diagnosis   Cancer of left colon (HCC)   10/10/2021 - 12/12/2021 Chemotherapy   Patient is on Treatment Plan : COLORECTAL Xelox (Capeox) q21d     05/28/2022 - 05/28/2022 Chemotherapy   Patient is on Treatment Plan : COLORECTAL FOLFIRI + Bevacizumab q14d     05/28/2022 -  Chemotherapy   Patient is on Treatment Plan : COLORECTAL FOLFIRI + Panitumumab q14d     07/31/2022 Imaging    IMPRESSION: Decreased bilateral pulmonary metastases.   Stable mild abdominal lymphadenopathy.   No new or progressive metastatic disease within the chest, abdomen, or pelvis.   01/18/2023 Imaging    IMPRESSION: 1. Status post Gertie Gowda pouch sigmoid colon resection with left lower quadrant end colostomy and rectal stump. Unchanged, mild wall thickening of the decompressed distal colon at the ileostomy, as well as the rectal stump. 2. Unchanged small, treated metastatic pulmonary nodules. No new nodules. 3. Unchanged  enlarged, coarsely calcified treated lower aortocaval metastatic lymph nodes. No new lymphadenopathy. 4. Coronary artery disease. 5. Cholelithiasis.      INTERVAL HISTORY: *** Matthew [name] is here for a follow up of metastatic colon cancer. He was last seen by me on 01/20/2023. He presents to the clinic.    patient and are negative.  MEDICAL HISTORY:  Past Medical History:  Diagnosis Date   Cancer (HCC)    Schizophrenia [facility])     SURGICAL HISTORY: Past Surgical History:  Procedure Laterality Date   BRONCHIAL BIOPSY  05/11/2022   Procedure: BRONCHIAL BIOPSIES;  Surgeon: Leslye Peer, MD;  Location: [facility] ENDOSCOPY;  Service: Pulmonary;;   BRONCHIAL BRUSHINGS  05/11/2022   Procedure: BRONCHIAL BRUSHINGS;  Surgeon: Leslye Peer, MD;  [facility] ENDOSCOPY;  Service: Pulmonary;;   BRONCHIAL NEEDLE ASPIRATION BIOPSY  05/11/2022   Procedure: BRONCHIAL NEEDLE ASPIRATION BIOPSIES;  Surgeon: Leslye Peer, MD;  Location: [facility] ENDOSCOPY;  Service: Pulmonary;;   BRONCHIAL WASHINGS  05/11/2022   Procedure: BRONCHIAL WASHINGS;  Surgeon: Leslye Peer, MD;  Location: [facility] ENDOSCOPY;  Service: Pulmonary;;   IR IMAGING GUIDED PORT INSERTION  05/25/2022   LAPAROTOMY N/A 08/08/2021   Procedure: EXPLORATORY LAPAROTOMY;  Surgeon: Berna Bue, MD;  Location: [facility] OR;  Service: General;  Laterality: N/A;   PARTIAL COLECTOMY N/A 08/08/2021   Procedure: PARTIAL COLECTOMY WITH COLOSTOMY;  Surgeon: Berna Bue, MD;  Location: [facility] OR;  Service: General;  Laterality: N/A;   VIDEO BRONCHOSCOPY WITH RADIAL ENDOBRONCHIAL ULTRASOUND  05/11/2022   Procedure: VIDEO BRONCHOSCOPY WITH RADIAL ENDOBRONCHIAL ULTRASOUND;  Surgeon: Leslye Peer, MD;  Location: [facility] ENDOSCOPY;  Service: Pulmonary;;    I have reviewed the social history and family history with the patient and they are unchanged from previous  note.  ALLERGIES:  has No Known Allergies.  MEDICATIONS:  Current Outpatient Medications   Medication Sig Dispense Refill   acetaminophen (TYLENOL) 500 MG tablet Take 2 tablets (1,000 mg total) by mouth every 6 (six) hours as needed for mild pain or moderate pain.  0   aspirin EC 81 MG tablet Take 81 mg by mouth daily as needed (for pain or headaches).      benztropine (COGENTIN) 1 MG tablet Take 1 mg at bedtime by mouth.     clindamycin (CLINDAGEL) 1 % gel Apply topically 2 (two) times daily. To skin rash on face and upper body 30 g 0   ferrous sulfate 325 (65 FE) MG EC tablet Take 1 tablet (325 mg total) by mouth daily. 30 tablet 3   hydrocortisone cream 1 % Apply 1 Application topically 2 (two) times daily as needed for itching. For rash 30 g 2   loperamide (IMODIUM) 2 MG capsule Take 1 capsule (2 mg total) by mouth as needed for diarrhea or loose stools. 30 capsule 0   magnesium oxide (MAG-OX) 400 (240 Mg) MG tablet Take 1 tablet (400 mg total) by mouth daily. 30 tablet 2   Multiple Vitamin (MULTIVITAMIN WITH MINERALS) TABS tablet Take 1 tablet by mouth daily.     ondansetron (ZOFRAN) 8 MG tablet Take 1 tablet (8 mg total) by mouth every 8 (eight) hours as needed for nausea or vomiting. 30 tablet 0   paliperidone (INVEGA SUSTENNA) 156 MG/ML SUSP injection Inject 156 mg every 30 (thirty) days into the muscle.     polyethylene glycol (MIRALAX / GLYCOLAX) 17 g packet Take 17 g by mouth daily as needed for mild constipation or moderate constipation.  0   potassium chloride SA (KLOR-CON M) 20 MEQ tablet Take 1 tablet (20 mEq total) by mouth daily. 30 tablet 2   prochlorperazine (COMPAZINE) 10 MG tablet TAKE 1 TABLET(10 MG) BY MOUTH EVERY 6 HOURS AS NEEDED FOR NAUSEA OR VOMITING 30 tablet 0   No current facility-administered medications for this visit.    PHYSICAL EXAMINATION: ECOG PERFORMANCE STATUS: {CHL ONC ECOG PS:[phone]}  There were no vitals filed for this visit. Wt Readings from Last 3 Encounters:  01/20/23 152 lb 11.2 oz (69.3 kg)  01/06/23 151 lb 3.2 oz (68.6 kg)   12/23/22 151 lb 12.8 oz (68.9 kg)    {Only keep what was examined. If exam not performed, can use .CEXAM } GENERAL:alert, no distress and comfortable SKIN: skin color, texture, turgor are normal, no rashes or significant lesions EYES: normal, Conjunctiva are pink and non-injected, sclera clear {OROPHARYNX:no exudate, no erythema and lips, buccal mucosa, and tongue normal}  NECK: supple, thyroid normal size, non-tender, without nodularity LYMPH:  no palpable lymphadenopathy in the cervical, axillary {or inguinal} LUNGS: clear to auscultation and percussion with normal breathing effort HEART: regular rate & rhythm and no murmurs and no lower extremity edema ABDOMEN:abdomen soft, non-tender and normal bowel sounds Musculoskeletal:no cyanosis of digits and no clubbing  NEURO: alert & oriented x 3 with fluent speech, no focal motor/sensory deficits  LABORATORY DATA:  I have reviewed the data as listed    Latest Ref Rng & Units 01/20/2023   10:00 AM 01/06/2023   10:38 AM 12/23/2022    9:15 AM  CBC  WBC 4.0 - 10.5 K/uL 4.6  4.1  5.5   Hemoglobin 13.0 - 17.0 g/dL 28.4  13.2  44.0   Hematocrit 39.0 - 52.0 % 38.0  36.3  38.7  Platelets 150 - 400 K/uL 295  292  315         Latest Ref Rng & Units 01/20/2023   10:00 AM 01/06/2023   10:38 AM 12/23/2022    9:15 AM  CMP  Glucose 70 - 99 mg/dL 829  562  130   BUN 6 - 20 mg/dL 8  11  12    Creatinine 0.61 - 1.24 mg/dL 8.65  7.84  6.96   Sodium 135 - 145 mmol/L 137  139  144   Potassium 3.5 - 5.1 mmol/L 3.4  3.4  3.5   Chloride 98 - 111 mmol/L 105  107  110   CO2 22 - 32 mmol/L 24  25  26    Calcium 8.9 - 10.3 mg/dL 9.0  9.1  9.0   Total Protein 6.5 - 8.1 g/dL 6.2  6.8  6.5   Total Bilirubin 0.3 - 1.2 mg/dL 0.9  0.8  0.5   Alkaline Phos 38 - 126 U/L 127  132  104   AST 15 - 41 U/L 17  24  14    ALT 0 - 44 U/L 31  37  13       RADIOGRAPHIC STUDIES: I have personally reviewed the radiological images as listed and agreed with the findings in  the report. No results found.    No orders of the defined types were placed in this encounter.  All questions were answered. The patient knows to call the clinic with any problems, questions or concerns. No barriers to learning was detected. The total time spent in the appointment was {CHL ONC TIME VISIT - EXBMW:[id number]}.     Salome Holmes, CMA 02/16/2023   I, Monica Martinez, CMA, am acting as scribe for Malachy Mood, MD.   {Add scribe attestation statement}

## 2023-02-17 ENCOUNTER — Other Ambulatory Visit: Payer: Self-pay

## 2023-02-17 ENCOUNTER — Inpatient Hospital Stay: Payer: MEDICAID

## 2023-02-17 ENCOUNTER — Inpatient Hospital Stay: Payer: MEDICAID | Admitting: Hematology

## 2023-02-17 ENCOUNTER — Inpatient Hospital Stay: Payer: MEDICAID | Attending: Hematology | Admitting: Hematology

## 2023-02-17 ENCOUNTER — Encounter: Payer: Self-pay | Admitting: Hematology

## 2023-02-17 VITALS — BP 108/77 | HR 85 | Temp 98.6°F | Resp 18 | Wt 154.1 lb

## 2023-02-17 DIAGNOSIS — C189 Malignant neoplasm of colon, unspecified: Secondary | ICD-10-CM

## 2023-02-17 DIAGNOSIS — F209 Schizophrenia, unspecified: Secondary | ICD-10-CM | POA: Insufficient documentation

## 2023-02-17 DIAGNOSIS — C772 Secondary and unspecified malignant neoplasm of intra-abdominal lymph nodes: Secondary | ICD-10-CM | POA: Diagnosis not present

## 2023-02-17 DIAGNOSIS — C7802 Secondary malignant neoplasm of left lung: Secondary | ICD-10-CM | POA: Diagnosis not present

## 2023-02-17 DIAGNOSIS — Z79899 Other long term (current) drug therapy: Secondary | ICD-10-CM | POA: Insufficient documentation

## 2023-02-17 DIAGNOSIS — C7801 Secondary malignant neoplasm of right lung: Secondary | ICD-10-CM | POA: Diagnosis not present

## 2023-02-17 DIAGNOSIS — C186 Malignant neoplasm of descending colon: Secondary | ICD-10-CM

## 2023-02-17 DIAGNOSIS — Z5111 Encounter for antineoplastic chemotherapy: Secondary | ICD-10-CM | POA: Diagnosis present

## 2023-02-17 DIAGNOSIS — Z95828 Presence of other vascular implants and grafts: Secondary | ICD-10-CM

## 2023-02-17 DIAGNOSIS — Z5112 Encounter for antineoplastic immunotherapy: Secondary | ICD-10-CM | POA: Diagnosis present

## 2023-02-17 LAB — CBC WITH DIFFERENTIAL (CANCER CENTER ONLY)
Abs Immature Granulocytes: 0.01 10*3/uL (ref 0.00–0.07)
Basophils Absolute: 0 10*3/uL (ref 0.0–0.1)
Basophils Relative: 1 %
Eosinophils Absolute: 0.1 10*3/uL (ref 0.0–0.5)
Eosinophils Relative: 2 %
HCT: 36.9 % — ABNORMAL LOW (ref 39.0–52.0)
Hemoglobin: 12.4 g/dL — ABNORMAL LOW (ref 13.0–17.0)
Immature Granulocytes: 0 %
Lymphocytes Relative: 26 %
Lymphs Abs: 1.4 10*3/uL (ref 0.7–4.0)
MCH: 29.2 pg (ref 26.0–34.0)
MCHC: 33.6 g/dL (ref 30.0–36.0)
MCV: 86.8 fL (ref 80.0–100.0)
Monocytes Absolute: 0.5 10*3/uL (ref 0.1–1.0)
Monocytes Relative: 9 %
Neutro Abs: 3.3 10*3/uL (ref 1.7–7.7)
Neutrophils Relative %: 62 %
Platelet Count: 252 10*3/uL (ref 150–400)
RBC: 4.25 MIL/uL (ref 4.22–5.81)
RDW: 15.7 % — ABNORMAL HIGH (ref 11.5–15.5)
WBC Count: 5.3 10*3/uL (ref 4.0–10.5)
nRBC: 0 % (ref 0.0–0.2)

## 2023-02-17 LAB — CMP (CANCER CENTER ONLY)
ALT: 25 U/L (ref 0–44)
AST: 25 U/L (ref 15–41)
Albumin: 3.8 g/dL (ref 3.5–5.0)
Alkaline Phosphatase: 109 U/L (ref 38–126)
Anion gap: 7 (ref 5–15)
BUN: 8 mg/dL (ref 6–20)
CO2: 24 mmol/L (ref 22–32)
Calcium: 9.1 mg/dL (ref 8.9–10.3)
Chloride: 109 mmol/L (ref 98–111)
Creatinine: 0.77 mg/dL (ref 0.61–1.24)
GFR, Estimated: >60 mL/min (ref >60)
Glucose, Bld: 83 mg/dL (ref 70–99)
Potassium: 3.5 mmol/L (ref 3.5–5.1)
Sodium: 140 mmol/L (ref 135–145)
Total Bilirubin: 0.6 mg/dL (ref 0.3–1.2)
Total Protein: 6.5 g/dL (ref 6.5–8.1)

## 2023-02-17 LAB — MAGNESIUM: Magnesium: 1.7 mg/dL (ref 1.7–2.4)

## 2023-02-17 MED ORDER — SODIUM CHLORIDE 0.9% FLUSH
10.0000 mL | Freq: Once | INTRAVENOUS | Status: AC
  Administered 2023-02-17: 10 mL

## 2023-02-17 MED ORDER — HEPARIN SOD (PORK) LOCK FLUSH 100 UNIT/ML IV SOLN
500.0000 [IU] | Freq: Once | INTRAVENOUS | Status: AC
  Administered 2023-02-17: 500 [IU]

## 2023-02-17 NOTE — Progress Notes (Signed)
[facility]   [phone] [phone]   Clinic Follow up Note   Patient Care Team: Medicine, [facility] as PCP - General Berna Bue, MD as Consulting Physician (General Surgery) Malachy Mood, MD as Consulting Physician (Oncology)  Date of Service:  02/17/2023  CHIEF COMPLAINT: f/u of  metastatic colon cancer     CURRENT THERAPY:  FOLFIRI starting 05/08/22 Vectibix (added with C2); q2 weeks; changed to maintenance 5FU/leuc and vectibix 10/28/22   ASSESSMENT:  Matthew [name] is a 57 y.o. 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 -we have changed his treatment to maintenance therapy with 5-fu and vectibix in late March 2024 -He is tolerating treatment very well, will continue. -Due to metastasis in both lungs and retroperitoneal lymph nodes, he is probably not a candidate for consolidation radiation -he is tolerating treatment very well, he missed the last treatment 2 weeks ago due to test and his family. -He was very late for the appointment today, chemo rescheduled to tomorrow.     Schizophrenia [facility]) he was diagnosed with schizophrenia in his 20's, managed with medication, he is on disability. -he lives with his mother; he is able to do ADLs for himself, but not iADLS. They are both on disability. His mother also seems to have some kind of mental  impairment         PLAN: -lab reviewed - plan to Repeat CT scan September -will continue Vectibix/Leuco/5FU every 2 weeks, next treatment tomorrow 7/18  -lab/flush and fu in 03/04/2023       SUMMARY OF ONCOLOGIC HISTORY: Oncology History Overview Note   Cancer Staging  Cancer of left colon [facility]) Staging form: Colon and Rectum, AJCC 8th Edition - Pathologic stage from 08/08/2021: Stage IIIB (pT3, pN1a, cM0) - Signed by Malachy Mood, MD on 08/26/2021    Cancer of left colon (HCC)  04/01/2020 Imaging   IMPRESSION: 1. Gallbladder decompressed bowel also partially calcified gallstones. No pericholecystic inflammation though if there is persisting clinical concern for cholecystitis right upper quadrant ultrasound could be obtained. 2. Circumferential thickening of the distal thoracic esophagus. Could reflect features of esophagitis. Correlate with clinical symptoms and consider endoscopy as clinically warranted. 3. Additional segmental thickening of the mid to distal sigmoid with focal narrowing. No acute surrounding inflammation or resulting obstruction. Findings are nonspecific, and could reflect sequela of prior inflammation/infection. However, recommend correlation with colonoscopy if not recently performed. 4. Mild circumferential bladder wall thickening and indentation of the bladder base by an enlarged prostate. Possibly sequela of chronic outlet obstruction though could correlate with urinalysis to exclude cystitis. 5. Aortic Atherosclerosis (ICD10-I70.0).   08/06/2021 Imaging   IMPRESSION: Sigmoid colonic perforation with small free intraperitoneal gas and infiltration of the mesenteric and omental fat in keeping with changes of peritonitis.   Long segment inflammatory stranding of the sigmoid colon in keeping with a severe infectious or inflammatory colitis. This terminates an area of irregular mural thickening,  infiltrative soft tissue within the colonic mesentery, and  focal dystrophic calcification. This may represent a chronic inflammatory process, however, a perforated malignancy could appear similarly. There are 2 separate points of perforation which again raise the question of an underlying malignancy.   7.1 cm gas and fluid containing pericolonic abscess within the sigmoid mesentery.   Marked inflammatory change of the terminal ileum adjacent to the pericolonic abscess with resultant small bowel obstruction. Fluid within the distal esophagus likely relates to gastroesophageal reflux the setting of vomiting.   Aortic Atherosclerosis (ICD10-I70.0).   08/08/2021 Cancer Staging   Staging form: Colon and Rectum, AJCC 8th Edition - Pathologic stage from 08/08/2021: Stage IIIB (pT3, pN1a, cM0) - Signed by Malachy Mood, MD on 08/26/2021 Stage prefix: Initial diagnosis Total positive nodes: 1 Histologic grading system: 4 grade system Histologic grade (G): G2 Residual tumor (R): R0 - None   08/08/2021 Definitive Surgery   FINAL MICROSCOPIC DIAGNOSIS:   A. COLON, SIGMOID, PARTIAL COLECTOMY:  - Invasive moderately differentiated adenocarcinoma.  - Metastatic carcinoma involving one of twelve lymph nodes (1/12).  - See oncology table below.   ADDENDUM:  Mismatch Repair Protein (I[facility])  SUMMARY INTERPRETATION: NORMAL    08/14/2021 Imaging   EXAM: CT ABDOMEN AND PELVIS WITH CONTRAST  IMPRESSION: 1. Post recent sigmoid colectomy with left lower quadrant colostomy. Two small residual foci of air within the pelvic mesentery with mild adjacent thickening, but no abscess or drainable collection. Trace non organized free fluid and stranding in the pelvis. 2. Short segment of small bowel wall thickening and inflammation in the pelvis involving the distal ileum, likely reactive. 3. Dilated distal esophagus, stomach, and small bowel, without discrete transition point, favoring postoperative ileus. 4. Small bilateral pleural effusions and compressive atelectasis. 5.  Heterogeneous partially enhancing 14 mm lymph node in the retroperitoneum at the aortoiliac bifurcation, not significantly changed from prior exam. Suspected additional lymph nodes in the anterior common iliac space, not significantly changed from prior exam. Recommend attention at follow-up. 6. Additional chronic findings as described.     08/15/2021 Imaging   EXAM: CT CHEST WITH CONTRAST  IMPRESSION: 1. No evidence of thoracic metastasis. 2. Bilateral small layering pleural effusions with passive atelectasis   08/26/2021 Initial Diagnosis   Cancer of left colon (HCC)   10/10/2021 - 12/12/2021 Chemotherapy   Patient is on Treatment Plan : COLORECTAL Xelox (Capeox) q21d     05/28/2022 - 05/28/2022 Chemotherapy   Patient is on Treatment Plan : COLORECTAL FOLFIRI + Bevacizumab q14d     05/28/2022 -  Chemotherapy   Patient is on Treatment Plan : COLORECTAL FOLFIRI + Panitumumab q14d     07/31/2022 Imaging    IMPRESSION: Decreased bilateral pulmonary metastases.   Stable mild abdominal lymphadenopathy.   No new or progressive metastatic disease within the chest, abdomen, or pelvis.   01/18/2023 Imaging    IMPRESSION: 1. Status post Gertie Gowda pouch sigmoid colon resection with left lower quadrant end colostomy and rectal stump. Unchanged, mild wall thickening of the decompressed distal colon at the ileostomy, as well as the rectal stump. 2. Unchanged small, treated metastatic pulmonary nodules. No new nodules. 3. Unchanged enlarged, coarsely calcified treated lower aortocaval metastatic lymph nodes. No new lymphadenopathy. 4. Coronary artery disease. 5. Cholelithiasis.      INTERVAL HISTORY:  Matthew [name] is here for a follow up of  metastatic colon cancer . He was last seen by me on 01/20/2023. He presents to the clinic accompanied by mother and cousin. Pt denies  having any issues from last treatment.     All other systems were reviewed with the patient and are  negative.  MEDICAL HISTORY:  Past Medical History:  Diagnosis Date   Cancer (HCC)    Schizophrenia [facility])     SURGICAL HISTORY: Past Surgical History:  Procedure Laterality Date   BRONCHIAL BIOPSY  05/11/2022   Procedure: BRONCHIAL BIOPSIES;  Surgeon: Leslye Peer, MD;  Location: [facility] ENDOSCOPY;  Service: Pulmonary;;   BRONCHIAL BRUSHINGS  05/11/2022   Procedure: BRONCHIAL BRUSHINGS;  Surgeon: Leslye Peer, MD;  Location: [facility] ENDOSCOPY;  Service: Pulmonary;;   BRONCHIAL NEEDLE ASPIRATION BIOPSY  05/11/2022   Procedure: BRONCHIAL NEEDLE ASPIRATION BIOPSIES;  Surgeon: Leslye Peer, MD;  Location: [facility] ENDOSCOPY;  Service: Pulmonary;;   BRONCHIAL WASHINGS  05/11/2022   Procedure: BRONCHIAL WASHINGS;  Surgeon: Leslye Peer, MD;  Location: [facility] ENDOSCOPY;  Service: Pulmonary;;   IR IMAGING GUIDED PORT INSERTION  05/25/2022   LAPAROTOMY N/A 08/08/2021   Procedure: EXPLORATORY LAPAROTOMY;  Surgeon: Berna Bue, MD;  Location: [facility] OR;  Service: General;  Laterality: N/A;   PARTIAL COLECTOMY N/A 08/08/2021   Procedure: PARTIAL COLECTOMY WITH COLOSTOMY;  Surgeon: Berna Bue, MD;  Location: [facility] OR;  Service: General;  Laterality: N/A;   VIDEO BRONCHOSCOPY WITH RADIAL ENDOBRONCHIAL ULTRASOUND  05/11/2022   Procedure: VIDEO BRONCHOSCOPY WITH RADIAL ENDOBRONCHIAL ULTRASOUND;  Surgeon: Leslye Peer, MD;  Location: [facility] ENDOSCOPY;  Service: Pulmonary;;    I have reviewed the social history and family history with the patient and they are unchanged from previous note.  ALLERGIES:  has No Known Allergies.  MEDICATIONS:  Current Outpatient Medications  Medication Sig Dispense Refill   acetaminophen (TYLENOL) 500 MG tablet Take 2 tablets (1,000 mg total) by mouth every 6 (six) hours as needed for mild pain or moderate pain.  0   aspirin EC 81 MG tablet Take 81 mg by mouth daily as needed (for pain or headaches).      benztropine (COGENTIN) 1 MG tablet Take 1 mg at bedtime by mouth.      clindamycin (CLINDAGEL) 1 % gel Apply topically 2 (two) times daily. To skin rash on face and upper body 30 g 0   ferrous sulfate 325 (65 FE) MG EC tablet Take 1 tablet (325 mg total) by mouth daily. 30 tablet 3   hydrocortisone cream 1 % Apply 1 Application topically 2 (two) times daily as needed for itching. For rash 30 g 2   loperamide (IMODIUM) 2 MG capsule Take 1 capsule (2 mg total) by mouth as needed for diarrhea or loose stools. 30 capsule 0   magnesium oxide (MAG-OX) 400 (240 Mg) MG tablet Take 1 tablet (400 mg total) by mouth daily. 30 tablet 2   Multiple Vitamin (MULTIVITAMIN WITH MINERALS) TABS tablet Take 1 tablet by mouth daily.     ondansetron (ZOFRAN) 8 MG tablet Take 1 tablet (8 mg total) by mouth every 8 (eight) hours as needed for nausea or vomiting. 30 tablet 0   paliperidone (INVEGA SUSTENNA) 156 MG/ML SUSP injection Inject 156 mg every 30 (thirty) days into the muscle.     polyethylene glycol (MIRALAX / GLYCOLAX) 17 g packet Take 17 g by mouth daily as needed for mild constipation or moderate constipation.  0   potassium chloride SA (KLOR-CON M) 20 MEQ tablet Take 1 tablet (20 mEq total) by mouth daily. 30 tablet 2   prochlorperazine (COMPAZINE) 10 MG tablet TAKE 1 TABLET(10 MG) BY  MOUTH EVERY 6 HOURS AS NEEDED FOR NAUSEA OR VOMITING 30 tablet 0   No current facility-administered medications for this visit.    PHYSICAL EXAMINATION: ECOG PERFORMANCE STATUS: 0 - Asymptomatic  Vitals:   02/17/23 1326  BP: 108/77  Pulse: 85  Resp: 18  Temp: 98.6 F (37 C)  SpO2: 100%   Wt Readings from Last 3 Encounters:  02/17/23 154 lb 1.6 oz (69.9 kg)  01/20/23 152 lb 11.2 oz (69.3 kg)  01/06/23 151 lb 3.2 oz (68.6 kg)     GENERAL:alert, no distress and comfortable SKIN: skin color normal, no rashes or significant lesions EYES: normal, Conjunctiva are pink and non-injected, sclera clear  NEURO: alert & oriented x 3 with fluent speech   LABORATORY DATA:  I have reviewed the  data as listed    Latest Ref Rng & Units 02/17/2023   12:58 PM 01/20/2023   10:00 AM 01/06/2023   10:38 AM  CBC  WBC 4.0 - 10.5 K/uL 5.3  4.6  4.1   Hemoglobin 13.0 - 17.0 g/dL 16.1  09.6  04.5   Hematocrit 39.0 - 52.0 % 36.9  38.0  36.3   Platelets 150 - 400 K/uL 252  295  292         Latest Ref Rng & Units 02/17/2023   12:58 PM 01/20/2023   10:00 AM 01/06/2023   10:38 AM  CMP  Glucose 70 - 99 mg/dL 83  409  811   BUN 6 - 20 mg/dL 8  8  11    Creatinine 0.61 - 1.24 mg/dL 9.14  7.82  9.56   Sodium 135 - 145 mmol/L 140  137  139   Potassium 3.5 - 5.1 mmol/L 3.5  3.4  3.4   Chloride 98 - 111 mmol/L 109  105  107   CO2 22 - 32 mmol/L 24  24  25    Calcium 8.9 - 10.3 mg/dL 9.1  9.0  9.1   Total Protein 6.5 - 8.1 g/dL 6.5  6.2  6.8   Total Bilirubin 0.3 - 1.2 mg/dL 0.6  0.9  0.8   Alkaline Phos 38 - 126 U/L 109  127  132   AST 15 - 41 U/L 25  17  24    ALT 0 - 44 U/L 25  31  37       RADIOGRAPHIC STUDIES: I have personally reviewed the radiological images as listed and agreed with the findings in the report. No results found.    Orders Placed This Encounter  Procedures   CBC with Differential (Cancer Center Only)    Standing Status:   Future    Standing Expiration Date:   03/03/2024   CMP (Cancer Center only)    Standing Status:   Future    Standing Expiration Date:   03/03/2024   Magnesium    Standing Status:   Future    Standing Expiration Date:   03/03/2024   CBC with Differential (Cancer Center Only)    Standing Status:   Future    Standing Expiration Date:   03/17/2024   CMP (Cancer Center only)    Standing Status:   Future    Standing Expiration Date:   03/17/2024   Magnesium    Standing Status:   Future    Standing Expiration Date:   03/17/2024   CBC with Differential (Cancer Center Only)    Standing Status:   Future    Standing Expiration Date:   03/31/2024   CMP (  Cancer Center only)    Standing Status:   Future    Standing Expiration Date:   03/31/2024   Magnesium     Standing Status:   Future    Standing Expiration Date:   03/31/2024   CBC with Differential (Cancer Center Only)    Standing Status:   Future    Standing Expiration Date:   04/14/2024   CMP (Cancer Center only)    Standing Status:   Future    Standing Expiration Date:   04/14/2024   Magnesium    Standing Status:   Future    Standing Expiration Date:   04/14/2024   All questions were answered. The patient knows to call the clinic with any problems, questions or concerns. No barriers to learning was detected. The total time spent in the appointment was 25 minutes.     Malachy Mood, MD 02/17/2023   Carolin Coy, CMA, am acting as scribe for Malachy Mood, MD.   I have reviewed the above documentation for accuracy and completeness, and I agree with the above.

## 2023-02-18 ENCOUNTER — Other Ambulatory Visit: Payer: Self-pay

## 2023-02-18 ENCOUNTER — Inpatient Hospital Stay: Payer: MEDICAID

## 2023-02-18 ENCOUNTER — Telehealth: Payer: Self-pay

## 2023-02-18 VITALS — HR 90 | Temp 98.2°F | Resp 18 | Wt 154.2 lb

## 2023-02-18 DIAGNOSIS — C186 Malignant neoplasm of descending colon: Secondary | ICD-10-CM | POA: Diagnosis not present

## 2023-02-18 MED ORDER — SODIUM CHLORIDE 0.9 % IV SOLN
10.0000 mg | Freq: Once | INTRAVENOUS | Status: AC
  Administered 2023-02-18: 10 mg via INTRAVENOUS
  Filled 2023-02-18: qty 10

## 2023-02-18 MED ORDER — SODIUM CHLORIDE 0.9% FLUSH: 10.0000 mL | INTRAVENOUS | Status: DC | PRN

## 2023-02-18 MED ORDER — SODIUM CHLORIDE 0.9 % IV SOLN: Freq: Once | INTRAVENOUS | Status: AC

## 2023-02-18 MED ORDER — HEPARIN SOD (PORK) LOCK FLUSH 100 UNIT/ML IV SOLN: 500.0000 [IU] | Freq: Once | INTRAVENOUS | Status: DC | PRN

## 2023-02-18 MED ORDER — SODIUM CHLORIDE 0.9 % IV SOLN
400.0000 mg/m2 | Freq: Once | INTRAVENOUS | Status: AC
  Administered 2023-02-18: 728 mg via INTRAVENOUS
  Filled 2023-02-18: qty 25

## 2023-02-18 MED ORDER — PALONOSETRON HCL INJECTION 0.25 MG/5ML
0.2500 mg | Freq: Once | INTRAVENOUS | Status: AC
  Administered 2023-02-18: 0.25 mg via INTRAVENOUS
  Filled 2023-02-18: qty 5

## 2023-02-18 MED ORDER — SODIUM CHLORIDE 0.9 % IV SOLN
6.0000 mg/kg | Freq: Once | INTRAVENOUS | Status: AC
  Administered 2023-02-18: 400 mg via INTRAVENOUS
  Filled 2023-02-18: qty 20

## 2023-02-18 MED ORDER — SODIUM CHLORIDE 0.9 % IV SOLN
2400.0000 mg/m2 | INTRAVENOUS | Status: DC
  Administered 2023-02-18: 4350 mg via INTRAVENOUS
  Filled 2023-02-18: qty 87

## 2023-02-18 NOTE — Patient Instructions (Signed)
[facility]  Discharge Instructions: Thank you for choosing [facility] to provide your oncology and hematology care.   If you have a lab appointment with the Cancer Center, please go directly to the Cancer Center and check in at the registration area.   Wear comfortable clothing and clothing appropriate for easy access to any Portacath or PICC line.   We strive to give you quality time with your provider. You may need to reschedule your appointment if you arrive late (15 or more minutes).  Arriving late affects you and other patients whose appointments are after yours.  Also, if you miss three or more appointments without notifying the office, you may be dismissed from the clinic at the provider's discretion.      For prescription refill requests, have your pharmacy contact our office and allow 72 hours for refills to be completed.    Today you received the following chemotherapy and/or immunotherapy agents :  Vectibix, Leucovorin, Fluorourocil.      To help prevent nausea and vomiting after your treatment, we encourage you to take your nausea medication as directed.  BELOW ARE SYMPTOMS THAT SHOULD BE REPORTED IMMEDIATELY: *FEVER GREATER THAN 100.4 F (38 C) OR HIGHER *CHILLS OR SWEATING *NAUSEA AND VOMITING THAT IS NOT CONTROLLED WITH YOUR NAUSEA MEDICATION *UNUSUAL SHORTNESS OF BREATH *UNUSUAL BRUISING OR BLEEDING *URINARY PROBLEMS (pain or burning when urinating, or frequent urination) *BOWEL PROBLEMS (unusual diarrhea, constipation, pain near the anus) TENDERNESS IN MOUTH AND THROAT WITH OR WITHOUT PRESENCE OF ULCERS (sore throat, sores in mouth, or a toothache) UNUSUAL RASH, SWELLING OR PAIN  UNUSUAL VAGINAL DISCHARGE OR ITCHING   Items with * indicate a potential emergency and should be followed up as soon as possible or go to the Emergency Department if any problems should occur.  Please show the CHEMOTHERAPY ALERT CARD or  IMMUNOTHERAPY ALERT CARD at check-in to the Emergency Department and triage nurse.  Should you have questions after your visit or need to cancel or reschedule your appointment, please contact  [facility]  Dept: [phone]  and follow the prompts.  Office hours are 8:00 a.m. to 4:30 p.m. Monday - Friday. Please note that voicemails left after 4:00 p.m. may not be returned until the following business day.  We are closed weekends and major holidays. You have access to a nurse at all times for urgent questions. Please call the main number to the clinic Dept: [phone] and follow the prompts.   For any non-urgent questions, you may also contact your provider using MyChart. We now offer e-Visits for anyone 69 and older to request care online for non-urgent symptoms. For details visit [email or web address].   Also download the MyChart app! Go to the app store, search "MyChart", open the app, select , and log in with your MyChart username and password.

## 2023-02-18 NOTE — Telephone Encounter (Signed)
Spoke with pt's mom Mattie to remind her that pt has an appt today and that hospital transportation [employer]) will be at their house around 11-11:30am to pick them up.  Mattie stated "OK, they will be ready".  Call ended.

## 2023-02-19 ENCOUNTER — Other Ambulatory Visit: Payer: Self-pay

## 2023-02-20 ENCOUNTER — Inpatient Hospital Stay: Payer: MEDICAID

## 2023-02-20 ENCOUNTER — Other Ambulatory Visit: Payer: Self-pay

## 2023-02-20 VITALS — BP 99/75 | HR 100 | Temp 97.3°F | Resp 17

## 2023-02-20 DIAGNOSIS — C186 Malignant neoplasm of descending colon: Secondary | ICD-10-CM

## 2023-02-20 MED ORDER — SODIUM CHLORIDE 0.9% FLUSH
10.0000 mL | INTRAVENOUS | Status: DC | PRN
  Administered 2023-02-20: 10 mL

## 2023-02-20 MED ORDER — HEPARIN SOD (PORK) LOCK FLUSH 100 UNIT/ML IV SOLN
500.0000 [IU] | Freq: Once | INTRAVENOUS | Status: AC | PRN
  Administered 2023-02-20: 500 [IU]

## 2023-02-22 ENCOUNTER — Other Ambulatory Visit: Payer: Self-pay

## 2023-03-03 ENCOUNTER — Telehealth: Payer: Self-pay

## 2023-03-03 MED FILL — Dexamethasone Sodium Phosphate Inj 100 MG/10ML: INTRAMUSCULAR | Qty: 1 | Status: AC

## 2023-03-03 NOTE — Telephone Encounter (Signed)
Spoke with pt's mom Mattie to remind her of the pt's appt on 03/04/2023 at 8:30am.  Stated that hospital transportation will be there between 7:30am and 8am on 03/04/2023.  Pt's mother verbalized understanding.

## 2023-03-03 NOTE — Assessment & Plan Note (Signed)
stage IIIB p(T3, N1aM0), MSS, KRAS/NRAS/BRAF wild type, lung and node metastasis in 04/2022  -Initially diagnosed in 08/2021 -he completed 4 cycles adjuvant CAPOX 10/10/21 - 12/25/21 (though he somehow was still taking Xeloda through ~02/09/22 due to misunderstandings).  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 -we have changed his treatment to maintenance therapy with 5-fu and vectibix in late March 2024 -He is tolerating treatment very well, will continue. -due to metastasis in both lungs and retroperitoneal lymph nodes, he is probably not a candidate for surgery or local therapy

## 2023-03-03 NOTE — Assessment & Plan Note (Signed)
he was diagnosed with schizophrenia in his 20's, managed with medication, he is on disability. -he lives with his mother; he is able to do ADLs for himself, but not iADLS. They are both on disability. His mother also seems to have some kind of mental impairment 

## 2023-03-04 ENCOUNTER — Inpatient Hospital Stay: Payer: MEDICAID

## 2023-03-04 ENCOUNTER — Inpatient Hospital Stay: Payer: MEDICAID | Attending: Hematology

## 2023-03-04 ENCOUNTER — Other Ambulatory Visit: Payer: Self-pay

## 2023-03-04 ENCOUNTER — Encounter: Payer: Self-pay | Admitting: Hematology

## 2023-03-04 ENCOUNTER — Inpatient Hospital Stay (HOSPITAL_BASED_OUTPATIENT_CLINIC_OR_DEPARTMENT_OTHER): Payer: MEDICAID | Admitting: Hematology

## 2023-03-04 VITALS — BP 103/77 | HR 87 | Temp 98.4°F | Resp 16 | Ht 69.0 in | Wt 153.6 lb

## 2023-03-04 DIAGNOSIS — C186 Malignant neoplasm of descending colon: Secondary | ICD-10-CM | POA: Insufficient documentation

## 2023-03-04 DIAGNOSIS — C7802 Secondary malignant neoplasm of left lung: Secondary | ICD-10-CM | POA: Diagnosis not present

## 2023-03-04 DIAGNOSIS — Z95828 Presence of other vascular implants and grafts: Secondary | ICD-10-CM

## 2023-03-04 DIAGNOSIS — F209 Schizophrenia, unspecified: Secondary | ICD-10-CM

## 2023-03-04 DIAGNOSIS — C772 Secondary and unspecified malignant neoplasm of intra-abdominal lymph nodes: Secondary | ICD-10-CM | POA: Diagnosis not present

## 2023-03-04 DIAGNOSIS — Z5112 Encounter for antineoplastic immunotherapy: Secondary | ICD-10-CM | POA: Insufficient documentation

## 2023-03-04 DIAGNOSIS — Z79899 Other long term (current) drug therapy: Secondary | ICD-10-CM | POA: Insufficient documentation

## 2023-03-04 DIAGNOSIS — C7801 Secondary malignant neoplasm of right lung: Secondary | ICD-10-CM | POA: Diagnosis not present

## 2023-03-04 DIAGNOSIS — Z5111 Encounter for antineoplastic chemotherapy: Secondary | ICD-10-CM | POA: Insufficient documentation

## 2023-03-04 DIAGNOSIS — C189 Malignant neoplasm of colon, unspecified: Secondary | ICD-10-CM

## 2023-03-04 LAB — CBC WITH DIFFERENTIAL (CANCER CENTER ONLY)
Abs Immature Granulocytes: 0.01 10*3/uL (ref 0.00–0.07)
Basophils Absolute: 0 10*3/uL (ref 0.0–0.1)
Basophils Relative: 1 %
Eosinophils Absolute: 0.2 10*3/uL (ref 0.0–0.5)
Eosinophils Relative: 5 %
HCT: 36.7 % — ABNORMAL LOW (ref 39.0–52.0)
Hemoglobin: 12.2 g/dL — ABNORMAL LOW (ref 13.0–17.0)
Immature Granulocytes: 0 %
Lymphocytes Relative: 27 %
Lymphs Abs: 1.1 10*3/uL (ref 0.7–4.0)
MCH: 28.6 pg (ref 26.0–34.0)
MCHC: 33.2 g/dL (ref 30.0–36.0)
MCV: 86.2 fL (ref 80.0–100.0)
Monocytes Absolute: 0.4 10*3/uL (ref 0.1–1.0)
Monocytes Relative: 9 %
Neutro Abs: 2.4 10*3/uL (ref 1.7–7.7)
Neutrophils Relative %: 58 %
Platelet Count: 298 10*3/uL (ref 150–400)
RBC: 4.26 MIL/uL (ref 4.22–5.81)
RDW: 15.8 % — ABNORMAL HIGH (ref 11.5–15.5)
WBC Count: 4.1 10*3/uL (ref 4.0–10.5)
nRBC: 0 % (ref 0.0–0.2)

## 2023-03-04 LAB — CMP (CANCER CENTER ONLY)
ALT: 14 U/L (ref 0–44)
AST: 15 U/L (ref 15–41)
Albumin: 3.8 g/dL (ref 3.5–5.0)
Alkaline Phosphatase: 90 U/L (ref 38–126)
Anion gap: 7 (ref 5–15)
BUN: 10 mg/dL (ref 6–20)
CO2: 26 mmol/L (ref 22–32)
Calcium: 8.9 mg/dL (ref 8.9–10.3)
Chloride: 109 mmol/L (ref 98–111)
Creatinine: 0.75 mg/dL (ref 0.61–1.24)
GFR, Estimated: >60 mL/min (ref >60)
Glucose, Bld: 107 mg/dL — ABNORMAL HIGH (ref 70–99)
Potassium: 3.7 mmol/L (ref 3.5–5.1)
Sodium: 142 mmol/L (ref 135–145)
Total Bilirubin: 0.6 mg/dL (ref 0.3–1.2)
Total Protein: 6.3 g/dL — ABNORMAL LOW (ref 6.5–8.1)

## 2023-03-04 LAB — MAGNESIUM: Magnesium: 1.8 mg/dL (ref 1.7–2.4)

## 2023-03-04 MED ORDER — SODIUM CHLORIDE 0.9 % IV SOLN: Freq: Once | INTRAVENOUS | Status: AC

## 2023-03-04 MED ORDER — SODIUM CHLORIDE 0.9 % IV SOLN
6.0000 mg/kg | Freq: Once | INTRAVENOUS | Status: AC
  Administered 2023-03-04: 400 mg via INTRAVENOUS
  Filled 2023-03-04: qty 20

## 2023-03-04 MED ORDER — SODIUM CHLORIDE 0.9 % IV SOLN
2400.0000 mg/m2 | INTRAVENOUS | Status: DC
  Administered 2023-03-04: 4350 mg via INTRAVENOUS
  Filled 2023-03-04: qty 87

## 2023-03-04 MED ORDER — SODIUM CHLORIDE 0.9 % IV SOLN
10.0000 mg | Freq: Once | INTRAVENOUS | Status: AC
  Administered 2023-03-04: 10 mg via INTRAVENOUS
  Filled 2023-03-04: qty 10

## 2023-03-04 MED ORDER — SODIUM CHLORIDE 0.9 % IV SOLN
400.0000 mg/m2 | Freq: Once | INTRAVENOUS | Status: AC
  Administered 2023-03-04: 728 mg via INTRAVENOUS
  Filled 2023-03-04: qty 25

## 2023-03-04 MED ORDER — SODIUM CHLORIDE 0.9% FLUSH: 10.0000 mL | INTRAVENOUS | Status: DC | PRN

## 2023-03-04 MED ORDER — SODIUM CHLORIDE 0.9% FLUSH
10.0000 mL | Freq: Once | INTRAVENOUS | Status: AC
  Administered 2023-03-04: 10 mL

## 2023-03-04 MED ORDER — PALONOSETRON HCL INJECTION 0.25 MG/5ML
0.2500 mg | Freq: Once | INTRAVENOUS | Status: AC
  Administered 2023-03-04: 0.25 mg via INTRAVENOUS
  Filled 2023-03-04: qty 5

## 2023-03-04 NOTE — Patient Instructions (Signed)
[facility]  Discharge Instructions: Thank you for choosing [facility] to provide your oncology and hematology care.   If you have a lab appointment with the Cancer Center, please go directly to the Cancer Center and check in at the registration area.   Wear comfortable clothing and clothing appropriate for easy access to any Portacath or PICC line.   We strive to give you quality time with your provider. You may need to reschedule your appointment if you arrive late (15 or more minutes).  Arriving late affects you and other patients whose appointments are after yours.  Also, if you miss three or more appointments without notifying the office, you may be dismissed from the clinic at the provider's discretion.      For prescription refill requests, have your pharmacy contact our office and allow 72 hours for refills to be completed.    Today you received the following chemotherapy and/or immunotherapy agents :  Vectibix, Leucovorin, Fluorourocil.      To help prevent nausea and vomiting after your treatment, we encourage you to take your nausea medication as directed.  BELOW ARE SYMPTOMS THAT SHOULD BE REPORTED IMMEDIATELY: *FEVER GREATER THAN 100.4 F (38 C) OR HIGHER *CHILLS OR SWEATING *NAUSEA AND VOMITING THAT IS NOT CONTROLLED WITH YOUR NAUSEA MEDICATION *UNUSUAL SHORTNESS OF BREATH *UNUSUAL BRUISING OR BLEEDING *URINARY PROBLEMS (pain or burning when urinating, or frequent urination) *BOWEL PROBLEMS (unusual diarrhea, constipation, pain near the anus) TENDERNESS IN MOUTH AND THROAT WITH OR WITHOUT PRESENCE OF ULCERS (sore throat, sores in mouth, or a toothache) UNUSUAL RASH, SWELLING OR PAIN  UNUSUAL VAGINAL DISCHARGE OR ITCHING   Items with * indicate a potential emergency and should be followed up as soon as possible or go to the Emergency Department if any problems should occur.  Please show the CHEMOTHERAPY ALERT CARD or  IMMUNOTHERAPY ALERT CARD at check-in to the Emergency Department and triage nurse.  Should you have questions after your visit or need to cancel or reschedule your appointment, please contact  [facility]  Dept: [phone]  and follow the prompts.  Office hours are 8:00 a.m. to 4:30 p.m. Monday - Friday. Please note that voicemails left after 4:00 p.m. may not be returned until the following business day.  We are closed weekends and major holidays. You have access to a nurse at all times for urgent questions. Please call the main number to the clinic Dept: [phone] and follow the prompts.   For any non-urgent questions, you may also contact your provider using MyChart. We now offer e-Visits for anyone 69 and older to request care online for non-urgent symptoms. For details visit [email or web address].   Also download the MyChart app! Go to the app store, search "MyChart", open the app, select , and log in with your MyChart username and password.

## 2023-03-04 NOTE — Progress Notes (Signed)
[facility]   [phone] [phone]   Clinic Follow up Note   Patient Care Team: Medicine, [facility] as PCP - General Berna Bue, MD as Consulting Physician (General Surgery) Malachy Mood, MD as Consulting Physician (Oncology)  Date of Service:  03/04/2023  CURRENT THERAPY:  FOLFIRI starting 05/08/22 Vectibix (added with C2); q2 weeks; changed to maintenance 5FU/leuc and vectibix 10/28/22    ASSESSMENT:  Matthew [name] is a 57 y.o. male with   ASSESSMENT:  Matthew [name] is a 57 y.o. 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 -we have changed his treatment to maintenance therapy with 5-fu and vectibix in late March 2024 -He is tolerating treatment very well, will continue. -due to metastasis in both lungs and retroperitoneal lymph nodes, he is probably not a candidate for surgery or local therapy   Schizophrenia [facility]) he was diagnosed with schizophrenia in his 20's, managed with medication, he is on disability. -he lives with his mother; he is able to do ADLs for himself, but not iADLS. They are both on disability. His mother also seems to have some kind of mental impairment      PLAN: - reviewed labs - proceed to treatment - f/u with labs and office visit in 2 weeks     SUMMARY OF ONCOLOGIC HISTORY: Oncology History Overview Note   Cancer  Staging  Cancer of left colon [facility]) Staging form: Colon and Rectum, AJCC 8th Edition - Pathologic stage from 08/08/2021: Stage IIIB (pT3, pN1a, cM0) - Signed by Malachy Mood, MD on 08/26/2021    Cancer of left colon (HCC)  04/01/2020 Imaging   IMPRESSION: 1. Gallbladder decompressed bowel also partially calcified gallstones. No pericholecystic inflammation though if there is persisting clinical concern for cholecystitis right upper quadrant ultrasound could be obtained. 2. Circumferential thickening of the distal thoracic esophagus. Could reflect features of esophagitis. Correlate with clinical symptoms and consider endoscopy as clinically warranted. 3. Additional segmental thickening of the mid to distal sigmoid with focal narrowing. No acute surrounding inflammation or resulting obstruction. Findings are nonspecific, and could reflect sequela of prior inflammation/infection. However, recommend correlation with colonoscopy if not recently performed. 4. Mild circumferential bladder wall thickening and indentation of the bladder base by an enlarged prostate. Possibly sequela of chronic outlet obstruction though could correlate with urinalysis to exclude cystitis. 5. Aortic Atherosclerosis (ICD10-I70.0).   08/06/2021 Imaging   IMPRESSION: Sigmoid colonic perforation with small free intraperitoneal gas and infiltration of the mesenteric and omental fat in keeping with changes of peritonitis.   Long segment inflammatory stranding of the sigmoid colon in keeping with a severe infectious or inflammatory colitis. This terminates an area of irregular mural thickening, infiltrative soft tissue within the colonic mesentery, and focal dystrophic calcification. This may represent a chronic inflammatory process, however, a perforated malignancy could appear similarly. There are 2 separate points of perforation which again raise the question of an underlying malignancy.   7.1 cm  gas and fluid containing  pericolonic abscess within the sigmoid mesentery.   Marked inflammatory change of the terminal ileum adjacent to the pericolonic abscess with resultant small bowel obstruction. Fluid within the distal esophagus likely relates to gastroesophageal reflux the setting of vomiting.   Aortic Atherosclerosis (ICD10-I70.0).   08/08/2021 Cancer Staging   Staging form: Colon and Rectum, AJCC 8th Edition - Pathologic stage from 08/08/2021: Stage IIIB (pT3, pN1a, cM0) - Signed by Malachy Mood, MD on 08/26/2021 Stage prefix: Initial diagnosis Total positive nodes: 1 Histologic grading system: 4 grade system Histologic grade (G): G2 Residual tumor (R): R0 - None   08/08/2021 Definitive Surgery   FINAL MICROSCOPIC DIAGNOSIS:   A. COLON, SIGMOID, PARTIAL COLECTOMY:  - Invasive moderately differentiated adenocarcinoma.  - Metastatic carcinoma involving one of twelve lymph nodes (1/12).  - See oncology table below.   ADDENDUM:  Mismatch Repair Protein (IHC)  SUMMARY INTERPRETATION: NORMAL    08/14/2021 Imaging   EXAM: CT ABDOMEN AND PELVIS WITH CONTRAST  IMPRESSION: 1. Post recent sigmoid colectomy with left lower quadrant colostomy. Two small residual foci of air within the pelvic mesentery with mild adjacent thickening, but no abscess or drainable collection. Trace non organized free fluid and stranding in the pelvis. 2. Short segment of small bowel wall thickening and inflammation in the pelvis involving the distal ileum, likely reactive. 3. Dilated distal esophagus, stomach, and small bowel, without discrete transition point, favoring postoperative ileus. 4. Small bilateral pleural effusions and compressive atelectasis. 5. Heterogeneous partially enhancing 14 mm lymph node in the retroperitoneum at the aortoiliac bifurcation, not significantly changed from prior exam. Suspected additional lymph nodes in the anterior common iliac space, not significantly changed from prior exam. Recommend  attention at follow-up. 6. Additional chronic findings as described.     08/15/2021 Imaging   EXAM: CT CHEST WITH CONTRAST  IMPRESSION: 1. No evidence of thoracic metastasis. 2. Bilateral small layering pleural effusions with passive atelectasis   08/26/2021 Initial Diagnosis   Cancer of left colon (HCC)   10/10/2021 - 12/12/2021 Chemotherapy   Patient is on Treatment Plan : COLORECTAL Xelox (Capeox) q21d     05/28/2022 - 05/28/2022 Chemotherapy   Patient is on Treatment Plan : COLORECTAL FOLFIRI + Bevacizumab q14d     05/28/2022 -  Chemotherapy   Patient is on Treatment Plan : COLORECTAL FOLFIRI + Panitumumab q14d     07/31/2022 Imaging    IMPRESSION: Decreased bilateral pulmonary metastases.   Stable mild abdominal lymphadenopathy.   No new or progressive metastatic disease within the chest, abdomen, or pelvis.   01/18/2023 Imaging    IMPRESSION: 1. Status post Gertie Gowda pouch sigmoid colon resection with left lower quadrant end colostomy and rectal stump. Unchanged, mild wall thickening of the decompressed distal colon at the ileostomy, as well as the rectal stump. 2. Unchanged small, treated metastatic pulmonary nodules. No new nodules. 3. Unchanged enlarged, coarsely calcified treated lower aortocaval metastatic lymph nodes. No new lymphadenopathy. 4. Coronary artery disease. 5. Cholelithiasis.      INTERVAL HISTORY:  Matthew [name] is here for a follow up of   metastatic colon cancer . He was last seen by me on 02/17/2023. He presents to the clinic accompanied by his mother. Patient feeling well overall. Rash has improved. Examined patient ostomy he sees ostomy nurse tomorrow.      All other systems were reviewed with the patient and are negative.  MEDICAL HISTORY:  Past Medical History:  Diagnosis Date   Cancer (HCC)  Schizophrenia [facility])     SURGICAL HISTORY: Past Surgical History:  Procedure Laterality Date   BRONCHIAL BIOPSY  05/11/2022    Procedure: BRONCHIAL BIOPSIES;  Surgeon: Leslye Peer, MD;  Location: [facility] ENDOSCOPY;  Service: Pulmonary;;   BRONCHIAL BRUSHINGS  05/11/2022   Procedure: BRONCHIAL BRUSHINGS;  Surgeon: Leslye Peer, MD;  Location: [facility] ENDOSCOPY;  Service: Pulmonary;;   BRONCHIAL NEEDLE ASPIRATION BIOPSY  05/11/2022   Procedure: BRONCHIAL NEEDLE ASPIRATION BIOPSIES;  Surgeon: Leslye Peer, MD;  Location: [facility] ENDOSCOPY;  Service: Pulmonary;;   BRONCHIAL WASHINGS  05/11/2022   Procedure: BRONCHIAL WASHINGS;  Surgeon: Leslye Peer, MD;  Location: [facility] ENDOSCOPY;  Service: Pulmonary;;   IR IMAGING GUIDED PORT INSERTION  05/25/2022   LAPAROTOMY N/A 08/08/2021   Procedure: EXPLORATORY LAPAROTOMY;  Surgeon: Berna Bue, MD;  Location: [facility] OR;  Service: General;  Laterality: N/A;   PARTIAL COLECTOMY N/A 08/08/2021   Procedure: PARTIAL COLECTOMY WITH COLOSTOMY;  Surgeon: Berna Bue, MD;  Location: [facility] OR;  Service: General;  Laterality: N/A;   VIDEO BRONCHOSCOPY WITH RADIAL ENDOBRONCHIAL ULTRASOUND  05/11/2022   Procedure: VIDEO BRONCHOSCOPY WITH RADIAL ENDOBRONCHIAL ULTRASOUND;  Surgeon: Leslye Peer, MD;  Location: [facility] ENDOSCOPY;  Service: Pulmonary;;    I have reviewed the social history and family history with the patient and they are unchanged from previous note.  ALLERGIES:  has No Known Allergies.  MEDICATIONS:  Current Outpatient Medications  Medication Sig Dispense Refill   acetaminophen (TYLENOL) 500 MG tablet Take 2 tablets (1,000 mg total) by mouth every 6 (six) hours as needed for mild pain or moderate pain.  0   aspirin EC 81 MG tablet Take 81 mg by mouth daily as needed (for pain or headaches).      benztropine (COGENTIN) 1 MG tablet Take 1 mg at bedtime by mouth.     clindamycin (CLINDAGEL) 1 % gel Apply topically 2 (two) times daily. To skin rash on face and upper body 30 g 0   ferrous sulfate 325 (65 FE) MG EC tablet Take 1 tablet (325 mg total) by mouth daily. 30 tablet 3    hydrocortisone cream 1 % Apply 1 Application topically 2 (two) times daily as needed for itching. For rash 30 g 2   loperamide (IMODIUM) 2 MG capsule Take 1 capsule (2 mg total) by mouth as needed for diarrhea or loose stools. 30 capsule 0   magnesium oxide (MAG-OX) 400 (240 Mg) MG tablet Take 1 tablet (400 mg total) by mouth daily. 30 tablet 2   Multiple Vitamin (MULTIVITAMIN WITH MINERALS) TABS tablet Take 1 tablet by mouth daily.     ondansetron (ZOFRAN) 8 MG tablet Take 1 tablet (8 mg total) by mouth every 8 (eight) hours as needed for nausea or vomiting. 30 tablet 0   paliperidone (INVEGA SUSTENNA) 156 MG/ML SUSP injection Inject 156 mg every 30 (thirty) days into the muscle.     polyethylene glycol (MIRALAX / GLYCOLAX) 17 g packet Take 17 g by mouth daily as needed for mild constipation or moderate constipation.  0   potassium chloride SA (KLOR-CON M) 20 MEQ tablet Take 1 tablet (20 mEq total) by mouth daily. 30 tablet 2   prochlorperazine (COMPAZINE) 10 MG tablet TAKE 1 TABLET(10 MG) BY MOUTH EVERY 6 HOURS AS NEEDED FOR NAUSEA OR VOMITING 30 tablet 0   No current facility-administered medications for this visit.   Facility-Administered Medications Ordered in Other Visits  Medication Dose Route Frequency Provider Last Rate  Last Admin   fluorouracil (ADRUCIL) 4,350 mg in sodium chloride 0.9 % 63 mL chemo infusion  2,400 mg/m2 (Treatment Plan Recorded) Intravenous 1 day or 1 dose Malachy Mood, MD   Infusion Verify at 03/04/23 1215   sodium chloride flush (NS) 0.9 % injection 10 mL  10 mL Intracatheter PRN Malachy Mood, MD        PHYSICAL EXAMINATION: ECOG PERFORMANCE STATUS: 0 - Asymptomatic  Vitals:   03/04/23 0903  BP: 103/77  Pulse: 87  Resp: 16  Temp: 98.4 F (36.9 C)  SpO2: 98%   Wt Readings from Last 3 Encounters:  03/04/23 153 lb 9.6 oz (69.7 kg)  02/18/23 154 lb 4 oz (70 kg)  02/17/23 154 lb 1.6 oz (69.9 kg)     GENERAL:alert, no distress and comfortable SKIN: skin color,  texture, turgor are normal, no rashes or significant lesions EYES: normal, Conjunctiva are pink and non-injected, sclera clear NECK: supple, thyroid normal size, non-tender, without nodularity LYMPH:  no palpable lymphadenopathy in the cervical, axillary  LUNGS: clear to auscultation and percussion with normal breathing effort HEART: regular rate & rhythm and no murmurs and no lower extremity edema ABDOMEN:abdomen soft, non-tender and normal bowel sounds Musculoskeletal:no cyanosis of digits and no clubbing  NEURO: alert & oriented x 3 with fluent speech, no focal motor/sensory deficits  LABORATORY DATA:  I have reviewed the data as listed    Latest Ref Rng & Units 03/04/2023    8:08 AM 02/17/2023   12:58 PM 01/20/2023   10:00 AM  CBC  WBC 4.0 - 10.5 K/uL 4.1  5.3  4.6   Hemoglobin 13.0 - 17.0 g/dL 16.1  09.6  04.5   Hematocrit 39.0 - 52.0 % 36.7  36.9  38.0   Platelets 150 - 400 K/uL 298  252  295         Latest Ref Rng & Units 03/04/2023    8:08 AM 02/17/2023   12:58 PM 01/20/2023   10:00 AM  CMP  Glucose 70 - 99 mg/dL 409  83  811   BUN 6 - 20 mg/dL 10  8  8    Creatinine 0.61 - 1.24 mg/dL 9.14  7.82  9.56   Sodium 135 - 145 mmol/L 142  140  137   Potassium 3.5 - 5.1 mmol/L 3.7  3.5  3.4   Chloride 98 - 111 mmol/L 109  109  105   CO2 22 - 32 mmol/L 26  24  24    Calcium 8.9 - 10.3 mg/dL 8.9  9.1  9.0   Total Protein 6.5 - 8.1 g/dL 6.3  6.5  6.2   Total Bilirubin 0.3 - 1.2 mg/dL 0.6  0.6  0.9   Alkaline Phos 38 - 126 U/L 90  109  127   AST 15 - 41 U/L 15  25  17    ALT 0 - 44 U/L 14  25  31        RADIOGRAPHIC STUDIES: I have personally reviewed the radiological images as listed and agreed with the findings in the report. No results found.    No orders of the defined types were placed in this encounter.  All questions were answered. The patient knows to call the clinic with any problems, questions or concerns. No barriers to learning was detected. The total time spent in  the appointment was 25 minutes.     Malachy Mood, MD 03/04/2023   I, Sharlette Dense, CMA, am acting as scribe for Malachy Mood,  MD.   I have reviewed the above documentation for accuracy and completeness, and I agree with the above.

## 2023-03-05 ENCOUNTER — Telehealth: Payer: Self-pay

## 2023-03-05 ENCOUNTER — Other Ambulatory Visit: Payer: Self-pay

## 2023-03-05 NOTE — Telephone Encounter (Signed)
Spoke with pt's mom Matthew [name] to remind her that pt has an appt on 03/06/2023 and that hospital transportation will pick up pt & mom between 9am & 9:30am.  Matthew [name] verbalized understanding and had no further questions or concerns.

## 2023-03-06 ENCOUNTER — Inpatient Hospital Stay: Payer: MEDICAID

## 2023-03-06 ENCOUNTER — Other Ambulatory Visit: Payer: Self-pay

## 2023-03-06 VITALS — BP 111/82 | HR 106 | Temp 97.5°F | Resp 16

## 2023-03-06 DIAGNOSIS — Z5111 Encounter for antineoplastic chemotherapy: Secondary | ICD-10-CM | POA: Diagnosis not present

## 2023-03-06 DIAGNOSIS — C186 Malignant neoplasm of descending colon: Secondary | ICD-10-CM

## 2023-03-06 MED ORDER — SODIUM CHLORIDE 0.9% FLUSH
10.0000 mL | INTRAVENOUS | Status: DC | PRN
  Administered 2023-03-06: 10 mL

## 2023-03-06 MED ORDER — HEPARIN SOD (PORK) LOCK FLUSH 100 UNIT/ML IV SOLN
500.0000 [IU] | Freq: Once | INTRAVENOUS | Status: AC | PRN
  Administered 2023-03-06: 500 [IU]

## 2023-03-17 MED FILL — Dexamethasone Sodium Phosphate Inj 100 MG/10ML: INTRAMUSCULAR | Qty: 1 | Status: AC

## 2023-03-17 NOTE — Progress Notes (Addendum)
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 -we have changed his treatment to maintenance therapy with 5-fu and vectibix in late March 2024 -He is tolerating treatment very well, will continue. -due to metastasis in both lungs and retroperitoneal lymph nodes, he is probably not a candidate for surgery or local therapy  03/18/2023 - continues to do well. Will hold 5-fu pump for this cycle as patient will have transportation issues over the weekend and cannot make appointment to have pump discontinued.  --the patient has no concerns or complaints today. Appetite is good. He has no nausea, vomiting, diarrhea, or constipation. Denies abdominal pain. He has had a 3 pound weight loss.  Lab review -WBC 4.6; Hgb 12.5; Hct 38.1; Plt 3.2; ANC 3.1 -Mag 1.8; K 3.4; normal LFTs, normal renal function. -labs/flush, follow up, and infusion in 2 weeks as scheduled.   Plan: Continue with maintenance chemotherapy.  Will hold 5-fu pump for this cycle as patient will have transportation issues over the weekend and cannot make appointment to have pump  discontinued.  Lab review -WBC 4.6; Hgb 12.5; Hct 38.1; Plt 3.2; ANC 3.1 -Mag 1.8; K 3.4; normal LFTs, normal renal function. -labs/flush, follow up, and infusion in 2 weeks as scheduled. -due for new CT chest, abdomen, and pelvis in September 2024.   The patient understands the plans discussed today and is in agreement with them.  He knows to contact our office if he develops concerns prior to his next appointment.  I provided 30 minutes of face-to-face time during this encounter and > 50% was spent counseling as documented under my assessment and plan.    Carlean Jews, NP  [facility] [address] [place] [address] Dept: [phone] Dept Fax: [phone]   Orders Placed This Encounter  Procedures   CBC with Differential (Cancer Center Only)    Standing Status:   Future    Standing Expiration Date:   04/28/2024   CMP (Cancer Center only)    Standing Status:   Future    Standing Expiration Date:   04/28/2024   Magnesium    Standing Status:   Future    Standing Expiration Date:   04/28/2024   CBC with Differential (Cancer Center Only)    Standing Status:   Future    Standing Expiration Date:   05/12/2024   CMP (Cancer Center only)    Standing Status:   Future    Standing Expiration Date:   05/12/2024   Magnesium    Standing Status:   Future    Standing Expiration Date:   05/12/2024      CHIEF COMPLAINT:  CC: left colon  cancer   Current Treatment: maintenance therapy with 5-fu and vectibix every 14 days   INTERVAL HISTORY:  Matthew [name] is here today for repeat clinical assessment. He denies fevers or chills. He denies pain. His appetite is good. His weight has decreased 3 pounds over last 2 weeks .  Metastatic left colon cancer  Currently on maintenance chemotherapy with 5-fu and Vectibix every 14 days.  Not surgical candidate due to lung metastases and metastases to retroperitoneal lymph  nodes.  Continues to do well. Will hold 5-fu pump for this cycle as patient will have transportation issues over the weekend and cannot make appointment to have pump discontinued.  He has no concerns or complaints today. Appetite is good. He has no nausea, vomiting, diarrhea, or constipation. Denies abdominal pain. He has had a 3 pound weight loss.  Lab review -WBC 4.6; Hgb 12.5; Hct 38.1; Plt 3.2; ANC 3.1 -Mag 1.8; K 3.4; normal LFTs, normal renal function. -labs/flush, follow up, and infusion in 2 weeks as scheduled.  I have reviewed the past medical history, past surgical history, social history and family history with the patient and they are unchanged from previous note.  ALLERGIES:  has No Known Allergies.  MEDICATIONS:  Current Outpatient Medications  Medication Sig Dispense Refill   acetaminophen (TYLENOL) 500 MG tablet Take 2 tablets (1,000 mg total) by mouth every 6 (six) hours as needed for mild pain or moderate pain.  0   aspirin EC 81 MG tablet Take 81 mg by mouth daily as needed (for pain or headaches).      benztropine (COGENTIN) 1 MG tablet Take 1 mg at bedtime by mouth.     clindamycin (CLINDAGEL) 1 % gel Apply topically 2 (two) times daily. To skin rash on face and upper body 30 g 0   ferrous sulfate 325 (65 FE) MG EC tablet Take 1 tablet (325 mg total) by mouth daily. 30 tablet 3   hydrocortisone cream 1 % Apply 1 Application topically 2 (two) times daily as needed for itching. For rash 30 g 2   loperamide (IMODIUM) 2 MG capsule Take 1 capsule (2 mg total) by mouth as needed for diarrhea or loose stools. 30 capsule 0   magnesium oxide (MAG-OX) 400 (240 Mg) MG tablet Take 1 tablet (400 mg total) by mouth daily. 30 tablet 2   Multiple Vitamin (MULTIVITAMIN WITH MINERALS) TABS tablet Take 1 tablet by mouth daily.     ondansetron (ZOFRAN) 8 MG tablet Take 1 tablet (8 mg total) by mouth every 8 (eight) hours as needed for nausea or vomiting. 30 tablet 0   paliperidone (INVEGA  SUSTENNA) 156 MG/ML SUSP injection Inject 156 mg every 30 (thirty) days into the muscle.     polyethylene glycol (MIRALAX / GLYCOLAX) 17 g packet Take 17 g by mouth daily as needed for mild constipation or moderate constipation.  0   potassium chloride SA (KLOR-CON M) 20 MEQ tablet Take 1 tablet (20 mEq total) by mouth daily. 30 tablet 2   prochlorperazine (COMPAZINE) 10 MG tablet TAKE 1 TABLET(10 MG) BY MOUTH EVERY 6 HOURS AS NEEDED FOR NAUSEA OR VOMITING 30 tablet 0   No current facility-administered medications for this visit.   Facility-Administered Medications Ordered in Other Visits  Medication Dose Route Frequency Provider Last Rate Last Admin   sodium chloride flush (NS) 0.9 % injection 10 mL  10 mL Intracatheter PRN Malachy Mood, MD   10 mL at 03/18/23 1304    HISTORY OF PRESENT ILLNESS:  Oncology History Overview Note   Cancer Staging  Cancer of left colon [facility]) Staging form: Colon and Rectum, AJCC 8th Edition - Pathologic stage from 08/08/2021: Stage IIIB (pT3, pN1a, cM0) - Signed by Malachy Mood, MD on 08/26/2021    Cancer of left colon (HCC)  04/01/2020 Imaging   IMPRESSION: 1. Gallbladder decompressed bowel also partially calcified gallstones. No pericholecystic inflammation though if there is persisting clinical concern for cholecystitis right upper quadrant ultrasound could be obtained. 2. Circumferential thickening of the distal thoracic esophagus. Could reflect features of esophagitis. Correlate with clinical symptoms and consider endoscopy as clinically warranted. 3. Additional segmental thickening of the mid to distal sigmoid with focal narrowing. No acute surrounding inflammation or resulting obstruction. Findings are nonspecific, and could reflect sequela of prior inflammation/infection. However, recommend correlation with colonoscopy if not recently performed. 4. Mild circumferential bladder wall thickening and indentation of the bladder base by an enlarged  prostate. Possibly sequela of chronic outlet obstruction though could correlate with urinalysis to exclude cystitis. 5. Aortic Atherosclerosis (ICD10-I70.0).   08/06/2021 Imaging   IMPRESSION: Sigmoid colonic perforation with small free intraperitoneal gas and infiltration of the mesenteric and omental fat in keeping with changes of peritonitis.   Long segment inflammatory stranding of the sigmoid colon in keeping with a severe infectious or inflammatory colitis. This terminates an area of irregular mural thickening, infiltrative soft tissue within the colonic mesentery, and focal dystrophic calcification. This may represent a chronic inflammatory process, however, a perforated malignancy could appear similarly. There are 2 separate points of perforation which again raise the question of an underlying malignancy.   7.1 cm gas and fluid containing pericolonic abscess within the sigmoid mesentery.   Marked inflammatory change of the terminal ileum adjacent to the pericolonic abscess with resultant small bowel obstruction. Fluid within the distal esophagus likely relates to gastroesophageal reflux the setting of vomiting.   Aortic Atherosclerosis (ICD10-I70.0).   08/08/2021 Cancer Staging   Staging form: Colon and Rectum, AJCC 8th Edition - Pathologic stage from 08/08/2021: Stage IIIB (pT3, pN1a, cM0) - Signed by Malachy Mood, MD on 08/26/2021 Stage prefix: Initial diagnosis Total positive nodes: 1 Histologic grading system: 4 grade system Histologic grade (G): G2 Residual tumor (R): R0 - None   08/08/2021 Definitive Surgery   FINAL MICROSCOPIC DIAGNOSIS:   A. COLON, SIGMOID, PARTIAL COLECTOMY:  - Invasive moderately differentiated adenocarcinoma.  - Metastatic carcinoma involving one of twelve lymph nodes (1/12).  - See oncology table below.   ADDENDUM:  Mismatch Repair Protein (IHC)  SUMMARY INTERPRETATION: NORMAL    08/14/2021 Imaging   EXAM: CT ABDOMEN AND PELVIS WITH  CONTRAST  IMPRESSION: 1. Post recent sigmoid colectomy with left lower quadrant colostomy. Two small residual foci of air within the pelvic mesentery with mild adjacent thickening, but no abscess or drainable collection. Trace non organized free fluid and stranding in the pelvis. 2. Short segment of small bowel wall thickening and inflammation in the pelvis involving the distal ileum, likely reactive. 3. Dilated distal esophagus, stomach, and small bowel, without discrete transition point, favoring postoperative ileus. 4. Small bilateral pleural effusions and compressive atelectasis. 5. Heterogeneous partially enhancing 14 mm lymph node in the retroperitoneum at the aortoiliac bifurcation, not significantly changed from prior exam. Suspected additional lymph nodes in the anterior common iliac space, not significantly changed from prior exam. Recommend attention at follow-up. 6. Additional chronic findings as described.     08/15/2021 Imaging   EXAM: CT CHEST WITH CONTRAST  IMPRESSION: 1. No evidence  of thoracic metastasis. 2. Bilateral small layering pleural effusions with passive atelectasis   08/26/2021 Initial Diagnosis   Cancer of left colon (HCC)   10/10/2021 - 12/12/2021 Chemotherapy   Patient is on Treatment Plan : COLORECTAL Xelox ([place]apeox) q21d     05/28/2022 - 05/28/2022 Chemotherapy   Patient is on Treatment Plan : COLORECTAL FOLFIRI + Bevacizumab q14d     05/28/2022 -  Chemotherapy   Patient is on Treatment Plan : COLORECTAL FOLFIRI + Panitumumab q14d     07/31/2022 Imaging    IMPRESSION: Decreased bilateral pulmonary metastases.   Stable mild abdominal lymphadenopathy.   No new or progressive metastatic disease within the chest, abdomen, or pelvis.   01/18/2023 Imaging    IMPRESSION: 1. Status post Gertie Gowda pouch sigmoid colon resection with left lower quadrant end colostomy and rectal stump. Unchanged, mild wall thickening of the decompressed distal colon  at the ileostomy, as well as the rectal stump. 2. Unchanged small, treated metastatic pulmonary nodules. No new nodules. 3. Unchanged enlarged, coarsely calcified treated lower aortocaval metastatic lymph nodes. No new lymphadenopathy. 4. Coronary artery disease. 5. Cholelithiasis.       REVIEW OF SYSTEMS:   Constitutional: Denies fevers, chills or abnormal weight loss Eyes: Denies blurriness of vision Ears, nose, mouth, throat, and face: Denies mucositis or sore throat Respiratory: Denies cough, dyspnea or wheezes Cardiovascular: Denies palpitation, chest discomfort or lower extremity swelling Gastrointestinal:  Denies nausea, heartburn or change in bowel habits Skin: Denies abnormal skin rashes Lymphatics: Denies new lymphadenopathy or easy bruising Neurological:Denies numbness, tingling or new weaknesses Behavioral/Psych: Mood is stable, no new changes  All other systems were reviewed with the patient and are negative.   Today's Vitals   03/18/23 1049 03/18/23 1102  BP: 105/67   Pulse: 94   Resp: 18   Temp: (!) 97.3 F (36.3 C)   SpO2: 96%   Weight: 150 lb 9.6 oz (68.3 kg)   PainSc:  0-No pain   Body mass index is 22.24 kg/m.   Wt Readings from Last 3 Encounters:  03/18/23 150 lb 9.6 oz (68.3 kg)  03/04/23 153 lb 9.6 oz (69.7 kg)  02/18/23 154 lb 4 oz (70 kg)    Body mass index is 22.24 kg/m.  Performance status (ECOG): 1 - Symptomatic but completely ambulatory  PHYSICAL EXAM:   GENERAL:alert, no distress and comfortable SKIN: skin color, texture, turgor are normal, no rashes or significant lesions EYES: normal, Conjunctiva are pink and non-injected, sclera clear OROPHARYNX:no exudate, no erythema and lips, buccal mucosa, and tongue normal  NECK: supple, thyroid normal size, non-tender, without nodularity LYMPH:  no palpable lymphadenopathy in the cervical, axillary or inguinal LUNGS: clear to auscultation and percussion with normal breathing  effort HEART: regular rate & rhythm and no murmurs and no lower extremity edema ABDOMEN:abdomen soft, non-tender and normal bowel sounds Musculoskeletal:no cyanosis of digits and no clubbing  NEURO: alert & oriented x 3 with fluent speech, no focal motor/sensory deficits  LABORATORY DATA:  I have reviewed the data as listed    Component Value Date/Time   NA 138 03/18/2023 1003   K 3.4 (L) 03/18/2023 1003   CL 106 03/18/2023 1003   CO2 23 03/18/2023 1003   GLUCOSE 126 (H) 03/18/2023 1003   BUN 9 03/18/2023 1003   CREATININE 0.80 03/18/2023 1003   CALCIUM 9.0 03/18/2023 1003   PROT 6.5 03/18/2023 1003   ALBUMIN 3.9 03/18/2023 1003   AST 15 03/18/2023 1003   ALT 11 03/18/2023  1003   ALKPHOS 86 03/18/2023 1003   BILITOT 0.8 03/18/2023 1003   GFRNONAA >60 03/18/2023 1003   GFRAA >60 04/01/2020 1237     Lab Results  Component Value Date   WBC 4.6 03/18/2023   NEUTROABS 3.1 03/18/2023   HGB 12.5 (L) 03/18/2023   HCT 38.1 (L) 03/18/2023   MCV 84.3 03/18/2023   PLT 302 03/18/2023   Addendum I have seen the patient, examined him. I agree with the assessment and and plan and have edited the notes.   Patient is here for scheduled for chemotherapy, he is tolerating treatment very well, skin rash is mild, well-managed.  No other new concerns.  Lab reviewed, will proceed with treatment.  Since he is tolerating chemotherapy well, will plan to continue, next restaging scan in late September.  All questions were answered.  I spent a total of 25 minutes for his visit today, more than 50% time on face-to-face counseling.  Malachy Mood MD 8.15/2024

## 2023-03-17 NOTE — Assessment & Plan Note (Signed)
stage IIIB p(T3, N1aM0), MSS, KRAS/NRAS/BRAF wild type, lung and node metastasis in 04/2022  -Initially diagnosed in 08/2021 -he completed 4 cycles adjuvant CAPOX 10/10/21 - 12/25/21 (though he somehow was still taking Xeloda through ~02/09/22 due to misunderstandings).  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 -we have changed his treatment to maintenance therapy with 5-fu and vectibix in late March 2024 -He is tolerating treatment very well, will continue. -due to metastasis in both lungs and retroperitoneal lymph nodes, he is probably not a candidate for surgery or local therapy  03/18/2023 - continues to do well. Will hold 5-fu pump for this cycle as patient will have transportation issues over the weekend and cannot make appointment to have pump discontinued.  --the patient has no concerns or complaints today. Appetite is good. He has no nausea, vomiting, diarrhea, or constipation. Denies abdominal pain. He has had a 3 pound weight loss.  Lab review -WBC 4.6; Hgb 12.5; Hct 38.1; Plt 3.2; ANC 3.1 -Mag 1.8; K 3.4; normal LFTs, normal renal function. -labs/flush, follow up, and infusion in 2 weeks as scheduled.

## 2023-03-18 ENCOUNTER — Inpatient Hospital Stay: Payer: MEDICAID | Admitting: Nurse Practitioner

## 2023-03-18 ENCOUNTER — Inpatient Hospital Stay: Payer: MEDICAID

## 2023-03-18 ENCOUNTER — Other Ambulatory Visit: Payer: Self-pay

## 2023-03-18 VITALS — BP 105/67 | HR 94 | Temp 97.3°F | Resp 18 | Wt 150.6 lb

## 2023-03-18 VITALS — BP 112/84 | HR 86

## 2023-03-18 DIAGNOSIS — Z95828 Presence of other vascular implants and grafts: Secondary | ICD-10-CM

## 2023-03-18 DIAGNOSIS — C186 Malignant neoplasm of descending colon: Secondary | ICD-10-CM

## 2023-03-18 DIAGNOSIS — Z5111 Encounter for antineoplastic chemotherapy: Secondary | ICD-10-CM | POA: Diagnosis not present

## 2023-03-18 DIAGNOSIS — C189 Malignant neoplasm of colon, unspecified: Secondary | ICD-10-CM

## 2023-03-18 LAB — CBC WITH DIFFERENTIAL (CANCER CENTER ONLY)
Abs Immature Granulocytes: 0.02 10*3/uL (ref 0.00–0.07)
Basophils Absolute: 0 10*3/uL (ref 0.0–0.1)
Basophils Relative: 1 %
Eosinophils Absolute: 0.1 10*3/uL (ref 0.0–0.5)
Eosinophils Relative: 3 %
HCT: 38.1 % — ABNORMAL LOW (ref 39.0–52.0)
Hemoglobin: 12.5 g/dL — ABNORMAL LOW (ref 13.0–17.0)
Immature Granulocytes: 0 %
Lymphocytes Relative: 20 %
Lymphs Abs: 0.9 10*3/uL (ref 0.7–4.0)
MCH: 27.7 pg (ref 26.0–34.0)
MCHC: 32.8 g/dL (ref 30.0–36.0)
MCV: 84.3 fL (ref 80.0–100.0)
Monocytes Absolute: 0.4 10*3/uL (ref 0.1–1.0)
Monocytes Relative: 8 %
Neutro Abs: 3.1 10*3/uL (ref 1.7–7.7)
Neutrophils Relative %: 68 %
Platelet Count: 302 10*3/uL (ref 150–400)
RBC: 4.52 MIL/uL (ref 4.22–5.81)
RDW: 16.1 % — ABNORMAL HIGH (ref 11.5–15.5)
WBC Count: 4.6 10*3/uL (ref 4.0–10.5)
nRBC: 0 % (ref 0.0–0.2)

## 2023-03-18 LAB — MAGNESIUM: Magnesium: 1.8 mg/dL (ref 1.7–2.4)

## 2023-03-18 LAB — CMP (CANCER CENTER ONLY)
ALT: 11 U/L (ref 0–44)
AST: 15 U/L (ref 15–41)
Albumin: 3.9 g/dL (ref 3.5–5.0)
Alkaline Phosphatase: 86 U/L (ref 38–126)
Anion gap: 9 (ref 5–15)
BUN: 9 mg/dL (ref 6–20)
CO2: 23 mmol/L (ref 22–32)
Calcium: 9 mg/dL (ref 8.9–10.3)
Chloride: 106 mmol/L (ref 98–111)
Creatinine: 0.8 mg/dL (ref 0.61–1.24)
GFR, Estimated: >60 mL/min (ref >60)
Glucose, Bld: 126 mg/dL — ABNORMAL HIGH (ref 70–99)
Potassium: 3.4 mmol/L — ABNORMAL LOW (ref 3.5–5.1)
Sodium: 138 mmol/L (ref 135–145)
Total Bilirubin: 0.8 mg/dL (ref 0.3–1.2)
Total Protein: 6.5 g/dL (ref 6.5–8.1)

## 2023-03-18 MED ORDER — SODIUM CHLORIDE 0.9 % IV SOLN
6.0000 mg/kg | Freq: Once | INTRAVENOUS | Status: AC
  Administered 2023-03-18: 400 mg via INTRAVENOUS
  Filled 2023-03-18: qty 20

## 2023-03-18 MED ORDER — SODIUM CHLORIDE 0.9 % IV SOLN: Freq: Once | INTRAVENOUS | Status: AC

## 2023-03-18 MED ORDER — SODIUM CHLORIDE 0.9% FLUSH
10.0000 mL | Freq: Once | INTRAVENOUS | Status: AC
  Administered 2023-03-18: 10 mL

## 2023-03-18 MED ORDER — SODIUM CHLORIDE 0.9 % IV SOLN
10.0000 mg | Freq: Once | INTRAVENOUS | Status: DC
  Filled 2023-03-18: qty 1

## 2023-03-18 MED ORDER — SODIUM CHLORIDE 0.9% FLUSH
10.0000 mL | INTRAVENOUS | Status: DC | PRN
  Administered 2023-03-18: 10 mL

## 2023-03-18 MED ORDER — PALONOSETRON HCL INJECTION 0.25 MG/5ML: 0.2500 mg | Freq: Once | INTRAVENOUS | Status: DC

## 2023-03-18 MED ORDER — HEPARIN SOD (PORK) LOCK FLUSH 100 UNIT/ML IV SOLN
500.0000 [IU] | Freq: Once | INTRAVENOUS | Status: AC | PRN
  Administered 2023-03-18: 500 [IU]

## 2023-03-18 NOTE — Patient Instructions (Signed)
[facility]  Discharge Instructions: Thank you for choosing [facility] to provide your oncology and hematology care.   If you have a lab appointment with the Cancer Center, please go directly to the Cancer Center and check in at the registration area.   Wear comfortable clothing and clothing appropriate for easy access to any Portacath or PICC line.   We strive to give you quality time with your provider. You may need to reschedule your appointment if you arrive late (15 or more minutes).  Arriving late affects you and other patients whose appointments are after yours.  Also, if you miss three or more appointments without notifying the office, you may be dismissed from the clinic at the provider's discretion.      For prescription refill requests, have your pharmacy contact our office and allow 72 hours for refills to be completed.    Today you received the following chemotherapy and/or immunotherapy agents: Vectibix      To help prevent nausea and vomiting after your treatment, we encourage you to take your nausea medication as directed.  BELOW ARE SYMPTOMS THAT SHOULD BE REPORTED IMMEDIATELY: *FEVER GREATER THAN 100.4 F (38 C) OR HIGHER *CHILLS OR SWEATING *NAUSEA AND VOMITING THAT IS NOT CONTROLLED WITH YOUR NAUSEA MEDICATION *UNUSUAL SHORTNESS OF BREATH *UNUSUAL BRUISING OR BLEEDING *URINARY PROBLEMS (pain or burning when urinating, or frequent urination) *BOWEL PROBLEMS (unusual diarrhea, constipation, pain near the anus) TENDERNESS IN MOUTH AND THROAT WITH OR WITHOUT PRESENCE OF ULCERS (sore throat, sores in mouth, or a toothache) UNUSUAL RASH, SWELLING OR PAIN  UNUSUAL VAGINAL DISCHARGE OR ITCHING   Items with * indicate a potential emergency and should be followed up as soon as possible or go to the Emergency Department if any problems should occur.  Please show the CHEMOTHERAPY ALERT CARD or IMMUNOTHERAPY ALERT CARD at  check-in to the Emergency Department and triage nurse.  Should you have questions after your visit or need to cancel or reschedule your appointment, please contact  [facility] AT [facility]  Dept: [phone]  and follow the prompts.  Office hours are 8:00 a.m. to 4:30 p.m. Monday - Friday. Please note that voicemails left after 4:00 p.m. may not be returned until the following business day.  We are closed weekends and major holidays. You have access to a nurse at all times for urgent questions. Please call the main number to the clinic Dept: [phone] and follow the prompts.   For any non-urgent questions, you may also contact your provider using MyChart. We now offer e-Visits for anyone 62 and older to request care online for non-urgent symptoms. For details visit [email or web address].   Also download the MyChart app! Go to the app store, search "MyChart", open the app, select , and log in with your MyChart username and password.

## 2023-03-19 ENCOUNTER — Encounter: Payer: Self-pay | Admitting: Nurse Practitioner

## 2023-03-19 ENCOUNTER — Encounter: Payer: Self-pay | Admitting: Hematology

## 2023-03-20 ENCOUNTER — Inpatient Hospital Stay: Payer: MEDICAID

## 2023-03-29 ENCOUNTER — Telehealth: Payer: Self-pay | Admitting: Hematology

## 2023-03-30 MED FILL — Dexamethasone Sodium Phosphate Inj 100 MG/10ML: INTRAMUSCULAR | Qty: 1 | Status: AC

## 2023-03-31 ENCOUNTER — Inpatient Hospital Stay: Payer: MEDICAID

## 2023-03-31 ENCOUNTER — Inpatient Hospital Stay (HOSPITAL_BASED_OUTPATIENT_CLINIC_OR_DEPARTMENT_OTHER): Payer: MEDICAID | Admitting: Hematology

## 2023-03-31 VITALS — BP 97/79 | HR 83 | Temp 98.1°F | Resp 18 | Ht 69.0 in | Wt 152.5 lb

## 2023-03-31 DIAGNOSIS — F209 Schizophrenia, unspecified: Secondary | ICD-10-CM | POA: Diagnosis not present

## 2023-03-31 DIAGNOSIS — C186 Malignant neoplasm of descending colon: Secondary | ICD-10-CM | POA: Diagnosis not present

## 2023-03-31 DIAGNOSIS — Z5111 Encounter for antineoplastic chemotherapy: Secondary | ICD-10-CM | POA: Diagnosis not present

## 2023-03-31 DIAGNOSIS — Z95828 Presence of other vascular implants and grafts: Secondary | ICD-10-CM

## 2023-03-31 DIAGNOSIS — C7800 Secondary malignant neoplasm of unspecified lung: Secondary | ICD-10-CM

## 2023-03-31 LAB — CBC WITH DIFFERENTIAL (CANCER CENTER ONLY)
Abs Immature Granulocytes: 0.01 10*3/uL (ref 0.00–0.07)
Basophils Absolute: 0 10*3/uL (ref 0.0–0.1)
Basophils Relative: 1 %
Eosinophils Absolute: 0.1 10*3/uL (ref 0.0–0.5)
Eosinophils Relative: 2 %
HCT: 36.2 % — ABNORMAL LOW (ref 39.0–52.0)
Hemoglobin: 12.2 g/dL — ABNORMAL LOW (ref 13.0–17.0)
Immature Granulocytes: 0 %
Lymphocytes Relative: 18 %
Lymphs Abs: 1 10*3/uL (ref 0.7–4.0)
MCH: 27.7 pg (ref 26.0–34.0)
MCHC: 33.7 g/dL (ref 30.0–36.0)
MCV: 82.3 fL (ref 80.0–100.0)
Monocytes Absolute: 0.4 10*3/uL (ref 0.1–1.0)
Monocytes Relative: 8 %
Neutro Abs: 3.9 10*3/uL (ref 1.7–7.7)
Neutrophils Relative %: 71 %
Platelet Count: 306 10*3/uL (ref 150–400)
RBC: 4.4 MIL/uL (ref 4.22–5.81)
RDW: 16.7 % — ABNORMAL HIGH (ref 11.5–15.5)
WBC Count: 5.5 10*3/uL (ref 4.0–10.5)
nRBC: 0 % (ref 0.0–0.2)

## 2023-03-31 LAB — CMP (CANCER CENTER ONLY)
ALT: 31 U/L (ref 0–44)
AST: 22 U/L (ref 15–41)
Albumin: 4 g/dL (ref 3.5–5.0)
Alkaline Phosphatase: 112 U/L (ref 38–126)
Anion gap: 5 (ref 5–15)
BUN: 17 mg/dL (ref 6–20)
CO2: 25 mmol/L (ref 22–32)
Calcium: 9.3 mg/dL (ref 8.9–10.3)
Chloride: 106 mmol/L (ref 98–111)
Creatinine: 0.74 mg/dL (ref 0.61–1.24)
GFR, Estimated: >60 mL/min (ref >60)
Glucose, Bld: 95 mg/dL (ref 70–99)
Potassium: 3.8 mmol/L (ref 3.5–5.1)
Sodium: 136 mmol/L (ref 135–145)
Total Bilirubin: 0.6 mg/dL (ref 0.3–1.2)
Total Protein: 6.7 g/dL (ref 6.5–8.1)

## 2023-03-31 LAB — MAGNESIUM: Magnesium: 1.8 mg/dL (ref 1.7–2.4)

## 2023-03-31 MED ORDER — SODIUM CHLORIDE 0.9% FLUSH
10.0000 mL | Freq: Once | INTRAVENOUS | Status: AC
  Administered 2023-03-31: 10 mL

## 2023-03-31 MED ORDER — SODIUM CHLORIDE 0.9 % IV SOLN
2400.0000 mg/m2 | INTRAVENOUS | Status: DC
  Administered 2023-03-31: 4350 mg via INTRAVENOUS
  Filled 2023-03-31: qty 87

## 2023-03-31 MED ORDER — SODIUM CHLORIDE 0.9 % IV SOLN: Freq: Once | INTRAVENOUS | Status: AC

## 2023-03-31 MED ORDER — SODIUM CHLORIDE 0.9 % IV SOLN
6.0000 mg/kg | Freq: Once | INTRAVENOUS | Status: AC
  Administered 2023-03-31: 400 mg via INTRAVENOUS
  Filled 2023-03-31: qty 20

## 2023-03-31 MED ORDER — SODIUM CHLORIDE 0.9 % IV SOLN
10.0000 mg | Freq: Once | INTRAVENOUS | Status: AC
  Administered 2023-03-31: 10 mg via INTRAVENOUS
  Filled 2023-03-31: qty 10

## 2023-03-31 MED ORDER — PALONOSETRON HCL INJECTION 0.25 MG/5ML
0.2500 mg | Freq: Once | INTRAVENOUS | Status: AC
  Administered 2023-03-31: 0.25 mg via INTRAVENOUS

## 2023-03-31 MED ORDER — SODIUM CHLORIDE 0.9 % IV SOLN
400.0000 mg/m2 | Freq: Once | INTRAVENOUS | Status: AC
  Administered 2023-03-31: 728 mg via INTRAVENOUS
  Filled 2023-03-31: qty 25

## 2023-03-31 NOTE — Progress Notes (Signed)
[facility]   Telephone:([phone]) [phone] Fax:(336) [phone]   Clinic Follow up Note   Patient Care Team: Medicine, [facility] as PCP - General Berna Bue, MD as Consulting Physician (General Surgery) Malachy Mood, MD as Consulting Physician (Oncology)  Date of Service:  03/31/2023  CHIEF COMPLAINT: f/u of colon cancer   CURRENT THERAPY:  FOLFIRI and beva every 2 weeks   ASSESSMENT:  Matthew [name] is a 57 y.o. 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 -we have changed his treatment to maintenance therapy with 5-fu and vectibix in late March 2024 -He is tolerating treatment very well, will continue. -due to metastasis in both lungs and retroperitoneal lymph nodes, he is probably not a candidate for surgery or local therapy   Schizophrenia [facility]) he was diagnosed with schizophrenia in his 20's, managed with medication, he is on disability. -he lives with his mother; he is able to do ADLs for himself, but not iADLS. They are both on disability. His mother also seems to have some kind of mental impairment     PLAN: -lab reviewed, adequate for treatment -f/u in 2 weeks before next cycle  -restaging CT in 3-4 weeks    SUMMARY OF ONCOLOGIC HISTORY: Oncology History Overview Note   Cancer Staging  Cancer of left colon [facility]) Staging form: Colon and Rectum, AJCC 8th  Edition - Pathologic stage from 08/08/2021: Stage IIIB (pT3, pN1a, cM0) - Signed by Malachy Mood, MD on 08/26/2021    Cancer of left colon (HCC)  04/01/2020 Imaging   IMPRESSION: 1. Gallbladder decompressed bowel also partially calcified gallstones. No pericholecystic inflammation though if there is persisting clinical concern for cholecystitis right upper quadrant ultrasound could be obtained. 2. Circumferential thickening of the distal thoracic esophagus. Could reflect features of esophagitis. Correlate with clinical symptoms and consider endoscopy as clinically warranted. 3. Additional segmental thickening of the mid to distal sigmoid with focal narrowing. No acute surrounding inflammation or resulting obstruction. Findings are nonspecific, and could reflect sequela of prior inflammation/infection. However, recommend correlation with colonoscopy if not recently performed. 4. Mild circumferential bladder wall thickening and indentation of the bladder base by an enlarged prostate. Possibly sequela of chronic outlet obstruction though could correlate with urinalysis to exclude cystitis. 5. Aortic Atherosclerosis (ICD10-I70.0).   08/06/2021 Imaging   IMPRESSION: Sigmoid colonic perforation with small free intraperitoneal gas and infiltration of the mesenteric and omental fat in keeping with changes of peritonitis.   Long segment inflammatory stranding of the sigmoid colon in keeping with a severe infectious or inflammatory colitis. This terminates an area of irregular mural thickening, infiltrative soft tissue within the colonic mesentery, and focal dystrophic calcification. This may represent a chronic inflammatory process, however, a perforated malignancy could appear similarly. There are 2 separate points of perforation which again raise the question of an underlying malignancy.   7.1 cm gas and fluid containing pericolonic abscess within the sigmoid mesentery.   Marked inflammatory  change  of the terminal ileum adjacent to the pericolonic abscess with resultant small bowel obstruction. Fluid within the distal esophagus likely relates to gastroesophageal reflux the setting of vomiting.   Aortic Atherosclerosis (ICD10-I70.0).   08/08/2021 Cancer Staging   Staging form: Colon and Rectum, AJCC 8th Edition - Pathologic stage from 08/08/2021: Stage IIIB (pT3, pN1a, cM0) - Signed by Malachy Mood, MD on 08/26/2021 Stage prefix: Initial diagnosis Total positive nodes: 1 Histologic grading system: 4 grade system Histologic grade (G): G2 Residual tumor (R): R0 - None   08/08/2021 Definitive Surgery   FINAL MICROSCOPIC DIAGNOSIS:   A. COLON, SIGMOID, PARTIAL COLECTOMY:  - Invasive moderately differentiated adenocarcinoma.  - Metastatic carcinoma involving one of twelve lymph nodes (1/12).  - See oncology table below.   ADDENDUM:  Mismatch Repair Protein (IHC)  SUMMARY INTERPRETATION: NORMAL    08/14/2021 Imaging   EXAM: CT ABDOMEN AND PELVIS WITH CONTRAST  IMPRESSION: 1. Post recent sigmoid colectomy with left lower quadrant colostomy. Two small residual foci of air within the pelvic mesentery with mild adjacent thickening, but no abscess or drainable collection. Trace non organized free fluid and stranding in the pelvis. 2. Short segment of small bowel wall thickening and inflammation in the pelvis involving the distal ileum, likely reactive. 3. Dilated distal esophagus, stomach, and small bowel, without discrete transition point, favoring postoperative ileus. 4. Small bilateral pleural effusions and compressive atelectasis. 5. Heterogeneous partially enhancing 14 mm lymph node in the retroperitoneum at the aortoiliac bifurcation, not significantly changed from prior exam. Suspected additional lymph nodes in the anterior common iliac space, not significantly changed from prior exam. Recommend attention at follow-up. 6. Additional chronic findings as described.      08/15/2021 Imaging   EXAM: CT CHEST WITH CONTRAST  IMPRESSION: 1. No evidence of thoracic metastasis. 2. Bilateral small layering pleural effusions with passive atelectasis   08/26/2021 Initial Diagnosis   Cancer of left colon (HCC)   10/10/2021 - 12/12/2021 Chemotherapy   Patient is on Treatment Plan : COLORECTAL Xelox (Capeox) q21d     05/28/2022 - 05/28/2022 Chemotherapy   Patient is on Treatment Plan : COLORECTAL FOLFIRI + Bevacizumab q14d     05/28/2022 -  Chemotherapy   Patient is on Treatment Plan : COLORECTAL FOLFIRI + Panitumumab q14d     07/31/2022 Imaging    IMPRESSION: Decreased bilateral pulmonary metastases.   Stable mild abdominal lymphadenopathy.   No new or progressive metastatic disease within the chest, abdomen, or pelvis.   01/18/2023 Imaging    IMPRESSION: 1. Status post Gertie Gowda pouch sigmoid colon resection with left lower quadrant end colostomy and rectal stump. Unchanged, mild wall thickening of the decompressed distal colon at the ileostomy, as well as the rectal stump. 2. Unchanged small, treated metastatic pulmonary nodules. No new nodules. 3. Unchanged enlarged, coarsely calcified treated lower aortocaval metastatic lymph nodes. No new lymphadenopathy. 4. Coronary artery disease. 5. Cholelithiasis.      INTERVAL HISTORY:  Matthew [name] is here for a follow up of colon cancer. He presents to the clinic accompanied by his mother. He is tolerating chemo well. No new complains   All other systems were reviewed with the patient and are negative.  MEDICAL HISTORY:  Past Medical History:  Diagnosis Date   Cancer (HCC)    Schizophrenia [facility])     SURGICAL HISTORY: Past Surgical History:  Procedure Laterality Date   BRONCHIAL BIOPSY  05/11/2022   Procedure: BRONCHIAL BIOPSIES;  Surgeon: Leslye Peer, MD;  Location: [facility] ENDOSCOPY;  Service: Pulmonary;;   BRONCHIAL BRUSHINGS  05/11/2022   Procedure: BRONCHIAL BRUSHINGS;  Surgeon: Leslye Peer, MD;  Location: [facility] ENDOSCOPY;  Service: Pulmonary;;   BRONCHIAL NEEDLE ASPIRATION BIOPSY  05/11/2022   Procedure: BRONCHIAL NEEDLE ASPIRATION BIOPSIES;  Surgeon: Leslye Peer, MD;  Location: [facility] ENDOSCOPY;  Service: Pulmonary;;   BRONCHIAL WASHINGS  05/11/2022   Procedure: BRONCHIAL WASHINGS;  Surgeon: Leslye Peer, MD;  Location: [facility] ENDOSCOPY;  Service: Pulmonary;;   IR IMAGING GUIDED PORT INSERTION  05/25/2022   LAPAROTOMY N/A 08/08/2021   Procedure: EXPLORATORY LAPAROTOMY;  Surgeon: Berna Bue, MD;  Location: [facility] OR;  Service: General;  Laterality: N/A;   PARTIAL COLECTOMY N/A 08/08/2021   Procedure: PARTIAL COLECTOMY WITH COLOSTOMY;  Surgeon: Berna Bue, MD;  Location: [facility] OR;  Service: General;  Laterality: N/A;   VIDEO BRONCHOSCOPY WITH RADIAL ENDOBRONCHIAL ULTRASOUND  05/11/2022   Procedure: VIDEO BRONCHOSCOPY WITH RADIAL ENDOBRONCHIAL ULTRASOUND;  Surgeon: Leslye Peer, MD;  Location: [facility] ENDOSCOPY;  Service: Pulmonary;;    I have reviewed the social history and family history with the patient and they are unchanged from previous note.  ALLERGIES:  has No Known Allergies.  MEDICATIONS:  Current Outpatient Medications  Medication Sig Dispense Refill   acetaminophen (TYLENOL) 500 MG tablet Take 2 tablets (1,000 mg total) by mouth every 6 (six) hours as needed for mild pain or moderate pain.  0   aspirin EC 81 MG tablet Take 81 mg by mouth daily as needed (for pain or headaches).      benztropine (COGENTIN) 1 MG tablet Take 1 mg at bedtime by mouth.     clindamycin (CLINDAGEL) 1 % gel Apply topically 2 (two) times daily. To skin rash on face and upper body 30 g 0   ferrous sulfate 325 (65 FE) MG EC tablet Take 1 tablet (325 mg total) by mouth daily. 30 tablet 3   hydrocortisone cream 1 % Apply 1 Application topically 2 (two) times daily as needed for itching. For rash 30 g 2   loperamide (IMODIUM) 2 MG capsule Take 1 capsule (2 mg total) by mouth as needed for  diarrhea or loose stools. 30 capsule 0   magnesium oxide (MAG-OX) 400 (240 Mg) MG tablet Take 1 tablet (400 mg total) by mouth daily. 30 tablet 2   Multiple Vitamin (MULTIVITAMIN WITH MINERALS) TABS tablet Take 1 tablet by mouth daily.     ondansetron (ZOFRAN) 8 MG tablet Take 1 tablet (8 mg total) by mouth every 8 (eight) hours as needed for nausea or vomiting. 30 tablet 0   paliperidone (INVEGA SUSTENNA) 156 MG/ML SUSP injection Inject 156 mg every 30 (thirty) days into the muscle.     polyethylene glycol (MIRALAX / GLYCOLAX) 17 g packet Take 17 g by mouth daily as needed for mild constipation or moderate constipation.  0   potassium chloride SA (KLOR-CON M) 20 MEQ tablet Take 1 tablet (20 mEq total) by mouth daily. 30 tablet 2   prochlorperazine (COMPAZINE) 10 MG tablet TAKE 1 TABLET(10 MG) BY MOUTH EVERY 6 HOURS AS NEEDED FOR NAUSEA OR VOMITING 30 tablet 0   No current facility-administered medications for this visit.    PHYSICAL EXAMINATION: ECOG PERFORMANCE STATUS: 1 - Symptomatic but completely ambulatory  Vitals:   03/31/23 1102  BP: 97/79  Pulse: 83  Resp: 18  Temp: 98.1 F (36.7 C)  SpO2: 100%   Wt Readings from Last 3 Encounters:  03/31/23 152 lb 8 oz (69.2  kg)  03/18/23 150 lb 9.6 oz (68.3 kg)  03/04/23 153 lb 9.6 oz (69.7 kg)     GENERAL:alert, no distress and comfortable SKIN: skin color, texture, turgor are normal, no rashes or significant lesions EYES: normal, Conjunctiva are pink and non-injected, sclera clear NECK: supple, thyroid normal size, non-tender, without nodularity LYMPH:  no palpable lymphadenopathy in the cervical, axillary  LUNGS: clear to auscultation and percussion with normal breathing effort HEART: regular rate & rhythm and no murmurs and no lower extremity edema ABDOMEN:abdomen soft, non-tender and normal bowel sounds Musculoskeletal:no cyanosis of digits and no clubbing  NEURO: alert & oriented x 3 with fluent speech, no focal motor/sensory  deficits  LABORATORY DATA:  I have reviewed the data as listed    Latest Ref Rng & Units 03/31/2023   10:18 AM 03/18/2023   10:03 AM 03/04/2023    8:08 AM  CBC  WBC 4.0 - 10.5 K/uL 5.5  4.6  4.1   Hemoglobin 13.0 - 17.0 g/dL 16.1  09.6  04.5   Hematocrit 39.0 - 52.0 % 36.2  38.1  36.7   Platelets 150 - 400 K/uL 306  302  298         Latest Ref Rng & Units 03/31/2023   10:18 AM 03/18/2023   10:03 AM 03/04/2023    8:08 AM  CMP  Glucose 70 - 99 mg/dL 95  409  811   BUN 6 - 20 mg/dL 17  9  10    Creatinine 0.61 - 1.24 mg/dL 9.14  7.82  9.56   Sodium 135 - 145 mmol/L 136  138  142   Potassium 3.5 - 5.1 mmol/L 3.8  3.4  3.7   Chloride 98 - 111 mmol/L 106  106  109   CO2 22 - 32 mmol/L 25  23  26    Calcium 8.9 - 10.3 mg/dL 9.3  9.0  8.9   Total Protein 6.5 - 8.1 g/dL 6.7  6.5  6.3   Total Bilirubin 0.3 - 1.2 mg/dL 0.6  0.8  0.6   Alkaline Phos 38 - 126 U/L 112  86  90   AST 15 - 41 U/L 22  15  15    ALT 0 - 44 U/L 31  11  14        RADIOGRAPHIC STUDIES: I have personally reviewed the radiological images as listed and agreed with the findings in the report. No results found.    Orders Placed This Encounter  Procedures   CBC with Differential (Cancer Center Only)    Standing Status:   Future    Standing Expiration Date:   05/26/2024   CMP (Cancer Center only)    Standing Status:   Future    Standing Expiration Date:   05/26/2024   Magnesium    Standing Status:   Future    Standing Expiration Date:   05/26/2024   All questions were answered. The patient knows to call the clinic with any problems, questions or concerns. No barriers to learning was detected. The total time spent in the appointment was 25 minutes.     Malachy Mood, MD 03/31/2023

## 2023-03-31 NOTE — Assessment & Plan Note (Signed)
stage IIIB p(T3, N1aM0), MSS, KRAS/NRAS/BRAF wild type, lung and node metastasis in 04/2022  -Initially diagnosed in 08/2021 -he completed 4 cycles adjuvant CAPOX 10/10/21 - 12/25/21 (though he somehow was still taking Xeloda through ~02/09/22 due to misunderstandings).  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 -we have changed his treatment to maintenance therapy with 5-fu and vectibix in late March 2024 -He is tolerating treatment very well, will continue. -due to metastasis in both lungs and retroperitoneal lymph nodes, he is probably not a candidate for surgery or local therapy

## 2023-03-31 NOTE — Assessment & Plan Note (Signed)
he was diagnosed with schizophrenia in his 20's, managed with medication, he is on disability. -he lives with his mother; he is able to do ADLs for himself, but not iADLS. They are both on disability. His mother also seems to have some kind of mental impairment 

## 2023-03-31 NOTE — Patient Instructions (Signed)
[facility]  Discharge Instructions: Thank you for choosing [facility] to provide your oncology and hematology care.   If you have a lab appointment with the Cancer Center, please go directly to the Cancer Center and check in at the registration area.   Wear comfortable clothing and clothing appropriate for easy access to any Portacath or PICC line.   We strive to give you quality time with your provider. You may need to reschedule your appointment if you arrive late (15 or more minutes).  Arriving late affects you and other patients whose appointments are after yours.  Also, if you miss three or more appointments without notifying the office, you may be dismissed from the clinic at the provider's discretion.      For prescription refill requests, have your pharmacy contact our office and allow 72 hours for refills to be completed.    Today you received the following chemotherapy and/or immunotherapy agents :  Vectibix, Leucovorin, Fluorourocil.      To help prevent nausea and vomiting after your treatment, we encourage you to take your nausea medication as directed.  BELOW ARE SYMPTOMS THAT SHOULD BE REPORTED IMMEDIATELY: *FEVER GREATER THAN 100.4 F (38 C) OR HIGHER *CHILLS OR SWEATING *NAUSEA AND VOMITING THAT IS NOT CONTROLLED WITH YOUR NAUSEA MEDICATION *UNUSUAL SHORTNESS OF BREATH *UNUSUAL BRUISING OR BLEEDING *URINARY PROBLEMS (pain or burning when urinating, or frequent urination) *BOWEL PROBLEMS (unusual diarrhea, constipation, pain near the anus) TENDERNESS IN MOUTH AND THROAT WITH OR WITHOUT PRESENCE OF ULCERS (sore throat, sores in mouth, or a toothache) UNUSUAL RASH, SWELLING OR PAIN  UNUSUAL VAGINAL DISCHARGE OR ITCHING   Items with * indicate a potential emergency and should be followed up as soon as possible or go to the Emergency Department if any problems should occur.  Please show the CHEMOTHERAPY ALERT CARD or  IMMUNOTHERAPY ALERT CARD at check-in to the Emergency Department and triage nurse.  Should you have questions after your visit or need to cancel or reschedule your appointment, please contact  [facility]  Dept: [phone]  and follow the prompts.  Office hours are 8:00 a.m. to 4:30 p.m. Monday - Friday. Please note that voicemails left after 4:00 p.m. may not be returned until the following business day.  We are closed weekends and major holidays. You have access to a nurse at all times for urgent questions. Please call the main number to the clinic Dept: [phone] and follow the prompts.   For any non-urgent questions, you may also contact your provider using MyChart. We now offer e-Visits for anyone 69 and older to request care online for non-urgent symptoms. For details visit [email or web address].   Also download the MyChart app! Go to the app store, search "MyChart", open the app, select , and log in with your MyChart username and password.

## 2023-04-01 ENCOUNTER — Other Ambulatory Visit: Payer: MEDICAID

## 2023-04-01 ENCOUNTER — Ambulatory Visit: Payer: MEDICAID

## 2023-04-01 ENCOUNTER — Ambulatory Visit: Payer: MEDICAID | Admitting: Hematology

## 2023-04-02 ENCOUNTER — Inpatient Hospital Stay: Payer: MEDICAID

## 2023-04-02 ENCOUNTER — Other Ambulatory Visit: Payer: Self-pay

## 2023-04-02 VITALS — BP 101/70 | HR 89 | Resp 18

## 2023-04-02 DIAGNOSIS — Z5111 Encounter for antineoplastic chemotherapy: Secondary | ICD-10-CM | POA: Diagnosis not present

## 2023-04-02 DIAGNOSIS — C186 Malignant neoplasm of descending colon: Secondary | ICD-10-CM

## 2023-04-02 MED ORDER — SODIUM CHLORIDE 0.9% FLUSH
10.0000 mL | INTRAVENOUS | Status: DC | PRN
  Administered 2023-04-02: 10 mL

## 2023-04-02 MED ORDER — HEPARIN SOD (PORK) LOCK FLUSH 100 UNIT/ML IV SOLN
500.0000 [IU] | Freq: Once | INTRAVENOUS | Status: AC | PRN
  Administered 2023-04-02: 500 [IU]

## 2023-04-03 ENCOUNTER — Inpatient Hospital Stay: Payer: MEDICAID

## 2023-04-03 ENCOUNTER — Other Ambulatory Visit: Payer: Self-pay

## 2023-04-14 MED FILL — Dexamethasone Sodium Phosphate Inj 100 MG/10ML: INTRAMUSCULAR | Qty: 1 | Status: AC

## 2023-04-15 ENCOUNTER — Inpatient Hospital Stay: Payer: MEDICAID | Attending: Hematology

## 2023-04-15 ENCOUNTER — Inpatient Hospital Stay: Payer: MEDICAID

## 2023-04-15 ENCOUNTER — Inpatient Hospital Stay: Payer: MEDICAID | Admitting: Physician Assistant

## 2023-04-15 VITALS — BP 109/81 | HR 85 | Resp 18

## 2023-04-15 DIAGNOSIS — Z79899 Other long term (current) drug therapy: Secondary | ICD-10-CM | POA: Diagnosis not present

## 2023-04-15 DIAGNOSIS — C186 Malignant neoplasm of descending colon: Secondary | ICD-10-CM

## 2023-04-15 DIAGNOSIS — C772 Secondary and unspecified malignant neoplasm of intra-abdominal lymph nodes: Secondary | ICD-10-CM | POA: Insufficient documentation

## 2023-04-15 DIAGNOSIS — C7800 Secondary malignant neoplasm of unspecified lung: Secondary | ICD-10-CM

## 2023-04-15 DIAGNOSIS — Z5111 Encounter for antineoplastic chemotherapy: Secondary | ICD-10-CM

## 2023-04-15 DIAGNOSIS — C7802 Secondary malignant neoplasm of left lung: Secondary | ICD-10-CM | POA: Diagnosis not present

## 2023-04-15 DIAGNOSIS — C7801 Secondary malignant neoplasm of right lung: Secondary | ICD-10-CM | POA: Diagnosis not present

## 2023-04-15 DIAGNOSIS — C189 Malignant neoplasm of colon, unspecified: Secondary | ICD-10-CM | POA: Diagnosis not present

## 2023-04-15 DIAGNOSIS — Z5112 Encounter for antineoplastic immunotherapy: Secondary | ICD-10-CM | POA: Insufficient documentation

## 2023-04-15 DIAGNOSIS — Z95828 Presence of other vascular implants and grafts: Secondary | ICD-10-CM

## 2023-04-15 LAB — CBC WITH DIFFERENTIAL (CANCER CENTER ONLY)
Abs Immature Granulocytes: 0.01 10*3/uL (ref 0.00–0.07)
Basophils Absolute: 0 10*3/uL (ref 0.0–0.1)
Basophils Relative: 1 %
Eosinophils Absolute: 0.2 10*3/uL (ref 0.0–0.5)
Eosinophils Relative: 4 %
HCT: 38 % — ABNORMAL LOW (ref 39.0–52.0)
Hemoglobin: 12.1 g/dL — ABNORMAL LOW (ref 13.0–17.0)
Immature Granulocytes: 0 %
Lymphocytes Relative: 25 %
Lymphs Abs: 1.2 10*3/uL (ref 0.7–4.0)
MCH: 26.9 pg (ref 26.0–34.0)
MCHC: 31.8 g/dL (ref 30.0–36.0)
MCV: 84.4 fL (ref 80.0–100.0)
Monocytes Absolute: 0.2 10*3/uL (ref 0.1–1.0)
Monocytes Relative: 5 %
Neutro Abs: 3 10*3/uL (ref 1.7–7.7)
Neutrophils Relative %: 65 %
Platelet Count: 309 10*3/uL (ref 150–400)
RBC: 4.5 MIL/uL (ref 4.22–5.81)
RDW: 16.9 % — ABNORMAL HIGH (ref 11.5–15.5)
WBC Count: 4.6 10*3/uL (ref 4.0–10.5)
nRBC: 0 % (ref 0.0–0.2)

## 2023-04-15 LAB — CMP (CANCER CENTER ONLY)
ALT: 23 U/L (ref 0–44)
AST: 19 U/L (ref 15–41)
Albumin: 3.8 g/dL (ref 3.5–5.0)
Alkaline Phosphatase: 107 U/L (ref 38–126)
Anion gap: 8 (ref 5–15)
BUN: 12 mg/dL (ref 6–20)
CO2: 24 mmol/L (ref 22–32)
Calcium: 9 mg/dL (ref 8.9–10.3)
Chloride: 108 mmol/L (ref 98–111)
Creatinine: 0.82 mg/dL (ref 0.61–1.24)
GFR, Estimated: >60 mL/min (ref >60)
Glucose, Bld: 157 mg/dL — ABNORMAL HIGH (ref 70–99)
Potassium: 3.4 mmol/L — ABNORMAL LOW (ref 3.5–5.1)
Sodium: 140 mmol/L (ref 135–145)
Total Bilirubin: 0.5 mg/dL (ref 0.3–1.2)
Total Protein: 6.5 g/dL (ref 6.5–8.1)

## 2023-04-15 LAB — MAGNESIUM: Magnesium: 1.8 mg/dL (ref 1.7–2.4)

## 2023-04-15 MED ORDER — PALONOSETRON HCL INJECTION 0.25 MG/5ML
0.2500 mg | Freq: Once | INTRAVENOUS | Status: AC
  Administered 2023-04-15: 0.25 mg via INTRAVENOUS
  Filled 2023-04-15: qty 5

## 2023-04-15 MED ORDER — SODIUM CHLORIDE 0.9% FLUSH
10.0000 mL | INTRAVENOUS | Status: DC | PRN
  Administered 2023-04-15: 10 mL

## 2023-04-15 MED ORDER — SODIUM CHLORIDE 0.9 % IV SOLN
10.0000 mg | Freq: Once | INTRAVENOUS | Status: AC
  Administered 2023-04-15: 10 mg via INTRAVENOUS
  Filled 2023-04-15: qty 10

## 2023-04-15 MED ORDER — SODIUM CHLORIDE 0.9 % IV SOLN
400.0000 mg/m2 | Freq: Once | INTRAVENOUS | Status: AC
  Administered 2023-04-15: 728 mg via INTRAVENOUS
  Filled 2023-04-15: qty 36.4

## 2023-04-15 MED ORDER — SODIUM CHLORIDE 0.9 % IV SOLN
2400.0000 mg/m2 | INTRAVENOUS | Status: DC
  Administered 2023-04-15: 4350 mg via INTRAVENOUS
  Filled 2023-04-15: qty 87

## 2023-04-15 MED ORDER — SODIUM CHLORIDE 0.9 % IV SOLN: Freq: Once | INTRAVENOUS | Status: AC

## 2023-04-15 MED ORDER — SODIUM CHLORIDE 0.9 % IV SOLN
6.0000 mg/kg | Freq: Once | INTRAVENOUS | Status: AC
  Administered 2023-04-15: 400 mg via INTRAVENOUS
  Filled 2023-04-15: qty 20

## 2023-04-15 MED ORDER — SODIUM CHLORIDE 0.9% FLUSH
10.0000 mL | Freq: Once | INTRAVENOUS | Status: AC
  Administered 2023-04-15: 10 mL

## 2023-04-15 NOTE — Patient Instructions (Signed)
[facility]  Discharge Instructions: Thank you for choosing [facility] to provide your oncology and hematology care.   If you have a lab appointment with the Cancer Center, please go directly to the Cancer Center and check in at the registration area.   Wear comfortable clothing and clothing appropriate for easy access to any Portacath or PICC line.   We strive to give you quality time with your provider. You may need to reschedule your appointment if you arrive late (15 or more minutes).  Arriving late affects you and other patients whose appointments are after yours.  Also, if you miss three or more appointments without notifying the office, you may be dismissed from the clinic at the provider's discretion.      For prescription refill requests, have your pharmacy contact our office and allow 72 hours for refills to be completed.    Today you received the following chemotherapy and/or immunotherapy agents Vectibix / Leucovorin / Fluorouracil      To help prevent nausea and vomiting after your treatment, we encourage you to take your nausea medication as directed.  BELOW ARE SYMPTOMS THAT SHOULD BE REPORTED IMMEDIATELY: *FEVER GREATER THAN 100.4 F (38 C) OR HIGHER *CHILLS OR SWEATING *NAUSEA AND VOMITING THAT IS NOT CONTROLLED WITH YOUR NAUSEA MEDICATION *UNUSUAL SHORTNESS OF BREATH *UNUSUAL BRUISING OR BLEEDING *URINARY PROBLEMS (pain or burning when urinating, or frequent urination) *BOWEL PROBLEMS (unusual diarrhea, constipation, pain near the anus) TENDERNESS IN MOUTH AND THROAT WITH OR WITHOUT PRESENCE OF ULCERS (sore throat, sores in mouth, or a toothache) UNUSUAL RASH, SWELLING OR PAIN  UNUSUAL VAGINAL DISCHARGE OR ITCHING   Items with * indicate a potential emergency and should be followed up as soon as possible or go to the Emergency Department if any problems should occur.  Please show the CHEMOTHERAPY ALERT CARD or  IMMUNOTHERAPY ALERT CARD at check-in to the Emergency Department and triage nurse.  Should you have questions after your visit or need to cancel or reschedule your appointment, please contact [facility]  Dept: [phone]  and follow the prompts.  Office hours are 8:00 a.m. to 4:30 p.m. Monday - Friday. Please note that voicemails left after 4:00 p.m. may not be returned until the following business day.  We are closed weekends and major holidays. You have access to a nurse at all times for urgent questions. Please call the main number to the clinic Dept: [phone] and follow the prompts.   For any non-urgent questions, you may also contact your provider using MyChart. We now offer e-Visits for anyone 73 and older to request care online for non-urgent symptoms. For details visit [email or web address].   Also download the MyChart app! Go to the app store, search "MyChart", open the app, select , and log in with your MyChart username and password.

## 2023-04-15 NOTE — Progress Notes (Signed)
[facility]   [phone] [phone]   Clinic Follow up Note   Patient Care Team: Medicine, [facility] as PCP - General Matthew Bue, MD as Consulting Physician (General Surgery) Matthew Mood, MD as Consulting Physician (Oncology)  Date of Service:  04/15/2023  CHIEF COMPLAINT: f/u of colon cancer   CURRENT THERAPY:  FOLFIRI and beva every 2 weeks   SUMMARY OF ONCOLOGIC HISTORY: Oncology History Overview Note   Cancer Staging  Cancer of left colon [facility]) Staging form: Colon and Rectum, AJCC 8th Edition - Pathologic stage from 08/08/2021: Stage IIIB (pT3, pN1a, cM0) - Signed by Matthew Mood, MD on 08/26/2021    Cancer of left colon (HCC)  04/01/2020 Imaging   IMPRESSION: 1. Gallbladder decompressed bowel also partially calcified gallstones. No pericholecystic inflammation though if there is persisting clinical concern for cholecystitis right upper quadrant ultrasound could be obtained. 2. Circumferential thickening of the distal thoracic esophagus. Could reflect features of esophagitis. Correlate with clinical symptoms and consider endoscopy as clinically warranted. 3. Additional segmental thickening of the mid to distal sigmoid with focal narrowing. No acute surrounding inflammation or resulting obstruction. Findings are nonspecific, and could reflect sequela of prior inflammation/infection. However, recommend correlation with colonoscopy if not recently performed. 4. Mild circumferential bladder wall thickening and indentation of the bladder base by an enlarged prostate. Possibly sequela of chronic outlet obstruction though could correlate with urinalysis to exclude cystitis. 5. Aortic Atherosclerosis (ICD10-I70.0).   08/06/2021 Imaging   IMPRESSION: Sigmoid colonic perforation with small free intraperitoneal gas and infiltration of the mesenteric and omental fat in keeping with changes of peritonitis.   Long segment  inflammatory stranding of the sigmoid colon in keeping with a severe infectious or inflammatory colitis. This terminates an area of irregular mural thickening, infiltrative soft tissue within the colonic mesentery, and focal dystrophic calcification. This may represent a chronic inflammatory process, however, a perforated malignancy could appear similarly. There are 2 separate points of perforation which again raise the question of an underlying malignancy.   7.1 cm gas and fluid containing pericolonic abscess within the sigmoid mesentery.   Marked inflammatory change of the terminal ileum adjacent to the pericolonic abscess with resultant small bowel obstruction. Fluid within the distal esophagus likely relates to gastroesophageal reflux the setting of vomiting.   Aortic Atherosclerosis (ICD10-I70.0).   08/08/2021 Cancer Staging   Staging form: Colon and Rectum, AJCC 8th Edition - Pathologic stage from 08/08/2021: Stage IIIB (pT3, pN1a, cM0) - Signed by Matthew Mood, MD on 08/26/2021 Stage prefix: Initial diagnosis Total positive nodes: 1 Histologic grading system: 4 grade system Histologic grade (G): G2 Residual tumor (R): R0 - None   08/08/2021 Definitive Surgery   FINAL MICROSCOPIC DIAGNOSIS:   A. COLON, SIGMOID, PARTIAL COLECTOMY:  - Invasive moderately differentiated adenocarcinoma.  - Metastatic carcinoma involving one of twelve lymph nodes (1/12).  - See oncology table below.   ADDENDUM:  Mismatch Repair Protein (IHC)  SUMMARY INTERPRETATION: NORMAL    08/14/2021 Imaging   EXAM: CT ABDOMEN AND PELVIS WITH CONTRAST  IMPRESSION: 1. Post recent sigmoid colectomy with left lower quadrant colostomy. Two small residual foci of air within the pelvic mesentery with mild adjacent thickening, but no abscess or drainable collection. Trace non organized free fluid and stranding in the pelvis. 2. Short segment of small bowel wall thickening and inflammation in the pelvis involving the distal  ileum, likely reactive. 3. Dilated distal esophagus, stomach, and small bowel, without discrete transition point,  favoring postoperative ileus. 4. Small bilateral pleural effusions and compressive atelectasis. 5. Heterogeneous partially enhancing 14 mm lymph node in the retroperitoneum at the aortoiliac bifurcation, not significantly changed from prior exam. Suspected additional lymph nodes in the anterior common iliac space, not significantly changed from prior exam. Recommend attention at follow-up. 6. Additional chronic findings as described.     08/15/2021 Imaging   EXAM: CT CHEST WITH CONTRAST  IMPRESSION: 1. No evidence of thoracic metastasis. 2. Bilateral small layering pleural effusions with passive atelectasis   08/26/2021 Initial Diagnosis   Cancer of left colon (HCC)   10/10/2021 - 12/12/2021 Chemotherapy   Patient is on Treatment Plan : COLORECTAL Xelox (Capeox) q21d     05/28/2022 - 05/28/2022 Chemotherapy   Patient is on Treatment Plan : COLORECTAL FOLFIRI + Bevacizumab q14d     05/28/2022 -  Chemotherapy   Patient is on Treatment Plan : COLORECTAL FOLFIRI + Panitumumab q14d     07/31/2022 Imaging    IMPRESSION: Decreased bilateral pulmonary metastases.   Stable mild abdominal lymphadenopathy.   No new or progressive metastatic disease within the chest, abdomen, or pelvis.   01/18/2023 Imaging    IMPRESSION: 1. Status post Gertie Gowda pouch sigmoid colon resection with left lower quadrant end colostomy and rectal stump. Unchanged, mild wall thickening of the decompressed distal colon at the ileostomy, as well as the rectal stump. 2. Unchanged small, treated metastatic pulmonary nodules. No new nodules. 3. Unchanged enlarged, coarsely calcified treated lower aortocaval metastatic lymph nodes. No new lymphadenopathy. 4. Coronary artery disease. 5. Cholelithiasis.      INTERVAL HISTORY:  Matthew [name] is here for a follow up of colon cancer. He was last  seen by Dr. Mosetta [name] on 03/31/2023. He presents to the clinic accompanied by his mother.   Matthew [name] reports he is tolerating treatment without any new or concerning symptoms. He reports his appetite and energy levels are overall stable. He denies nausea, vomiting or abdominal pain. His bowel habits are unchanged. He denies easy bruising or signs of active bleeding. He denies fevers, chills, sweats, shortness of breath, chest pain or cough. He has no other complaints.    All other systems were reviewed with the patient and are negative.  MEDICAL HISTORY:  Past Medical History:  Diagnosis Date   Cancer (HCC)    Schizophrenia [facility])     SURGICAL HISTORY: Past Surgical History:  Procedure Laterality Date   BRONCHIAL BIOPSY  05/11/2022   Procedure: BRONCHIAL BIOPSIES;  Surgeon: Leslye Peer, MD;  Location: [facility] ENDOSCOPY;  Service: Pulmonary;;   BRONCHIAL BRUSHINGS  05/11/2022   Procedure: BRONCHIAL BRUSHINGS;  Surgeon: Leslye Peer, MD;  Location: [facility] ENDOSCOPY;  Service: Pulmonary;;   BRONCHIAL NEEDLE ASPIRATION BIOPSY  05/11/2022   Procedure: BRONCHIAL NEEDLE ASPIRATION BIOPSIES;  Surgeon: Leslye Peer, MD;  Location: [facility] ENDOSCOPY;  Service: Pulmonary;;   BRONCHIAL WASHINGS  05/11/2022   Procedure: BRONCHIAL WASHINGS;  Surgeon: Leslye Peer, MD;  Location: [facility] ENDOSCOPY;  Service: Pulmonary;;   IR IMAGING GUIDED PORT INSERTION  05/25/2022   LAPAROTOMY N/A 08/08/2021   Procedure: EXPLORATORY LAPAROTOMY;  Surgeon: Matthew Bue, MD;  Location: [facility] OR;  Service: General;  Laterality: N/A;   PARTIAL COLECTOMY N/A 08/08/2021   Procedure: PARTIAL COLECTOMY WITH COLOSTOMY;  Surgeon: Matthew Bue, MD;  Location: [facility] OR;  Service: General;  Laterality: N/A;   VIDEO BRONCHOSCOPY WITH RADIAL ENDOBRONCHIAL ULTRASOUND  05/11/2022   Procedure: VIDEO BRONCHOSCOPY WITH RADIAL ENDOBRONCHIAL ULTRASOUND;  Surgeon: Delton Coombes,  Les Pou, MD;  Location: [facility] ENDOSCOPY;  Service: Pulmonary;;    I have reviewed the  social history and family history with the patient and they are unchanged from previous note.  ALLERGIES:  has No Known Allergies.  MEDICATIONS:  Current Outpatient Medications  Medication Sig Dispense Refill   acetaminophen (TYLENOL) 500 MG tablet Take 2 tablets (1,000 mg total) by mouth every 6 (six) hours as needed for mild pain or moderate pain.  0   aspirin EC 81 MG tablet Take 81 mg by mouth daily as needed (for pain or headaches).      benztropine (COGENTIN) 1 MG tablet Take 1 mg at bedtime by mouth.     clindamycin (CLINDAGEL) 1 % gel Apply topically 2 (two) times daily. To skin rash on face and upper body 30 g 0   ferrous sulfate 325 (65 FE) MG EC tablet Take 1 tablet (325 mg total) by mouth daily. 30 tablet 3   hydrocortisone cream 1 % Apply 1 Application topically 2 (two) times daily as needed for itching. For rash 30 g 2   loperamide (IMODIUM) 2 MG capsule Take 1 capsule (2 mg total) by mouth as needed for diarrhea or loose stools. 30 capsule 0   magnesium oxide (MAG-OX) 400 (240 Mg) MG tablet Take 1 tablet (400 mg total) by mouth daily. 30 tablet 2   Multiple Vitamin (MULTIVITAMIN WITH MINERALS) TABS tablet Take 1 tablet by mouth daily.     ondansetron (ZOFRAN) 8 MG tablet Take 1 tablet (8 mg total) by mouth every 8 (eight) hours as needed for nausea or vomiting. 30 tablet 0   paliperidone (INVEGA SUSTENNA) 156 MG/ML SUSP injection Inject 156 mg every 30 (thirty) days into the muscle.     polyethylene glycol (MIRALAX / GLYCOLAX) 17 g packet Take 17 g by mouth daily as needed for mild constipation or moderate constipation.  0   potassium chloride SA (KLOR-CON M) 20 MEQ tablet Take 1 tablet (20 mEq total) by mouth daily. 30 tablet 2   prochlorperazine (COMPAZINE) 10 MG tablet TAKE 1 TABLET(10 MG) BY MOUTH EVERY 6 HOURS AS NEEDED FOR NAUSEA OR VOMITING 30 tablet 0   No current facility-administered medications for this visit.   Facility-Administered Medications Ordered in Other  Visits  Medication Dose Route Frequency Provider Last Rate Last Admin   fluorouracil (ADRUCIL) 4,350 mg in sodium chloride 0.9 % 63 mL chemo infusion  2,400 mg/m2 (Treatment Plan Recorded) Intravenous 1 day or 1 dose Matthew Mood, MD   Infusion Verify at 04/15/23 1344   sodium chloride flush (NS) 0.9 % injection 10 mL  10 mL Intracatheter PRN Matthew Mood, MD   10 mL at 04/15/23 1318    PHYSICAL EXAMINATION: ECOG PERFORMANCE STATUS: 1 - Symptomatic but completely ambulatory  There were no vitals filed for this visit.  Wt Readings from Last 3 Encounters:  03/31/23 152 lb 8 oz (69.2 kg)  03/18/23 150 lb 9.6 oz (68.3 kg)  03/04/23 153 lb 9.6 oz (69.7 kg)     GENERAL:alert, no distress and comfortable SKIN: skin color, texture, turgor are normal, no rashes or significant lesions EYES: normal, Conjunctiva are pink and non-injected, sclera clear LUNGS: clear to auscultation and percussion with normal breathing effort HEART: regular rate & rhythm and no murmurs and no lower extremity edema Musculoskeletal:no cyanosis of digits and no clubbing  NEURO: alert & oriented x 3 with fluent speech, no focal motor/sensory deficits  LABORATORY DATA:  I have reviewed the  data as listed    Latest Ref Rng & Units 04/15/2023   10:20 AM 03/31/2023   10:18 AM 03/18/2023   10:03 AM  CBC  WBC 4.0 - 10.5 K/uL 4.6  5.5  4.6   Hemoglobin 13.0 - 17.0 g/dL 01.0  27.2  53.6   Hematocrit 39.0 - 52.0 % 38.0  36.2  38.1   Platelets 150 - 400 K/uL 309  306  302         Latest Ref Rng & Units 04/15/2023   10:20 AM 03/31/2023   10:18 AM 03/18/2023   10:03 AM  CMP  Glucose 70 - 99 mg/dL 644  95  034   BUN 6 - 20 mg/dL 12  17  9    Creatinine 0.61 - 1.24 mg/dL 7.42  5.95  6.38   Sodium 135 - 145 mmol/L 140  136  138   Potassium 3.5 - 5.1 mmol/L 3.4  3.8  3.4   Chloride 98 - 111 mmol/L 108  106  106   CO2 22 - 32 mmol/L 24  25  23    Calcium 8.9 - 10.3 mg/dL 9.0  9.3  9.0   Total Protein 6.5 - 8.1 g/dL 6.5  6.7  6.5    Total Bilirubin 0.3 - 1.2 mg/dL 0.5  0.6  0.8   Alkaline Phos 38 - 126 U/L 107  112  86   AST 15 - 41 U/L 19  22  15    ALT 0 - 44 U/L 23  31  11        RADIOGRAPHIC STUDIES: I have personally reviewed the radiological images as listed and agreed with the findings in the report. No results found.    ASSESSMENT:  Matthew [name] is a 57 y.o. male who returns for a follow up for colon cancer.    Cancer of left colon (HCC)- Stage IIIB p(T3, N1aM0), MSS, KRAS/NRAS/BRAF wild type, lung and node metastasis in 04/2022  -Initially diagnosed in 08/2021 -Completed 4 cycles adjuvant CAPOX 10/10/21 - 12/25/21 (though he somehow was still taking Xeloda through ~02/09/22 due to misunderstandings).  -He had a metastatic recurrence in the lungs and retroperitoneal adenopathy in September 2023 -Started FOLFIRI on 05/08/2022, Vectibix added from cycle 2 -Restaging CT scan image from August 02, 2022 and 10/28/2022 both showed good partial response in pulmonary metastasis, no other new lesions. -Changed to maintenance therapy with 5-fu and vectibix in late March 2024 -Due to metastasis in both lungs and retroperitoneal lymph nodes, he is probably not a candidate for surgery or local therapy    Schizophrenia (HCC) -He was diagnosed with schizophrenia in his 20's, managed with medication, he is on disability. -he lives with his mother; he is able to do ADLs for himself, but not iADLS. They are both on disability. His mother also seems to have some kind of mental impairment       PLAN: -Due for Cycle 23, Day 1 of 5FU plus Vectibix -Labs from today were reviewed and adequate for treatment. WBC 4.6, Hgb 12.1, Plt 309, Creatinine and LFTs normal.  -Proceed with treatment today without any dose modifications -Scheduled for repeat CT CAP on 04/23/2023.  -RTC in 2 weeks with labs and follow up before cycle 24.   Orders Placed This Encounter  Procedures   CT CHEST ABDOMEN PELVIS W CONTRAST    Standing Status:    Future    Standing Expiration Date:   04/14/2024    Order Specific Question:   If indicated  for the ordered procedure, I authorize the administration of contrast media per Radiology protocol    Answer:   Yes    Order Specific Question:   Does the patient have a contrast media/X-ray dye allergy?    Answer:   No    Order Specific Question:   Preferred imaging location?    Answer:   [facility]    Order Specific Question:   If indicated for the ordered procedure, I authorize the administration of oral contrast media per Radiology protocol    Answer:   Yes   All questions were answered. The patient knows to call the clinic with any problems, questions or concerns. No barriers to learning was detected.   I have spent a total of 30 minutes minutes of face-to-face and non-face-to-face time, preparing to see the patient, performing a medically appropriate examination, counseling and educating the patient, documenting clinical information in the electronic health record, independently interpreting results and communicating results to the patient, and care coordination.   Georga Kaufmann PA-C Dept of Hematology and Oncology [facility] at [facility] Phone: [phone]

## 2023-04-17 ENCOUNTER — Inpatient Hospital Stay: Payer: MEDICAID

## 2023-04-17 VITALS — BP 103/72 | HR 99 | Temp 98.9°F

## 2023-04-17 DIAGNOSIS — Z5111 Encounter for antineoplastic chemotherapy: Secondary | ICD-10-CM | POA: Diagnosis not present

## 2023-04-17 DIAGNOSIS — C186 Malignant neoplasm of descending colon: Secondary | ICD-10-CM

## 2023-04-17 MED ORDER — SODIUM CHLORIDE 0.9% FLUSH
10.0000 mL | INTRAVENOUS | Status: DC | PRN
  Administered 2023-04-17: 10 mL

## 2023-04-17 MED ORDER — HEPARIN SOD (PORK) LOCK FLUSH 100 UNIT/ML IV SOLN
500.0000 [IU] | Freq: Once | INTRAVENOUS | Status: AC | PRN
  Administered 2023-04-17: 500 [IU]

## 2023-04-22 ENCOUNTER — Other Ambulatory Visit: Payer: Self-pay | Admitting: Hematology

## 2023-04-23 ENCOUNTER — Ambulatory Visit (HOSPITAL_COMMUNITY)
Admission: RE | Admit: 2023-04-23 | Discharge: 2023-04-23 | Disposition: A | Discharge status: Home / Self Care | Payer: MEDICAID | Source: Ambulatory Visit | Attending: Physician Assistant

## 2023-04-23 ENCOUNTER — Telehealth: Payer: Self-pay | Admitting: Physician Assistant

## 2023-04-23 DIAGNOSIS — C186 Malignant neoplasm of descending colon: Secondary | ICD-10-CM | POA: Insufficient documentation

## 2023-04-23 MED ORDER — IOHEXOL 300 MG/ML  SOLN: 100.0000 mL | Freq: Once | INTRAMUSCULAR | Status: DC | PRN

## 2023-04-23 MED ORDER — IOHEXOL 300 MG/ML  SOLN
100.0000 mL | Freq: Once | INTRAMUSCULAR | Status: AC | PRN
  Administered 2023-04-23: 100 mL via INTRAVENOUS

## 2023-04-23 MED ORDER — IOHEXOL 9 MG/ML PO SOLN
ORAL | Status: AC
  Filled 2023-04-23: qty 1000

## 2023-04-23 MED ORDER — IOHEXOL 9 MG/ML PO SOLN
500.0000 mL | ORAL | Status: AC
  Administered 2023-04-23: 1000 mL via ORAL

## 2023-04-23 MED ORDER — SODIUM CHLORIDE (PF) 0.9 % IJ SOLN
INTRAMUSCULAR | Status: AC
  Filled 2023-04-23: qty 50

## 2023-04-23 MED ORDER — APIXABAN (ELIQUIS) VTE STARTER PACK (10MG AND 5MG): ORAL_TABLET | ORAL | 0 refills | Status: DC

## 2023-04-23 NOTE — Telephone Encounter (Signed)
I called patient's mother, Shakeel Drzewiecki, to notify her that CT scan from today did identify a left lower lobe segmental pulmonary emboli, incidental finding.No evidence for right heart strain. Patient's mother denies any new changes in patient's breathing including shortness of breath or chest pain.   Discussed case with on-call provider, Dr. Leonides Schanz, who recommends Eliquis therapy. I sent start pack to local pharmacy on file. Patient's mother will stop by the pharmacy to pick it up today.   She will call back the clinic if there any issues with the prescription.

## 2023-04-26 ENCOUNTER — Encounter: Payer: Self-pay | Admitting: Hematology

## 2023-04-27 ENCOUNTER — Encounter: Payer: Self-pay | Admitting: Hematology

## 2023-04-27 MED FILL — Dexamethasone Sodium Phosphate Inj 100 MG/10ML: INTRAMUSCULAR | Qty: 1 | Status: AC

## 2023-04-28 ENCOUNTER — Inpatient Hospital Stay: Payer: MEDICAID

## 2023-04-28 ENCOUNTER — Encounter: Payer: Self-pay | Admitting: Hematology

## 2023-04-28 ENCOUNTER — Inpatient Hospital Stay (HOSPITAL_BASED_OUTPATIENT_CLINIC_OR_DEPARTMENT_OTHER): Payer: MEDICAID | Admitting: Hematology

## 2023-04-28 VITALS — BP 130/85 | HR 91

## 2023-04-28 VITALS — BP 106/80 | HR 91 | Temp 98.3°F | Resp 16 | Ht 69.0 in | Wt 156.0 lb

## 2023-04-28 DIAGNOSIS — Z95828 Presence of other vascular implants and grafts: Secondary | ICD-10-CM

## 2023-04-28 DIAGNOSIS — Z5111 Encounter for antineoplastic chemotherapy: Secondary | ICD-10-CM | POA: Diagnosis not present

## 2023-04-28 DIAGNOSIS — C189 Malignant neoplasm of colon, unspecified: Secondary | ICD-10-CM

## 2023-04-28 DIAGNOSIS — C186 Malignant neoplasm of descending colon: Secondary | ICD-10-CM | POA: Diagnosis not present

## 2023-04-28 LAB — CBC WITH DIFFERENTIAL (CANCER CENTER ONLY)
Abs Immature Granulocytes: 0.01 10*3/uL (ref 0.00–0.07)
Basophils Absolute: 0.1 10*3/uL (ref 0.0–0.1)
Basophils Relative: 1 %
Eosinophils Absolute: 0.1 10*3/uL (ref 0.0–0.5)
Eosinophils Relative: 3 %
HCT: 35.3 % — ABNORMAL LOW (ref 39.0–52.0)
Hemoglobin: 11.6 g/dL — ABNORMAL LOW (ref 13.0–17.0)
Immature Granulocytes: 0 %
Lymphocytes Relative: 28 %
Lymphs Abs: 1.2 10*3/uL (ref 0.7–4.0)
MCH: 27.2 pg (ref 26.0–34.0)
MCHC: 32.9 g/dL (ref 30.0–36.0)
MCV: 82.9 fL (ref 80.0–100.0)
Monocytes Absolute: 0.5 10*3/uL (ref 0.1–1.0)
Monocytes Relative: 12 %
Neutro Abs: 2.5 10*3/uL (ref 1.7–7.7)
Neutrophils Relative %: 56 %
Platelet Count: 271 10*3/uL (ref 150–400)
RBC: 4.26 MIL/uL (ref 4.22–5.81)
RDW: 17.7 % — ABNORMAL HIGH (ref 11.5–15.5)
WBC Count: 4.5 10*3/uL (ref 4.0–10.5)
nRBC: 0 % (ref 0.0–0.2)

## 2023-04-28 LAB — CMP (CANCER CENTER ONLY)
ALT: 24 U/L (ref 0–44)
AST: 19 U/L (ref 15–41)
Albumin: 3.9 g/dL (ref 3.5–5.0)
Alkaline Phosphatase: 113 U/L (ref 38–126)
Anion gap: 7 (ref 5–15)
BUN: 8 mg/dL (ref 6–20)
CO2: 25 mmol/L (ref 22–32)
Calcium: 9.4 mg/dL (ref 8.9–10.3)
Chloride: 108 mmol/L (ref 98–111)
Creatinine: 0.82 mg/dL (ref 0.61–1.24)
GFR, Estimated: >60 mL/min (ref >60)
Glucose, Bld: 87 mg/dL (ref 70–99)
Potassium: 3.5 mmol/L (ref 3.5–5.1)
Sodium: 140 mmol/L (ref 135–145)
Total Bilirubin: 0.5 mg/dL (ref 0.3–1.2)
Total Protein: 6.6 g/dL (ref 6.5–8.1)

## 2023-04-28 LAB — MAGNESIUM: Magnesium: 1.8 mg/dL (ref 1.7–2.4)

## 2023-04-28 MED ORDER — SODIUM CHLORIDE 0.9 % IV SOLN: Freq: Once | INTRAVENOUS | Status: AC

## 2023-04-28 MED ORDER — SODIUM CHLORIDE 0.9 % IV SOLN
6.0000 mg/kg | Freq: Once | INTRAVENOUS | Status: AC
  Administered 2023-04-28: 400 mg via INTRAVENOUS
  Filled 2023-04-28: qty 20

## 2023-04-28 MED ORDER — SODIUM CHLORIDE 0.9% FLUSH: 10.0000 mL | INTRAVENOUS | Status: DC | PRN

## 2023-04-28 MED ORDER — SODIUM CHLORIDE 0.9 % IV SOLN
2400.0000 mg/m2 | INTRAVENOUS | Status: DC
  Administered 2023-04-28: 4350 mg via INTRAVENOUS
  Filled 2023-04-28: qty 87

## 2023-04-28 MED ORDER — HEPARIN SOD (PORK) LOCK FLUSH 100 UNIT/ML IV SOLN: 500.0000 [IU] | Freq: Once | INTRAVENOUS | Status: DC | PRN

## 2023-04-28 MED ORDER — SODIUM CHLORIDE 0.9 % IV SOLN
10.0000 mg | Freq: Once | INTRAVENOUS | Status: AC
  Administered 2023-04-28: 10 mg via INTRAVENOUS
  Filled 2023-04-28: qty 10

## 2023-04-28 MED ORDER — ALTEPLASE 2 MG IJ SOLR
2.0000 mg | Freq: Once | INTRAMUSCULAR | Status: AC
  Administered 2023-04-28: 2 mg
  Filled 2023-04-28: qty 2

## 2023-04-28 MED ORDER — SODIUM CHLORIDE 0.9 % IV SOLN
400.0000 mg/m2 | Freq: Once | INTRAVENOUS | Status: AC
  Administered 2023-04-28: 728 mg via INTRAVENOUS
  Filled 2023-04-28: qty 25

## 2023-04-28 MED ORDER — PALONOSETRON HCL INJECTION 0.25 MG/5ML
0.2500 mg | Freq: Once | INTRAVENOUS | Status: AC
  Administered 2023-04-28: 0.25 mg via INTRAVENOUS
  Filled 2023-04-28: qty 5

## 2023-04-28 NOTE — Progress Notes (Signed)
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 -we have changed his treatment to maintenance therapy with 5-fu and vectibix in late March 2024 -He is tolerating treatment very well, will continue. -due to metastasis in both lungs and retroperitoneal lymph nodes, he is not a candidate for surgery or local therapy  -restaging CT 04/23/2023 showed stable disease    Assessment and Plan    Metastatic colon cancer -On chemotherapy FOLFIRI and Vectibix, tolerating well -I personally reviewed his restaging CT scan from April 23, 2023, which showed stable disease. -Will continue current therapy.  Pulmonary Embolism Patient has a small blood clot in the lung which was found incidentally on his restaging CT scan from April 23, 2023 -He was started on loading dose Eliquis, he is tolerating well.   -Continue current medication regimen.  Colostomy Care Patient is  managing colostomy care. -No changes to current colostomy care plan.  General Health Maintenance / Followup Plans -Return for follow-up in 2 weeks         SUMMARY OF ONCOLOGIC HISTORY: Oncology History Overview Note   Cancer Staging  Cancer of left colon [facility]) Staging form: Colon and Rectum, AJCC 8th Edition - Pathologic stage from 08/08/2021: Stage IIIB (pT3, pN1a, cM0) - Signed by Malachy Mood, MD on 08/26/2021    Cancer of left colon (HCC)  04/01/2020 Imaging   IMPRESSION: 1. Gallbladder decompressed bowel also partially calcified gallstones. No pericholecystic inflammation though if there is persisting clinical concern for cholecystitis right upper quadrant ultrasound could be obtained. 2. Circumferential thickening of the distal thoracic esophagus. Could reflect features of esophagitis. Correlate with clinical symptoms and consider endoscopy as clinically warranted. 3. Additional segmental thickening of the mid to distal sigmoid with focal narrowing. No acute surrounding inflammation or resulting obstruction. Findings are nonspecific, and could reflect sequela of prior inflammation/infection. However, recommend correlation with colonoscopy if not recently performed. 4. Mild circumferential bladder wall thickening and indentation of the bladder base by an enlarged prostate. Possibly sequela of chronic outlet obstruction though could correlate with urinalysis to exclude cystitis. 5. Aortic Atherosclerosis (ICD10-I70.0).   08/06/2021 Imaging   IMPRESSION: Sigmoid colonic perforation with small free intraperitoneal gas and infiltration of the mesenteric and omental fat in keeping with changes of peritonitis.   Long segment inflammatory stranding of the sigmoid colon in keeping with a severe infectious or inflammatory colitis. This terminates an area of irregular mural thickening, infiltrative soft tissue within the colonic mesentery, and focal dystrophic calcification. This may  represent a chronic inflammatory process, however, a perforated  malignancy could appear similarly. There are 2 separate points of perforation which again raise the question of an underlying malignancy.   7.1 cm gas and fluid containing pericolonic abscess within the sigmoid mesentery.   Marked inflammatory change of the terminal ileum adjacent to the pericolonic abscess with resultant small bowel obstruction. Fluid within the distal esophagus likely relates to gastroesophageal reflux the setting of vomiting.   Aortic Atherosclerosis (ICD10-I70.0).   08/08/2021 Cancer Staging   Staging form: Colon and Rectum, AJCC 8th Edition - Pathologic stage from 08/08/2021: Stage IIIB (pT3, pN1a, cM0) - Signed by Malachy Mood, MD on 08/26/2021 Stage prefix: Initial diagnosis Total positive nodes: 1 Histologic grading system: 4 grade system Histologic grade (G): G2 Residual tumor (R): R0 - None   08/08/2021 Definitive Surgery   FINAL MICROSCOPIC DIAGNOSIS:   A. COLON, SIGMOID, PARTIAL COLECTOMY:  - Invasive moderately differentiated adenocarcinoma.  - Metastatic carcinoma involving one of twelve lymph nodes (1/12).  - See oncology table below.   ADDENDUM:  Mismatch Repair Protein (IHC)  SUMMARY INTERPRETATION: NORMAL    08/14/2021 Imaging   EXAM: CT ABDOMEN AND PELVIS WITH CONTRAST  IMPRESSION: 1. Post recent sigmoid colectomy with left lower quadrant colostomy. Two small residual foci of air within the pelvic mesentery with mild adjacent thickening, but no abscess or drainable collection. Trace non organized free fluid and stranding in the pelvis. 2. Short segment of small bowel wall thickening and inflammation in the pelvis involving the distal ileum, likely reactive. 3. Dilated distal esophagus, stomach, and small bowel, without discrete transition point, favoring postoperative ileus. 4. Small bilateral pleural effusions and compressive atelectasis. 5. Heterogeneous partially enhancing 14 mm  lymph node in the retroperitoneum at the aortoiliac bifurcation, not significantly changed from prior exam. Suspected additional lymph nodes in the anterior common iliac space, not significantly changed from prior exam. Recommend attention at follow-up. 6. Additional chronic findings as described.     08/15/2021 Imaging   EXAM: CT CHEST WITH CONTRAST  IMPRESSION: 1. No evidence of thoracic metastasis. 2. Bilateral small layering pleural effusions with passive atelectasis   08/26/2021 Initial Diagnosis   Cancer of left colon (HCC)   10/10/2021 - 12/12/2021 Chemotherapy   Patient is on Treatment Plan : COLORECTAL Xelox (Capeox) q21d     05/28/2022 - 05/28/2022 Chemotherapy   Patient is on Treatment Plan : COLORECTAL FOLFIRI + Bevacizumab q14d     05/28/2022 -  Chemotherapy   Patient is on Treatment Plan : COLORECTAL FOLFIRI + Panitumumab q14d     07/31/2022 Imaging    IMPRESSION: Decreased bilateral pulmonary metastases.   Stable mild abdominal lymphadenopathy.   No new or progressive metastatic disease within the chest, abdomen, or pelvis.   01/18/2023 Imaging    IMPRESSION: 1. Status post Gertie Gowda pouch sigmoid colon resection with left lower quadrant end colostomy and rectal stump. Unchanged, mild wall thickening of the decompressed distal colon at the ileostomy, as well as the rectal stump. 2. Unchanged small, treated metastatic pulmonary nodules. No new nodules. 3. Unchanged enlarged, coarsely calcified treated lower aortocaval metastatic lymph nodes. No new lymphadenopathy. 4. Coronary artery disease. 5. Cholelithiasis.   04/23/2023 Imaging   CT chest, abdomen, and pelvis with contrast  IMPRESSION: 1. Left lower lobe segmental pulmonary emboli, incidental finding. No evidence for right heart strain. 2. Stable trace bilateral pleural effusions. 3. Stable enlarged lower left paraesophageal lymph node. 4. Stable calcified retroperitoneal lymph nodes. No new  enlarged lymph nodes in the chest, abdomen or pelvis. 5. Stable  subcentimeter soft tissue nodules within the mesenteric fat along the inferior ostomy. 6. Left Bosniak I benign renal cyst measuring 4.6 cm. No follow-up imaging is recommended. JACR 2018 Feb; 264-273, Management of the Incidental Renal Mass on CT, [employer] 2021; 814-848, Bosniak Classification of Cystic Renal Masses, Version 2019.      Discussed the use of AI scribe software for clinical note transcription with the patient, who gave verbal consent to proceed.  History of Present Illness   The patient, with a history of metastatic colon cancer, he is here for follow-up and treatment.  He presents to clinic with his mother.  Her mother reported lightheadedness in the lobby, but her vital sign was okay when we checked it.  She felt better when she was in my exam room.  Patient is clinically doing well, denies any new symptoms, he has been tolerating chemotherapy very well without new complaints.       All other systems were reviewed with the patient and are negative.  MEDICAL HISTORY:  Past Medical History:  Diagnosis Date   Cancer (HCC)    Schizophrenia [facility])     SURGICAL HISTORY: Past Surgical History:  Procedure Laterality Date   BRONCHIAL BIOPSY  05/11/2022   Procedure: BRONCHIAL BIOPSIES;  Surgeon: Leslye Peer, MD;  Location: [facility] ENDOSCOPY;  Service: Pulmonary;;   BRONCHIAL BRUSHINGS  05/11/2022   Procedure: BRONCHIAL BRUSHINGS;  Surgeon: Leslye Peer, MD;  Location: [facility] ENDOSCOPY;  Service: Pulmonary;;   BRONCHIAL NEEDLE ASPIRATION BIOPSY  05/11/2022   Procedure: BRONCHIAL NEEDLE ASPIRATION BIOPSIES;  Surgeon: Leslye Peer, MD;  Location: [facility] ENDOSCOPY;  Service: Pulmonary;;   BRONCHIAL WASHINGS  05/11/2022   Procedure: BRONCHIAL WASHINGS;  Surgeon: Leslye Peer, MD;  Location: [facility] ENDOSCOPY;  Service: Pulmonary;;   IR IMAGING GUIDED PORT INSERTION  05/25/2022   LAPAROTOMY N/A 08/08/2021   Procedure:  EXPLORATORY LAPAROTOMY;  Surgeon: Berna Bue, MD;  Location: [facility] OR;  Service: General;  Laterality: N/A;   PARTIAL COLECTOMY N/A 08/08/2021   Procedure: PARTIAL COLECTOMY WITH COLOSTOMY;  Surgeon: Berna Bue, MD;  Location: [facility] OR;  Service: General;  Laterality: N/A;   VIDEO BRONCHOSCOPY WITH RADIAL ENDOBRONCHIAL ULTRASOUND  05/11/2022   Procedure: VIDEO BRONCHOSCOPY WITH RADIAL ENDOBRONCHIAL ULTRASOUND;  Surgeon: Leslye Peer, MD;  Location: [facility] ENDOSCOPY;  Service: Pulmonary;;    I have reviewed the social history and family history with the patient and they are unchanged from previous note.  ALLERGIES:  has No Known Allergies.  MEDICATIONS:  Current Outpatient Medications  Medication Sig Dispense Refill   acetaminophen (TYLENOL) 500 MG tablet Take 2 tablets (1,000 mg total) by mouth every 6 (six) hours as needed for mild pain or moderate pain.  0   APIXABAN (ELIQUIS) VTE STARTER PACK (10MG  AND 5MG ) Take as directed on package: start with two-5mg  tablets twice daily for 7 days. On day 8, switch to one-5mg  tablet twice daily. 74 each 0   aspirin EC 81 MG tablet Take 81 mg by mouth daily as needed (for pain or headaches).      benztropine (COGENTIN) 1 MG tablet Take 1 mg at bedtime by mouth.     clindamycin (CLINDAGEL) 1 % gel Apply topically 2 (two) times daily. To skin rash on face and upper body 30 g 0   ferrous sulfate 325 (65 FE) MG EC tablet Take 1 tablet (325 mg total) by mouth daily. 30 tablet 3   hydrocortisone cream 1 % Apply 1 Application topically  2 (two) times daily as needed for itching. For rash 30 g 2   loperamide (IMODIUM) 2 MG capsule Take 1 capsule (2 mg total) by mouth as needed for diarrhea or loose stools. 30 capsule 0   magnesium oxide (MAG-OX) 400 (240 Mg) MG tablet Take 1 tablet (400 mg total) by mouth daily. 30 tablet 2   Multiple Vitamin (MULTIVITAMIN WITH MINERALS) TABS tablet Take 1 tablet by mouth daily.     ondansetron (ZOFRAN) 8 MG tablet Take 1  tablet (8 mg total) by mouth every 8 (eight) hours as needed for nausea or vomiting. 30 tablet 0   paliperidone (INVEGA SUSTENNA) 156 MG/ML SUSP injection Inject 156 mg every 30 (thirty) days into the muscle.     polyethylene glycol (MIRALAX / GLYCOLAX) 17 g packet Take 17 g by mouth daily as needed for mild constipation or moderate constipation.  0   potassium chloride SA (KLOR-CON M) 20 MEQ tablet TAKE 1 TABLET(20 MEQ) BY MOUTH DAILY 30 tablet 2   prochlorperazine (COMPAZINE) 10 MG tablet TAKE 1 TABLET(10 MG) BY MOUTH EVERY 6 HOURS AS NEEDED FOR NAUSEA OR VOMITING 30 tablet 0   No current facility-administered medications for this visit.   Facility-Administered Medications Ordered in Other Visits  Medication Dose Route Frequency Provider Last Rate Last Admin   fluorouracil (ADRUCIL) 4,350 mg in sodium chloride 0.9 % 63 mL chemo infusion  2,400 mg/m2 (Treatment Plan Recorded) Intravenous 1 day or 1 dose Malachy Mood, MD   Infusion Verify at 04/28/23 1619   heparin lock flush 100 unit/mL  500 Units Intracatheter Once PRN Malachy Mood, MD       sodium chloride flush (NS) 0.9 % injection 10 mL  10 mL Intracatheter PRN Malachy Mood, MD        PHYSICAL EXAMINATION: ECOG PERFORMANCE STATUS: 0 - Asymptomatic  Vitals:   04/28/23 1330  BP: 106/80  Pulse: 91  Resp: 16  Temp: 98.3 F (36.8 C)  SpO2: 100%   Wt Readings from Last 3 Encounters:  04/28/23 156 lb (70.8 kg)  03/31/23 152 lb 8 oz (69.2 kg)  03/18/23 150 lb 9.6 oz (68.3 kg)    GENERAL:alert, no distress and comfortable SKIN: skin color, texture, turgor are normal, mild skin rash on his face and neck EYES: normal, Conjunctiva are pink and non-injected, sclera clear NECK: supple, thyroid normal size, non-tender, without nodularity LYMPH:  no palpable lymphadenopathy in the cervical, axillary  LUNGS: clear to auscultation and percussion with normal breathing effort HEART: regular rate & rhythm and no murmurs and no lower extremity  edema ABDOMEN:abdomen soft, non-tender and normal bowel sounds Musculoskeletal:no cyanosis of digits and no clubbing  NEURO: alert & oriented x 3 with fluent speech, no focal motor/sensory deficits        LABORATORY DATA:  I have reviewed the data as listed    Latest Ref Rng & Units 04/28/2023   12:15 PM 04/15/2023   10:20 AM 03/31/2023   10:18 AM  CBC  WBC 4.0 - 10.5 K/uL 4.5  4.6  5.5   Hemoglobin 13.0 - 17.0 g/dL 40.9  81.1  91.4   Hematocrit 39.0 - 52.0 % 35.3  38.0  36.2   Platelets 150 - 400 K/uL 271  309  306         Latest Ref Rng & Units 04/28/2023   12:15 PM 04/15/2023   10:20 AM 03/31/2023   10:18 AM  CMP  Glucose 70 - 99 mg/dL 87  782  95   BUN 6 - 20 mg/dL 8  12  17    Creatinine 0.61 - 1.24 mg/dL 1.61  0.96  0.45   Sodium 135 - 145 mmol/L 140  140  136   Potassium 3.5 - 5.1 mmol/L 3.5  3.4  3.8   Chloride 98 - 111 mmol/L 108  108  106   CO2 22 - 32 mmol/L 25  24  25    Calcium 8.9 - 10.3 mg/dL 9.4  9.0  9.3   Total Protein 6.5 - 8.1 g/dL 6.6  6.5  6.7   Total Bilirubin 0.3 - 1.2 mg/dL 0.5  0.5  0.6   Alkaline Phos 38 - 126 U/L 113  107  112   AST 15 - 41 U/L 19  19  22    ALT 0 - 44 U/L 24  23  31        RADIOGRAPHIC STUDIES: I have personally reviewed the radiological images as listed and agreed with the findings in the report. No results found.    Orders Placed This Encounter  Procedures   CBC with Differential (Cancer Center Only)    Standing Status:   Future    Standing Expiration Date:   06/09/2024   CMP (Cancer Center only)    Standing Status:   Future    Standing Expiration Date:   06/09/2024   Magnesium    Standing Status:   Future    Standing Expiration Date:   06/09/2024   All questions were answered. The patient knows to call the clinic with any problems, questions or concerns. No barriers to learning was detected. The total time spent in the appointment was 30 minutes.     Malachy Mood, MD 04/28/2023

## 2023-04-28 NOTE — Patient Instructions (Signed)
[place] [facility]  Discharge Instructions: Thank you for choosing [facility] to provide your oncology and hematology care.   If you have a lab appointment with the Cancer Center, please go directly to the Cancer Center and check in at the registration area.   Wear comfortable clothing and clothing appropriate for easy access to any Portacath or PICC line.   We strive to give you quality time with your provider. You may need to reschedule your appointment if you arrive late (15 or more minutes).  Arriving late affects you and other patients whose appointments are after yours.  Also, if you miss three or more appointments without notifying the office, you may be dismissed from the clinic at the provider's discretion.      For prescription refill requests, have your pharmacy contact our office and allow 72 hours for refills to be completed.    Today you received the following chemotherapy and/or immunotherapy agents: Vectibix, Leucovorin, 5FU      To help prevent nausea and vomiting after your treatment, we encourage you to take your nausea medication as directed.  BELOW ARE SYMPTOMS THAT SHOULD BE REPORTED IMMEDIATELY: *FEVER GREATER THAN 100.4 F (38 C) OR HIGHER *CHILLS OR SWEATING *NAUSEA AND VOMITING THAT IS NOT CONTROLLED WITH YOUR NAUSEA MEDICATION *UNUSUAL SHORTNESS OF BREATH *UNUSUAL BRUISING OR BLEEDING *URINARY PROBLEMS (pain or burning when urinating, or frequent urination) *BOWEL PROBLEMS (unusual diarrhea, constipation, pain near the anus) TENDERNESS IN MOUTH AND THROAT WITH OR WITHOUT PRESENCE OF ULCERS (sore throat, sores in mouth, or a toothache) UNUSUAL RASH, SWELLING OR PAIN  UNUSUAL VAGINAL DISCHARGE OR ITCHING   Items with * indicate a potential emergency and should be followed up as soon as possible or go to the Emergency Department if any problems should occur.  Please show the CHEMOTHERAPY ALERT CARD or IMMUNOTHERAPY  ALERT CARD at check-in to the Emergency Department and triage nurse.  Should you have questions after your visit or need to cancel or reschedule your appointment, please contact [place] [facility]  Dept: [phone]  and follow the prompts.  Office hours are 8:00 a.m. to 4:30 p.m. Monday - Friday. Please note that voicemails left after 4:00 p.m. may not be returned until the following business day.  We are closed weekends and major holidays. You have access to a nurse at all times for urgent questions. Please call the main number to the clinic Dept: [phone] and follow the prompts.   For any non-urgent questions, you may also contact your provider using MyChart. We now offer e-Visits for anyone 30 and older to request care online for non-urgent symptoms. For details visit [email or web address].   Also download the MyChart app! Go to the app store, search "MyChart", open the app, select [place], and log in with your MyChart username and password.

## 2023-04-28 NOTE — Assessment & Plan Note (Signed)
stage IIIB p(T3, N1aM0), MSS, KRAS/NRAS/BRAF wild type, lung and node metastasis in 04/2022  -Initially diagnosed in 08/2021 -he completed 4 cycles adjuvant CAPOX 10/10/21 - 12/25/21 (though he somehow was still taking Xeloda through ~02/09/22 due to misunderstandings).  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 -we have changed his treatment to maintenance therapy with 5-fu and vectibix in late March 2024 -He is tolerating treatment very well, will continue. -due to metastasis in both lungs and retroperitoneal lymph nodes, he is not a candidate for surgery or local therapy  -restaging CT 04/23/2023 showed stable disease

## 2023-04-30 ENCOUNTER — Other Ambulatory Visit: Payer: Self-pay

## 2023-04-30 ENCOUNTER — Inpatient Hospital Stay: Payer: MEDICAID

## 2023-04-30 VITALS — BP 102/70 | HR 100 | Temp 98.7°F | Resp 16

## 2023-04-30 DIAGNOSIS — C186 Malignant neoplasm of descending colon: Secondary | ICD-10-CM

## 2023-04-30 DIAGNOSIS — Z5111 Encounter for antineoplastic chemotherapy: Secondary | ICD-10-CM | POA: Diagnosis not present

## 2023-04-30 MED ORDER — HEPARIN SOD (PORK) LOCK FLUSH 100 UNIT/ML IV SOLN
500.0000 [IU] | Freq: Once | INTRAVENOUS | Status: AC | PRN
  Administered 2023-04-30: 500 [IU]

## 2023-04-30 MED ORDER — SODIUM CHLORIDE 0.9% FLUSH
10.0000 mL | INTRAVENOUS | Status: DC | PRN
  Administered 2023-04-30: 10 mL

## 2023-05-07 ENCOUNTER — Telehealth: Payer: Self-pay | Admitting: Hematology

## 2023-05-12 MED FILL — Dexamethasone Sodium Phosphate Inj 100 MG/10ML: INTRAMUSCULAR | Qty: 1 | Status: AC

## 2023-05-13 ENCOUNTER — Inpatient Hospital Stay (HOSPITAL_BASED_OUTPATIENT_CLINIC_OR_DEPARTMENT_OTHER): Payer: MEDICAID | Admitting: Nurse Practitioner

## 2023-05-13 ENCOUNTER — Inpatient Hospital Stay: Payer: MEDICAID

## 2023-05-13 ENCOUNTER — Encounter: Payer: Self-pay | Admitting: Nurse Practitioner

## 2023-05-13 ENCOUNTER — Inpatient Hospital Stay: Payer: MEDICAID | Attending: Hematology

## 2023-05-13 VITALS — HR 86

## 2023-05-13 DIAGNOSIS — Z79899 Other long term (current) drug therapy: Secondary | ICD-10-CM | POA: Diagnosis not present

## 2023-05-13 DIAGNOSIS — Z5112 Encounter for antineoplastic immunotherapy: Secondary | ICD-10-CM | POA: Insufficient documentation

## 2023-05-13 DIAGNOSIS — Z5111 Encounter for antineoplastic chemotherapy: Secondary | ICD-10-CM | POA: Diagnosis present

## 2023-05-13 DIAGNOSIS — C189 Malignant neoplasm of colon, unspecified: Secondary | ICD-10-CM

## 2023-05-13 DIAGNOSIS — C186 Malignant neoplasm of descending colon: Secondary | ICD-10-CM | POA: Insufficient documentation

## 2023-05-13 DIAGNOSIS — Z95828 Presence of other vascular implants and grafts: Secondary | ICD-10-CM

## 2023-05-13 LAB — CMP (CANCER CENTER ONLY)
ALT: 10 U/L (ref 0–44)
AST: 14 U/L — ABNORMAL LOW (ref 15–41)
Albumin: 3.9 g/dL (ref 3.5–5.0)
Alkaline Phosphatase: 82 U/L (ref 38–126)
Anion gap: 7 (ref 5–15)
BUN: 12 mg/dL (ref 6–20)
CO2: 24 mmol/L (ref 22–32)
Calcium: 9.1 mg/dL (ref 8.9–10.3)
Chloride: 108 mmol/L (ref 98–111)
Creatinine: 0.75 mg/dL (ref 0.61–1.24)
GFR, Estimated: >60 mL/min (ref >60)
Glucose, Bld: 86 mg/dL (ref 70–99)
Potassium: 3.4 mmol/L — ABNORMAL LOW (ref 3.5–5.1)
Sodium: 139 mmol/L (ref 135–145)
Total Bilirubin: 0.5 mg/dL (ref 0.3–1.2)
Total Protein: 6.6 g/dL (ref 6.5–8.1)

## 2023-05-13 LAB — CBC WITH DIFFERENTIAL (CANCER CENTER ONLY)
Abs Immature Granulocytes: 0.02 10*3/uL (ref 0.00–0.07)
Basophils Absolute: 0 10*3/uL (ref 0.0–0.1)
Basophils Relative: 1 %
Eosinophils Absolute: 0.1 10*3/uL (ref 0.0–0.5)
Eosinophils Relative: 2 %
HCT: 35.8 % — ABNORMAL LOW (ref 39.0–52.0)
Hemoglobin: 11.7 g/dL — ABNORMAL LOW (ref 13.0–17.0)
Immature Granulocytes: 0 %
Lymphocytes Relative: 19 %
Lymphs Abs: 1 10*3/uL (ref 0.7–4.0)
MCH: 26.8 pg (ref 26.0–34.0)
MCHC: 32.7 g/dL (ref 30.0–36.0)
MCV: 81.9 fL (ref 80.0–100.0)
Monocytes Absolute: 0.4 10*3/uL (ref 0.1–1.0)
Monocytes Relative: 9 %
Neutro Abs: 3.4 10*3/uL (ref 1.7–7.7)
Neutrophils Relative %: 69 %
Platelet Count: 332 10*3/uL (ref 150–400)
RBC: 4.37 MIL/uL (ref 4.22–5.81)
RDW: 17.9 % — ABNORMAL HIGH (ref 11.5–15.5)
WBC Count: 5 10*3/uL (ref 4.0–10.5)
nRBC: 0 % (ref 0.0–0.2)

## 2023-05-13 LAB — MAGNESIUM: Magnesium: 1.7 mg/dL (ref 1.7–2.4)

## 2023-05-13 MED ORDER — SODIUM CHLORIDE 0.9% FLUSH
10.0000 mL | Freq: Once | INTRAVENOUS | Status: AC
  Administered 2023-05-13: 10 mL

## 2023-05-13 MED ORDER — SODIUM CHLORIDE 0.9% FLUSH: 10.0000 mL | INTRAVENOUS | Status: DC | PRN

## 2023-05-13 MED ORDER — SODIUM CHLORIDE 0.9 % IV SOLN
6.0000 mg/kg | Freq: Once | INTRAVENOUS | Status: AC
  Administered 2023-05-13: 400 mg via INTRAVENOUS
  Filled 2023-05-13: qty 20

## 2023-05-13 MED ORDER — SODIUM CHLORIDE 0.9 % IV SOLN: Freq: Once | INTRAVENOUS | Status: AC

## 2023-05-13 MED ORDER — SODIUM CHLORIDE 0.9 % IV SOLN
10.0000 mg | Freq: Once | INTRAVENOUS | Status: AC
  Administered 2023-05-13: 10 mg via INTRAVENOUS
  Filled 2023-05-13: qty 10

## 2023-05-13 MED ORDER — HEPARIN SOD (PORK) LOCK FLUSH 100 UNIT/ML IV SOLN: 500.0000 [IU] | Freq: Once | INTRAVENOUS | Status: DC | PRN

## 2023-05-13 MED ORDER — PALONOSETRON HCL INJECTION 0.25 MG/5ML
0.2500 mg | Freq: Once | INTRAVENOUS | Status: AC
  Administered 2023-05-13: 0.25 mg via INTRAVENOUS
  Filled 2023-05-13: qty 5

## 2023-05-13 MED ORDER — SODIUM CHLORIDE 0.9 % IV SOLN
2400.0000 mg/m2 | INTRAVENOUS | Status: DC
  Administered 2023-05-13: 4350 mg via INTRAVENOUS
  Filled 2023-05-13: qty 87

## 2023-05-13 MED ORDER — SODIUM CHLORIDE 0.9 % IV SOLN
400.0000 mg/m2 | Freq: Once | INTRAVENOUS | Status: AC
  Administered 2023-05-13: 728 mg via INTRAVENOUS
  Filled 2023-05-13: qty 36.4

## 2023-05-13 NOTE — Progress Notes (Signed)
Patient Care Team: [facility] as PCP - General Berna Bue, MD as Consulting Physician (General Surgery) Malachy Mood, MD as Consulting Physician (Oncology)   CHIEF COMPLAINT: Follow up metastatic colon cancer  Oncology History Overview Note   Cancer Staging  Cancer of left colon [facility]) Staging form: Colon and Rectum, AJCC 8th Edition - Pathologic stage from 08/08/2021: Stage IIIB (pT3, pN1a, cM0) - Signed by Malachy Mood, MD on 08/26/2021    Cancer of left colon (HCC)  04/01/2020 Imaging   IMPRESSION: 1. Gallbladder decompressed bowel also partially calcified gallstones. No pericholecystic inflammation though if there is persisting clinical concern for cholecystitis right upper quadrant ultrasound could be obtained. 2. Circumferential thickening of the distal thoracic esophagus. Could reflect features of esophagitis. Correlate with clinical symptoms and consider endoscopy as clinically warranted. 3. Additional segmental thickening of the mid to distal sigmoid with focal narrowing. No acute surrounding inflammation or resulting obstruction. Findings are nonspecific, and could reflect sequela of prior inflammation/infection. However, recommend correlation with colonoscopy if not recently performed. 4. Mild circumferential bladder wall thickening and indentation of the bladder base by an enlarged prostate. Possibly sequela of chronic outlet obstruction though could correlate with urinalysis to exclude cystitis. 5. Aortic Atherosclerosis (ICD10-I70.0).   08/06/2021 Imaging   IMPRESSION: Sigmoid colonic perforation with small free intraperitoneal gas and infiltration of the mesenteric and omental fat in keeping with changes of peritonitis.   Long segment inflammatory stranding of the sigmoid colon in keeping with a severe infectious or inflammatory colitis. This terminates an area of irregular mural thickening, infiltrative soft tissue within the colonic  mesentery, and focal dystrophic calcification. This may represent a chronic inflammatory process, however, a perforated malignancy could appear similarly. There are 2 separate points of perforation which again raise the question of an underlying malignancy.   7.1 cm gas and fluid containing pericolonic abscess within the sigmoid mesentery.   Marked inflammatory change of the terminal ileum adjacent to the pericolonic abscess with resultant small bowel obstruction. Fluid within the distal esophagus likely relates to gastroesophageal reflux the setting of vomiting.   Aortic Atherosclerosis (ICD10-I70.0).   08/08/2021 Cancer Staging   Staging form: Colon and Rectum, AJCC 8th Edition - Pathologic stage from 08/08/2021: Stage IIIB (pT3, pN1a, cM0) - Signed by Malachy Mood, MD on 08/26/2021 Stage prefix: Initial diagnosis Total positive nodes: 1 Histologic grading system: 4 grade system Histologic grade (G): G2 Residual tumor (R): R0 - None   08/08/2021 Definitive Surgery   FINAL MICROSCOPIC DIAGNOSIS:   A. COLON, SIGMOID, PARTIAL COLECTOMY:  - Invasive moderately differentiated adenocarcinoma.  - Metastatic carcinoma involving one of twelve lymph nodes (1/12).  - See oncology table below.   ADDENDUM:  Mismatch Repair Protein (IHC)  SUMMARY INTERPRETATION: NORMAL    08/14/2021 Imaging   EXAM: CT ABDOMEN AND PELVIS WITH CONTRAST  IMPRESSION: 1. Post recent sigmoid colectomy with left lower quadrant colostomy. Two small residual foci of air within the pelvic mesentery with mild adjacent thickening, but no abscess or drainable collection. Trace non organized free fluid and stranding in the pelvis. 2. Short segment of small bowel wall thickening and inflammation in the pelvis involving the distal ileum, likely reactive. 3. Dilated distal esophagus, stomach, and small bowel, without discrete transition point, favoring postoperative ileus. 4. Small bilateral pleural effusions and compressive  atelectasis. 5. Heterogeneous partially enhancing 14 mm lymph node in the retroperitoneum at the aortoiliac bifurcation, not significantly changed from prior exam. Suspected additional lymph nodes  in the anterior common iliac space, not significantly changed from prior exam. Recommend attention at follow-up. 6. Additional chronic findings as described.     08/15/2021 Imaging   EXAM: CT CHEST WITH CONTRAST  IMPRESSION: 1. No evidence of thoracic metastasis. 2. Bilateral small layering pleural effusions with passive atelectasis   08/26/2021 Initial Diagnosis   Cancer of left colon (HCC)   10/10/2021 - 12/12/2021 Chemotherapy   Patient is on Treatment Plan : COLORECTAL Xelox (Capeox) q21d     05/28/2022 - 05/28/2022 Chemotherapy   Patient is on Treatment Plan : COLORECTAL FOLFIRI + Bevacizumab q14d     05/28/2022 -  Chemotherapy   Patient is on Treatment Plan : COLORECTAL FOLFIRI + Panitumumab q14d     07/31/2022 Imaging    IMPRESSION: Decreased bilateral pulmonary metastases.   Stable mild abdominal lymphadenopathy.   No new or progressive metastatic disease within the chest, abdomen, or pelvis.   01/18/2023 Imaging    IMPRESSION: 1. Status post Gertie Gowda pouch sigmoid colon resection with left lower quadrant end colostomy and rectal stump. Unchanged, mild wall thickening of the decompressed distal colon at the ileostomy, as well as the rectal stump. 2. Unchanged small, treated metastatic pulmonary nodules. No new nodules. 3. Unchanged enlarged, coarsely calcified treated lower aortocaval metastatic lymph nodes. No new lymphadenopathy. 4. Coronary artery disease. 5. Cholelithiasis.   04/23/2023 Imaging   CT chest, abdomen, and pelvis with contrast  IMPRESSION: 1. Left lower lobe segmental pulmonary emboli, incidental finding. No evidence for right heart strain. 2. Stable trace bilateral pleural effusions. 3. Stable enlarged lower left paraesophageal lymph node. 4.  Stable calcified retroperitoneal lymph nodes. No new enlarged lymph nodes in the chest, abdomen or pelvis. 5. Stable subcentimeter soft tissue nodules within the mesenteric fat along the inferior ostomy. 6. Left Bosniak I benign renal cyst measuring 4.6 cm. No follow-up imaging is recommended. JACR 2018 Feb; 264-273, Management of the Incidental Renal Mass on CT, [employer] 2021; 814-848, Bosniak Classification of Cystic Renal Masses, Version 2019.      CURRENT THERAPY: Second line FOLFIRI/panitumumab q2 weeks, starting 05/08/22, changed to maintenance 5FU/leuc and panitumumab in 10/2022  INTERVAL HISTORY Matthew [name] returns for follow up as scheduled. Last seen by Dr. Mosetta Putt 04/28/23 with another cycle of maintenance 5FU/vectibix.  Tolerating treatment well.  Bowel movements are normal, he is eating and drinking.  Denies pain, rash, or any other new or specific complaints.  ROS  All other systems reviewed and negative  Past Medical History:  Diagnosis Date   Cancer (HCC)    Schizophrenia [facility])      Past Surgical History:  Procedure Laterality Date   BRONCHIAL BIOPSY  05/11/2022   Procedure: BRONCHIAL BIOPSIES;  Surgeon: Leslye Peer, MD;  Location: [facility] ENDOSCOPY;  Service: Pulmonary;;   BRONCHIAL BRUSHINGS  05/11/2022   Procedure: BRONCHIAL BRUSHINGS;  Surgeon: Leslye Peer, MD;  Location: [facility] ENDOSCOPY;  Service: Pulmonary;;   BRONCHIAL NEEDLE ASPIRATION BIOPSY  05/11/2022   Procedure: BRONCHIAL NEEDLE ASPIRATION BIOPSIES;  Surgeon: Leslye Peer, MD;  Location: [facility] ENDOSCOPY;  Service: Pulmonary;;   BRONCHIAL WASHINGS  05/11/2022   Procedure: BRONCHIAL WASHINGS;  Surgeon: Leslye Peer, MD;  Location: [facility] ENDOSCOPY;  Service: Pulmonary;;   IR IMAGING GUIDED PORT INSERTION  05/25/2022   LAPAROTOMY N/A 08/08/2021   Procedure: EXPLORATORY LAPAROTOMY;  Surgeon: Berna Bue, MD;  Location: [facility] OR;  Service: General;  Laterality: N/A;   PARTIAL COLECTOMY N/A 08/08/2021   Procedure:  PARTIAL COLECTOMY WITH  COLOSTOMY;  Surgeon: Berna Bue, MD;  Location: [facility] OR;  Service: General;  Laterality: N/A;   VIDEO BRONCHOSCOPY WITH RADIAL ENDOBRONCHIAL ULTRASOUND  05/11/2022   Procedure: VIDEO BRONCHOSCOPY WITH RADIAL ENDOBRONCHIAL ULTRASOUND;  Surgeon: Leslye Peer, MD;  Location: [facility] ENDOSCOPY;  Service: Pulmonary;;     Outpatient Encounter Medications as of 05/13/2023  Medication Sig Note   acetaminophen (TYLENOL) 500 MG tablet Take 2 tablets (1,000 mg total) by mouth every 6 (six) hours as needed for mild pain or moderate pain.    APIXABAN (ELIQUIS) VTE STARTER PACK (10MG  AND 5MG ) Take as directed on package: start with two-5mg  tablets twice daily for 7 days. On day 8, switch to one-5mg  tablet twice daily.    aspirin EC 81 MG tablet Take 81 mg by mouth daily as needed (for pain or headaches).     benztropine (COGENTIN) 1 MG tablet Take 1 mg at bedtime by mouth.    clindamycin (CLINDAGEL) 1 % gel Apply topically 2 (two) times daily. To skin rash on face and upper body    ferrous sulfate 325 (65 FE) MG EC tablet Take 1 tablet (325 mg total) by mouth daily.    hydrocortisone cream 1 % Apply 1 Application topically 2 (two) times daily as needed for itching. For rash    loperamide (IMODIUM) 2 MG capsule Take 1 capsule (2 mg total) by mouth as needed for diarrhea or loose stools.    magnesium oxide (MAG-OX) 400 (240 Mg) MG tablet Take 1 tablet (400 mg total) by mouth daily.    Multiple Vitamin (MULTIVITAMIN WITH MINERALS) TABS tablet Take 1 tablet by mouth daily.    ondansetron (ZOFRAN) 8 MG tablet Take 1 tablet (8 mg total) by mouth every 8 (eight) hours as needed for nausea or vomiting.    paliperidone (INVEGA SUSTENNA) 156 MG/ML SUSP injection Inject 156 mg every 30 (thirty) days into the muscle. 05/25/2022: Due tomorrow   polyethylene glycol (MIRALAX / GLYCOLAX) 17 g packet Take 17 g by mouth daily as needed for mild constipation or moderate constipation.    potassium  chloride SA (KLOR-CON M) 20 MEQ tablet TAKE 1 TABLET(20 MEQ) BY MOUTH DAILY    prochlorperazine (COMPAZINE) 10 MG tablet TAKE 1 TABLET(10 MG) BY MOUTH EVERY 6 HOURS AS NEEDED FOR NAUSEA OR VOMITING    No facility-administered encounter medications on file as of 05/13/2023.     Today's Vitals   05/13/23 1121  BP: 112/75  Pulse: (!) 102  Resp: 18  Temp: 97.7 F (36.5 C)  TempSrc: Tympanic  SpO2: 98%  Weight: 153 lb 7 oz (69.6 kg)   Body mass index is 22.66 kg/m.   PHYSICAL EXAM GENERAL:alert, no distress and comfortable SKIN: no rash  EYES: sclera clear  LUNGS: normal breathing effort NEURO: alert & oriented x 3 with fluent speech, no focal motor deficits PAC without erythema    CBC    Component Value Date/Time   WBC 5.0 05/13/2023 1058   WBC 5.2 05/11/2022 0801   RBC 4.37 05/13/2023 1058   HGB 11.7 (L) 05/13/2023 1058   HCT 35.8 (L) 05/13/2023 1058   PLT 332 05/13/2023 1058   MCV 81.9 05/13/2023 1058   MCH 26.8 05/13/2023 1058   MCHC 32.7 05/13/2023 1058   RDW 17.9 (H) 05/13/2023 1058   LYMPHSABS 1.0 05/13/2023 1058   MONOABS 0.4 05/13/2023 1058   EOSABS 0.1 05/13/2023 1058   BASOSABS 0.0 05/13/2023 1058     CMP  Component Value Date/Time   NA 140 04/28/2023 1215   K 3.5 04/28/2023 1215   CL 108 04/28/2023 1215   CO2 25 04/28/2023 1215   GLUCOSE 87 04/28/2023 1215   BUN 8 04/28/2023 1215   CREATININE 0.82 04/28/2023 1215   CALCIUM 9.4 04/28/2023 1215   PROT 6.6 04/28/2023 1215   ALBUMIN 3.9 04/28/2023 1215   AST 19 04/28/2023 1215   ALT 24 04/28/2023 1215   ALKPHOS 113 04/28/2023 1215   BILITOT 0.5 04/28/2023 1215   GFRNONAA >60 04/28/2023 1215   GFRAA >60 04/01/2020 1237     ASSESSMENT & PLAN:Matthew [name] is a 57 y.o. male with    1. Cancer of left colon, stage IIIB p(T3, N1aM0), MSS -presented to ED on 08/06/21 with sigmoid colon perforation, s/p emergent partial colectomy on 08/08/21 by Dr. Doylene Canard showed invasive moderately differentiated  adenocarcinoma. Margins negative, one lymph node showed metastatic carcinoma (1/12). -Staging chest CT 08/15/21 was negative for metastatic disease. -he completed 4 cycles adjuvant CAPOX on 10/10/21 - May or July 2023 -Surveillance scan 01/2022 showed pulmonary nodules and retroperitoneal adenopathy, concerning for recurrent/metastatic disease -Lung biopsy confirmed malignant cells consistent with colonic primary -He began first-line FOLFIRI in 05/2022, Vectibix added with cycle 2.  Tolerated well with good response on CT from 07/31/2022 and again with continued response in lungs and lymph nodes on CT CAP from 10/26/2022.   -Changed to maintenance 5FU/leuc and vectibix on 10/28/22 -Restaging CT 04/23/2023 showed stable disease -Continues maintenance 5-FU/leuc and panitumumab every 2 weeks, tolerating well without significant side effects.  He is able to recover and maintain good performance status.  There is no clinical evidence of disease progression -Labs reviewed, adequate to proceed with another cycle of 5-FU/leuc and panitumumab today as scheduled  2.  Pulmonary emboli -Incidental finding on restaging CT 04/23/2023.  Asymptomatic -On Eliquis, tolerating well   3. Anemia, likely iron deficient -chronic prior to diagnosis, worsened with surgery -he is currently on oral iron. -overall mild and stable    4. Schizophrenia, disabled, social support -he was diagnosed with schizophrenia in his 20's, managed with medication, he is on disability. -he lives with his mother; he is able to do ADLs for himself, but not iADLS. They are both on disability.     PLAN: -Labs reviewed -Proceed with maintenance 5-FU/leuc and panitumumab today as scheduled -Continue oral K and Mag once daily -F/up and next cycle in 2 weeks   All questions were answered. The patient knows to call the clinic with any problems, questions or concerns. No barriers to learning were detected.   Santiago Glad, NP-C 05/13/2023

## 2023-05-13 NOTE — Patient Instructions (Signed)
[facility]  Discharge Instructions: Thank you for choosing [facility] to provide your oncology and hematology care.   If you have a lab appointment with the Cancer Center, please go directly to the Cancer Center and check in at the registration area.   Wear comfortable clothing and clothing appropriate for easy access to any Portacath or PICC line.   We strive to give you quality time with your provider. You may need to reschedule your appointment if you arrive late (15 or more minutes).  Arriving late affects you and other patients whose appointments are after yours.  Also, if you miss three or more appointments without notifying the office, you may be dismissed from the clinic at the provider's discretion.      For prescription refill requests, have your pharmacy contact our office and allow 72 hours for refills to be completed.    Today you received the following chemotherapy and/or immunotherapy agents: Vectibix, Leucovorin, Fluorouracil.       To help prevent nausea and vomiting after your treatment, we encourage you to take your nausea medication as directed.  BELOW ARE SYMPTOMS THAT SHOULD BE REPORTED IMMEDIATELY: *FEVER GREATER THAN 100.4 F (38 C) OR HIGHER *CHILLS OR SWEATING *NAUSEA AND VOMITING THAT IS NOT CONTROLLED WITH YOUR NAUSEA MEDICATION *UNUSUAL SHORTNESS OF BREATH *UNUSUAL BRUISING OR BLEEDING *URINARY PROBLEMS (pain or burning when urinating, or frequent urination) *BOWEL PROBLEMS (unusual diarrhea, constipation, pain near the anus) TENDERNESS IN MOUTH AND THROAT WITH OR WITHOUT PRESENCE OF ULCERS (sore throat, sores in mouth, or a toothache) UNUSUAL RASH, SWELLING OR PAIN  UNUSUAL VAGINAL DISCHARGE OR ITCHING   Items with * indicate a potential emergency and should be followed up as soon as possible or go to the Emergency Department if any problems should occur.  Please show the CHEMOTHERAPY ALERT CARD or  IMMUNOTHERAPY ALERT CARD at check-in to the Emergency Department and triage nurse.  Should you have questions after your visit or need to cancel or reschedule your appointment, please contact [facility]  Dept: [phone]  and follow the prompts.  Office hours are 8:00 a.m. to 4:30 p.m. Monday - Friday. Please note that voicemails left after 4:00 p.m. may not be returned until the following business day.  We are closed weekends and major holidays. You have access to a nurse at all times for urgent questions. Please call the main number to the clinic Dept: [phone] and follow the prompts.   For any non-urgent questions, you may also contact your provider using MyChart. We now offer e-Visits for anyone 110 and older to request care online for non-urgent symptoms. For details visit [email or web address].   Also download the MyChart app! Go to the app store, search "MyChart", open the app, select [place], and log in with your MyChart username and password.

## 2023-05-15 ENCOUNTER — Inpatient Hospital Stay: Payer: MEDICAID

## 2023-05-15 VITALS — BP 95/68 | HR 111 | Temp 97.3°F | Resp 17

## 2023-05-15 DIAGNOSIS — Z5111 Encounter for antineoplastic chemotherapy: Secondary | ICD-10-CM | POA: Diagnosis not present

## 2023-05-15 DIAGNOSIS — C186 Malignant neoplasm of descending colon: Secondary | ICD-10-CM

## 2023-05-15 MED ORDER — HEPARIN SOD (PORK) LOCK FLUSH 100 UNIT/ML IV SOLN
500.0000 [IU] | Freq: Once | INTRAVENOUS | Status: AC | PRN
  Administered 2023-05-15: 500 [IU]

## 2023-05-15 MED ORDER — SODIUM CHLORIDE 0.9% FLUSH
10.0000 mL | INTRAVENOUS | Status: DC | PRN
  Administered 2023-05-15: 10 mL

## 2023-05-20 ENCOUNTER — Other Ambulatory Visit: Payer: Self-pay | Admitting: Physician Assistant

## 2023-05-25 NOTE — Progress Notes (Unsigned)
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 -we have changed his treatment to maintenance therapy with 5-fu and vectibix in late March 2024 -He is tolerating treatment very well, will continue. -due to metastasis in both lungs and retroperitoneal lymph nodes, he is not a candidate for surgery or local therapy  -restaging CT 04/23/2023 showed stable disease    Plan: Labs reviewed  -CBC showing WBC 4.5; Hgb 11.5; Hct 37.5; Plt 361; Anc 3.0 -CMP - K 3.4; glucose 91; BUN 17; Creatinine 0.71; eGFR >60; Ca 9.1; LFTs normal.   -Magnesium: 1.6 --replenish magnesium with 2 gm mag IV --recommend increased oral mag supplement to twice daily.  Renew eliquis at 5 mg daily and discussed with patient and his mother, correct administration of medication. Printed instructions will be on medication bottle.  Labs/flush, follow up, and infusion in 3 weeks as currently scheduled.   The patient understands the plans discussed today and is in agreement with them.  He knows to contact our office if he develops concerns prior to his next appointment.  I  provided 30 minutes of face-to-face time during this encounter and > 50% was spent counseling as documented under my assessment and plan.    Carlean Jews, NP  [facility] [address] [place] [address] Dept: [phone] Dept Fax: [phone]   No orders of the defined types were placed in this encounter.     CHIEF COMPLAINT:  CC: cancer of left colon   Current Treatment:  5-fu pump and vectibix q14 days maintenance (Cycle 26 day1)  INTERVAL HISTORY:  Matthew [name] is here today for repeat clinical assessment. He denies fevers or chills. He denies pain. His appetite is good. His weight has been stable. Last saw Matthew Heron, NP on 05/13/2023. Mother states that eliquis prescription has run out prior to today's visit. She and patient are unsure how long he will have to be on this medication. Reviewed medication is to prevent further blood clot formation. He should be taking 5 mg tablet twice daily.  He is currently on maintenance therapy with 5-fu pump and vectibix. He is tolerating well. He denies chest pain, chest pressure, or shortness of breath. He denies headaches or visual disturbances. He denies abdominal pain, nausea, vomiting, or changes in bowel or bladder habits.    I have reviewed the past medical history, past surgical history, social history and family history with the patient and they are unchanged from previous note.  ALLERGIES:  has No Known Allergies.  MEDICATIONS:  Current Outpatient Medications  Medication Sig Dispense Refill   acetaminophen (TYLENOL) 500 MG tablet Take 2 tablets (1,000 mg total) by mouth every  6 (six) hours as needed for mild pain or moderate pain.  0   aspirin EC 81 MG tablet Take 81 mg by mouth daily as needed (for pain or headaches).      benztropine (COGENTIN) 1 MG tablet Take 1 mg at bedtime by mouth.     clindamycin (CLINDAGEL) 1 % gel Apply topically 2 (two) times daily. To  skin rash on face and upper body 30 g 0   ferrous sulfate 325 (65 FE) MG EC tablet Take 1 tablet (325 mg total) by mouth daily. 30 tablet 3   hydrocortisone cream 1 % Apply 1 Application topically 2 (two) times daily as needed for itching. For rash 30 g 2   loperamide (IMODIUM) 2 MG capsule Take 1 capsule (2 mg total) by mouth as needed for diarrhea or loose stools. 30 capsule 0   magnesium oxide (MAG-OX) 400 (240 Mg) MG tablet Take 1 tablet (400 mg total) by mouth daily. 30 tablet 2   Multiple Vitamin (MULTIVITAMIN WITH MINERALS) TABS tablet Take 1 tablet by mouth daily.     ondansetron (ZOFRAN) 8 MG tablet Take 1 tablet (8 mg total) by mouth every 8 (eight) hours as needed for nausea or vomiting. 30 tablet 0   paliperidone (INVEGA SUSTENNA) 156 MG/ML SUSP injection Inject 156 mg every 30 (thirty) days into the muscle.     polyethylene glycol (MIRALAX / GLYCOLAX) 17 g packet Take 17 g by mouth daily as needed for mild constipation or moderate constipation.  0   potassium chloride SA (KLOR-CON M) 20 MEQ tablet TAKE 1 TABLET(20 MEQ) BY MOUTH DAILY 30 tablet 2   prochlorperazine (COMPAZINE) 10 MG tablet TAKE 1 TABLET(10 MG) BY MOUTH EVERY 6 HOURS AS NEEDED FOR NAUSEA OR VOMITING 30 tablet 0   apixaban (ELIQUIS) 5 MG TABS tablet Take 1 tablet (5 mg total) by mouth 2 (two) times daily. 60 tablet 1   No current facility-administered medications for this visit.   Facility-Administered Medications Ordered in Other Visits  Medication Dose Route Frequency Provider Last Rate Last Admin   fluorouracil (ADRUCIL) 4,350 mg in sodium chloride 0.9 % 63 mL chemo infusion  2,400 mg/m2 (Treatment Plan Recorded) Intravenous 1 day or 1 dose Malachy Mood, MD   Infusion Verify at 05/26/23 1320   heparin lock flush 100 unit/mL  500 Units Intracatheter Once PRN Malachy Mood, MD       sodium chloride flush (NS) 0.9 % injection 10 mL  10 mL Intracatheter PRN Malachy Mood, MD        HISTORY OF PRESENT ILLNESS:   Oncology History  Overview Note   Cancer Staging  Cancer of left colon [facility]) Staging form: Colon and Rectum, AJCC 8th Edition - Pathologic stage from 08/08/2021: Stage IIIB (pT3, pN1a, cM0) - Signed by Malachy Mood, MD on 08/26/2021    Cancer of left colon (HCC)  04/01/2020 Imaging   IMPRESSION: 1. Gallbladder decompressed bowel also partially calcified gallstones. No pericholecystic inflammation though if there is persisting clinical concern for cholecystitis right upper quadrant ultrasound could be obtained. 2. Circumferential thickening of the distal thoracic esophagus. Could reflect features of esophagitis. Correlate with clinical symptoms and consider endoscopy as clinically warranted. 3. Additional segmental thickening of the mid to distal sigmoid with focal narrowing. No acute surrounding inflammation or resulting obstruction. Findings are nonspecific, and could reflect sequela of prior inflammation/infection. However, recommend correlation with colonoscopy if not recently performed. 4. Mild circumferential bladder wall thickening and indentation of the bladder base  by an enlarged prostate. Possibly sequela of chronic outlet obstruction though could correlate with urinalysis to exclude cystitis. 5. Aortic Atherosclerosis (ICD10-I70.0).   08/06/2021 Imaging   IMPRESSION: Sigmoid colonic perforation with small free intraperitoneal gas and infiltration of the mesenteric and omental fat in keeping with changes of peritonitis.   Long segment inflammatory stranding of the sigmoid colon in keeping with a severe infectious or inflammatory colitis. This terminates an area of irregular mural thickening, infiltrative soft tissue within the colonic mesentery, and focal dystrophic calcification. This may represent a chronic inflammatory process, however, a perforated malignancy could appear similarly. There are 2 separate points of perforation which again raise the question of an underlying malignancy.   7.1 cm  gas and fluid containing pericolonic abscess within the sigmoid mesentery.   Marked inflammatory change of the terminal ileum adjacent to the pericolonic abscess with resultant small bowel obstruction. Fluid within the distal esophagus likely relates to gastroesophageal reflux the setting of vomiting.   Aortic Atherosclerosis (ICD10-I70.0).   08/08/2021 Cancer Staging   Staging form: Colon and Rectum, AJCC 8th Edition - Pathologic stage from 08/08/2021: Stage IIIB (pT3, pN1a, cM0) - Signed by Malachy Mood, MD on 08/26/2021 Stage prefix: Initial diagnosis Total positive nodes: 1 Histologic grading system: 4 grade system Histologic grade (G): G2 Residual tumor (R): R0 - None   08/08/2021 Definitive Surgery   FINAL MICROSCOPIC DIAGNOSIS:   A. COLON, SIGMOID, PARTIAL COLECTOMY:  - Invasive moderately differentiated adenocarcinoma.  - Metastatic carcinoma involving one of twelve lymph nodes (1/12).  - See oncology table below.   ADDENDUM:  Mismatch Repair Protein (IHC)  SUMMARY INTERPRETATION: NORMAL    08/14/2021 Imaging   EXAM: CT ABDOMEN AND PELVIS WITH CONTRAST  IMPRESSION: 1. Post recent sigmoid colectomy with left lower quadrant colostomy. Two small residual foci of air within the pelvic mesentery with mild adjacent thickening, but no abscess or drainable collection. Trace non organized free fluid and stranding in the pelvis. 2. Short segment of small bowel wall thickening and inflammation in the pelvis involving the distal ileum, likely reactive. 3. Dilated distal esophagus, stomach, and small bowel, without discrete transition point, favoring postoperative ileus. 4. Small bilateral pleural effusions and compressive atelectasis. 5. Heterogeneous partially enhancing 14 mm lymph node in the retroperitoneum at the aortoiliac bifurcation, not significantly changed from prior exam. Suspected additional lymph nodes in the anterior common iliac space, not significantly changed from  prior exam. Recommend attention at follow-up. 6. Additional chronic findings as described.     08/15/2021 Imaging   EXAM: CT CHEST WITH CONTRAST  IMPRESSION: 1. No evidence of thoracic metastasis. 2. Bilateral small layering pleural effusions with passive atelectasis   08/26/2021 Initial Diagnosis   Cancer of left colon (HCC)   10/10/2021 - 12/12/2021 Chemotherapy   Patient is on Treatment Plan : COLORECTAL Xelox (Capeox) q21d     05/28/2022 - 05/28/2022 Chemotherapy   Patient is on Treatment Plan : COLORECTAL FOLFIRI + Bevacizumab q14d     05/28/2022 -  Chemotherapy   Patient is on Treatment Plan : COLORECTAL FOLFIRI + Panitumumab q14d     07/31/2022 Imaging    IMPRESSION: Decreased bilateral pulmonary metastases.   Stable mild abdominal lymphadenopathy.   No new or progressive metastatic disease within the chest, abdomen, or pelvis.   01/18/2023 Imaging    IMPRESSION: 1. Status post Gertie Gowda pouch sigmoid colon resection with left lower quadrant end colostomy and rectal stump. Unchanged, mild wall thickening of the decompressed distal colon at the ileostomy,  as well as the rectal stump. 2. Unchanged small, treated metastatic pulmonary nodules. No new nodules. 3. Unchanged enlarged, coarsely calcified treated lower aortocaval metastatic lymph nodes. No new lymphadenopathy. 4. Coronary artery disease. 5. Cholelithiasis.   04/23/2023 Imaging   CT chest, abdomen, and pelvis with contrast  IMPRESSION: 1. Left lower lobe segmental pulmonary emboli, incidental finding. No evidence for right heart strain. 2. Stable trace bilateral pleural effusions. 3. Stable enlarged lower left paraesophageal lymph node. 4. Stable calcified retroperitoneal lymph nodes. No new enlarged lymph nodes in the chest, abdomen or pelvis. 5. Stable subcentimeter soft tissue nodules within the mesenteric fat along the inferior ostomy. 6. Left Bosniak I benign renal cyst measuring 4.6 cm. No  follow-up imaging is recommended. JACR 2018 Feb; 264-273, Management of the Incidental Renal Mass on CT, [employer] 2021; 814-848, Bosniak Classification of Cystic Renal Masses, Version 2019.       REVIEW OF SYSTEMS:   Constitutional: Denies fevers, chills or abnormal weight loss Eyes: Denies blurriness of vision Ears, nose, mouth, throat, and face: Denies mucositis or sore throat Respiratory: Denies cough, dyspnea or wheezes Cardiovascular: Denies palpitation, chest discomfort or lower extremity swelling Gastrointestinal:  Denies nausea, heartburn or change in bowel habits Skin: Denies abnormal skin rashes Lymphatics: Denies new lymphadenopathy or easy bruising Neurological:Denies numbness, tingling or new weaknesses Behavioral/Psych: Mood is stable, no new changes  All other systems were reviewed with the patient and are negative.   VITALS:   Today's Vitals   05/26/23 0933 05/26/23 0938  BP: 101/80   Pulse: 98   Resp: 18   Temp: 98.6 F (37 C)   TempSrc: Oral   SpO2: 100%   Weight: 153 lb (69.4 kg)   Height: 5\' 9"  (1.753 m)   PainSc:  0-No pain   Body mass index is 22.59 kg/m.   Wt Readings from Last 3 Encounters:  05/26/23 153 lb (69.4 kg)  05/13/23 153 lb 7 oz (69.6 kg)  04/28/23 156 lb (70.8 kg)    Body mass index is 22.59 kg/m.  Performance status (ECOG): 1 - Symptomatic but completely ambulatory  PHYSICAL EXAM:   GENERAL:alert, no distress and comfortable SKIN: skin color, texture, turgor are normal, no rashes or significant lesions EYES: normal, Conjunctiva are pink and non-injected, sclera clear OROPHARYNX:no exudate, no erythema and lips, buccal mucosa, and tongue normal  NECK: supple, thyroid normal size, non-tender, without nodularity LYMPH:  no palpable lymphadenopathy in the cervical, axillary or inguinal LUNGS: clear to auscultation and percussion with normal breathing effort HEART: regular rate & rhythm and no murmurs and no lower extremity  edema ABDOMEN:abdomen soft, non-tender and normal bowel sounds Musculoskeletal:no cyanosis of digits and no clubbing  NEURO: alert & oriented x 3 with fluent speech, no focal motor/sensory deficits  LABORATORY DATA:  I have reviewed the data as listed    Component Value Date/Time   NA 138 05/26/2023 0855   K 3.4 (L) 05/26/2023 0855   CL 107 05/26/2023 0855   CO2 25 05/26/2023 0855   GLUCOSE 91 05/26/2023 0855   BUN 17 05/26/2023 0855   CREATININE 0.71 05/26/2023 0855   CALCIUM 9.1 05/26/2023 0855   PROT 6.6 05/26/2023 0855   ALBUMIN 3.9 05/26/2023 0855   AST 19 05/26/2023 0855   ALT 20 05/26/2023 0855   ALKPHOS 101 05/26/2023 0855   BILITOT 0.5 05/26/2023 0855   GFRNONAA >60 05/26/2023 0855   GFRAA >60 04/01/2020 1237     Lab Results  Component Value Date  WBC 4.5 05/26/2023   NEUTROABS 3.0 05/26/2023   HGB 11.5 (L) 05/26/2023   HCT 37.3 (L) 05/26/2023   MCV 83.1 05/26/2023   PLT 361 05/26/2023

## 2023-05-25 NOTE — Assessment & Plan Note (Signed)
stage IIIB p(T3, N1aM0), MSS, KRAS/NRAS/BRAF wild type, lung and node metastasis in 04/2022  -Initially diagnosed in 08/2021 -he completed 4 cycles adjuvant CAPOX 10/10/21 - 12/25/21 (though he somehow was still taking Xeloda through ~02/09/22 due to misunderstandings).  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 -we have changed his treatment to maintenance therapy with 5-fu and vectibix in late March 2024 -He is tolerating treatment very well, will continue. -due to metastasis in both lungs and retroperitoneal lymph nodes, he is not a candidate for surgery or local therapy  -restaging CT 04/23/2023 showed stable disease

## 2023-05-26 ENCOUNTER — Inpatient Hospital Stay (HOSPITAL_BASED_OUTPATIENT_CLINIC_OR_DEPARTMENT_OTHER): Payer: MEDICAID | Admitting: Nurse Practitioner

## 2023-05-26 ENCOUNTER — Inpatient Hospital Stay: Payer: MEDICAID

## 2023-05-26 ENCOUNTER — Other Ambulatory Visit: Payer: Self-pay

## 2023-05-26 ENCOUNTER — Encounter: Payer: Self-pay | Admitting: Nurse Practitioner

## 2023-05-26 VITALS — BP 101/80 | HR 98 | Temp 98.6°F | Resp 18 | Ht 69.0 in | Wt 153.0 lb

## 2023-05-26 DIAGNOSIS — C186 Malignant neoplasm of descending colon: Secondary | ICD-10-CM | POA: Diagnosis not present

## 2023-05-26 DIAGNOSIS — Z5111 Encounter for antineoplastic chemotherapy: Secondary | ICD-10-CM | POA: Diagnosis not present

## 2023-05-26 DIAGNOSIS — Z95828 Presence of other vascular implants and grafts: Secondary | ICD-10-CM

## 2023-05-26 DIAGNOSIS — C189 Malignant neoplasm of colon, unspecified: Secondary | ICD-10-CM

## 2023-05-26 LAB — CBC WITH DIFFERENTIAL (CANCER CENTER ONLY)
Abs Immature Granulocytes: 0.01 10*3/uL (ref 0.00–0.07)
Basophils Absolute: 0 10*3/uL (ref 0.0–0.1)
Basophils Relative: 1 %
Eosinophils Absolute: 0.2 10*3/uL (ref 0.0–0.5)
Eosinophils Relative: 4 %
HCT: 37.3 % — ABNORMAL LOW (ref 39.0–52.0)
Hemoglobin: 11.5 g/dL — ABNORMAL LOW (ref 13.0–17.0)
Immature Granulocytes: 0 %
Lymphocytes Relative: 17 %
Lymphs Abs: 0.8 10*3/uL (ref 0.7–4.0)
MCH: 25.6 pg — ABNORMAL LOW (ref 26.0–34.0)
MCHC: 30.8 g/dL (ref 30.0–36.0)
MCV: 83.1 fL (ref 80.0–100.0)
Monocytes Absolute: 0.5 10*3/uL (ref 0.1–1.0)
Monocytes Relative: 11 %
Neutro Abs: 3 10*3/uL (ref 1.7–7.7)
Neutrophils Relative %: 67 %
Platelet Count: 361 10*3/uL (ref 150–400)
RBC: 4.49 MIL/uL (ref 4.22–5.81)
RDW: 19 % — ABNORMAL HIGH (ref 11.5–15.5)
WBC Count: 4.5 10*3/uL (ref 4.0–10.5)
nRBC: 0 % (ref 0.0–0.2)

## 2023-05-26 LAB — CMP (CANCER CENTER ONLY)
ALT: 20 U/L (ref 0–44)
AST: 19 U/L (ref 15–41)
Albumin: 3.9 g/dL (ref 3.5–5.0)
Alkaline Phosphatase: 101 U/L (ref 38–126)
Anion gap: 6 (ref 5–15)
BUN: 17 mg/dL (ref 6–20)
CO2: 25 mmol/L (ref 22–32)
Calcium: 9.1 mg/dL (ref 8.9–10.3)
Chloride: 107 mmol/L (ref 98–111)
Creatinine: 0.71 mg/dL (ref 0.61–1.24)
GFR, Estimated: >60 mL/min (ref >60)
Glucose, Bld: 91 mg/dL (ref 70–99)
Potassium: 3.4 mmol/L — ABNORMAL LOW (ref 3.5–5.1)
Sodium: 138 mmol/L (ref 135–145)
Total Bilirubin: 0.5 mg/dL (ref 0.3–1.2)
Total Protein: 6.6 g/dL (ref 6.5–8.1)

## 2023-05-26 LAB — MAGNESIUM: Magnesium: 1.6 mg/dL — ABNORMAL LOW (ref 1.7–2.4)

## 2023-05-26 MED ORDER — MAGNESIUM SULFATE 2 GM/50ML IV SOLN
2.0000 g | Freq: Once | INTRAVENOUS | Status: AC
Start: 2023-05-26 — End: 2023-05-26
  Administered 2023-05-26: 2 g via INTRAVENOUS
  Filled 2023-05-26: qty 50

## 2023-05-26 MED ORDER — SODIUM CHLORIDE 0.9 % IV SOLN
2400.0000 mg/m2 | INTRAVENOUS | Status: DC
  Administered 2023-05-26: 4350 mg via INTRAVENOUS
  Filled 2023-05-26: qty 87

## 2023-05-26 MED ORDER — APIXABAN 5 MG PO TABS: 5.0000 mg | ORAL_TABLET | Freq: Two times a day (BID) | ORAL | 1 refills | Status: DC

## 2023-05-26 MED ORDER — HEPARIN SOD (PORK) LOCK FLUSH 100 UNIT/ML IV SOLN
500.0000 [IU] | Freq: Once | INTRAVENOUS | Status: DC | PRN
Start: 2023-05-26 — End: 2023-05-26

## 2023-05-26 MED ORDER — SODIUM CHLORIDE 0.9 % IV SOLN: Freq: Once | INTRAVENOUS | Status: AC

## 2023-05-26 MED ORDER — DEXAMETHASONE SODIUM PHOSPHATE 10 MG/ML IJ SOLN
10.0000 mg | Freq: Once | INTRAMUSCULAR | Status: AC
  Administered 2023-05-26: 10 mg via INTRAVENOUS
  Filled 2023-05-26: qty 1

## 2023-05-26 MED ORDER — SODIUM CHLORIDE 0.9 % IV SOLN
400.0000 mg/m2 | Freq: Once | INTRAVENOUS | Status: AC
  Administered 2023-05-26: 728 mg via INTRAVENOUS
  Filled 2023-05-26: qty 25

## 2023-05-26 MED ORDER — SODIUM CHLORIDE 0.9% FLUSH
10.0000 mL | INTRAVENOUS | Status: DC | PRN
Start: 2023-05-26 — End: 2023-05-26

## 2023-05-26 MED ORDER — PANITUMUMAB CHEMO INJECTION 100 MG/5ML
6.0000 mg/kg | Freq: Once | INTRAVENOUS | Status: AC
  Administered 2023-05-26: 400 mg via INTRAVENOUS
  Filled 2023-05-26: qty 20

## 2023-05-26 MED ORDER — SODIUM CHLORIDE 0.9% FLUSH
10.0000 mL | Freq: Once | INTRAVENOUS | Status: AC
  Administered 2023-05-26: 10 mL

## 2023-05-26 MED ORDER — PALONOSETRON HCL INJECTION 0.25 MG/5ML
0.2500 mg | Freq: Once | INTRAVENOUS | Status: AC
  Administered 2023-05-26: 0.25 mg via INTRAVENOUS
  Filled 2023-05-26: qty 5

## 2023-05-26 NOTE — Patient Instructions (Signed)
[facility]  Discharge Instructions: Thank you for choosing [facility] to provide your oncology and hematology care.   If you have a lab appointment with the Cancer Center, please go directly to the Cancer Center and check in at the registration area.   Wear comfortable clothing and clothing appropriate for easy access to any Portacath or PICC line.   We strive to give you quality time with your provider. You may need to reschedule your appointment if you arrive late (15 or more minutes).  Arriving late affects you and other patients whose appointments are after yours.  Also, if you miss three or more appointments without notifying the office, you may be dismissed from the clinic at the provider's discretion.      For prescription refill requests, have your pharmacy contact our office and allow 72 hours for refills to be completed.    Today you received the following chemotherapy and/or immunotherapy agents: Vectibix, Leucovorin, Fluorouracil.       To help prevent nausea and vomiting after your treatment, we encourage you to take your nausea medication as directed.  BELOW ARE SYMPTOMS THAT SHOULD BE REPORTED IMMEDIATELY: *FEVER GREATER THAN 100.4 F (38 C) OR HIGHER *CHILLS OR SWEATING *NAUSEA AND VOMITING THAT IS NOT CONTROLLED WITH YOUR NAUSEA MEDICATION *UNUSUAL SHORTNESS OF BREATH *UNUSUAL BRUISING OR BLEEDING *URINARY PROBLEMS (pain or burning when urinating, or frequent urination) *BOWEL PROBLEMS (unusual diarrhea, constipation, pain near the anus) TENDERNESS IN MOUTH AND THROAT WITH OR WITHOUT PRESENCE OF ULCERS (sore throat, sores in mouth, or a toothache) UNUSUAL RASH, SWELLING OR PAIN  UNUSUAL VAGINAL DISCHARGE OR ITCHING   Items with * indicate a potential emergency and should be followed up as soon as possible or go to the Emergency Department if any problems should occur.  Please show the CHEMOTHERAPY ALERT CARD or  IMMUNOTHERAPY ALERT CARD at check-in to the Emergency Department and triage nurse.  Should you have questions after your visit or need to cancel or reschedule your appointment, please contact [facility]  Dept: [phone]  and follow the prompts.  Office hours are 8:00 a.m. to 4:30 p.m. Monday - Friday. Please note that voicemails left after 4:00 p.m. may not be returned until the following business day.  We are closed weekends and major holidays. You have access to a nurse at all times for urgent questions. Please call the main number to the clinic Dept: [phone] and follow the prompts.   For any non-urgent questions, you may also contact your provider using MyChart. We now offer e-Visits for anyone 110 and older to request care online for non-urgent symptoms. For details visit [email or web address].   Also download the MyChart app! Go to the app store, search "MyChart", open the app, select [place], and log in with your MyChart username and password.

## 2023-05-27 ENCOUNTER — Other Ambulatory Visit: Payer: MEDICAID

## 2023-05-27 ENCOUNTER — Ambulatory Visit: Payer: MEDICAID

## 2023-05-27 ENCOUNTER — Ambulatory Visit: Payer: MEDICAID | Admitting: Nurse Practitioner

## 2023-05-28 ENCOUNTER — Other Ambulatory Visit: Payer: Self-pay

## 2023-05-28 ENCOUNTER — Inpatient Hospital Stay: Payer: MEDICAID

## 2023-05-28 VITALS — BP 113/81 | HR 104 | Temp 98.0°F | Resp 18

## 2023-05-28 DIAGNOSIS — C186 Malignant neoplasm of descending colon: Secondary | ICD-10-CM

## 2023-05-28 DIAGNOSIS — Z5111 Encounter for antineoplastic chemotherapy: Secondary | ICD-10-CM | POA: Diagnosis not present

## 2023-05-28 MED ORDER — HEPARIN SOD (PORK) LOCK FLUSH 100 UNIT/ML IV SOLN
500.0000 [IU] | Freq: Once | INTRAVENOUS | Status: AC | PRN
  Administered 2023-05-28: 500 [IU]

## 2023-05-28 MED ORDER — HEPARIN SOD (PORK) LOCK FLUSH 100 UNIT/ML IV SOLN
250.0000 [IU] | Freq: Once | INTRAVENOUS | Status: DC | PRN
Start: 2023-05-28 — End: 2023-05-28

## 2023-05-28 MED ORDER — SODIUM CHLORIDE 0.9% FLUSH
10.0000 mL | INTRAVENOUS | Status: DC | PRN
  Administered 2023-05-28: 10 mL

## 2023-06-10 ENCOUNTER — Encounter: Payer: Self-pay | Admitting: Hematology

## 2023-06-10 ENCOUNTER — Inpatient Hospital Stay: Payer: MEDICAID | Attending: Hematology

## 2023-06-10 ENCOUNTER — Inpatient Hospital Stay (HOSPITAL_BASED_OUTPATIENT_CLINIC_OR_DEPARTMENT_OTHER): Payer: MEDICAID | Attending: Hematology | Admitting: Hematology

## 2023-06-10 ENCOUNTER — Inpatient Hospital Stay: Payer: MEDICAID

## 2023-06-10 VITALS — BP 100/72 | HR 83 | Temp 98.2°F | Resp 16 | Wt 152.1 lb

## 2023-06-10 DIAGNOSIS — C7801 Secondary malignant neoplasm of right lung: Secondary | ICD-10-CM | POA: Insufficient documentation

## 2023-06-10 DIAGNOSIS — C186 Malignant neoplasm of descending colon: Secondary | ICD-10-CM | POA: Insufficient documentation

## 2023-06-10 DIAGNOSIS — Z5111 Encounter for antineoplastic chemotherapy: Secondary | ICD-10-CM | POA: Insufficient documentation

## 2023-06-10 DIAGNOSIS — C7802 Secondary malignant neoplasm of left lung: Secondary | ICD-10-CM | POA: Insufficient documentation

## 2023-06-10 DIAGNOSIS — Z95828 Presence of other vascular implants and grafts: Secondary | ICD-10-CM

## 2023-06-10 DIAGNOSIS — Z79899 Other long term (current) drug therapy: Secondary | ICD-10-CM | POA: Insufficient documentation

## 2023-06-10 DIAGNOSIS — Z5112 Encounter for antineoplastic immunotherapy: Secondary | ICD-10-CM | POA: Insufficient documentation

## 2023-06-10 DIAGNOSIS — C189 Malignant neoplasm of colon, unspecified: Secondary | ICD-10-CM

## 2023-06-10 LAB — CBC WITH DIFFERENTIAL (CANCER CENTER ONLY)
Abs Immature Granulocytes: 0 10*3/uL (ref 0.00–0.07)
Basophils Absolute: 0 10*3/uL (ref 0.0–0.1)
Basophils Relative: 1 %
Eosinophils Absolute: 0.1 10*3/uL (ref 0.0–0.5)
Eosinophils Relative: 3 %
HCT: 34.3 % — ABNORMAL LOW (ref 39.0–52.0)
Hemoglobin: 11.1 g/dL — ABNORMAL LOW (ref 13.0–17.0)
Immature Granulocytes: 0 %
Lymphocytes Relative: 26 %
Lymphs Abs: 1 10*3/uL (ref 0.7–4.0)
MCH: 25.5 pg — ABNORMAL LOW (ref 26.0–34.0)
MCHC: 32.4 g/dL (ref 30.0–36.0)
MCV: 78.9 fL — ABNORMAL LOW (ref 80.0–100.0)
Monocytes Absolute: 0.4 10*3/uL (ref 0.1–1.0)
Monocytes Relative: 11 %
Neutro Abs: 2.2 10*3/uL (ref 1.7–7.7)
Neutrophils Relative %: 59 %
Platelet Count: 372 10*3/uL (ref 150–400)
RBC: 4.35 MIL/uL (ref 4.22–5.81)
RDW: 19.9 % — ABNORMAL HIGH (ref 11.5–15.5)
WBC Count: 3.8 10*3/uL — ABNORMAL LOW (ref 4.0–10.5)
nRBC: 0 % (ref 0.0–0.2)

## 2023-06-10 LAB — CMP (CANCER CENTER ONLY)
ALT: 20 U/L (ref 0–44)
AST: 22 U/L (ref 15–41)
Albumin: 3.9 g/dL (ref 3.5–5.0)
Alkaline Phosphatase: 111 U/L (ref 38–126)
Anion gap: 7 (ref 5–15)
BUN: 8 mg/dL (ref 6–20)
CO2: 25 mmol/L (ref 22–32)
Calcium: 9.3 mg/dL (ref 8.9–10.3)
Chloride: 107 mmol/L (ref 98–111)
Creatinine: 0.7 mg/dL (ref 0.61–1.24)
GFR, Estimated: >60 mL/min (ref >60)
Glucose, Bld: 91 mg/dL (ref 70–99)
Potassium: 3.6 mmol/L (ref 3.5–5.1)
Sodium: 139 mmol/L (ref 135–145)
Total Bilirubin: 0.8 mg/dL (ref <1.2)
Total Protein: 6.7 g/dL (ref 6.5–8.1)

## 2023-06-10 LAB — MAGNESIUM: Magnesium: 1.8 mg/dL (ref 1.7–2.4)

## 2023-06-10 MED ORDER — SODIUM CHLORIDE 0.9% FLUSH
10.0000 mL | Freq: Once | INTRAVENOUS | Status: AC
  Administered 2023-06-10: 10 mL

## 2023-06-10 MED ORDER — SODIUM CHLORIDE 0.9 % IV SOLN
2400.0000 mg/m2 | INTRAVENOUS | Status: DC
  Administered 2023-06-10: 4350 mg via INTRAVENOUS
  Filled 2023-06-10: qty 87

## 2023-06-10 MED ORDER — SODIUM CHLORIDE 0.9 % IV SOLN
6.0000 mg/kg | Freq: Once | INTRAVENOUS | Status: AC
  Administered 2023-06-10: 400 mg via INTRAVENOUS
  Filled 2023-06-10: qty 20

## 2023-06-10 MED ORDER — PALONOSETRON HCL INJECTION 0.25 MG/5ML
0.2500 mg | Freq: Once | INTRAVENOUS | Status: AC
Start: 2023-06-10 — End: 2023-06-10
  Administered 2023-06-10: 0.25 mg via INTRAVENOUS
  Filled 2023-06-10: qty 5

## 2023-06-10 MED ORDER — SODIUM CHLORIDE 0.9 % IV SOLN: Freq: Once | INTRAVENOUS | Status: AC

## 2023-06-10 MED ORDER — DEXAMETHASONE SODIUM PHOSPHATE 10 MG/ML IJ SOLN
10.0000 mg | Freq: Once | INTRAMUSCULAR | Status: AC
Start: 2023-06-10 — End: 2023-06-10
  Administered 2023-06-10: 10 mg via INTRAVENOUS
  Filled 2023-06-10: qty 1

## 2023-06-10 MED ORDER — SODIUM CHLORIDE 0.9 % IV SOLN
400.0000 mg/m2 | Freq: Once | INTRAVENOUS | Status: AC
  Administered 2023-06-10: 728 mg via INTRAVENOUS
  Filled 2023-06-10: qty 17.5

## 2023-06-10 NOTE — Assessment & Plan Note (Signed)
stage IIIB p(T3, N1aM0), MSS, KRAS/NRAS/BRAF wild type, lung and node metastasis in 04/2022  -Initially diagnosed in 08/2021 -he completed 4 cycles adjuvant CAPOX 10/10/21 - 12/25/21 (though he somehow was still taking Xeloda through ~02/09/22 due to misunderstandings).  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 -we have changed his treatment to maintenance therapy with 5-fu and vectibix in late March 2024 -He is tolerating treatment very well, will continue. -due to metastasis in both lungs and retroperitoneal lymph nodes, he is not a candidate for surgery or local therapy  -restaging CT 04/23/2023 showed stable disease

## 2023-06-10 NOTE — Progress Notes (Signed)
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 -we have changed his treatment to maintenance therapy with 5-fu and vectibix in late March 2024 -He is tolerating treatment very well, will continue. -due to metastasis in both lungs and retroperitoneal lymph nodes, he is not a candidate for surgery or local therapy  -restaging CT 04/23/2023 showed stable disease     Assessment and Plan    Metastatic Colon Cancer Stable weight and appetite. No new symptoms. Rash present but improved. White blood cell count slightly low but adequate for treatment continuation. -Continue current treatment regimen. -Schedule next cycle of treatment. -Plan for imaging in January to assess disease status.  Venous Thromboembolism On Eliquis for blood clot. No new symptoms. -Continue Eliquis as prescribed.  Hypertension Blood pressure stable at 100/72. Patient consuming sodas. -Encourage healthier beverage choices such as  juice to maintain stable blood pressure.  Plan -Lab reviewed, adequate for treatment, will proceed 5-FU pump infusion/leucovorin, and Vectibix Follow-up in 2 weeks before next cycle chemo        SUMMARY OF ONCOLOGIC HISTORY: Oncology History Overview Note   Cancer Staging  Cancer of left colon [facility]) Staging form: Colon and Rectum, AJCC 8th Edition - Pathologic stage from 08/08/2021: Stage IIIB (pT3, pN1a, cM0) - Signed by Malachy Mood, MD on 08/26/2021    Cancer of left colon (HCC)  04/01/2020 Imaging   IMPRESSION: 1. Gallbladder decompressed bowel also partially calcified gallstones. No pericholecystic inflammation though if there is persisting clinical concern for cholecystitis right upper quadrant ultrasound could be obtained. 2. Circumferential thickening of the distal thoracic esophagus. Could reflect features of esophagitis. Correlate with clinical symptoms and consider endoscopy as clinically warranted. 3. Additional segmental thickening of the mid to distal sigmoid with focal narrowing. No acute surrounding inflammation or resulting obstruction. Findings are nonspecific, and could reflect sequela of prior inflammation/infection. However, recommend correlation with colonoscopy if not recently performed. 4. Mild circumferential bladder wall thickening and indentation of the bladder base by an enlarged prostate. Possibly sequela of chronic outlet obstruction though could correlate with urinalysis to exclude cystitis. 5. Aortic Atherosclerosis (ICD10-I70.0).   08/06/2021 Imaging   IMPRESSION: Sigmoid colonic perforation with small free intraperitoneal gas and infiltration of the mesenteric and omental fat in keeping with changes of peritonitis.   Long segment inflammatory stranding of the sigmoid colon in keeping with a severe infectious or inflammatory colitis. This terminates an area of irregular mural thickening, infiltrative soft tissue within the colonic mesentery, and  focal dystrophic calcification. This may represent a chronic inflammatory process, however, a perforated malignancy could appear  similarly. There are 2 separate points of perforation which again raise the question of an underlying malignancy.   7.1 cm gas and fluid containing pericolonic abscess within the sigmoid mesentery.   Marked inflammatory change of the terminal ileum adjacent to the pericolonic abscess with resultant small bowel obstruction. Fluid within the distal esophagus likely relates to gastroesophageal reflux the setting of vomiting.   Aortic Atherosclerosis (ICD10-I70.0).   08/08/2021 Cancer Staging   Staging form: Colon and Rectum, AJCC 8th Edition - Pathologic stage from 08/08/2021: Stage IIIB (pT3, pN1a, cM0) - Signed by Malachy Mood, MD on 08/26/2021 Stage prefix: Initial diagnosis Total positive nodes: 1 Histologic grading system: 4 grade system Histologic grade (G): G2 Residual tumor (R): R0 - None   08/08/2021 Definitive Surgery   FINAL MICROSCOPIC DIAGNOSIS:   A. COLON, SIGMOID, PARTIAL COLECTOMY:  - Invasive moderately differentiated adenocarcinoma.  - Metastatic carcinoma involving one of twelve lymph nodes (1/12).  - See oncology table below.   ADDENDUM:  Mismatch Repair Protein (IHC)  SUMMARY INTERPRETATION: NORMAL    08/14/2021 Imaging   EXAM: CT ABDOMEN AND PELVIS WITH CONTRAST  IMPRESSION: 1. Post recent sigmoid colectomy with left lower quadrant colostomy. Two small residual foci of air within the pelvic mesentery with mild adjacent thickening, but no abscess or drainable collection. Trace non organized free fluid and stranding in the pelvis. 2. Short segment of small bowel wall thickening and inflammation in the pelvis involving the distal ileum, likely reactive. 3. Dilated distal esophagus, stomach, and small bowel, without discrete transition point, favoring postoperative ileus. 4. Small bilateral pleural effusions and compressive atelectasis. 5.  Heterogeneous partially enhancing 14 mm lymph node in the retroperitoneum at the aortoiliac bifurcation, not significantly changed from prior exam. Suspected additional lymph nodes in the anterior common iliac space, not significantly changed from prior exam. Recommend attention at follow-up. 6. Additional chronic findings as described.     08/15/2021 Imaging   EXAM: CT CHEST WITH CONTRAST  IMPRESSION: 1. No evidence of thoracic metastasis. 2. Bilateral small layering pleural effusions with passive atelectasis   08/26/2021 Initial Diagnosis   Cancer of left colon (HCC)   10/10/2021 - 12/12/2021 Chemotherapy   Patient is on Treatment Plan : COLORECTAL Xelox (Capeox) q21d     05/28/2022 - 05/28/2022 Chemotherapy   Patient is on Treatment Plan : COLORECTAL FOLFIRI + Bevacizumab q14d     05/28/2022 -  Chemotherapy   Patient is on Treatment Plan : COLORECTAL FOLFIRI + Panitumumab q14d     07/31/2022 Imaging    IMPRESSION: Decreased bilateral pulmonary metastases.   Stable mild abdominal lymphadenopathy.   No new or progressive metastatic disease within the chest, abdomen, or pelvis.   01/18/2023 Imaging    IMPRESSION: 1. Status post Gertie Gowda pouch sigmoid colon resection with left lower quadrant end colostomy and rectal stump. Unchanged, mild wall thickening of the decompressed distal colon at the ileostomy, as well as the rectal stump. 2. Unchanged small, treated metastatic pulmonary nodules. No new nodules. 3. Unchanged enlarged, coarsely calcified treated lower aortocaval metastatic lymph nodes. No new lymphadenopathy. 4. Coronary artery disease. 5. Cholelithiasis.   04/23/2023 Imaging   CT chest, abdomen, and pelvis with contrast  IMPRESSION: 1. Left lower lobe segmental pulmonary emboli, incidental finding. No evidence for right heart strain. 2. Stable trace bilateral pleural effusions. 3. Stable enlarged lower left paraesophageal lymph node. 4. Stable calcified  retroperitoneal lymph nodes. No new enlarged lymph nodes in the chest, abdomen or pelvis. 5. Stable subcentimeter soft tissue  nodules within the mesenteric fat along the inferior ostomy. 6. Left Bosniak I benign renal cyst measuring 4.6 cm. No follow-up imaging is recommended. JACR 2018 Feb; 264-273, Management of the Incidental Renal Mass on CT, [employer] 2021; 814-848, Bosniak Classification of Cystic Renal Masses, Version 2019.      Discussed the use of AI scribe software for clinical note transcription with the patient, who gave verbal consent to proceed.  History of Present Illness   A 57 year old male with a known diagnosis of metastatic colon cancer presents for a follow-up visit. The patient's weight has remained stable, and he reports good appetite. He has experienced redness in his eyes, which has since improved. His blood pressure readings have been consistent, with the most recent reading being 100/72. The patient denies any dizziness or pain. He has a persistent rash, which appears to be improving. The patient is currently on a blood thinner, Eliquis, for a blood clot. He takes two tablets twice a day for the first week, then one tablet twice a day thereafter. The patient's white blood cell count is slightly low, but it is deemed sufficient for continuing treatment.         All other systems were reviewed with the patient and are negative.  MEDICAL HISTORY:  Past Medical History:  Diagnosis Date   Cancer (HCC)    Schizophrenia [facility])     SURGICAL HISTORY: Past Surgical History:  Procedure Laterality Date   BRONCHIAL BIOPSY  05/11/2022   Procedure: BRONCHIAL BIOPSIES;  Surgeon: Leslye Peer, MD;  Location[facility] ENDOSCOPY;  Service: Pulmonary;;   BRONCHIAL BRUSHINGS  05/11/2022   Procedure: BRONCHIAL BRUSHINGS;  Surgeon: Leslye Peer, MD;  Location: [facility] ENDOSCOPY;  Service: Pulmonary;;   BRONCHIAL NEEDLE ASPIRATION BIOPSY  05/11/2022   Procedure: BRONCHIAL NEEDLE  ASPIRATION BIOPSIES;  Surgeon: Leslye Peer, MD;  Location: [facility] ENDOSCOPY;  Service: Pulmonary;;   BRONCHIAL WASHINGS  05/11/2022   Procedure: BRONCHIAL WASHINGS;  Surgeon: Leslye Peer, MD;  Location: [facility] ENDOSCOPY;  Service: Pulmonary;;   IR IMAGING GUIDED PORT INSERTION  05/25/2022   LAPAROTOMY N/A 08/08/2021   Procedure: EXPLORATORY LAPAROTOMY;  Surgeon: Berna Bue, MD;  Location: [facility] OR;  Service: General;  Laterality: N/A;   PARTIAL COLECTOMY N/A 08/08/2021   Procedure: PARTIAL COLECTOMY WITH COLOSTOMY;  Surgeon: Berna Bue, MD;  Location: [facility] OR;  Service: General;  Laterality: N/A;   VIDEO BRONCHOSCOPY WITH RADIAL ENDOBRONCHIAL ULTRASOUND  05/11/2022   Procedure: VIDEO BRONCHOSCOPY WITH RADIAL ENDOBRONCHIAL ULTRASOUND;  Surgeon: Leslye Peer, MD;  Location: [facility] ENDOSCOPY;  Service: Pulmonary;;    I have reviewed the social history and family history with the patient and they are unchanged from previous note.  ALLERGIES:  has No Known Allergies.  MEDICATIONS:  Current Outpatient Medications  Medication Sig Dispense Refill   acetaminophen (TYLENOL) 500 MG tablet Take 2 tablets (1,000 mg total) by mouth every 6 (six) hours as needed for mild pain or moderate pain.  0   apixaban (ELIQUIS) 5 MG TABS tablet Take 1 tablet (5 mg total) by mouth 2 (two) times daily. 60 tablet 1   aspirin EC 81 MG tablet Take 81 mg by mouth daily as needed (for pain or headaches).      benztropine (COGENTIN) 1 MG tablet Take 1 mg at bedtime by mouth.     clindamycin (CLINDAGEL) 1 % gel Apply topically 2 (two) times daily. To skin rash on face and upper body 30 g 0  ferrous sulfate 325 (65 FE) MG EC tablet Take 1 tablet (325 mg total) by mouth daily. 30 tablet 3   hydrocortisone cream 1 % Apply 1 Application topically 2 (two) times daily as needed for itching. For rash 30 g 2   loperamide (IMODIUM) 2 MG capsule Take 1 capsule (2 mg total) by mouth as needed for diarrhea or loose stools. 30 capsule  0   magnesium oxide (MAG-OX) 400 (240 Mg) MG tablet Take 1 tablet (400 mg total) by mouth daily. 30 tablet 2   Multiple Vitamin (MULTIVITAMIN WITH MINERALS) TABS tablet Take 1 tablet by mouth daily.     ondansetron (ZOFRAN) 8 MG tablet Take 1 tablet (8 mg total) by mouth every 8 (eight) hours as needed for nausea or vomiting. 30 tablet 0   paliperidone (INVEGA SUSTENNA) 156 MG/ML SUSP injection Inject 156 mg every 30 (thirty) days into the muscle.     polyethylene glycol (MIRALAX / GLYCOLAX) 17 g packet Take 17 g by mouth daily as needed for mild constipation or moderate constipation.  0   potassium chloride SA (KLOR-CON M) 20 MEQ tablet TAKE 1 TABLET(20 MEQ) BY MOUTH DAILY 30 tablet 2   prochlorperazine (COMPAZINE) 10 MG tablet TAKE 1 TABLET(10 MG) BY MOUTH EVERY 6 HOURS AS NEEDED FOR NAUSEA OR VOMITING 30 tablet 0   No current facility-administered medications for this visit.   Facility-Administered Medications Ordered in Other Visits  Medication Dose Route Frequency Provider Last Rate Last Admin   fluorouracil (ADRUCIL) 4,350 mg in sodium chloride 0.9 % 63 mL chemo infusion  2,400 mg/m2 (Treatment Plan Recorded) Intravenous 1 day or 1 dose Malachy Mood, MD   Infusion Verify at 06/10/23 1457    PHYSICAL EXAMINATION: ECOG PERFORMANCE STATUS: 1 - Symptomatic but completely ambulatory  Vitals:   06/10/23 1156  BP: 100/72  Pulse: 83  Resp: 16  Temp: 98.2 F (36.8 C)  SpO2: 100%   Wt Readings from Last 3 Encounters:  06/10/23 152 lb 1.6 oz (69 kg)  05/26/23 153 lb (69.4 kg)  05/13/23 153 lb 7 oz (69.6 kg)     GENERAL:alert, no distress and comfortable SKIN: skin color, texture, turgor are normal, no rashes or significant lesions EYES: normal, Conjunctiva are pink and non-injected, sclera clear NECK: supple, thyroid normal size, non-tender, without nodularity LYMPH:  no palpable lymphadenopathy in the cervical, axillary  LUNGS: clear to auscultation and percussion with normal  breathing effort HEART: regular rate & rhythm and no murmurs and no lower extremity edema ABDOMEN:abdomen soft, non-tender and normal bowel sounds Musculoskeletal:no cyanosis of digits and no clubbing  NEURO: alert & oriented x 3 with fluent speech, no focal motor/sensory deficits   LABORATORY DATA:  I have reviewed the data as listed    Latest Ref Rng & Units 06/10/2023   11:24 AM 05/26/2023    8:55 AM 05/13/2023   10:58 AM  CBC  WBC 4.0 - 10.5 K/uL 3.8  4.5  5.0   Hemoglobin 13.0 - 17.0 g/dL 06.3  01.6  01.0   Hematocrit 39.0 - 52.0 % 34.3  37.3  35.8   Platelets 150 - 400 K/uL 372  361  332         Latest Ref Rng & Units 06/10/2023   11:24 AM 05/26/2023    8:55 AM 05/13/2023   10:58 AM  CMP  Glucose 70 - 99 mg/dL 91  91  86   BUN 6 - 20 mg/dL 8  17  12  Creatinine 0.61 - 1.24 mg/dL 1.61  0.96  0.45   Sodium 135 - 145 mmol/L 139  138  139   Potassium 3.5 - 5.1 mmol/L 3.6  3.4  3.4   Chloride 98 - 111 mmol/L 107  107  108   CO2 22 - 32 mmol/L 25  25  24    Calcium 8.9 - 10.3 mg/dL 9.3  9.1  9.1   Total Protein 6.5 - 8.1 g/dL 6.7  6.6  6.6   Total Bilirubin <1.2 mg/dL 0.8  0.5  0.5   Alkaline Phos 38 - 126 U/L 111  101  82   AST 15 - 41 U/L 22  19  14    ALT 0 - 44 U/L 20  20  10        RADIOGRAPHIC STUDIES: I have personally reviewed the radiological images as listed and agreed with the findings in the report. No results found.    Orders Placed This Encounter  Procedures   CBC with Differential (Cancer Center Only)    Standing Status:   Future    Standing Expiration Date:   06/23/2024   CMP (Cancer Center only)    Standing Status:   Future    Standing Expiration Date:   06/23/2024   Magnesium    Standing Status:   Future    Standing Expiration Date:   06/23/2024   CBC with Differential (Cancer Center Only)    Standing Status:   Future    Standing Expiration Date:   07/07/2024   CMP (Cancer Center only)    Standing Status:   Future    Standing Expiration  Date:   07/07/2024   Magnesium    Standing Status:   Future    Standing Expiration Date:   07/07/2024   CBC with Differential (Cancer Center Only)    Standing Status:   Future    Standing Expiration Date:   07/21/2024   CMP (Cancer Center only)    Standing Status:   Future    Standing Expiration Date:   07/21/2024   Magnesium    Standing Status:   Future    Standing Expiration Date:   07/21/2024   CBC with Differential (Cancer Center Only)    Standing Status:   Future    Standing Expiration Date:   08/04/2024   CMP (Cancer Center only)    Standing Status:   Future    Standing Expiration Date:   08/04/2024   Magnesium    Standing Status:   Future    Standing Expiration Date:   08/04/2024   All questions were answered. The patient knows to call the clinic with any problems, questions or concerns. No barriers to learning was detected. The total time spent in the appointment was 25 minutes.     Malachy Mood, MD 06/10/2023

## 2023-06-10 NOTE — Patient Instructions (Signed)
[facility] - A DEPT OF [facility]  Discharge Instructions: Thank you for choosing [facility] to provide your oncology and hematology care.   If you have a lab appointment with the Cancer Center, please go directly to the Cancer Center and check in at the registration area.   Wear comfortable clothing and clothing appropriate for easy access to any Portacath or PICC line.   We strive to give you quality time with your provider. You may need to reschedule your appointment if you arrive late (15 or more minutes).  Arriving late affects you and other patients whose appointments are after yours.  Also, if you miss three or more appointments without notifying the office, you may be dismissed from the clinic at the provider's discretion.      For prescription refill requests, have your pharmacy contact our office and allow 72 hours for refills to be completed.    Today you received the following chemotherapy and/or immunotherapy agents: Vectibix, Leucovorin, Fluorouracil.       To help prevent nausea and vomiting after your treatment, we encourage you to take your nausea medication as directed.  BELOW ARE SYMPTOMS THAT SHOULD BE REPORTED IMMEDIATELY: *FEVER GREATER THAN 100.4 F (38 C) OR HIGHER *CHILLS OR SWEATING *NAUSEA AND VOMITING THAT IS NOT CONTROLLED WITH YOUR NAUSEA MEDICATION *UNUSUAL SHORTNESS OF BREATH *UNUSUAL BRUISING OR BLEEDING *URINARY PROBLEMS (pain or burning when urinating, or frequent urination) *BOWEL PROBLEMS (unusual diarrhea, constipation, pain near the anus) TENDERNESS IN MOUTH AND THROAT WITH OR WITHOUT PRESENCE OF ULCERS (sore throat, sores in mouth, or a toothache) UNUSUAL RASH, SWELLING OR PAIN  UNUSUAL VAGINAL DISCHARGE OR ITCHING   Items with * indicate a potential emergency and should be followed up as soon as possible or go to the Emergency Department if any problems should occur.  Please show the CHEMOTHERAPY  ALERT CARD or IMMUNOTHERAPY ALERT CARD at check-in to the Emergency Department and triage nurse.  Should you have questions after your visit or need to cancel or reschedule your appointment, please contact [facility] - A DEPT OF [facility]  Dept: [phone]  and follow the prompts.  Office hours are 8:00 a.m. to 4:30 p.m. Monday - Friday. Please note that voicemails left after 4:00 p.m. may not be returned until the following business day.  We are closed weekends and major holidays. You have access to a nurse at all times for urgent questions. Please call the main number to the clinic Dept: [phone] and follow the prompts.   For any non-urgent questions, you may also contact your provider using MyChart. We now offer e-Visits for anyone 55 and older to request care online for non-urgent symptoms. For details visit [email or web address].   Also download the MyChart app! Go to the app store, search "MyChart", open the app, select [place], and log in with your MyChart username and password.

## 2023-06-11 ENCOUNTER — Other Ambulatory Visit: Payer: Self-pay

## 2023-06-12 ENCOUNTER — Inpatient Hospital Stay: Payer: MEDICAID

## 2023-06-12 VITALS — BP 99/68 | HR 118 | Temp 97.1°F | Resp 16

## 2023-06-12 DIAGNOSIS — C186 Malignant neoplasm of descending colon: Secondary | ICD-10-CM

## 2023-06-12 DIAGNOSIS — Z5111 Encounter for antineoplastic chemotherapy: Secondary | ICD-10-CM | POA: Diagnosis not present

## 2023-06-12 MED ORDER — HEPARIN SOD (PORK) LOCK FLUSH 100 UNIT/ML IV SOLN
500.0000 [IU] | Freq: Once | INTRAVENOUS | Status: AC | PRN
  Administered 2023-06-12: 500 [IU]

## 2023-06-12 MED ORDER — SODIUM CHLORIDE 0.9% FLUSH
10.0000 mL | INTRAVENOUS | Status: DC | PRN
  Administered 2023-06-12: 10 mL

## 2023-06-24 ENCOUNTER — Inpatient Hospital Stay: Payer: MEDICAID

## 2023-06-24 ENCOUNTER — Inpatient Hospital Stay: Payer: MEDICAID | Admitting: Hematology

## 2023-06-24 ENCOUNTER — Encounter: Payer: Self-pay | Admitting: Hematology

## 2023-06-24 VITALS — BP 101/72 | HR 93 | Temp 100.1°F | Resp 18 | Ht 69.0 in | Wt 152.1 lb

## 2023-06-24 DIAGNOSIS — C186 Malignant neoplasm of descending colon: Secondary | ICD-10-CM | POA: Diagnosis not present

## 2023-06-24 DIAGNOSIS — Z5111 Encounter for antineoplastic chemotherapy: Secondary | ICD-10-CM | POA: Diagnosis not present

## 2023-06-24 DIAGNOSIS — R509 Fever, unspecified: Secondary | ICD-10-CM

## 2023-06-24 DIAGNOSIS — F209 Schizophrenia, unspecified: Secondary | ICD-10-CM | POA: Diagnosis not present

## 2023-06-24 DIAGNOSIS — Z95828 Presence of other vascular implants and grafts: Secondary | ICD-10-CM

## 2023-06-24 DIAGNOSIS — C189 Malignant neoplasm of colon, unspecified: Secondary | ICD-10-CM

## 2023-06-24 LAB — CBC WITH DIFFERENTIAL (CANCER CENTER ONLY)
Abs Immature Granulocytes: 0.01 10*3/uL (ref 0.00–0.07)
Basophils Absolute: 0 10*3/uL (ref 0.0–0.1)
Basophils Relative: 1 %
Eosinophils Absolute: 0.2 10*3/uL (ref 0.0–0.5)
Eosinophils Relative: 3 %
HCT: 35.5 % — ABNORMAL LOW (ref 39.0–52.0)
Hemoglobin: 11.6 g/dL — ABNORMAL LOW (ref 13.0–17.0)
Immature Granulocytes: 0 %
Lymphocytes Relative: 19 %
Lymphs Abs: 1.1 10*3/uL (ref 0.7–4.0)
MCH: 26 pg (ref 26.0–34.0)
MCHC: 32.7 g/dL (ref 30.0–36.0)
MCV: 79.4 fL — ABNORMAL LOW (ref 80.0–100.0)
Monocytes Absolute: 0.4 10*3/uL (ref 0.1–1.0)
Monocytes Relative: 7 %
Neutro Abs: 4.3 10*3/uL (ref 1.7–7.7)
Neutrophils Relative %: 70 %
Platelet Count: 371 10*3/uL (ref 150–400)
RBC: 4.47 MIL/uL (ref 4.22–5.81)
RDW: 20.4 % — ABNORMAL HIGH (ref 11.5–15.5)
WBC Count: 6 10*3/uL (ref 4.0–10.5)
nRBC: 0 % (ref 0.0–0.2)

## 2023-06-24 LAB — MAGNESIUM: Magnesium: 1.6 mg/dL — ABNORMAL LOW (ref 1.7–2.4)

## 2023-06-24 LAB — URINALYSIS, COMPLETE (UACMP) WITH MICROSCOPIC
Bacteria, UA: NONE SEEN
Bilirubin Urine: NEGATIVE
Glucose, UA: NEGATIVE mg/dL
Hgb urine dipstick: NEGATIVE
Ketones, ur: NEGATIVE mg/dL
Leukocytes,Ua: NEGATIVE
Nitrite: NEGATIVE
Protein, ur: NEGATIVE mg/dL
Specific Gravity, Urine: 1.015 (ref 1.005–1.030)
pH: 5 (ref 5.0–8.0)

## 2023-06-24 LAB — CMP (CANCER CENTER ONLY)
ALT: 14 U/L (ref 0–44)
AST: 14 U/L — ABNORMAL LOW (ref 15–41)
Albumin: 3.9 g/dL (ref 3.5–5.0)
Alkaline Phosphatase: 120 U/L (ref 38–126)
Anion gap: 7 (ref 5–15)
BUN: 8 mg/dL (ref 6–20)
CO2: 26 mmol/L (ref 22–32)
Calcium: 9.3 mg/dL (ref 8.9–10.3)
Chloride: 107 mmol/L (ref 98–111)
Creatinine: 0.72 mg/dL (ref 0.61–1.24)
GFR, Estimated: >60 mL/min (ref >60)
Glucose, Bld: 98 mg/dL (ref 70–99)
Potassium: 3.9 mmol/L (ref 3.5–5.1)
Sodium: 140 mmol/L (ref 135–145)
Total Bilirubin: 0.9 mg/dL (ref <1.2)
Total Protein: 6.8 g/dL (ref 6.5–8.1)

## 2023-06-24 MED ORDER — SODIUM CHLORIDE 0.9% FLUSH: 10.0000 mL | INTRAVENOUS | Status: DC | PRN

## 2023-06-24 MED ORDER — SODIUM CHLORIDE 0.9 % IV SOLN
2400.0000 mg/m2 | INTRAVENOUS | Status: DC
  Administered 2023-06-24: 4350 mg via INTRAVENOUS
  Filled 2023-06-24: qty 87

## 2023-06-24 MED ORDER — SODIUM CHLORIDE 0.9% FLUSH
10.0000 mL | Freq: Once | INTRAVENOUS | Status: AC
  Administered 2023-06-24: 10 mL

## 2023-06-24 MED ORDER — SODIUM CHLORIDE 0.9 % IV SOLN: Freq: Once | INTRAVENOUS | Status: AC

## 2023-06-24 MED ORDER — SODIUM CHLORIDE 0.9 % IV SOLN
400.0000 mg/m2 | Freq: Once | INTRAVENOUS | Status: AC
  Administered 2023-06-24: 728 mg via INTRAVENOUS
  Filled 2023-06-24: qty 25

## 2023-06-24 MED ORDER — PALONOSETRON HCL INJECTION 0.25 MG/5ML
0.2500 mg | Freq: Once | INTRAVENOUS | Status: AC
  Administered 2023-06-24: 0.25 mg via INTRAVENOUS
  Filled 2023-06-24: qty 5

## 2023-06-24 MED ORDER — DEXAMETHASONE SODIUM PHOSPHATE 10 MG/ML IJ SOLN
10.0000 mg | Freq: Once | INTRAMUSCULAR | Status: AC
  Administered 2023-06-24: 10 mg via INTRAVENOUS
  Filled 2023-06-24: qty 1

## 2023-06-24 MED ORDER — MAGNESIUM SULFATE 2 GM/50ML IV SOLN
2.0000 g | Freq: Once | INTRAVENOUS | Status: AC
  Administered 2023-06-24: 2 g via INTRAVENOUS
  Filled 2023-06-24: qty 50

## 2023-06-24 MED ORDER — HEPARIN SOD (PORK) LOCK FLUSH 100 UNIT/ML IV SOLN: 500.0000 [IU] | Freq: Once | INTRAVENOUS | Status: DC | PRN

## 2023-06-24 MED ORDER — SODIUM CHLORIDE 0.9 % IV SOLN
6.0000 mg/kg | Freq: Once | INTRAVENOUS | Status: AC
  Administered 2023-06-24: 400 mg via INTRAVENOUS
  Filled 2023-06-24: qty 20

## 2023-06-24 NOTE — Patient Instructions (Signed)
 [facility] - [facility] DEPT [facility]  Discharge Instructions: Thank you for choosing [facility] to provide your oncology and hematology care.   If you have a lab appointment with the Cancer Center, please go directly to the Cancer Center and check in at the registration area.   Wear comfortable clothing and clothing appropriate for easy access to any Portacath or PICC line.   We strive to give you quality time with your provider. You may need to reschedule your appointment if you arrive late (15 or more minutes).  Arriving late affects you and other patients whose appointments are after yours.  Also, if you miss three or more appointments without notifying the office, you may be dismissed from the clinic at the provider's discretion.      For prescription refill requests, have your pharmacy contact our office and allow 72 hours for refills to be completed.    Today you received the following chemotherapy and/or immunotherapy agents: Vectibix, Leucovorin, Fluorouracil.       To help prevent nausea and vomiting after your treatment, we encourage you to take your nausea medication as directed.  BELOW ARE SYMPTOMS THAT SHOULD BE REPORTED IMMEDIATELY: *FEVER GREATER THAN 100.4 F (38 C) OR HIGHER *CHILLS OR SWEATING *NAUSEA AND VOMITING THAT IS NOT CONTROLLED WITH YOUR NAUSEA MEDICATION *UNUSUAL SHORTNESS OF BREATH *UNUSUAL BRUISING OR BLEEDING *URINARY PROBLEMS (pain or burning when urinating, or frequent urination) *BOWEL PROBLEMS (unusual diarrhea, constipation, pain near the anus) TENDERNESS IN MOUTH AND THROAT WITH OR WITHOUT PRESENCE OF ULCERS (sore throat, sores in mouth, or a toothache) UNUSUAL RASH, SWELLING OR PAIN  UNUSUAL VAGINAL DISCHARGE OR ITCHING   Items with * indicate a potential emergency and should be followed up as soon as possible or go to the Emergency Department if any problems should occur.  Please show the CHEMOTHERAPY  ALERT CARD or IMMUNOTHERAPY ALERT CARD at check-in to the Emergency Department and triage nurse.  Should you have questions after your visit or need to cancel or reschedule your appointment, please contact [facility] - A DEPT OF [facility]  Dept: [phone]  and follow the prompts.  Office hours are 8:00 a.m. to 4:30 p.m. Monday - Friday. Please note that voicemails left after 4:00 p.m. may not be returned until the following business day.  We are closed weekends and major holidays. You have access to a nurse at all times for urgent questions. Please call the main number to the clinic Dept: [phone] and follow the prompts.   For any non-urgent questions, you may also contact your provider using MyChart. We now offer e-Visits for anyone 55 and older to request care online for non-urgent symptoms. For details visit [email or web address].   Also download the MyChart app! Go to the app store, search "MyChart", open the app, select [place], and log in with your MyChart username and password.

## 2023-06-24 NOTE — Progress Notes (Signed)
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 -we have changed his treatment to maintenance therapy with 5-fu and vectibix in late March 2024 -He is tolerating treatment very well, will continue. -due to metastasis in both lungs and retroperitoneal lymph nodes, he is not a candidate for surgery or local therapy  -restaging CT 04/23/2023 showed stable disease   Schizophrenia (HCC) he was diagnosed with schizophrenia in his 20's, managed with medication, he is on disability. -he lives with his mother; he is able to do ADLs for himself, but not iADLS. They are both on disability. His mother also seems to have some kind of mental impairment    Assessment and Plan    Metastatic Colon Cancer 57 year old undergoing chemotherapy for metastatic colon cancer with lung involvement. No significant side effects from the last cycle. Lab results: hemoglobin 11.6, normal WBC, normal kidney  and liver functions, pending magnesium level. Scheduled for a CT scan in December. Discussed ongoing treatment due to metastatic nature, with possible breaks during holidays or travel. Moving to [facility] and will transfer care to [facility]. Informed consent provided regarding chronic nature and continuous treatment. - Continue chemotherapy as scheduled - Order CT scan in December - Check magnesium level - Ensure adherence to prescribed medications including Eliquis, potassium, and magnesium - Schedule follow-up appointment in two weeks  Hypertension Well-managed hypertension, no specific issues discussed during this visit. - Continue current hypertension management  Schizophrenia Under psychiatric care with regular injections, functioning normally with no reported issues. - Continue psychiatric care and regular injections  General Health Maintenance Recommended flu shot, not administered during this visit. - Administer flu shot at a later date  Plan -Lab reviewed, adequate for treatment, will proceed chemotherapy today - Schedule CT scan in December - Follow-up appointments on December 5th and 19th - Pt's mother will ensure transportation arrangements for appointments - Transfer care to [facility] after the move.         SUMMARY OF ONCOLOGIC HISTORY: Oncology History Overview Note   Cancer Staging  Cancer of left colon [facility]) Staging form: Colon and Rectum, AJCC 8th Edition - Pathologic stage from 08/08/2021: Stage IIIB (pT3, pN1a, cM0) - Signed by Malachy Mood, MD on 08/26/2021    Cancer of left colon (HCC)  04/01/2020 Imaging   IMPRESSION: 1. Gallbladder decompressed bowel also partially calcified gallstones. No pericholecystic inflammation though if there is persisting clinical concern for cholecystitis right upper quadrant ultrasound could be obtained. 2. Circumferential thickening of the distal thoracic esophagus. Could reflect features of esophagitis. Correlate with  clinical symptoms and consider endoscopy as clinically warranted. 3. Additional segmental thickening of the mid to distal sigmoid with focal narrowing. No acute surrounding inflammation or resulting obstruction. Findings are nonspecific, and could reflect sequela of prior inflammation/infection. However, recommend correlation with colonoscopy if not recently performed. 4. Mild circumferential bladder wall thickening and indentation of the bladder base by an enlarged prostate. Possibly sequela of chronic outlet obstruction though could correlate with urinalysis to exclude cystitis. 5. Aortic Atherosclerosis (ICD10-I70.0).   08/06/2021 Imaging   IMPRESSION: Sigmoid colonic perforation with small free intraperitoneal gas and infiltration of the mesenteric and omental fat in keeping with changes of peritonitis.   Long segment inflammatory stranding of the sigmoid colon in keeping with a severe infectious or inflammatory colitis. This terminates an area of irregular mural thickening, infiltrative soft tissue within the colonic mesentery, and focal dystrophic calcification. This may represent a chronic inflammatory process, however, a perforated malignancy could appear similarly. There are 2 separate points of perforation which again raise the question of an underlying malignancy.   7.1 cm gas and fluid containing pericolonic abscess within the sigmoid mesentery.   Marked inflammatory change of the terminal ileum adjacent to the pericolonic abscess with resultant small bowel obstruction. Fluid within the distal esophagus likely relates to gastroesophageal reflux the setting of vomiting.   Aortic Atherosclerosis (ICD10-I70.0).   08/08/2021 Cancer Staging   Staging form: Colon and Rectum, AJCC 8th Edition - Pathologic stage from 08/08/2021: Stage IIIB (pT3, pN1a, cM0) - Signed by Malachy Mood, MD on 08/26/2021 Stage prefix: Initial diagnosis Total positive nodes: 1 Histologic grading system: 4  grade system Histologic grade (G): G2 Residual tumor (R): R0 - None   08/08/2021 Definitive Surgery   FINAL MICROSCOPIC DIAGNOSIS:   A. COLON, SIGMOID, PARTIAL COLECTOMY:  - Invasive moderately differentiated adenocarcinoma.  - Metastatic carcinoma involving one of twelve lymph nodes (1/12).  - See oncology table below.   ADDENDUM:  Mismatch Repair Protein (IHC)  SUMMARY INTERPRETATION: NORMAL    08/14/2021 Imaging   EXAM: CT ABDOMEN AND PELVIS WITH CONTRAST  IMPRESSION: 1. Post recent sigmoid colectomy with left lower quadrant colostomy. Two small residual foci of air within the pelvic mesentery with mild adjacent thickening, but no abscess or drainable collection. Trace non organized free fluid and stranding in the pelvis. 2. Short segment of small bowel wall thickening and inflammation in the pelvis involving the distal ileum, likely reactive. 3. Dilated distal esophagus, stomach, and small bowel, without discrete transition point, favoring postoperative ileus. 4. Small bilateral pleural effusions and compressive atelectasis. 5. Heterogeneous partially enhancing 14 mm lymph node in the retroperitoneum at the aortoiliac bifurcation, not significantly changed from prior exam. Suspected additional lymph nodes in the anterior common iliac space, not significantly changed from prior exam. Recommend attention at follow-up. 6. Additional chronic findings as described.     08/15/2021 Imaging   EXAM: CT CHEST WITH CONTRAST  IMPRESSION: 1. No evidence of thoracic metastasis. 2. Bilateral small layering pleural effusions with passive atelectasis   08/26/2021 Initial Diagnosis   Cancer of left colon (HCC)   10/10/2021 - 12/12/2021 Chemotherapy   Patient is on Treatment Plan : COLORECTAL Xelox (Capeox) q21d     05/28/2022 - 05/28/2022 Chemotherapy   Patient is on Treatment Plan : COLORECTAL FOLFIRI + Bevacizumab q14d     05/28/2022 -  Chemotherapy   Patient is on Treatment Plan :  COLORECTAL FOLFIRI + Panitumumab q14d     07/31/2022 Imaging    IMPRESSION: Decreased bilateral  pulmonary metastases.   Stable mild abdominal lymphadenopathy.   No new or progressive metastatic disease within the chest, abdomen, or pelvis.   01/18/2023 Imaging    IMPRESSION: 1. Status post Gertie Gowda pouch sigmoid colon resection with left lower quadrant end colostomy and rectal stump. Unchanged, mild wall thickening of the decompressed distal colon at the ileostomy, as well as the rectal stump. 2. Unchanged small, treated metastatic pulmonary nodules. No new nodules. 3. Unchanged enlarged, coarsely calcified treated lower aortocaval metastatic lymph nodes. No new lymphadenopathy. 4. Coronary artery disease. 5. Cholelithiasis.   04/23/2023 Imaging   CT chest, abdomen, and pelvis with contrast  IMPRESSION: 1. Left lower lobe segmental pulmonary emboli, incidental finding. No evidence for right heart strain. 2. Stable trace bilateral pleural effusions. 3. Stable enlarged lower left paraesophageal lymph node. 4. Stable calcified retroperitoneal lymph nodes. No new enlarged lymph nodes in the chest, abdomen or pelvis. 5. Stable subcentimeter soft tissue nodules within the mesenteric fat along the inferior ostomy. 6. Left Bosniak I benign renal cyst measuring 4.6 cm. No follow-up imaging is recommended. JACR 2018 Feb; 264-273, Management of the Incidental Renal Mass on CT, [employer] 2021; 814-848, Bosniak Classification of Cystic Renal Masses, Version 2019.      Discussed the use of AI scribe software for clinical note transcription with the patient, who gave verbal consent to proceed.  History of Present Illness   The patient is a 57 year old male with a history of metastatic colon cancer and schizophrenia, currently under the care of his mother. He is here for a routine follow-up visit. The patient's mother reports that he is doing okay overall, with no new or worsening  symptoms. He denies any recent episodes of fever, chills, diarrhea, or nausea. The patient is eating and sleeping well, and is able to move around without difficulty. The patient's mother also mentions that he has a rash, which is not causing significant discomfort. The patient is currently receiving chemotherapy through a port, which appears to be in good condition. The patient's mother also mentions that he is planning to move to a different location soon, which may require a change in the patient's care team.         All other systems were reviewed with the patient and are negative.  MEDICAL HISTORY:  Past Medical History:  Diagnosis Date   Cancer (HCC)    Schizophrenia [facility])     SURGICAL HISTORY: Past Surgical History:  Procedure Laterality Date   BRONCHIAL BIOPSY  05/11/2022   Procedure: BRONCHIAL BIOPSIES;  Surgeon: Leslye Peer, MD;  Location: [facility] ENDOSCOPY;  Service: Pulmonary;;   BRONCHIAL BRUSHINGS  05/11/2022   Procedure: BRONCHIAL BRUSHINGS;  Surgeon: Leslye Peer, MD;  Location: [facility] ENDOSCOPY;  Service: Pulmonary;;   BRONCHIAL NEEDLE ASPIRATION BIOPSY  05/11/2022   Procedure: BRONCHIAL NEEDLE ASPIRATION BIOPSIES;  Surgeon: Leslye Peer, MD;  Location: [facility] ENDOSCOPY;  Service: Pulmonary;;   BRONCHIAL WASHINGS  05/11/2022   Procedure: BRONCHIAL WASHINGS;  Surgeon: Leslye Peer, MD;  Location: [facility] ENDOSCOPY;  Service: Pulmonary;;   IR IMAGING GUIDED PORT INSERTION  05/25/2022   LAPAROTOMY N/A 08/08/2021   Procedure: EXPLORATORY LAPAROTOMY;  Surgeon: Berna Bue, MD;  Location: [facility] OR;  Service: General;  Laterality: N/A;   PARTIAL COLECTOMY N/A 08/08/2021   Procedure: PARTIAL COLECTOMY WITH COLOSTOMY;  Surgeon: Berna Bue, MD;  Location: [facility] OR;  Service: General;  Laterality: N/A;   VIDEO BRONCHOSCOPY WITH RADIAL ENDOBRONCHIAL ULTRASOUND  05/11/2022   Procedure: VIDEO  BRONCHOSCOPY WITH RADIAL ENDOBRONCHIAL ULTRASOUND;  Surgeon: Leslye Peer, MD;  Location: [facility]  ENDOSCOPY;  Service: Pulmonary;;    I have reviewed the social history and family history with the patient and they are unchanged from previous note.  ALLERGIES:  has No Known Allergies.  MEDICATIONS:  Current Outpatient Medications  Medication Sig Dispense Refill   acetaminophen (TYLENOL) 500 MG tablet Take 2 tablets (1,000 mg total) by mouth every 6 (six) hours as needed for mild pain or moderate pain.  0   apixaban (ELIQUIS) 5 MG TABS tablet Take 1 tablet (5 mg total) by mouth 2 (two) times daily. 60 tablet 1   aspirin EC 81 MG tablet Take 81 mg by mouth daily as needed (for pain or headaches).      benztropine (COGENTIN) 1 MG tablet Take 1 mg at bedtime by mouth.     clindamycin (CLINDAGEL) 1 % gel Apply topically 2 (two) times daily. To skin rash on face and upper body 30 g 0   ferrous sulfate 325 (65 FE) MG EC tablet Take 1 tablet (325 mg total) by mouth daily. 30 tablet 3   hydrocortisone cream 1 % Apply 1 Application topically 2 (two) times daily as needed for itching. For rash 30 g 2   loperamide (IMODIUM) 2 MG capsule Take 1 capsule (2 mg total) by mouth as needed for diarrhea or loose stools. 30 capsule 0   magnesium oxide (MAG-OX) 400 (240 Mg) MG tablet Take 1 tablet (400 mg total) by mouth daily. 30 tablet 2   Multiple Vitamin (MULTIVITAMIN WITH MINERALS) TABS tablet Take 1 tablet by mouth daily.     ondansetron (ZOFRAN) 8 MG tablet Take 1 tablet (8 mg total) by mouth every 8 (eight) hours as needed for nausea or vomiting. 30 tablet 0   paliperidone (INVEGA SUSTENNA) 156 MG/ML SUSP injection Inject 156 mg every 30 (thirty) days into the muscle.     polyethylene glycol (MIRALAX / GLYCOLAX) 17 g packet Take 17 g by mouth daily as needed for mild constipation or moderate constipation.  0   potassium chloride SA (KLOR-CON M) 20 MEQ tablet TAKE 1 TABLET(20 MEQ) BY MOUTH DAILY 30 tablet 2   prochlorperazine (COMPAZINE) 10 MG tablet TAKE 1 TABLET(10 MG) BY MOUTH EVERY 6 HOURS AS NEEDED  FOR NAUSEA OR VOMITING 30 tablet 0   No current facility-administered medications for this visit.   Facility-Administered Medications Ordered in Other Visits  Medication Dose Route Frequency Provider Last Rate Last Admin   fluorouracil (ADRUCIL) 4,350 mg in sodium chloride 0.9 % 63 mL chemo infusion  2,400 mg/m2 (Treatment Plan Recorded) Intravenous 1 day or 1 dose Malachy Mood, MD       heparin lock flush 100 unit/mL  500 Units Intracatheter Once PRN Malachy Mood, MD       leucovorin 728 mg in sodium chloride 0.9 % 250 mL infusion  400 mg/m2 (Treatment Plan Recorded) Intravenous Once Malachy Mood, MD       sodium chloride flush (NS) 0.9 % injection 10 mL  10 mL Intracatheter PRN Malachy Mood, MD        PHYSICAL EXAMINATION: ECOG PERFORMANCE STATUS: 1 - Symptomatic but completely ambulatory  Vitals:   06/24/23 1128  BP: 101/72  Pulse: 93  Resp: 18  Temp: 100.1 F (37.8 C)  SpO2: 100%   Wt Readings from Last 3 Encounters:  06/24/23 152 lb 1.6 oz (69 kg)  06/10/23 152 lb 1.6 oz (69 kg)  05/26/23 153  lb (69.4 kg)     GENERAL:alert, no distress and comfortable SKIN: skin color, texture, turgor are normal, scattered acne-like rash on face neck and front chest EYES: normal, Conjunctiva are pink and non-injected, sclera clear NECK: supple, thyroid normal size, non-tender, without nodularity LYMPH:  no palpable lymphadenopathy in the cervical, axillary  LUNGS: clear to auscultation and percussion with normal breathing effort HEART: regular rate & rhythm and no murmurs and no lower extremity edema ABDOMEN:abdomen soft, non-tender and normal bowel sounds Musculoskeletal:no cyanosis of digits and no clubbing  NEURO: alert & oriented x 3 with fluent speech, no focal motor/sensory deficits      LABORATORY DATA:  I have reviewed the data as listed    Latest Ref Rng & Units 06/24/2023   10:36 AM 06/10/2023   11:24 AM 05/26/2023    8:55 AM  CBC  WBC 4.0 - 10.5 K/uL 6.0  3.8  4.5   Hemoglobin  13.0 - 17.0 g/dL 19.1  47.8  29.5   Hematocrit 39.0 - 52.0 % 35.5  34.3  37.3   Platelets 150 - 400 K/uL 371  372  361         Latest Ref Rng & Units 06/24/2023   10:36 AM 06/10/2023   11:24 AM 05/26/2023    8:55 AM  CMP  Glucose 70 - 99 mg/dL 98  91  91   BUN 6 - 20 mg/dL 8  8  17    Creatinine 0.61 - 1.24 mg/dL 6.21  3.08  6.57   Sodium 135 - 145 mmol/L 140  139  138   Potassium 3.5 - 5.1 mmol/L 3.9  3.6  3.4   Chloride 98 - 111 mmol/L 107  107  107   CO2 22 - 32 mmol/L 26  25  25    Calcium 8.9 - 10.3 mg/dL 9.3  9.3  9.1   Total Protein 6.5 - 8.1 g/dL 6.8  6.7  6.6   Total Bilirubin <1.2 mg/dL 0.9  0.8  0.5   Alkaline Phos 38 - 126 U/L 120  111  101   AST 15 - 41 U/L 14  22  19    ALT 0 - 44 U/L 14  20  20        RADIOGRAPHIC STUDIES: I have personally reviewed the radiological images as listed and agreed with the findings in the report. No results found.    Orders Placed This Encounter  Procedures   Culture, blood (routine x 2)   CT CHEST ABDOMEN PELVIS W CONTRAST    Standing Status:   Future    Standing Expiration Date:   06/23/2024    Order Specific Question:   If indicated for the ordered procedure, I authorize the administration of contrast media per Radiology protocol    Answer:   Yes    Order Specific Question:   Does the patient have a contrast media/X-ray dye allergy?    Answer:   No    Order Specific Question:   Preferred imaging location?    Answer:   [facility]    Order Specific Question:   If indicated for the ordered procedure, I authorize the administration of oral contrast media per Radiology protocol    Answer:   Yes   CBC with Differential (Cancer Center Only)    Standing Status:   Future    Standing Expiration Date:   08/18/2024   CMP (Cancer Center only)    Standing Status:   Future  Standing Expiration Date:   08/18/2024   Magnesium    Standing Status:   Future    Standing Expiration Date:   08/18/2024   Urinalysis, Complete w  Microscopic    Standing Status:   Future    Standing Expiration Date:   06/23/2024   All questions were answered. The patient knows to call the clinic with any problems, questions or concerns. No barriers to learning was detected. The total time spent in the appointment was 25 minutes.     Malachy Mood, MD 06/24/2023

## 2023-06-24 NOTE — Assessment & Plan Note (Signed)
stage IIIB p(T3, N1aM0), MSS, KRAS/NRAS/BRAF wild type, lung and node metastasis in 04/2022  -Initially diagnosed in 08/2021 -he completed 4 cycles adjuvant CAPOX 10/10/21 - 12/25/21 (though he somehow was still taking Xeloda through ~02/09/22 due to misunderstandings).  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 -we have changed his treatment to maintenance therapy with 5-fu and vectibix in late March 2024 -He is tolerating treatment very well, will continue. -due to metastasis in both lungs and retroperitoneal lymph nodes, he is not a candidate for surgery or local therapy  -restaging CT 04/23/2023 showed stable disease

## 2023-06-24 NOTE — Assessment & Plan Note (Signed)
he was diagnosed with schizophrenia in his 20's, managed with medication, he is on disability. -he lives with his mother; he is able to do ADLs for himself, but not iADLS. They are both on disability. His mother also seems to have some kind of mental impairment 

## 2023-06-25 LAB — URINE CULTURE: Culture: NO GROWTH

## 2023-06-26 ENCOUNTER — Inpatient Hospital Stay: Payer: MEDICAID

## 2023-06-26 VITALS — BP 98/71 | HR 109 | Temp 97.7°F | Resp 18

## 2023-06-26 DIAGNOSIS — Z5111 Encounter for antineoplastic chemotherapy: Secondary | ICD-10-CM | POA: Diagnosis not present

## 2023-06-26 DIAGNOSIS — C186 Malignant neoplasm of descending colon: Secondary | ICD-10-CM

## 2023-06-26 MED ORDER — HEPARIN SOD (PORK) LOCK FLUSH 100 UNIT/ML IV SOLN
500.0000 [IU] | Freq: Once | INTRAVENOUS | Status: AC | PRN
  Administered 2023-06-26: 500 [IU]

## 2023-06-26 MED ORDER — SODIUM CHLORIDE 0.9% FLUSH
10.0000 mL | INTRAVENOUS | Status: DC | PRN
  Administered 2023-06-26: 10 mL

## 2023-06-29 LAB — CULTURE, BLOOD (ROUTINE X 2)
Culture: NO GROWTH
Special Requests: ADEQUATE

## 2023-07-08 ENCOUNTER — Encounter: Payer: Self-pay | Admitting: Hematology

## 2023-07-08 ENCOUNTER — Inpatient Hospital Stay (HOSPITAL_BASED_OUTPATIENT_CLINIC_OR_DEPARTMENT_OTHER): Payer: MEDICAID | Admitting: Hematology

## 2023-07-08 ENCOUNTER — Inpatient Hospital Stay: Payer: MEDICAID

## 2023-07-08 ENCOUNTER — Inpatient Hospital Stay: Payer: MEDICAID | Attending: Hematology

## 2023-07-08 VITALS — BP 120/82 | HR 92 | Temp 97.2°F | Resp 16 | Wt 153.9 lb

## 2023-07-08 DIAGNOSIS — C7801 Secondary malignant neoplasm of right lung: Secondary | ICD-10-CM | POA: Diagnosis not present

## 2023-07-08 DIAGNOSIS — C186 Malignant neoplasm of descending colon: Secondary | ICD-10-CM

## 2023-07-08 DIAGNOSIS — Z79899 Other long term (current) drug therapy: Secondary | ICD-10-CM | POA: Diagnosis not present

## 2023-07-08 DIAGNOSIS — C189 Malignant neoplasm of colon, unspecified: Secondary | ICD-10-CM

## 2023-07-08 DIAGNOSIS — D500 Iron deficiency anemia secondary to blood loss (chronic): Secondary | ICD-10-CM

## 2023-07-08 DIAGNOSIS — Z95828 Presence of other vascular implants and grafts: Secondary | ICD-10-CM

## 2023-07-08 DIAGNOSIS — C7802 Secondary malignant neoplasm of left lung: Secondary | ICD-10-CM | POA: Insufficient documentation

## 2023-07-08 DIAGNOSIS — Z5112 Encounter for antineoplastic immunotherapy: Secondary | ICD-10-CM | POA: Insufficient documentation

## 2023-07-08 DIAGNOSIS — D649 Anemia, unspecified: Secondary | ICD-10-CM | POA: Insufficient documentation

## 2023-07-08 DIAGNOSIS — Z5111 Encounter for antineoplastic chemotherapy: Secondary | ICD-10-CM | POA: Insufficient documentation

## 2023-07-08 LAB — CMP (CANCER CENTER ONLY)
ALT: 23 U/L (ref 0–44)
AST: 19 U/L (ref 15–41)
Albumin: 3.9 g/dL (ref 3.5–5.0)
Alkaline Phosphatase: 103 U/L (ref 38–126)
Anion gap: 5 (ref 5–15)
BUN: 10 mg/dL (ref 6–20)
CO2: 25 mmol/L (ref 22–32)
Calcium: 9.1 mg/dL (ref 8.9–10.3)
Chloride: 107 mmol/L (ref 98–111)
Creatinine: 0.68 mg/dL (ref 0.61–1.24)
GFR, Estimated: >60 mL/min (ref >60)
Glucose, Bld: 109 mg/dL — ABNORMAL HIGH (ref 70–99)
Potassium: 3.5 mmol/L (ref 3.5–5.1)
Sodium: 137 mmol/L (ref 135–145)
Total Bilirubin: 0.6 mg/dL (ref <1.2)
Total Protein: 6.4 g/dL — ABNORMAL LOW (ref 6.5–8.1)

## 2023-07-08 LAB — CBC WITH DIFFERENTIAL (CANCER CENTER ONLY)
Abs Immature Granulocytes: 0.01 10*3/uL (ref 0.00–0.07)
Basophils Absolute: 0 10*3/uL (ref 0.0–0.1)
Basophils Relative: 1 %
Eosinophils Absolute: 0.1 10*3/uL (ref 0.0–0.5)
Eosinophils Relative: 2 %
HCT: 33.6 % — ABNORMAL LOW (ref 39.0–52.0)
Hemoglobin: 10.9 g/dL — ABNORMAL LOW (ref 13.0–17.0)
Immature Granulocytes: 0 %
Lymphocytes Relative: 20 %
Lymphs Abs: 0.9 10*3/uL (ref 0.7–4.0)
MCH: 25.5 pg — ABNORMAL LOW (ref 26.0–34.0)
MCHC: 32.4 g/dL (ref 30.0–36.0)
MCV: 78.7 fL — ABNORMAL LOW (ref 80.0–100.0)
Monocytes Absolute: 0.4 10*3/uL (ref 0.1–1.0)
Monocytes Relative: 8 %
Neutro Abs: 3.2 10*3/uL (ref 1.7–7.7)
Neutrophils Relative %: 69 %
Platelet Count: 367 10*3/uL (ref 150–400)
RBC: 4.27 MIL/uL (ref 4.22–5.81)
RDW: 21.3 % — ABNORMAL HIGH (ref 11.5–15.5)
WBC Count: 4.6 10*3/uL (ref 4.0–10.5)
nRBC: 0 % (ref 0.0–0.2)

## 2023-07-08 LAB — MAGNESIUM: Magnesium: 1.4 mg/dL — ABNORMAL LOW (ref 1.7–2.4)

## 2023-07-08 MED ORDER — HEPARIN SOD (PORK) LOCK FLUSH 100 UNIT/ML IV SOLN: 500.0000 [IU] | Freq: Once | INTRAVENOUS | Status: DC | PRN

## 2023-07-08 MED ORDER — SODIUM CHLORIDE 0.9 % IV SOLN
2400.0000 mg/m2 | INTRAVENOUS | Status: DC
  Administered 2023-07-08: 4350 mg via INTRAVENOUS
  Filled 2023-07-08: qty 87

## 2023-07-08 MED ORDER — MAGNESIUM SULFATE 2 GM/50ML IV SOLN
2.0000 g | Freq: Once | INTRAVENOUS | Status: AC
Start: 2023-07-08 — End: 2023-07-08
  Administered 2023-07-08: 2 g via INTRAVENOUS
  Filled 2023-07-08: qty 50

## 2023-07-08 MED ORDER — DEXAMETHASONE SODIUM PHOSPHATE 10 MG/ML IJ SOLN
10.0000 mg | Freq: Once | INTRAMUSCULAR | Status: AC
  Administered 2023-07-08: 10 mg via INTRAVENOUS
  Filled 2023-07-08: qty 1

## 2023-07-08 MED ORDER — SODIUM CHLORIDE 0.9% FLUSH
10.0000 mL | INTRAVENOUS | Status: DC | PRN
  Administered 2023-07-08: 10 mL

## 2023-07-08 MED ORDER — SODIUM CHLORIDE 0.9 % IV SOLN
6.0000 mg/kg | Freq: Once | INTRAVENOUS | Status: AC
  Administered 2023-07-08: 400 mg via INTRAVENOUS
  Filled 2023-07-08: qty 20

## 2023-07-08 MED ORDER — PALONOSETRON HCL INJECTION 0.25 MG/5ML
0.2500 mg | Freq: Once | INTRAVENOUS | Status: AC
  Administered 2023-07-08: 0.25 mg via INTRAVENOUS
  Filled 2023-07-08: qty 5

## 2023-07-08 MED ORDER — LEUCOVORIN CALCIUM INJECTION 350 MG
400.0000 mg/m2 | Freq: Once | INTRAMUSCULAR | Status: AC
  Administered 2023-07-08: 728 mg via INTRAVENOUS
  Filled 2023-07-08: qty 25

## 2023-07-08 MED ORDER — SODIUM CHLORIDE 0.9 % IV SOLN: Freq: Once | INTRAVENOUS | Status: AC

## 2023-07-08 MED ORDER — SODIUM CHLORIDE 0.9% FLUSH
10.0000 mL | Freq: Once | INTRAVENOUS | Status: AC
  Administered 2023-07-08: 10 mL

## 2023-07-08 NOTE — Patient Instructions (Signed)
[facility] - A DEPT OF [facility]  Discharge Instructions: Thank you for choosing [facility] to provide your oncology and hematology care.   If you have a lab appointment with the Cancer Center, please go directly to the Cancer Center and check in at the registration area.   Wear comfortable clothing and clothing appropriate for easy access to any Portacath or PICC line.   We strive to give you quality time with your provider. You may need to reschedule your appointment if you arrive late (15 or more minutes).  Arriving late affects you and other patients whose appointments are after yours.  Also, if you miss three or more appointments without notifying the office, you may be dismissed from the clinic at the provider's discretion.      For prescription refill requests, have your pharmacy contact our office and allow 72 hours for refills to be completed.    Today you received the following chemotherapy and/or immunotherapy agents: Vectibix, Leucovorin, 5FU      To help prevent nausea and vomiting after your treatment, we encourage you to take your nausea medication as directed.  BELOW ARE SYMPTOMS THAT SHOULD BE REPORTED IMMEDIATELY: *FEVER GREATER THAN 100.4 F (38 C) OR HIGHER *CHILLS OR SWEATING *NAUSEA AND VOMITING THAT IS NOT CONTROLLED WITH YOUR NAUSEA MEDICATION *UNUSUAL SHORTNESS OF BREATH *UNUSUAL BRUISING OR BLEEDING *URINARY PROBLEMS (pain or burning when urinating, or frequent urination) *BOWEL PROBLEMS (unusual diarrhea, constipation, pain near the anus) TENDERNESS IN MOUTH AND THROAT WITH OR WITHOUT PRESENCE OF ULCERS (sore throat, sores in mouth, or a toothache) UNUSUAL RASH, SWELLING OR PAIN  UNUSUAL VAGINAL DISCHARGE OR ITCHING   Items with * indicate a potential emergency and should be followed up as soon as possible or go to the Emergency Department if any problems should occur.  Please show the CHEMOTHERAPY ALERT CARD  or IMMUNOTHERAPY ALERT CARD at check-in to the Emergency Department and triage nurse.  Should you have questions after your visit or need to cancel or reschedule your appointment, please contact [facility] - A DEPT OF [facility]  Dept: [phone]  and follow the prompts.  Office hours are 8:00 a.m. to 4:30 p.m. Monday - Friday. Please note that voicemails left after 4:00 p.m. may not be returned until the following business day.  We are closed weekends and major holidays. You have access to a nurse at all times for urgent questions. Please call the main number to the clinic Dept: [phone] and follow the prompts.   For any non-urgent questions, you may also contact your provider using MyChart. We now offer e-Visits for anyone 31 and older to request care online for non-urgent symptoms. For details visit [email or web address].   Also download the MyChart app! Go to the app store, search "MyChart", open the app, select [place], and log in with your MyChart username and password.  Hypomagnesemia Hypomagnesemia is a condition in which the level of magnesium in the blood is too low. Magnesium is a mineral that is found in many foods. It is used in many different processes in the body. Hypomagnesemia can affect every organ in the body. In severe cases, it can cause life-threatening problems. What are the causes? This condition may be caused by: Not getting enough magnesium in your diet or not having enough healthy foods to eat (malnutrition). Problems with magnesium absorption in the intestines. Dehydration. Excessive use of  alcohol. Vomiting. Severe or long-term (chronic) diarrhea. Some medicines, including medicines that make you urinate more often (diuretics). Certain diseases, such as kidney disease, diabetes, celiac disease, and overactive thyroid. What are the signs or symptoms? Symptoms of this condition include: Loss of appetite, nausea, and  vomiting. Involuntary shaking or trembling of a body part (tremor). Muscle weakness or tingling in the arms and legs. Sudden tightening of muscles (muscle spasms). Confusion. Psychiatric issues, such as: Depression and irritability. Psychosis. A feeling of fluttering of the heart (palpitations). Seizures. These symptoms are more severe if magnesium levels drop suddenly. How is this diagnosed? This condition may be diagnosed based on: Your symptoms and medical history. A physical exam. Blood and urine tests. How is this treated? Treatment depends on the cause and the severity of the condition. It may be treated by: Taking a magnesium supplement. This can be taken in pill form. If the condition is severe, magnesium is usually given through an IV. Making changes to your diet. You may be directed to eat foods that have a lot of magnesium, such as green leafy vegetables, peas, beans, and nuts. Not drinking alcohol. If you are struggling not to drink, ask your health care provider for help. Follow these instructions at home: Eating and drinking     Make sure that your diet includes foods with magnesium. Foods that have a lot of magnesium in them include: Green leafy vegetables, such as spinach and broccoli. Beans and peas. Nuts and seeds, such as almonds and sunflower seeds. Whole grains, such as whole grain bread and fortified cereals. Drink fluids that contain salts and minerals (electrolytes), such as sports drinks, when you are active. Do not drink alcohol. General instructions Take over-the-counter and prescription medicines only as told by your health care provider. Take magnesium supplements as directed if your health care provider tells you to take them. Have your magnesium levels monitored as told by your health care provider. Keep all follow-up visits. This is important. Contact a health care provider if: You get worse instead of better. Your symptoms return. Get help  right away if: You develop severe muscle weakness. You have trouble breathing. You feel that your heart is racing. These symptoms may represent a serious problem that is an emergency. Do not wait to see if the symptoms will go away. Get medical help right away. Call your local emergency services (911 in the [place] Do not drive yourself to the hospital. Summary Hypomagnesemia is a condition in which the level of magnesium in the blood is too low. Hypomagnesemia can affect every organ in the body. Treatment may include eating more foods that contain magnesium, taking magnesium supplements, and not drinking alcohol. Have your magnesium levels monitored as told by your health care provider. This information is not intended to replace advice given to you by your health care provider. Make sure you discuss any questions you have with your health care provider. Document Revised: 12/17/2020 Document Reviewed: 12/17/2020 Elsevier Patient Education  2024 [employer].

## 2023-07-08 NOTE — Progress Notes (Addendum)
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 -we have changed his treatment to maintenance therapy with 5-fu and vectibix in late March 2024 -He is tolerating treatment very well, will continue. -due to metastasis in both lungs and retroperitoneal lymph nodes, he is not a candidate for surgery or local therapy  -restaging CT 04/23/2023 showed stable disease    Assessment and Plan    Metastatic Colon Cancer 57 year old with metastatic colon cancer, currently undergoing treatment. No new symptoms reported. Rash is minimal. Blood counts show mild, well-managed anemia. Kidney and liver function tests are normal. Emphasized the importance of continuing the current treatment regimen and regular follow-up. - Continue current treatment regimen - Administer next treatment on December 19 - Schedule CT scan on December 12 - Schedule follow-up appointments on January 2 and January 16 -  Ensure ferric sulfate 325 mg daily for anemia  Mild Anemia Mild anemia noted on blood tests, well-managed with iron supplements. - Ensure ferric sulfate 325 mg daily.     Plan -Lab reviewed, adequate for treatment, will proceed chemotherapy today and continue every 2 weeks -Follow-up in 2 weeks    SUMMARY OF ONCOLOGIC HISTORY: Oncology History Overview Note   Cancer Staging  [facility] Staging form: Colon and Rectum, AJCC 8th Edition - Pathologic stage from 08/08/2021: Stage IIIB (pT3, pN1a, cM0) - Signed by Malachy Mood, MD on 08/26/2021    Cancer of left colon (HCC)  04/01/2020 Imaging   IMPRESSION: 1. Gallbladder decompressed bowel also partially calcified gallstones. No pericholecystic inflammation though if there is persisting clinical concern for cholecystitis right upper quadrant ultrasound could be obtained. 2. Circumferential thickening of the distal thoracic esophagus. Could reflect features of esophagitis. Correlate with clinical symptoms and consider endoscopy as clinically warranted. 3. Additional segmental thickening of the mid to distal sigmoid with focal narrowing. No acute surrounding inflammation or resulting obstruction. Findings are nonspecific, and could reflect sequela of prior inflammation/infection. However, recommend correlation with colonoscopy if not recently performed. 4. Mild circumferential bladder wall thickening and indentation of the bladder base by an enlarged prostate. Possibly sequela of chronic outlet obstruction though could correlate with urinalysis to exclude cystitis. 5. Aortic Atherosclerosis (ICD10-I70.0).   08/06/2021 Imaging   IMPRESSION: Sigmoid colonic perforation with small free intraperitoneal gas and infiltration of the mesenteric and omental fat in keeping with changes of peritonitis.   Long segment inflammatory stranding of the sigmoid colon in keeping with a severe infectious or inflammatory colitis. This  terminates an area of irregular mural thickening, infiltrative soft tissue within  the colonic mesentery, and focal dystrophic calcification. This may represent a chronic inflammatory process, however, a perforated malignancy could appear similarly. There are 2 separate points of perforation which again raise the question of an underlying malignancy.   7.1 cm gas and fluid containing pericolonic abscess within the sigmoid mesentery.   Marked inflammatory change of the terminal ileum adjacent to the pericolonic abscess with resultant small bowel obstruction. Fluid within the distal esophagus likely relates to gastroesophageal reflux the setting of vomiting.   Aortic Atherosclerosis (ICD10-I70.0).   08/08/2021 Cancer Staging   Staging form: Colon and Rectum, AJCC 8th Edition - Pathologic stage from 08/08/2021: Stage IIIB (pT3, pN1a, cM0) - Signed by Malachy Mood, MD on 08/26/2021 Stage prefix: Initial diagnosis Total positive nodes: 1 Histologic grading system: 4 grade system Histologic grade (G): G2 Residual tumor (R): R0 - None   08/08/2021 Definitive Surgery   FINAL MICROSCOPIC DIAGNOSIS:   A. COLON, SIGMOID, PARTIAL COLECTOMY:  - Invasive moderately differentiated adenocarcinoma.  - Metastatic carcinoma involving one of twelve lymph nodes (1/12).  - See oncology table below.   ADDENDUM:  Mismatch Repair Protein (IHC)  SUMMARY INTERPRETATION: NORMAL    08/14/2021 Imaging   EXAM: CT ABDOMEN AND PELVIS WITH CONTRAST  IMPRESSION: 1. Post recent sigmoid colectomy with left lower quadrant colostomy. Two small residual foci of air within the pelvic mesentery with mild adjacent thickening, but no abscess or drainable collection. Trace non organized free fluid and stranding in the pelvis. 2. Short segment of small bowel wall thickening and inflammation in the pelvis involving the distal ileum, likely reactive. 3. Dilated distal esophagus, stomach, and small bowel, without discrete transition  point, favoring postoperative ileus. 4. Small bilateral pleural effusions and compressive atelectasis. 5. Heterogeneous partially enhancing 14 mm lymph node in the retroperitoneum at the aortoiliac bifurcation, not significantly changed from prior exam. Suspected additional lymph nodes in the anterior common iliac space, not significantly changed from prior exam. Recommend attention at follow-up. 6. Additional chronic findings as described.     08/15/2021 Imaging   EXAM: CT CHEST WITH CONTRAST  IMPRESSION: 1. No evidence of thoracic metastasis. 2. Bilateral small layering pleural effusions with passive atelectasis   08/26/2021 Initial Diagnosis   Cancer of left colon (HCC)   10/10/2021 - 12/12/2021 Chemotherapy   Patient is on Treatment Plan : COLORECTAL Xelox (Capeox) q21d     05/28/2022 - 05/28/2022 Chemotherapy   Patient is on Treatment Plan : COLORECTAL FOLFIRI + Bevacizumab q14d     05/28/2022 -  Chemotherapy   Patient is on Treatment Plan : COLORECTAL FOLFIRI + Panitumumab q14d     07/31/2022 Imaging    IMPRESSION: Decreased bilateral pulmonary metastases.   Stable mild abdominal lymphadenopathy.   No new or progressive metastatic disease within the chest, abdomen, or pelvis.   01/18/2023 Imaging    IMPRESSION: 1. Status post Gertie Gowda pouch sigmoid colon resection with left lower quadrant end colostomy and rectal stump. Unchanged, mild wall thickening of the decompressed distal colon at the ileostomy, as well as the rectal stump. 2. Unchanged small, treated metastatic pulmonary nodules. No new nodules. 3. Unchanged enlarged, coarsely calcified treated lower aortocaval metastatic lymph nodes. No new lymphadenopathy. 4. Coronary artery disease. 5. Cholelithiasis.   04/23/2023 Imaging   CT chest, abdomen, and pelvis with contrast  IMPRESSION: 1. Left lower lobe segmental pulmonary emboli, incidental finding. No evidence for right heart strain. 2. Stable trace  bilateral pleural effusions. 3. Stable enlarged lower left paraesophageal lymph node. 4. Stable  calcified retroperitoneal lymph nodes. No new enlarged lymph nodes in the chest, abdomen or pelvis. 5. Stable subcentimeter soft tissue nodules within the mesenteric fat along the inferior ostomy. 6. Left Bosniak I benign renal cyst measuring 4.6 cm. No follow-up imaging is recommended. JACR 2018 Feb; 264-273, Management of the Incidental Renal Mass on CT, [employer] 2021; 814-848, Bosniak Classification of Cystic Renal Masses, Version 2019.      Discussed the use of AI scribe software for clinical note transcription with the patient, who gave verbal consent to proceed.  History of Present Illness   A 57 year old patient with a history of metastatic colon cancer presents for a routine follow-up. The patient reports no new symptoms since the last treatment and denies any sickness following the last treatment. The patient has been tolerating the treatment well with minimal rash. The patient is also on Eliquis and Spiracinar for other health conditions. The patient's blood counts are stable with mild anemia, and kidney and liver functions are good. The patient is planning to move to [facility] in the near future and will inform the doctor when ready for a referral.         All other systems were reviewed with the patient and are negative.  MEDICAL HISTORY:  Past Medical History:  Diagnosis Date   Cancer (HCC)    [facility])     SURGICAL HISTORY: Past Surgical History:  Procedure Laterality Date   BRONCHIAL BIOPSY  05/11/2022   Procedure: BRONCHIAL BIOPSIES;  Surgeon: Leslye Peer, MD;  Location: [facility] ENDOSCOPY;  Service: Pulmonary;;   BRONCHIAL BRUSHINGS  05/11/2022   Procedure: BRONCHIAL BRUSHINGS;  Surgeon: Leslye Peer, MD;  Location: [facility] ENDOSCOPY;  Service: Pulmonary;;   BRONCHIAL NEEDLE ASPIRATION BIOPSY  05/11/2022   Procedure: BRONCHIAL NEEDLE ASPIRATION BIOPSIES;  Surgeon:  Leslye Peer, MD;  Location: [facility] ENDOSCOPY;  Service: Pulmonary;;   BRONCHIAL WASHINGS  05/11/2022   Procedure: BRONCHIAL WASHINGS;  Surgeon: Leslye Peer, MD;  Location: [facility] ENDOSCOPY;  Service: Pulmonary;;   IR IMAGING GUIDED PORT INSERTION  05/25/2022   LAPAROTOMY N/A 08/08/2021   Procedure: EXPLORATORY LAPAROTOMY;  Surgeon: Berna Bue, MD;  Location: [facility] OR;  Service: General;  Laterality: N/A;   PARTIAL COLECTOMY N/A 08/08/2021   Procedure: PARTIAL COLECTOMY WITH COLOSTOMY;  Surgeon: Berna Bue, MD;  Location: [facility] OR;  Service: General;  Laterality: N/A;   VIDEO BRONCHOSCOPY WITH RADIAL ENDOBRONCHIAL ULTRASOUND  05/11/2022   Procedure: VIDEO BRONCHOSCOPY WITH RADIAL ENDOBRONCHIAL ULTRASOUND;  Surgeon: Leslye Peer, MD;  Location: [facility] ENDOSCOPY;  Service: Pulmonary;;    I have reviewed the social history and family history with the patient and they are unchanged from previous note.  ALLERGIES:  has No Known Allergies.  MEDICATIONS:  Current Outpatient Medications  Medication Sig Dispense Refill   acetaminophen (TYLENOL) 500 MG tablet Take 2 tablets (1,000 mg total) by mouth every 6 (six) hours as needed for mild pain or moderate pain.  0   apixaban (ELIQUIS) 5 MG TABS tablet Take 1 tablet (5 mg total) by mouth 2 (two) times daily. 60 tablet 1   aspirin EC 81 MG tablet Take 81 mg by mouth daily as needed (for pain or headaches).      benztropine (COGENTIN) 1 MG tablet Take 1 mg at bedtime by mouth.     clindamycin (CLINDAGEL) 1 % gel Apply topically 2 (two) times daily. To skin rash on face and upper body 30 g 0   ferrous sulfate 325 (  65 FE) MG EC tablet Take 1 tablet (325 mg total) by mouth daily. 30 tablet 3   hydrocortisone cream 1 % Apply 1 Application topically 2 (two) times daily as needed for itching. For rash 30 g 2   loperamide (IMODIUM) 2 MG capsule Take 1 capsule (2 mg total) by mouth as needed for diarrhea or loose stools. 30 capsule 0   magnesium oxide (MAG-OX)  400 (240 Mg) MG tablet Take 1 tablet (400 mg total) by mouth daily. 30 tablet 2   Multiple Vitamin (MULTIVITAMIN WITH MINERALS) TABS tablet Take 1 tablet by mouth daily.     ondansetron (ZOFRAN) 8 MG tablet Take 1 tablet (8 mg total) by mouth every 8 (eight) hours as needed for nausea or vomiting. 30 tablet 0   paliperidone (INVEGA SUSTENNA) 156 MG/ML SUSP injection Inject 156 mg every 30 (thirty) days into the muscle.     polyethylene glycol (MIRALAX / GLYCOLAX) 17 g packet Take 17 g by mouth daily as needed for mild constipation or moderate constipation.  0   potassium chloride SA (KLOR-CON M) 20 MEQ tablet TAKE 1 TABLET(20 MEQ) BY MOUTH DAILY 30 tablet 2   prochlorperazine (COMPAZINE) 10 MG tablet TAKE 1 TABLET(10 MG) BY MOUTH EVERY 6 HOURS AS NEEDED FOR NAUSEA OR VOMITING 30 tablet 0   No current facility-administered medications for this visit.   Facility-Administered Medications Ordered in Other Visits  Medication Dose Route Frequency Provider Last Rate Last Admin   fluorouracil (ADRUCIL) 4,350 mg in sodium chloride 0.9 % 63 mL chemo infusion  2,400 mg/m2 (Treatment Plan Recorded) Intravenous 1 day or 1 dose Malachy Mood, MD   Infusion Verify at 07/08/23 1541   heparin lock flush 100 unit/mL  500 Units Intracatheter Once PRN Malachy Mood, MD       sodium chloride flush (NS) 0.9 % injection 10 mL  10 mL Intracatheter PRN Malachy Mood, MD   10 mL at 07/08/23 1528    PHYSICAL EXAMINATION: ECOG PERFORMANCE STATUS: 1 - Symptomatic but completely ambulatory  Vitals:   07/08/23 1146  BP: 120/82  Pulse: 92  Resp: 16  Temp: (!) 97.2 F (36.2 C)  SpO2: 100%   Wt Readings from Last 3 Encounters:  07/08/23 153 lb 14.4 oz (69.8 kg)  06/24/23 152 lb 1.6 oz (69 kg)  06/10/23 152 lb 1.6 oz (69 kg)     GENERAL:alert, no distress and comfortable SKIN: skin color, texture, turgor are normal, mild skin rash on face EYES: normal, Conjunctiva are pink and non-injected, sclera clear NECK: supple, thyroid  normal size, non-tender, without nodularity LYMPH:  no palpable lymphadenopathy in the cervical, axillary  LUNGS: clear to auscultation and percussion with normal breathing effort HEART: regular rate & rhythm and no murmurs and no lower extremity edema ABDOMEN:abdomen soft, non-tender and normal bowel sounds Musculoskeletal:no cyanosis of digits and no clubbing  NEURO: alert & oriented x 3 with fluent speech, no focal motor/sensory deficits    LABORATORY DATA:  I have reviewed the data as listed    Latest Ref Rng & Units 07/08/2023   10:56 AM 06/24/2023   10:36 AM 06/10/2023   11:24 AM  CBC  WBC 4.0 - 10.5 K/uL 4.6  6.0  3.8   Hemoglobin 13.0 - 17.0 g/dL 16.1  09.6  04.5   Hematocrit 39.0 - 52.0 % 33.6  35.5  34.3   Platelets 150 - 400 K/uL 367  371  372         Latest  Ref Rng & Units 07/08/2023   10:56 AM 06/24/2023   10:36 AM 06/10/2023   11:24 AM  CMP  Glucose 70 - 99 mg/dL 161  98  91   BUN 6 - 20 mg/dL 10  8  8    Creatinine 0.61 - 1.24 mg/dL 0.96  0.45  4.09   Sodium 135 - 145 mmol/L 137  140  139   Potassium 3.5 - 5.1 mmol/L 3.5  3.9  3.6   Chloride 98 - 111 mmol/L 107  107  107   CO2 22 - 32 mmol/L 25  26  25    Calcium 8.9 - 10.3 mg/dL 9.1  9.3  9.3   Total Protein 6.5 - 8.1 g/dL 6.4  6.8  6.7   Total Bilirubin <1.2 mg/dL 0.6  0.9  0.8   Alkaline Phos 38 - 126 U/L 103  120  111   AST 15 - 41 U/L 19  14  22    ALT 0 - 44 U/L 23  14  20        RADIOGRAPHIC STUDIES: I have personally reviewed the radiological images as listed and agreed with the findings in the report. No results found.    Orders Placed This Encounter  Procedures   Ferritin    Standing Status:   Standing    Number of Occurrences:   30    Standing Expiration Date:   07/07/2024   All questions were answered. The patient knows to call the clinic with any problems, questions or concerns. No barriers to learning was detected. The total time spent in the appointment was 25 minutes.     Malachy Mood,  MD 07/08/2023

## 2023-07-08 NOTE — Assessment & Plan Note (Signed)
stage IIIB p(T3, N1aM0), MSS, KRAS/NRAS/BRAF wild type, lung and node metastasis in 04/2022  -Initially diagnosed in 08/2021 -he completed 4 cycles adjuvant CAPOX 10/10/21 - 12/25/21 (though he somehow was still taking Xeloda through ~02/09/22 due to misunderstandings).  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 -we have changed his treatment to maintenance therapy with 5-fu and vectibix in late March 2024 -He is tolerating treatment very well, will continue. -due to metastasis in both lungs and retroperitoneal lymph nodes, he is not a candidate for surgery or local therapy  -restaging CT 04/23/2023 showed stable disease

## 2023-07-08 NOTE — Progress Notes (Signed)
Per Dr. Mosetta Putt, OK to treat today with Mg 1.4.  Pt will receive 2 g IV Mg today in addition to Tx.

## 2023-07-09 ENCOUNTER — Other Ambulatory Visit: Payer: Self-pay

## 2023-07-10 ENCOUNTER — Inpatient Hospital Stay: Payer: MEDICAID

## 2023-07-10 VITALS — BP 108/72 | HR 100 | Temp 97.9°F | Resp 17

## 2023-07-10 DIAGNOSIS — C186 Malignant neoplasm of descending colon: Secondary | ICD-10-CM

## 2023-07-10 DIAGNOSIS — Z5111 Encounter for antineoplastic chemotherapy: Secondary | ICD-10-CM | POA: Diagnosis not present

## 2023-07-10 MED ORDER — SODIUM CHLORIDE 0.9% FLUSH
10.0000 mL | INTRAVENOUS | Status: DC | PRN
  Administered 2023-07-10: 10 mL

## 2023-07-10 MED ORDER — HEPARIN SOD (PORK) LOCK FLUSH 100 UNIT/ML IV SOLN
500.0000 [IU] | Freq: Once | INTRAVENOUS | Status: AC | PRN
  Administered 2023-07-10: 500 [IU]

## 2023-07-15 ENCOUNTER — Ambulatory Visit (HOSPITAL_COMMUNITY)
Admission: RE | Admit: 2023-07-15 | Discharge: 2023-07-15 | Disposition: A | Discharge status: Home / Self Care | Payer: MEDICAID | Source: Ambulatory Visit | Attending: Hematology | Admitting: Hematology

## 2023-07-15 DIAGNOSIS — C186 Malignant neoplasm of descending colon: Secondary | ICD-10-CM | POA: Diagnosis present

## 2023-07-15 MED ORDER — IOHEXOL 300 MG/ML  SOLN
30.0000 mL | Freq: Once | INTRAMUSCULAR | Status: AC | PRN
Start: 2023-07-15 — End: 2023-07-15
  Administered 2023-07-15: 30 mL via ORAL

## 2023-07-15 MED ORDER — IOHEXOL 300 MG/ML  SOLN
100.0000 mL | Freq: Once | INTRAMUSCULAR | Status: AC | PRN
  Administered 2023-07-15: 100 mL via INTRAVENOUS

## 2023-07-22 ENCOUNTER — Encounter: Payer: Self-pay | Admitting: Nurse Practitioner

## 2023-07-22 ENCOUNTER — Inpatient Hospital Stay: Payer: MEDICAID

## 2023-07-22 ENCOUNTER — Encounter: Payer: Self-pay | Admitting: Hematology

## 2023-07-22 ENCOUNTER — Inpatient Hospital Stay: Payer: MEDICAID | Admitting: Nurse Practitioner

## 2023-07-22 VITALS — BP 94/60 | HR 112 | Temp 97.1°F | Resp 17 | Wt 153.6 lb

## 2023-07-22 VITALS — BP 127/83 | HR 94 | Temp 98.0°F | Resp 16

## 2023-07-22 DIAGNOSIS — C186 Malignant neoplasm of descending colon: Secondary | ICD-10-CM

## 2023-07-22 DIAGNOSIS — E860 Dehydration: Secondary | ICD-10-CM

## 2023-07-22 DIAGNOSIS — Z95828 Presence of other vascular implants and grafts: Secondary | ICD-10-CM

## 2023-07-22 DIAGNOSIS — C189 Malignant neoplasm of colon, unspecified: Secondary | ICD-10-CM

## 2023-07-22 DIAGNOSIS — Z5111 Encounter for antineoplastic chemotherapy: Secondary | ICD-10-CM | POA: Diagnosis not present

## 2023-07-22 DIAGNOSIS — D500 Iron deficiency anemia secondary to blood loss (chronic): Secondary | ICD-10-CM

## 2023-07-22 LAB — CBC WITH DIFFERENTIAL (CANCER CENTER ONLY)
Abs Immature Granulocytes: 0 10*3/uL (ref 0.00–0.07)
Basophils Absolute: 0 10*3/uL (ref 0.0–0.1)
Basophils Relative: 1 %
Eosinophils Absolute: 0.1 10*3/uL (ref 0.0–0.5)
Eosinophils Relative: 2 %
HCT: 35.2 % — ABNORMAL LOW (ref 39.0–52.0)
Hemoglobin: 11.2 g/dL — ABNORMAL LOW (ref 13.0–17.0)
Immature Granulocytes: 0 %
Lymphocytes Relative: 23 %
Lymphs Abs: 0.8 10*3/uL (ref 0.7–4.0)
MCH: 24.7 pg — ABNORMAL LOW (ref 26.0–34.0)
MCHC: 31.8 g/dL (ref 30.0–36.0)
MCV: 77.7 fL — ABNORMAL LOW (ref 80.0–100.0)
Monocytes Absolute: 0.2 10*3/uL (ref 0.1–1.0)
Monocytes Relative: 7 %
Neutro Abs: 2.2 10*3/uL (ref 1.7–7.7)
Neutrophils Relative %: 67 %
Platelet Count: 364 10*3/uL (ref 150–400)
RBC: 4.53 MIL/uL (ref 4.22–5.81)
RDW: 21.4 % — ABNORMAL HIGH (ref 11.5–15.5)
WBC Count: 3.3 10*3/uL — ABNORMAL LOW (ref 4.0–10.5)
nRBC: 0 % (ref 0.0–0.2)

## 2023-07-22 LAB — CMP (CANCER CENTER ONLY)
ALT: 11 U/L (ref 0–44)
AST: 14 U/L — ABNORMAL LOW (ref 15–41)
Albumin: 3.9 g/dL (ref 3.5–5.0)
Alkaline Phosphatase: 96 U/L (ref 38–126)
Anion gap: 11 (ref 5–15)
BUN: 8 mg/dL (ref 6–20)
CO2: 23 mmol/L (ref 22–32)
Calcium: 9.1 mg/dL (ref 8.9–10.3)
Chloride: 106 mmol/L (ref 98–111)
Creatinine: 0.76 mg/dL (ref 0.61–1.24)
GFR, Estimated: >60 mL/min (ref >60)
Glucose, Bld: 149 mg/dL — ABNORMAL HIGH (ref 70–99)
Potassium: 3.4 mmol/L — ABNORMAL LOW (ref 3.5–5.1)
Sodium: 140 mmol/L (ref 135–145)
Total Bilirubin: 0.8 mg/dL (ref <1.2)
Total Protein: 6.4 g/dL — ABNORMAL LOW (ref 6.5–8.1)

## 2023-07-22 LAB — FERRITIN: Ferritin: 8 ng/mL — ABNORMAL LOW (ref 24–336)

## 2023-07-22 LAB — MAGNESIUM: Magnesium: 1.2 mg/dL — ABNORMAL LOW (ref 1.7–2.4)

## 2023-07-22 MED ORDER — DEXAMETHASONE SODIUM PHOSPHATE 10 MG/ML IJ SOLN
10.0000 mg | Freq: Once | INTRAMUSCULAR | Status: AC
  Administered 2023-07-22: 10 mg via INTRAVENOUS
  Filled 2023-07-22: qty 1

## 2023-07-22 MED ORDER — MAGNESIUM SULFATE 2 GM/50ML IV SOLN
2.0000 g | Freq: Once | INTRAVENOUS | Status: AC
Start: 2023-07-22 — End: 2023-07-22
  Administered 2023-07-22: 2 g via INTRAVENOUS
  Filled 2023-07-22: qty 50

## 2023-07-22 MED ORDER — POTASSIUM CHLORIDE CRYS ER 20 MEQ PO TBCR: 20.0000 meq | EXTENDED_RELEASE_TABLET | Freq: Every day | ORAL | 2 refills | Status: DC

## 2023-07-22 MED ORDER — MAGNESIUM OXIDE -MG SUPPLEMENT 400 (240 MG) MG PO TABS: 400.0000 mg | ORAL_TABLET | Freq: Every day | ORAL | 2 refills | Status: DC

## 2023-07-22 MED ORDER — SODIUM CHLORIDE 0.9 % IV SOLN: Freq: Once | INTRAVENOUS | Status: DC

## 2023-07-22 MED ORDER — SODIUM CHLORIDE 0.9% FLUSH
10.0000 mL | INTRAVENOUS | Status: DC | PRN
Start: 2023-07-22 — End: 2023-07-22

## 2023-07-22 MED ORDER — HEPARIN SOD (PORK) LOCK FLUSH 100 UNIT/ML IV SOLN: 500.0000 [IU] | Freq: Once | INTRAVENOUS | Status: DC | PRN

## 2023-07-22 MED ORDER — SODIUM CHLORIDE 0.9 % IV SOLN: INTRAVENOUS | Status: DC

## 2023-07-22 MED ORDER — PALONOSETRON HCL INJECTION 0.25 MG/5ML
0.2500 mg | Freq: Once | INTRAVENOUS | Status: AC
  Administered 2023-07-22: 0.25 mg via INTRAVENOUS
  Filled 2023-07-22: qty 5

## 2023-07-22 MED ORDER — SODIUM CHLORIDE 0.9 % IV SOLN
400.0000 mg/m2 | Freq: Once | INTRAVENOUS | Status: AC
  Administered 2023-07-22: 728 mg via INTRAVENOUS
  Filled 2023-07-22: qty 25

## 2023-07-22 MED ORDER — SODIUM CHLORIDE 0.9 % IV SOLN
2400.0000 mg/m2 | INTRAVENOUS | Status: DC
  Administered 2023-07-22: 4350 mg via INTRAVENOUS
  Filled 2023-07-22: qty 87

## 2023-07-22 MED ORDER — PANITUMUMAB CHEMO INJECTION 100 MG/5ML
6.0000 mg/kg | Freq: Once | INTRAVENOUS | Status: AC
  Administered 2023-07-22: 400 mg via INTRAVENOUS
  Filled 2023-07-22: qty 20

## 2023-07-22 MED ORDER — SODIUM CHLORIDE 0.9% FLUSH
10.0000 mL | Freq: Once | INTRAVENOUS | Status: AC
  Administered 2023-07-22: 10 mL

## 2023-07-22 NOTE — Patient Instructions (Signed)
[facility] - A DEPT OF [facility]  Discharge Instructions: Thank you for choosing [facility] to provide your oncology and hematology care.   If you have a lab appointment with the Cancer Center, please go directly to the Cancer Center and check in at the registration area.   Wear comfortable clothing and clothing appropriate for easy access to any Portacath or PICC line.   We strive to give you quality time with your provider. You may need to reschedule your appointment if you arrive late (15 or more minutes).  Arriving late affects you and other patients whose appointments are after yours.  Also, if you miss three or more appointments without notifying the office, you may be dismissed from the clinic at the provider's discretion.      For prescription refill requests, have your pharmacy contact our office and allow 72 hours for refills to be completed.    Today you received the following chemotherapy and/or immunotherapy agents: Vectibix, Leucovorin, Fluorouracil      To help prevent nausea and vomiting after your treatment, we encourage you to take your nausea medication as directed.  BELOW ARE SYMPTOMS THAT SHOULD BE REPORTED IMMEDIATELY: *FEVER GREATER THAN 100.4 F (38 C) OR HIGHER *CHILLS OR SWEATING *NAUSEA AND VOMITING THAT IS NOT CONTROLLED WITH YOUR NAUSEA MEDICATION *UNUSUAL SHORTNESS OF BREATH *UNUSUAL BRUISING OR BLEEDING *URINARY PROBLEMS (pain or burning when urinating, or frequent urination) *BOWEL PROBLEMS (unusual diarrhea, constipation, pain near the anus) TENDERNESS IN MOUTH AND THROAT WITH OR WITHOUT PRESENCE OF ULCERS (sore throat, sores in mouth, or a toothache) UNUSUAL RASH, SWELLING OR PAIN  UNUSUAL VAGINAL DISCHARGE OR ITCHING   Items with * indicate a potential emergency and should be followed up as soon as possible or go to the Emergency Department if any problems should occur.  Please show the CHEMOTHERAPY  ALERT CARD or IMMUNOTHERAPY ALERT CARD at check-in to the Emergency Department and triage nurse.  Should you have questions after your visit or need to cancel or reschedule your appointment, please contact [facility] - A DEPT OF [facility]  Dept: [phone]  and follow the prompts.  Office hours are 8:00 a.m. to 4:30 p.m. Monday - Friday. Please note that voicemails left after 4:00 p.m. may not be returned until the following business day.  We are closed weekends and major holidays. You have access to a nurse at all times for urgent questions. Please call the main number to the clinic Dept: [phone] and follow the prompts.   For any non-urgent questions, you may also contact your provider using MyChart. We now offer e-Visits for anyone 17 and older to request care online for non-urgent symptoms. For details visit [email or web address].   Also download the MyChart app! Go to the app store, search "MyChart", open the app, select [place], and log in with your MyChart username and password.

## 2023-07-22 NOTE — Progress Notes (Signed)
Patient Care Team: Medicine, [facility] as PCP - General Berna Bue, MD as Consulting Physician (General Surgery) Malachy Mood, MD as Consulting Physician (Oncology)   CHIEF COMPLAINT: Follow-up colon cancer  Oncology History Overview Note   Cancer Staging  Cancer of left colon [facility]) Staging form: Colon and Rectum, AJCC 8th Edition - Pathologic stage from 08/08/2021: Stage IIIB (pT3, pN1a, cM0) - Signed by Malachy Mood, MD on 08/26/2021    Cancer of left colon (HCC)  04/01/2020 Imaging   IMPRESSION: 1. Gallbladder decompressed bowel also partially calcified gallstones. No pericholecystic inflammation though if there is persisting clinical concern for cholecystitis right upper quadrant ultrasound could be obtained. 2. Circumferential thickening of the distal thoracic esophagus. Could reflect features of esophagitis. Correlate with clinical symptoms and consider endoscopy as clinically warranted. 3. Additional segmental thickening of the mid to distal sigmoid with focal narrowing. No acute surrounding inflammation or resulting obstruction. Findings are nonspecific, and could reflect sequela of prior inflammation/infection. However, recommend correlation with colonoscopy if not recently performed. 4. Mild circumferential bladder wall thickening and indentation of the bladder base by an enlarged prostate. Possibly sequela of chronic outlet obstruction though could correlate with urinalysis to exclude cystitis. 5. Aortic Atherosclerosis (ICD10-I70.0).   08/06/2021 Imaging   IMPRESSION: Sigmoid colonic perforation with small free intraperitoneal gas and infiltration of the mesenteric and omental fat in keeping with changes of peritonitis.   Long segment inflammatory stranding of the sigmoid colon in keeping with a severe infectious or inflammatory colitis. This terminates an area of irregular mural thickening, infiltrative soft tissue within the colonic mesentery,  and focal dystrophic calcification. This may represent a chronic inflammatory process, however, a perforated malignancy could appear similarly. There are 2 separate points of perforation which again raise the question of an underlying malignancy.   7.1 cm gas and fluid containing pericolonic abscess within the sigmoid mesentery.   Marked inflammatory change of the terminal ileum adjacent to the pericolonic abscess with resultant small bowel obstruction. Fluid within the distal esophagus likely relates to gastroesophageal reflux the setting of vomiting.   Aortic Atherosclerosis (ICD10-I70.0).   08/08/2021 Cancer Staging   Staging form: Colon and Rectum, AJCC 8th Edition - Pathologic stage from 08/08/2021: Stage IIIB (pT3, pN1a, cM0) - Signed by Malachy Mood, MD on 08/26/2021 Stage prefix: Initial diagnosis Total positive nodes: 1 Histologic grading system: 4 grade system Histologic grade (G): G2 Residual tumor (R): R0 - None   08/08/2021 Definitive Surgery   FINAL MICROSCOPIC DIAGNOSIS:   A. COLON, SIGMOID, PARTIAL COLECTOMY:  - Invasive moderately differentiated adenocarcinoma.  - Metastatic carcinoma involving one of twelve lymph nodes (1/12).  - See oncology table below.   ADDENDUM:  Mismatch Repair Protein (IHC)  SUMMARY INTERPRETATION: NORMAL    08/14/2021 Imaging   EXAM: CT ABDOMEN AND PELVIS WITH CONTRAST  IMPRESSION: 1. Post recent sigmoid colectomy with left lower quadrant colostomy. Two small residual foci of air within the pelvic mesentery with mild adjacent thickening, but no abscess or drainable collection. Trace non organized free fluid and stranding in the pelvis. 2. Short segment of small bowel wall thickening and inflammation in the pelvis involving the distal ileum, likely reactive. 3. Dilated distal esophagus, stomach, and small bowel, without discrete transition point, favoring postoperative ileus. 4. Small bilateral pleural effusions and compressive atelectasis. 5.  Heterogeneous partially enhancing 14 mm lymph node in the retroperitoneum at the aortoiliac bifurcation, not significantly changed from prior exam. Suspected additional lymph nodes in the  anterior common iliac space, not significantly changed from prior exam. Recommend attention at follow-up. 6. Additional chronic findings as described.     08/15/2021 Imaging   EXAM: CT CHEST WITH CONTRAST  IMPRESSION: 1. No evidence of thoracic metastasis. 2. Bilateral small layering pleural effusions with passive atelectasis   08/26/2021 Initial Diagnosis   Cancer of left colon (HCC)   10/10/2021 - 12/12/2021 Chemotherapy   Patient is on Treatment Plan : COLORECTAL Xelox (Capeox) q21d     05/28/2022 - 05/28/2022 Chemotherapy   Patient is on Treatment Plan : COLORECTAL FOLFIRI + Bevacizumab q14d     05/28/2022 -  Chemotherapy   Patient is on Treatment Plan : COLORECTAL FOLFIRI + Panitumumab q14d     07/31/2022 Imaging    IMPRESSION: Decreased bilateral pulmonary metastases.   Stable mild abdominal lymphadenopathy.   No new or progressive metastatic disease within the chest, abdomen, or pelvis.   01/18/2023 Imaging    IMPRESSION: 1. Status post Gertie Gowda pouch sigmoid colon resection with left lower quadrant end colostomy and rectal stump. Unchanged, mild wall thickening of the decompressed distal colon at the ileostomy, as well as the rectal stump. 2. Unchanged small, treated metastatic pulmonary nodules. No new nodules. 3. Unchanged enlarged, coarsely calcified treated lower aortocaval metastatic lymph nodes. No new lymphadenopathy. 4. Coronary artery disease. 5. Cholelithiasis.   04/23/2023 Imaging   CT chest, abdomen, and pelvis with contrast  IMPRESSION: 1. Left lower lobe segmental pulmonary emboli, incidental finding. No evidence for right heart strain. 2. Stable trace bilateral pleural effusions. 3. Stable enlarged lower left paraesophageal lymph node. 4. Stable calcified  retroperitoneal lymph nodes. No new enlarged lymph nodes in the chest, abdomen or pelvis. 5. Stable subcentimeter soft tissue nodules within the mesenteric fat along the inferior ostomy. 6. Left Bosniak I benign renal cyst measuring 4.6 cm. No follow-up imaging is recommended. JACR 2018 Feb; 264-273, Management of the Incidental Renal Mass on CT, [employer] 2021; 814-848, Bosniak Classification of Cystic Renal Masses, Version 2019.      CURRENT THERAPY: Second line maintenance chemotherapy 5-fu and Vectibix   INTERVAL HISTORY Mr. Polmanteer returns for follow-up and treatment as scheduled, last seen by Dr. Mosetta Putt 07/08/2023 with another cycle of maintenance 5-FU/Vectibix.  He feels well significant changes.  Denies rash or neuropathy.  He has occasional increased salivation/nausea and vomiting.  He remains able to eat and drink.  Denies diarrhea, pain.  He drinks 2 drinks per day.  Denies dizziness on standing. He underwent a restaging scan.  ROS  All other systems reviewed and negative Past Medical History:  Diagnosis Date   Cancer (HCC)    Schizophrenia [facility])      Past Surgical History:  Procedure Laterality Date   BRONCHIAL BIOPSY  05/11/2022   Procedure: BRONCHIAL BIOPSIES;  Surgeon: Leslye Peer, MD;  Location: [facility] ENDOSCOPY;  Service: Pulmonary;;   BRONCHIAL BRUSHINGS  05/11/2022   Procedure: BRONCHIAL BRUSHINGS;  Surgeon: Leslye Peer, MD;  Location: [facility] ENDOSCOPY;  Service: Pulmonary;;   BRONCHIAL NEEDLE ASPIRATION BIOPSY  05/11/2022   Procedure: BRONCHIAL NEEDLE ASPIRATION BIOPSIES;  Surgeon: Leslye Peer, MD;  Location: [facility] ENDOSCOPY;  Service: Pulmonary;;   BRONCHIAL WASHINGS  05/11/2022   Procedure: BRONCHIAL WASHINGS;  Surgeon: Leslye Peer, MD;  Location: [facility] ENDOSCOPY;  Service: Pulmonary;;   IR IMAGING GUIDED PORT INSERTION  05/25/2022   LAPAROTOMY N/A 08/08/2021   Procedure: EXPLORATORY LAPAROTOMY;  Surgeon: Berna Bue, MD;  Location: [facility] OR;  Service: General;  Laterality: N/A;   PARTIAL COLECTOMY N/A 08/08/2021   Procedure: PARTIAL COLECTOMY WITH COLOSTOMY;  Surgeon: Berna Bue, MD;  Location: [facility] OR;  Service: General;  Laterality: N/A;   VIDEO BRONCHOSCOPY WITH RADIAL ENDOBRONCHIAL ULTRASOUND  05/11/2022   Procedure: VIDEO BRONCHOSCOPY WITH RADIAL ENDOBRONCHIAL ULTRASOUND;  Surgeon: Leslye Peer, MD;  Location: [facility] ENDOSCOPY;  Service: Pulmonary;;     Outpatient Encounter Medications as of 07/22/2023  Medication Sig Note   acetaminophen (TYLENOL) 500 MG tablet Take 2 tablets (1,000 mg total) by mouth every 6 (six) hours as needed for mild pain or moderate pain.    apixaban (ELIQUIS) 5 MG TABS tablet Take 1 tablet (5 mg total) by mouth 2 (two) times daily.    aspirin EC 81 MG tablet Take 81 mg by mouth daily as needed (for pain or headaches).     benztropine (COGENTIN) 1 MG tablet Take 1 mg at bedtime by mouth.    clindamycin (CLINDAGEL) 1 % gel Apply topically 2 (two) times daily. To skin rash on face and upper body    ferrous sulfate 325 (65 FE) MG EC tablet Take 1 tablet (325 mg total) by mouth daily.    hydrocortisone cream 1 % Apply 1 Application topically 2 (two) times daily as needed for itching. For rash    loperamide (IMODIUM) 2 MG capsule Take 1 capsule (2 mg total) by mouth as needed for diarrhea or loose stools.    Multiple Vitamin (MULTIVITAMIN WITH MINERALS) TABS tablet Take 1 tablet by mouth daily.    ondansetron (ZOFRAN) 8 MG tablet Take 1 tablet (8 mg total) by mouth every 8 (eight) hours as needed for nausea or vomiting.    paliperidone (INVEGA SUSTENNA) 156 MG/ML SUSP injection Inject 156 mg every 30 (thirty) days into the muscle. 05/25/2022: Due tomorrow   polyethylene glycol (MIRALAX / GLYCOLAX) 17 g packet Take 17 g by mouth daily as needed for mild constipation or moderate constipation.    prochlorperazine (COMPAZINE) 10 MG tablet TAKE 1 TABLET(10 MG) BY MOUTH EVERY 6 HOURS AS NEEDED FOR NAUSEA OR VOMITING     [DISCONTINUED] magnesium oxide (MAG-OX) 400 (240 Mg) MG tablet Take 1 tablet (400 mg total) by mouth daily.    [DISCONTINUED] potassium chloride SA (KLOR-CON M) 20 MEQ tablet TAKE 1 TABLET(20 MEQ) BY MOUTH DAILY    magnesium oxide (MAG-OX) 400 (240 Mg) MG tablet Take 1 tablet (400 mg total) by mouth daily.    potassium chloride SA (KLOR-CON M) 20 MEQ tablet Take 1 tablet (20 mEq total) by mouth daily.    No facility-administered encounter medications on file as of 07/22/2023.     Today's Vitals   07/22/23 0914 07/22/23 0916 07/22/23 0924  BP: 95/66 94/60   Pulse: (!) 112    Resp: 17    Temp: (!) 97.1 F (36.2 C)    TempSrc: Temporal    SpO2: 100%    Weight: 153 lb 9.6 oz (69.7 kg)    PainSc:   0-No pain   Body mass index is 22.68 kg/m.   PHYSICAL EXAM GENERAL:alert, no distress and comfortable SKIN: no rash  EYES: sclera clear NECK: without mass LYMPH:  no palpable cervical or supraclavicular lymphadenopathy  LUNGS: clear with normal breathing effort HEART: regular rate & rhythm, no lower extremity edema ABDOMEN: abdomen soft, non-tender and normal bowel sounds.  Colostomy noted NEURO: alert & oriented x 3 with fluent speech, no focal motor/sensory deficits PAC without erythema    CBC  Component Value Date/Time   WBC 3.3 (L) 07/22/2023 0853   WBC 5.2 05/11/2022 0801   RBC 4.53 07/22/2023 0853   HGB 11.2 (L) 07/22/2023 0853   HCT 35.2 (L) 07/22/2023 0853   PLT 364 07/22/2023 0853   MCV 77.7 (L) 07/22/2023 0853   MCH 24.7 (L) 07/22/2023 0853   MCHC 31.8 07/22/2023 0853   RDW 21.4 (H) 07/22/2023 0853   LYMPHSABS 0.8 07/22/2023 0853   MONOABS 0.2 07/22/2023 0853   EOSABS 0.1 07/22/2023 0853   BASOSABS 0.0 07/22/2023 0853     CMP     Component Value Date/Time   NA 137 07/08/2023 1056   K 3.5 07/08/2023 1056   CL 107 07/08/2023 1056   CO2 25 07/08/2023 1056   GLUCOSE 109 (H) 07/08/2023 1056   BUN 10 07/08/2023 1056   CREATININE 0.68 07/08/2023 1056    CALCIUM 9.1 07/08/2023 1056   PROT 6.4 (L) 07/08/2023 1056   ALBUMIN 3.9 07/08/2023 1056   AST 19 07/08/2023 1056   ALT 23 07/08/2023 1056   ALKPHOS 103 07/08/2023 1056   BILITOT 0.6 07/08/2023 1056   GFRNONAA >60 07/08/2023 1056   GFRAA >60 04/01/2020 1237     ASSESSMENT & PLAN:Matthew [name] is a 57 y.o. male with    1. Cancer of left colon, stage IIIB p(T3, N1aM0), MSS -presented to ED on 08/06/21 with sigmoid colon perforation, s/p emergent partial colectomy on 08/08/21 by Dr. Doylene Canard showed invasive moderately differentiated adenocarcinoma. Margins negative, one lymph node showed metastatic carcinoma (1/12). -Staging chest CT 08/15/21 was negative for metastatic disease. -he completed 4 cycles adjuvant CAPOX on 10/10/21 - May or July 2023 -Surveillance scan 01/2022 showed pulmonary nodules and retroperitoneal adenopathy, concerning for recurrent/metastatic disease -Lung biopsy confirmed malignant cells consistent with colonic primary -He began first-line FOLFIRI in 05/2022, Vectibix added with cycle 2.  Tolerated well with good response on CT from 07/31/2022 and again with continued response in lungs and lymph nodes on CT CAP from 10/26/2022.   -Changed to maintenance 5FU/leuc and vectibix q2 weeks, on 10/28/22 -Restaging CT 04/23/2023 showed stable disease -Mr. Smither appears stable.  He continues maintenance 5-FU/leuc and panitumumab q2 weeks, tolerating well with mild nausea/vomiting.  We reviewed symptom management.  Weight is stable. -I reviewed his restaging CT CAP which is overall stable, no new sites of disease.  Will continue the current regimen. - Labs reviewed, adequate to proceed with another cycle of maintenance 5-FU/leuc and panitumumab today as scheduled, no dose adjustments.  Will give 1 L NS for mild dehydration -Follow-up and next cycle in 2 weeks   2.  Pulmonary emboli -Incidental finding on restaging CT 04/23/2023.  Asymptomatic -On Eliquis, tolerating well   3. Anemia,  likely iron deficient -chronic prior to diagnosis, worsened with surgery -he is currently on oral iron. -overall mild and stable    4. Schizophrenia, disabled, social support -he was diagnosed with schizophrenia in his 20's, managed with medication, he is on disability. -he lives with his mother; he is able to do ADLs for himself, but not iADLS. They are both on disability.    PLAN: -Staging CT CAP reviewed, overall stable. Continue current regimen -Labs reviewed, proceed with maintenance 5-FU/leuc and panitumumab -1 L NS over 2 hours with treatment  -Reviewed med list and symptom management with pt and his mother -Refilled oral mag and potassium, take each once daily -Follow-up and next cycle in 2 weeks   All questions were answered. The patient knows to call  the clinic with any problems, questions or concerns. No barriers to learning were detected. I spent 20 minutes counseling the patient face to face. The total time spent in the appointment was 30 minutes and more than 50% was on counseling, review of test results, and coordination of care.    Santiago Glad, NP-C 07/22/2023

## 2023-07-22 NOTE — Progress Notes (Signed)
Per Lacie PA, ok to treat with abnormal BP and pulse. Pt receiving fluids and Magnesium with tx today.

## 2023-07-24 ENCOUNTER — Inpatient Hospital Stay: Payer: MEDICAID

## 2023-07-24 VITALS — BP 121/76 | HR 127 | Temp 97.9°F | Resp 18

## 2023-07-24 DIAGNOSIS — Z5111 Encounter for antineoplastic chemotherapy: Secondary | ICD-10-CM | POA: Diagnosis not present

## 2023-07-24 DIAGNOSIS — C186 Malignant neoplasm of descending colon: Secondary | ICD-10-CM

## 2023-07-24 MED ORDER — SODIUM CHLORIDE 0.9% FLUSH
10.0000 mL | INTRAVENOUS | Status: DC | PRN
Start: 2023-07-24 — End: 2023-07-24
  Administered 2023-07-24: 10 mL

## 2023-07-24 MED ORDER — HEPARIN SOD (PORK) LOCK FLUSH 100 UNIT/ML IV SOLN
500.0000 [IU] | Freq: Once | INTRAVENOUS | Status: AC | PRN
  Administered 2023-07-24: 500 [IU]

## 2023-07-29 ENCOUNTER — Telehealth: Payer: Self-pay

## 2023-07-29 NOTE — Telephone Encounter (Addendum)
Tried to contact patient by phone we were unable to reach anyone at any of the contact numbers and was unable to leave a message due to mailbox being full.  ----- Message from Pollyann Samples sent at 07/26/2023  8:38 AM EST ----- Please call pt/mother, I recommend to restart oral iron, will likely need to review med list/bottles with pt's mother on the phone.  Thanks Lacie NP

## 2023-07-29 NOTE — Progress Notes (Signed)
Attempted to contact patient. No answer at this time. 

## 2023-07-30 ENCOUNTER — Telehealth: Payer: Self-pay

## 2023-07-30 ENCOUNTER — Other Ambulatory Visit: Payer: Self-pay

## 2023-07-30 MED ORDER — FERROUS SULFATE 325 (65 FE) MG PO TBEC: 325.0000 mg | DELAYED_RELEASE_TABLET | Freq: Every day | ORAL | 3 refills | Status: AC

## 2023-07-30 NOTE — Telephone Encounter (Addendum)
Called patients mother to relay message below as per Santiago Glad NP. I went over the medication that was needed for him to take. She voiced understanding.  ----- Message from Pollyann Samples sent at 07/26/2023  8:38 AM EST ----- Please call pt/mother, I recommend to restart oral iron, will likely need to review med list/bottles with pt's mother on the phone.  Thanks Lacie NP

## 2023-08-04 NOTE — Assessment & Plan Note (Addendum)
 stage IIIB p(T3, N1aM0), MSS -presented to ED on 08/06/21 with sigmoid colon perforation, s/p emergent partial colectomy on 08/08/21 by Dr. Cameron showed invasive moderately differentiated adenocarcinoma. Margins negative, one lymph node showed metastatic carcinoma (1/12). -Staging chest CT 08/15/21 was negative for metastatic disease. -he completed 4 cycles adjuvant CAPOX on 10/10/21 - May or July 2023 -Surveillance scan 01/2022 showed pulmonary nodules and retroperitoneal adenopathy, concerning for recurrent/metastatic disease -Lung biopsy confirmed malignant cells consistent with colonic primary -He began first-line FOLFIRI in 05/2022, Vectibix  added with cycle 2.  Tolerated well with good response on CT from 07/31/2022 and again with continued response in lungs and lymph nodes on CT CAP from 10/26/2022.   -Changed to maintenance 5FU/leuc and vectibix  q2 weeks, on 10/28/22 -Restaging CT 04/23/2023 showed stable disease -Mr. Stage appears stable.  He continues maintenance 5-FU/leuc and panitumumab  q2 weeks, tolerating well with mild nausea/vomiting.  We reviewed symptom management.  Weight is stable. -I reviewed his restaging CT CAP which is overall stable, no new sites of disease.  Will continue the current regimen. - Labs reviewed, adequate to proceed with another cycle of maintenance 5-FU/leuc and panitumumab  today as scheduled, no dose adjustments.  Will give 1 L NS for mild dehydration -Follow-up and next cycle in 2 weeks 07/15/2023 - CT CAP showed prior partial left hemicolectomy with end colostomy in the left anterior abdominal wall. Unchanged mild wall thickening of the decompressed distal colon at the ileostomy as well as the rectal stump/Hartmann's pouch. Stable enlarged left lower paraesophageal lymph node. Increased size of the treated pulmonary nodule in the lingula now measuring 6 mm previously 4 mm with increased in size favored artifactual given motion degrading examination of the lung bases.  Unchanged size of the treated pulmonary nodule in the right middle lobe measuring 4 mm. Increased size of prominent bilateral axillary lymph nodes measuring up to 7 mm in short axis, nonspecific. No convincing evidence of new or progressive disease in the chest, abdomen or pelvis. -Today is cycle 31 day 1 -proceed as scheduled.

## 2023-08-04 NOTE — Progress Notes (Signed)
 Patient Care Team: Medicine, [facility] as PCP - General [name], MD as Consulting Physician (General Surgery) [name], MD as Consulting Physician (Oncology)  Clinic Day:  08/08/2023  Referring physician: [name], MD  ASSESSMENT & PLAN:   Assessment & Plan: Cancer of left colon (HCC) stage IIIB p(T3, N1aM0), MSS -presented to ED on 08/06/21 with sigmoid colon perforation, s/p emergent partial colectomy on 08/08/21 by Dr. Cameron showed invasive moderately differentiated adenocarcinoma. Margins negative, one lymph node showed metastatic carcinoma (1/12). -Staging chest CT 08/15/21 was negative for metastatic disease. -he completed 4 cycles adjuvant CAPOX on 10/10/21 - May or July 2023 -Surveillance scan 01/2022 showed pulmonary nodules and retroperitoneal adenopathy, concerning for recurrent/metastatic disease -Lung biopsy confirmed malignant cells consistent with colonic primary -He began first-line FOLFIRI in 05/2022, Vectibix  added with cycle 2.  Tolerated well with good response on CT from 07/31/2022 and again with continued response in lungs and lymph nodes on CT CAP from 10/26/2022.   -Changed to maintenance 5FU/leuc and vectibix  q2 weeks, on 10/28/22 -Restaging CT 04/23/2023 showed stable disease -Mr. Meiner appears stable.  He continues maintenance 5-FU/leuc and panitumumab  q2 weeks, tolerating well with mild nausea/vomiting.  We reviewed symptom management.  Weight is stable. -I reviewed his restaging CT CAP which is overall stable, no new sites of disease.  Will continue the current regimen. - Labs reviewed, adequate to proceed with another cycle of maintenance 5-FU/leuc and panitumumab  today as scheduled, no dose adjustments.  Will give 1 L NS for mild dehydration -Follow-up and next cycle in 2 weeks 07/15/2023 - CT CAP showed prior partial left hemicolectomy with end colostomy in the left anterior abdominal wall. Unchanged mild wall thickening of the decompressed  distal colon at the ileostomy as well as the rectal stump/Hartmann's pouch. Stable enlarged left lower paraesophageal lymph node. Increased size of the treated pulmonary nodule in the lingula now measuring 6 mm previously 4 mm with increased in size favored artifactual given motion degrading examination of the lung bases. Unchanged size of the treated pulmonary nodule in the right middle lobe measuring 4 mm. Increased size of prominent bilateral axillary lymph nodes measuring up to 7 mm in short axis, nonspecific. No convincing evidence of new or progressive disease in the chest, abdomen or pelvis. -Today is cycle 31 day 1    Plan: Labs reviewed  -CBC showing WBC 8.8; Hgb 10.8; Hct 33.1; Plt 360; Anc 7.2 -CMP - K 3.4; glucose 100; BUN 14; Creatinine 0.74; eGFR >60; Ca 9.1; LFTs normal -magnesium  - 1.2.   -patient to receive 2g magnesium  IV during treatment today.  -review CT CAP with patient and his mother. CT showing slight increase in size of previously treated lingular pulmonary nodule, going from 4mm to 6mm in diameter. This is possibly attributed to motion artifact. He also has increased size of bilateral axillary lymph nodes, up to 7 mm in diameter. No convincing evidence of metastatic disease  Patient condition and labs are satisfactory to proceed with treatment 31 day 1 Labs/flush, follow up, and treatment as scheduled 08/19/2023.  The patient understands the plans discussed today and is in agreement with them.  He knows to contact our office if he develops concerns prior to his next appointment.  I provided 30 minutes of face-to-face time during this encounter and > 50% was spent counseling as documented under my assessment and plan.    [name], NP  [facility] -  A DEPT OF [facility] [address] [place] [address] Dept: [phone] Dept Fax: [phone]   No orders of the defined types were placed in  this encounter.     CHIEF COMPLAINT:  CC: Follow-up of left colon cancer   Current Treatment: Second line maintenance chemotherapy 5-FU and Vectibix   INTERVAL HISTORY:  Matthew [name] is here today for repeat clinical assessment.  He last saw Lacie, NP on 07/22/2023.  He had a restaging CT CAP on 07/15/2023.  This showed prior partial left hemicolectomy with end colostomy in the left anterior abdominal wall. Unchanged mild wall thickening of the decompressed distal colon at the ileostomy as well as the rectal stump/Hartmann's pouch. Stable enlarged left lower paraesophageal lymph node. Increased size of the treated pulmonary nodule in the lingula now measuring 6 mm previously 4 mm with increased in size favored artifactual given motion degrading examination of the lung bases. Unchanged size of the treated pulmonary nodule in the right middle lobe measuring 4 mm. Increased size of prominent bilateral axillary lymph nodes measuring up to 7 mm in short axis, nonspecific. No convincing evidence of new or progressive disease in the chest, abdomen or pelvis.  For now, continue second line maintenance chemotherapy, 5-FU and Vectibix .  Today is cycle 31 day 1. He denies chest pain, chest pressure, or shortness of breath. He denies headaches or visual disturbances. He denies abdominal pain, nausea, vomiting, or changes in bowel or bladder habits.  His appetite is decreased, especially on day of treatment.  He denies fevers or chills. He denies pain. His weight has been stable.  I have reviewed the past medical history, past surgical history, social history and family history with the patient and they are unchanged from previous note.  ALLERGIES:  has no known allergies.  MEDICATIONS:  Current Outpatient Medications  Medication Sig Dispense Refill   acetaminophen  (TYLENOL ) 500 MG tablet Take 2 tablets (1,000 mg total) by mouth every 6 (six) hours as needed for mild pain or moderate pain.  0   apixaban  (ELIQUIS )  5 MG TABS tablet Take 1 tablet (5 mg total) by mouth 2 (two) times daily. 60 tablet 1   aspirin EC 81 MG tablet Take 81 mg by mouth daily as needed (for pain or headaches).      benztropine  (COGENTIN ) 1 MG tablet Take 1 mg at bedtime by mouth.     clindamycin  (CLINDAGEL) 1 % gel Apply topically 2 (two) times daily. To skin rash on face and upper body 30 g 0   ferrous sulfate  325 (65 FE) MG EC tablet Take 1 tablet (325 mg total) by mouth daily. 30 tablet 3   hydrocortisone  cream 1 % Apply 1 Application topically 2 (two) times daily as needed for itching. For rash 30 g 2   loperamide  (IMODIUM ) 2 MG capsule Take 1 capsule (2 mg total) by mouth as needed for diarrhea or loose stools. 30 capsule 0   magnesium  oxide (MAG-OX) 400 (240 Mg) MG tablet Take 1 tablet (400 mg total) by mouth daily. 30 tablet 2   Multiple Vitamin (MULTIVITAMIN WITH MINERALS) TABS tablet Take 1 tablet by mouth daily.     ondansetron  (ZOFRAN ) 8 MG tablet Take 1 tablet (8 mg total) by mouth every 8 (eight) hours as needed for nausea or vomiting. 30 tablet 0   paliperidone  (INVEGA  SUSTENNA) 156 MG/ML SUSP injection Inject 156 mg every 30 (thirty) days into the muscle.     polyethylene glycol (MIRALAX  /  GLYCOLAX ) 17 g packet Take 17 g by mouth daily as needed for mild constipation or moderate constipation.  0   potassium chloride  SA (KLOR-CON  M) 20 MEQ tablet Take 1 tablet (20 mEq total) by mouth daily. 30 tablet 2   prochlorperazine  (COMPAZINE ) 10 MG tablet TAKE 1 TABLET(10 MG) BY MOUTH EVERY 6 HOURS AS NEEDED FOR NAUSEA OR VOMITING 30 tablet 0   No current facility-administered medications for this visit.    HISTORY OF PRESENT ILLNESS:   Oncology History Overview Note   Cancer Staging  Cancer of left colon [facility]) Staging form: Colon and Rectum, AJCC 8th Edition - Pathologic stage from 08/08/2021: Stage IIIB (pT3, pN1a, cM0) - Signed by [name], MD on 08/26/2021    Cancer of left colon (HCC)  04/01/2020 Imaging    IMPRESSION: 1. Gallbladder decompressed bowel also partially calcified gallstones. No pericholecystic inflammation though if there is persisting clinical concern for cholecystitis right upper quadrant ultrasound could be obtained. 2. Circumferential thickening of the distal thoracic esophagus. Could reflect features of esophagitis. Correlate with clinical symptoms and consider endoscopy as clinically warranted. 3. Additional segmental thickening of the mid to distal sigmoid with focal narrowing. No acute surrounding inflammation or resulting obstruction. Findings are nonspecific, and could reflect sequela of prior inflammation/infection. However, recommend correlation with colonoscopy if not recently performed. 4. Mild circumferential bladder wall thickening and indentation of the bladder base by an enlarged prostate. Possibly sequela of chronic outlet obstruction though could correlate with urinalysis to exclude cystitis. 5. Aortic Atherosclerosis (ICD10-I70.0).   08/06/2021 Imaging   IMPRESSION: Sigmoid colonic perforation with small free intraperitoneal gas and infiltration of the mesenteric and omental fat in keeping with changes of peritonitis.   Long segment inflammatory stranding of the sigmoid colon in keeping with a severe infectious or inflammatory colitis. This terminates an area of irregular mural thickening, infiltrative soft tissue within the colonic mesentery, and focal dystrophic calcification. This may represent a chronic inflammatory process, however, a perforated malignancy could appear similarly. There are 2 separate points of perforation which again raise the question of an underlying malignancy.   7.1 cm gas and fluid containing pericolonic abscess within the sigmoid mesentery.   Marked inflammatory change of the terminal ileum adjacent to the pericolonic abscess with resultant small bowel obstruction. Fluid within the distal esophagus likely relates to  gastroesophageal reflux the setting of vomiting.   Aortic Atherosclerosis (ICD10-I70.0).   08/08/2021 Cancer Staging   Staging form: Colon and Rectum, AJCC 8th Edition - Pathologic stage from 08/08/2021: Stage IIIB (pT3, pN1a, cM0) - Signed by [name], MD on 08/26/2021 Stage prefix: Initial diagnosis Total positive nodes: 1 Histologic grading system: 4 grade system Histologic grade (G): G2 Residual tumor (R): R0 - None   08/08/2021 Definitive Surgery   FINAL MICROSCOPIC DIAGNOSIS:   A. COLON, SIGMOID, PARTIAL COLECTOMY:  - Invasive moderately differentiated adenocarcinoma.  - Metastatic carcinoma involving one of twelve lymph nodes (1/12).  - See oncology table below.   ADDENDUM:  Mismatch Repair Protein (IHC)  SUMMARY INTERPRETATION: NORMAL    08/14/2021 Imaging   EXAM: CT ABDOMEN AND PELVIS WITH CONTRAST  IMPRESSION: 1. Post recent sigmoid colectomy with left lower quadrant colostomy. Two small residual foci of air within the pelvic mesentery with mild adjacent thickening, but no abscess or drainable collection. Trace non organized free fluid and stranding in the pelvis. 2. Short segment of small bowel wall thickening and inflammation in the pelvis involving the distal ileum, likely reactive. 3. Dilated distal  esophagus, stomach, and small bowel, without discrete transition point, favoring postoperative ileus. 4. Small bilateral pleural effusions and compressive atelectasis. 5. Heterogeneous partially enhancing 14 mm lymph node in the retroperitoneum at the aortoiliac bifurcation, not significantly changed from prior exam. Suspected additional lymph nodes in the anterior common iliac space, not significantly changed from prior exam. Recommend attention at follow-up. 6. Additional chronic findings as described.     08/15/2021 Imaging   EXAM: CT CHEST WITH CONTRAST  IMPRESSION: 1. No evidence of thoracic metastasis. 2. Bilateral small layering pleural effusions with  passive atelectasis   08/26/2021 Initial Diagnosis   Cancer of left colon (HCC)   10/10/2021 - 12/12/2021 Chemotherapy   Patient is on Treatment Plan : COLORECTAL Xelox (Capeox) q21d     05/28/2022 - 05/28/2022 Chemotherapy   Patient is on Treatment Plan : COLORECTAL FOLFIRI + Bevacizumab q14d     05/28/2022 -  Chemotherapy   Patient is on Treatment Plan : COLORECTAL FOLFIRI + Panitumumab  q14d     07/31/2022 Imaging    IMPRESSION: Decreased bilateral pulmonary metastases.   Stable mild abdominal lymphadenopathy.   No new or progressive metastatic disease within the chest, abdomen, or pelvis.   01/18/2023 Imaging    IMPRESSION: 1. Status post Hetty pouch sigmoid colon resection with left lower quadrant end colostomy and rectal stump. Unchanged, mild wall thickening of the decompressed distal colon at the ileostomy, as well as the rectal stump. 2. Unchanged small, treated metastatic pulmonary nodules. No new nodules. 3. Unchanged enlarged, coarsely calcified treated lower aortocaval metastatic lymph nodes. No new lymphadenopathy. 4. Coronary artery disease. 5. Cholelithiasis.   04/23/2023 Imaging   CT chest, abdomen, and pelvis with contrast  IMPRESSION: 1. Left lower lobe segmental pulmonary emboli, incidental finding. No evidence for right heart strain. 2. Stable trace bilateral pleural effusions. 3. Stable enlarged lower left paraesophageal lymph node. 4. Stable calcified retroperitoneal lymph nodes. No new enlarged lymph nodes in the chest, abdomen or pelvis. 5. Stable subcentimeter soft tissue nodules within the mesenteric fat along the inferior ostomy. 6. Left Bosniak I benign renal cyst measuring 4.6 cm. No follow-up imaging is recommended. JACR 2018 Feb; 264-273, Management of the Incidental Renal Mass on CT, [employer] 2021; 814-848, Bosniak Classification of Cystic Renal Masses, Version 2019.   07/15/2023 Imaging   CT chest abdomen and pelvis with  contrast IMPRESSION: 1. Prior partial left hemicolectomy with end colostomy in the left anterior abdominal wall. Unchanged mild wall thickening of the decompressed distal colon at the ileostomy as well as the rectal stump/Hartmann's pouch. 2. Stable enlarged left lower paraesophageal lymph node. 3. Increased size of the treated pulmonary nodule in the lingula now measuring 6 mm previously 4 mm with increased in size favored artifactual given motion degrading examination of the lung bases. Suggest attention on follow-up imaging 4. Unchanged size of the treated pulmonary nodule in the right middle lobe measuring 4 mm. 5. Increased size of prominent bilateral axillary lymph nodes measuring up to 7 mm in short axis, nonspecific. Suggest attention on follow-up imaging 6. No convincing evidence of new or progressive disease in the chest, abdomen or pelvis. 7.  Aortic Atherosclerosis (ICD10-I70.0).         REVIEW OF SYSTEMS:   Constitutional: Denies fevers, chills or abnormal weight loss.  Decreased appetite, especially on day of treatment.  He states Eyes: Denies blurriness of vision Ears, nose, mouth, throat, and face: Denies mucositis or sore throat Respiratory: Denies cough, dyspnea or wheezes Cardiovascular: Denies palpitation, chest  discomfort or lower extremity swelling Gastrointestinal:  Denies nausea, heartburn or change in bowel habits Skin: Denies abnormal skin rashes Lymphatics: Denies new lymphadenopathy or easy bruising Neurological:Denies numbness, tingling or new weaknesses Behavioral/Psych: Mood is stable, no new changes  All other systems were reviewed with the patient and are negative.   VITALS:   Today's Vitals   08/05/23 1319 08/05/23 1321  BP: 109/86   Pulse: (!) 107   Resp: 18   Temp: 98.2 F (36.8 C)   TempSrc: Temporal   SpO2: 100%   Weight: 152 lb 1.6 oz (69 kg)   Height: 5' 9 (1.753 m)   PainSc:  0-No pain   Body mass index is 22.46 kg/m.   Wt  Readings from Last 3 Encounters:  08/05/23 152 lb 1.6 oz (69 kg)  07/22/23 153 lb 9.6 oz (69.7 kg)  07/08/23 153 lb 14.4 oz (69.8 kg)    Body mass index is 22.46 kg/m.  Performance status (ECOG): 1 - Symptomatic but completely ambulatory  PHYSICAL EXAM:   GENERAL:alert, no distress and comfortable SKIN: skin color, texture, turgor are normal, no rashes or significant lesions EYES: normal, Conjunctiva are pink and non-injected, sclera clear OROPHARYNX:no exudate, no erythema and lips, buccal mucosa, and tongue normal  NECK: supple, thyroid normal size, non-tender, without nodularity LYMPH:  no palpable lymphadenopathy in the cervical, axillary or inguinal LUNGS: clear to auscultation and percussion with normal breathing effort HEART: regular rate & rhythm and no murmurs and no lower extremity edema ABDOMEN:abdomen soft, non-tender and normal bowel sounds Musculoskeletal:no cyanosis of digits and no clubbing  NEURO: alert & oriented x 3 with fluent speech, no focal motor/sensory deficits  LABORATORY DATA:  I have reviewed the data as listed    Component Value Date/Time   NA 140 08/05/2023 1236   K 3.4 (L) 08/05/2023 1236   CL 108 08/05/2023 1236   CO2 27 08/05/2023 1236   GLUCOSE 100 (H) 08/05/2023 1236   BUN 14 08/05/2023 1236   CREATININE 0.74 08/05/2023 1236   CALCIUM  9.1 08/05/2023 1236   PROT 6.7 08/05/2023 1236   ALBUMIN 4.0 08/05/2023 1236   AST 15 08/05/2023 1236   ALT 12 08/05/2023 1236   ALKPHOS 95 08/05/2023 1236   BILITOT 0.7 08/05/2023 1236   GFRNONAA >60 08/05/2023 1236   GFRAA >60 04/01/2020 1237   Lab Results  Component Value Date   WBC 8.8 08/05/2023   NEUTROABS 7.2 08/05/2023   HGB 10.8 (L) 08/05/2023   HCT 33.1 (L) 08/05/2023   MCV 76.4 (L) 08/05/2023   PLT 360 08/05/2023     RADIOGRAPHIC STUDIES: CT CHEST ABDOMEN PELVIS W CONTRAST Result Date: 07/15/2023 CLINICAL DATA:  Colon cancer, assess treatment response. * Tracking Code: BO * EXAM: CT  CHEST, ABDOMEN, AND PELVIS WITH CONTRAST TECHNIQUE: Multidetector CT imaging of the chest, abdomen and pelvis was performed following the standard protocol during bolus administration of intravenous contrast. RADIATION DOSE REDUCTION: This exam was performed according to the departmental dose-optimization program which includes automated exposure control, adjustment of the mA and/or kV according to patient size and/or use of iterative reconstruction technique. CONTRAST:  OMNIPAQUE  IOHEXOL  300 MG/ML  SOLN COMPARISON:  Multiple priors including CT April 23, 2023. FINDINGS: CT CHEST FINDINGS Cardiovascular: Right chest Port-A-Cath with tip in the right atrium. Normal caliber thoracic aorta. No central pulmonary embolus on this nondedicated study. Normal size heart. Trace pericardial effusion similar prior. Mediastinum/Nodes: No suspicious thyroid nodule. Increased size of prominent bilateral axillary lymph  nodes measuring up to 7 mm in short axis on image 17/2. Low paraesophageal lymph node measures 9 mm in short axis on image 41/2 previously 10 mm. New no new pathologically enlarged mediastinal, hilar or axillary lymph nodes. Patulous esophagus with symmetric distal esophageal wall thickening, unchanged. Lungs/Pleura: Increased size of the treated pulmonary nodule in the lingula now measuring 6 mm on image 93/6 previously 4 mm with increased in size favored artifactual given motion degrading examination of the lung bases. Unchanged size of the treated pulmonary nodule in the right middle lobe measuring 4 mm on image 85/6. No new suspicious pulmonary nodules or masses. Musculoskeletal: No aggressive lytic or blastic lesion of bone. CT ABDOMEN PELVIS FINDINGS Hepatobiliary: Stable probable cyst or hemangioma in the right lobe of the liver on image 20/4. No solid enhancing hepatic lesion. Gallbladder is unremarkable. No biliary ductal dilation. Pancreas: Pancreatic ductal dilation or evidence of acute  inflammation. Spleen: No splenomegaly. Adrenals/Urinary Tract: Bilateral adrenal glands appear normal. No hydronephrosis. 4.7 cm left renal cyst is considered benign and requiring no independent imaging follow-up. No solid enhancing renal lesion. Urinary bladder is unremarkable for degree of distension. Stomach/Bowel: Radiopaque enteric contrast material traverses distal loops of small bowel. Stomach is unremarkable for degree of distension. No pathologic dilation of small or large bowel. Prior partial left hemicolectomy with Hartmann's pouch formation and end colostomy in the left anterior abdominal wall. Unchanged mild wall thickening of the decompressed distal colon at the ileostomy as well as the rectal stump/Hartmann's pouch. No new suspicious soft tissue nodularity. Vascular/Lymphatic: Normal caliber abdominal aorta. Scattered aortic atherosclerosis. Smooth IVC contours. The portal, splenic and superior mesenteric veins are patent. Coarsely calcified retroperitoneal lymph nodes measure up to 10 mm in short axis at the aortocaval station on image 85/2, unchanged. No newly enlarged suspicious pulmonary nodules in the abdomen or pelvis. Reproductive: Prostate is unremarkable. Other: No significant abdominopelvic free fluid. No discrete peritoneal or omental nodularity. Musculoskeletal: No aggressive lytic or blastic lesion of bone. IMPRESSION: 1. Prior partial left hemicolectomy with end colostomy in the left anterior abdominal wall. Unchanged mild wall thickening of the decompressed distal colon at the ileostomy as well as the rectal stump/Hartmann's pouch. 2. Stable enlarged left lower paraesophageal lymph node. 3. Increased size of the treated pulmonary nodule in the lingula now measuring 6 mm previously 4 mm with increased in size favored artifactual given motion degrading examination of the lung bases. Suggest attention on follow-up imaging 4. Unchanged size of the treated pulmonary nodule in the right  middle lobe measuring 4 mm. 5. Increased size of prominent bilateral axillary lymph nodes measuring up to 7 mm in short axis, nonspecific. Suggest attention on follow-up imaging 6. No convincing evidence of new or progressive disease in the chest, abdomen or pelvis. 7.  Aortic Atherosclerosis (ICD10-I70.0). Electronically Signed   By: Reyes Holder M.D.   On: 07/15/2023 14:00

## 2023-08-05 ENCOUNTER — Inpatient Hospital Stay: Payer: MEDICAID | Attending: Hematology

## 2023-08-05 ENCOUNTER — Inpatient Hospital Stay: Payer: MEDICAID

## 2023-08-05 ENCOUNTER — Inpatient Hospital Stay (HOSPITAL_BASED_OUTPATIENT_CLINIC_OR_DEPARTMENT_OTHER): Payer: MEDICAID | Attending: Hematology | Admitting: Nurse Practitioner

## 2023-08-05 VITALS — HR 99

## 2023-08-05 VITALS — BP 109/86 | HR 107 | Temp 98.2°F | Resp 18 | Ht 69.0 in | Wt 152.1 lb

## 2023-08-05 DIAGNOSIS — Z95828 Presence of other vascular implants and grafts: Secondary | ICD-10-CM

## 2023-08-05 DIAGNOSIS — C7801 Secondary malignant neoplasm of right lung: Secondary | ICD-10-CM | POA: Diagnosis not present

## 2023-08-05 DIAGNOSIS — Z79899 Other long term (current) drug therapy: Secondary | ICD-10-CM | POA: Insufficient documentation

## 2023-08-05 DIAGNOSIS — C7802 Secondary malignant neoplasm of left lung: Secondary | ICD-10-CM | POA: Diagnosis not present

## 2023-08-05 DIAGNOSIS — Z5111 Encounter for antineoplastic chemotherapy: Secondary | ICD-10-CM | POA: Diagnosis present

## 2023-08-05 DIAGNOSIS — C772 Secondary and unspecified malignant neoplasm of intra-abdominal lymph nodes: Secondary | ICD-10-CM | POA: Insufficient documentation

## 2023-08-05 DIAGNOSIS — C186 Malignant neoplasm of descending colon: Secondary | ICD-10-CM | POA: Diagnosis not present

## 2023-08-05 DIAGNOSIS — Z5112 Encounter for antineoplastic immunotherapy: Secondary | ICD-10-CM | POA: Diagnosis present

## 2023-08-05 LAB — CMP (CANCER CENTER ONLY)
ALT: 12 U/L (ref 0–44)
AST: 15 U/L (ref 15–41)
Albumin: 4 g/dL (ref 3.5–5.0)
Alkaline Phosphatase: 95 U/L (ref 38–126)
Anion gap: 5 (ref 5–15)
BUN: 14 mg/dL (ref 6–20)
CO2: 27 mmol/L (ref 22–32)
Calcium: 9.1 mg/dL (ref 8.9–10.3)
Chloride: 108 mmol/L (ref 98–111)
Creatinine: 0.74 mg/dL (ref 0.61–1.24)
GFR, Estimated: >60 mL/min (ref >60)
Glucose, Bld: 100 mg/dL — ABNORMAL HIGH (ref 70–99)
Potassium: 3.4 mmol/L — ABNORMAL LOW (ref 3.5–5.1)
Sodium: 140 mmol/L (ref 135–145)
Total Bilirubin: 0.7 mg/dL (ref 0.0–1.2)
Total Protein: 6.7 g/dL (ref 6.5–8.1)

## 2023-08-05 LAB — CBC WITH DIFFERENTIAL (CANCER CENTER ONLY)
Abs Immature Granulocytes: 0.02 10*3/uL (ref 0.00–0.07)
Basophils Absolute: 0 10*3/uL (ref 0.0–0.1)
Basophils Relative: 0 %
Eosinophils Absolute: 0.1 10*3/uL (ref 0.0–0.5)
Eosinophils Relative: 1 %
HCT: 33.1 % — ABNORMAL LOW (ref 39.0–52.0)
Hemoglobin: 10.8 g/dL — ABNORMAL LOW (ref 13.0–17.0)
Immature Granulocytes: 0 %
Lymphocytes Relative: 10 %
Lymphs Abs: 0.9 10*3/uL (ref 0.7–4.0)
MCH: 24.9 pg — ABNORMAL LOW (ref 26.0–34.0)
MCHC: 32.6 g/dL (ref 30.0–36.0)
MCV: 76.4 fL — ABNORMAL LOW (ref 80.0–100.0)
Monocytes Absolute: 0.6 10*3/uL (ref 0.1–1.0)
Monocytes Relative: 7 %
Neutro Abs: 7.2 10*3/uL (ref 1.7–7.7)
Neutrophils Relative %: 82 %
Platelet Count: 360 10*3/uL (ref 150–400)
RBC: 4.33 MIL/uL (ref 4.22–5.81)
RDW: 22 % — ABNORMAL HIGH (ref 11.5–15.5)
WBC Count: 8.8 10*3/uL (ref 4.0–10.5)
nRBC: 0 % (ref 0.0–0.2)

## 2023-08-05 LAB — MAGNESIUM: Magnesium: 1.2 mg/dL — ABNORMAL LOW (ref 1.7–2.4)

## 2023-08-05 MED ORDER — SODIUM CHLORIDE 0.9% FLUSH
10.0000 mL | Freq: Once | INTRAVENOUS | Status: AC
Start: 2023-08-05 — End: 2023-08-05
  Administered 2023-08-05: 10 mL via INTRAVENOUS

## 2023-08-05 MED ORDER — SODIUM CHLORIDE 0.9 % IV SOLN
400.0000 mg/m2 | Freq: Once | INTRAVENOUS | Status: AC
  Administered 2023-08-05: 728 mg via INTRAVENOUS
  Filled 2023-08-05: qty 25

## 2023-08-05 MED ORDER — DEXAMETHASONE SODIUM PHOSPHATE 10 MG/ML IJ SOLN
10.0000 mg | Freq: Once | INTRAMUSCULAR | Status: AC
  Administered 2023-08-05: 10 mg via INTRAVENOUS
  Filled 2023-08-05: qty 1

## 2023-08-05 MED ORDER — PALONOSETRON HCL INJECTION 0.25 MG/5ML
0.2500 mg | Freq: Once | INTRAVENOUS | Status: AC
  Administered 2023-08-05: 0.25 mg via INTRAVENOUS
  Filled 2023-08-05: qty 5

## 2023-08-05 MED ORDER — SODIUM CHLORIDE 0.9 % IV SOLN: Freq: Once | INTRAVENOUS | Status: AC

## 2023-08-05 MED ORDER — SODIUM CHLORIDE 0.9 % IV SOLN
2400.0000 mg/m2 | INTRAVENOUS | Status: DC
  Administered 2023-08-05: 4350 mg via INTRAVENOUS
  Filled 2023-08-05: qty 87

## 2023-08-05 MED ORDER — SODIUM CHLORIDE 0.9 % IV SOLN
6.0000 mg/kg | Freq: Once | INTRAVENOUS | Status: AC
  Administered 2023-08-05: 400 mg via INTRAVENOUS
  Filled 2023-08-05: qty 20

## 2023-08-05 MED ORDER — SODIUM CHLORIDE 0.9% FLUSH: 10.0000 mL | INTRAVENOUS | Status: DC | PRN

## 2023-08-05 MED ORDER — HEPARIN SOD (PORK) LOCK FLUSH 100 UNIT/ML IV SOLN: 500.0000 [IU] | Freq: Once | INTRAVENOUS | Status: DC | PRN

## 2023-08-05 MED ORDER — MAGNESIUM SULFATE 2 GM/50ML IV SOLN
2.0000 g | Freq: Once | INTRAVENOUS | Status: AC
  Administered 2023-08-05: 2 g via INTRAVENOUS
  Filled 2023-08-05: qty 50

## 2023-08-05 NOTE — Patient Instructions (Signed)
 [facility] - A DEPT OF [facility]  Discharge Instructions: Thank you for choosing [facility] to provide your oncology and hematology care.   If you have a lab appointment with the Cancer Center, please go directly to the Cancer Center and check in at the registration area.   Wear comfortable clothing and clothing appropriate for easy access to any Portacath or PICC line.   We strive to give you quality time with your provider. You may need to reschedule your appointment if you arrive late (15 or more minutes).  Arriving late affects you and other patients whose appointments are after yours.  Also, if you miss three or more appointments without notifying the office, you may be dismissed from the clinic at the provider's discretion.      For prescription refill requests, have your pharmacy contact our office and allow 72 hours for refills to be completed.    Today you received the following chemotherapy and/or immunotherapy agents: Vectibix , Fluorouracil       To help prevent nausea and vomiting after your treatment, we encourage you to take your nausea medication as directed.  BELOW ARE SYMPTOMS THAT SHOULD BE REPORTED IMMEDIATELY: *FEVER GREATER THAN 100.4 F (38 C) OR HIGHER *CHILLS OR SWEATING *NAUSEA AND VOMITING THAT IS NOT CONTROLLED WITH YOUR NAUSEA MEDICATION *UNUSUAL SHORTNESS OF BREATH *UNUSUAL BRUISING OR BLEEDING *URINARY PROBLEMS (pain or burning when urinating, or frequent urination) *BOWEL PROBLEMS (unusual diarrhea, constipation, pain near the anus) TENDERNESS IN MOUTH AND THROAT WITH OR WITHOUT PRESENCE OF ULCERS (sore throat, sores in mouth, or a toothache) UNUSUAL RASH, SWELLING OR PAIN  UNUSUAL VAGINAL DISCHARGE OR ITCHING   Items with * indicate a potential emergency and should be followed up as soon as possible or go to the Emergency Department if any problems should occur.  Please show the CHEMOTHERAPY ALERT CARD or  IMMUNOTHERAPY ALERT CARD at check-in to the Emergency Department and triage nurse.  Should you have questions after your visit or need to cancel or reschedule your appointment, please contact [facility] - A DEPT OF [facility]  Dept: [phone]  and follow the prompts.  Office hours are 8:00 a.m. to 4:30 p.m. Monday - Friday. Please note that voicemails left after 4:00 p.m. may not be returned until the following business day.  We are closed weekends and major holidays. You have access to a nurse at all times for urgent questions. Please call the main number to the clinic Dept: [phone] and follow the prompts.   For any non-urgent questions, you may also contact your provider using MyChart. We now offer e-Visits for anyone 26 and older to request care online for non-urgent symptoms. For details visit [email or web address].   Also download the MyChart app! Go to the app store, search MyChart, open the app, select [facility], and log in with your MyChart username and password.

## 2023-08-05 NOTE — Progress Notes (Signed)
 Per Herbert Seta, NP, okay to run Fluorouracil pump at 44 hours.

## 2023-08-07 ENCOUNTER — Inpatient Hospital Stay: Payer: MEDICAID

## 2023-08-07 VITALS — BP 103/76 | HR 103 | Temp 97.7°F | Resp 17

## 2023-08-07 DIAGNOSIS — C186 Malignant neoplasm of descending colon: Secondary | ICD-10-CM

## 2023-08-07 DIAGNOSIS — Z5111 Encounter for antineoplastic chemotherapy: Secondary | ICD-10-CM | POA: Diagnosis not present

## 2023-08-07 MED ORDER — SODIUM CHLORIDE 0.9% FLUSH
10.0000 mL | INTRAVENOUS | Status: DC | PRN
  Administered 2023-08-07: 10 mL

## 2023-08-07 MED ORDER — HEPARIN SOD (PORK) LOCK FLUSH 100 UNIT/ML IV SOLN
500.0000 [IU] | Freq: Once | INTRAVENOUS | Status: AC | PRN
  Administered 2023-08-07: 500 [IU]

## 2023-08-07 NOTE — Progress Notes (Signed)
 Patient came in for pump dc today. It was noticed that several cc's of chemo was still left in the bag. Patient stated, it kept beeping so I turned it off. Pump had not batteries in it. Dr. Lanny made aware, gave bolus to bolus 15cc's and waste the rest. Thu, RN attempted to bolus chemo, but pump stated upstream occlusion. RN noticed that line was hard to flush. Port was deaccessed without bolus. Dr. Lanny made aware.

## 2023-08-07 NOTE — Progress Notes (Signed)
 Pt here for pump dc. He stated he took batteries out of pump on yesterday due to continuous beeping. Chemo bag still full and pump was off without batteries. Oncall Dr called and requested for RN to bolus chemo as far as pump would allow. However, the port was hard to flush and no blood return noted. Port was flushed with saline and heparin  and deaccessed; and patient and his mother was educated on what to do next time the beeping happens.

## 2023-08-08 ENCOUNTER — Encounter: Payer: Self-pay | Admitting: Hematology

## 2023-08-08 ENCOUNTER — Encounter: Payer: Self-pay | Admitting: Nurse Practitioner

## 2023-08-13 ENCOUNTER — Other Ambulatory Visit: Payer: Self-pay

## 2023-08-19 ENCOUNTER — Inpatient Hospital Stay: Payer: MEDICAID | Admitting: Hematology

## 2023-08-19 ENCOUNTER — Inpatient Hospital Stay: Payer: MEDICAID

## 2023-08-19 VITALS — BP 102/87 | HR 93 | Temp 98.4°F | Resp 18 | Wt 149.2 lb

## 2023-08-19 VITALS — BP 119/89 | HR 98 | Temp 98.1°F | Resp 16 | Ht 69.0 in | Wt 150.7 lb

## 2023-08-19 DIAGNOSIS — C186 Malignant neoplasm of descending colon: Secondary | ICD-10-CM

## 2023-08-19 DIAGNOSIS — C7800 Secondary malignant neoplasm of unspecified lung: Secondary | ICD-10-CM

## 2023-08-19 DIAGNOSIS — Z95828 Presence of other vascular implants and grafts: Secondary | ICD-10-CM

## 2023-08-19 DIAGNOSIS — Z5111 Encounter for antineoplastic chemotherapy: Secondary | ICD-10-CM | POA: Diagnosis not present

## 2023-08-19 LAB — CBC WITH DIFFERENTIAL (CANCER CENTER ONLY)
Abs Immature Granulocytes: 0.01 10*3/uL (ref 0.00–0.07)
Basophils Absolute: 0 10*3/uL (ref 0.0–0.1)
Basophils Relative: 1 %
Eosinophils Absolute: 0.1 10*3/uL (ref 0.0–0.5)
Eosinophils Relative: 1 %
HCT: 32.1 % — ABNORMAL LOW (ref 39.0–52.0)
Hemoglobin: 10.4 g/dL — ABNORMAL LOW (ref 13.0–17.0)
Immature Granulocytes: 0 %
Lymphocytes Relative: 22 %
Lymphs Abs: 1.2 10*3/uL (ref 0.7–4.0)
MCH: 24 pg — ABNORMAL LOW (ref 26.0–34.0)
MCHC: 32.4 g/dL (ref 30.0–36.0)
MCV: 74.1 fL — ABNORMAL LOW (ref 80.0–100.0)
Monocytes Absolute: 0.4 10*3/uL (ref 0.1–1.0)
Monocytes Relative: 8 %
Neutro Abs: 3.5 10*3/uL (ref 1.7–7.7)
Neutrophils Relative %: 68 %
Platelet Count: 337 10*3/uL (ref 150–400)
RBC: 4.33 MIL/uL (ref 4.22–5.81)
RDW: 22.1 % — ABNORMAL HIGH (ref 11.5–15.5)
WBC Count: 5.2 10*3/uL (ref 4.0–10.5)
nRBC: 0 % (ref 0.0–0.2)

## 2023-08-19 LAB — CMP (CANCER CENTER ONLY)
ALT: 13 U/L (ref 0–44)
AST: 18 U/L (ref 15–41)
Albumin: 4 g/dL (ref 3.5–5.0)
Alkaline Phosphatase: 96 U/L (ref 38–126)
Anion gap: 7 (ref 5–15)
BUN: 9 mg/dL (ref 6–20)
CO2: 26 mmol/L (ref 22–32)
Calcium: 8.9 mg/dL (ref 8.9–10.3)
Chloride: 106 mmol/L (ref 98–111)
Creatinine: 0.65 mg/dL (ref 0.61–1.24)
GFR, Estimated: >60 mL/min (ref >60)
Glucose, Bld: 92 mg/dL (ref 70–99)
Potassium: 3.4 mmol/L — ABNORMAL LOW (ref 3.5–5.1)
Sodium: 139 mmol/L (ref 135–145)
Total Bilirubin: 0.9 mg/dL (ref 0.0–1.2)
Total Protein: 6.6 g/dL (ref 6.5–8.1)

## 2023-08-19 LAB — MAGNESIUM: Magnesium: 1.1 mg/dL — ABNORMAL LOW (ref 1.7–2.4)

## 2023-08-19 MED ORDER — HEPARIN SOD (PORK) LOCK FLUSH 100 UNIT/ML IV SOLN
500.0000 [IU] | Freq: Once | INTRAVENOUS | Status: DC | PRN
Start: 2023-08-19 — End: 2023-08-19

## 2023-08-19 MED ORDER — SODIUM CHLORIDE 0.9% FLUSH
10.0000 mL | Freq: Once | INTRAVENOUS | Status: AC
  Administered 2023-08-19: 10 mL

## 2023-08-19 MED ORDER — PALONOSETRON HCL INJECTION 0.25 MG/5ML
0.2500 mg | Freq: Once | INTRAVENOUS | Status: DC
Start: 2023-08-19 — End: 2023-08-19

## 2023-08-19 MED ORDER — ALTEPLASE 2 MG IJ SOLR
2.0000 mg | Freq: Once | INTRAMUSCULAR | Status: DC | PRN
Start: 2023-08-19 — End: 2023-08-19

## 2023-08-19 MED ORDER — SODIUM CHLORIDE 0.9 % IV SOLN
6.0000 mg/kg | Freq: Once | INTRAVENOUS | Status: AC
Start: 2023-08-19 — End: 2023-08-19
  Administered 2023-08-19: 400 mg via INTRAVENOUS
  Filled 2023-08-19: qty 20

## 2023-08-19 MED ORDER — MAGNESIUM SULFATE 4 GM/100ML IV SOLN
4.0000 g | Freq: Once | INTRAVENOUS | Status: AC
  Administered 2023-08-19: 4 g via INTRAVENOUS
  Filled 2023-08-19: qty 100

## 2023-08-19 MED ORDER — SODIUM CHLORIDE 0.9% FLUSH: 10.0000 mL | INTRAVENOUS | Status: DC | PRN

## 2023-08-19 MED ORDER — FLUOROURACIL CHEMO INJECTION 5 GM/100ML
2400.0000 mg/m2 | INTRAVENOUS | Status: DC
  Administered 2023-08-19: 4350 mg via INTRAVENOUS
  Filled 2023-08-19: qty 87

## 2023-08-19 MED ORDER — DEXAMETHASONE SODIUM PHOSPHATE 10 MG/ML IJ SOLN: 10.0000 mg | Freq: Once | INTRAMUSCULAR | Status: DC

## 2023-08-19 MED ORDER — APIXABAN 5 MG PO TABS: 5.0000 mg | ORAL_TABLET | Freq: Two times a day (BID) | ORAL | 2 refills | Status: DC

## 2023-08-19 MED ORDER — SODIUM CHLORIDE 0.9 % IV SOLN
Freq: Once | INTRAVENOUS | Status: AC
Start: 2023-08-19 — End: 2023-08-19

## 2023-08-19 MED ORDER — HEPARIN SOD (PORK) LOCK FLUSH 100 UNIT/ML IV SOLN: 250.0000 [IU] | Freq: Once | INTRAVENOUS | Status: DC | PRN

## 2023-08-19 MED ORDER — SODIUM CHLORIDE 0.9% FLUSH: 3.0000 mL | INTRAVENOUS | Status: DC | PRN

## 2023-08-19 MED ORDER — LEUCOVORIN CALCIUM INJECTION 350 MG
400.0000 mg/m2 | Freq: Once | INTRAMUSCULAR | Status: AC
  Administered 2023-08-19: 728 mg via INTRAVENOUS
  Filled 2023-08-19: qty 17.5

## 2023-08-19 NOTE — Progress Notes (Signed)
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 -we have changed his treatment to maintenance therapy with 5-fu and vectibix in late March 2024 -He is tolerating treatment very well, will continue. -due to metastasis in both lungs and retroperitoneal lymph nodes, he is not a candidate for surgery or local therapy  -restaging CT12/07/2023 showed stable disease overall, with minimal increase of tiny pulmonary nodule and axillary adenopathy.  Will continue current therapy.   Assessment and Plan    Metastatic Colon Cancer Follow-up for metastatic colon cancer. Blood counts are stable. Kidney and liver functions are normal. Potassium is slightly low. Discussed the need for continued treatment and the plan to increase magnesium supplementation due to low levels. Informed about the importance of maintaining electrolyte balance and the potential risks of low magnesium and  potassium levels. - Proceed with treatment - Schedule three to four more cycles of treatment - Follow-up in two weeks  Hypomagnesemia Magnesium levels are very low. Current oral magnesium supplementation is insufficient. Discussed increasing oral magnesium to three times a day and administering 4 grams of magnesium during treatment sessions. - Increase oral magnesium to three times a day - Administer 4 grams of magnesium during treatment sessions  Hypokalemia Potassium levels are slightly low. Discussed continuing potassium supplementation with half a tablet once a day and the option to split the tablet for easier ingestion. - Continue potassium supplementation with half a tablet once a day  Schizophrenia -Stable, continue medication  General Health Maintenance Has not received flu shot for this season. No previous issues with flu shots reported. - Administer flu shot today  Plan -Lab reviewed, adequate for treatment, will proceed with Vectibix and leucovorin/5-FU pump infusion today and continue every 2 weeks -Increase IV magnesium from 2 g to 4 g with each treatment -Increase oral magnesium to 3 times a day, and oral potassium half tablets a day -Follow-up in 2 weeks        SUMMARY OF ONCOLOGIC HISTORY: Oncology History Overview Note   Cancer Staging  Cancer of left colon [facility]) Staging form: Colon and Rectum, AJCC 8th Edition - Pathologic stage from 08/08/2021: Stage IIIB (pT3, pN1a, cM0) - Signed by Malachy Mood, MD on 08/26/2021    Cancer of left colon (HCC)  04/01/2020 Imaging   IMPRESSION: 1. Gallbladder decompressed bowel also partially calcified gallstones. No pericholecystic inflammation though if there is persisting clinical concern for cholecystitis right upper quadrant ultrasound could be obtained. 2. Circumferential thickening of the distal thoracic esophagus. Could reflect features of esophagitis. Correlate with clinical  symptoms and consider endoscopy as  clinically warranted. 3. Additional segmental thickening of the mid to distal sigmoid with focal narrowing. No acute surrounding inflammation or resulting obstruction. Findings are nonspecific, and could reflect sequela of prior inflammation/infection. However, recommend correlation with colonoscopy if not recently performed. 4. Mild circumferential bladder wall thickening and indentation of the bladder base by an enlarged prostate. Possibly sequela of chronic outlet obstruction though could correlate with urinalysis to exclude cystitis. 5. Aortic Atherosclerosis (ICD10-I70.0).   08/06/2021 Imaging   IMPRESSION: Sigmoid colonic perforation with small free intraperitoneal gas and infiltration of the mesenteric and omental fat in keeping with changes of peritonitis.   Long segment inflammatory stranding of the sigmoid colon in keeping with a severe infectious or inflammatory colitis. This terminates an area of irregular mural thickening, infiltrative soft tissue within the colonic mesentery, and focal dystrophic calcification. This may represent a chronic inflammatory process, however, a perforated malignancy could appear similarly. There are 2 separate points of perforation which again raise the question of an underlying malignancy.   7.1 cm gas and fluid containing pericolonic abscess within the sigmoid mesentery.   Marked inflammatory change of the terminal ileum adjacent to the pericolonic abscess with resultant small bowel obstruction. Fluid within the distal esophagus likely relates to gastroesophageal reflux the setting of vomiting.   Aortic Atherosclerosis (ICD10-I70.0).   08/08/2021 Cancer Staging   Staging form: Colon and Rectum, AJCC 8th Edition - Pathologic stage from 08/08/2021: Stage IIIB (pT3, pN1a, cM0) - Signed by Malachy Mood, MD on 08/26/2021 Stage prefix: Initial diagnosis Total positive nodes: 1 Histologic grading system: 4 grade system Histologic grade (G): G2 Residual  tumor (R): R0 - None   08/08/2021 Definitive Surgery   FINAL MICROSCOPIC DIAGNOSIS:   A. COLON, SIGMOID, PARTIAL COLECTOMY:  - Invasive moderately differentiated adenocarcinoma.  - Metastatic carcinoma involving one of twelve lymph nodes (1/12).  - See oncology table below.   ADDENDUM:  Mismatch Repair Protein (IHC)  SUMMARY INTERPRETATION: NORMAL    08/14/2021 Imaging   EXAM: CT ABDOMEN AND PELVIS WITH CONTRAST  IMPRESSION: 1. Post recent sigmoid colectomy with left lower quadrant colostomy. Two small residual foci of air within the pelvic mesentery with mild adjacent thickening, but no abscess or drainable collection. Trace non organized free fluid and stranding in the pelvis. 2. Short segment of small bowel wall thickening and inflammation in the pelvis involving the distal ileum, likely reactive. 3. Dilated distal esophagus, stomach, and small bowel, without discrete transition point, favoring postoperative ileus. 4. Small bilateral pleural effusions and compressive atelectasis. 5. Heterogeneous partially enhancing 14 mm lymph node in the retroperitoneum at the aortoiliac bifurcation, not significantly changed from prior exam. Suspected additional lymph nodes in the anterior common iliac space, not significantly changed from prior exam. Recommend attention at follow-up. 6. Additional chronic findings as described.     08/15/2021 Imaging   EXAM: CT CHEST WITH CONTRAST  IMPRESSION: 1. No evidence of thoracic metastasis. 2. Bilateral small layering pleural effusions with passive atelectasis   08/26/2021 Initial Diagnosis   Cancer of left colon (HCC)   10/10/2021 - 12/12/2021 Chemotherapy   [name] is on Treatment Plan : COLORECTAL Xelox (Capeox) q21d     05/28/2022 - 05/28/2022 Chemotherapy   [name] is on Treatment Plan : COLORECTAL FOLFIRI + Bevacizumab q14d     05/28/2022 -  Chemotherapy   [name] is on Treatment Plan : COLORECTAL FOLFIRI + Panitumumab q14d      07/31/2022 Imaging    IMPRESSION: Decreased bilateral  pulmonary metastases.   Stable mild abdominal lymphadenopathy.   No new or progressive metastatic disease within the chest, abdomen, or pelvis.   01/18/2023 Imaging    IMPRESSION: 1. Status post Gertie Gowda pouch sigmoid colon resection with left lower quadrant end colostomy and rectal stump. Unchanged, mild wall thickening of the decompressed distal colon at the ileostomy, as well as the rectal stump. 2. Unchanged small, treated metastatic pulmonary nodules. No new nodules. 3. Unchanged enlarged, coarsely calcified treated lower aortocaval metastatic lymph nodes. No new lymphadenopathy. 4. Coronary artery disease. 5. Cholelithiasis.   04/23/2023 Imaging   CT chest, abdomen, and pelvis with contrast  IMPRESSION: 1. Left lower lobe segmental pulmonary emboli, incidental finding. No evidence for right heart strain. 2. Stable trace bilateral pleural effusions. 3. Stable enlarged lower left paraesophageal lymph node. 4. Stable calcified retroperitoneal lymph nodes. No new enlarged lymph nodes in the chest, abdomen or pelvis. 5. Stable subcentimeter soft tissue nodules within the mesenteric fat along the inferior ostomy. 6. Left Bosniak I benign renal cyst measuring 4.6 cm. No follow-up imaging is recommended. JACR 2018 Feb; 264-273, Management of the Incidental Renal Mass on CT, [employer] 2021; 814-848, Bosniak Classification of Cystic Renal Masses, Version 2019.   07/15/2023 Imaging   CT chest abdomen and pelvis with contrast IMPRESSION: 1. Prior partial left hemicolectomy with end colostomy in the left anterior abdominal wall. Unchanged mild wall thickening of the decompressed distal colon at the ileostomy as well as the rectal stump/Hartmann's pouch. 2. Stable enlarged left lower paraesophageal lymph node. 3. Increased size of the treated pulmonary nodule in the lingula now measuring 6 mm previously 4 mm with increased in  size favored artifactual given motion degrading examination of the lung bases. Suggest attention on follow-up imaging 4. Unchanged size of the treated pulmonary nodule in the right middle lobe measuring 4 mm. 5. Increased size of prominent bilateral axillary lymph nodes measuring up to 7 mm in short axis, nonspecific. Suggest attention on follow-up imaging 6. No convincing evidence of new or progressive disease in the chest, abdomen or pelvis. 7.  Aortic Atherosclerosis (ICD10-I70.0).        Discussed the use of AI scribe software for clinical note transcription with the [name], who gave verbal consent to proceed.  History of Present Illness   A Matthew [name] with a known diagnosis of metastatic colon cancer presents for a follow-up visit. The [name] is currently undergoing treatment, the specifics of which are not detailed in the conversation. The [name] reports no issues or complications from the last treatment. The [name] also has a history of rash, which has reportedly improved.  The [name] is also managing diabetes, as indicated by the use of insulin and Trulicity. The [name]'s caregiver confirms adherence to the medication regimen, including Eliquis, taken twice daily. The [name]'s appetite and energy levels are reported to be satisfactory, and the [name] is active during the day.  The [name]'s caregiver requests a refill of Eliquis during the visit. The [name]'s caregiver also mentions a need for a refill of insulin, but it is unclear how long the [name] has been without it.  The [name]'s blood counts are reported to be good, and the [name] confirms adherence to metformin. However, the [name]'s potassium and magnesium levels are reported to be low, despite the [name] taking supplements for both.         All other systems were reviewed with the [name] and are negative.  MEDICAL HISTORY:  Past Medical History:  Diagnosis Date  Cancer (HCC)    Schizophrenia  [facility])     SURGICAL HISTORY: Past Surgical History:  Procedure Laterality Date   BRONCHIAL BIOPSY  05/11/2022   Procedure: BRONCHIAL BIOPSIES;  Surgeon: Leslye Peer, MD;  Location: [facility] ENDOSCOPY;  Service: Pulmonary;;   BRONCHIAL BRUSHINGS  05/11/2022   Procedure: BRONCHIAL BRUSHINGS;  Surgeon: Leslye Peer, MD;  Location: [facility] ENDOSCOPY;  Service: Pulmonary;;   BRONCHIAL NEEDLE ASPIRATION BIOPSY  05/11/2022   Procedure: BRONCHIAL NEEDLE ASPIRATION BIOPSIES;  Surgeon: Leslye Peer, MD;  Location: [facility] ENDOSCOPY;  Service: Pulmonary;;   BRONCHIAL WASHINGS  05/11/2022   Procedure: BRONCHIAL WASHINGS;  Surgeon: Leslye Peer, MD;  Location: [facility] ENDOSCOPY;  Service: Pulmonary;;   IR IMAGING GUIDED PORT INSERTION  05/25/2022   LAPAROTOMY N/A 08/08/2021   Procedure: EXPLORATORY LAPAROTOMY;  Surgeon: Berna Bue, MD;  Location: [facility] OR;  Service: General;  Laterality: N/A;   PARTIAL COLECTOMY N/A 08/08/2021   Procedure: PARTIAL COLECTOMY WITH COLOSTOMY;  Surgeon: Berna Bue, MD;  Location: [facility] OR;  Service: General;  Laterality: N/A;   VIDEO BRONCHOSCOPY WITH RADIAL ENDOBRONCHIAL ULTRASOUND  05/11/2022   Procedure: VIDEO BRONCHOSCOPY WITH RADIAL ENDOBRONCHIAL ULTRASOUND;  Surgeon: Leslye Peer, MD;  Location: [facility] ENDOSCOPY;  Service: Pulmonary;;    I have reviewed the social history and family history with the [name] and they are unchanged from previous note.  ALLERGIES:  has no known allergies.  MEDICATIONS:  Current Outpatient Medications  Medication Sig Dispense Refill   acetaminophen (TYLENOL) 500 MG tablet Take 2 tablets (1,000 mg total) by mouth every 6 (six) hours as needed for mild pain or moderate pain.  0   apixaban (ELIQUIS) 5 MG TABS tablet Take 1 tablet (5 mg total) by mouth 2 (two) times daily. 60 tablet 2   aspirin EC 81 MG tablet Take 81 mg by mouth daily as needed (for pain or headaches).      benztropine (COGENTIN) 1 MG tablet Take 1 mg at bedtime by mouth.      clindamycin (CLINDAGEL) 1 % gel Apply topically 2 (two) times daily. To skin rash on face and upper body 30 g 0   ferrous sulfate 325 (65 FE) MG EC tablet Take 1 tablet (325 mg total) by mouth daily. 30 tablet 3   hydrocortisone cream 1 % Apply 1 Application topically 2 (two) times daily as needed for itching. For rash 30 g 2   loperamide (IMODIUM) 2 MG capsule Take 1 capsule (2 mg total) by mouth as needed for diarrhea or loose stools. 30 capsule 0   magnesium oxide (MAG-OX) 400 (240 Mg) MG tablet Take 1 tablet (400 mg total) by mouth daily. 30 tablet 2   Multiple Vitamin (MULTIVITAMIN WITH MINERALS) TABS tablet Take 1 tablet by mouth daily.     ondansetron (ZOFRAN) 8 MG tablet Take 1 tablet (8 mg total) by mouth every 8 (eight) hours as needed for nausea or vomiting. 30 tablet 0   paliperidone (INVEGA SUSTENNA) 156 MG/ML SUSP injection Inject 156 mg every 30 (thirty) days into the muscle.     polyethylene glycol (MIRALAX / GLYCOLAX) 17 g packet Take 17 g by mouth daily as needed for mild constipation or moderate constipation.  0   potassium chloride SA (KLOR-CON M) 20 MEQ tablet Take 1 tablet (20 mEq total) by mouth daily. 30 tablet 2   prochlorperazine (COMPAZINE) 10 MG tablet TAKE 1 TABLET(10 MG) BY MOUTH EVERY 6 HOURS AS NEEDED FOR NAUSEA OR VOMITING  30 tablet 0   No current facility-administered medications for this visit.   Facility-Administered Medications Ordered in Other Visits  Medication Dose Route Frequency Provider Last Rate Last Admin   alteplase (CATHFLO ACTIVASE) injection 2 mg  2 mg Intracatheter Once PRN Malachy Mood, MD       fluorouracil (ADRUCIL) 4,350 mg in sodium chloride 0.9 % 63 mL chemo infusion  2,400 mg/m2 (Treatment Plan Recorded) Intravenous 1 day or 1 dose Malachy Mood, MD       heparin lock flush 100 unit/mL  500 Units Intracatheter Once PRN Malachy Mood, MD       heparin lock flush 100 unit/mL  250 Units Intracatheter Once PRN Malachy Mood, MD       leucovorin 728 mg in  sodium chloride 0.9 % 250 mL infusion  400 mg/m2 (Treatment Plan Recorded) Intravenous Once Malachy Mood, MD       magnesium sulfate IVPB 4 g 100 mL  4 g Intravenous Once Malachy Mood, MD 50 mL/hr at 08/19/23 1325 4 g at 08/19/23 1325   panitumumab (VECTIBIX) 400 mg in sodium chloride 0.9 % 100 mL chemo infusion  6 mg/kg (Treatment Plan Recorded) Intravenous Once Malachy Mood, MD       sodium chloride flush (NS) 0.9 % injection 10 mL  10 mL Intracatheter PRN Malachy Mood, MD       sodium chloride flush (NS) 0.9 % injection 3 mL  3 mL Intracatheter PRN Malachy Mood, MD        PHYSICAL EXAMINATION: ECOG PERFORMANCE STATUS: 1 - Symptomatic but completely ambulatory  Vitals:   08/19/23 1226  BP: 119/89  Pulse: 98  Resp: 16  Temp: 98.1 F (36.7 C)  SpO2: 100%   Wt Readings from Last 3 Encounters:  08/19/23 150 lb 11.2 oz (68.4 kg)  08/19/23 149 lb 4 oz (Matthew.7 kg)  08/05/23 152 lb 1.6 oz (69 kg)     GENERAL:alert, no distress and comfortable SKIN: skin color, texture, turgor are normal, a few acne-like rash on his face EYES: normal, Conjunctiva are pink and non-injected, sclera clear NECK: supple, thyroid normal size, non-tender, without nodularity LYMPH:  no palpable lymphadenopathy in the cervical, axillary  LUNGS: clear to auscultation and percussion with normal breathing effort HEART: regular rate & rhythm and no murmurs and no lower extremity edema ABDOMEN:abdomen soft, non-tender and normal bowel sounds Musculoskeletal:no cyanosis of digits and no clubbing  NEURO: alert & oriented x 3 with fluent speech, no focal motor/sensory deficits   LABORATORY DATA:  I have reviewed the data as listed    Latest Ref Rng & Units 08/19/2023   12:06 PM 08/05/2023   12:36 PM 07/22/2023    8:53 AM  CBC  WBC 4.0 - 10.5 K/uL 5.2  8.8  3.3   Hemoglobin 13.0 - 17.0 g/dL 09.8  11.9  14.7   Hematocrit 39.0 - 52.0 % 32.1  33.1  35.2   Platelets 150 - 400 K/uL 337  360  364         Latest Ref Rng & Units  08/19/2023   12:06 PM 08/05/2023   12:36 PM 07/22/2023    8:53 AM  CMP  Glucose 70 - 99 mg/dL 92  829  562   BUN 6 - 20 mg/dL 9  14  8    Creatinine 0.61 - 1.24 mg/dL 1.30  8.65  7.84   Sodium 135 - 145 mmol/L 139  140  140   Potassium 3.5 - 5.1 mmol/L 3.4  3.4  3.4   Chloride 98 - 111 mmol/L 106  108  106   CO2 22 - 32 mmol/L 26  27  23    Calcium 8.9 - 10.3 mg/dL 8.9  9.1  9.1   Total Protein 6.5 - 8.1 g/dL 6.6  6.7  6.4   Total Bilirubin 0.0 - 1.2 mg/dL 0.9  0.7  0.8   Alkaline Phos 38 - 126 U/L 96  95  96   AST 15 - 41 U/L 18  15  14    ALT 0 - 44 U/L 13  12  11        RADIOGRAPHIC STUDIES: I have personally reviewed the radiological images as listed and agreed with the findings in the report. No results found.    Orders Placed This Encounter  Procedures   CBC with Differential (Cancer Center Only)    Standing Status:   Future    Expected Date:   09/02/2023    Expiration Date:   09/01/2024   CMP (Cancer Center only)    Standing Status:   Future    Expected Date:   09/02/2023    Expiration Date:   09/01/2024   Magnesium    Standing Status:   Future    Expected Date:   09/02/2023    Expiration Date:   09/01/2024   CBC with Differential (Cancer Center Only)    Standing Status:   Future    Expected Date:   09/16/2023    Expiration Date:   09/15/2024   CMP (Cancer Center only)    Standing Status:   Future    Expected Date:   09/16/2023    Expiration Date:   09/15/2024   Magnesium    Standing Status:   Future    Expected Date:   09/16/2023    Expiration Date:   09/15/2024   CBC with Differential (Cancer Center Only)    Standing Status:   Future    Expected Date:   09/30/2023    Expiration Date:   09/29/2024   CMP (Cancer Center only)    Standing Status:   Future    Expected Date:   09/30/2023    Expiration Date:   09/29/2024   Magnesium    Standing Status:   Future    Expected Date:   09/30/2023    Expiration Date:   09/29/2024   CBC with Differential (Cancer Center Only)     Standing Status:   Future    Expected Date:   10/14/2023    Expiration Date:   10/13/2024   CMP (Cancer Center only)    Standing Status:   Future    Expected Date:   10/14/2023    Expiration Date:   10/13/2024   Magnesium    Standing Status:   Future    Expected Date:   10/14/2023    Expiration Date:   10/13/2024   CBC with Differential (Cancer Center Only)    Standing Status:   Future    Expected Date:   10/28/2023    Expiration Date:   10/27/2024   CMP (Cancer Center only)    Standing Status:   Future    Expected Date:   10/28/2023    Expiration Date:   10/27/2024   Magnesium    Standing Status:   Future    Expected Date:   10/28/2023    Expiration Date:   10/27/2024   All questions were answered. The [name] knows to call the clinic with any  problems, questions or concerns. No barriers to learning was detected. The total time spent in the appointment was 25 minutes.     Malachy Mood, MD 08/19/2023

## 2023-08-19 NOTE — Patient Instructions (Signed)
Take Magnesium3 times per day [facility] - A DEPT OF [facility]  Discharge Instructions: Thank you for choosing [facility] to provide your oncology and hematology care.   If you have a lab appointment with the Cancer Center, please go directly to the Cancer Center and check in at the registration area.   Wear comfortable clothing and clothing appropriate for easy access to any Portacath or PICC line.   We strive to give you quality time with your provider. You may need to reschedule your appointment if you arrive late (15 or more minutes).  Arriving late affects you and other patients whose appointments are after yours.  Also, if you miss three or more appointments without notifying the office, you may be dismissed from the clinic at the provider's discretion.      For prescription refill requests, have your pharmacy contact our office and allow 72 hours for refills to be completed.    Today you received the following chemotherapy and/or immunotherapy agents: Vectibix, Fluorouracil      To help prevent nausea and vomiting after your treatment, we encourage you to take your nausea medication as directed.  BELOW ARE SYMPTOMS THAT SHOULD BE REPORTED IMMEDIATELY: *FEVER GREATER THAN 100.4 F (38 C) OR HIGHER *CHILLS OR SWEATING *NAUSEA AND VOMITING THAT IS NOT CONTROLLED WITH YOUR NAUSEA MEDICATION *UNUSUAL SHORTNESS OF BREATH *UNUSUAL BRUISING OR BLEEDING *URINARY PROBLEMS (pain or burning when urinating, or frequent urination) *BOWEL PROBLEMS (unusual diarrhea, constipation, pain near the anus) TENDERNESS IN MOUTH AND THROAT WITH OR WITHOUT PRESENCE OF ULCERS (sore throat, sores in mouth, or a toothache) UNUSUAL RASH, SWELLING OR PAIN  UNUSUAL VAGINAL DISCHARGE OR ITCHING   Items with * indicate a potential emergency and should be followed up as soon as possible or go to the Emergency Department if any problems should occur.  Please show  the CHEMOTHERAPY ALERT CARD or IMMUNOTHERAPY ALERT CARD at check-in to the Emergency Department and triage nurse.  Should you have questions after your visit or need to cancel or reschedule your appointment, please contact [facility] - A [facility]  Dept: [phone]  and follow the prompts.  Office hours are 8:00 a.m. to 4:30 p.m. Monday - Friday. Please note that voicemails left after 4:00 p.m. may not be returned until the following business day.  We are closed weekends and major holidays. You have access to a nurse at all times for urgent questions. Please call the main number to the clinic Dept: [phone] and follow the prompts.   For any non-urgent questions, you may also contact your provider using MyChart. We now offer e-Visits for anyone 38 and older to request care online for non-urgent symptoms. For details visit [email or web address].   Also download the MyChart app! Go to the app store, search "MyChart", open the app, select [place], and log in with your MyChart username and password.  Hypomagnesemia Hypomagnesemia is a condition in which the level of magnesium in the blood is too low. Magnesium is a mineral that is found in many foods. It is used in many different processes in the body. Hypomagnesemia can affect every organ in the body. In severe cases, it can cause life-threatening problems. What are the causes? This condition may be caused by: Not getting enough magnesium in your diet or not having enough healthy foods to eat (malnutrition). Problems with magnesium absorption in the intestines.  Dehydration. Excessive use of alcohol. Vomiting. Severe or long-term (chronic) diarrhea. Some medicines, including medicines that make you urinate more often (diuretics). Certain diseases, such as kidney disease, diabetes, celiac disease, and overactive thyroid. What are the signs or symptoms? Symptoms of this condition include: Loss of  appetite, nausea, and vomiting. Involuntary shaking or trembling of a body part (tremor). Muscle weakness or tingling in the arms and legs. Sudden tightening of muscles (muscle spasms). Confusion. Psychiatric issues, such as: Depression and irritability. Psychosis. A feeling of fluttering of the heart (palpitations). Seizures. These symptoms are more severe if magnesium levels drop suddenly. How is this diagnosed? This condition may be diagnosed based on: Your symptoms and medical history. A physical exam. Blood and urine tests. How is this treated? Treatment depends on the cause and the severity of the condition. It may be treated by: Taking a magnesium supplement. This can be taken in pill form. If the condition is severe, magnesium is usually given through an IV. Making changes to your diet. You may be directed to eat foods that have a lot of magnesium, such as green leafy vegetables, peas, beans, and nuts. Not drinking alcohol. If you are struggling not to drink, ask your health care provider for help. Follow these instructions at home: Eating and drinking     Make sure that your diet includes foods with magnesium. Foods that have a lot of magnesium in them include: Green leafy vegetables, such as spinach and broccoli. Beans and peas. Nuts and seeds, such as almonds and sunflower seeds. Whole grains, such as whole grain bread and fortified cereals. Drink fluids that contain salts and minerals (electrolytes), such as sports drinks, when you are active. Do not drink alcohol. General instructions Take over-the-counter and prescription medicines only as told by your health care provider. Take magnesium supplements as directed if your health care provider tells you to take them. Have your magnesium levels monitored as told by your health care provider. Keep all follow-up visits. This is important. Contact a health care provider if: You get worse instead of better. Your symptoms  return. Get help right away if: You develop severe muscle weakness. You have trouble breathing. You feel that your heart is racing. These symptoms may represent a serious problem that is an emergency. Do not wait to see if the symptoms will go away. Get medical help right away. Call your local emergency services (911 in the [place] Do not drive yourself to the hospital. Summary Hypomagnesemia is a condition in which the level of magnesium in the blood is too low. Hypomagnesemia can affect every organ in the body. Treatment may include eating more foods that contain magnesium, taking magnesium supplements, and not drinking alcohol. Have your magnesium levels monitored as told by your health care provider. This information is not intended to replace advice given to you by your health care provider. Make sure you discuss any questions you have with your health care provider. Document Revised: 12/17/2020 Document Reviewed: 12/17/2020 Elsevier Patient Education  2024 [employer].

## 2023-08-19 NOTE — Assessment & Plan Note (Signed)
stage IIIB p(T3, N1aM0), MSS, KRAS/NRAS/BRAF wild type, lung and node metastasis in 04/2022  -Initially diagnosed in 08/2021 -he completed 4 cycles adjuvant CAPOX 10/10/21 - 12/25/21 (though he somehow was still taking Xeloda through ~02/09/22 due to misunderstandings).  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 -we have changed his treatment to maintenance therapy with 5-fu and vectibix in late March 2024 -He is tolerating treatment very well, will continue. -due to metastasis in both lungs and retroperitoneal lymph nodes, he is not a candidate for surgery or local therapy  -restaging CT12/07/2023 showed stable disease overall, with minimal increase of tiny pulmonary nodule and axillary adenopathy.  Will continue current therapy.

## 2023-08-19 NOTE — Progress Notes (Signed)
Ok to infuse home pump at 3.36mL/hr to infuse over 44 hrs per Dr Mosetta Putt

## 2023-08-19 NOTE — Progress Notes (Signed)
Aloxi & Dex IV premed not needed; low emetogenicity.  OK to remove from plan per Dr. Mosetta Putt.  Ebony Hail, Pharm.D., CPP 08/19/2023@1 :47 PM

## 2023-08-21 ENCOUNTER — Inpatient Hospital Stay: Payer: MEDICAID

## 2023-08-21 VITALS — BP 100/73 | HR 107 | Temp 100.3°F | Resp 18

## 2023-08-21 DIAGNOSIS — Z5111 Encounter for antineoplastic chemotherapy: Secondary | ICD-10-CM | POA: Diagnosis not present

## 2023-08-21 DIAGNOSIS — C186 Malignant neoplasm of descending colon: Secondary | ICD-10-CM

## 2023-08-21 MED ORDER — SODIUM CHLORIDE 0.9% FLUSH
10.0000 mL | INTRAVENOUS | Status: DC | PRN
Start: 2023-08-21 — End: 2023-08-21
  Administered 2023-08-21: 10 mL

## 2023-08-21 MED ORDER — HEPARIN SOD (PORK) LOCK FLUSH 100 UNIT/ML IV SOLN
500.0000 [IU] | Freq: Once | INTRAVENOUS | Status: AC | PRN
Start: 2023-08-21 — End: 2023-08-21
  Administered 2023-08-21: 500 [IU]

## 2023-08-22 ENCOUNTER — Other Ambulatory Visit: Payer: Self-pay

## 2023-08-31 NOTE — Assessment & Plan Note (Signed)
stage IIIB p(T3, N1aM0), MSS, KRAS/NRAS/BRAF wild type, lung and node metastasis in 04/2022  -Initially diagnosed in 08/2021 -he completed 4 cycles adjuvant CAPOX 10/10/21 - 12/25/21 (though he somehow was still taking Xeloda through ~02/09/22 due to misunderstandings).  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 -we have changed his treatment to maintenance therapy with 5-fu and vectibix in late March 2024 -He is tolerating treatment very well, will continue. -due to metastasis in both lungs and retroperitoneal lymph nodes, he is not a candidate for surgery or local therapy  -restaging CT12/07/2023 showed stable disease overall, with minimal increase of tiny pulmonary nodule and axillary adenopathy.  Will continue current therapy.

## 2023-09-01 ENCOUNTER — Other Ambulatory Visit: Payer: Self-pay

## 2023-09-01 ENCOUNTER — Inpatient Hospital Stay (HOSPITAL_BASED_OUTPATIENT_CLINIC_OR_DEPARTMENT_OTHER): Payer: MEDICAID | Admitting: Hematology

## 2023-09-01 ENCOUNTER — Encounter: Payer: Self-pay | Admitting: Hematology

## 2023-09-01 ENCOUNTER — Other Ambulatory Visit (HOSPITAL_COMMUNITY): Payer: Self-pay

## 2023-09-01 ENCOUNTER — Inpatient Hospital Stay: Payer: MEDICAID

## 2023-09-01 ENCOUNTER — Inpatient Hospital Stay (HOSPITAL_BASED_OUTPATIENT_CLINIC_OR_DEPARTMENT_OTHER): Payer: MEDICAID

## 2023-09-01 ENCOUNTER — Telehealth: Payer: Self-pay

## 2023-09-01 VITALS — BP 104/74 | HR 82 | Temp 97.9°F | Resp 16 | Wt 151.3 lb

## 2023-09-01 DIAGNOSIS — C186 Malignant neoplasm of descending colon: Secondary | ICD-10-CM

## 2023-09-01 DIAGNOSIS — Z95828 Presence of other vascular implants and grafts: Secondary | ICD-10-CM

## 2023-09-01 DIAGNOSIS — C7800 Secondary malignant neoplasm of unspecified lung: Secondary | ICD-10-CM

## 2023-09-01 DIAGNOSIS — Z5111 Encounter for antineoplastic chemotherapy: Secondary | ICD-10-CM | POA: Diagnosis not present

## 2023-09-01 DIAGNOSIS — R509 Fever, unspecified: Secondary | ICD-10-CM

## 2023-09-01 LAB — URINALYSIS, COMPLETE (UACMP) WITH MICROSCOPIC
Bacteria, UA: NONE SEEN
Bilirubin Urine: NEGATIVE
Glucose, UA: NEGATIVE mg/dL
Hgb urine dipstick: NEGATIVE
Ketones, ur: NEGATIVE mg/dL
Leukocytes,Ua: NEGATIVE
Nitrite: NEGATIVE
Protein, ur: NEGATIVE mg/dL
Specific Gravity, Urine: 1.018 (ref 1.005–1.030)
pH: 5 (ref 5.0–8.0)

## 2023-09-01 LAB — CBC WITH DIFFERENTIAL (CANCER CENTER ONLY)
Abs Immature Granulocytes: 0.01 10*3/uL (ref 0.00–0.07)
Basophils Absolute: 0 10*3/uL (ref 0.0–0.1)
Basophils Relative: 1 %
Eosinophils Absolute: 0.1 10*3/uL (ref 0.0–0.5)
Eosinophils Relative: 3 %
HCT: 32.4 % — ABNORMAL LOW (ref 39.0–52.0)
Hemoglobin: 10.3 g/dL — ABNORMAL LOW (ref 13.0–17.0)
Immature Granulocytes: 0 %
Lymphocytes Relative: 21 %
Lymphs Abs: 0.9 10*3/uL (ref 0.7–4.0)
MCH: 24 pg — ABNORMAL LOW (ref 26.0–34.0)
MCHC: 31.8 g/dL (ref 30.0–36.0)
MCV: 75.5 fL — ABNORMAL LOW (ref 80.0–100.0)
Monocytes Absolute: 0.4 10*3/uL (ref 0.1–1.0)
Monocytes Relative: 8 %
Neutro Abs: 2.9 10*3/uL (ref 1.7–7.7)
Neutrophils Relative %: 67 %
Platelet Count: 398 10*3/uL (ref 150–400)
RBC: 4.29 MIL/uL (ref 4.22–5.81)
RDW: 21.6 % — ABNORMAL HIGH (ref 11.5–15.5)
WBC Count: 4.3 10*3/uL (ref 4.0–10.5)
nRBC: 0 % (ref 0.0–0.2)

## 2023-09-01 LAB — CMP (CANCER CENTER ONLY)
ALT: 16 U/L (ref 0–44)
AST: 16 U/L (ref 15–41)
Albumin: 3.9 g/dL (ref 3.5–5.0)
Alkaline Phosphatase: 101 U/L (ref 38–126)
Anion gap: 7 (ref 5–15)
BUN: 11 mg/dL (ref 6–20)
CO2: 24 mmol/L (ref 22–32)
Calcium: 8.7 mg/dL — ABNORMAL LOW (ref 8.9–10.3)
Chloride: 109 mmol/L (ref 98–111)
Creatinine: 0.64 mg/dL (ref 0.61–1.24)
GFR, Estimated: >60 mL/min (ref >60)
Glucose, Bld: 101 mg/dL — ABNORMAL HIGH (ref 70–99)
Potassium: 3.7 mmol/L (ref 3.5–5.1)
Sodium: 140 mmol/L (ref 135–145)
Total Bilirubin: 0.5 mg/dL (ref 0.0–1.2)
Total Protein: 6.6 g/dL (ref 6.5–8.1)

## 2023-09-01 LAB — MAGNESIUM: Magnesium: 0.8 mg/dL — CL (ref 1.7–2.4)

## 2023-09-01 MED ORDER — MAGNESIUM SULFATE 4 GM/100ML IV SOLN
4.0000 g | Freq: Once | INTRAVENOUS | Status: AC
  Administered 2023-09-01: 4 g via INTRAVENOUS
  Filled 2023-09-01: qty 100

## 2023-09-01 MED ORDER — SODIUM CHLORIDE 0.9 % IV SOLN
400.0000 mg/m2 | Freq: Once | INTRAVENOUS | Status: AC
  Administered 2023-09-01: 728 mg via INTRAVENOUS
  Filled 2023-09-01: qty 25

## 2023-09-01 MED ORDER — FLUOROURACIL CHEMO INJECTION 5 GM/100ML
2400.0000 mg/m2 | INTRAVENOUS | Status: DC
  Administered 2023-09-01: 4350 mg via INTRAVENOUS
  Filled 2023-09-01: qty 87

## 2023-09-01 MED ORDER — SODIUM CHLORIDE 0.9% FLUSH
10.0000 mL | Freq: Once | INTRAVENOUS | Status: AC
  Administered 2023-09-01: 10 mL

## 2023-09-01 MED ORDER — MAGNESIUM OXIDE -MG SUPPLEMENT 400 (240 MG) MG PO TABS
400.0000 mg | ORAL_TABLET | Freq: Every day | ORAL | 2 refills | Status: DC
  Filled 2023-09-01: qty 30, 30d supply, fill #0

## 2023-09-01 MED ORDER — SODIUM CHLORIDE 0.9 % IV SOLN: Freq: Once | INTRAVENOUS | Status: AC

## 2023-09-01 MED ORDER — MAGNESIUM OXIDE -MG SUPPLEMENT 400 (240 MG) MG PO TABS: 400.0000 mg | ORAL_TABLET | Freq: Every day | ORAL | 2 refills | Status: DC

## 2023-09-01 NOTE — Telephone Encounter (Signed)
Critical lab value reported:  Mg+ 0.8   Dr. Mosetta Putt notified.  Verbal order to hold Panitumumab and give 4gm Mg+ today and 4gm Mg+ when pt returns on Friday w/Pump D/C.  Dr. Mosetta Putt stated to have the pt come in next Tuesday for labs and possible Mg+ infusion if pt's Mg+ is still low.

## 2023-09-01 NOTE — Patient Instructions (Signed)
Take Magnesium 3 times per day [name]  Discharge Instructions: Thank you for choosing [name] to provide your oncology and hematology care.   If you have a lab appointment with the [name], please go directly to the [name] and check in at the registration area.   Wear comfortable clothing and clothing appropriate for easy access to any Portacath or PICC line.   We strive to give you quality time with your provider. You may need to reschedule your appointment if you arrive late (15 or more minutes).  Arriving late affects you and other patients whose appointments are after yours.  Also, if you miss three or more appointments without notifying the office, you may be dismissed from the clinic at the provider's discretion.      For prescription refill requests, have your pharmacy contact our office and allow 72 hours for refills to be completed.    Today you received the following chemotherapy and/or immunotherapy agents: Leukovorin, Fluorouracil , Magnesium   To help prevent nausea and vomiting after your treatment, we encourage you to take your nausea medication as directed.  BELOW ARE SYMPTOMS THAT SHOULD BE REPORTED IMMEDIATELY: *FEVER GREATER THAN 100.4 F (38 C) OR HIGHER *CHILLS OR SWEATING *NAUSEA AND VOMITING THAT IS NOT CONTROLLED WITH YOUR NAUSEA MEDICATION *UNUSUAL SHORTNESS OF BREATH *UNUSUAL BRUISING OR BLEEDING *URINARY PROBLEMS (pain or burning when urinating, or frequent urination) *BOWEL PROBLEMS (unusual diarrhea, constipation, pain near the anus) TENDERNESS IN MOUTH AND THROAT WITH OR WITHOUT PRESENCE OF ULCERS (sore throat, sores in mouth, or a toothache) UNUSUAL RASH, SWELLING OR PAIN  UNUSUAL VAGINAL DISCHARGE OR ITCHING   Items with * indicate a potential emergency and should be followed up as soon as possible or go to the Emergency Department if any problems should  occur.  Please show the CHEMOTHERAPY ALERT CARD or IMMUNOTHERAPY ALERT CARD at check-in to the Emergency Department and triage nurse.  Should you have questions after your visit or need to cancel or reschedule your appointment, please contact [facility]  Dept: [phone]  and follow the prompts.  Office hours are 8:00 a.m. to 4:30 p.m. Monday - Friday. Please note that voicemails left after 4:00 p.m. may not be returned until the following business day.  We are closed weekends and major holidays. You have access to a nurse at all times for urgent questions. Please call the main number to the clinic Dept: [phone] and follow the prompts.   For any non-urgent questions, you may also contact your provider using MyChart. We now offer e-Visits for anyone 73 and older to request care online for non-urgent symptoms. For details visit [email or web address].   Also download the MyChart app! Go to the app store, search "MyChart", open the app, select [place], and log in with your MyChart username and password.  Hypomagnesemia Hypomagnesemia is a condition in which the level of magnesium in the blood is too low. Magnesium is a mineral that is found in many foods. It is used in many different processes in the body. Hypomagnesemia can affect every organ in the body. In severe cases, it can cause life-threatening problems. What are the causes? This condition may be caused by: Not getting enough magnesium in your diet or not having enough healthy foods to eat (malnutrition). Problems with magnesium absorption in the intestines.  Dehydration. Excessive use of alcohol. Vomiting. Severe or long-term (chronic) diarrhea. Some medicines, including medicines that make you urinate more often (diuretics). Certain diseases, such as kidney disease, diabetes, celiac disease, and overactive thyroid. What are the signs or symptoms? Symptoms of this  condition include: Loss of appetite, nausea, and vomiting. Involuntary shaking or trembling of a body part (tremor). Muscle weakness or tingling in the arms and legs. Sudden tightening of muscles (muscle spasms). Confusion. Psychiatric issues, such as: Depression and irritability. Psychosis. A feeling of fluttering of the heart (palpitations). Seizures. These symptoms are more severe if magnesium levels drop suddenly. How is this diagnosed? This condition may be diagnosed based on: Your symptoms and medical history. A physical exam. Blood and urine tests. How is this treated? Treatment depends on the cause and the severity of the condition. It may be treated by: Taking a magnesium supplement. This can be taken in pill form. If the condition is severe, magnesium is usually given through an IV. Making changes to your diet. You may be directed to eat foods that have a lot of magnesium, such as green leafy vegetables, peas, beans, and nuts. Not drinking alcohol. If you are struggling not to drink, ask your health care provider for help. Follow these instructions at home: Eating and drinking     Make sure that your diet includes foods with magnesium. Foods that have a lot of magnesium in them include: Green leafy vegetables, such as spinach and broccoli. Beans and peas. Nuts and seeds, such as almonds and sunflower seeds. Whole grains, such as whole grain bread and fortified cereals. Drink fluids that contain salts and minerals (electrolytes), such as sports drinks, when you are active. Do not drink alcohol. General instructions Take over-the-counter and prescription medicines only as told by your health care provider. Take magnesium supplements as directed if your health care provider tells you to take them. Have your magnesium levels monitored as told by your health care provider. Keep all follow-up visits. This is important. Contact a health care provider if: You get worse  instead of better. Your symptoms return. Get help right away if: You develop severe muscle weakness. You have trouble breathing. You feel that your heart is racing. These symptoms may represent a serious problem that is an emergency. Do not wait to see if the symptoms will go away. Get medical help right away. Call your local emergency services (911 in the [place] Do not drive yourself to the hospital. Summary Hypomagnesemia is a condition in which the level of magnesium in the blood is too low. Hypomagnesemia can affect every organ in the body. Treatment may include eating more foods that contain magnesium, taking magnesium supplements, and not drinking alcohol. Have your magnesium levels monitored as told by your health care provider. This information is not intended to replace advice given to you by your health care provider. Make sure you discuss any questions you have with your health care provider. Document Revised: 12/17/2020 Document Reviewed: 12/17/2020 Elsevier Patient Education  2024 [employer].

## 2023-09-01 NOTE — Progress Notes (Signed)
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 -we have changed his treatment to maintenance therapy with 5-fu and vectibix in late March 2024 -He is tolerating treatment very well, will continue. -due to metastasis in both lungs and retroperitoneal lymph nodes, he is not a candidate for surgery or local therapy  -restaging CT12/07/2023 showed stable disease overall, with minimal increase of tiny pulmonary nodule and axillary adenopathy.  Will continue current therapy.   Assessment and Plan    Metastatic Colon Cancer Follow-up for metastatic colon cancer. Disease is well-managed per the last CT scan in December 2024, with primary involvement in the lungs and lymph nodes. Small pulmonary nodules and slightly enlarged mediastinal and axillary lymph nodes. Tolerating chemotherapy without significant side effects. Mild anemia (Hb 10.3), consistent with previous results. Weight stable at 151 lbs. Cancer  has responded well to treatment with significant tumor reduction. Continued chemotherapy is essential for disease management. Discussed potential chemotherapy risks, including further anemia and other side effects, and the importance of monitoring blood counts. Caregiver managing medication administration, including potassium supplementation. - Order CT scan in February 2025 - Continue current chemotherapy regimen - Monitor blood counts regularly - Continue potassium supplementation as prescribed - Review medications at next visit, bring all medication bottles   Hypomagnesemia  -Secondary to Vectibix, serum magnesium 0.8 today -Patient is not compliant with oral mag, we again encouraged him to take 3 times a day -Will give IV mag 4 g today, and additional 2 g with pump DC.  Will hold Vectibix today -Schedule lab and additional IV mag next week  Follow-up -Lab reviewed, adequate for treatment, will proceed chemo 5-FU pump infusion and leucovorin today  -Hold Vectibix due to severe hypomagnesemia.  Will give IV mag today and in 2 days  -Lab and IV mag next week  -schedule follow-up visit in February 2025 for CT scan and evaluation.         SUMMARY OF ONCOLOGIC HISTORY: Oncology History Overview Note   Cancer Staging  Cancer of left colon [facility]) Staging form: Colon and Rectum, AJCC 8th Edition - Pathologic stage from 08/08/2021: Stage IIIB (pT3, pN1a, cM0) - Signed by Malachy Mood, MD on 08/26/2021    Cancer of left colon (HCC)  04/01/2020 Imaging   IMPRESSION: 1. Gallbladder decompressed bowel also partially calcified gallstones. No pericholecystic inflammation though if there is persisting clinical concern for cholecystitis right upper quadrant ultrasound could be obtained. 2. Circumferential thickening of the distal thoracic esophagus. Could reflect features of esophagitis. Correlate with clinical symptoms and consider endoscopy as clinically warranted. 3.  Additional segmental  thickening of the mid to distal sigmoid with focal narrowing. No acute surrounding inflammation or resulting obstruction. Findings are nonspecific, and could reflect sequela of prior inflammation/infection. However, recommend correlation with colonoscopy if not recently performed. 4. Mild circumferential bladder wall thickening and indentation of the bladder base by an enlarged prostate. Possibly sequela of chronic outlet obstruction though could correlate with urinalysis to exclude cystitis. 5. Aortic Atherosclerosis (ICD10-I70.0).   08/06/2021 Imaging   IMPRESSION: Sigmoid colonic perforation with small free intraperitoneal gas and infiltration of the mesenteric and omental fat in keeping with changes of peritonitis.   Long segment inflammatory stranding of the sigmoid colon in keeping with a severe infectious or inflammatory colitis. This terminates an area of irregular mural thickening, infiltrative soft tissue within the colonic mesentery, and focal dystrophic calcification. This may represent a chronic inflammatory process, however, a perforated malignancy could appear similarly. There are 2 separate points of perforation which again raise the question of an underlying malignancy.   7.1 cm gas and fluid containing pericolonic abscess within the sigmoid mesentery.   Marked inflammatory change of the terminal ileum adjacent to the pericolonic abscess with resultant small bowel obstruction. Fluid within the distal esophagus likely relates to gastroesophageal reflux the setting of vomiting.   Aortic Atherosclerosis (ICD10-I70.0).   08/08/2021 Cancer Staging   Staging form: Colon and Rectum, AJCC 8th Edition - Pathologic stage from 08/08/2021: Stage IIIB (pT3, pN1a, cM0) - Signed by Malachy Mood, MD on 08/26/2021 Stage prefix: Initial diagnosis Total positive nodes: 1 Histologic grading system: 4 grade system Histologic grade (G): G2 Residual tumor (R): R0 - None   08/08/2021 Definitive  Surgery   FINAL MICROSCOPIC DIAGNOSIS:   A. COLON, SIGMOID, PARTIAL COLECTOMY:  - Invasive moderately differentiated adenocarcinoma.  - Metastatic carcinoma involving one of twelve lymph nodes (1/12).  - See oncology table below.   ADDENDUM:  Mismatch Repair Protein (IHC)  SUMMARY INTERPRETATION: NORMAL    08/14/2021 Imaging   EXAM: CT ABDOMEN AND PELVIS WITH CONTRAST  IMPRESSION: 1. Post recent sigmoid colectomy with left lower quadrant colostomy. Two small residual foci of air within the pelvic mesentery with mild adjacent thickening, but no abscess or drainable collection. Trace non organized free fluid and stranding in the pelvis. 2. Short segment of small bowel wall thickening and inflammation in the pelvis involving the distal ileum, likely reactive. 3. Dilated distal esophagus, stomach, and small bowel, without discrete transition point, favoring postoperative ileus. 4. Small bilateral pleural effusions and compressive atelectasis. 5. Heterogeneous partially enhancing 14 mm lymph node in the retroperitoneum at the aortoiliac bifurcation, not significantly changed from prior exam. Suspected additional lymph nodes in the anterior common iliac space, not significantly changed from prior exam. Recommend attention at follow-up. 6. Additional chronic findings as described.     08/15/2021 Imaging   EXAM: CT CHEST WITH CONTRAST  IMPRESSION: 1. No evidence of thoracic metastasis. 2. Bilateral small layering pleural effusions with passive atelectasis   08/26/2021 Initial Diagnosis   Cancer of left colon (HCC)   10/10/2021 - 12/12/2021 Chemotherapy   Patient is on Treatment Plan : COLORECTAL Xelox (Capeox) q21d     05/28/2022 - 05/28/2022 Chemotherapy   Patient is on Treatment Plan : COLORECTAL FOLFIRI + Bevacizumab q14d     05/28/2022 -  Chemotherapy   Patient is on Treatment Plan : COLORECTAL FOLFIRI + Panitumumab q14d     07/31/2022 Imaging    IMPRESSION: Decreased  bilateral pulmonary metastases.   Stable mild abdominal lymphadenopathy.  No new or progressive metastatic disease within the chest, abdomen, or pelvis.   01/18/2023 Imaging    IMPRESSION: 1. Status post Gertie Gowda pouch sigmoid colon resection with left lower quadrant end colostomy and rectal stump. Unchanged, mild wall thickening of the decompressed distal colon at the ileostomy, as well as the rectal stump. 2. Unchanged small, treated metastatic pulmonary nodules. No new nodules. 3. Unchanged enlarged, coarsely calcified treated lower aortocaval metastatic lymph nodes. No new lymphadenopathy. 4. Coronary artery disease. 5. Cholelithiasis.   04/23/2023 Imaging   CT chest, abdomen, and pelvis with contrast  IMPRESSION: 1. Left lower lobe segmental pulmonary emboli, incidental finding. No evidence for right heart strain. 2. Stable trace bilateral pleural effusions. 3. Stable enlarged lower left paraesophageal lymph node. 4. Stable calcified retroperitoneal lymph nodes. No new enlarged lymph nodes in the chest, abdomen or pelvis. 5. Stable subcentimeter soft tissue nodules within the mesenteric fat along the inferior ostomy. 6. Left Bosniak I benign renal cyst measuring 4.6 cm. No follow-up imaging is recommended. JACR 2018 Feb; 264-273, Management of the Incidental Renal Mass on CT, [employer] 2021; 814-848, Bosniak Classification of Cystic Renal Masses, Version 2019.   07/15/2023 Imaging   CT chest abdomen and pelvis with contrast IMPRESSION: 1. Prior partial left hemicolectomy with end colostomy in the left anterior abdominal wall. Unchanged mild wall thickening of the decompressed distal colon at the ileostomy as well as the rectal stump/Hartmann's pouch. 2. Stable enlarged left lower paraesophageal lymph node. 3. Increased size of the treated pulmonary nodule in the lingula now measuring 6 mm previously 4 mm with increased in size favored artifactual given motion degrading  examination of the lung bases. Suggest attention on follow-up imaging 4. Unchanged size of the treated pulmonary nodule in the right middle lobe measuring 4 mm. 5. Increased size of prominent bilateral axillary lymph nodes measuring up to 7 mm in short axis, nonspecific. Suggest attention on follow-up imaging 6. No convincing evidence of new or progressive disease in the chest, abdomen or pelvis. 7.  Aortic Atherosclerosis (ICD10-I70.0).        Discussed the use of AI scribe software for clinical note transcription with the patient, who gave verbal consent to proceed.  History of Present Illness   The patient, a 58 year old with a history of metastatic colon cancer, presents for a routine follow-up visit. He reports no new symptoms or complaints since his last visit. The patient's caregiver, who is also present, expresses concerns about the cost of the patient's insurance copay. The patient's current medication regimen includes a large pill, possibly potassium, which is crushed and administered daily. The patient's weight remains stable, and he reports no issues with his bowel movements or mouth sores. The patient's caregiver inquires about the location of the cancer, which is confirmed to be in the lungs.         All other systems were reviewed with the patient and are negative.  MEDICAL HISTORY:  Past Medical History:  Diagnosis Date   Cancer (HCC)    Schizophrenia [facility])     SURGICAL HISTORY: Past Surgical History:  Procedure Laterality Date   BRONCHIAL BIOPSY  05/11/2022   Procedure: BRONCHIAL BIOPSIES;  Surgeon: Leslye Peer, MD;  Location: [facility] ENDOSCOPY;  Service: Pulmonary;;   BRONCHIAL BRUSHINGS  05/11/2022   Procedure: BRONCHIAL BRUSHINGS;  Surgeon: Leslye Peer, MD;  Location: [facility] ENDOSCOPY;  Service: Pulmonary;;   BRONCHIAL NEEDLE ASPIRATION BIOPSY  05/11/2022   Procedure: BRONCHIAL NEEDLE ASPIRATION BIOPSIES;  Surgeon: Leslye Peer,  MD;  Location: [facility] ENDOSCOPY;   Service: Pulmonary;;   BRONCHIAL WASHINGS  05/11/2022   Procedure: BRONCHIAL WASHINGS;  Surgeon: Leslye Peer, MD;  Location: [facility] ENDOSCOPY;  Service: Pulmonary;;   IR IMAGING GUIDED PORT INSERTION  05/25/2022   LAPAROTOMY N/A 08/08/2021   Procedure: EXPLORATORY LAPAROTOMY;  Surgeon: Berna Bue, MD;  Location: [facility] OR;  Service: General;  Laterality: N/A;   PARTIAL COLECTOMY N/A 08/08/2021   Procedure: PARTIAL COLECTOMY WITH COLOSTOMY;  Surgeon: Berna Bue, MD;  Location: [facility] OR;  Service: General;  Laterality: N/A;   VIDEO BRONCHOSCOPY WITH RADIAL ENDOBRONCHIAL ULTRASOUND  05/11/2022   Procedure: VIDEO BRONCHOSCOPY WITH RADIAL ENDOBRONCHIAL ULTRASOUND;  Surgeon: Leslye Peer, MD;  Location: [facility] ENDOSCOPY;  Service: Pulmonary;;    I have reviewed the social history and family history with the patient and they are unchanged from previous note.  ALLERGIES:  has no known allergies.  MEDICATIONS:  Current Outpatient Medications  Medication Sig Dispense Refill   acetaminophen (TYLENOL) 500 MG tablet Take 2 tablets (1,000 mg total) by mouth every 6 (six) hours as needed for mild pain or moderate pain.  0   apixaban (ELIQUIS) 5 MG TABS tablet Take 1 tablet (5 mg total) by mouth 2 (two) times daily. 60 tablet 2   aspirin EC 81 MG tablet Take 81 mg by mouth daily as needed (for pain or headaches).      benztropine (COGENTIN) 1 MG tablet Take 1 mg at bedtime by mouth.     clindamycin (CLINDAGEL) 1 % gel Apply topically 2 (two) times daily. To skin rash on face and upper body 30 g 0   ferrous sulfate 325 (65 FE) MG EC tablet Take 1 tablet (325 mg total) by mouth daily. 30 tablet 3   hydrocortisone cream 1 % Apply 1 Application topically 2 (two) times daily as needed for itching. For rash 30 g 2   loperamide (IMODIUM) 2 MG capsule Take 1 capsule (2 mg total) by mouth as needed for diarrhea or loose stools. 30 capsule 0   magnesium oxide (MAG-OX) 400 (240 Mg) MG tablet Take 1 tablet (400 mg  total) by mouth daily. 30 tablet 2   Multiple Vitamin (MULTIVITAMIN WITH MINERALS) TABS tablet Take 1 tablet by mouth daily.     ondansetron (ZOFRAN) 8 MG tablet Take 1 tablet (8 mg total) by mouth every 8 (eight) hours as needed for nausea or vomiting. 30 tablet 0   paliperidone (INVEGA SUSTENNA) 156 MG/ML SUSP injection Inject 156 mg every 30 (thirty) days into the muscle.     polyethylene glycol (MIRALAX / GLYCOLAX) 17 g packet Take 17 g by mouth daily as needed for mild constipation or moderate constipation.  0   potassium chloride SA (KLOR-CON M) 20 MEQ tablet Take 1 tablet (20 mEq total) by mouth daily. 30 tablet 2   prochlorperazine (COMPAZINE) 10 MG tablet TAKE 1 TABLET(10 MG) BY MOUTH EVERY 6 HOURS AS NEEDED FOR NAUSEA OR VOMITING 30 tablet 0   No current facility-administered medications for this visit.   Facility-Administered Medications Ordered in Other Visits  Medication Dose Route Frequency Provider Last Rate Last Admin   fluorouracil (ADRUCIL) 4,350 mg in sodium chloride 0.9 % 63 mL chemo infusion  2,400 mg/m2 (Treatment Plan Recorded) Intravenous 1 day or 1 dose Malachy Mood, MD   4,350 mg at 09/01/23 1347    PHYSICAL EXAMINATION: ECOG PERFORMANCE STATUS: 1 - Symptomatic but completely ambulatory  Vitals:   09/01/23 1011  BP: 104/74  Pulse: 82  Resp: 16  Temp: 97.9 F (36.6 C)  SpO2: 99%   Wt Readings from Last 3 Encounters:  09/01/23 151 lb 4.8 oz (68.6 kg)  08/19/23 150 lb 11.2 oz (68.4 kg)  08/19/23 149 lb 4 oz (67.7 kg)     GENERAL:alert, no distress and comfortable SKIN: skin color, texture, turgor are normal, no rashes or significant lesions EYES: normal, Conjunctiva are pink and non-injected, sclera clear NECK: supple, thyroid normal size, non-tender, without nodularity LYMPH:  no palpable lymphadenopathy in the cervical, axillary  LUNGS: clear to auscultation and percussion with normal breathing effort HEART: regular rate & rhythm and no murmurs and no  lower extremity edema ABDOMEN:abdomen soft, non-tender and normal bowel sounds Musculoskeletal:no cyanosis of digits and no clubbing  NEURO: alert & oriented x 3 with fluent speech, no focal motor/sensory deficits  LABORATORY DATA:  I have reviewed the data as listed    Latest Ref Rng & Units 09/01/2023    9:46 AM 08/19/2023   12:06 PM 08/05/2023   12:36 PM  CBC  WBC 4.0 - 10.5 K/uL 4.3  5.2  8.8   Hemoglobin 13.0 - 17.0 g/dL 40.9  81.1  91.4   Hematocrit 39.0 - 52.0 % 32.4  32.1  33.1   Platelets 150 - 400 K/uL 398  337  360         Latest Ref Rng & Units 09/01/2023    9:46 AM 08/19/2023   12:06 PM 08/05/2023   12:36 PM  CMP  Glucose 70 - 99 mg/dL 782  92  956   BUN 6 - 20 mg/dL 11  9  14    Creatinine 0.61 - 1.24 mg/dL 2.13  0.86  5.78   Sodium 135 - 145 mmol/L 140  139  140   Potassium 3.5 - 5.1 mmol/L 3.7  3.4  3.4   Chloride 98 - 111 mmol/L 109  106  108   CO2 22 - 32 mmol/L 24  26  27    Calcium 8.9 - 10.3 mg/dL 8.7  8.9  9.1   Total Protein 6.5 - 8.1 g/dL 6.6  6.6  6.7   Total Bilirubin 0.0 - 1.2 mg/dL 0.5  0.9  0.7   Alkaline Phos 38 - 126 U/L 101  96  95   AST 15 - 41 U/L 16  18  15    ALT 0 - 44 U/L 16  13  12        RADIOGRAPHIC STUDIES: I have personally reviewed the radiological images as listed and agreed with the findings in the report. No results found.    No orders of the defined types were placed in this encounter.  All questions were answered. The patient knows to call the clinic with any problems, questions or concerns. No barriers to learning was detected. The total time spent in the appointment was 25 minutes.     Malachy Mood, MD 09/01/2023

## 2023-09-03 ENCOUNTER — Inpatient Hospital Stay: Payer: MEDICAID

## 2023-09-03 DIAGNOSIS — Z5111 Encounter for antineoplastic chemotherapy: Secondary | ICD-10-CM | POA: Diagnosis not present

## 2023-09-03 DIAGNOSIS — C186 Malignant neoplasm of descending colon: Secondary | ICD-10-CM

## 2023-09-03 MED ORDER — MAGNESIUM SULFATE 2 GM/50ML IV SOLN
2.0000 g | Freq: Once | INTRAVENOUS | Status: AC
  Administered 2023-09-03: 2 g via INTRAVENOUS

## 2023-09-03 MED ORDER — HEPARIN SOD (PORK) LOCK FLUSH 100 UNIT/ML IV SOLN
500.0000 [IU] | Freq: Once | INTRAVENOUS | Status: AC | PRN
  Administered 2023-09-03: 500 [IU]

## 2023-09-03 MED ORDER — SODIUM CHLORIDE 0.9% FLUSH
10.0000 mL | INTRAVENOUS | Status: DC | PRN
  Administered 2023-09-03: 10 mL

## 2023-09-03 NOTE — Patient Instructions (Signed)
 Hypomagnesemia Hypomagnesemia is a condition in which the level of magnesium in the blood is too low. Magnesium is a mineral that is found in many foods. It is used in many different processes in the body. Hypomagnesemia can affect every organ in the body. In severe cases, it can cause life-threatening problems. What are the causes? This condition may be caused by: Not getting enough magnesium in your diet or not having enough healthy foods to eat (malnutrition). Problems with magnesium absorption in the intestines. Dehydration. Excessive use of alcohol. Vomiting. Severe or long-term (chronic) diarrhea. Some medicines, including medicines that make you urinate more often (diuretics). Certain diseases, such as kidney disease, diabetes, celiac disease, and overactive thyroid. What are the signs or symptoms? Symptoms of this condition include: Loss of appetite, nausea, and vomiting. Involuntary shaking or trembling of a body part (tremor). Muscle weakness or tingling in the arms and legs. Sudden tightening of muscles (muscle spasms). Confusion. Psychiatric issues, such as: Depression and irritability. Psychosis. A feeling of fluttering of the heart (palpitations). Seizures. These symptoms are more severe if magnesium levels drop suddenly. How is this diagnosed? This condition may be diagnosed based on: Your symptoms and medical history. A physical exam. Blood and urine tests. How is this treated? Treatment depends on the cause and the severity of the condition. It may be treated by: Taking a magnesium supplement. This can be taken in pill form. If the condition is severe, magnesium is usually given through an IV. Making changes to your diet. You may be directed to eat foods that have a lot of magnesium, such as green leafy vegetables, peas, beans, and nuts. Not drinking alcohol. If you are struggling not to drink, ask your health care provider for help. Follow these instructions at  home: Eating and drinking     Make sure that your diet includes foods with magnesium. Foods that have a lot of magnesium in them include: Green leafy vegetables, such as spinach and broccoli. Beans and peas. Nuts and seeds, such as almonds and sunflower seeds. Whole grains, such as whole grain bread and fortified cereals. Drink fluids that contain salts and minerals (electrolytes), such as sports drinks, when you are active. Do not drink alcohol. General instructions Take over-the-counter and prescription medicines only as told by your health care provider. Take magnesium supplements as directed if your health care provider tells you to take them. Have your magnesium levels monitored as told by your health care provider. Keep all follow-up visits. This is important. Contact a health care provider if: You get worse instead of better. Your symptoms return. Get help right away if: You develop severe muscle weakness. You have trouble breathing. You feel that your heart is racing. These symptoms may represent a serious problem that is an emergency. Do not wait to see if the symptoms will go away. Get medical help right away. Call your local emergency services (911 in the [place] Do not drive yourself to the hospital. Summary Hypomagnesemia is a condition in which the level of magnesium in the blood is too low. Hypomagnesemia can affect every organ in the body. Treatment may include eating more foods that contain magnesium, taking magnesium supplements, and not drinking alcohol. Have your magnesium levels monitored as told by your health care provider. This information is not intended to replace advice given to you by your health care provider. Make sure you discuss any questions you have with your health care provider. Document Revised: 12/17/2020 Document Reviewed: 12/17/2020 Elsevier Patient Education  2024 [employer]

## 2023-09-06 ENCOUNTER — Other Ambulatory Visit: Payer: Self-pay

## 2023-09-06 DIAGNOSIS — C186 Malignant neoplasm of descending colon: Secondary | ICD-10-CM

## 2023-09-07 ENCOUNTER — Inpatient Hospital Stay: Payer: MEDICAID | Attending: Hematology

## 2023-09-07 VITALS — BP 106/79 | HR 101 | Temp 98.4°F | Resp 18

## 2023-09-07 DIAGNOSIS — Z79631 Long term (current) use of antimetabolite agent: Secondary | ICD-10-CM | POA: Insufficient documentation

## 2023-09-07 DIAGNOSIS — C772 Secondary and unspecified malignant neoplasm of intra-abdominal lymph nodes: Secondary | ICD-10-CM | POA: Diagnosis not present

## 2023-09-07 DIAGNOSIS — Z5111 Encounter for antineoplastic chemotherapy: Secondary | ICD-10-CM | POA: Diagnosis present

## 2023-09-07 DIAGNOSIS — C7801 Secondary malignant neoplasm of right lung: Secondary | ICD-10-CM | POA: Insufficient documentation

## 2023-09-07 DIAGNOSIS — Z79899 Other long term (current) drug therapy: Secondary | ICD-10-CM | POA: Diagnosis not present

## 2023-09-07 DIAGNOSIS — Z95828 Presence of other vascular implants and grafts: Secondary | ICD-10-CM

## 2023-09-07 DIAGNOSIS — C186 Malignant neoplasm of descending colon: Secondary | ICD-10-CM | POA: Insufficient documentation

## 2023-09-07 DIAGNOSIS — Z5112 Encounter for antineoplastic immunotherapy: Secondary | ICD-10-CM | POA: Diagnosis present

## 2023-09-07 DIAGNOSIS — C7802 Secondary malignant neoplasm of left lung: Secondary | ICD-10-CM | POA: Diagnosis not present

## 2023-09-07 DIAGNOSIS — C189 Malignant neoplasm of colon, unspecified: Secondary | ICD-10-CM

## 2023-09-07 LAB — CMP (CANCER CENTER ONLY)
ALT: 16 U/L (ref 0–44)
AST: 19 U/L (ref 15–41)
Albumin: 4 g/dL (ref 3.5–5.0)
Alkaline Phosphatase: 94 U/L (ref 38–126)
Anion gap: 7 (ref 5–15)
BUN: 9 mg/dL (ref 6–20)
CO2: 25 mmol/L (ref 22–32)
Calcium: 8.7 mg/dL — ABNORMAL LOW (ref 8.9–10.3)
Chloride: 108 mmol/L (ref 98–111)
Creatinine: 0.66 mg/dL (ref 0.61–1.24)
GFR, Estimated: >60 mL/min (ref >60)
Glucose, Bld: 91 mg/dL (ref 70–99)
Potassium: 3.6 mmol/L (ref 3.5–5.1)
Sodium: 140 mmol/L (ref 135–145)
Total Bilirubin: 0.7 mg/dL (ref 0.0–1.2)
Total Protein: 6.5 g/dL (ref 6.5–8.1)

## 2023-09-07 LAB — CBC WITH DIFFERENTIAL (CANCER CENTER ONLY)
Abs Immature Granulocytes: 0.02 10*3/uL (ref 0.00–0.07)
Basophils Absolute: 0 10*3/uL (ref 0.0–0.1)
Basophils Relative: 1 %
Eosinophils Absolute: 0.2 10*3/uL (ref 0.0–0.5)
Eosinophils Relative: 3 %
HCT: 31.3 % — ABNORMAL LOW (ref 39.0–52.0)
Hemoglobin: 10 g/dL — ABNORMAL LOW (ref 13.0–17.0)
Immature Granulocytes: 0 %
Lymphocytes Relative: 21 %
Lymphs Abs: 1.2 10*3/uL (ref 0.7–4.0)
MCH: 24.4 pg — ABNORMAL LOW (ref 26.0–34.0)
MCHC: 31.9 g/dL (ref 30.0–36.0)
MCV: 76.3 fL — ABNORMAL LOW (ref 80.0–100.0)
Monocytes Absolute: 0.3 10*3/uL (ref 0.1–1.0)
Monocytes Relative: 5 %
Neutro Abs: 3.9 10*3/uL (ref 1.7–7.7)
Neutrophils Relative %: 70 %
Platelet Count: 374 10*3/uL (ref 150–400)
RBC: 4.1 MIL/uL — ABNORMAL LOW (ref 4.22–5.81)
RDW: 21.4 % — ABNORMAL HIGH (ref 11.5–15.5)
WBC Count: 5.6 10*3/uL (ref 4.0–10.5)
nRBC: 0.7 % — ABNORMAL HIGH (ref 0.0–0.2)

## 2023-09-07 LAB — MAGNESIUM: Magnesium: 1 mg/dL — ABNORMAL LOW (ref 1.7–2.4)

## 2023-09-07 MED ORDER — HEPARIN SOD (PORK) LOCK FLUSH 100 UNIT/ML IV SOLN
500.0000 [IU] | Freq: Once | INTRAVENOUS | Status: AC
  Administered 2023-09-07: 500 [IU]

## 2023-09-07 MED ORDER — SODIUM CHLORIDE 0.9% FLUSH
10.0000 mL | Freq: Once | INTRAVENOUS | Status: AC
Start: 2023-09-07 — End: 2023-09-07
  Administered 2023-09-07: 10 mL

## 2023-09-16 ENCOUNTER — Inpatient Hospital Stay: Payer: MEDICAID

## 2023-09-16 ENCOUNTER — Encounter: Payer: Self-pay | Admitting: Hematology

## 2023-09-16 ENCOUNTER — Inpatient Hospital Stay (HOSPITAL_BASED_OUTPATIENT_CLINIC_OR_DEPARTMENT_OTHER): Payer: MEDICAID | Admitting: Hematology

## 2023-09-16 VITALS — HR 92

## 2023-09-16 VITALS — BP 99/71 | HR 109 | Temp 97.3°F | Resp 17 | Wt 150.5 lb

## 2023-09-16 DIAGNOSIS — C186 Malignant neoplasm of descending colon: Secondary | ICD-10-CM | POA: Diagnosis not present

## 2023-09-16 DIAGNOSIS — D500 Iron deficiency anemia secondary to blood loss (chronic): Secondary | ICD-10-CM

## 2023-09-16 DIAGNOSIS — Z95828 Presence of other vascular implants and grafts: Secondary | ICD-10-CM

## 2023-09-16 DIAGNOSIS — C7800 Secondary malignant neoplasm of unspecified lung: Secondary | ICD-10-CM

## 2023-09-16 DIAGNOSIS — Z5111 Encounter for antineoplastic chemotherapy: Secondary | ICD-10-CM | POA: Diagnosis not present

## 2023-09-16 LAB — FERRITIN: Ferritin: 6 ng/mL — ABNORMAL LOW (ref 24–336)

## 2023-09-16 LAB — CBC WITH DIFFERENTIAL (CANCER CENTER ONLY)
Abs Immature Granulocytes: 0.01 10*3/uL (ref 0.00–0.07)
Basophils Absolute: 0 10*3/uL (ref 0.0–0.1)
Basophils Relative: 1 %
Eosinophils Absolute: 0.1 10*3/uL (ref 0.0–0.5)
Eosinophils Relative: 4 %
HCT: 35.2 % — ABNORMAL LOW (ref 39.0–52.0)
Hemoglobin: 11.1 g/dL — ABNORMAL LOW (ref 13.0–17.0)
Immature Granulocytes: 0 %
Lymphocytes Relative: 27 %
Lymphs Abs: 1 10*3/uL (ref 0.7–4.0)
MCH: 24.4 pg — ABNORMAL LOW (ref 26.0–34.0)
MCHC: 31.5 g/dL (ref 30.0–36.0)
MCV: 77.4 fL — ABNORMAL LOW (ref 80.0–100.0)
Monocytes Absolute: 0.3 10*3/uL (ref 0.1–1.0)
Monocytes Relative: 8 %
Neutro Abs: 2.1 10*3/uL (ref 1.7–7.7)
Neutrophils Relative %: 60 %
Platelet Count: 323 10*3/uL (ref 150–400)
RBC: 4.55 MIL/uL (ref 4.22–5.81)
RDW: 21.5 % — ABNORMAL HIGH (ref 11.5–15.5)
WBC Count: 3.5 10*3/uL — ABNORMAL LOW (ref 4.0–10.5)
nRBC: 0 % (ref 0.0–0.2)

## 2023-09-16 LAB — CMP (CANCER CENTER ONLY)
ALT: 12 U/L (ref 0–44)
AST: 15 U/L (ref 15–41)
Albumin: 4.1 g/dL (ref 3.5–5.0)
Alkaline Phosphatase: 93 U/L (ref 38–126)
Anion gap: 6 (ref 5–15)
BUN: 8 mg/dL (ref 6–20)
CO2: 25 mmol/L (ref 22–32)
Calcium: 9.2 mg/dL (ref 8.9–10.3)
Chloride: 108 mmol/L (ref 98–111)
Creatinine: 0.69 mg/dL (ref 0.61–1.24)
GFR, Estimated: >60 mL/min (ref >60)
Glucose, Bld: 111 mg/dL — ABNORMAL HIGH (ref 70–99)
Potassium: 4.1 mmol/L (ref 3.5–5.1)
Sodium: 139 mmol/L (ref 135–145)
Total Bilirubin: 0.9 mg/dL (ref 0.0–1.2)
Total Protein: 6.9 g/dL (ref 6.5–8.1)

## 2023-09-16 LAB — MAGNESIUM: Magnesium: 1.1 mg/dL — ABNORMAL LOW (ref 1.7–2.4)

## 2023-09-16 MED ORDER — SODIUM CHLORIDE 0.9 % IV SOLN
Freq: Once | INTRAVENOUS | Status: AC
Start: 2023-09-16 — End: 2023-09-16

## 2023-09-16 MED ORDER — SODIUM CHLORIDE 0.9 % IV SOLN
6.0000 mg/kg | Freq: Once | INTRAVENOUS | Status: AC
  Administered 2023-09-16: 400 mg via INTRAVENOUS
  Filled 2023-09-16: qty 20

## 2023-09-16 MED ORDER — SODIUM CHLORIDE 0.9% FLUSH: 10.0000 mL | INTRAVENOUS | Status: DC | PRN

## 2023-09-16 MED ORDER — HEPARIN SOD (PORK) LOCK FLUSH 100 UNIT/ML IV SOLN: 500.0000 [IU] | Freq: Once | INTRAVENOUS | Status: DC | PRN

## 2023-09-16 MED ORDER — MAGNESIUM SULFATE 4 GM/100ML IV SOLN
4.0000 g | Freq: Once | INTRAVENOUS | Status: AC
  Administered 2023-09-16: 4 g via INTRAVENOUS
  Filled 2023-09-16: qty 100

## 2023-09-16 MED ORDER — SODIUM CHLORIDE 0.9 % IV SOLN
400.0000 mg/m2 | Freq: Once | INTRAVENOUS | Status: AC
  Administered 2023-09-16: 728 mg via INTRAVENOUS
  Filled 2023-09-16: qty 25

## 2023-09-16 MED ORDER — SODIUM CHLORIDE 0.9% FLUSH
10.0000 mL | Freq: Once | INTRAVENOUS | Status: AC
  Administered 2023-09-16: 10 mL

## 2023-09-16 MED ORDER — SODIUM CHLORIDE 0.9 % IV SOLN
2400.0000 mg/m2 | INTRAVENOUS | Status: DC
  Administered 2023-09-16: 4350 mg via INTRAVENOUS
  Filled 2023-09-16: qty 87

## 2023-09-16 NOTE — Progress Notes (Signed)
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 -we have changed his treatment to maintenance therapy with 5-fu and vectibix in late March 2024 -He is tolerating treatment very well, will continue. -due to metastasis in both lungs and retroperitoneal lymph nodes, he is not a candidate for surgery or local therapy  -restaging CT12/07/2023 showed stable disease overall, with minimal increase of tiny pulmonary nodule and axillary adenopathy.  Will continue current therapy.    Assessment and Plan    Metastatic Colon Cancer Follow-up for metastatic colon cancer. No new issues since the last treatment cycle. Tolerating current regimen well. Blood counts are stable. No dizziness or other symptoms. Currently on magnesium supplementation due to hypomagnesemia, managed with oral and intravenous administration. Discussed the importance of magnesium for cardiac health. Next CT scan scheduled for late  March to monitor disease progression. - Proceed with current treatment regimen - Administer intravenous magnesium today - Continue oral magnesium supplementation TID after meals - Order CT scan for late March - Schedule follow-up in two weeks  Hypomagnesemia Low magnesium levels managed with oral and intravenous supplementation. Currently taking oral magnesium TID after meals and receiving intravenous magnesium as needed. Discussed the importance of maintaining adequate magnesium levels for cardiac health. - Administer intravenous magnesium today - Continue oral magnesium supplementation TID after meals  General Health Maintenance Blood pressure noted to be on the low side but within normal limits. Advised to ensure adequate hydration, especially in the morning. - Ensure adequate hydration, especially in the morning  Plan -Patient is clinically doing well.  Lab reviewed, adequate for treatment, will proceed chemo today and continue every 2 weeks - Schedule follow-up in two weeks - Order CT scan for late March.         SUMMARY OF ONCOLOGIC HISTORY: Oncology History Overview Note   Cancer Staging  Cancer of left colon [facility]) Staging form: Colon and Rectum, AJCC 8th Edition - Pathologic stage from 08/08/2021: Stage IIIB (pT3, pN1a, cM0) - Signed by Malachy Mood, MD on 08/26/2021    Cancer of left colon (HCC)  04/01/2020 Imaging   IMPRESSION: 1. Gallbladder decompressed bowel also partially calcified gallstones. No pericholecystic inflammation though if there is persisting clinical concern for cholecystitis right upper quadrant ultrasound could be obtained. 2. Circumferential thickening of the distal thoracic esophagus. Could reflect features of esophagitis. Correlate with clinical symptoms and consider endoscopy as clinically warranted. 3. Additional segmental thickening of the mid to distal sigmoid with focal narrowing. No acute surrounding inflammation or resulting obstruction.  Findings are nonspecific, and could reflect  sequela of prior inflammation/infection. However, recommend correlation with colonoscopy if not recently performed. 4. Mild circumferential bladder wall thickening and indentation of the bladder base by an enlarged prostate. Possibly sequela of chronic outlet obstruction though could correlate with urinalysis to exclude cystitis. 5. Aortic Atherosclerosis (ICD10-I70.0).   08/06/2021 Imaging   IMPRESSION: Sigmoid colonic perforation with small free intraperitoneal gas and infiltration of the mesenteric and omental fat in keeping with changes of peritonitis.   Long segment inflammatory stranding of the sigmoid colon in keeping with a severe infectious or inflammatory colitis. This terminates an area of irregular mural thickening, infiltrative soft tissue within the colonic mesentery, and focal dystrophic calcification. This may represent a chronic inflammatory process, however, a perforated malignancy could appear similarly. There are 2 separate points of perforation which again raise the question of an underlying malignancy.   7.1 cm gas and fluid containing pericolonic abscess within the sigmoid mesentery.   Marked inflammatory change of the terminal ileum adjacent to the pericolonic abscess with resultant small bowel obstruction. Fluid within the distal esophagus likely relates to gastroesophageal reflux the setting of vomiting.   Aortic Atherosclerosis (ICD10-I70.0).   08/08/2021 Cancer Staging   Staging form: Colon and Rectum, AJCC 8th Edition - Pathologic stage from 08/08/2021: Stage IIIB (pT3, pN1a, cM0) - Signed by Malachy Mood, MD on 08/26/2021 Stage prefix: Initial diagnosis Total positive nodes: 1 Histologic grading system: 4 grade system Histologic grade (G): G2 Residual tumor (R): R0 - None   08/08/2021 Definitive Surgery   FINAL MICROSCOPIC DIAGNOSIS:   A. COLON, SIGMOID, PARTIAL COLECTOMY:  - Invasive moderately differentiated  adenocarcinoma.  - Metastatic carcinoma involving one of twelve lymph nodes (1/12).  - See oncology table below.   ADDENDUM:  Mismatch Repair Protein (IHC)  SUMMARY INTERPRETATION: NORMAL    08/14/2021 Imaging   EXAM: CT ABDOMEN AND PELVIS WITH CONTRAST  IMPRESSION: 1. Post recent sigmoid colectomy with left lower quadrant colostomy. Two small residual foci of air within the pelvic mesentery with mild adjacent thickening, but no abscess or drainable collection. Trace non organized free fluid and stranding in the pelvis. 2. Short segment of small bowel wall thickening and inflammation in the pelvis involving the distal ileum, likely reactive. 3. Dilated distal esophagus, stomach, and small bowel, without discrete transition point, favoring postoperative ileus. 4. Small bilateral pleural effusions and compressive atelectasis. 5. Heterogeneous partially enhancing 14 mm lymph node in the retroperitoneum at the aortoiliac bifurcation, not significantly changed from prior exam. Suspected additional lymph nodes in the anterior common iliac space, not significantly changed from prior exam. Recommend attention at follow-up. 6. Additional chronic findings as described.     08/15/2021 Imaging   EXAM: CT CHEST WITH CONTRAST  IMPRESSION: 1. No evidence of thoracic metastasis. 2. Bilateral small layering pleural effusions with passive atelectasis   08/26/2021 Initial Diagnosis   Cancer of left colon (HCC)   10/10/2021 - 12/12/2021 Chemotherapy   Patient is on Treatment Plan : COLORECTAL Xelox (Capeox) q21d     05/28/2022 - 05/28/2022 Chemotherapy   Patient is on Treatment Plan : COLORECTAL FOLFIRI + Bevacizumab q14d     05/28/2022 -  Chemotherapy   Patient is on Treatment Plan : COLORECTAL FOLFIRI + Panitumumab q14d     07/31/2022 Imaging    IMPRESSION: Decreased bilateral pulmonary metastases.   Stable mild abdominal lymphadenopathy.   No new or progressive metastatic disease  within the chest, abdomen, or pelvis.   01/18/2023 Imaging    IMPRESSION: 1. Status post Gertie Gowda  pouch sigmoid colon resection with left lower quadrant end colostomy and rectal stump. Unchanged, mild wall thickening of the decompressed distal colon at the ileostomy, as well as the rectal stump. 2. Unchanged small, treated metastatic pulmonary nodules. No new nodules. 3. Unchanged enlarged, coarsely calcified treated lower aortocaval metastatic lymph nodes. No new lymphadenopathy. 4. Coronary artery disease. 5. Cholelithiasis.   04/23/2023 Imaging   CT chest, abdomen, and pelvis with contrast  IMPRESSION: 1. Left lower lobe segmental pulmonary emboli, incidental finding. No evidence for right heart strain. 2. Stable trace bilateral pleural effusions. 3. Stable enlarged lower left paraesophageal lymph node. 4. Stable calcified retroperitoneal lymph nodes. No new enlarged lymph nodes in the chest, abdomen or pelvis. 5. Stable subcentimeter soft tissue nodules within the mesenteric fat along the inferior ostomy. 6. Left Bosniak I benign renal cyst measuring 4.6 cm. No follow-up imaging is recommended. JACR 2018 Feb; 264-273, Management of the Incidental Renal Mass on CT, [employer] 2021; 814-848, Bosniak Classification of Cystic Renal Masses, Version 2019.   07/15/2023 Imaging   CT chest abdomen and pelvis with contrast IMPRESSION: 1. Prior partial left hemicolectomy with end colostomy in the left anterior abdominal wall. Unchanged mild wall thickening of the decompressed distal colon at the ileostomy as well as the rectal stump/Hartmann's pouch. 2. Stable enlarged left lower paraesophageal lymph node. 3. Increased size of the treated pulmonary nodule in the lingula now measuring 6 mm previously 4 mm with increased in size favored artifactual given motion degrading examination of the lung bases. Suggest attention on follow-up imaging 4. Unchanged size of the treated pulmonary nodule in  the right middle lobe measuring 4 mm. 5. Increased size of prominent bilateral axillary lymph nodes measuring up to 7 mm in short axis, nonspecific. Suggest attention on follow-up imaging 6. No convincing evidence of new or progressive disease in the chest, abdomen or pelvis. 7.  Aortic Atherosclerosis (ICD10-I70.0).        Discussed the use of AI scribe software for clinical note transcription with the patient, who gave verbal consent to proceed.  History of Present Illness   A 58 year old patient with a history of metastatic colon cancer presents for a follow-up visit after the last cycle of treatment. The patient reports no new symptoms or problems since the last treatment. The patient's blood pressure is on the lower side of normal, but he does not feel dizzy. The patient has been taking magnesium twice a day and a blood thinner. The patient's blood counts are reported to be fine.         All other systems were reviewed with the patient and are negative.  MEDICAL HISTORY:  Past Medical History:  Diagnosis Date   Cancer (HCC)    Schizophrenia [facility])     SURGICAL HISTORY: Past Surgical History:  Procedure Laterality Date   BRONCHIAL BIOPSY  05/11/2022   Procedure: BRONCHIAL BIOPSIES;  Surgeon: Leslye Peer, MD;  Location: [facility] ENDOSCOPY;  Service: Pulmonary;;   BRONCHIAL BRUSHINGS  05/11/2022   Procedure: BRONCHIAL BRUSHINGS;  Surgeon: Leslye Peer, MD;  Location: [facility] ENDOSCOPY;  Service: Pulmonary;;   BRONCHIAL NEEDLE ASPIRATION BIOPSY  05/11/2022   Procedure: BRONCHIAL NEEDLE ASPIRATION BIOPSIES;  Surgeon: Leslye Peer, MD;  Location: [facility] ENDOSCOPY;  Service: Pulmonary;;   BRONCHIAL WASHINGS  05/11/2022   Procedure: BRONCHIAL WASHINGS;  Surgeon: Leslye Peer, MD;  Location: [facility] ENDOSCOPY;  Service: Pulmonary;;   IR IMAGING GUIDED PORT INSERTION  05/25/2022   LAPAROTOMY N/A 08/08/2021   Procedure:  EXPLORATORY LAPAROTOMY;  Surgeon: Berna Bue, MD;  Location: [facility] OR;   Service: General;  Laterality: N/A;   PARTIAL COLECTOMY N/A 08/08/2021   Procedure: PARTIAL COLECTOMY WITH COLOSTOMY;  Surgeon: Berna Bue, MD;  Location: [facility] OR;  Service: General;  Laterality: N/A;   VIDEO BRONCHOSCOPY WITH RADIAL ENDOBRONCHIAL ULTRASOUND  05/11/2022   Procedure: VIDEO BRONCHOSCOPY WITH RADIAL ENDOBRONCHIAL ULTRASOUND;  Surgeon: Leslye Peer, MD;  Location: [facility] ENDOSCOPY;  Service: Pulmonary;;    I have reviewed the social history and family history with the patient and they are unchanged from previous note.  ALLERGIES:  has no known allergies.  MEDICATIONS:  Current Outpatient Medications  Medication Sig Dispense Refill   acetaminophen (TYLENOL) 500 MG tablet Take 2 tablets (1,000 mg total) by mouth every 6 (six) hours as needed for mild pain or moderate pain.  0   apixaban (ELIQUIS) 5 MG TABS tablet Take 1 tablet (5 mg total) by mouth 2 (two) times daily. 60 tablet 2   aspirin EC 81 MG tablet Take 81 mg by mouth daily as needed (for pain or headaches).      benztropine (COGENTIN) 1 MG tablet Take 1 mg at bedtime by mouth.     clindamycin (CLINDAGEL) 1 % gel Apply topically 2 (two) times daily. To skin rash on face and upper body 30 g 0   ferrous sulfate 325 (65 FE) MG EC tablet Take 1 tablet (325 mg total) by mouth daily. 30 tablet 3   hydrocortisone cream 1 % Apply 1 Application topically 2 (two) times daily as needed for itching. For rash 30 g 2   loperamide (IMODIUM) 2 MG capsule Take 1 capsule (2 mg total) by mouth as needed for diarrhea or loose stools. 30 capsule 0   magnesium oxide (MAG-OX) 400 (240 Mg) MG tablet Take 1 tablet (400 mg total) by mouth daily. 30 tablet 2   Multiple Vitamin (MULTIVITAMIN WITH MINERALS) TABS tablet Take 1 tablet by mouth daily.     ondansetron (ZOFRAN) 8 MG tablet Take 1 tablet (8 mg total) by mouth every 8 (eight) hours as needed for nausea or vomiting. 30 tablet 0   paliperidone (INVEGA SUSTENNA) 156 MG/ML SUSP injection Inject  156 mg every 30 (thirty) days into the muscle.     polyethylene glycol (MIRALAX / GLYCOLAX) 17 g packet Take 17 g by mouth daily as needed for mild constipation or moderate constipation.  0   potassium chloride SA (KLOR-CON M) 20 MEQ tablet Take 1 tablet (20 mEq total) by mouth daily. 30 tablet 2   prochlorperazine (COMPAZINE) 10 MG tablet TAKE 1 TABLET(10 MG) BY MOUTH EVERY 6 HOURS AS NEEDED FOR NAUSEA OR VOMITING 30 tablet 0   No current facility-administered medications for this visit.   Facility-Administered Medications Ordered in Other Visits  Medication Dose Route Frequency Provider Last Rate Last Admin   fluorouracil (ADRUCIL) 4,350 mg in sodium chloride 0.9 % 63 mL chemo infusion  2,400 mg/m2 (Treatment Plan Recorded) Intravenous 1 day or 1 dose Malachy Mood, MD       heparin lock flush 100 unit/mL  500 Units Intracatheter Once PRN Malachy Mood, MD       leucovorin 728 mg in sodium chloride 0.9 % 250 mL infusion  400 mg/m2 (Treatment Plan Recorded) Intravenous Once Malachy Mood, MD       magnesium sulfate IVPB 4 g 100 mL  4 g Intravenous Once Malachy Mood, MD 50 mL/hr at 09/16/23 1022 4 g at  09/16/23 1022   panitumumab (VECTIBIX) 400 mg in sodium chloride 0.9 % 100 mL chemo infusion  6 mg/kg (Treatment Plan Recorded) Intravenous Once Malachy Mood, MD       sodium chloride flush (NS) 0.9 % injection 10 mL  10 mL Intracatheter PRN Malachy Mood, MD        PHYSICAL EXAMINATION: ECOG PERFORMANCE STATUS: 0 - Asymptomatic  Vitals:   09/16/23 0929  BP: 99/71  Pulse: (!) 109  Resp: 17  Temp: (!) 97.3 F (36.3 C)  SpO2: 100%   Wt Readings from Last 3 Encounters:  09/16/23 150 lb 8 oz (68.3 kg)  09/01/23 151 lb 4.8 oz (68.6 kg)  08/19/23 150 lb 11.2 oz (68.4 kg)     GENERAL:alert, no distress and comfortable SKIN: skin color, texture, turgor are normal, a few scattered skin rash on his face EYES: normal, Conjunctiva are pink and non-injected, sclera clear NECK: supple, thyroid normal size,  non-tender, without nodularity LYMPH:  no palpable lymphadenopathy in the cervical, axillary  LUNGS: clear to auscultation and percussion with normal breathing effort HEART: regular rate & rhythm and no murmurs and no lower extremity edema ABDOMEN:abdomen soft, non-tender and normal bowel sounds Musculoskeletal:no cyanosis of digits and no clubbing  NEURO: alert & oriented x 3 with fluent speech, no focal motor/sensory deficits    LABORATORY DATA:  I have reviewed the data as listed    Latest Ref Rng & Units 09/16/2023    9:07 AM 09/07/2023    3:08 PM 09/01/2023    9:46 AM  CBC  WBC 4.0 - 10.5 K/uL 3.5  5.6  4.3   Hemoglobin 13.0 - 17.0 g/dL 40.9  81.1  91.4   Hematocrit 39.0 - 52.0 % 35.2  31.3  32.4   Platelets 150 - 400 K/uL 323  374  398         Latest Ref Rng & Units 09/16/2023    9:07 AM 09/07/2023    3:08 PM 09/01/2023    9:46 AM  CMP  Glucose 70 - 99 mg/dL 782  91  956   BUN 6 - 20 mg/dL 8  9  11    Creatinine 0.61 - 1.24 mg/dL 2.13  0.86  5.78   Sodium 135 - 145 mmol/L 139  140  140   Potassium 3.5 - 5.1 mmol/L 4.1  3.6  3.7   Chloride 98 - 111 mmol/L 108  108  109   CO2 22 - 32 mmol/L 25  25  24    Calcium 8.9 - 10.3 mg/dL 9.2  8.7  8.7   Total Protein 6.5 - 8.1 g/dL 6.9  6.5  6.6   Total Bilirubin 0.0 - 1.2 mg/dL 0.9  0.7  0.5   Alkaline Phos 38 - 126 U/L 93  94  101   AST 15 - 41 U/L 15  19  16    ALT 0 - 44 U/L 12  16  16        RADIOGRAPHIC STUDIES: I have personally reviewed the radiological images as listed and agreed with the findings in the report. No results found.    Orders Placed This Encounter  Procedures   CT CHEST ABDOMEN PELVIS W CONTRAST    Standing Status:   Future    Expected Date:   10/21/2023    Expiration Date:   09/15/2024    If indicated for the ordered procedure, I authorize the administration of contrast media per Radiology protocol:   Yes  Does the patient have a contrast media/X-ray dye allergy?:   No    Preferred imaging  location?:   [facility]    If indicated for the ordered procedure, I authorize the administration of oral contrast media per Radiology protocol:   Yes   All questions were answered. The patient knows to call the clinic with any problems, questions or concerns. No barriers to learning was detected. The total time spent in the appointment was 25 minutes.     Malachy Mood, MD 09/16/2023

## 2023-09-16 NOTE — Patient Instructions (Signed)
[facility] - A DEPT OF [facility]  Discharge Instructions: Thank you for choosing [facility] to provide your oncology and hematology care.   If you have a lab appointment with the Cancer Center, please go directly to the Cancer Center and check in at the registration area.   Wear comfortable clothing and clothing appropriate for easy access to any Portacath or PICC line.   We strive to give you quality time with your provider. You may need to reschedule your appointment if you arrive late (15 or more minutes).  Arriving late affects you and other patients whose appointments are after yours.  Also, if you miss three or more appointments without notifying the office, you may be dismissed from the clinic at the provider's discretion.      For prescription refill requests, have your pharmacy contact our office and allow 72 hours for refills to be completed.    Today you received the following chemotherapy and/or immunotherapy agents: Vectibix, Leucovorin, 5FU      To help prevent nausea and vomiting after your treatment, we encourage you to take your nausea medication as directed.  BELOW ARE SYMPTOMS THAT SHOULD BE REPORTED IMMEDIATELY: *FEVER GREATER THAN 100.4 F (38 C) OR HIGHER *CHILLS OR SWEATING *NAUSEA AND VOMITING THAT IS NOT CONTROLLED WITH YOUR NAUSEA MEDICATION *UNUSUAL SHORTNESS OF BREATH *UNUSUAL BRUISING OR BLEEDING *URINARY PROBLEMS (pain or burning when urinating, or frequent urination) *BOWEL PROBLEMS (unusual diarrhea, constipation, pain near the anus) TENDERNESS IN MOUTH AND THROAT WITH OR WITHOUT PRESENCE OF ULCERS (sore throat, sores in mouth, or a toothache) UNUSUAL RASH, SWELLING OR PAIN  UNUSUAL VAGINAL DISCHARGE OR ITCHING   Items with * indicate a potential emergency and should be followed up as soon as possible or go to the Emergency Department if any problems should occur.  Please show the CHEMOTHERAPY ALERT CARD  or IMMUNOTHERAPY ALERT CARD at check-in to the Emergency Department and triage nurse.  Should you have questions after your visit or need to cancel or reschedule your appointment, please contact [facility] - A DEPT OF [facility]  Dept: [phone]  and follow the prompts.  Office hours are 8:00 a.m. to 4:30 p.m. Monday - Friday. Please note that voicemails left after 4:00 p.m. may not be returned until the following business day.  We are closed weekends and major holidays. You have access to a nurse at all times for urgent questions. Please call the main number to the clinic Dept: [phone] and follow the prompts.   For any non-urgent questions, you may also contact your provider using MyChart. We now offer e-Visits for anyone 43 and older to request care online for non-urgent symptoms. For details visit [email or web address].   Also download the MyChart app! Go to the app store, search "MyChart", open the app, select [facility], and log in with your MyChart username and password.  Hypomagnesemia Hypomagnesemia is a condition in which the level of magnesium in the blood is too low. Magnesium is a mineral that is found in many foods. It is used in many different processes in the body. Hypomagnesemia can affect every organ in the body. In severe cases, it can cause life-threatening problems. What are the causes? This condition may be caused by: Not getting enough magnesium in your diet or not having enough healthy foods to eat (malnutrition). Problems with magnesium absorption in the intestines. Dehydration. Excessive use of  alcohol. Vomiting. Severe or long-term (chronic) diarrhea. Some medicines, including medicines that make you urinate more often (diuretics). Certain diseases, such as kidney disease, diabetes, celiac disease, and overactive thyroid. What are the signs or symptoms? Symptoms of this condition include: Loss of appetite, nausea, and  vomiting. Involuntary shaking or trembling of a body part (tremor). Muscle weakness or tingling in the arms and legs. Sudden tightening of muscles (muscle spasms). Confusion. Psychiatric issues, such as: Depression and irritability. Psychosis. A feeling of fluttering of the heart (palpitations). Seizures. These symptoms are more severe if magnesium levels drop suddenly. How is this diagnosed? This condition may be diagnosed based on: Your symptoms and medical history. A physical exam. Blood and urine tests. How is this treated? Treatment depends on the cause and the severity of the condition. It may be treated by: Taking a magnesium supplement. This can be taken in pill form. If the condition is severe, magnesium is usually given through an IV. Making changes to your diet. You may be directed to eat foods that have a lot of magnesium, such as green leafy vegetables, peas, beans, and nuts. Not drinking alcohol. If you are struggling not to drink, ask your health care provider for help. Follow these instructions at home: Eating and drinking     Make sure that your diet includes foods with magnesium. Foods that have a lot of magnesium in them include: Green leafy vegetables, such as spinach and broccoli. Beans and peas. Nuts and seeds, such as almonds and sunflower seeds. Whole grains, such as whole grain bread and fortified cereals. Drink fluids that contain salts and minerals (electrolytes), such as sports drinks, when you are active. Do not drink alcohol. General instructions Take over-the-counter and prescription medicines only as told by your health care provider. Take magnesium supplements as directed if your health care provider tells you to take them. Have your magnesium levels monitored as told by your health care provider. Keep all follow-up visits. This is important. Contact a health care provider if: You get worse instead of better. Your symptoms return. Get help  right away if: You develop severe muscle weakness. You have trouble breathing. You feel that your heart is racing. These symptoms may represent a serious problem that is an emergency. Do not wait to see if the symptoms will go away. Get medical help right away. Call your local emergency services (911 in the [place] Do not drive yourself to the hospital. Summary Hypomagnesemia is a condition in which the level of magnesium in the blood is too low. Hypomagnesemia can affect every organ in the body. Treatment may include eating more foods that contain magnesium, taking magnesium supplements, and not drinking alcohol. Have your magnesium levels monitored as told by your health care provider. This information is not intended to replace advice given to you by your health care provider. Make sure you discuss any questions you have with your health care provider. Document Revised: 12/17/2020 Document Reviewed: 12/17/2020 Elsevier Patient Education  2024 [employer].

## 2023-09-16 NOTE — Assessment & Plan Note (Signed)
stage IIIB p(T3, N1aM0), MSS, KRAS/NRAS/BRAF wild type, lung and node metastasis in 04/2022  -Initially diagnosed in 08/2021 -he completed 4 cycles adjuvant CAPOX 10/10/21 - 12/25/21 (though he somehow was still taking Xeloda through ~02/09/22 due to misunderstandings).  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 -we have changed his treatment to maintenance therapy with 5-fu and vectibix in late March 2024 -He is tolerating treatment very well, will continue. -due to metastasis in both lungs and retroperitoneal lymph nodes, he is not a candidate for surgery or local therapy  -restaging CT12/07/2023 showed stable disease overall, with minimal increase of tiny pulmonary nodule and axillary adenopathy.  Will continue current therapy.

## 2023-09-17 ENCOUNTER — Encounter: Payer: Self-pay | Admitting: Hematology

## 2023-09-17 NOTE — Addendum Note (Signed)
Addended by: Malachy Mood on: 09/17/2023 10:24 AM   Modules accepted: Orders

## 2023-09-18 ENCOUNTER — Inpatient Hospital Stay: Payer: MEDICAID

## 2023-09-18 VITALS — BP 99/76 | HR 103 | Temp 97.2°F | Resp 19

## 2023-09-18 DIAGNOSIS — Z5111 Encounter for antineoplastic chemotherapy: Secondary | ICD-10-CM | POA: Diagnosis not present

## 2023-09-18 DIAGNOSIS — C186 Malignant neoplasm of descending colon: Secondary | ICD-10-CM

## 2023-09-18 MED ORDER — SODIUM CHLORIDE 0.9% FLUSH
10.0000 mL | INTRAVENOUS | Status: DC | PRN
  Administered 2023-09-18: 10 mL

## 2023-09-18 MED ORDER — HEPARIN SOD (PORK) LOCK FLUSH 100 UNIT/ML IV SOLN
500.0000 [IU] | Freq: Once | INTRAVENOUS | Status: AC | PRN
Start: 2023-09-18 — End: 2023-09-18
  Administered 2023-09-18: 500 [IU]

## 2023-09-20 ENCOUNTER — Telehealth: Payer: Self-pay | Admitting: Hematology

## 2023-09-20 NOTE — Telephone Encounter (Signed)
Left patient's mother a voicemail in regards to added appointments, left callback number for scheduling line as well

## 2023-09-27 ENCOUNTER — Ambulatory Visit (HOSPITAL_COMMUNITY): Payer: MEDICAID

## 2023-09-29 ENCOUNTER — Other Ambulatory Visit: Payer: Self-pay | Admitting: Nurse Practitioner

## 2023-09-29 DIAGNOSIS — D649 Anemia, unspecified: Secondary | ICD-10-CM | POA: Insufficient documentation

## 2023-09-30 ENCOUNTER — Encounter: Payer: Self-pay | Admitting: Hematology

## 2023-09-30 ENCOUNTER — Inpatient Hospital Stay: Payer: MEDICAID

## 2023-09-30 ENCOUNTER — Inpatient Hospital Stay (HOSPITAL_BASED_OUTPATIENT_CLINIC_OR_DEPARTMENT_OTHER): Payer: MEDICAID | Admitting: Hematology

## 2023-09-30 VITALS — BP 131/74 | HR 94 | Temp 98.5°F | Resp 18 | Wt 149.8 lb

## 2023-09-30 DIAGNOSIS — C186 Malignant neoplasm of descending colon: Secondary | ICD-10-CM | POA: Diagnosis not present

## 2023-09-30 DIAGNOSIS — Z5111 Encounter for antineoplastic chemotherapy: Secondary | ICD-10-CM | POA: Diagnosis not present

## 2023-09-30 DIAGNOSIS — C7800 Secondary malignant neoplasm of unspecified lung: Secondary | ICD-10-CM

## 2023-09-30 DIAGNOSIS — Z95828 Presence of other vascular implants and grafts: Secondary | ICD-10-CM

## 2023-09-30 LAB — CBC WITH DIFFERENTIAL (CANCER CENTER ONLY)
Abs Immature Granulocytes: 0.01 10*3/uL (ref 0.00–0.07)
Basophils Absolute: 0 10*3/uL (ref 0.0–0.1)
Basophils Relative: 1 %
Eosinophils Absolute: 0.1 10*3/uL (ref 0.0–0.5)
Eosinophils Relative: 2 %
HCT: 31.5 % — ABNORMAL LOW (ref 39.0–52.0)
Hemoglobin: 10 g/dL — ABNORMAL LOW (ref 13.0–17.0)
Immature Granulocytes: 0 %
Lymphocytes Relative: 19 %
Lymphs Abs: 1 10*3/uL (ref 0.7–4.0)
MCH: 24.3 pg — ABNORMAL LOW (ref 26.0–34.0)
MCHC: 31.7 g/dL (ref 30.0–36.0)
MCV: 76.5 fL — ABNORMAL LOW (ref 80.0–100.0)
Monocytes Absolute: 0.5 10*3/uL (ref 0.1–1.0)
Monocytes Relative: 9 %
Neutro Abs: 3.7 10*3/uL (ref 1.7–7.7)
Neutrophils Relative %: 69 %
Platelet Count: 318 10*3/uL (ref 150–400)
RBC: 4.12 MIL/uL — ABNORMAL LOW (ref 4.22–5.81)
RDW: 21.3 % — ABNORMAL HIGH (ref 11.5–15.5)
WBC Count: 5.2 10*3/uL (ref 4.0–10.5)
nRBC: 0 % (ref 0.0–0.2)

## 2023-09-30 LAB — CMP (CANCER CENTER ONLY)
ALT: 11 U/L (ref 0–44)
AST: 15 U/L (ref 15–41)
Albumin: 3.9 g/dL (ref 3.5–5.0)
Alkaline Phosphatase: 86 U/L (ref 38–126)
Anion gap: 4 — ABNORMAL LOW (ref 5–15)
BUN: 13 mg/dL (ref 6–20)
CO2: 25 mmol/L (ref 22–32)
Calcium: 8.7 mg/dL — ABNORMAL LOW (ref 8.9–10.3)
Chloride: 108 mmol/L (ref 98–111)
Creatinine: 0.67 mg/dL (ref 0.61–1.24)
GFR, Estimated: >60 mL/min (ref >60)
Glucose, Bld: 88 mg/dL (ref 70–99)
Potassium: 3.6 mmol/L (ref 3.5–5.1)
Sodium: 137 mmol/L (ref 135–145)
Total Bilirubin: 0.5 mg/dL (ref 0.0–1.2)
Total Protein: 6.6 g/dL (ref 6.5–8.1)

## 2023-09-30 LAB — MAGNESIUM: Magnesium: 1.2 mg/dL — ABNORMAL LOW (ref 1.7–2.4)

## 2023-09-30 MED ORDER — SODIUM CHLORIDE 0.9 % IV SOLN
2400.0000 mg/m2 | INTRAVENOUS | Status: DC
  Administered 2023-09-30: 4350 mg via INTRAVENOUS
  Filled 2023-09-30: qty 87

## 2023-09-30 MED ORDER — SODIUM CHLORIDE 0.9 % IV SOLN: Freq: Once | INTRAVENOUS | Status: AC

## 2023-09-30 MED ORDER — SODIUM CHLORIDE 0.9 % IV SOLN: 4.0000 g | Freq: Once | INTRAVENOUS | Status: DC

## 2023-09-30 MED ORDER — SODIUM CHLORIDE 0.9 % IV SOLN: INTRAVENOUS | Status: DC

## 2023-09-30 MED ORDER — SODIUM CHLORIDE 0.9 % IV SOLN
1000.0000 mg | Freq: Once | INTRAVENOUS | Status: AC
  Administered 2023-09-30: 1000 mg via INTRAVENOUS
  Filled 2023-09-30: qty 10

## 2023-09-30 MED ORDER — CETIRIZINE HCL 10 MG/ML IV SOLN
10.0000 mg | Freq: Once | INTRAVENOUS | Status: AC
  Administered 2023-09-30: 10 mg via INTRAVENOUS
  Filled 2023-09-30: qty 1

## 2023-09-30 MED ORDER — EPINEPHRINE 0.3 MG/0.3ML IJ SOAJ: 0.3000 mg | Freq: Once | INTRAMUSCULAR | Status: DC | PRN

## 2023-09-30 MED ORDER — DIPHENHYDRAMINE HCL 50 MG/ML IJ SOLN: 50.0000 mg | Freq: Once | INTRAMUSCULAR | Status: DC | PRN

## 2023-09-30 MED ORDER — SODIUM CHLORIDE 0.9% FLUSH
10.0000 mL | Freq: Once | INTRAVENOUS | Status: AC
Start: 2023-09-30 — End: 2023-09-30
  Administered 2023-09-30: 10 mL

## 2023-09-30 MED ORDER — SODIUM CHLORIDE 0.9% FLUSH: 10.0000 mL | INTRAVENOUS | Status: DC | PRN

## 2023-09-30 MED ORDER — METHYLPREDNISOLONE SODIUM SUCC 125 MG IJ SOLR
125.0000 mg | Freq: Once | INTRAMUSCULAR | Status: DC | PRN
Start: 2023-09-30 — End: 2023-09-30

## 2023-09-30 MED ORDER — FAMOTIDINE IN NACL 20-0.9 MG/50ML-% IV SOLN: 20.0000 mg | Freq: Once | INTRAVENOUS | Status: DC | PRN

## 2023-09-30 MED ORDER — SODIUM CHLORIDE 0.9 % IV SOLN
6.0000 mg/kg | Freq: Once | INTRAVENOUS | Status: AC
  Administered 2023-09-30: 400 mg via INTRAVENOUS
  Filled 2023-09-30: qty 20

## 2023-09-30 MED ORDER — MAGNESIUM SULFATE 4 GM/100ML IV SOLN
4.0000 g | Freq: Once | INTRAVENOUS | Status: AC
  Administered 2023-09-30: 4 g via INTRAVENOUS
  Filled 2023-09-30: qty 100

## 2023-09-30 MED ORDER — SODIUM CHLORIDE 0.9 % IV SOLN
Freq: Once | INTRAVENOUS | Status: DC | PRN
Start: 2023-09-30 — End: 2023-09-30

## 2023-09-30 MED ORDER — ALBUTEROL SULFATE HFA 108 (90 BASE) MCG/ACT IN AERS: 2.0000 | INHALATION_SPRAY | Freq: Once | RESPIRATORY_TRACT | Status: DC | PRN

## 2023-09-30 MED ORDER — HEPARIN SOD (PORK) LOCK FLUSH 100 UNIT/ML IV SOLN
500.0000 [IU] | Freq: Once | INTRAVENOUS | Status: DC | PRN
Start: 2023-09-30 — End: 2023-09-30

## 2023-09-30 MED ORDER — SODIUM CHLORIDE 0.9 % IV SOLN
400.0000 mg/m2 | Freq: Once | INTRAVENOUS | Status: AC
  Administered 2023-09-30: 728 mg via INTRAVENOUS
  Filled 2023-09-30: qty 36.4

## 2023-09-30 NOTE — Progress Notes (Signed)
 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 -we have changed his treatment to maintenance therapy with 5-fu and vectibix in late March 2024 -He is tolerating treatment very well, will continue. -due to metastasis in both lungs and retroperitoneal lymph nodes, he is not a candidate for surgery or local therapy  -restaging CT12/07/2023 showed stable disease overall, with minimal increase of tiny pulmonary nodule and axillary adenopathy.  Will continue current therapy.   Assessment and Plan    Metastatic Colon Cancer 58 year old male with metastatic colon cancer undergoing chemotherapy. Missed a scheduled CT scan due to transportation issues; rescheduled for March 5th. No gastrointestinal symptoms or rash reported. Eating well. Discussed the importance of the CT scan for monitoring disease progression and adjusting treatment. Informed consent obtained for chemotherapy and  high-dose IV iron infusion, including potential side effects and benefits. - Administer chemotherapy as scheduled - Administer high-dose IV iron infusion today - Order CT scan on March 5th - Follow up in two weeks to review CT scan results  Anemia Anemia secondary to metastatic colon cancer and chemotherapy. Low iron levels contributing to anemia and decreased energy levels. High-dose IV iron infusion planned to improve anemia and energy levels. - Administer high-dose IV iron infusion today.     Plan -Lab reviewed, adequate for treatment, will proceed chemotherapy today and continue every 2 weeks -He is scheduled for restaging CT scan next week -Follow-up in 2 weeks to review CT results and treatment    SUMMARY OF ONCOLOGIC HISTORY: Oncology History Overview Note   Cancer Staging  Cancer of left colon [facility]) Staging form: Colon and Rectum, AJCC 8th Edition - Pathologic stage from 08/08/2021: Stage IIIB (pT3, pN1a, cM0) - Signed by Malachy Mood, MD on 08/26/2021    Cancer of left colon (HCC)  04/01/2020 Imaging   IMPRESSION: 1. Gallbladder decompressed bowel also partially calcified gallstones. No pericholecystic inflammation though if there is persisting clinical concern for cholecystitis right upper quadrant ultrasound could be obtained. 2. Circumferential thickening of the distal thoracic esophagus. Could reflect features of esophagitis. Correlate with clinical symptoms and consider endoscopy as clinically warranted. 3. Additional segmental thickening of the mid to distal sigmoid with focal narrowing. No acute surrounding inflammation or resulting obstruction. Findings are nonspecific, and could reflect sequela of prior inflammation/infection. However, recommend correlation with colonoscopy if not recently performed. 4. Mild circumferential bladder wall thickening and indentation of the bladder base by an enlarged prostate. Possibly sequela of chronic outlet obstruction though  could correlate with urinalysis to exclude cystitis. 5. Aortic Atherosclerosis (  ICD10-I70.0).   08/06/2021 Imaging   IMPRESSION: Sigmoid colonic perforation with small free intraperitoneal gas and infiltration of the mesenteric and omental fat in keeping with changes of peritonitis.   Long segment inflammatory stranding of the sigmoid colon in keeping with a severe infectious or inflammatory colitis. This terminates an area of irregular mural thickening, infiltrative soft tissue within the colonic mesentery, and focal dystrophic calcification. This may represent a chronic inflammatory process, however, a perforated malignancy could appear similarly. There are 2 separate points of perforation which again raise the question of an underlying malignancy.   7.1 cm gas and fluid containing pericolonic abscess within the sigmoid mesentery.   Marked inflammatory change of the terminal ileum adjacent to the pericolonic abscess with resultant small bowel obstruction. Fluid within the distal esophagus likely relates to gastroesophageal reflux the setting of vomiting.   Aortic Atherosclerosis (ICD10-I70.0).   08/08/2021 Cancer Staging   Staging form: Colon and Rectum, AJCC 8th Edition - Pathologic stage from 08/08/2021: Stage IIIB (pT3, pN1a, cM0) - Signed by Malachy Mood, MD on 08/26/2021 Stage prefix: Initial diagnosis Total positive nodes: 1 Histologic grading system: 4 grade system Histologic grade (G): G2 Residual tumor (R): R0 - None   08/08/2021 Definitive Surgery   FINAL MICROSCOPIC DIAGNOSIS:   A. COLON, SIGMOID, PARTIAL COLECTOMY:  - Invasive moderately differentiated adenocarcinoma.  - Metastatic carcinoma involving one of twelve lymph nodes (1/12).  - See oncology table below.   ADDENDUM:  Mismatch Repair Protein (IHC)  SUMMARY INTERPRETATION: NORMAL    08/14/2021 Imaging   EXAM: CT ABDOMEN AND PELVIS WITH CONTRAST  IMPRESSION: 1. Post recent sigmoid colectomy with left lower  quadrant colostomy. Two small residual foci of air within the pelvic mesentery with mild adjacent thickening, but no abscess or drainable collection. Trace non organized free fluid and stranding in the pelvis. 2. Short segment of small bowel wall thickening and inflammation in the pelvis involving the distal ileum, likely reactive. 3. Dilated distal esophagus, stomach, and small bowel, without discrete transition point, favoring postoperative ileus. 4. Small bilateral pleural effusions and compressive atelectasis. 5. Heterogeneous partially enhancing 14 mm lymph node in the retroperitoneum at the aortoiliac bifurcation, not significantly changed from prior exam. Suspected additional lymph nodes in the anterior common iliac space, not significantly changed from prior exam. Recommend attention at follow-up. 6. Additional chronic findings as described.     08/15/2021 Imaging   EXAM: CT CHEST WITH CONTRAST  IMPRESSION: 1. No evidence of thoracic metastasis. 2. Bilateral small layering pleural effusions with passive atelectasis   08/26/2021 Initial Diagnosis   Cancer of left colon (HCC)   10/10/2021 - 12/12/2021 Chemotherapy   Patient is on Treatment Plan : COLORECTAL Xelox (Capeox) q21d     05/28/2022 - 05/28/2022 Chemotherapy   Patient is on Treatment Plan : COLORECTAL FOLFIRI + Bevacizumab q14d     05/28/2022 -  Chemotherapy   Patient is on Treatment Plan : COLORECTAL FOLFIRI + Panitumumab q14d     07/31/2022 Imaging    IMPRESSION: Decreased bilateral pulmonary metastases.   Stable mild abdominal lymphadenopathy.   No new or progressive metastatic disease within the chest, abdomen, or pelvis.   01/18/2023 Imaging    IMPRESSION: 1. Status post Gertie Gowda pouch sigmoid colon resection with left lower quadrant end colostomy and rectal stump. Unchanged, mild wall thickening of the decompressed distal colon at the ileostomy, as well as the rectal stump. 2. Unchanged small,  treated metastatic pulmonary nodules. No new nodules. 3. Unchanged enlarged, coarsely calcified  treated lower aortocaval metastatic lymph nodes. No new lymphadenopathy. 4. Coronary artery disease. 5. Cholelithiasis.   04/23/2023 Imaging   CT chest, abdomen, and pelvis with contrast  IMPRESSION: 1. Left lower lobe segmental pulmonary emboli, incidental finding. No evidence for right heart strain. 2. Stable trace bilateral pleural effusions. 3. Stable enlarged lower left paraesophageal lymph node. 4. Stable calcified retroperitoneal lymph nodes. No new enlarged lymph nodes in the chest, abdomen or pelvis. 5. Stable subcentimeter soft tissue nodules within the mesenteric fat along the inferior ostomy. 6. Left Bosniak I benign renal cyst measuring 4.6 cm. No follow-up imaging is recommended. JACR 2018 Feb; 264-273, Management of the Incidental Renal Mass on CT, [employer] 2021; 814-848, Bosniak Classification of Cystic Renal Masses, Version 2019.   07/15/2023 Imaging   CT chest abdomen and pelvis with contrast IMPRESSION: 1. Prior partial left hemicolectomy with end colostomy in the left anterior abdominal wall. Unchanged mild wall thickening of the decompressed distal colon at the ileostomy as well as the rectal stump/Hartmann's pouch. 2. Stable enlarged left lower paraesophageal lymph node. 3. Increased size of the treated pulmonary nodule in the lingula now measuring 6 mm previously 4 mm with increased in size favored artifactual given motion degrading examination of the lung bases. Suggest attention on follow-up imaging 4. Unchanged size of the treated pulmonary nodule in the right middle lobe measuring 4 mm. 5. Increased size of prominent bilateral axillary lymph nodes measuring up to 7 mm in short axis, nonspecific. Suggest attention on follow-up imaging 6. No convincing evidence of new or progressive disease in the chest, abdomen or pelvis. 7.  Aortic Atherosclerosis (ICD10-I70.0).         Discussed the use of AI scribe software for clinical note transcription with the patient, who gave verbal consent to proceed.  History of Present Illness   A 58 year old individual with a known diagnosis of metastatic colon cancer is currently undergoing active chemotherapy treatment. He reports no significant issues or side effects from the last cycle of chemotherapy. He has been receiving IV iron infusions due to low iron levels, which is expected to help with anemia and improve energy levels. The patient was scheduled for a CT scan, but due to transportation issues, the scan was not completed and has been rescheduled for the fifth of March. The patient denies any stomach issues such as pain, nausea, or diarrhea. He also denies any skin rash. He reports good appetite and regular bowel movements.         All other systems were reviewed with the patient and are negative.  MEDICAL HISTORY:  Past Medical History:  Diagnosis Date   Cancer (HCC)    Schizophrenia [facility])     SURGICAL HISTORY: Past Surgical History:  Procedure Laterality Date   BRONCHIAL BIOPSY  05/11/2022   Procedure: BRONCHIAL BIOPSIES;  Surgeon: Leslye Peer, MD;  Location: [facility] ENDOSCOPY;  Service: Pulmonary;;   BRONCHIAL BRUSHINGS  05/11/2022   Procedure: BRONCHIAL BRUSHINGS;  Surgeon: Leslye Peer, MD;  Location: [facility] ENDOSCOPY;  Service: Pulmonary;;   BRONCHIAL NEEDLE ASPIRATION BIOPSY  05/11/2022   Procedure: BRONCHIAL NEEDLE ASPIRATION BIOPSIES;  Surgeon: Leslye Peer, MD;  Location: [facility] ENDOSCOPY;  Service: Pulmonary;;   BRONCHIAL WASHINGS  05/11/2022   Procedure: BRONCHIAL WASHINGS;  Surgeon: Leslye Peer, MD;  Location: [facility] ENDOSCOPY;  Service: Pulmonary;;   IR IMAGING GUIDED PORT INSERTION  05/25/2022   LAPAROTOMY N/A 08/08/2021   Procedure: EXPLORATORY LAPAROTOMY;  Surgeon: Berna Bue, MD;  Location: [facility]  OR;  Service: General;  Laterality: N/A;   PARTIAL COLECTOMY N/A 08/08/2021   Procedure:  PARTIAL COLECTOMY WITH COLOSTOMY;  Surgeon: Berna Bue, MD;  Location: [facility] OR;  Service: General;  Laterality: N/A;   VIDEO BRONCHOSCOPY WITH RADIAL ENDOBRONCHIAL ULTRASOUND  05/11/2022   Procedure: VIDEO BRONCHOSCOPY WITH RADIAL ENDOBRONCHIAL ULTRASOUND;  Surgeon: Leslye Peer, MD;  Location: [facility] ENDOSCOPY;  Service: Pulmonary;;    I have reviewed the social history and family history with the patient and they are unchanged from previous note.  ALLERGIES:  has no known allergies.  MEDICATIONS:  Current Outpatient Medications  Medication Sig Dispense Refill   acetaminophen (TYLENOL) 500 MG tablet Take 2 tablets (1,000 mg total) by mouth every 6 (six) hours as needed for mild pain or moderate pain.  0   apixaban (ELIQUIS) 5 MG TABS tablet Take 1 tablet (5 mg total) by mouth 2 (two) times daily. 60 tablet 2   aspirin EC 81 MG tablet Take 81 mg by mouth daily as needed (for pain or headaches).      benztropine (COGENTIN) 1 MG tablet Take 1 mg at bedtime by mouth.     clindamycin (CLINDAGEL) 1 % gel Apply topically 2 (two) times daily. To skin rash on face and upper body 30 g 0   ferrous sulfate 325 (65 FE) MG EC tablet Take 1 tablet (325 mg total) by mouth daily. 30 tablet 3   hydrocortisone cream 1 % Apply 1 Application topically 2 (two) times daily as needed for itching. For rash 30 g 2   loperamide (IMODIUM) 2 MG capsule Take 1 capsule (2 mg total) by mouth as needed for diarrhea or loose stools. 30 capsule 0   magnesium oxide (MAG-OX) 400 (240 Mg) MG tablet Take 1 tablet (400 mg total) by mouth daily. 30 tablet 2   Multiple Vitamin (MULTIVITAMIN WITH MINERALS) TABS tablet Take 1 tablet by mouth daily.     ondansetron (ZOFRAN) 8 MG tablet Take 1 tablet (8 mg total) by mouth every 8 (eight) hours as needed for nausea or vomiting. 30 tablet 0   paliperidone (INVEGA SUSTENNA) 156 MG/ML SUSP injection Inject 156 mg every 30 (thirty) days into the muscle.     polyethylene glycol (MIRALAX /  GLYCOLAX) 17 g packet Take 17 g by mouth daily as needed for mild constipation or moderate constipation.  0   potassium chloride SA (KLOR-CON M) 20 MEQ tablet Take 1 tablet (20 mEq total) by mouth daily. 30 tablet 2   prochlorperazine (COMPAZINE) 10 MG tablet TAKE 1 TABLET(10 MG) BY MOUTH EVERY 6 HOURS AS NEEDED FOR NAUSEA OR VOMITING 30 tablet 0   No current facility-administered medications for this visit.   Facility-Administered Medications Ordered in Other Visits  Medication Dose Route Frequency Provider Last Rate Last Admin   0.9 %  sodium chloride infusion   Intravenous Continuous Malachy Mood, MD 10 mL/hr at 09/30/23 1304 New Bag at 09/30/23 1304   0.9 %  sodium chloride infusion   Intravenous Once PRN Malachy Mood, MD       0.9 %  sodium chloride infusion   Intravenous Once Malachy Mood, MD       fluorouracil (ADRUCIL) 4,350 mg in sodium chloride 0.9 % 63 mL chemo infusion  2,400 mg/m2 (Treatment Plan Recorded) Intravenous 1 day or 1 dose Malachy Mood, MD       heparin lock flush 100 unit/mL  500 Units Intracatheter Once PRN Malachy Mood, MD  leucovorin 728 mg in sodium chloride 0.9 % 250 mL infusion  400 mg/m2 (Treatment Plan Recorded) Intravenous Once Malachy Mood, MD       magnesium sulfate IVPB 4 g 100 mL  4 g Intravenous Once Malachy Mood, MD 50 mL/hr at 09/30/23 1346 4 g at 09/30/23 1346   panitumumab (VECTIBIX) 400 mg in sodium chloride 0.9 % 100 mL chemo infusion  6 mg/kg (Treatment Plan Recorded) Intravenous Once Malachy Mood, MD       sodium chloride flush (NS) 0.9 % injection 10 mL  10 mL Intracatheter PRN Malachy Mood, MD        PHYSICAL EXAMINATION: ECOG PERFORMANCE STATUS: 1 - Symptomatic but completely ambulatory  There were no vitals filed for this visit. Wt Readings from Last 3 Encounters:  09/30/23 149 lb 12 oz (67.9 kg)  09/16/23 150 lb 8 oz (68.3 kg)  09/01/23 151 lb 4.8 oz (68.6 kg)     GENERAL:alert, no distress and comfortable SKIN: skin color, texture, turgor are normal, no  rashes or significant lesions EYES: normal, Conjunctiva are pink and non-injected, sclera clear NECK: supple, thyroid normal size, non-tender, without nodularity LYMPH:  no palpable lymphadenopathy in the cervical, axillary  LUNGS: clear to auscultation and percussion with normal breathing effort HEART: regular rate & rhythm and no murmurs and no lower extremity edema ABDOMEN:abdomen soft, non-tender and normal bowel sounds Musculoskeletal:no cyanosis of digits and no clubbing  NEURO: alert & oriented x 3 with fluent speech, no focal motor/sensory deficits    LABORATORY DATA:  I have reviewed the data as listed    Latest Ref Rng & Units 09/30/2023   11:57 AM 09/16/2023    9:07 AM 09/07/2023    3:08 PM  CBC  WBC 4.0 - 10.5 K/uL 5.2  3.5  5.6   Hemoglobin 13.0 - 17.0 g/dL 16.1  09.6  04.5   Hematocrit 39.0 - 52.0 % 31.5  35.2  31.3   Platelets 150 - 400 K/uL 318  323  374         Latest Ref Rng & Units 09/30/2023   11:57 AM 09/16/2023    9:07 AM 09/07/2023    3:08 PM  CMP  Glucose 70 - 99 mg/dL 88  409  91   BUN 6 - 20 mg/dL 13  8  9    Creatinine 0.61 - 1.24 mg/dL 8.11  9.14  7.82   Sodium 135 - 145 mmol/L 137  139  140   Potassium 3.5 - 5.1 mmol/L 3.6  4.1  3.6   Chloride 98 - 111 mmol/L 108  108  108   CO2 22 - 32 mmol/L 25  25  25    Calcium 8.9 - 10.3 mg/dL 8.7  9.2  8.7   Total Protein 6.5 - 8.1 g/dL 6.6  6.9  6.5   Total Bilirubin 0.0 - 1.2 mg/dL 0.5  0.9  0.7   Alkaline Phos 38 - 126 U/L 86  93  94   AST 15 - 41 U/L 15  15  19    ALT 0 - 44 U/L 11  12  16        RADIOGRAPHIC STUDIES: I have personally reviewed the radiological images as listed and agreed with the findings in the report. No results found.    No orders of the defined types were placed in this encounter.  All questions were answered. The patient knows to call the clinic with any problems, questions or concerns. No barriers to learning  was detected. The total time spent in the appointment was 25  minutes.     Malachy Mood, MD 09/30/2023

## 2023-09-30 NOTE — Patient Instructions (Signed)
 [facility] - A DEPT OF [facility]  Discharge Instructions: Thank you for choosing [facility] to provide your oncology and hematology care.   If you have a lab appointment with the Cancer Center, please go directly to the Cancer Center and check in at the registration area.   Wear comfortable clothing and clothing appropriate for easy access to any Portacath or PICC line.   We strive to give you quality time with your provider. You may need to reschedule your appointment if you arrive late (15 or more minutes).  Arriving late affects you and other patients whose appointments are after yours.  Also, if you miss three or more appointments without notifying the office, you may be dismissed from the clinic at the provider's discretion.      For prescription refill requests, have your pharmacy contact our office and allow 72 hours for refills to be completed.    Today you received the following chemotherapy and/or immunotherapy agents: Vectibix, Leucovorin, 5FU      To help prevent nausea and vomiting after your treatment, we encourage you to take your nausea medication as directed.  BELOW ARE SYMPTOMS THAT SHOULD BE REPORTED IMMEDIATELY: *FEVER GREATER THAN 100.4 F (38 C) OR HIGHER *CHILLS OR SWEATING *NAUSEA AND VOMITING THAT IS NOT CONTROLLED WITH YOUR NAUSEA MEDICATION *UNUSUAL SHORTNESS OF BREATH *UNUSUAL BRUISING OR BLEEDING *URINARY PROBLEMS (pain or burning when urinating, or frequent urination) *BOWEL PROBLEMS (unusual diarrhea, constipation, pain near the anus) TENDERNESS IN MOUTH AND THROAT WITH OR WITHOUT PRESENCE OF ULCERS (sore throat, sores in mouth, or a toothache) UNUSUAL RASH, SWELLING OR PAIN  UNUSUAL VAGINAL DISCHARGE OR ITCHING   Items with * indicate a potential emergency and should be followed up as soon as possible or go to the Emergency Department if any problems should occur.  Please show the CHEMOTHERAPY ALERT CARD  or IMMUNOTHERAPY ALERT CARD at check-in to the Emergency Department and triage nurse.  Should you have questions after your visit or need to cancel or reschedule your appointment, please contact [facility] - A DEPT OF [facility]  Dept: [phone]  and follow the prompts.  Office hours are 8:00 a.m. to 4:30 p.m. Monday - Friday. Please note that voicemails left after 4:00 p.m. may not be returned until the following business day.  We are closed weekends and major holidays. You have access to a nurse at all times for urgent questions. Please call the main number to the clinic Dept: [phone] and follow the prompts.   For any non-urgent questions, you may also contact your provider using MyChart. We now offer e-Visits for anyone 43 and older to request care online for non-urgent symptoms. For details visit [email or web address].   Also download the MyChart app! Go to the app store, search "MyChart", open the app, select [facility], and log in with your MyChart username and password.  Hypomagnesemia Hypomagnesemia is a condition in which the level of magnesium in the blood is too low. Magnesium is a mineral that is found in many foods. It is used in many different processes in the body. Hypomagnesemia can affect every organ in the body. In severe cases, it can cause life-threatening problems. What are the causes? This condition may be caused by: Not getting enough magnesium in your diet or not having enough healthy foods to eat (malnutrition). Problems with magnesium absorption in the intestines. Dehydration. Excessive use of  alcohol. Vomiting. Severe or long-term (chronic) diarrhea. Some medicines, including medicines that make you urinate more often (diuretics). Certain diseases, such as kidney disease, diabetes, celiac disease, and overactive thyroid. What are the signs or symptoms? Symptoms of this condition include: Loss of appetite, nausea, and  vomiting. Involuntary shaking or trembling of a body part (tremor). Muscle weakness or tingling in the arms and legs. Sudden tightening of muscles (muscle spasms). Confusion. Psychiatric issues, such as: Depression and irritability. Psychosis. A feeling of fluttering of the heart (palpitations). Seizures. These symptoms are more severe if magnesium levels drop suddenly. How is this diagnosed? This condition may be diagnosed based on: Your symptoms and medical history. A physical exam. Blood and urine tests. How is this treated? Treatment depends on the cause and the severity of the condition. It may be treated by: Taking a magnesium supplement. This can be taken in pill form. If the condition is severe, magnesium is usually given through an IV. Making changes to your diet. You may be directed to eat foods that have a lot of magnesium, such as green leafy vegetables, peas, beans, and nuts. Not drinking alcohol. If you are struggling not to drink, ask your health care provider for help. Follow these instructions at home: Eating and drinking     Make sure that your diet includes foods with magnesium. Foods that have a lot of magnesium in them include: Green leafy vegetables, such as spinach and broccoli. Beans and peas. Nuts and seeds, such as almonds and sunflower seeds. Whole grains, such as whole grain bread and fortified cereals. Drink fluids that contain salts and minerals (electrolytes), such as sports drinks, when you are active. Do not drink alcohol. General instructions Take over-the-counter and prescription medicines only as told by your health care provider. Take magnesium supplements as directed if your health care provider tells you to take them. Have your magnesium levels monitored as told by your health care provider. Keep all follow-up visits. This is important. Contact a health care provider if: You get worse instead of better. Your symptoms return. Get help  right away if: You develop severe muscle weakness. You have trouble breathing. You feel that your heart is racing. These symptoms may represent a serious problem that is an emergency. Do not wait to see if the symptoms will go away. Get medical help right away. Call your local emergency services (911 in the [place] Do not drive yourself to the hospital. Summary Hypomagnesemia is a condition in which the level of magnesium in the blood is too low. Hypomagnesemia can affect every organ in the body. Treatment may include eating more foods that contain magnesium, taking magnesium supplements, and not drinking alcohol. Have your magnesium levels monitored as told by your health care provider. This information is not intended to replace advice given to you by your health care provider. Make sure you discuss any questions you have with your health care provider. Document Revised: 12/17/2020 Document Reviewed: 12/17/2020 Elsevier Patient Education  2024 [employer].

## 2023-09-30 NOTE — Progress Notes (Signed)
 Per Dr. Mosetta Putt, ok to increase rate of 5FU pump to infuse over 44 hours.   Patient to receive 2g Magnesium on Saturday- patient will need PIV for Mag infusion as 5FU pump will still be infusing. Patient is aware and agreed to have PIV on Saturday.

## 2023-09-30 NOTE — Assessment & Plan Note (Signed)
 stage IIIB p(T3, N1aM0), MSS, KRAS/NRAS/BRAF wild type, lung and node metastasis in 04/2022  -Initially diagnosed in 08/2021 -he completed 4 cycles adjuvant CAPOX 10/10/21 - 12/25/21 (though he somehow was still taking Xeloda through ~02/09/22 due to misunderstandings).  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 -we have changed his treatment to maintenance therapy with 5-fu and vectibix in late March 2024 -He is tolerating treatment very well, will continue. -due to metastasis in both lungs and retroperitoneal lymph nodes, he is not a candidate for surgery or local therapy  -restaging CT12/07/2023 showed stable disease overall, with minimal increase of tiny pulmonary nodule and axillary adenopathy.  Will continue current therapy.

## 2023-10-02 ENCOUNTER — Inpatient Hospital Stay: Payer: MEDICAID | Attending: Hematology

## 2023-10-02 ENCOUNTER — Inpatient Hospital Stay: Payer: MEDICAID

## 2023-10-02 VITALS — BP 95/69 | HR 91 | Temp 97.3°F | Resp 20

## 2023-10-02 DIAGNOSIS — C186 Malignant neoplasm of descending colon: Secondary | ICD-10-CM | POA: Insufficient documentation

## 2023-10-02 DIAGNOSIS — C189 Malignant neoplasm of colon, unspecified: Secondary | ICD-10-CM

## 2023-10-02 DIAGNOSIS — Z79631 Long term (current) use of antimetabolite agent: Secondary | ICD-10-CM | POA: Insufficient documentation

## 2023-10-02 DIAGNOSIS — Z95828 Presence of other vascular implants and grafts: Secondary | ICD-10-CM

## 2023-10-02 DIAGNOSIS — Z5112 Encounter for antineoplastic immunotherapy: Secondary | ICD-10-CM | POA: Insufficient documentation

## 2023-10-02 DIAGNOSIS — Z79899 Other long term (current) drug therapy: Secondary | ICD-10-CM | POA: Insufficient documentation

## 2023-10-02 MED ORDER — MAGNESIUM SULFATE 2 GM/50ML IV SOLN
2.0000 g | Freq: Once | INTRAVENOUS | Status: AC
  Administered 2023-10-02: 2 g via INTRAVENOUS
  Filled 2023-10-02: qty 50

## 2023-10-02 MED ORDER — HEPARIN SOD (PORK) LOCK FLUSH 100 UNIT/ML IV SOLN
500.0000 [IU] | Freq: Once | INTRAVENOUS | Status: AC | PRN
Start: 2023-10-02 — End: 2023-10-02
  Administered 2023-10-02: 500 [IU]

## 2023-10-02 MED ORDER — SODIUM CHLORIDE 0.9 % IV SOLN: INTRAVENOUS | Status: DC

## 2023-10-02 MED ORDER — SODIUM CHLORIDE 0.9% FLUSH
10.0000 mL | INTRAVENOUS | Status: DC | PRN
  Administered 2023-10-02: 10 mL

## 2023-10-06 ENCOUNTER — Ambulatory Visit (HOSPITAL_COMMUNITY)
Admission: RE | Admit: 2023-10-06 | Discharge: 2023-10-06 | Disposition: A | Discharge status: Home / Self Care | Payer: MEDICAID | Source: Ambulatory Visit | Attending: Hematology | Admitting: Hematology

## 2023-10-06 DIAGNOSIS — C186 Malignant neoplasm of descending colon: Secondary | ICD-10-CM | POA: Insufficient documentation

## 2023-10-06 MED ORDER — IOHEXOL 300 MG/ML  SOLN
100.0000 mL | Freq: Once | INTRAMUSCULAR | Status: AC | PRN
  Administered 2023-10-06: 100 mL via INTRAVENOUS

## 2023-10-06 MED ORDER — IOHEXOL 300 MG/ML  SOLN
30.0000 mL | Freq: Once | INTRAMUSCULAR | Status: AC | PRN
  Administered 2023-10-06: 30 mL via ORAL

## 2023-10-07 ENCOUNTER — Other Ambulatory Visit: Payer: Self-pay

## 2023-10-07 ENCOUNTER — Inpatient Hospital Stay: Payer: MEDICAID

## 2023-10-07 ENCOUNTER — Telehealth: Payer: Self-pay | Admitting: Hematology

## 2023-10-07 NOTE — Telephone Encounter (Signed)
 Unable to leave a voicemail on patient's contact regarding cancelled appointment; was able to send a message notification through voicemail stated that we called with callback number attached, was able to leave a voicemail on patient's sister contact stated the cancelled appointment for 10/07/2023; left callback number for patient's sister if needing to reach out

## 2023-10-13 ENCOUNTER — Other Ambulatory Visit: Payer: Self-pay | Admitting: Nurse Practitioner

## 2023-10-13 DIAGNOSIS — D500 Iron deficiency anemia secondary to blood loss (chronic): Secondary | ICD-10-CM

## 2023-10-13 DIAGNOSIS — C186 Malignant neoplasm of descending colon: Secondary | ICD-10-CM

## 2023-10-13 NOTE — Assessment & Plan Note (Signed)
 stage IIIB p(T3, N1aM0), MSS, KRAS/NRAS/BRAF wild type, lung and node metastasis in 04/2022  -Initially diagnosed in 08/2021 -he completed 4 cycles adjuvant CAPOX 10/10/21 - 12/25/21 (though he somehow was still taking Xeloda through ~02/09/22 due to misunderstandings).  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 -we have changed his treatment to maintenance therapy with 5-fu and vectibix in late March 2024 -He is tolerating treatment very well, will continue. -due to metastasis in both lungs and retroperitoneal lymph nodes, he is not a candidate for surgery or local therapy  -restaging CT12/07/2023 showed stable disease overall, with minimal increase of tiny pulmonary nodule and axillary adenopathy.  Will continue current therapy.

## 2023-10-13 NOTE — Progress Notes (Unsigned)
 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 -we have changed his treatment to maintenance therapy with 5-fu and vectibix in late March 2024 -He is tolerating treatment very well, will continue. -due to metastasis in both lungs and retroperitoneal lymph nodes, he is not a candidate for surgery or local therapy  -restaging CT12/07/2023 showed stable disease overall, with minimal increase of tiny pulmonary nodule and axillary adenopathy.  Will continue current therapy. -Restaging CT from 10/06/2022 showed overall stable disease.  Small increase in pulmonary nodule.  Continue current therapy with FOLFIRI and panitumumab.  Next scan in 3 months.    Hypomagnesemia  Administer 4 g IV magnesium during today's visit.  Add additional 2 g IV magnesium on day of pump DC. Check magnesium level prior to next treatment and administer supplemental magnesium as indicated.  Plan:  Reviewed CT CAP with patient and his mother.  Overall stable disease with slight increase in size of pulmonary nodule.  Continue current therapy with FOLFIRI and panitumumab. Labs  reviewed.  Persistent hypomagnesemia.  Treat with 4 g supplemental magnesium today and additional 2 g IV magnesium on day of 5-FU pump DC. Proceed with FOLFIRI and panitumumab treatment today. Labs/flush, follow-up, and treatment in 2 weeks as scheduled.  The patient understands the plans discussed today and is in agreement with them.  He knows to contact our office if he develops concerns prior to his next appointment.  I provided 25 minutes of face-to-face time during this encounter and > 50% was spent counseling as documented under my assessment and plan.    Carlean Jews, NP  [facility] - A DEPT [facility] [address] [place] [address] Dept: [phone] Dept Fax: [phone]   No orders of the defined types were placed in this encounter.     CHIEF COMPLAINT:  CC: Left colon cancer  Current Treatment: FOLFIRI with panitumumab  INTERVAL HISTORY:  Matthew [name] is here today for repeat clinical assessment.  He had CT CAP with contrast performed on 10/06/2023.  There is a slight increase in the size of pulmonary nodule, now measuring 7 mm, concerning for pulmonary metastatic disease.  There are additional bilateral pulmonary nodules which are stable.  There are stable and prominent axillary, low paraesophageal and retroperitoneal lymph nodes.  There is similar appearing patulous esophagus with symmetric distal esophageal wall thickening.  There is trace right pleural effusion.  Due to his colon cancer, he has also been severely anemic.  Most recently, he did receive a dose of Monoferric.  CBC on 09/30/2023 was 10.0.  Most recent ferritin was done 09/16/2023 with a level of 6.  He denies  chest pain, chest pressure, or shortness of breath. He denies headaches or visual disturbances. He denies abdominal pain, nausea, vomiting, or changes in bowel or bladder habits.  He denies fevers or chills. He denies pain. His appetite  is good. His weight has increased 2 pounds over last 3 weeks .  I have reviewed the past medical history, past surgical history, social history and family history with the patient and they are unchanged from previous note.  ALLERGIES:  has no known allergies.  MEDICATIONS:  Current Outpatient Medications  Medication Sig Dispense Refill   acetaminophen (TYLENOL) 500 MG tablet Take 2 tablets (1,000 mg total) by mouth every 6 (six) hours as needed for mild pain or moderate pain.  0   aspirin EC 81 MG tablet Take 81 mg by mouth daily as needed (for pain or headaches).      benztropine (COGENTIN) 1 MG tablet Take 1 mg at bedtime by mouth.     clindamycin (CLINDAGEL) 1 % gel Apply topically 2 (two) times daily. To skin rash on face and upper body 30 g 0   ferrous sulfate 325 (65 FE) MG EC tablet Take 1 tablet (325 mg total) by mouth daily. 30 tablet 3   hydrocortisone cream 1 % Apply 1 Application topically 2 (two) times daily as needed for itching. For rash 30 g 2   loperamide (IMODIUM) 2 MG capsule Take 1 capsule (2 mg total) by mouth as needed for diarrhea or loose stools. 30 capsule 0   magnesium oxide (MAG-OX) 400 (240 Mg) MG tablet Take 1 tablet (400 mg total) by mouth daily. 30 tablet 2   Multiple Vitamin (MULTIVITAMIN WITH MINERALS) TABS tablet Take 1 tablet by mouth daily.     ondansetron (ZOFRAN) 8 MG tablet Take 1 tablet (8 mg total) by mouth every 8 (eight) hours as needed for nausea or vomiting. 30 tablet 0   paliperidone (INVEGA SUSTENNA) 156 MG/ML SUSP injection Inject 156 mg every 30 (thirty) days into the muscle.     polyethylene glycol (MIRALAX / GLYCOLAX) 17 g packet Take 17 g by mouth daily as needed for mild constipation or moderate constipation.  0   potassium chloride SA (KLOR-CON M) 20 MEQ tablet Take 1 tablet (20 mEq total) by mouth daily. 30 tablet 2   prochlorperazine (COMPAZINE) 10 MG tablet TAKE 1 TABLET(10 MG) BY MOUTH EVERY 6 HOURS AS NEEDED FOR NAUSEA OR VOMITING 30  tablet 0   apixaban (ELIQUIS) 5 MG TABS tablet Take 1 tablet (5 mg total) by mouth 2 (two) times daily. 60 tablet 2   No current facility-administered medications for this visit.    HISTORY OF PRESENT ILLNESS:   Oncology History Overview Note   Cancer Staging  Cancer of left colon [facility]) Staging form: Colon and Rectum, AJCC 8th Edition - Pathologic stage from 08/08/2021: Stage IIIB (pT3, pN1a, cM0) - Signed by Malachy Mood, MD on 08/26/2021    Cancer of left colon (HCC)  04/01/2020 Imaging   IMPRESSION: 1. Gallbladder decompressed bowel also partially calcified gallstones. No pericholecystic inflammation though if there is persisting clinical concern for cholecystitis right upper quadrant ultrasound could be obtained. 2. Circumferential thickening of the distal thoracic esophagus. Could reflect features of esophagitis. Correlate with clinical symptoms and consider endoscopy as clinically warranted. 3. Additional segmental thickening of the mid to distal sigmoid with focal narrowing. No acute surrounding inflammation or resulting obstruction. Findings are nonspecific, and could reflect sequela of prior inflammation/infection. However, recommend correlation with colonoscopy if  not recently performed. 4. Mild circumferential bladder wall thickening and indentation of the bladder base by an enlarged prostate. Possibly sequela of chronic outlet obstruction though could correlate with urinalysis to exclude cystitis. 5. Aortic Atherosclerosis (ICD10-I70.0).   08/06/2021 Imaging   IMPRESSION: Sigmoid colonic perforation with small free intraperitoneal gas and infiltration of the mesenteric and omental fat in keeping with changes of peritonitis.   Long segment inflammatory stranding of the sigmoid colon in keeping with a severe infectious or inflammatory colitis. This terminates an area of irregular mural thickening, infiltrative soft tissue within the colonic mesentery, and focal dystrophic  calcification. This may represent a chronic inflammatory process, however, a perforated malignancy could appear similarly. There are 2 separate points of perforation which again raise the question of an underlying malignancy.   7.1 cm gas and fluid containing pericolonic abscess within the sigmoid mesentery.   Marked inflammatory change of the terminal ileum adjacent to the pericolonic abscess with resultant small bowel obstruction. Fluid within the distal esophagus likely relates to gastroesophageal reflux the setting of vomiting.   Aortic Atherosclerosis (ICD10-I70.0).   08/08/2021 Cancer Staging   Staging form: Colon and Rectum, AJCC 8th Edition - Pathologic stage from 08/08/2021: Stage IIIB (pT3, pN1a, cM0) - Signed by Malachy Mood, MD on 08/26/2021 Stage prefix: Initial diagnosis Total positive nodes: 1 Histologic grading system: 4 grade system Histologic grade (G): G2 Residual tumor (R): R0 - None   08/08/2021 Definitive Surgery   FINAL MICROSCOPIC DIAGNOSIS:   A. COLON, SIGMOID, PARTIAL COLECTOMY:  - Invasive moderately differentiated adenocarcinoma.  - Metastatic carcinoma involving one of twelve lymph nodes (1/12).  - See oncology table below.   ADDENDUM:  Mismatch Repair Protein (IHC)  SUMMARY INTERPRETATION: NORMAL    08/14/2021 Imaging   EXAM: CT ABDOMEN AND PELVIS WITH CONTRAST  IMPRESSION: 1. Post recent sigmoid colectomy with left lower quadrant colostomy. Two small residual foci of air within the pelvic mesentery with mild adjacent thickening, but no abscess or drainable collection. Trace non organized free fluid and stranding in the pelvis. 2. Short segment of small bowel wall thickening and inflammation in the pelvis involving the distal ileum, likely reactive. 3. Dilated distal esophagus, stomach, and small bowel, without discrete transition point, favoring postoperative ileus. 4. Small bilateral pleural effusions and compressive atelectasis. 5. Heterogeneous  partially enhancing 14 mm lymph node in the retroperitoneum at the aortoiliac bifurcation, not significantly changed from prior exam. Suspected additional lymph nodes in the anterior common iliac space, not significantly changed from prior exam. Recommend attention at follow-up. 6. Additional chronic findings as described.     08/15/2021 Imaging   EXAM: CT CHEST WITH CONTRAST  IMPRESSION: 1. No evidence of thoracic metastasis. 2. Bilateral small layering pleural effusions with passive atelectasis   08/26/2021 Initial Diagnosis   Cancer of left colon (HCC)   10/10/2021 - 12/12/2021 Chemotherapy   Patient is on Treatment Plan : COLORECTAL Xelox (Capeox) q21d     05/28/2022 - 05/28/2022 Chemotherapy   Patient is on Treatment Plan : COLORECTAL FOLFIRI + Bevacizumab q14d     05/28/2022 -  Chemotherapy   Patient is on Treatment Plan : COLORECTAL FOLFIRI + Panitumumab q14d     07/31/2022 Imaging    IMPRESSION: Decreased bilateral pulmonary metastases.   Stable mild abdominal lymphadenopathy.   No new or progressive metastatic disease within the chest, abdomen, or pelvis.   01/18/2023 Imaging    IMPRESSION: 1. Status post Gertie Gowda pouch sigmoid colon resection with left lower quadrant end colostomy  and rectal stump. Unchanged, mild wall thickening of the decompressed distal colon at the ileostomy, as well as the rectal stump. 2. Unchanged small, treated metastatic pulmonary nodules. No new nodules. 3. Unchanged enlarged, coarsely calcified treated lower aortocaval metastatic lymph nodes. No new lymphadenopathy. 4. Coronary artery disease. 5. Cholelithiasis.   04/23/2023 Imaging   CT chest, abdomen, and pelvis with contrast  IMPRESSION: 1. Left lower lobe segmental pulmonary emboli, incidental finding. No evidence for right heart strain. 2. Stable trace bilateral pleural effusions. 3. Stable enlarged lower left paraesophageal lymph node. 4. Stable calcified retroperitoneal  lymph nodes. No new enlarged lymph nodes in the chest, abdomen or pelvis. 5. Stable subcentimeter soft tissue nodules within the mesenteric fat along the inferior ostomy. 6. Left Bosniak I benign renal cyst measuring 4.6 cm. No follow-up imaging is recommended. JACR 2018 Feb; 264-273, Management of the Incidental Renal Mass on CT, [employer] 2021; 814-848, Bosniak Classification of Cystic Renal Masses, Version 2019.   07/15/2023 Imaging   CT chest abdomen and pelvis with contrast IMPRESSION: 1. Prior partial left hemicolectomy with end colostomy in the left anterior abdominal wall. Unchanged mild wall thickening of the decompressed distal colon at the ileostomy as well as the rectal stump/Hartmann's pouch. 2. Stable enlarged left lower paraesophageal lymph node. 3. Increased size of the treated pulmonary nodule in the lingula now measuring 6 mm previously 4 mm with increased in size favored artifactual given motion degrading examination of the lung bases. Suggest attention on follow-up imaging 4. Unchanged size of the treated pulmonary nodule in the right middle lobe measuring 4 mm. 5. Increased size of prominent bilateral axillary lymph nodes measuring up to 7 mm in short axis, nonspecific. Suggest attention on follow-up imaging 6. No convincing evidence of new or progressive disease in the chest, abdomen or pelvis. 7.  Aortic Atherosclerosis (ICD10-I70.0).     10/06/2023 Imaging   CT chest abdomen pelvis with contrast IMPRESSION: 1. Increased size of the pulmonary nodule in the lingula now measuring 7 mm, concerning for pulmonary metastatic disease recurrence. 2. Additional bilateral pulmonary nodules are stable. 3. Stable prominent bilateral axillary, low paraesophageal and retroperitoneal lymph nodes. 4. Prior partial left hemicolectomy with Hartmann's pouch formation and left anterior abdominal wall colostomy. No suspicious nodularity along the Hartmann's suture line. 5. Patulous  esophagus with symmetric distal esophageal wall thickening, similar prior. 6. Trace right pleural effusion. 7.  Aortic Atherosclerosis (ICD10-I70.0).         REVIEW OF SYSTEMS:   Constitutional: Denies fevers, chills or abnormal weight loss Eyes: Denies blurriness of vision Ears, nose, mouth, throat, and face: Denies mucositis or sore throat Respiratory: Denies cough, dyspnea or wheezes Cardiovascular: Denies palpitation, chest discomfort or lower extremity swelling Gastrointestinal:  Denies nausea, heartburn or change in bowel habits Skin: Denies abnormal skin rashes Lymphatics: Denies new lymphadenopathy or easy bruising Neurological:Denies numbness, tingling or new weaknesses Behavioral/Psych: Mood is stable, no new changes  All other systems were reviewed with the patient and are negative.   VITALS:   Today's Vitals   10/14/23 1157  BP: 110/70  Pulse: 90  Resp: 17  Temp: 97.7 F (36.5 C)  TempSrc: Temporal  SpO2: 99%  Weight: 151 lb 1.6 oz (68.5 kg)  PainSc: 0-No pain   Body mass index is 22.31 kg/m.   Wt Readings from Last 3 Encounters:  10/14/23 151 lb 1.6 oz (68.5 kg)  09/30/23 149 lb 12 oz (67.9 kg)  09/16/23 150 lb 8 oz (68.3 kg)  Body mass index is 22.31 kg/m.  Performance status (ECOG): 1 - Symptomatic but completely ambulatory  PHYSICAL EXAM:   GENERAL:alert, no distress and comfortable SKIN: skin color, texture, turgor are normal, no rashes or significant lesions EYES: normal, Conjunctiva are pink and non-injected, sclera clear OROPHARYNX:no exudate, no erythema and lips, buccal mucosa, and tongue normal  NECK: supple, thyroid normal size, non-tender, without nodularity LYMPH:  no palpable lymphadenopathy in the cervical, axillary or inguinal LUNGS: clear to auscultation and percussion with normal breathing effort HEART: regular rate & rhythm and no murmurs and no lower extremity edema ABDOMEN:abdomen soft, non-tender and normal bowel  sounds Musculoskeletal:no cyanosis of digits and no clubbing  NEURO: alert & oriented x 3 with fluent speech, no focal motor/sensory deficits  LABORATORY DATA:  I have reviewed the data as listed    Component Value Date/Time   NA 139 10/14/2023 1130   K 3.6 10/14/2023 1130   CL 107 10/14/2023 1130   CO2 26 10/14/2023 1130   GLUCOSE 89 10/14/2023 1130   BUN 9 10/14/2023 1130   CREATININE 0.66 10/14/2023 1130   CALCIUM 8.5 (L) 10/14/2023 1130   PROT 6.9 10/14/2023 1130   ALBUMIN 4.1 10/14/2023 1130   AST 21 10/14/2023 1130   ALT 21 10/14/2023 1130   ALKPHOS 116 10/14/2023 1130   BILITOT 0.7 10/14/2023 1130   GFRNONAA >60 10/14/2023 1130   GFRAA >60 04/01/2020 1237     Lab Results  Component Value Date   WBC 4.5 10/14/2023   NEUTROABS 2.9 10/14/2023   HGB 11.8 (L) 10/14/2023   HCT 37.3 (L) 10/14/2023   MCV 81.8 10/14/2023   PLT 311 10/14/2023      RADIOGRAPHIC STUDIES: CT CHEST ABDOMEN PELVIS W CONTRAST Result Date: 10/06/2023 CLINICAL DATA:  Metastatic colon cancer, assess treatment response. * Tracking Code: BO * EXAM: CT CHEST, ABDOMEN, AND PELVIS WITH CONTRAST TECHNIQUE: Multidetector CT imaging of the chest, abdomen and pelvis was performed following the standard protocol during bolus administration of intravenous contrast. RADIATION DOSE REDUCTION: This exam was performed according to the departmental dose-optimization program which includes automated exposure control, adjustment of the mA and/or kV according to patient size and/or use of iterative reconstruction technique. CONTRAST:  OMNIPAQUE IOHEXOL 300 MG/ML SOLN, 30mL OMNIPAQUE IOHEXOL 300 MG/ML SOLN COMPARISON:  Multiple priors including CT July 15, 2023 FINDINGS: CT CHEST FINDINGS Cardiovascular: Right chest Port-A-Cath with tip at the superior cavoatrial junction. Normal caliber thoracic aorta. Normal size heart. Trace pericardial effusion is similar prior. Mediastinum/Nodes: No suspicious thyroid nodule.  Prominent bilateral axillary lymph nodes are similar prior for instance a 7 mm right axillary lymph node on image 18/2, unchanged. Low paraesophageal lymph node measures 9 mm in short axis on image 39/2, unchanged. No new pathologically enlarged mediastinal, hilar or axillary lymph nodes. Patulous esophagus with symmetric distal esophageal wall thickening, similar prior. Lungs/Pleura: Increased size of the pulmonary nodule in the lingula now measuring 7 mm on image 87/6 previously 6 mm. Right middle lobe pulmonary nodule measures 4 mm on image 91/6, unchanged. Stable 3 mm right upper lobe pulmonary nodule on image 52/6 Stable 3 mm left upper lobe pulmonary nodule on image 29/6 No new suspicious pulmonary nodules or masses. Trace right pleural effusion. Musculoskeletal: No aggressive lytic or blastic lesion of bone. CT ABDOMEN PELVIS FINDINGS Hepatobiliary: No suspicious hepatic lesion. Gallbladder is unremarkable. No biliary ductal dilation. Pancreas: No pancreatic ductal dilation or evidence of acute inflammation. Spleen: No splenomegaly. Adrenals/Urinary Tract: Bilateral adrenal glands  appear normal. Left renal cyst. No hydronephrosis. Kidneys demonstrate symmetric enhancement. Urinary bladder is unremarkable for degree of distension. Stomach/Bowel: Radiopaque enteric contrast material traverses distal loops of small bowel. Stomach is unremarkable for degree of distension. No pathologic dilation of small or large bowel. No evidence of acute bowel inflammation. Prior partial left hemicolectomy with Hartmann's pouch formation and left anterior abdominal wall colostomy. No suspicious nodularity along the Hartmann's suture line. Vascular/Lymphatic: Normal caliber abdominal aorta. Smooth IVC contours. Coarsely calcified retroperitoneal lymph nodes measure up to 10 mm in short axis at the aortocaval station on image 83/2, unchanged. No new pathologically enlarged abdominal or pelvic lymph nodes. Reproductive: Prostate  gland is unremarkable. Other: No significant abdominopelvic free fluid. Musculoskeletal: No aggressive lytic or blastic lesion of bone. IMPRESSION: 1. Increased size of the pulmonary nodule in the lingula now measuring 7 mm, concerning for pulmonary metastatic disease recurrence. 2. Additional bilateral pulmonary nodules are stable. 3. Stable prominent bilateral axillary, low paraesophageal and retroperitoneal lymph nodes. 4. Prior partial left hemicolectomy with Hartmann's pouch formation and left anterior abdominal wall colostomy. No suspicious nodularity along the Hartmann's suture line. 5. Patulous esophagus with symmetric distal esophageal wall thickening, similar prior. 6. Trace right pleural effusion. 7.  Aortic Atherosclerosis (ICD10-I70.0). Electronically Signed   By: Maudry Mayhew M.D.   On: 10/06/2023 18:39

## 2023-10-14 ENCOUNTER — Encounter: Payer: Self-pay | Admitting: Hematology

## 2023-10-14 ENCOUNTER — Inpatient Hospital Stay: Payer: MEDICAID

## 2023-10-14 ENCOUNTER — Inpatient Hospital Stay (HOSPITAL_BASED_OUTPATIENT_CLINIC_OR_DEPARTMENT_OTHER): Payer: MEDICAID | Admitting: Nurse Practitioner

## 2023-10-14 VITALS — BP 110/70 | HR 90 | Temp 97.7°F | Resp 17 | Wt 151.1 lb

## 2023-10-14 DIAGNOSIS — C186 Malignant neoplasm of descending colon: Secondary | ICD-10-CM

## 2023-10-14 DIAGNOSIS — C7800 Secondary malignant neoplasm of unspecified lung: Secondary | ICD-10-CM

## 2023-10-14 DIAGNOSIS — Z5112 Encounter for antineoplastic immunotherapy: Secondary | ICD-10-CM | POA: Diagnosis not present

## 2023-10-14 DIAGNOSIS — Z95828 Presence of other vascular implants and grafts: Secondary | ICD-10-CM

## 2023-10-14 DIAGNOSIS — D500 Iron deficiency anemia secondary to blood loss (chronic): Secondary | ICD-10-CM

## 2023-10-14 LAB — CBC WITH DIFFERENTIAL (CANCER CENTER ONLY)
Abs Immature Granulocytes: 0.02 10*3/uL (ref 0.00–0.07)
Basophils Absolute: 0 10*3/uL (ref 0.0–0.1)
Basophils Relative: 1 %
Eosinophils Absolute: 0.1 10*3/uL (ref 0.0–0.5)
Eosinophils Relative: 2 %
HCT: 37.3 % — ABNORMAL LOW (ref 39.0–52.0)
Hemoglobin: 11.8 g/dL — ABNORMAL LOW (ref 13.0–17.0)
Immature Granulocytes: 0 %
Lymphocytes Relative: 22 %
Lymphs Abs: 1 10*3/uL (ref 0.7–4.0)
MCH: 25.9 pg — ABNORMAL LOW (ref 26.0–34.0)
MCHC: 31.6 g/dL (ref 30.0–36.0)
MCV: 81.8 fL (ref 80.0–100.0)
Monocytes Absolute: 0.4 10*3/uL (ref 0.1–1.0)
Monocytes Relative: 10 %
Neutro Abs: 2.9 10*3/uL (ref 1.7–7.7)
Neutrophils Relative %: 65 %
Platelet Count: 311 10*3/uL (ref 150–400)
RBC: 4.56 MIL/uL (ref 4.22–5.81)
RDW: 27.1 % — ABNORMAL HIGH (ref 11.5–15.5)
WBC Count: 4.5 10*3/uL (ref 4.0–10.5)
nRBC: 0 % (ref 0.0–0.2)

## 2023-10-14 LAB — CMP (CANCER CENTER ONLY)
ALT: 21 U/L (ref 0–44)
AST: 21 U/L (ref 15–41)
Albumin: 4.1 g/dL (ref 3.5–5.0)
Alkaline Phosphatase: 116 U/L (ref 38–126)
Anion gap: 6 (ref 5–15)
BUN: 9 mg/dL (ref 6–20)
CO2: 26 mmol/L (ref 22–32)
Calcium: 8.5 mg/dL — ABNORMAL LOW (ref 8.9–10.3)
Chloride: 107 mmol/L (ref 98–111)
Creatinine: 0.66 mg/dL (ref 0.61–1.24)
GFR, Estimated: >60 mL/min (ref >60)
Glucose, Bld: 89 mg/dL (ref 70–99)
Potassium: 3.6 mmol/L (ref 3.5–5.1)
Sodium: 139 mmol/L (ref 135–145)
Total Bilirubin: 0.7 mg/dL (ref 0.0–1.2)
Total Protein: 6.9 g/dL (ref 6.5–8.1)

## 2023-10-14 LAB — MAGNESIUM: Magnesium: 1.1 mg/dL — ABNORMAL LOW (ref 1.7–2.4)

## 2023-10-14 LAB — CEA (ACCESS): CEA (CHCC): <1 ng/mL (ref 0.00–5.00)

## 2023-10-14 LAB — FERRITIN: Ferritin: 243 ng/mL (ref 24–336)

## 2023-10-14 MED ORDER — SODIUM CHLORIDE 0.9 % IV SOLN: Freq: Once | INTRAVENOUS | Status: AC

## 2023-10-14 MED ORDER — SODIUM CHLORIDE 0.9 % IV SOLN
400.0000 mg/m2 | Freq: Once | INTRAVENOUS | Status: AC
  Administered 2023-10-14: 728 mg via INTRAVENOUS
  Filled 2023-10-14: qty 25

## 2023-10-14 MED ORDER — MAGNESIUM SULFATE 4 GM/100ML IV SOLN
4.0000 g | Freq: Once | INTRAVENOUS | Status: AC
  Administered 2023-10-14: 4 g via INTRAVENOUS
  Filled 2023-10-14: qty 100

## 2023-10-14 MED ORDER — SODIUM CHLORIDE 0.9 % IV SOLN
2400.0000 mg/m2 | INTRAVENOUS | Status: DC
  Administered 2023-10-14: 4350 mg via INTRAVENOUS
  Filled 2023-10-14: qty 87

## 2023-10-14 MED ORDER — APIXABAN 5 MG PO TABS: 5.0000 mg | ORAL_TABLET | Freq: Two times a day (BID) | ORAL | 2 refills | Status: AC

## 2023-10-14 MED ORDER — SODIUM CHLORIDE 0.9% FLUSH: 10.0000 mL | INTRAVENOUS | Status: DC | PRN

## 2023-10-14 MED ORDER — SODIUM CHLORIDE 0.9 % IV SOLN
6.0000 mg/kg | Freq: Once | INTRAVENOUS | Status: AC
  Administered 2023-10-14: 400 mg via INTRAVENOUS
  Filled 2023-10-14: qty 20

## 2023-10-14 MED ORDER — SODIUM CHLORIDE 0.9% FLUSH
10.0000 mL | Freq: Once | INTRAVENOUS | Status: AC
  Administered 2023-10-14: 10 mL

## 2023-10-14 NOTE — Progress Notes (Signed)
 Per Dr. Mosetta Putt, ok to increase rate of 5FU pump to go over 44 hours.  Patient is aware and agreed to have 2g magnesium run through a PIV on Saturday with pump D/C.

## 2023-10-14 NOTE — Patient Instructions (Signed)
 [facility] - A DEPT OF [facility]  Discharge Instructions: Thank you for choosing [facility] to provide your oncology and hematology care.   If you have a lab appointment with the Cancer Center, please go directly to the Cancer Center and check in at the registration area.   Wear comfortable clothing and clothing appropriate for easy access to any Portacath or PICC line.   We strive to give you quality time with your provider. You may need to reschedule your appointment if you arrive late (15 or more minutes).  Arriving late affects you and other patients whose appointments are after yours.  Also, if you miss three or more appointments without notifying the office, you may be dismissed from the clinic at the provider's discretion.      For prescription refill requests, have your pharmacy contact our office and allow 72 hours for refills to be completed.    Today you received the following chemotherapy and/or immunotherapy agents: Vectibix, Leucovorin, 5FU      To help prevent nausea and vomiting after your treatment, we encourage you to take your nausea medication as directed.  BELOW ARE SYMPTOMS THAT SHOULD BE REPORTED IMMEDIATELY: *FEVER GREATER THAN 100.4 F (38 C) OR HIGHER *CHILLS OR SWEATING *NAUSEA AND VOMITING THAT IS NOT CONTROLLED WITH YOUR NAUSEA MEDICATION *UNUSUAL SHORTNESS OF BREATH *UNUSUAL BRUISING OR BLEEDING *URINARY PROBLEMS (pain or burning when urinating, or frequent urination) *BOWEL PROBLEMS (unusual diarrhea, constipation, pain near the anus) TENDERNESS IN MOUTH AND THROAT WITH OR WITHOUT PRESENCE OF ULCERS (sore throat, sores in mouth, or a toothache) UNUSUAL RASH, SWELLING OR PAIN  UNUSUAL VAGINAL DISCHARGE OR ITCHING   Items with * indicate a potential emergency and should be followed up as soon as possible or go to the Emergency Department if any problems should occur.  Please show the CHEMOTHERAPY ALERT CARD  or IMMUNOTHERAPY ALERT CARD at check-in to the Emergency Department and triage nurse.  Should you have questions after your visit or need to cancel or reschedule your appointment, please contact [facility] - A DEPT OF [facility]  Dept: [phone]  and follow the prompts.  Office hours are 8:00 a.m. to 4:30 p.m. Monday - Friday. Please note that voicemails left after 4:00 p.m. may not be returned until the following business day.  We are closed weekends and major holidays. You have access to a nurse at all times for urgent questions. Please call the main number to the clinic Dept: [phone] and follow the prompts.   For any non-urgent questions, you may also contact your provider using MyChart. We now offer e-Visits for anyone 43 and older to request care online for non-urgent symptoms. For details visit [email or web address].   Also download the MyChart app! Go to the app store, search "MyChart", open the app, select [facility], and log in with your MyChart username and password.  Hypomagnesemia Hypomagnesemia is a condition in which the level of magnesium in the blood is too low. Magnesium is a mineral that is found in many foods. It is used in many different processes in the body. Hypomagnesemia can affect every organ in the body. In severe cases, it can cause life-threatening problems. What are the causes? This condition may be caused by: Not getting enough magnesium in your diet or not having enough healthy foods to eat (malnutrition). Problems with magnesium absorption in the intestines. Dehydration. Excessive use of  alcohol. Vomiting. Severe or long-term (chronic) diarrhea. Some medicines, including medicines that make you urinate more often (diuretics). Certain diseases, such as kidney disease, diabetes, celiac disease, and overactive thyroid. What are the signs or symptoms? Symptoms of this condition include: Loss of appetite, nausea, and  vomiting. Involuntary shaking or trembling of a body part (tremor). Muscle weakness or tingling in the arms and legs. Sudden tightening of muscles (muscle spasms). Confusion. Psychiatric issues, such as: Depression and irritability. Psychosis. A feeling of fluttering of the heart (palpitations). Seizures. These symptoms are more severe if magnesium levels drop suddenly. How is this diagnosed? This condition may be diagnosed based on: Your symptoms and medical history. A physical exam. Blood and urine tests. How is this treated? Treatment depends on the cause and the severity of the condition. It may be treated by: Taking a magnesium supplement. This can be taken in pill form. If the condition is severe, magnesium is usually given through an IV. Making changes to your diet. You may be directed to eat foods that have a lot of magnesium, such as green leafy vegetables, peas, beans, and nuts. Not drinking alcohol. If you are struggling not to drink, ask your health care provider for help. Follow these instructions at home: Eating and drinking     Make sure that your diet includes foods with magnesium. Foods that have a lot of magnesium in them include: Green leafy vegetables, such as spinach and broccoli. Beans and peas. Nuts and seeds, such as almonds and sunflower seeds. Whole grains, such as whole grain bread and fortified cereals. Drink fluids that contain salts and minerals (electrolytes), such as sports drinks, when you are active. Do not drink alcohol. General instructions Take over-the-counter and prescription medicines only as told by your health care provider. Take magnesium supplements as directed if your health care provider tells you to take them. Have your magnesium levels monitored as told by your health care provider. Keep all follow-up visits. This is important. Contact a health care provider if: You get worse instead of better. Your symptoms return. Get help  right away if: You develop severe muscle weakness. You have trouble breathing. You feel that your heart is racing. These symptoms may represent a serious problem that is an emergency. Do not wait to see if the symptoms will go away. Get medical help right away. Call your local emergency services (911 in the [place] Do not drive yourself to the hospital. Summary Hypomagnesemia is a condition in which the level of magnesium in the blood is too low. Hypomagnesemia can affect every organ in the body. Treatment may include eating more foods that contain magnesium, taking magnesium supplements, and not drinking alcohol. Have your magnesium levels monitored as told by your health care provider. This information is not intended to replace advice given to you by your health care provider. Make sure you discuss any questions you have with your health care provider. Document Revised: 12/17/2020 Document Reviewed: 12/17/2020 Elsevier Patient Education  2024 [employer].

## 2023-10-15 ENCOUNTER — Encounter: Payer: Self-pay | Admitting: Hematology

## 2023-10-15 ENCOUNTER — Encounter: Payer: Self-pay | Admitting: Nurse Practitioner

## 2023-10-16 ENCOUNTER — Inpatient Hospital Stay: Payer: MEDICAID

## 2023-10-16 VITALS — BP 102/63 | HR 82 | Temp 98.0°F | Resp 17

## 2023-10-16 DIAGNOSIS — Z5112 Encounter for antineoplastic immunotherapy: Secondary | ICD-10-CM | POA: Diagnosis not present

## 2023-10-16 MED ORDER — SODIUM CHLORIDE 0.9 % IV SOLN: Freq: Once | INTRAVENOUS | Status: AC

## 2023-10-16 MED ORDER — HEPARIN SOD (PORK) LOCK FLUSH 100 UNIT/ML IV SOLN
500.0000 [IU] | Freq: Once | INTRAVENOUS | Status: AC | PRN
  Administered 2023-10-16: 500 [IU]

## 2023-10-16 MED ORDER — SODIUM CHLORIDE 0.9% FLUSH
10.0000 mL | INTRAVENOUS | Status: DC | PRN
  Administered 2023-10-16: 10 mL

## 2023-10-16 MED ORDER — MAGNESIUM SULFATE 2 GM/50ML IV SOLN
2.0000 g | Freq: Once | INTRAVENOUS | Status: AC
  Administered 2023-10-16: 2 g via INTRAVENOUS

## 2023-10-16 NOTE — Progress Notes (Signed)
 Attempted to call patient's mom this morning to see if we could send a ride early to start the magnesium infusion. Phone call went straight to voicemail and the mailbox is full, attempted to call again at a later time and call, again, went to voicemail. Attempted to call at 1030 to see if it was still going to voicemail, since the ride will be calling this number to let them know they are on the way. Call went to voicemail again.

## 2023-10-16 NOTE — Patient Instructions (Signed)
 Magnesium Sulfate Injection What is this medication? MAGNESIUM SULFATE (mag NEE zee um SUL fate) prevents and treats low levels of magnesium in your body. It may also be used to prevent and treat seizures during pregnancy in people with high blood pressure disorders, such as preeclampsia or eclampsia. Magnesium plays an important role in maintaining the health of your muscles and nervous system. This medicine may be used for other purposes; ask your health care provider or pharmacist if you have questions. What should I tell my care team before I take this medication? They need to know if you have any of these conditions: Heart disease History of irregular heart beat Kidney disease An unusual or allergic reaction to magnesium sulfate, medications, foods, dyes, or preservatives Pregnant or trying to get pregnant Breast-feeding How should I use this medication? This medication is for infusion into a vein. It is given in a hospital or clinic setting. Talk to your care team about the use of this medication in children. While this medication may be prescribed for selected conditions, precautions do apply. Overdosage: If you think you have taken too much of this medicine contact a poison control center or emergency room at once. NOTE: This medicine is only for you. Do not share this medicine with others. What if I miss a dose? This does not apply. What may interact with this medication? Certain medications for anxiety or sleep Certain medications for seizures, such phenobarbital Digoxin Medications that relax muscles for surgery Narcotic medications for pain This list may not describe all possible interactions. Give your health care provider a list of all the medicines, herbs, non-prescription drugs, or dietary supplements you use. Also tell them if you smoke, drink alcohol, or use illegal drugs. Some items may interact with your medicine. What should I watch for while using this  medication? Your condition will be monitored carefully while you are receiving this medication. You may need blood work done while you are receiving this medication. What side effects may I notice from receiving this medication? Side effects that you should report to your care team as soon as possible: Allergic reactions--skin rash, itching, hives, swelling of the face, lips, tongue, or throat High magnesium level--confusion, drowsiness, facial flushing, redness, sweating, muscle weakness, fast or irregular heartbeat, trouble breathing Low blood pressure--dizziness, feeling faint or lightheaded, blurry vision Side effects that usually do not require medical attention (report to your care team if they continue or are bothersome): Headache Nausea This list may not describe all possible side effects. Call your doctor for medical advice about side effects. You may report side effects to FDA at 1-800-FDA-1088. Where should I keep my medication? This medication is given in a hospital or clinic and will not be stored at home. NOTE: This sheet is a summary. It may not cover all possible information. If you have questions about this medicine, talk to your doctor, pharmacist, or health care provider.  2024 Elsevier/Gold Standard (2021-04-02 00:00:00)

## 2023-10-28 ENCOUNTER — Inpatient Hospital Stay (HOSPITAL_BASED_OUTPATIENT_CLINIC_OR_DEPARTMENT_OTHER): Payer: MEDICAID | Admitting: Hematology

## 2023-10-28 ENCOUNTER — Encounter: Payer: Self-pay | Admitting: Hematology

## 2023-10-28 ENCOUNTER — Inpatient Hospital Stay: Payer: MEDICAID

## 2023-10-28 VITALS — BP 100/75 | HR 91 | Temp 98.0°F | Resp 17 | Wt 150.2 lb

## 2023-10-28 DIAGNOSIS — C186 Malignant neoplasm of descending colon: Secondary | ICD-10-CM

## 2023-10-28 DIAGNOSIS — Z5112 Encounter for antineoplastic immunotherapy: Secondary | ICD-10-CM | POA: Diagnosis not present

## 2023-10-28 DIAGNOSIS — C189 Malignant neoplasm of colon, unspecified: Secondary | ICD-10-CM

## 2023-10-28 DIAGNOSIS — Z95828 Presence of other vascular implants and grafts: Secondary | ICD-10-CM

## 2023-10-28 LAB — CMP (CANCER CENTER ONLY)
ALT: 13 U/L (ref 0–44)
AST: 17 U/L (ref 15–41)
Albumin: 4.3 g/dL (ref 3.5–5.0)
Alkaline Phosphatase: 106 U/L (ref 38–126)
Anion gap: 6 (ref 5–15)
BUN: 8 mg/dL (ref 6–20)
CO2: 26 mmol/L (ref 22–32)
Calcium: 9.2 mg/dL (ref 8.9–10.3)
Chloride: 107 mmol/L (ref 98–111)
Creatinine: 0.67 mg/dL (ref 0.61–1.24)
GFR, Estimated: >60 mL/min (ref >60)
Glucose, Bld: 91 mg/dL (ref 70–99)
Potassium: 3.8 mmol/L (ref 3.5–5.1)
Sodium: 139 mmol/L (ref 135–145)
Total Bilirubin: 0.9 mg/dL (ref 0.0–1.2)
Total Protein: 7 g/dL (ref 6.5–8.1)

## 2023-10-28 LAB — CBC WITH DIFFERENTIAL (CANCER CENTER ONLY)
Abs Immature Granulocytes: 0 10*3/uL (ref 0.00–0.07)
Basophils Absolute: 0 10*3/uL (ref 0.0–0.1)
Basophils Relative: 1 %
Eosinophils Absolute: 0.1 10*3/uL (ref 0.0–0.5)
Eosinophils Relative: 3 %
HCT: 40.5 % (ref 39.0–52.0)
Hemoglobin: 13.3 g/dL (ref 13.0–17.0)
Immature Granulocytes: 0 %
Lymphocytes Relative: 25 %
Lymphs Abs: 0.9 10*3/uL (ref 0.7–4.0)
MCH: 27.7 pg (ref 26.0–34.0)
MCHC: 32.8 g/dL (ref 30.0–36.0)
MCV: 84.4 fL (ref 80.0–100.0)
Monocytes Absolute: 0.3 10*3/uL (ref 0.1–1.0)
Monocytes Relative: 8 %
Neutro Abs: 2.2 10*3/uL (ref 1.7–7.7)
Neutrophils Relative %: 63 %
Platelet Count: 298 10*3/uL (ref 150–400)
RBC: 4.8 MIL/uL (ref 4.22–5.81)
WBC Count: 3.5 10*3/uL — ABNORMAL LOW (ref 4.0–10.5)
nRBC: 0 % (ref 0.0–0.2)

## 2023-10-28 LAB — MAGNESIUM: Magnesium: 1.1 mg/dL — ABNORMAL LOW (ref 1.7–2.4)

## 2023-10-28 MED ORDER — SODIUM CHLORIDE 0.9% FLUSH: 10.0000 mL | INTRAVENOUS | Status: DC | PRN

## 2023-10-28 MED ORDER — SODIUM CHLORIDE 0.9 % IV SOLN
6.0000 mg/kg | Freq: Once | INTRAVENOUS | Status: AC
  Administered 2023-10-28: 400 mg via INTRAVENOUS
  Filled 2023-10-28: qty 20

## 2023-10-28 MED ORDER — SODIUM CHLORIDE 0.9 % IV SOLN
400.0000 mg/m2 | Freq: Once | INTRAVENOUS | Status: AC
  Administered 2023-10-28: 728 mg via INTRAVENOUS
  Filled 2023-10-28: qty 17.5

## 2023-10-28 MED ORDER — SODIUM CHLORIDE 0.9% FLUSH
10.0000 mL | Freq: Once | INTRAVENOUS | Status: AC
  Administered 2023-10-28: 10 mL

## 2023-10-28 MED ORDER — MAGNESIUM SULFATE 4 GM/100ML IV SOLN
4.0000 g | Freq: Once | INTRAVENOUS | Status: AC
  Administered 2023-10-28: 4 g via INTRAVENOUS
  Filled 2023-10-28: qty 100

## 2023-10-28 MED ORDER — SODIUM CHLORIDE 0.9 % IV SOLN
2400.0000 mg/m2 | INTRAVENOUS | Status: DC
  Administered 2023-10-28: 4350 mg via INTRAVENOUS
  Filled 2023-10-28: qty 87

## 2023-10-28 MED ORDER — SODIUM CHLORIDE 0.9 % IV SOLN: Freq: Once | INTRAVENOUS | Status: AC

## 2023-10-28 NOTE — Patient Instructions (Signed)
 [facility] - A DEPT OF [facility]  Discharge Instructions: Thank you for choosing [facility] to provide your oncology and hematology care.   If you have a lab appointment with the Cancer Center, please go directly to the Cancer Center and check in at the registration area.   Wear comfortable clothing and clothing appropriate for easy access to any Portacath or PICC line.   We strive to give you quality time with your provider. You may need to reschedule your appointment if you arrive late (15 or more minutes).  Arriving late affects you and other patients whose appointments are after yours.  Also, if you miss three or more appointments without notifying the office, you may be dismissed from the clinic at the provider's discretion.      For prescription refill requests, have your pharmacy contact our office and allow 72 hours for refills to be completed.    Today you received the following chemotherapy and/or immunotherapy agents: Vectibix, Leucovorin, 5FU      To help prevent nausea and vomiting after your treatment, we encourage you to take your nausea medication as directed.  BELOW ARE SYMPTOMS THAT SHOULD BE REPORTED IMMEDIATELY: *FEVER GREATER THAN 100.4 F (38 C) OR HIGHER *CHILLS OR SWEATING *NAUSEA AND VOMITING THAT IS NOT CONTROLLED WITH YOUR NAUSEA MEDICATION *UNUSUAL SHORTNESS OF BREATH *UNUSUAL BRUISING OR BLEEDING *URINARY PROBLEMS (pain or burning when urinating, or frequent urination) *BOWEL PROBLEMS (unusual diarrhea, constipation, pain near the anus) TENDERNESS IN MOUTH AND THROAT WITH OR WITHOUT PRESENCE OF ULCERS (sore throat, sores in mouth, or a toothache) UNUSUAL RASH, SWELLING OR PAIN  UNUSUAL VAGINAL DISCHARGE OR ITCHING   Items with * indicate a potential emergency and should be followed up as soon as possible or go to the Emergency Department if any problems should occur.  Please show the CHEMOTHERAPY ALERT CARD  or IMMUNOTHERAPY ALERT CARD at check-in to the Emergency Department and triage nurse.  Should you have questions after your visit or need to cancel or reschedule your appointment, please contact [facility] - A DEPT OF [facility]  Dept: [phone]  and follow the prompts.  Office hours are 8:00 a.m. to 4:30 p.m. Monday - Friday. Please note that voicemails left after 4:00 p.m. may not be returned until the following business day.  We are closed weekends and major holidays. You have access to a nurse at all times for urgent questions. Please call the main number to the clinic Dept: [phone] and follow the prompts.   For any non-urgent questions, you may also contact your provider using MyChart. We now offer e-Visits for anyone 43 and older to request care online for non-urgent symptoms. For details visit [email or web address].   Also download the MyChart app! Go to the app store, search "MyChart", open the app, select [facility], and log in with your MyChart username and password.  Hypomagnesemia Hypomagnesemia is a condition in which the level of magnesium in the blood is too low. Magnesium is a mineral that is found in many foods. It is used in many different processes in the body. Hypomagnesemia can affect every organ in the body. In severe cases, it can cause life-threatening problems. What are the causes? This condition may be caused by: Not getting enough magnesium in your diet or not having enough healthy foods to eat (malnutrition). Problems with magnesium absorption in the intestines. Dehydration. Excessive use of  alcohol. Vomiting. Severe or long-term (chronic) diarrhea. Some medicines, including medicines that make you urinate more often (diuretics). Certain diseases, such as kidney disease, diabetes, celiac disease, and overactive thyroid. What are the signs or symptoms? Symptoms of this condition include: Loss of appetite, nausea, and  vomiting. Involuntary shaking or trembling of a body part (tremor). Muscle weakness or tingling in the arms and legs. Sudden tightening of muscles (muscle spasms). Confusion. Psychiatric issues, such as: Depression and irritability. Psychosis. A feeling of fluttering of the heart (palpitations). Seizures. These symptoms are more severe if magnesium levels drop suddenly. How is this diagnosed? This condition may be diagnosed based on: Your symptoms and medical history. A physical exam. Blood and urine tests. How is this treated? Treatment depends on the cause and the severity of the condition. It may be treated by: Taking a magnesium supplement. This can be taken in pill form. If the condition is severe, magnesium is usually given through an IV. Making changes to your diet. You may be directed to eat foods that have a lot of magnesium, such as green leafy vegetables, peas, beans, and nuts. Not drinking alcohol. If you are struggling not to drink, ask your health care provider for help. Follow these instructions at home: Eating and drinking     Make sure that your diet includes foods with magnesium. Foods that have a lot of magnesium in them include: Green leafy vegetables, such as spinach and broccoli. Beans and peas. Nuts and seeds, such as almonds and sunflower seeds. Whole grains, such as whole grain bread and fortified cereals. Drink fluids that contain salts and minerals (electrolytes), such as sports drinks, when you are active. Do not drink alcohol. General instructions Take over-the-counter and prescription medicines only as told by your health care provider. Take magnesium supplements as directed if your health care provider tells you to take them. Have your magnesium levels monitored as told by your health care provider. Keep all follow-up visits. This is important. Contact a health care provider if: You get worse instead of better. Your symptoms return. Get help  right away if: You develop severe muscle weakness. You have trouble breathing. You feel that your heart is racing. These symptoms may represent a serious problem that is an emergency. Do not wait to see if the symptoms will go away. Get medical help right away. Call your local emergency services (911 in the [place] Do not drive yourself to the hospital. Summary Hypomagnesemia is a condition in which the level of magnesium in the blood is too low. Hypomagnesemia can affect every organ in the body. Treatment may include eating more foods that contain magnesium, taking magnesium supplements, and not drinking alcohol. Have your magnesium levels monitored as told by your health care provider. This information is not intended to replace advice given to you by your health care provider. Make sure you discuss any questions you have with your health care provider. Document Revised: 12/17/2020 Document Reviewed: 12/17/2020 Elsevier Patient Education  2024 [employer].

## 2023-10-28 NOTE — Assessment & Plan Note (Signed)
 stage IIIB p(T3, N1aM0), MSS, KRAS/NRAS/BRAF wild type, lung and node metastasis in 04/2022  -Initially diagnosed in 08/2021 -he completed 4 cycles adjuvant CAPOX 10/10/21 - 12/25/21 (though he somehow was still taking Xeloda through ~02/09/22 due to misunderstandings).  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 -we have changed his treatment to maintenance therapy with 5-fu and vectibix in late March 2024 -He is tolerating treatment very well, will continue. -due to metastasis in both lungs and retroperitoneal lymph nodes, he is not a candidate for surgery or local therapy  -restaging CT 10/06/2023 showed stable disease overall except increased size of the pulmonary nodule in the lingula now measuring 7 mm Will continue current therapy.

## 2023-10-28 NOTE — Progress Notes (Signed)
 [facility]   Telephone:(336) [phone] Fax:(336) [phone]   Clinic Follow up Note   [name] Care Team: Medicine, [facility] as PCP - General Berna Bue, MD as Consulting Physician (General Surgery) Malachy Mood, MD as Consulting Physician (Oncology)  Date of Service:  10/28/2023  CHIEF COMPLAINT: f/u of colon cancer  CURRENT THERAPY:  Maintenance 5-FU and Vectibix every 2 weeks  Oncology History   Cancer of left colon (HCC) stage IIIB p(T3, N1aM0), MSS, KRAS/NRAS/BRAF wild type, lung and node metastasis in 04/2022  -Initially diagnosed in 08/2021 -he completed 4 cycles adjuvant CAPOX 10/10/21 - 12/25/21 (though he somehow was still taking Xeloda through ~02/09/22 due to misunderstandings).  -He had Matthew metastatic recurrence in the lungs and retroperitoneal adenopathy in September 2023 -he started FOLFIRI on 05/08/2022, Vectibix added from cycle 2, tolerating well overall -restaging CT scan image from August 02, 2022 and 10/28/2022 both showed good partial response in pulmonary metastasis, no other new lesions. -we have changed his treatment to maintenance therapy with 5-fu and vectibix in late March 2024 -He is tolerating treatment very well, will continue. -due to metastasis in both lungs and retroperitoneal lymph nodes, he is not Matthew candidate for surgery or local therapy  -restaging CT 10/06/2023 showed stable disease overall except increased size of the pulmonary nodule in the lingula now measuring 7 mm Will continue current therapy.   Assessment and Plan    Metastatic colon cancer Metastatic colon cancer is well-managed with Matthew slight increase in size of one lung nodule, which remains small. The current treatment regimen is effective and well-tolerated. - Continue current treatment regimen  Schizophrenia He receives monthly paliperidone injections. There is concern about arm positioning, possibly related to the injection site. - Discuss arm  positioning and potential injection site issues with the prescribing doctor - Consider alternating injection sites if necessary  Plan -Restaging CT scan reviewed, overall stable disease -Lab reviewed, adequate for treatment, will proceed chemo today and continue every 2 weeks -Follow-up in 2 weeks     SUMMARY OF ONCOLOGIC HISTORY: Oncology History Overview Note   Cancer Staging  Cancer of left colon [facility]) Staging form: Colon and Rectum, AJCC 8th Edition - Pathologic stage from 08/08/2021: Stage IIIB (pT3, pN1a, cM0) - Signed by Malachy Mood, MD on 08/26/2021    Cancer of left colon (HCC)  04/01/2020 Imaging   IMPRESSION: 1. Gallbladder decompressed bowel also partially calcified gallstones. No pericholecystic inflammation though if there is persisting clinical concern for cholecystitis right upper quadrant ultrasound could be obtained. 2. Circumferential thickening of the distal thoracic esophagus. Could reflect features of esophagitis. Correlate with clinical symptoms and consider endoscopy as clinically warranted. 3. Additional segmental thickening of the mid to distal sigmoid with focal narrowing. No acute surrounding inflammation or resulting obstruction. Findings are nonspecific, and could reflect sequela of prior inflammation/infection. However, recommend correlation with colonoscopy if not recently performed. 4. Mild circumferential bladder wall thickening and indentation of the bladder base by an enlarged prostate. Possibly sequela of chronic outlet obstruction though could correlate with urinalysis to exclude cystitis. 5. Aortic Atherosclerosis (ICD10-I70.0).   08/06/2021 Imaging   IMPRESSION: Sigmoid colonic perforation with small free intraperitoneal gas and infiltration of the mesenteric and omental fat in keeping with changes of peritonitis.   Long segment inflammatory stranding of the sigmoid colon in keeping with Matthew severe infectious or inflammatory colitis. This  terminates an area of irregular mural thickening, infiltrative soft tissue within the colonic mesentery, and focal  dystrophic calcification. This may represent Matthew chronic inflammatory process, however, Matthew perforated malignancy could appear similarly. There are 2 separate points of perforation which again raise the question of an underlying malignancy.   7.1 cm gas and fluid containing pericolonic abscess within the sigmoid mesentery.   Marked inflammatory change of the terminal ileum adjacent to the pericolonic abscess with resultant small bowel obstruction. Fluid within the distal esophagus likely relates to gastroesophageal reflux the setting of vomiting.   Aortic Atherosclerosis (ICD10-I70.0).   08/08/2021 Cancer Staging   Staging form: Colon and Rectum, AJCC 8th Edition - Pathologic stage from 08/08/2021: Stage IIIB (pT3, pN1a, cM0) - Signed by Malachy Mood, MD on 08/26/2021 Stage prefix: Initial diagnosis Total positive nodes: 1 Histologic grading system: 4 grade system Histologic grade (G): G2 Residual tumor (R): R0 - None   08/08/2021 Definitive Surgery   FINAL MICROSCOPIC DIAGNOSIS:   Matthew. COLON, SIGMOID, PARTIAL COLECTOMY:  - Invasive moderately differentiated adenocarcinoma.  - Metastatic carcinoma involving one of twelve lymph nodes (1/12).  - See oncology table below.   ADDENDUM:  Mismatch Repair Protein ([facility])  SUMMARY INTERPRETATION: NORMAL    08/14/2021 Imaging   EXAM: CT ABDOMEN AND PELVIS WITH CONTRAST  IMPRESSION: 1. Post recent sigmoid colectomy with left lower quadrant colostomy. Two small residual foci of air within the pelvic mesentery with mild adjacent thickening, but no abscess or drainable collection. Trace non organized free fluid and stranding in the pelvis. 2. Short segment of small bowel wall thickening and inflammation in the pelvis involving the distal ileum, likely reactive. 3. Dilated distal esophagus, stomach, and small bowel, without discrete transition  point, favoring postoperative ileus. 4. Small bilateral pleural effusions and compressive atelectasis. 5. Heterogeneous partially enhancing 14 mm lymph node in the retroperitoneum at the aortoiliac bifurcation, not significantly changed from prior exam. Suspected additional lymph nodes in the anterior common iliac space, not significantly changed from prior exam. Recommend attention at follow-up. 6. Additional chronic findings as described.     08/15/2021 Imaging   EXAM: CT CHEST WITH CONTRAST  IMPRESSION: 1. No evidence of thoracic metastasis. 2. Bilateral small layering pleural effusions with passive atelectasis   08/26/2021 Initial Diagnosis   Cancer of left colon (HCC)   10/10/2021 - 12/12/2021 Chemotherapy   [name] is on Treatment Plan : COLORECTAL Xelox (Capeox) q21d     05/28/2022 - 05/28/2022 Chemotherapy   [name] is on Treatment Plan : COLORECTAL FOLFIRI + Bevacizumab q14d     05/28/2022 -  Chemotherapy   [name] is on Treatment Plan : COLORECTAL FOLFIRI + Panitumumab q14d     07/31/2022 Imaging    IMPRESSION: Decreased bilateral pulmonary metastases.   Stable mild abdominal lymphadenopathy.   No new or progressive metastatic disease within the chest, abdomen, or pelvis.   01/18/2023 Imaging    IMPRESSION: 1. Status post Gertie Gowda pouch sigmoid colon resection with left lower quadrant end colostomy and rectal stump. Unchanged, mild wall thickening of the decompressed distal colon at the ileostomy, as well as the rectal stump. 2. Unchanged small, treated metastatic pulmonary nodules. No new nodules. 3. Unchanged enlarged, coarsely calcified treated lower aortocaval metastatic lymph nodes. No new lymphadenopathy. 4. Coronary artery disease. 5. Cholelithiasis.   04/23/2023 Imaging   CT chest, abdomen, and pelvis with contrast  IMPRESSION: 1. Left lower lobe segmental pulmonary emboli, incidental finding. No evidence for right heart strain. 2. Stable trace  bilateral pleural effusions. 3. Stable enlarged lower left paraesophageal lymph node. 4. Stable calcified retroperitoneal lymph nodes. No  new enlarged lymph nodes in the chest, abdomen or pelvis. 5. Stable subcentimeter soft tissue nodules within the mesenteric fat along the inferior ostomy. 6. Left Bosniak I benign renal cyst measuring 4.6 cm. No follow-up imaging is recommended. JACR 2018 Feb; 264-273, Management of the Incidental Renal Mass on CT, [employer] 2021; 814-848, Bosniak Classification of Cystic Renal Masses, Version 2019.   07/15/2023 Imaging   CT chest abdomen and pelvis with contrast IMPRESSION: 1. Prior partial left hemicolectomy with end colostomy in the left anterior abdominal wall. Unchanged mild wall thickening of the decompressed distal colon at the ileostomy as well as the rectal stump/Hartmann's pouch. 2. Stable enlarged left lower paraesophageal lymph node. 3. Increased size of the treated pulmonary nodule in the lingula now measuring 6 mm previously 4 mm with increased in size favored artifactual given motion degrading examination of the lung bases. Suggest attention on follow-up imaging 4. Unchanged size of the treated pulmonary nodule in the right middle lobe measuring 4 mm. 5. Increased size of prominent bilateral axillary lymph nodes measuring up to 7 mm in short axis, nonspecific. Suggest attention on follow-up imaging 6. No convincing evidence of new or progressive disease in the chest, abdomen or pelvis. 7.  Aortic Atherosclerosis (ICD10-I70.0).     10/06/2023 Imaging   CT chest abdomen pelvis with contrast IMPRESSION: 1. Increased size of the pulmonary nodule in the lingula now measuring 7 mm, concerning for pulmonary metastatic disease recurrence. 2. Additional bilateral pulmonary nodules are stable. 3. Stable prominent bilateral axillary, low paraesophageal and retroperitoneal lymph nodes. 4. Prior partial left hemicolectomy with Hartmann's pouch formation  and left anterior abdominal wall colostomy. No suspicious nodularity along the Hartmann's suture line. 5. Patulous esophagus with symmetric distal esophageal wall thickening, similar prior. 6. Trace right pleural effusion. 7.  Aortic Atherosclerosis (ICD10-I70.0).        Discussed the use of AI scribe software for clinical note transcription with the [name], who gave verbal consent to proceed.  History of Present Illness   Matthew 58 year old [name] with Matthew known diagnosis of metastatic colon cancer presents for Matthew routine follow-up visit. The [name] reports no new symptoms or discomfort and maintains Matthew good appetite. He denies any rash on the face or chest, diarrhea, or pain. The [name]'s energy level is reported as satisfactory, and he engages in light activities at home, such as watching TV and going outside. The [name] is on Matthew regimen of oral magnesium taken three times Matthew day and potassium once Matthew day. He also receives Matthew monthly injection for mood management related to Matthew diagnosis of schizophrenia. The [name]'s family member notes that the [name] has been holding up his arm, where the injection is administered, which could be Matthew side effect of the injection.         All other systems were reviewed with the [name] and are negative.  MEDICAL HISTORY:  Past Medical History:  Diagnosis Date   Cancer (HCC)    Schizophrenia [facility])     SURGICAL HISTORY: Past Surgical History:  Procedure Laterality Date   BRONCHIAL BIOPSY  05/11/2022   Procedure: BRONCHIAL BIOPSIES;  Surgeon: Leslye Peer, MD;  Location: [facility] ENDOSCOPY;  Service: Pulmonary;;   BRONCHIAL BRUSHINGS  05/11/2022   Procedure: BRONCHIAL BRUSHINGS;  Surgeon: Leslye Peer, MD;  Location: [facility] ENDOSCOPY;  Service: Pulmonary;;   BRONCHIAL NEEDLE ASPIRATION BIOPSY  05/11/2022   Procedure: BRONCHIAL NEEDLE ASPIRATION BIOPSIES;  Surgeon: Leslye Peer, MD;  Location: [facility] ENDOSCOPY;  Service: Pulmonary;;  BRONCHIAL WASHINGS  05/11/2022    Procedure: BRONCHIAL WASHINGS;  Surgeon: Leslye Peer, MD;  Location: [facility] ENDOSCOPY;  Service: Pulmonary;;   IR IMAGING GUIDED PORT INSERTION  05/25/2022   LAPAROTOMY N/Matthew 08/08/2021   Procedure: EXPLORATORY LAPAROTOMY;  Surgeon: Berna Bue, MD;  Location: [facility] OR;  Service: General;  Laterality: N/Matthew;   PARTIAL COLECTOMY N/Matthew 08/08/2021   Procedure: PARTIAL COLECTOMY WITH COLOSTOMY;  Surgeon: Berna Bue, MD;  Location: [facility] OR;  Service: General;  Laterality: N/Matthew;   VIDEO BRONCHOSCOPY WITH RADIAL ENDOBRONCHIAL ULTRASOUND  05/11/2022   Procedure: VIDEO BRONCHOSCOPY WITH RADIAL ENDOBRONCHIAL ULTRASOUND;  Surgeon: Leslye Peer, MD;  Location: [facility] ENDOSCOPY;  Service: Pulmonary;;    I have reviewed the social history and family history with the [name] and they are unchanged from previous note.  ALLERGIES:  has no known allergies.  MEDICATIONS:  Current Outpatient Medications  Medication Sig Dispense Refill   acetaminophen (TYLENOL) 500 MG tablet Take 2 tablets (1,000 mg total) by mouth every 6 (six) hours as needed for mild pain or moderate pain.  0   apixaban (ELIQUIS) 5 MG TABS tablet Take 1 tablet (5 mg total) by mouth 2 (two) times daily. 60 tablet 2   aspirin EC 81 MG tablet Take 81 mg by mouth daily as needed (for pain or headaches).      benztropine (COGENTIN) 1 MG tablet Take 1 mg at bedtime by mouth.     clindamycin (CLINDAGEL) 1 % gel Apply topically 2 (two) times daily. To skin rash on face and upper body 30 g 0   ferrous sulfate 325 (65 FE) MG EC tablet Take 1 tablet (325 mg total) by mouth daily. 30 tablet 3   hydrocortisone cream 1 % Apply 1 Application topically 2 (two) times daily as needed for itching. For rash 30 g 2   loperamide (IMODIUM) 2 MG capsule Take 1 capsule (2 mg total) by mouth as needed for diarrhea or loose stools. 30 capsule 0   magnesium oxide (MAG-OX) 400 (240 Mg) MG tablet Take 1 tablet (400 mg total) by mouth daily. 30 tablet 2   Multiple Vitamin  (MULTIVITAMIN WITH MINERALS) TABS tablet Take 1 tablet by mouth daily.     ondansetron (ZOFRAN) 8 MG tablet Take 1 tablet (8 mg total) by mouth every 8 (eight) hours as needed for nausea or vomiting. 30 tablet 0   paliperidone (INVEGA SUSTENNA) 156 MG/ML SUSP injection Inject 156 mg every 30 (thirty) days into the muscle.     polyethylene glycol (MIRALAX / GLYCOLAX) 17 g packet Take 17 g by mouth daily as needed for mild constipation or moderate constipation.  0   potassium chloride SA (KLOR-CON M) 20 MEQ tablet Take 1 tablet (20 mEq total) by mouth daily. 30 tablet 2   prochlorperazine (COMPAZINE) 10 MG tablet TAKE 1 TABLET(10 MG) BY MOUTH EVERY 6 HOURS AS NEEDED FOR NAUSEA OR VOMITING 30 tablet 0   No current facility-administered medications for this visit.   Facility-Administered Medications Ordered in Other Visits  Medication Dose Route Frequency Provider Last Rate Last Admin   fluorouracil (ADRUCIL) 4,350 mg in sodium chloride 0.9 % 63 mL chemo infusion  2,400 mg/m2 (Treatment Plan Recorded) Intravenous 1 day or 1 dose Malachy Mood, MD   4,350 mg at 10/28/23 1619   sodium chloride flush (NS) 0.9 % injection 10 mL  10 mL Intracatheter PRN Malachy Mood, MD        PHYSICAL EXAMINATION: ECOG PERFORMANCE STATUS:  1 - Symptomatic but completely ambulatory  Vitals:   10/28/23 1151  BP: 100/75  Pulse: 91  Resp: 17  Temp: 98 F (36.7 C)  SpO2: 93%   Wt Readings from Last 3 Encounters:  10/28/23 150 lb 3.2 oz (68.1 kg)  10/14/23 151 lb 1.6 oz (68.5 kg)  09/30/23 149 lb 12 oz (67.9 kg)     GENERAL:alert, no distress and comfortable SKIN: skin color, texture, turgor are normal, no rashes or significant lesions EYES: normal, Conjunctiva are pink and non-injected, sclera clear NECK: supple, thyroid normal size, non-tender, without nodularity LYMPH:  no palpable lymphadenopathy in the cervical, axillary  LUNGS: clear to auscultation and percussion with normal breathing effort HEART: regular  rate & rhythm and no murmurs and no lower extremity edema ABDOMEN:abdomen soft, non-tender and normal bowel sounds Musculoskeletal:no cyanosis of digits and no clubbing  NEURO: alert & oriented x 3 with fluent speech, no focal motor/sensory deficits   LABORATORY DATA:  I have reviewed the data as listed    Latest Ref Rng & Units 10/28/2023   10:53 AM 10/14/2023   11:30 AM 09/30/2023   11:57 AM  CBC  WBC 4.0 - 10.5 K/uL 3.5  4.5  5.2   Hemoglobin 13.0 - 17.0 g/dL 16.1  09.6  04.5   Hematocrit 39.0 - 52.0 % 40.5  37.3  31.5   Platelets 150 - 400 K/uL 298  311  318         Latest Ref Rng & Units 10/28/2023   10:53 AM 10/14/2023   11:30 AM 09/30/2023   11:57 AM  CMP  Glucose 70 - 99 mg/dL 91  89  88   BUN 6 - 20 mg/dL 8  9  13    Creatinine 0.61 - 1.24 mg/dL 4.09  8.11  9.14   Sodium 135 - 145 mmol/L 139  139  137   Potassium 3.5 - 5.1 mmol/L 3.8  3.6  3.6   Chloride 98 - 111 mmol/L 107  107  108   CO2 22 - 32 mmol/L 26  26  25    Calcium 8.9 - 10.3 mg/dL 9.2  8.5  8.7   Total Protein 6.5 - 8.1 g/dL 7.0  6.9  6.6   Total Bilirubin 0.0 - 1.2 mg/dL 0.9  0.7  0.5   Alkaline Phos 38 - 126 U/L 106  116  86   AST 15 - 41 U/L 17  21  15    ALT 0 - 44 U/L 13  21  11        RADIOGRAPHIC STUDIES: I have personally reviewed the radiological images as listed and agreed with the findings in the report. No results found.    Orders Placed This Encounter  Procedures   CBC with Differential (Cancer Center Only)    Standing Status:   Future    Expected Date:   11/11/2023    Expiration Date:   11/10/2024   CMP (Cancer Center only)    Standing Status:   Future    Expected Date:   11/11/2023    Expiration Date:   11/10/2024   Magnesium    Standing Status:   Future    Expected Date:   11/11/2023    Expiration Date:   11/10/2024   CBC with Differential (Cancer Center Only)    Standing Status:   Future    Expected Date:   11/25/2023    Expiration Date:   11/24/2024   CMP (Cancer Center only)  Standing Status:   Future    Expected Date:   11/25/2023    Expiration Date:   11/24/2024   Magnesium    Standing Status:   Future    Expected Date:   11/25/2023    Expiration Date:   11/24/2024   CBC with Differential (Cancer Center Only)    Standing Status:   Future    Expected Date:   12/09/2023    Expiration Date:   12/08/2024   CMP (Cancer Center only)    Standing Status:   Future    Expected Date:   12/09/2023    Expiration Date:   12/08/2024   Magnesium    Standing Status:   Future    Expected Date:   12/09/2023    Expiration Date:   12/08/2024   CBC with Differential (Cancer Center Only)    Standing Status:   Future    Expected Date:   12/23/2023    Expiration Date:   12/22/2024   CMP (Cancer Center only)    Standing Status:   Future    Expected Date:   12/23/2023    Expiration Date:   12/22/2024   Magnesium    Standing Status:   Future    Expected Date:   12/23/2023    Expiration Date:   12/22/2024   All questions were answered. The [name] knows to call the clinic with any problems, questions or concerns. No barriers to learning was detected. The total time spent in the appointment was 25 minutes.     Malachy Mood, MD 10/28/2023

## 2023-10-30 ENCOUNTER — Inpatient Hospital Stay: Payer: MEDICAID

## 2023-10-30 ENCOUNTER — Emergency Department (HOSPITAL_COMMUNITY)
Admission: EM | Admit: 2023-10-30 | Discharge: 2023-10-30 | Disposition: A | Discharge status: Home / Self Care | Payer: MEDICAID | Attending: Emergency Medicine | Admitting: Emergency Medicine

## 2023-10-30 ENCOUNTER — Other Ambulatory Visit: Payer: Self-pay

## 2023-10-30 ENCOUNTER — Encounter (HOSPITAL_COMMUNITY): Payer: Self-pay

## 2023-10-30 DIAGNOSIS — Z85038 Personal history of other malignant neoplasm of large intestine: Secondary | ICD-10-CM | POA: Diagnosis not present

## 2023-10-30 DIAGNOSIS — Z85118 Personal history of other malignant neoplasm of bronchus and lung: Secondary | ICD-10-CM | POA: Diagnosis not present

## 2023-10-30 DIAGNOSIS — Z7982 Long term (current) use of aspirin: Secondary | ICD-10-CM | POA: Diagnosis not present

## 2023-10-30 DIAGNOSIS — Z452 Encounter for adjustment and management of vascular access device: Secondary | ICD-10-CM | POA: Diagnosis present

## 2023-10-30 DIAGNOSIS — Z7901 Long term (current) use of anticoagulants: Secondary | ICD-10-CM | POA: Insufficient documentation

## 2023-10-30 MED ORDER — HEPARIN SOD (PORK) LOCK FLUSH 100 UNIT/ML IV SOLN
INTRAVENOUS | Status: AC
  Filled 2023-10-30: qty 5

## 2023-10-30 NOTE — ED Triage Notes (Signed)
 Pt sent to cancer center to have his port de-accessed but no one is there. No complaints.

## 2023-10-30 NOTE — ED Provider Notes (Signed)
  EMERGENCY DEPARTMENT AT [facility] Provider Note   CSN: [id number] Arrival date & time: 10/30/23  1507     History {Add pertinent medical, surgical, social history, OB history to HPI:1} Chief Complaint  Patient presents with   Vascular Access Problem    Matthew [name] is a 58 y.o. male.  Patient presents stating he needs his port deaccessed.  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  Was at the hematologist office yesterday.  Otherwise has no complaints.  He has a history of schizophrenia and tells me he was post come here today to get the port deaccessed.  HPI     Home Medications Prior to Admission medications   Medication Sig Start Date End Date Taking? Authorizing Provider  acetaminophen (TYLENOL) 500 MG tablet Take 2 tablets (1,000 mg total) by mouth every 6 (six) hours as needed for mild pain or moderate pain. 08/22/21   Eric Form, PA-C  apixaban (ELIQUIS) 5 MG TABS tablet Take 1 tablet (5 mg total) by mouth 2 (two) times daily. 10/14/23   Carlean Jews, NP  aspirin EC 81 MG tablet Take 81 mg by mouth daily as needed (for pain or headaches).     [provider]  benztropine (COGENTIN) 1 MG tablet Take 1 mg at bedtime by mouth.    [provider]  clindamycin (CLINDAGEL) 1 % gel Apply topically 2 (two) times daily. To skin rash on face and upper body 06/10/22   Malachy Mood, MD  ferrous sulfate 325 (65 FE) MG EC tablet Take 1 tablet (325 mg total) by mouth daily. 07/30/23   Pollyann Samples, NP  hydrocortisone cream 1 % Apply 1 Application topically 2 (two) times daily as needed for itching. For rash 06/24/22   Heilingoetter, Cassandra L, PA-C  loperamide (IMODIUM) 2 MG capsule Take 1 capsule (2 mg total) by mouth as needed for diarrhea or loose stools. 11/25/22   Pollyann Samples, NP  magnesium oxide (MAG-OX) 400 (240 Mg) MG tablet Take 1 tablet (400 mg total) by mouth daily. 09/01/23   Malachy Mood, MD  Multiple  Vitamin (MULTIVITAMIN WITH MINERALS) TABS tablet Take 1 tablet by mouth daily. 08/23/21   Eric Form, PA-C  ondansetron (ZOFRAN) 8 MG tablet Take 1 tablet (8 mg total) by mouth every 8 (eight) hours as needed for nausea or vomiting. 08/19/22   Malachy Mood, MD  paliperidone (INVEGA SUSTENNA) 156 MG/ML SUSP injection Inject 156 mg every 30 (thirty) days into the muscle.    [provider]  polyethylene glycol (MIRALAX / GLYCOLAX) 17 g packet Take 17 g by mouth daily as needed for mild constipation or moderate constipation. 08/22/21   Eric Form, PA-C  potassium chloride SA (KLOR-CON M) 20 MEQ tablet Take 1 tablet (20 mEq total) by mouth daily. 07/22/23   Pollyann Samples, NP  prochlorperazine (COMPAZINE) 10 MG tablet TAKE 1 TABLET(10 MG) BY MOUTH EVERY 6 HOURS AS NEEDED FOR NAUSEA OR VOMITING 11/25/22   Pollyann Samples, NP      Allergies    Patient has no known allergies.    Review of Systems   Review of Systems  Physical Exam Updated Vital Signs BP 103/72 (BP Location: Right Arm)   Pulse 87   Temp 98.2 F (36.8 C) (Oral)   Resp 18   Ht 5\' 9"  (1.753 m)   Wt 68 kg   SpO2 100%   BMI 22.15 kg/m  Physical Exam Vitals and nursing note reviewed.  Constitutional:      Appearance: Normal appearance. He is well-developed.  HENT:     Head: Normocephalic and atraumatic.  Eyes:     Conjunctiva/sclera: Conjunctivae normal.  Cardiovascular:     Rate and Rhythm: Normal rate and regular rhythm.  Pulmonary:     Effort: Pulmonary effort is normal.     Comments: He has a port with a catheter accessing it.  No signs of infection Musculoskeletal:     Cervical back: Neck supple.  Skin:    General: Skin is warm and dry.  Neurological:     Mental Status: He is alert.     GCS: GCS eye subscore is 4. GCS verbal subscore is 5. GCS motor subscore is 6.     ED Results / Procedures / Treatments   Labs (all labs ordered are listed, but only abnormal results are displayed) Labs  Reviewed - No data to display  EKG None  Radiology No results found.  Procedures Procedures  {Document cardiac monitor, telemetry assessment procedure when appropriate:1}  Medications Ordered in ED Medications  heparin lock flush 100 UNIT/ML injection (has no administration in time range)    ED Course/ Medical Decision Making/ A&P   {   Click here for ABCD2, HEART and other calculatorsREFRESH Note before signing :1}                              Medical Decision Making  ***  {Document critical care time when appropriate:1} {Document review of labs and clinical decision tools ie heart score, Chads2Vasc2 etc:1}  {Document your independent review of radiology images, and any outside records:1} {Document your discussion with family members, caretakers, and with consultants:1} {Document social determinants of health affecting pt's care:1} {Document your decision making why or why not admission, treatments were needed:1} Final Clinical Impression(s) / ED Diagnoses Final diagnoses:  None    Rx / DC Orders ED Discharge Orders     None

## 2023-10-30 NOTE — Progress Notes (Unsigned)
 Patient missed his appointment for pump d/c and mag. On [employer], he missed his ride. Attempted to call number on file 4 times with no answer, and unable to leave a voicemail as the mailbox is full.

## 2023-10-31 ENCOUNTER — Other Ambulatory Visit: Payer: Self-pay

## 2023-11-08 ENCOUNTER — Other Ambulatory Visit: Payer: Self-pay

## 2023-11-10 ENCOUNTER — Telehealth: Payer: Self-pay | Admitting: Hematology

## 2023-11-11 ENCOUNTER — Other Ambulatory Visit: Payer: Self-pay

## 2023-11-17 ENCOUNTER — Other Ambulatory Visit: Payer: Self-pay

## 2023-11-17 NOTE — Assessment & Plan Note (Signed)
 stage IIIB p(T3, N1aM0), MSS, KRAS/NRAS/BRAF wild type, lung and node metastasis in 04/2022  -Initially diagnosed in 08/2021 -he completed 4 cycles adjuvant CAPOX 10/10/21 - 12/25/21 (though he somehow was still taking Xeloda through ~02/09/22 due to misunderstandings).  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 -we have changed his treatment to maintenance therapy with 5-fu and vectibix in late March 2024 -He is tolerating treatment very well, will continue. -due to metastasis in both lungs and retroperitoneal lymph nodes, he is not a candidate for surgery or local therapy  -restaging CT 10/06/2023 showed stable disease overall except increased size of the pulmonary nodule in the lingula now measuring 7 mm Will continue current therapy.

## 2023-11-18 ENCOUNTER — Inpatient Hospital Stay (HOSPITAL_BASED_OUTPATIENT_CLINIC_OR_DEPARTMENT_OTHER): Payer: MEDICAID | Admitting: Hematology

## 2023-11-18 ENCOUNTER — Inpatient Hospital Stay: Payer: MEDICAID | Attending: Hematology

## 2023-11-18 ENCOUNTER — Inpatient Hospital Stay: Payer: MEDICAID

## 2023-11-18 VITALS — HR 94

## 2023-11-18 VITALS — BP 102/60 | HR 102 | Temp 98.2°F | Resp 17 | Wt 150.1 lb

## 2023-11-18 DIAGNOSIS — Z5111 Encounter for antineoplastic chemotherapy: Secondary | ICD-10-CM | POA: Insufficient documentation

## 2023-11-18 DIAGNOSIS — Z79631 Long term (current) use of antimetabolite agent: Secondary | ICD-10-CM | POA: Insufficient documentation

## 2023-11-18 DIAGNOSIS — C186 Malignant neoplasm of descending colon: Secondary | ICD-10-CM

## 2023-11-18 DIAGNOSIS — Z79899 Other long term (current) drug therapy: Secondary | ICD-10-CM | POA: Diagnosis not present

## 2023-11-18 DIAGNOSIS — C7802 Secondary malignant neoplasm of left lung: Secondary | ICD-10-CM | POA: Insufficient documentation

## 2023-11-18 DIAGNOSIS — D500 Iron deficiency anemia secondary to blood loss (chronic): Secondary | ICD-10-CM

## 2023-11-18 DIAGNOSIS — Z5112 Encounter for antineoplastic immunotherapy: Secondary | ICD-10-CM | POA: Diagnosis present

## 2023-11-18 DIAGNOSIS — C7801 Secondary malignant neoplasm of right lung: Secondary | ICD-10-CM | POA: Insufficient documentation

## 2023-11-18 DIAGNOSIS — C189 Malignant neoplasm of colon, unspecified: Secondary | ICD-10-CM

## 2023-11-18 DIAGNOSIS — Z95828 Presence of other vascular implants and grafts: Secondary | ICD-10-CM

## 2023-11-18 DIAGNOSIS — C772 Secondary and unspecified malignant neoplasm of intra-abdominal lymph nodes: Secondary | ICD-10-CM | POA: Insufficient documentation

## 2023-11-18 LAB — CMP (CANCER CENTER ONLY)
ALT: 26 U/L (ref 0–44)
AST: 25 U/L (ref 15–41)
Albumin: 4.2 g/dL (ref 3.5–5.0)
Alkaline Phosphatase: 117 U/L (ref 38–126)
Anion gap: 6 (ref 5–15)
BUN: 8 mg/dL (ref 6–20)
CO2: 26 mmol/L (ref 22–32)
Calcium: 9.3 mg/dL (ref 8.9–10.3)
Chloride: 108 mmol/L (ref 98–111)
Creatinine: 0.71 mg/dL (ref 0.61–1.24)
GFR, Estimated: >60 mL/min (ref >60)
Glucose, Bld: 148 mg/dL — ABNORMAL HIGH (ref 70–99)
Potassium: 3.5 mmol/L (ref 3.5–5.1)
Sodium: 140 mmol/L (ref 135–145)
Total Bilirubin: 0.8 mg/dL (ref 0.0–1.2)
Total Protein: 6.9 g/dL (ref 6.5–8.1)

## 2023-11-18 LAB — CBC WITH DIFFERENTIAL (CANCER CENTER ONLY)
Abs Immature Granulocytes: 0.01 10*3/uL (ref 0.00–0.07)
Basophils Absolute: 0 10*3/uL (ref 0.0–0.1)
Basophils Relative: 0 %
Eosinophils Absolute: 0 10*3/uL (ref 0.0–0.5)
Eosinophils Relative: 1 %
HCT: 40.4 % (ref 39.0–52.0)
Hemoglobin: 13.4 g/dL (ref 13.0–17.0)
Immature Granulocytes: 0 %
Lymphocytes Relative: 14 %
Lymphs Abs: 0.8 10*3/uL (ref 0.7–4.0)
MCH: 28.2 pg (ref 26.0–34.0)
MCHC: 33.2 g/dL (ref 30.0–36.0)
MCV: 85.1 fL (ref 80.0–100.0)
Monocytes Absolute: 0.3 10*3/uL (ref 0.1–1.0)
Monocytes Relative: 5 %
Neutro Abs: 4.3 10*3/uL (ref 1.7–7.7)
Neutrophils Relative %: 80 %
Platelet Count: 287 10*3/uL (ref 150–400)
RBC: 4.75 MIL/uL (ref 4.22–5.81)
RDW: 20.7 % — ABNORMAL HIGH (ref 11.5–15.5)
WBC Count: 5.4 10*3/uL (ref 4.0–10.5)
nRBC: 0 % (ref 0.0–0.2)

## 2023-11-18 LAB — FERRITIN: Ferritin: 50 ng/mL (ref 24–336)

## 2023-11-18 LAB — MAGNESIUM: Magnesium: 1.5 mg/dL — ABNORMAL LOW (ref 1.7–2.4)

## 2023-11-18 MED ORDER — SODIUM CHLORIDE 0.9 % IV SOLN
400.0000 mg/m2 | Freq: Once | INTRAVENOUS | Status: AC
  Administered 2023-11-18: 728 mg via INTRAVENOUS
  Filled 2023-11-18: qty 25

## 2023-11-18 MED ORDER — SODIUM CHLORIDE 0.9 % IV SOLN: Freq: Once | INTRAVENOUS | Status: AC

## 2023-11-18 MED ORDER — SODIUM CHLORIDE 0.9 % IV SOLN
2400.0000 mg/m2 | INTRAVENOUS | Status: DC
  Administered 2023-11-18: 4350 mg via INTRAVENOUS
  Filled 2023-11-18: qty 87

## 2023-11-18 MED ORDER — SODIUM CHLORIDE 0.9 % IV SOLN
6.0000 mg/kg | Freq: Once | INTRAVENOUS | Status: AC
  Administered 2023-11-18: 400 mg via INTRAVENOUS
  Filled 2023-11-18: qty 20

## 2023-11-18 MED ORDER — SODIUM CHLORIDE 0.9% FLUSH
10.0000 mL | Freq: Once | INTRAVENOUS | Status: AC
  Administered 2023-11-18: 10 mL

## 2023-11-18 MED ORDER — MAGNESIUM SULFATE 4 GM/100ML IV SOLN
4.0000 g | Freq: Once | INTRAVENOUS | Status: AC
  Administered 2023-11-18: 4 g via INTRAVENOUS
  Filled 2023-11-18: qty 100

## 2023-11-18 NOTE — Progress Notes (Signed)
 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  -He had a metastatic recurrence in the lungs and retroperitoneal adenopathy in September 2023 -he started FOLFIRI on 05/08/2022, Vectibix  added from cycle 2, tolerating well overall -restaging CT scan image from August 02, 2022 and 10/28/2022 both showed good partial response in pulmonary metastasis, no other new lesions. -we have changed his treatment to maintenance therapy with 5-fu and vectibix  in late March 2024 -He is tolerating treatment very well, will continue. -due to metastasis in both lungs and retroperitoneal lymph nodes, he is not a candidate for surgery or local therapy  -restaging CT 10/06/2023 showed stable disease overall except increased size of the pulmonary nodule in the lingula now measuring 7 mm Will continue current therapy.    Assessment & Plan Colon Cancer Undergoing treatment for colon cancer. Reports no adverse effects from the last treatment, no pain or discomfort, and maintains adequate nutritional intake. Weight is stable at 150 pounds. The last scan on March 5th showed no new findings. Treatment continuation is contingent upon pending lab results. - Proceed with treatment if lab results are satisfactory - Review lab results when available  Plan -  Lab reviewed, adequate for treatment, will proceed with chemotherapy today and continue every 2 weeks - I spoke with his mother today - Follow-up in 2 weeks   SUMMARY OF ONCOLOGIC HISTORY: Oncology History Overview Note   Cancer Staging  Cancer of left colon [facility]) Staging form: Colon and Rectum, AJCC 8th Edition - Pathologic stage from 08/08/2021: Stage IIIB (pT3, pN1a, cM0) - Signed by Sonja Austinburg, MD on 08/26/2021    Cancer of left colon (HCC)  04/01/2020 Imaging   IMPRESSION: 1. Gallbladder decompressed bowel also partially calcified gallstones. No pericholecystic inflammation though if there is persisting clinical concern for cholecystitis right upper quadrant ultrasound could be obtained. 2. Circumferential thickening of the distal thoracic esophagus. Could reflect features of esophagitis. Correlate with clinical symptoms and consider endoscopy as clinically warranted. 3. Additional segmental thickening of the mid to distal sigmoid with focal narrowing. No acute surrounding inflammation or resulting obstruction. Findings are nonspecific, and could reflect sequela of prior inflammation/infection. However, recommend correlation with colonoscopy if not recently performed. 4. Mild circumferential bladder wall thickening and indentation of the bladder base by an enlarged prostate. Possibly sequela of chronic outlet obstruction though could correlate with urinalysis to exclude cystitis. 5. Aortic Atherosclerosis (ICD10-I70.0).   08/06/2021 Imaging   IMPRESSION: Sigmoid colonic perforation with small free intraperitoneal gas and infiltration of the mesenteric and omental fat in keeping with changes of peritonitis.   Long segment inflammatory stranding of the sigmoid colon in keeping with a severe infectious or inflammatory colitis. This terminates an area of irregular mural thickening, infiltrative soft tissue within the colonic mesentery, and focal dystrophic calcification. This may  represent a chronic inflammatory process, however, a perforated malignancy  could appear similarly. There are 2 separate points of perforation which again raise the question of an underlying malignancy.   7.1 cm gas and fluid containing pericolonic abscess within the sigmoid mesentery.   Marked inflammatory change of the terminal ileum adjacent to the pericolonic abscess with resultant small bowel obstruction. Fluid within the distal esophagus likely relates to gastroesophageal reflux the setting of vomiting.   Aortic Atherosclerosis (ICD10-I70.0).   08/08/2021 Cancer Staging   Staging form: Colon and Rectum, AJCC 8th Edition - Pathologic stage from 08/08/2021: Stage IIIB (pT3, pN1a, cM0) - Signed by Sonja Hunterdon, MD on 08/26/2021 Stage prefix: Initial diagnosis Total positive nodes: 1 Histologic grading system: 4 grade system Histologic grade (G): G2 Residual tumor (R): R0 - None   08/08/2021 Definitive Surgery   FINAL MICROSCOPIC DIAGNOSIS:   A. COLON, SIGMOID, PARTIAL COLECTOMY:  - Invasive moderately differentiated adenocarcinoma.  - Metastatic carcinoma involving one of twelve lymph nodes (1/12).  - See oncology table below.   ADDENDUM:  Mismatch Repair Protein (IHC)  SUMMARY INTERPRETATION: NORMAL    08/14/2021 Imaging   EXAM: CT ABDOMEN AND PELVIS WITH CONTRAST  IMPRESSION: 1. Post recent sigmoid colectomy with left lower quadrant colostomy. Two small residual foci of air within the pelvic mesentery with mild adjacent thickening, but no abscess or drainable collection. Trace non organized free fluid and stranding in the pelvis. 2. Short segment of small bowel wall thickening and inflammation in the pelvis involving the distal ileum, likely reactive. 3. Dilated distal esophagus, stomach, and small bowel, without discrete transition point, favoring postoperative ileus. 4. Small bilateral pleural effusions and compressive atelectasis. 5. Heterogeneous partially enhancing 14 mm  lymph node in the retroperitoneum at the aortoiliac bifurcation, not significantly changed from prior exam. Suspected additional lymph nodes in the anterior common iliac space, not significantly changed from prior exam. Recommend attention at follow-up. 6. Additional chronic findings as described.     08/15/2021 Imaging   EXAM: CT CHEST WITH CONTRAST  IMPRESSION: 1. No evidence of thoracic metastasis. 2. Bilateral small layering pleural effusions with passive atelectasis   08/26/2021 Initial Diagnosis   Cancer of left colon (HCC)   10/10/2021 - 12/12/2021 Chemotherapy   Patient is on Treatment Plan : COLORECTAL Xelox (Capeox) q21d     05/28/2022 - 05/28/2022 Chemotherapy   Patient is on Treatment Plan : COLORECTAL FOLFIRI + Bevacizumab q14d     05/28/2022 -  Chemotherapy   Patient is on Treatment Plan : COLORECTAL FOLFIRI + Panitumumab  q14d     07/31/2022 Imaging    IMPRESSION: Decreased bilateral pulmonary metastases.   Stable mild abdominal lymphadenopathy.   No new or progressive metastatic disease within the chest, abdomen, or pelvis.   01/18/2023 Imaging    IMPRESSION: 1. Status post Esther Hem pouch sigmoid colon resection with left lower quadrant end colostomy and rectal stump. Unchanged, mild wall thickening of the decompressed distal colon at the ileostomy, as well as the rectal stump. 2. Unchanged small, treated metastatic pulmonary nodules. No new nodules. 3. Unchanged enlarged, coarsely calcified treated lower aortocaval metastatic lymph nodes. No new lymphadenopathy. 4. Coronary artery disease. 5. Cholelithiasis.   04/23/2023 Imaging   CT chest, abdomen, and pelvis with contrast  IMPRESSION: 1. Left lower lobe segmental pulmonary emboli, incidental finding. No evidence for right heart strain. 2. Stable trace bilateral pleural effusions. 3. Stable enlarged lower left paraesophageal lymph node. 4. Stable calcified retroperitoneal lymph nodes. No new  enlarged lymph nodes in the chest, abdomen or pelvis. 5. Stable subcentimeter  soft tissue nodules within the mesenteric fat along the inferior ostomy. 6. Left Bosniak I benign renal cyst measuring 4.6 cm. No follow-up imaging is recommended. JACR 2018 Feb; 264-273, Management of the Incidental Renal Mass on CT, [employer] 2021; 814-848, Bosniak Classification of Cystic Renal Masses, Version 2019.   07/15/2023 Imaging   CT chest abdomen and pelvis with contrast IMPRESSION: 1. Prior partial left hemicolectomy with end colostomy in the left anterior abdominal wall. Unchanged mild wall thickening of the decompressed distal colon at the ileostomy as well as the rectal stump/Hartmann's pouch. 2. Stable enlarged left lower paraesophageal lymph node. 3. Increased size of the treated pulmonary nodule in the lingula now measuring 6 mm previously 4 mm with increased in size favored artifactual given motion degrading examination of the lung bases. Suggest attention on follow-up imaging 4. Unchanged size of the treated pulmonary nodule in the right middle lobe measuring 4 mm. 5. Increased size of prominent bilateral axillary lymph nodes measuring up to 7 mm in short axis, nonspecific. Suggest attention on follow-up imaging 6. No convincing evidence of new or progressive disease in the chest, abdomen or pelvis. 7.  Aortic Atherosclerosis (ICD10-I70.0).     10/06/2023 Imaging   CT chest abdomen pelvis with contrast IMPRESSION: 1. Increased size of the pulmonary nodule in the lingula now measuring 7 mm, concerning for pulmonary metastatic disease recurrence. 2. Additional bilateral pulmonary nodules are stable. 3. Stable prominent bilateral axillary, low paraesophageal and retroperitoneal lymph nodes. 4. Prior partial left hemicolectomy with Hartmann's pouch formation and left anterior abdominal wall colostomy. No suspicious nodularity along the Hartmann's suture line. 5. Patulous esophagus with symmetric  distal esophageal wall thickening, similar prior. 6. Trace right pleural effusion. 7.  Aortic Atherosclerosis (ICD10-I70.0).        Discussed the use of AI scribe software for clinical note transcription with the patient, who gave verbal consent to proceed.  History of Present Illness A 58 year old patient with a history of colon cancer presents for a routine follow-up visit. He reports no problems or discomfort since his last treatment. He has been eating well and his weight has remained stable at 150 pounds. He denies any need for medication refills. The patient's last scan was on March 5th, and he is not due for another scan for a while. The patient's lab results are pending at the time of the visit.     All other systems were reviewed with the patient and are negative.  MEDICAL HISTORY:  Past Medical History:  Diagnosis Date   Cancer (HCC)    Schizophrenia [facility])     SURGICAL HISTORY: Past Surgical History:  Procedure Laterality Date   BRONCHIAL BIOPSY  05/11/2022   Procedure: BRONCHIAL BIOPSIES;  Surgeon: Denson Flake, MD;  Location: [facility] ENDOSCOPY;  Service: Pulmonary;;   BRONCHIAL BRUSHINGS  05/11/2022   Procedure: BRONCHIAL BRUSHINGS;  Surgeon: Denson Flake, MD;  Location: [facility] ENDOSCOPY;  Service: Pulmonary;;   BRONCHIAL NEEDLE ASPIRATION BIOPSY  05/11/2022   Procedure: BRONCHIAL NEEDLE ASPIRATION BIOPSIES;  Surgeon: Denson Flake, MD;  Location: [facility] ENDOSCOPY;  Service: Pulmonary;;   BRONCHIAL WASHINGS  05/11/2022   Procedure: BRONCHIAL WASHINGS;  Surgeon: Denson Flake, MD;  Location: [facility] ENDOSCOPY;  Service: Pulmonary;;   IR IMAGING GUIDED PORT INSERTION  05/25/2022   LAPAROTOMY N/A 08/08/2021   Procedure: EXPLORATORY LAPAROTOMY;  Surgeon: Adalberto Acton, MD;  Location: [facility] OR;  Service: General;  Laterality: N/A;   PARTIAL COLECTOMY N/A 08/08/2021   Procedure: PARTIAL COLECTOMY  WITH COLOSTOMY;  Surgeon: Adalberto Acton, MD;  Location: [facility] OR;  Service: General;   Laterality: N/A;   VIDEO BRONCHOSCOPY WITH RADIAL ENDOBRONCHIAL ULTRASOUND  05/11/2022   Procedure: VIDEO BRONCHOSCOPY WITH RADIAL ENDOBRONCHIAL ULTRASOUND;  Surgeon: Denson Flake, MD;  Location: [facility] ENDOSCOPY;  Service: Pulmonary;;    I have reviewed the social history and family history with the patient and they are unchanged from previous note.  ALLERGIES:  has no known allergies.  MEDICATIONS:  Current Outpatient Medications  Medication Sig Dispense Refill   acetaminophen  (TYLENOL ) 500 MG tablet Take 2 tablets (1,000 mg total) by mouth every 6 (six) hours as needed for mild pain or moderate pain.  0   apixaban  (ELIQUIS ) 5 MG TABS tablet Take 1 tablet (5 mg total) by mouth 2 (two) times daily. 60 tablet 2   aspirin EC 81 MG tablet Take 81 mg by mouth daily as needed (for pain or headaches).      benztropine  (COGENTIN ) 1 MG tablet Take 1 mg at bedtime by mouth.     clindamycin  (CLINDAGEL) 1 % gel Apply topically 2 (two) times daily. To skin rash on face and upper body 30 g 0   ferrous sulfate  325 (65 FE) MG EC tablet Take 1 tablet (325 mg total) by mouth daily. 30 tablet 3   hydrocortisone  cream 1 % Apply 1 Application topically 2 (two) times daily as needed for itching. For rash 30 g 2   loperamide  (IMODIUM ) 2 MG capsule Take 1 capsule (2 mg total) by mouth as needed for diarrhea or loose stools. 30 capsule 0   magnesium  oxide (MAG-OX) 400 (240 Mg) MG tablet Take 1 tablet (400 mg total) by mouth daily. 30 tablet 2   Multiple Vitamin (MULTIVITAMIN WITH MINERALS) TABS tablet Take 1 tablet by mouth daily.     ondansetron  (ZOFRAN ) 8 MG tablet Take 1 tablet (8 mg total) by mouth every 8 (eight) hours as needed for nausea or vomiting. 30 tablet 0   paliperidone  (INVEGA  SUSTENNA) 156 MG/ML SUSP injection Inject 156 mg every 30 (thirty) days into the muscle.     polyethylene glycol (MIRALAX  / GLYCOLAX ) 17 g packet Take 17 g by mouth daily as needed for mild constipation or moderate constipation.   0   potassium chloride  SA (KLOR-CON  M) 20 MEQ tablet Take 1 tablet (20 mEq total) by mouth daily. 30 tablet 2   prochlorperazine  (COMPAZINE ) 10 MG tablet TAKE 1 TABLET(10 MG) BY MOUTH EVERY 6 HOURS AS NEEDED FOR NAUSEA OR VOMITING 30 tablet 0   No current facility-administered medications for this visit.    PHYSICAL EXAMINATION: ECOG PERFORMANCE STATUS: 0 - Asymptomatic  Vitals:   11/18/23 1111  BP: 102/60  Pulse: (!) 102  Resp: 17  Temp: 98.2 F (36.8 C)  SpO2: 99%   Wt Readings from Last 3 Encounters:  11/18/23 150 lb 1.6 oz (68.1 kg)  10/30/23 150 lb (68 kg)  10/28/23 150 lb 3.2 oz (68.1 kg)     GENERAL:alert, no distress and comfortable SKIN: skin color, texture, turgor are normal, no rashes or significant lesions EYES: normal, Conjunctiva are pink and non-injected, sclera clear NECK: supple, thyroid normal size, non-tender, without nodularity LYMPH:  no palpable lymphadenopathy in the cervical, axillary  LUNGS: clear to auscultation and percussion with normal breathing effort HEART: regular rate & rhythm and no murmurs and no lower extremity edema ABDOMEN:abdomen soft, non-tender and normal bowel sounds Musculoskeletal:no cyanosis of digits and no clubbing  NEURO: alert &  oriented x 3 with fluent speech, no focal motor/sensory deficits  Physical Exam   LABORATORY DATA:  I have reviewed the data as listed    Latest Ref Rng & Units 11/18/2023   10:52 AM 10/28/2023   10:53 AM 10/14/2023   11:30 AM  CBC  WBC 4.0 - 10.5 K/uL 5.4  3.5  4.5   Hemoglobin 13.0 - 17.0 g/dL 95.6  21.3  08.6   Hematocrit 39.0 - 52.0 % 40.4  40.5  37.3   Platelets 150 - 400 K/uL 287  298  311         Latest Ref Rng & Units 11/18/2023   10:52 AM 10/28/2023   10:53 AM 10/14/2023   11:30 AM  CMP  Glucose 70 - 99 mg/dL 578  91  89   BUN 6 - 20 mg/dL 8  8  9    Creatinine 0.61 - 1.24 mg/dL 4.69  6.29  5.28   Sodium 135 - 145 mmol/L 140  139  139   Potassium 3.5 - 5.1 mmol/L 3.5  3.8  3.6    Chloride 98 - 111 mmol/L 108  107  107   CO2 22 - 32 mmol/L 26  26  26    Calcium  8.9 - 10.3 mg/dL 9.3  9.2  8.5   Total Protein 6.5 - 8.1 g/dL 6.9  7.0  6.9   Total Bilirubin 0.0 - 1.2 mg/dL 0.8  0.9  0.7   Alkaline Phos 38 - 126 U/L 117  106  116   AST 15 - 41 U/L 25  17  21    ALT 0 - 44 U/L 26  13  21        RADIOGRAPHIC STUDIES: I have personally reviewed the radiological images as listed and agreed with the findings in the report. No results found.    No orders of the defined types were placed in this encounter.  All questions were answered. The patient knows to call the clinic with any problems, questions or concerns. No barriers to learning was detected. The total time spent in the appointment was 25 minutes.     Sonja Valdez-Cordova, MD 11/18/2023

## 2023-11-18 NOTE — Patient Instructions (Signed)
 [facility] - A DEPT OF [facility]  Discharge Instructions: Thank you for choosing [facility] to provide your oncology and hematology care.   If you have a lab appointment with the Cancer Center, please go directly to the Cancer Center and check in at the registration area.   Wear comfortable clothing and clothing appropriate for easy access to any Portacath or PICC line.   We strive to give you quality time with your provider. You may need to reschedule your appointment if you arrive late (15 or more minutes).  Arriving late affects you and other patients whose appointments are after yours.  Also, if you miss three or more appointments without notifying the office, you may be dismissed from the clinic at the provider's discretion.      For prescription refill requests, have your pharmacy contact our office and allow 72 hours for refills to be completed.    Today you received the following chemotherapy and/or immunotherapy agents: Vectibix, Leucovorin, 5FU      To help prevent nausea and vomiting after your treatment, we encourage you to take your nausea medication as directed.  BELOW ARE SYMPTOMS THAT SHOULD BE REPORTED IMMEDIATELY: *FEVER GREATER THAN 100.4 F (38 C) OR HIGHER *CHILLS OR SWEATING *NAUSEA AND VOMITING THAT IS NOT CONTROLLED WITH YOUR NAUSEA MEDICATION *UNUSUAL SHORTNESS OF BREATH *UNUSUAL BRUISING OR BLEEDING *URINARY PROBLEMS (pain or burning when urinating, or frequent urination) *BOWEL PROBLEMS (unusual diarrhea, constipation, pain near the anus) TENDERNESS IN MOUTH AND THROAT WITH OR WITHOUT PRESENCE OF ULCERS (sore throat, sores in mouth, or a toothache) UNUSUAL RASH, SWELLING OR PAIN  UNUSUAL VAGINAL DISCHARGE OR ITCHING   Items with * indicate a potential emergency and should be followed up as soon as possible or go to the Emergency Department if any problems should occur.  Please show the CHEMOTHERAPY ALERT CARD  or IMMUNOTHERAPY ALERT CARD at check-in to the Emergency Department and triage nurse.  Should you have questions after your visit or need to cancel or reschedule your appointment, please contact [facility] - A DEPT OF [facility]  Dept: [phone]  and follow the prompts.  Office hours are 8:00 a.m. to 4:30 p.m. Monday - Friday. Please note that voicemails left after 4:00 p.m. may not be returned until the following business day.  We are closed weekends and major holidays. You have access to a nurse at all times for urgent questions. Please call the main number to the clinic Dept: [phone] and follow the prompts.   For any non-urgent questions, you may also contact your provider using MyChart. We now offer e-Visits for anyone 43 and older to request care online for non-urgent symptoms. For details visit [email or web address].   Also download the MyChart app! Go to the app store, search "MyChart", open the app, select [facility], and log in with your MyChart username and password.  Hypomagnesemia Hypomagnesemia is a condition in which the level of magnesium in the blood is too low. Magnesium is a mineral that is found in many foods. It is used in many different processes in the body. Hypomagnesemia can affect every organ in the body. In severe cases, it can cause life-threatening problems. What are the causes? This condition may be caused by: Not getting enough magnesium in your diet or not having enough healthy foods to eat (malnutrition). Problems with magnesium absorption in the intestines. Dehydration. Excessive use of  alcohol. Vomiting. Severe or long-term (chronic) diarrhea. Some medicines, including medicines that make you urinate more often (diuretics). Certain diseases, such as kidney disease, diabetes, celiac disease, and overactive thyroid. What are the signs or symptoms? Symptoms of this condition include: Loss of appetite, nausea, and  vomiting. Involuntary shaking or trembling of a body part (tremor). Muscle weakness or tingling in the arms and legs. Sudden tightening of muscles (muscle spasms). Confusion. Psychiatric issues, such as: Depression and irritability. Psychosis. A feeling of fluttering of the heart (palpitations). Seizures. These symptoms are more severe if magnesium levels drop suddenly. How is this diagnosed? This condition may be diagnosed based on: Your symptoms and medical history. A physical exam. Blood and urine tests. How is this treated? Treatment depends on the cause and the severity of the condition. It may be treated by: Taking a magnesium supplement. This can be taken in pill form. If the condition is severe, magnesium is usually given through an IV. Making changes to your diet. You may be directed to eat foods that have a lot of magnesium, such as green leafy vegetables, peas, beans, and nuts. Not drinking alcohol. If you are struggling not to drink, ask your health care provider for help. Follow these instructions at home: Eating and drinking     Make sure that your diet includes foods with magnesium. Foods that have a lot of magnesium in them include: Green leafy vegetables, such as spinach and broccoli. Beans and peas. Nuts and seeds, such as almonds and sunflower seeds. Whole grains, such as whole grain bread and fortified cereals. Drink fluids that contain salts and minerals (electrolytes), such as sports drinks, when you are active. Do not drink alcohol. General instructions Take over-the-counter and prescription medicines only as told by your health care provider. Take magnesium supplements as directed if your health care provider tells you to take them. Have your magnesium levels monitored as told by your health care provider. Keep all follow-up visits. This is important. Contact a health care provider if: You get worse instead of better. Your symptoms return. Get help  right away if: You develop severe muscle weakness. You have trouble breathing. You feel that your heart is racing. These symptoms may represent a serious problem that is an emergency. Do not wait to see if the symptoms will go away. Get medical help right away. Call your local emergency services (911 in the [place] Do not drive yourself to the hospital. Summary Hypomagnesemia is a condition in which the level of magnesium in the blood is too low. Hypomagnesemia can affect every organ in the body. Treatment may include eating more foods that contain magnesium, taking magnesium supplements, and not drinking alcohol. Have your magnesium levels monitored as told by your health care provider. This information is not intended to replace advice given to you by your health care provider. Make sure you discuss any questions you have with your health care provider. Document Revised: 12/17/2020 Document Reviewed: 12/17/2020 Elsevier Patient Education  2024 [employer].

## 2023-11-19 ENCOUNTER — Encounter: Payer: Self-pay | Admitting: Hematology

## 2023-11-19 ENCOUNTER — Encounter: Payer: Self-pay | Admitting: Nurse Practitioner

## 2023-11-20 ENCOUNTER — Inpatient Hospital Stay: Payer: MEDICAID

## 2023-11-20 VITALS — BP 97/73 | HR 100 | Temp 97.6°F | Resp 17

## 2023-11-20 DIAGNOSIS — Z5111 Encounter for antineoplastic chemotherapy: Secondary | ICD-10-CM | POA: Diagnosis not present

## 2023-11-20 DIAGNOSIS — C186 Malignant neoplasm of descending colon: Secondary | ICD-10-CM

## 2023-11-20 MED ORDER — SODIUM CHLORIDE 0.9% FLUSH
10.0000 mL | INTRAVENOUS | Status: DC | PRN
  Administered 2023-11-20: 10 mL

## 2023-11-20 MED ORDER — HEPARIN SOD (PORK) LOCK FLUSH 100 UNIT/ML IV SOLN
500.0000 [IU] | Freq: Once | INTRAVENOUS | Status: AC | PRN
Start: 2023-11-20 — End: 2023-11-20
  Administered 2023-11-20: 500 [IU]

## 2023-11-20 NOTE — Progress Notes (Signed)
 Per Sammi Crick, RN, magnesium  infusion (scheduled for today) to be rescheduled for this coming week.  Pt verbalized understanding.

## 2023-12-01 ENCOUNTER — Other Ambulatory Visit: Payer: Self-pay | Admitting: Pharmacist

## 2023-12-02 ENCOUNTER — Inpatient Hospital Stay: Payer: MEDICAID

## 2023-12-02 ENCOUNTER — Inpatient Hospital Stay (HOSPITAL_BASED_OUTPATIENT_CLINIC_OR_DEPARTMENT_OTHER): Payer: MEDICAID | Admitting: Hematology

## 2023-12-02 ENCOUNTER — Inpatient Hospital Stay: Payer: MEDICAID | Attending: Hematology

## 2023-12-02 VITALS — BP 106/72 | HR 87 | Temp 97.9°F | Resp 17 | Ht 69.0 in | Wt 149.1 lb

## 2023-12-02 DIAGNOSIS — Z5112 Encounter for antineoplastic immunotherapy: Secondary | ICD-10-CM | POA: Diagnosis present

## 2023-12-02 DIAGNOSIS — C7802 Secondary malignant neoplasm of left lung: Secondary | ICD-10-CM | POA: Diagnosis not present

## 2023-12-02 DIAGNOSIS — C186 Malignant neoplasm of descending colon: Secondary | ICD-10-CM | POA: Insufficient documentation

## 2023-12-02 DIAGNOSIS — C7801 Secondary malignant neoplasm of right lung: Secondary | ICD-10-CM | POA: Insufficient documentation

## 2023-12-02 DIAGNOSIS — Z95828 Presence of other vascular implants and grafts: Secondary | ICD-10-CM

## 2023-12-02 DIAGNOSIS — C772 Secondary and unspecified malignant neoplasm of intra-abdominal lymph nodes: Secondary | ICD-10-CM | POA: Insufficient documentation

## 2023-12-02 DIAGNOSIS — Z79899 Other long term (current) drug therapy: Secondary | ICD-10-CM | POA: Diagnosis not present

## 2023-12-02 DIAGNOSIS — C189 Malignant neoplasm of colon, unspecified: Secondary | ICD-10-CM

## 2023-12-02 LAB — CBC WITH DIFFERENTIAL (CANCER CENTER ONLY)
Abs Immature Granulocytes: 0.01 10*3/uL (ref 0.00–0.07)
Basophils Absolute: 0 10*3/uL (ref 0.0–0.1)
Basophils Relative: 0 %
Eosinophils Absolute: 0.1 10*3/uL (ref 0.0–0.5)
Eosinophils Relative: 2 %
HCT: 39.1 % (ref 39.0–52.0)
Hemoglobin: 13.4 g/dL (ref 13.0–17.0)
Immature Granulocytes: 0 %
Lymphocytes Relative: 19 %
Lymphs Abs: 0.8 10*3/uL (ref 0.7–4.0)
MCH: 29.3 pg (ref 26.0–34.0)
MCHC: 34.3 g/dL (ref 30.0–36.0)
MCV: 85.4 fL (ref 80.0–100.0)
Monocytes Absolute: 0.3 10*3/uL (ref 0.1–1.0)
Monocytes Relative: 7 %
Neutro Abs: 3.2 10*3/uL (ref 1.7–7.7)
Neutrophils Relative %: 72 %
Platelet Count: 306 10*3/uL (ref 150–400)
RBC: 4.58 MIL/uL (ref 4.22–5.81)
RDW: 18.8 % — ABNORMAL HIGH (ref 11.5–15.5)
WBC Count: 4.5 10*3/uL (ref 4.0–10.5)
nRBC: 0 % (ref 0.0–0.2)

## 2023-12-02 LAB — CMP (CANCER CENTER ONLY)
ALT: 14 U/L (ref 0–44)
AST: 16 U/L (ref 15–41)
Albumin: 4.1 g/dL (ref 3.5–5.0)
Alkaline Phosphatase: 102 U/L (ref 38–126)
Anion gap: 6 (ref 5–15)
BUN: 10 mg/dL (ref 6–20)
CO2: 26 mmol/L (ref 22–32)
Calcium: 9 mg/dL (ref 8.9–10.3)
Chloride: 106 mmol/L (ref 98–111)
Creatinine: 0.69 mg/dL (ref 0.61–1.24)
GFR, Estimated: >60 mL/min (ref >60)
Glucose, Bld: 101 mg/dL — ABNORMAL HIGH (ref 70–99)
Potassium: 3.5 mmol/L (ref 3.5–5.1)
Sodium: 138 mmol/L (ref 135–145)
Total Bilirubin: 0.8 mg/dL (ref 0.0–1.2)
Total Protein: 6.9 g/dL (ref 6.5–8.1)

## 2023-12-02 LAB — MAGNESIUM: Magnesium: 1.3 mg/dL — ABNORMAL LOW (ref 1.7–2.4)

## 2023-12-02 MED ORDER — SODIUM CHLORIDE 0.9 % IV SOLN
6.0000 mg/kg | Freq: Once | INTRAVENOUS | Status: AC
  Administered 2023-12-02: 400 mg via INTRAVENOUS
  Filled 2023-12-02: qty 20

## 2023-12-02 MED ORDER — SODIUM CHLORIDE 0.9% FLUSH
10.0000 mL | INTRAVENOUS | Status: DC | PRN
Start: 2023-12-02 — End: 2023-12-02
  Administered 2023-12-02: 10 mL

## 2023-12-02 MED ORDER — SODIUM CHLORIDE 0.9 % IV SOLN
400.0000 mg/m2 | Freq: Once | INTRAVENOUS | Status: AC
  Administered 2023-12-02: 728 mg via INTRAVENOUS
  Filled 2023-12-02: qty 25

## 2023-12-02 MED ORDER — SODIUM CHLORIDE 0.9 % IV SOLN
2400.0000 mg/m2 | INTRAVENOUS | Status: DC
  Administered 2023-12-02: 4350 mg via INTRAVENOUS
  Filled 2023-12-02: qty 87

## 2023-12-02 MED ORDER — MAGNESIUM SULFATE 4 GM/100ML IV SOLN
4.0000 g | Freq: Once | INTRAVENOUS | Status: AC
  Administered 2023-12-02: 4 g via INTRAVENOUS
  Filled 2023-12-02: qty 100

## 2023-12-02 MED ORDER — SODIUM CHLORIDE 0.9% FLUSH
10.0000 mL | Freq: Once | INTRAVENOUS | Status: AC
Start: 2023-12-02 — End: 2023-12-02
  Administered 2023-12-02: 10 mL

## 2023-12-02 MED ORDER — SODIUM CHLORIDE 0.9 % IV SOLN: Freq: Once | INTRAVENOUS | Status: AC

## 2023-12-02 NOTE — Patient Instructions (Addendum)
 [facility] - A DEPT OF [facility]  Discharge Instructions: Thank you for choosing [facility] to provide your oncology and hematology care.   If you have a lab appointment with the Cancer Center, please go directly to the Cancer Center and check in at the registration area.   Wear comfortable clothing and clothing appropriate for easy access to any Portacath or PICC line.   We strive to give you quality time with your provider. You may need to reschedule your appointment if you arrive late (15 or more minutes).  Arriving late affects you and other patients whose appointments are after yours.  Also, if you miss three or more appointments without notifying the office, you may be dismissed from the clinic at the provider's discretion.      For prescription refill requests, have your pharmacy contact our office and allow 72 hours for refills to be completed.    Today you received the following chemotherapy and/or immunotherapy agents: Vectibix /Leucovorin /Fluorouracil        To help prevent nausea and vomiting after your treatment, we encourage you to take your nausea medication as directed.  BELOW ARE SYMPTOMS THAT SHOULD BE REPORTED IMMEDIATELY: *FEVER GREATER THAN 100.4 F (38 C) OR HIGHER *CHILLS OR SWEATING *NAUSEA AND VOMITING THAT IS NOT CONTROLLED WITH YOUR NAUSEA MEDICATION *UNUSUAL SHORTNESS OF BREATH *UNUSUAL BRUISING OR BLEEDING *URINARY PROBLEMS (pain or burning when urinating, or frequent urination) *BOWEL PROBLEMS (unusual diarrhea, constipation, pain near the anus) TENDERNESS IN MOUTH AND THROAT WITH OR WITHOUT PRESENCE OF ULCERS (sore throat, sores in mouth, or a toothache) UNUSUAL RASH, SWELLING OR PAIN  UNUSUAL VAGINAL DISCHARGE OR ITCHING   Items with * indicate a potential emergency and should be followed up as soon as possible or go to the Emergency Department if any problems should occur.  Please show the CHEMOTHERAPY  ALERT CARD or IMMUNOTHERAPY ALERT CARD at check-in to the Emergency Department and triage nurse.  Should you have questions after your visit or need to cancel or reschedule your appointment, please contact [facility] - A DEPT OF [facility]  Dept: [phone]  and follow the prompts.  Office hours are 8:00 a.m. to 4:30 p.m. Monday - Friday. Please note that voicemails left after 4:00 p.m. may not be returned until the following business day.  We are closed weekends and major holidays. You have access to a nurse at all times for urgent questions. Please call the main number to the clinic Dept: [phone] and follow the prompts.   For any non-urgent questions, you may also contact your provider using MyChart. We now offer e-Visits for anyone 1 and older to request care online for non-urgent symptoms. For details visit [email or web address].   Also download the MyChart app! Go to the app store, search "MyChart", open the app, select [place], and log in with your MyChart username and password.  The chemotherapy medication bag should finish at 46 hours, 96 hours, or 7 days. For example, if your pump is scheduled for 46 hours and it was put on at 4:00 p.m., it should finish at 2:00 p.m. the day it is scheduled to come off regardless of your appointment time.     Estimated time to finish at: 2:00 PM on 12/04/23   If the display on your pump reads "Low Volume" and it is beeping, take the batteries out of the pump and come to the cancer  center for it to be taken off.   If the pump alarms go off prior to the pump reading "Low Volume" then call [phone] and someone can assist you.  If the plunger comes out and the chemotherapy medication is leaking out, please use your home chemo spill kit to clean up the spill. Do NOT use paper towels or other household products.  If you have problems or questions regarding your pump, please call either [phone] (24  hours a day) or the cancer center Monday-Friday 8:00 a.m.- 4:30 p.m. at the clinic number and we will assist you. If you are unable to get assistance, then go to the nearest Emergency Department and ask the staff to contact the IV team for assistance.

## 2023-12-02 NOTE — Assessment & Plan Note (Signed)
 stage IIIB p(T3, N1aM0), MSS, KRAS/NRAS/BRAF wild type, lung and node metastasis in 04/2022  -Initially diagnosed in 08/2021 -he completed 4 cycles adjuvant CAPOX 10/10/21 - 12/25/21 (though he somehow was still taking Xeloda through ~02/09/22 due to misunderstandings).  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 -we have changed his treatment to maintenance therapy with 5-fu and vectibix in late March 2024 -He is tolerating treatment very well, will continue. -due to metastasis in both lungs and retroperitoneal lymph nodes, he is not a candidate for surgery or local therapy  -restaging CT 10/06/2023 showed stable disease overall except increased size of the pulmonary nodule in the lingula now measuring 7 mm Will continue current therapy.

## 2023-12-03 ENCOUNTER — Encounter: Payer: Self-pay | Admitting: Hematology

## 2023-12-03 ENCOUNTER — Encounter: Payer: Self-pay | Admitting: Nurse Practitioner

## 2023-12-03 NOTE — Progress Notes (Addendum)
 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  -He had a metastatic recurrence in the lungs and retroperitoneal adenopathy in September 2023 -he started FOLFIRI on 05/08/2022, Vectibix  added from cycle 2, tolerating well overall -restaging CT scan image from August 02, 2022 and 10/28/2022 both showed good partial response in pulmonary metastasis, no other new lesions. -we have changed his treatment to maintenance therapy with 5-fu and vectibix  in late March 2024 -He is tolerating treatment very well, will continue. -due to metastasis in both lungs and retroperitoneal lymph nodes, he is not a candidate for surgery or local therapy  -restaging CT 10/06/2023 showed stable disease overall except increased size of the pulmonary nodule in the lingula now measuring 7 mm Will continue current therapy.  Assessment & Plan Metastatic colon cancer Metastatic colon cancer with pulmonary nodules. The March CT scan showed a slight increase in the size of a pulmonary nodule, though it remains small. He is undergoing biweekly treatment, which he tolerates well without side effects. - Continue biweekly treatment regimen - Order CT scan in late May to monitor pulmonary nodule - Coordinate with radiology for CT scan scheduling  post-treatment  Hypomagnesemia Persistent hypomagnesemia likely secondary to Vectibix  treatment. Current daily magnesium  supplementation is insufficient, necessitating increased frequency. - Increase magnesium  supplementation to three times daily with meals - Ensure magnesium  prescription is filled and available at home  Plan - Lab reviewed, adequate for treatment, will proceed chemo today and continue every 2 weeks - Follow-up in 2 weeks - Repeat CT scan in 3 weeks   SUMMARY OF ONCOLOGIC HISTORY: Oncology History Overview Note   Cancer Staging  Cancer of left colon [facility]) Staging form: Colon and Rectum, AJCC 8th Edition - Pathologic stage from 08/08/2021: Stage IIIB (pT3, pN1a, cM0) - Signed by Sonja McIntosh, MD on 08/26/2021    Cancer of left colon (HCC)  04/01/2020 Imaging   IMPRESSION: 1. Gallbladder decompressed bowel also partially calcified gallstones. No pericholecystic inflammation though if there is persisting clinical concern for cholecystitis right upper quadrant ultrasound could be obtained. 2. Circumferential thickening of the distal thoracic esophagus. Could reflect features of esophagitis. Correlate with clinical symptoms and consider endoscopy as clinically warranted. 3. Additional segmental thickening of the mid to distal sigmoid with focal narrowing. No acute surrounding inflammation or resulting obstruction. Findings are nonspecific, and could reflect sequela of prior inflammation/infection. However, recommend correlation with colonoscopy if not recently performed. 4. Mild circumferential bladder wall thickening and indentation of the bladder base by an enlarged prostate. Possibly sequela of chronic outlet obstruction though could correlate with urinalysis to exclude cystitis. 5. Aortic Atherosclerosis (ICD10-I70.0).   08/06/2021 Imaging   IMPRESSION: Sigmoid colonic perforation with small free intraperitoneal gas and infiltration of the mesenteric and omental  fat in keeping with changes of peritonitis.   Long segment inflammatory stranding of the sigmoid colon in keeping with  a severe infectious or inflammatory colitis. This terminates an area of irregular mural thickening, infiltrative soft tissue within the colonic mesentery, and focal dystrophic calcification. This may represent a chronic inflammatory process, however, a perforated malignancy could appear similarly. There are 2 separate points of perforation which again raise the question of an underlying malignancy.   7.1 cm gas and fluid containing pericolonic abscess within the sigmoid mesentery.   Marked inflammatory change of the terminal ileum adjacent to the pericolonic abscess with resultant small bowel obstruction. Fluid within the distal esophagus likely relates to gastroesophageal reflux the setting of vomiting.   Aortic Atherosclerosis (ICD10-I70.0).   08/08/2021 Cancer Staging   Staging form: Colon and Rectum, AJCC 8th Edition - Pathologic stage from 08/08/2021: Stage IIIB (pT3, pN1a, cM0) - Signed by Sonja Danbury, MD on 08/26/2021 Stage prefix: Initial diagnosis Total positive nodes: 1 Histologic grading system: 4 grade system Histologic grade (G): G2 Residual tumor (R): R0 - None   08/08/2021 Definitive Surgery   FINAL MICROSCOPIC DIAGNOSIS:   A. COLON, SIGMOID, PARTIAL COLECTOMY:  - Invasive moderately differentiated adenocarcinoma.  - Metastatic carcinoma involving one of twelve lymph nodes (1/12).  - See oncology table below.   ADDENDUM:  Mismatch Repair Protein (IHC)  SUMMARY INTERPRETATION: NORMAL    08/14/2021 Imaging   EXAM: CT ABDOMEN AND PELVIS WITH CONTRAST  IMPRESSION: 1. Post recent sigmoid colectomy with left lower quadrant colostomy. Two small residual foci of air within the pelvic mesentery with mild adjacent thickening, but no abscess or drainable collection. Trace non organized free fluid and stranding in the pelvis. 2. Short segment of small bowel wall  thickening and inflammation in the pelvis involving the distal ileum, likely reactive. 3. Dilated distal esophagus, stomach, and small bowel, without discrete transition point, favoring postoperative ileus. 4. Small bilateral pleural effusions and compressive atelectasis. 5. Heterogeneous partially enhancing 14 mm lymph node in the retroperitoneum at the aortoiliac bifurcation, not significantly changed from prior exam. Suspected additional lymph nodes in the anterior common iliac space, not significantly changed from prior exam. Recommend attention at follow-up. 6. Additional chronic findings as described.     08/15/2021 Imaging   EXAM: CT CHEST WITH CONTRAST  IMPRESSION: 1. No evidence of thoracic metastasis. 2. Bilateral small layering pleural effusions with passive atelectasis   08/26/2021 Initial Diagnosis   Cancer of left colon (HCC)   10/10/2021 - 12/12/2021 Chemotherapy   Patient is on Treatment Plan : COLORECTAL Xelox (Capeox) q21d     05/28/2022 - 05/28/2022 Chemotherapy   Patient is on Treatment Plan : COLORECTAL FOLFIRI + Bevacizumab q14d     05/28/2022 -  Chemotherapy   Patient is on Treatment Plan : COLORECTAL FOLFIRI + Panitumumab  q14d     07/31/2022 Imaging    IMPRESSION: Decreased bilateral pulmonary metastases.   Stable mild abdominal lymphadenopathy.   No new or progressive metastatic disease within the chest, abdomen, or pelvis.   01/18/2023 Imaging    IMPRESSION: 1. Status post Esther Hem pouch sigmoid colon resection with left lower quadrant end colostomy and rectal stump. Unchanged, mild wall thickening of the decompressed distal colon at the ileostomy, as well as the rectal stump. 2. Unchanged small, treated metastatic pulmonary nodules. No new nodules. 3. Unchanged enlarged, coarsely calcified treated lower aortocaval metastatic lymph nodes. No new lymphadenopathy. 4. Coronary artery disease. 5. Cholelithiasis.   04/23/2023 Imaging   CT  chest, abdomen, and pelvis with contrast  IMPRESSION: 1. Left lower lobe segmental pulmonary emboli, incidental finding. No evidence for right  heart strain. 2. Stable trace bilateral pleural effusions. 3. Stable enlarged lower left paraesophageal lymph node. 4. Stable calcified retroperitoneal lymph nodes. No new enlarged lymph nodes in the chest, abdomen or pelvis. 5. Stable subcentimeter soft tissue nodules within the mesenteric fat along the inferior ostomy. 6. Left Bosniak I benign renal cyst measuring 4.6 cm. No follow-up imaging is recommended. JACR 2018 Feb; 264-273, Management of the Incidental Renal Mass on CT, [employer] 2021; 814-848, Bosniak Classification of Cystic Renal Masses, Version 2019.   07/15/2023 Imaging   CT chest abdomen and pelvis with contrast IMPRESSION: 1. Prior partial left hemicolectomy with end colostomy in the left anterior abdominal wall. Unchanged mild wall thickening of the decompressed distal colon at the ileostomy as well as the rectal stump/Hartmann's pouch. 2. Stable enlarged left lower paraesophageal lymph node. 3. Increased size of the treated pulmonary nodule in the lingula now measuring 6 mm previously 4 mm with increased in size favored artifactual given motion degrading examination of the lung bases. Suggest attention on follow-up imaging 4. Unchanged size of the treated pulmonary nodule in the right middle lobe measuring 4 mm. 5. Increased size of prominent bilateral axillary lymph nodes measuring up to 7 mm in short axis, nonspecific. Suggest attention on follow-up imaging 6. No convincing evidence of new or progressive disease in the chest, abdomen or pelvis. 7.  Aortic Atherosclerosis (ICD10-I70.0).     10/06/2023 Imaging   CT chest abdomen pelvis with contrast IMPRESSION: 1. Increased size of the pulmonary nodule in the lingula now measuring 7 mm, concerning for pulmonary metastatic disease recurrence. 2. Additional bilateral pulmonary  nodules are stable. 3. Stable prominent bilateral axillary, low paraesophageal and retroperitoneal lymph nodes. 4. Prior partial left hemicolectomy with Hartmann's pouch formation and left anterior abdominal wall colostomy. No suspicious nodularity along the Hartmann's suture line. 5. Patulous esophagus with symmetric distal esophageal wall thickening, similar prior. 6. Trace right pleural effusion. 7.  Aortic Atherosclerosis (ICD10-I70.0).        Discussed the use of AI scribe software for clinical note transcription with the patient, who gave verbal consent to proceed.  History of Present Illness Matthew [name] is a 58 year old male with metastatic colon cancer who presents for follow-up.  He experiences no new issues or discomfort since his last visit. His chemotherapy regimen includes Vectibix , which is associated with hypomagnesemia, and he continues to take magnesium  supplements daily, though levels remain low. His last CT scan in March showed a slightly larger pulmonary nodule, with a follow-up scan scheduled for late May.     All other systems were reviewed with the patient and are negative.  MEDICAL HISTORY:  Past Medical History:  Diagnosis Date   Cancer (HCC)    Schizophrenia [facility])     SURGICAL HISTORY: Past Surgical History:  Procedure Laterality Date   BRONCHIAL BIOPSY  05/11/2022   Procedure: BRONCHIAL BIOPSIES;  Surgeon: Denson Flake, MD;  Location: [facility] ENDOSCOPY;  Service: Pulmonary;;   BRONCHIAL BRUSHINGS  05/11/2022   Procedure: BRONCHIAL BRUSHINGS;  Surgeon: Denson Flake, MD;  Location: [facility] ENDOSCOPY;  Service: Pulmonary;;   BRONCHIAL NEEDLE ASPIRATION BIOPSY  05/11/2022   Procedure: BRONCHIAL NEEDLE ASPIRATION BIOPSIES;  Surgeon: Denson Flake, MD;  Location: [facility] ENDOSCOPY;  Service: Pulmonary;;   BRONCHIAL WASHINGS  05/11/2022   Procedure: BRONCHIAL WASHINGS;  Surgeon: Denson Flake, MD;  Location: [facility] ENDOSCOPY;  Service: Pulmonary;;   IR IMAGING GUIDED  PORT INSERTION  05/25/2022   LAPAROTOMY N/A 08/08/2021  Procedure: EXPLORATORY LAPAROTOMY;  Surgeon: Adalberto Acton, MD;  Location: [facility] OR;  Service: General;  Laterality: N/A;   PARTIAL COLECTOMY N/A 08/08/2021   Procedure: PARTIAL COLECTOMY WITH COLOSTOMY;  Surgeon: Adalberto Acton, MD;  Location: [facility] OR;  Service: General;  Laterality: N/A;   VIDEO BRONCHOSCOPY WITH RADIAL ENDOBRONCHIAL ULTRASOUND  05/11/2022   Procedure: VIDEO BRONCHOSCOPY WITH RADIAL ENDOBRONCHIAL ULTRASOUND;  Surgeon: Denson Flake, MD;  Location: [facility] ENDOSCOPY;  Service: Pulmonary;;    I have reviewed the social history and family history with the patient and they are unchanged from previous note.  ALLERGIES:  has no known allergies.  MEDICATIONS:  Current Outpatient Medications  Medication Sig Dispense Refill   acetaminophen  (TYLENOL ) 500 MG tablet Take 2 tablets (1,000 mg total) by mouth every 6 (six) hours as needed for mild pain or moderate pain.  0   apixaban  (ELIQUIS ) 5 MG TABS tablet Take 1 tablet (5 mg total) by mouth 2 (two) times daily. 60 tablet 2   aspirin EC 81 MG tablet Take 81 mg by mouth daily as needed (for pain or headaches).      benztropine  (COGENTIN ) 1 MG tablet Take 1 mg at bedtime by mouth.     clindamycin  (CLINDAGEL) 1 % gel Apply topically 2 (two) times daily. To skin rash on face and upper body 30 g 0   ferrous sulfate  325 (65 FE) MG EC tablet Take 1 tablet (325 mg total) by mouth daily. 30 tablet 3   hydrocortisone  cream 1 % Apply 1 Application topically 2 (two) times daily as needed for itching. For rash 30 g 2   loperamide  (IMODIUM ) 2 MG capsule Take 1 capsule (2 mg total) by mouth as needed for diarrhea or loose stools. 30 capsule 0   magnesium  oxide (MAG-OX) 400 (240 Mg) MG tablet Take 1 tablet (400 mg total) by mouth daily. 30 tablet 2   Multiple Vitamin (MULTIVITAMIN WITH MINERALS) TABS tablet Take 1 tablet by mouth daily.     ondansetron  (ZOFRAN ) 8 MG tablet Take 1 tablet (8 mg  total) by mouth every 8 (eight) hours as needed for nausea or vomiting. 30 tablet 0   paliperidone  (INVEGA  SUSTENNA) 156 MG/ML SUSP injection Inject 156 mg every 30 (thirty) days into the muscle.     polyethylene glycol (MIRALAX  / GLYCOLAX ) 17 g packet Take 17 g by mouth daily as needed for mild constipation or moderate constipation.  0   potassium chloride  SA (KLOR-CON  M) 20 MEQ tablet Take 1 tablet (20 mEq total) by mouth daily. 30 tablet 2   prochlorperazine  (COMPAZINE ) 10 MG tablet TAKE 1 TABLET(10 MG) BY MOUTH EVERY 6 HOURS AS NEEDED FOR NAUSEA OR VOMITING 30 tablet 0   No current facility-administered medications for this visit.    PHYSICAL EXAMINATION: ECOG PERFORMANCE STATUS: 1 - Symptomatic but completely ambulatory  Vitals:   12/02/23 1137  BP: 106/72  Pulse: 87  Resp: 17  Temp: 97.9 F (36.6 C)  SpO2: 100%   Wt Readings from Last 3 Encounters:  12/02/23 149 lb 1.6 oz (67.6 kg)  11/18/23 150 lb 1.6 oz (68.1 kg)  10/30/23 150 lb (68 kg)     GENERAL:alert, no distress and comfortable SKIN: skin color, texture, turgor are normal, no rashes or significant lesions EYES: normal, Conjunctiva are pink and non-injected, sclera clear NECK: supple, thyroid normal size, non-tender, without nodularity LYMPH:  no palpable lymphadenopathy in the cervical, axillary  LUNGS: clear to auscultation and percussion with normal breathing  effort HEART: regular rate & rhythm and no murmurs and no lower extremity edema ABDOMEN:abdomen soft, non-tender and normal bowel sounds Musculoskeletal:no cyanosis of digits and no clubbing  NEURO: alert & oriented x 3 with fluent speech, no focal motor/sensory deficits  Physical Exam    LABORATORY DATA:  I have reviewed the data as listed    Latest Ref Rng & Units 12/02/2023   11:04 AM 11/18/2023   10:52 AM 10/28/2023   10:53 AM  CBC  WBC 4.0 - 10.5 K/uL 4.5  5.4  3.5   Hemoglobin 13.0 - 17.0 g/dL 40.9  81.1  91.4   Hematocrit 39.0 - 52.0 % 39.1   40.4  40.5   Platelets 150 - 400 K/uL 306  287  298         Latest Ref Rng & Units 12/02/2023   11:04 AM 11/18/2023   10:52 AM 10/28/2023   10:53 AM  CMP  Glucose 70 - 99 mg/dL 782  956  91   BUN 6 - 20 mg/dL 10  8  8    Creatinine 0.61 - 1.24 mg/dL 2.13  0.86  5.78   Sodium 135 - 145 mmol/L 138  140  139   Potassium 3.5 - 5.1 mmol/L 3.5  3.5  3.8   Chloride 98 - 111 mmol/L 106  108  107   CO2 22 - 32 mmol/L 26  26  26    Calcium  8.9 - 10.3 mg/dL 9.0  9.3  9.2   Total Protein 6.5 - 8.1 g/dL 6.9  6.9  7.0   Total Bilirubin 0.0 - 1.2 mg/dL 0.8  0.8  0.9   Alkaline Phos 38 - 126 U/L 102  117  106   AST 15 - 41 U/L 16  25  17    ALT 0 - 44 U/L 14  26  13        RADIOGRAPHIC STUDIES: I have personally reviewed the radiological images as listed and agreed with the findings in the report. No results found.    Orders Placed This Encounter  Procedures   CT CHEST ABDOMEN PELVIS W CONTRAST    Standing Status:   Future    Expected Date:   12/23/2023    Expiration Date:   12/01/2024    If indicated for the ordered procedure, I authorize the administration of contrast media per Radiology protocol:   Yes    Does the patient have a contrast media/X-ray dye allergy?:   No    Preferred imaging location?:   [facility]    If indicated for the ordered procedure, I authorize the administration of oral contrast media per Radiology protocol:   Yes   CBC with Differential (Cancer Center Only)    Standing Status:   Future    Expected Date:   01/13/2024    Expiration Date:   01/12/2025   CMP (Cancer Center only)    Standing Status:   Future    Expected Date:   01/13/2024    Expiration Date:   01/12/2025   Magnesium     Standing Status:   Future    Expected Date:   01/13/2024    Expiration Date:   01/12/2025   All questions were answered. The patient knows to call the clinic with any problems, questions or concerns. No barriers to learning was detected. The total time spent in the appointment  was 25 minutes.     Sonja Sula, MD 12/02/2023

## 2023-12-04 ENCOUNTER — Inpatient Hospital Stay: Payer: MEDICAID

## 2023-12-04 ENCOUNTER — Other Ambulatory Visit: Payer: Self-pay

## 2023-12-04 VITALS — BP 108/73 | HR 97 | Temp 97.6°F | Resp 16

## 2023-12-04 DIAGNOSIS — C186 Malignant neoplasm of descending colon: Secondary | ICD-10-CM

## 2023-12-04 MED ORDER — HEPARIN SOD (PORK) LOCK FLUSH 100 UNIT/ML IV SOLN
500.0000 [IU] | Freq: Once | INTRAVENOUS | Status: DC | PRN
Start: 2023-12-04 — End: 2023-12-04

## 2023-12-04 MED ORDER — SODIUM CHLORIDE 0.9% FLUSH: 3.0000 mL | INTRAVENOUS | Status: DC | PRN

## 2023-12-04 NOTE — Progress Notes (Signed)
 Pt. Declined Magnesium  infusion. Pt notified that Mag level was low. Pt stated he would see Dr.Feng this week. Sammi Crick, RN aware.

## 2023-12-16 ENCOUNTER — Inpatient Hospital Stay: Payer: MEDICAID

## 2023-12-16 ENCOUNTER — Encounter: Payer: Self-pay | Admitting: Nurse Practitioner

## 2023-12-16 ENCOUNTER — Inpatient Hospital Stay (HOSPITAL_BASED_OUTPATIENT_CLINIC_OR_DEPARTMENT_OTHER): Payer: MEDICAID | Admitting: Nurse Practitioner

## 2023-12-16 ENCOUNTER — Inpatient Hospital Stay: Payer: MEDICAID | Admitting: Nurse Practitioner

## 2023-12-16 VITALS — BP 120/72 | HR 89 | Temp 97.8°F | Resp 17 | Wt 149.0 lb

## 2023-12-16 DIAGNOSIS — C186 Malignant neoplasm of descending colon: Secondary | ICD-10-CM

## 2023-12-16 DIAGNOSIS — Z5112 Encounter for antineoplastic immunotherapy: Secondary | ICD-10-CM | POA: Diagnosis not present

## 2023-12-16 DIAGNOSIS — Z95828 Presence of other vascular implants and grafts: Secondary | ICD-10-CM

## 2023-12-16 DIAGNOSIS — C189 Malignant neoplasm of colon, unspecified: Secondary | ICD-10-CM

## 2023-12-16 LAB — CMP (CANCER CENTER ONLY)
ALT: 23 U/L (ref 0–44)
AST: 26 U/L (ref 15–41)
Albumin: 4.3 g/dL (ref 3.5–5.0)
Alkaline Phosphatase: 101 U/L (ref 38–126)
Anion gap: 6 (ref 5–15)
BUN: 11 mg/dL (ref 6–20)
CO2: 25 mmol/L (ref 22–32)
Calcium: 9.2 mg/dL (ref 8.9–10.3)
Chloride: 107 mmol/L (ref 98–111)
Creatinine: 0.69 mg/dL (ref 0.61–1.24)
GFR, Estimated: >60 mL/min (ref >60)
Glucose, Bld: 104 mg/dL — ABNORMAL HIGH (ref 70–99)
Potassium: 3.7 mmol/L (ref 3.5–5.1)
Sodium: 138 mmol/L (ref 135–145)
Total Bilirubin: 0.9 mg/dL (ref 0.0–1.2)
Total Protein: 7.1 g/dL (ref 6.5–8.1)

## 2023-12-16 LAB — HEMOGLOBIN A1C
Hgb A1c MFr Bld: 4.1 % — ABNORMAL LOW (ref 4.8–5.6)
Mean Plasma Glucose: 70.97 mg/dL

## 2023-12-16 LAB — CBC WITH DIFFERENTIAL (CANCER CENTER ONLY)
Abs Immature Granulocytes: 0.01 10*3/uL (ref 0.00–0.07)
Basophils Absolute: 0 10*3/uL (ref 0.0–0.1)
Basophils Relative: 1 %
Eosinophils Absolute: 0.1 10*3/uL (ref 0.0–0.5)
Eosinophils Relative: 1 %
HCT: 39.7 % (ref 39.0–52.0)
Hemoglobin: 13.9 g/dL (ref 13.0–17.0)
Immature Granulocytes: 0 %
Lymphocytes Relative: 21 %
Lymphs Abs: 1 10*3/uL (ref 0.7–4.0)
MCH: 30.4 pg (ref 26.0–34.0)
MCHC: 35 g/dL (ref 30.0–36.0)
MCV: 86.9 fL (ref 80.0–100.0)
Monocytes Absolute: 0.4 10*3/uL (ref 0.1–1.0)
Monocytes Relative: 8 %
Neutro Abs: 3.3 10*3/uL (ref 1.7–7.7)
Neutrophils Relative %: 69 %
Platelet Count: 267 10*3/uL (ref 150–400)
RBC: 4.57 MIL/uL (ref 4.22–5.81)
RDW: 16.9 % — ABNORMAL HIGH (ref 11.5–15.5)
WBC Count: 4.8 10*3/uL (ref 4.0–10.5)
nRBC: 0 % (ref 0.0–0.2)

## 2023-12-16 LAB — MAGNESIUM: Magnesium: 1.5 mg/dL — ABNORMAL LOW (ref 1.7–2.4)

## 2023-12-16 MED ORDER — HEPARIN SOD (PORK) LOCK FLUSH 100 UNIT/ML IV SOLN
250.0000 [IU] | Freq: Once | INTRAVENOUS | Status: AC
Start: 2023-12-16 — End: 2023-12-16
  Administered 2023-12-16: 250 [IU]

## 2023-12-16 MED ORDER — SODIUM CHLORIDE 0.9% FLUSH
10.0000 mL | Freq: Once | INTRAVENOUS | Status: AC
  Administered 2023-12-16: 10 mL

## 2023-12-16 NOTE — Progress Notes (Signed)
 [facility]   Telephone:(336) [phone] Fax:([phone]) [phone]    Patient Care Team: Medicine, [facility] as PCP - General Adalberto Acton, MD as Consulting Physician (General Surgery) Sonja [place], MD as Consulting Physician (Oncology)   CHIEF COMPLAINT: Follow-up colon cancer  Oncology History Overview Note   Cancer Staging  Cancer of left colon [facility]) Staging form: Colon and Rectum, AJCC 8th Edition - Pathologic stage from 08/08/2021: Stage IIIB (pT3, pN1a, cM0) - Signed by Sonja [place], MD on 08/26/2021    Cancer of left colon (HCC)  04/01/2020 Imaging   IMPRESSION: 1. Gallbladder decompressed bowel also partially calcified gallstones. No pericholecystic inflammation though if there is persisting clinical concern for cholecystitis right upper quadrant ultrasound could be obtained. 2. Circumferential thickening of the distal thoracic esophagus. Could reflect features of esophagitis. Correlate with clinical symptoms and consider endoscopy as clinically warranted. 3. Additional segmental thickening of the mid to distal sigmoid with focal narrowing. No acute surrounding inflammation or resulting obstruction. Findings are nonspecific, and could reflect sequela of prior inflammation/infection. However, recommend correlation with colonoscopy if not recently performed. 4. Mild circumferential bladder wall thickening and indentation of the bladder base by an enlarged prostate. Possibly sequela of chronic outlet obstruction though could correlate with urinalysis to exclude cystitis. 5. Aortic Atherosclerosis (ICD10-I70.0).   08/06/2021 Imaging   IMPRESSION: Sigmoid colonic perforation with small free intraperitoneal gas and infiltration of the mesenteric and omental fat in keeping with changes of peritonitis.   Long segment inflammatory stranding of the sigmoid colon in keeping with a severe infectious or inflammatory colitis. This terminates an area of  irregular mural thickening, infiltrative soft tissue within the colonic mesentery, and focal dystrophic calcification. This may represent a chronic inflammatory process, however, a perforated malignancy could appear similarly. There are 2 separate points of perforation which again raise the question of an underlying malignancy.   7.1 cm gas and fluid containing pericolonic abscess within the sigmoid mesentery.   Marked inflammatory change of the terminal ileum adjacent to the pericolonic abscess with resultant small bowel obstruction. Fluid within the distal esophagus likely relates to gastroesophageal reflux the setting of vomiting.   Aortic Atherosclerosis (ICD10-I70.0).   08/08/2021 Cancer Staging   Staging form: Colon and Rectum, AJCC 8th Edition - Pathologic stage from 08/08/2021: Stage IIIB (pT3, pN1a, cM0) - Signed by Sonja [place], MD on 08/26/2021 Stage prefix: Initial diagnosis Total positive nodes: 1 Histologic grading system: 4 grade system Histologic grade (G): G2 Residual tumor (R): R0 - None   08/08/2021 Definitive Surgery   FINAL MICROSCOPIC DIAGNOSIS:   A. COLON, SIGMOID, PARTIAL COLECTOMY:  - Invasive moderately differentiated adenocarcinoma.  - Metastatic carcinoma involving one of twelve lymph nodes (1/12).  - See oncology table below.   ADDENDUM:  Mismatch Repair Protein (IHC)  SUMMARY INTERPRETATION: NORMAL    08/14/2021 Imaging   EXAM: CT ABDOMEN AND PELVIS WITH CONTRAST  IMPRESSION: 1. Post recent sigmoid colectomy with left lower quadrant colostomy. Two small residual foci of air within the pelvic mesentery with mild adjacent thickening, but no abscess or drainable collection. Trace non organized free fluid and stranding in the pelvis. 2. Short segment of small bowel wall thickening and inflammation in the pelvis involving the distal ileum, likely reactive. 3. Dilated distal esophagus, stomach, and small bowel, without discrete transition point, favoring  postoperative ileus. 4. Small bilateral pleural effusions and compressive atelectasis. 5. Heterogeneous partially enhancing 14 mm lymph node in the retroperitoneum at  the aortoiliac bifurcation, not significantly changed from prior exam. Suspected additional lymph nodes in the anterior common iliac space, not significantly changed from prior exam. Recommend attention at follow-up. 6. Additional chronic findings as described.     08/15/2021 Imaging   EXAM: CT CHEST WITH CONTRAST  IMPRESSION: 1. No evidence of thoracic metastasis. 2. Bilateral small layering pleural effusions with passive atelectasis   08/26/2021 Initial Diagnosis   Cancer of left colon (HCC)   10/10/2021 - 12/12/2021 Chemotherapy   Patient is on Treatment Plan : COLORECTAL Xelox (Capeox) q21d     05/28/2022 - 05/28/2022 Chemotherapy   Patient is on Treatment Plan : COLORECTAL FOLFIRI + Bevacizumab q14d     05/28/2022 -  Chemotherapy   Patient is on Treatment Plan : COLORECTAL FOLFIRI + Panitumumab  q14d     07/31/2022 Imaging    IMPRESSION: Decreased bilateral pulmonary metastases.   Stable mild abdominal lymphadenopathy.   No new or progressive metastatic disease within the chest, abdomen, or pelvis.   01/18/2023 Imaging    IMPRESSION: 1. Status post Esther Hem pouch sigmoid colon resection with left lower quadrant end colostomy and rectal stump. Unchanged, mild wall thickening of the decompressed distal colon at the ileostomy, as well as the rectal stump. 2. Unchanged small, treated metastatic pulmonary nodules. No new nodules. 3. Unchanged enlarged, coarsely calcified treated lower aortocaval metastatic lymph nodes. No new lymphadenopathy. 4. Coronary artery disease. 5. Cholelithiasis.   04/23/2023 Imaging   CT chest, abdomen, and pelvis with contrast  IMPRESSION: 1. Left lower lobe segmental pulmonary emboli, incidental finding. No evidence for right heart strain. 2. Stable trace bilateral pleural  effusions. 3. Stable enlarged lower left paraesophageal lymph node. 4. Stable calcified retroperitoneal lymph nodes. No new enlarged lymph nodes in the chest, abdomen or pelvis. 5. Stable subcentimeter soft tissue nodules within the mesenteric fat along the inferior ostomy. 6. Left Bosniak I benign renal cyst measuring 4.6 cm. No follow-up imaging is recommended. JACR 2018 Feb; 264-273, Management of the Incidental Renal Mass on CT, [employer] 2021; 814-848, Bosniak Classification of Cystic Renal Masses, Version 2019.   07/15/2023 Imaging   CT chest abdomen and pelvis with contrast IMPRESSION: 1. Prior partial left hemicolectomy with end colostomy in the left anterior abdominal wall. Unchanged mild wall thickening of the decompressed distal colon at the ileostomy as well as the rectal stump/Hartmann's pouch. 2. Stable enlarged left lower paraesophageal lymph node. 3. Increased size of the treated pulmonary nodule in the lingula now measuring 6 mm previously 4 mm with increased in size favored artifactual given motion degrading examination of the lung bases. Suggest attention on follow-up imaging 4. Unchanged size of the treated pulmonary nodule in the right middle lobe measuring 4 mm. 5. Increased size of prominent bilateral axillary lymph nodes measuring up to 7 mm in short axis, nonspecific. Suggest attention on follow-up imaging 6. No convincing evidence of new or progressive disease in the chest, abdomen or pelvis. 7.  Aortic Atherosclerosis (ICD10-I70.0).     10/06/2023 Imaging   CT chest abdomen pelvis with contrast IMPRESSION: 1. Increased size of the pulmonary nodule in the lingula now measuring 7 mm, concerning for pulmonary metastatic disease recurrence. 2. Additional bilateral pulmonary nodules are stable. 3. Stable prominent bilateral axillary, low paraesophageal and retroperitoneal lymph nodes. 4. Prior partial left hemicolectomy with Hartmann's pouch formation and left anterior  abdominal wall colostomy. No suspicious nodularity along the Hartmann's suture line. 5. Patulous esophagus with symmetric distal esophageal wall thickening, similar prior. 6. Trace right  pleural effusion. 7.  Aortic Atherosclerosis (ICD10-I70.0).        CURRENT THERAPY: Second line maintenance chemotherapy 5FU/leuc and Vectibix    INTERVAL HISTORY Mr. Madden returns for follow-up, transportation was late.  Continues 5-FU/Leuc and Vectibix  every 2 weeks.  Tolerating treatment well without significant issues.  Denies neuropathy.  Bowels moving well, eating and drinking.  Denies pain, fever, rash, or any other new or specific complaints.  ROS  All other systems reviewed and negative  Past Medical History:  Diagnosis Date   Cancer (HCC)    Schizophrenia [facility]      Past Surgical History:  Procedure Laterality Date   BRONCHIAL BIOPSY  05/11/2022   Procedure: BRONCHIAL BIOPSIES;  Surgeon: Denson Flake, MD;  Location: [facility] ENDOSCOPY;  Service: Pulmonary;;   BRONCHIAL BRUSHINGS  05/11/2022   Procedure: BRONCHIAL BRUSHINGS;  Surgeon: Denson Flake, MD;  Location: [facility] ENDOSCOPY;  Service: Pulmonary;;   BRONCHIAL NEEDLE ASPIRATION BIOPSY  05/11/2022   Procedure: BRONCHIAL NEEDLE ASPIRATION BIOPSIES;  Surgeon: Denson Flake, MD;  Location: [facility] ENDOSCOPY;  Service: Pulmonary;;   BRONCHIAL WASHINGS  05/11/2022   Procedure: BRONCHIAL WASHINGS;  Surgeon: Denson Flake, MD;  Location: [facility] ENDOSCOPY;  Service: Pulmonary;;   IR IMAGING GUIDED PORT INSERTION  05/25/2022   LAPAROTOMY N/A 08/08/2021   Procedure: EXPLORATORY LAPAROTOMY;  Surgeon: Adalberto Acton, MD;  Location: [facility] OR;  Service: General;  Laterality: N/A;   PARTIAL COLECTOMY N/A 08/08/2021   Procedure: PARTIAL COLECTOMY WITH COLOSTOMY;  Surgeon: Adalberto Acton, MD;  Location: [facility] OR;  Service: General;  Laterality: N/A;   VIDEO BRONCHOSCOPY WITH RADIAL ENDOBRONCHIAL ULTRASOUND  05/11/2022   Procedure: VIDEO BRONCHOSCOPY WITH RADIAL  ENDOBRONCHIAL ULTRASOUND;  Surgeon: Denson Flake, MD;  Location: [facility] ENDOSCOPY;  Service: Pulmonary;;     Outpatient Encounter Medications as of 12/16/2023  Medication Sig Note   acetaminophen  (TYLENOL ) 500 MG tablet Take 2 tablets (1,000 mg total) by mouth every 6 (six) hours as needed for mild pain or moderate pain.    apixaban  (ELIQUIS ) 5 MG TABS tablet Take 1 tablet (5 mg total) by mouth 2 (two) times daily.    aspirin EC 81 MG tablet Take 81 mg by mouth daily as needed (for pain or headaches).     benztropine  (COGENTIN ) 1 MG tablet Take 1 mg at bedtime by mouth.    clindamycin  (CLINDAGEL) 1 % gel Apply topically 2 (two) times daily. To skin rash on face and upper body    ferrous sulfate  325 (65 FE) MG EC tablet Take 1 tablet (325 mg total) by mouth daily.    hydrocortisone  cream 1 % Apply 1 Application topically 2 (two) times daily as needed for itching. For rash    loperamide  (IMODIUM ) 2 MG capsule Take 1 capsule (2 mg total) by mouth as needed for diarrhea or loose stools.    magnesium  oxide (MAG-OX) 400 (240 Mg) MG tablet Take 1 tablet (400 mg total) by mouth daily.    Multiple Vitamin (MULTIVITAMIN WITH MINERALS) TABS tablet Take 1 tablet by mouth daily.    ondansetron  (ZOFRAN ) 8 MG tablet Take 1 tablet (8 mg total) by mouth every 8 (eight) hours as needed for nausea or vomiting.    paliperidone  (INVEGA  SUSTENNA) 156 MG/ML SUSP injection Inject 156 mg every 30 (thirty) days into the muscle. 05/25/2022: Due tomorrow   polyethylene glycol (MIRALAX  / GLYCOLAX ) 17 g packet Take 17 g by mouth daily as needed for mild constipation or moderate constipation.  potassium chloride  SA (KLOR-CON  M) 20 MEQ tablet Take 1 tablet (20 mEq total) by mouth daily.    prochlorperazine  (COMPAZINE ) 10 MG tablet TAKE 1 TABLET(10 MG) BY MOUTH EVERY 6 HOURS AS NEEDED FOR NAUSEA OR VOMITING    No facility-administered encounter medications on file as of 12/16/2023.     Today's Vitals   12/16/23 1304  BP:  120/72  Pulse: 89  Resp: 17  Temp: 97.8 F (36.6 C)  SpO2: 98%  Weight: 149 lb (67.6 kg)   Body mass index is 22 kg/m.   ECOG PERFORMANCE STATUS: 0 - Asymptomatic  PHYSICAL EXAM GENERAL:alert, no distress and comfortable SKIN: no rash  EYES: sclera clear NECK: without mass LYMPH:  no palpable cervical or supraclavicular lymphadenopathy  LUNGS: clear with normal breathing effort HEART: regular rate & rhythm, no lower extremity edema ABDOMEN: abdomen soft, non-tender and normal bowel sounds NEURO: alert & oriented x 3 with fluent speech, no focal motor/sensory deficits PAC without erythema    CBC    Latest Ref Rng & Units 12/02/2023   11:04 AM 11/18/2023   10:52 AM 10/28/2023   10:53 AM  CBC  WBC 4.0 - 10.5 K/uL 4.5  5.4  3.5   Hemoglobin 13.0 - 17.0 g/dL 16.1  09.6  04.5   Hematocrit 39.0 - 52.0 % 39.1  40.4  40.5   Platelets 150 - 400 K/uL 306  287  298       CMP     Latest Ref Rng & Units 12/02/2023   11:04 AM 11/18/2023   10:52 AM 10/28/2023   10:53 AM  CMP  Glucose 70 - 99 mg/dL 409  811  91   BUN 6 - 20 mg/dL 10  8  8    Creatinine 0.61 - 1.24 mg/dL 9.14  7.82  9.56   Sodium 135 - 145 mmol/L 138  140  139   Potassium 3.5 - 5.1 mmol/L 3.5  3.5  3.8   Chloride 98 - 111 mmol/L 106  108  107   CO2 22 - 32 mmol/L 26  26  26    Calcium  8.9 - 10.3 mg/dL 9.0  9.3  9.2   Total Protein 6.5 - 8.1 g/dL 6.9  6.9  7.0   Total Bilirubin 0.0 - 1.2 mg/dL 0.8  0.8  0.9   Alkaline Phos 38 - 126 U/L 102  117  106   AST 15 - 41 U/L 16  25  17    ALT 0 - 44 U/L 14  26  13        ASSESSMENT & PLAN:Jonaven Massengill is a 58 y.o. male with    1. Cancer of left colon, stage IIIB p(T3, N1aM0), MSS -presented to ED on 08/06/21 with sigmoid colon perforation, s/p emergent partial colectomy on 08/08/21 by Dr. Jamse Mcgee showed invasive moderately differentiated adenocarcinoma. Margins negative, one lymph node showed metastatic carcinoma (1/12). -Staging chest CT 08/15/21 was negative for metastatic  disease. -he completed 4 cycles adjuvant CAPOX on 10/10/21 - May or July 2023 -Surveillance scan 01/2022 showed pulmonary nodules and retroperitoneal adenopathy, concerning for recurrent/metastatic disease -Lung biopsy confirmed malignant cells consistent with colonic primary -He began first-line FOLFIRI in 05/2022, Vectibix  added with cycle 2.  Tolerated well with good response on CT from 07/31/2022 and again with continued response in lungs and lymph nodes on CT CAP from 10/26/2022.   -Changed to maintenance 5FU/leuc and vectibix  q2 weeks, on 10/28/22 - Restaging CT 10/06/2023 showed stable disease except increased size  of the pulmonary nodule in the lingula, now measuring 7 mm -Mr. Diersen appears stable, he continues 5-FU/leuc and Vectibix , tolerating well with no significant side effects.  He is able to recover and function well maintain adequate performance status.  There is no clinical evidence of disease progression -If labs meet parameters he will return for treatment plus mag on 5/19, will adjust future treatment cycles -Plan to restage this cycle  2.  Pulmonary emboli -Incidental finding on restaging CT 04/23/2023.  Asymptomatic -On Eliquis , tolerating well   3. Anemia, likely iron deficient -chronic prior to diagnosis, worsened with surgery -he is currently on oral iron. -overall mild and stable    4. Schizophrenia, disabled, social support -he was diagnosed with schizophrenia in his 20's, managed with medication, he is on disability. -he lives with his mother; he is able to do ADLs for himself, but not iADLS. They are both on disability.    PLAN: -Labs pending, if within parameters return for 5FU/leuc + vectibix  on 5/19 -Pump d/c and IV Mag 5/21 -Restage after this cycle -Adjust future dates per IS, restage in 2 weeks, f/up and next cycle in ~3 weeks   Orders Placed This Encounter  Procedures   Hemoglobin A1c    Standing Status:   Future    Expected Date:   12/16/2023     Expiration Date:   12/15/2024      All questions were answered. The patient knows to call the clinic with any problems, questions or concerns. No barriers to learning were detected.   Rashea Hoskie K Yon Schiffman, NP 12/16/2023

## 2023-12-17 ENCOUNTER — Other Ambulatory Visit: Payer: Self-pay

## 2023-12-18 ENCOUNTER — Inpatient Hospital Stay: Payer: MEDICAID

## 2023-12-20 ENCOUNTER — Ambulatory Visit: Payer: Self-pay | Admitting: Nurse Practitioner

## 2023-12-20 ENCOUNTER — Inpatient Hospital Stay: Payer: MEDICAID

## 2023-12-20 VITALS — BP 105/69 | HR 86 | Temp 97.7°F | Resp 16 | Wt 150.2 lb

## 2023-12-20 DIAGNOSIS — Z5112 Encounter for antineoplastic immunotherapy: Secondary | ICD-10-CM | POA: Diagnosis not present

## 2023-12-20 DIAGNOSIS — C186 Malignant neoplasm of descending colon: Secondary | ICD-10-CM

## 2023-12-20 MED ORDER — MAGNESIUM SULFATE 4 GM/100ML IV SOLN
4.0000 g | Freq: Once | INTRAVENOUS | Status: AC
  Administered 2023-12-20: 4 g via INTRAVENOUS
  Filled 2023-12-20: qty 100

## 2023-12-20 MED ORDER — LEUCOVORIN CALCIUM INJECTION 350 MG
400.0000 mg/m2 | Freq: Once | INTRAMUSCULAR | Status: AC
  Administered 2023-12-20: 728 mg via INTRAVENOUS
  Filled 2023-12-20: qty 36.4

## 2023-12-20 MED ORDER — SODIUM CHLORIDE 0.9 % IV SOLN
2400.0000 mg/m2 | INTRAVENOUS | Status: DC
  Administered 2023-12-20: 4350 mg via INTRAVENOUS
  Filled 2023-12-20: qty 87

## 2023-12-20 MED ORDER — SODIUM CHLORIDE 0.9 % IV SOLN: Freq: Once | INTRAVENOUS | Status: AC

## 2023-12-20 MED ORDER — SODIUM CHLORIDE 0.9 % IV SOLN
6.0000 mg/kg | Freq: Once | INTRAVENOUS | Status: AC
  Administered 2023-12-20: 400 mg via INTRAVENOUS
  Filled 2023-12-20: qty 20

## 2023-12-20 MED ORDER — SODIUM CHLORIDE 0.9% FLUSH: 10.0000 mL | INTRAVENOUS | Status: DC | PRN

## 2023-12-20 NOTE — Patient Instructions (Signed)
 [facility] - A DEPT OF [facility]  Discharge Instructions: Thank you for choosing [facility] to provide your oncology and hematology care.   If you have a lab appointment with the Cancer Center, please go directly to the Cancer Center and check in at the registration area.   Wear comfortable clothing and clothing appropriate for easy access to any Portacath or PICC line.   We strive to give you quality time with your provider. You may need to reschedule your appointment if you arrive late (15 or more minutes).  Arriving late affects you and other patients whose appointments are after yours.  Also, if you miss three or more appointments without notifying the office, you may be dismissed from the clinic at the provider's discretion.      For prescription refill requests, have your pharmacy contact our office and allow 72 hours for refills to be completed.    Today you received the following chemotherapy and/or immunotherapy agents: Vectibix /Leucovorin /Fluorouracil        To help prevent nausea and vomiting after your treatment, we encourage you to take your nausea medication as directed.  BELOW ARE SYMPTOMS THAT SHOULD BE REPORTED IMMEDIATELY: *FEVER GREATER THAN 100.4 F (38 C) OR HIGHER *CHILLS OR SWEATING *NAUSEA AND VOMITING THAT IS NOT CONTROLLED WITH YOUR NAUSEA MEDICATION *UNUSUAL SHORTNESS OF BREATH *UNUSUAL BRUISING OR BLEEDING *URINARY PROBLEMS (pain or burning when urinating, or frequent urination) *BOWEL PROBLEMS (unusual diarrhea, constipation, pain near the anus) TENDERNESS IN MOUTH AND THROAT WITH OR WITHOUT PRESENCE OF ULCERS (sore throat, sores in mouth, or a toothache) UNUSUAL RASH, SWELLING OR PAIN  UNUSUAL VAGINAL DISCHARGE OR ITCHING   Items with * indicate a potential emergency and should be followed up as soon as possible or go to the Emergency Department if any problems should occur.  Please show the CHEMOTHERAPY  ALERT CARD or IMMUNOTHERAPY ALERT CARD at check-in to the Emergency Department and triage nurse.  Should you have questions after your visit or need to cancel or reschedule your appointment, please contact [facility] - A DEPT OF [facility]  Dept: [phone]  and follow the prompts.  Office hours are 8:00 a.m. to 4:30 p.m. Monday - Friday. Please note that voicemails left after 4:00 p.m. may not be returned until the following business day.  We are closed weekends and major holidays. You have access to a nurse at all times for urgent questions. Please call the main number to the clinic Dept: [phone] and follow the prompts.   For any non-urgent questions, you may also contact your provider using MyChart. We now offer e-Visits for anyone 1 and older to request care online for non-urgent symptoms. For details visit [email or web address].   Also download the MyChart app! Go to the app store, search "MyChart", open the app, select [place], and log in with your MyChart username and password.  The chemotherapy medication bag should finish at 46 hours, 96 hours, or 7 days. For example, if your pump is scheduled for 46 hours and it was put on at 4:00 p.m., it should finish at 2:00 p.m. the day it is scheduled to come off regardless of your appointment time.     Estimated time to finish at: 2:00 PM on 12/04/23   If the display on your pump reads "Low Volume" and it is beeping, take the batteries out of the pump and come to the cancer  center for it to be taken off.   If the pump alarms go off prior to the pump reading "Low Volume" then call [phone] and someone can assist you.  If the plunger comes out and the chemotherapy medication is leaking out, please use your home chemo spill kit to clean up the spill. Do NOT use paper towels or other household products.  If you have problems or questions regarding your pump, please call either [phone] (24  hours a day) or the cancer center Monday-Friday 8:00 a.m.- 4:30 p.m. at the clinic number and we will assist you. If you are unable to get assistance, then go to the nearest Emergency Department and ask the staff to contact the IV team for assistance.

## 2023-12-21 ENCOUNTER — Encounter: Payer: Self-pay | Admitting: Hematology

## 2023-12-21 ENCOUNTER — Encounter: Payer: Self-pay | Admitting: Nurse Practitioner

## 2023-12-21 NOTE — Telephone Encounter (Addendum)
 Called patients mother to relay the message below as per Lacie Burton NP, she voiced full understanding and had no further questions at this time.  ----- Message from Lacie K Burton sent at 12/20/2023  8:33 AM EDT ----- Please let pt's mother know A1c result, he does not have diabetes.   Thanks Lacie NP

## 2023-12-22 ENCOUNTER — Inpatient Hospital Stay: Payer: MEDICAID

## 2023-12-22 VITALS — BP 106/80 | HR 98 | Temp 97.7°F | Resp 16

## 2023-12-22 DIAGNOSIS — C186 Malignant neoplasm of descending colon: Secondary | ICD-10-CM

## 2023-12-22 DIAGNOSIS — Z5112 Encounter for antineoplastic immunotherapy: Secondary | ICD-10-CM | POA: Diagnosis not present

## 2023-12-22 MED ORDER — MAGNESIUM SULFATE 2 GM/50ML IV SOLN
2.0000 g | Freq: Once | INTRAVENOUS | Status: AC
  Administered 2023-12-22: 2 g via INTRAVENOUS
  Filled 2023-12-22: qty 50

## 2023-12-22 MED ORDER — SODIUM CHLORIDE 0.9% FLUSH
10.0000 mL | INTRAVENOUS | Status: DC | PRN
Start: 2023-12-22 — End: 2023-12-22
  Administered 2023-12-22: 10 mL

## 2023-12-22 MED ORDER — HEPARIN SOD (PORK) LOCK FLUSH 100 UNIT/ML IV SOLN
500.0000 [IU] | Freq: Once | INTRAVENOUS | Status: AC | PRN
  Administered 2023-12-22: 500 [IU]

## 2023-12-22 NOTE — Patient Instructions (Signed)
 Magnesium Sulfate Injection What is this medication? MAGNESIUM SULFATE (mag NEE zee um SUL fate) prevents and treats low levels of magnesium in your body. It may also be used to prevent and treat seizures during pregnancy in people with high blood pressure disorders, such as preeclampsia or eclampsia. Magnesium plays an important role in maintaining the health of your muscles and nervous system. This medicine may be used for other purposes; ask your health care provider or pharmacist if you have questions. What should I tell my care team before I take this medication? They need to know if you have any of these conditions: Heart disease History of irregular heart beat Kidney disease An unusual or allergic reaction to magnesium sulfate, medications, foods, dyes, or preservatives Pregnant or trying to get pregnant Breast-feeding How should I use this medication? This medication is for infusion into a vein. It is given in a hospital or clinic setting. Talk to your care team about the use of this medication in children. While this medication may be prescribed for selected conditions, precautions do apply. Overdosage: If you think you have taken too much of this medicine contact a poison control center or emergency room at once. NOTE: This medicine is only for you. Do not share this medicine with others. What if I miss a dose? This does not apply. What may interact with this medication? Certain medications for anxiety or sleep Certain medications for seizures, such phenobarbital Digoxin Medications that relax muscles for surgery Narcotic medications for pain This list may not describe all possible interactions. Give your health care provider a list of all the medicines, herbs, non-prescription drugs, or dietary supplements you use. Also tell them if you smoke, drink alcohol, or use illegal drugs. Some items may interact with your medicine. What should I watch for while using this  medication? Your condition will be monitored carefully while you are receiving this medication. You may need blood work done while you are receiving this medication. What side effects may I notice from receiving this medication? Side effects that you should report to your care team as soon as possible: Allergic reactions--skin rash, itching, hives, swelling of the face, lips, tongue, or throat High magnesium level--confusion, drowsiness, facial flushing, redness, sweating, muscle weakness, fast or irregular heartbeat, trouble breathing Low blood pressure--dizziness, feeling faint or lightheaded, blurry vision Side effects that usually do not require medical attention (report to your care team if they continue or are bothersome): Headache Nausea This list may not describe all possible side effects. Call your doctor for medical advice about side effects. You may report side effects to FDA at 1-800-FDA-1088. Where should I keep my medication? This medication is given in a hospital or clinic and will not be stored at home. NOTE: This sheet is a summary. It may not cover all possible information. If you have questions about this medicine, talk to your doctor, pharmacist, or health care provider.  2024 Elsevier/Gold Standard (2021-04-02 00:00:00)

## 2023-12-23 ENCOUNTER — Other Ambulatory Visit: Payer: Self-pay

## 2023-12-26 IMAGING — DX DG ABD PORTABLE 1V
1 series · 1 of 1 positions shown · non-contrast
Comparison: None.

CLINICAL DATA: Nasogastric tube placement

EXAM:
PORTABLE ABDOMEN - 1 VIEW

[abdomen]
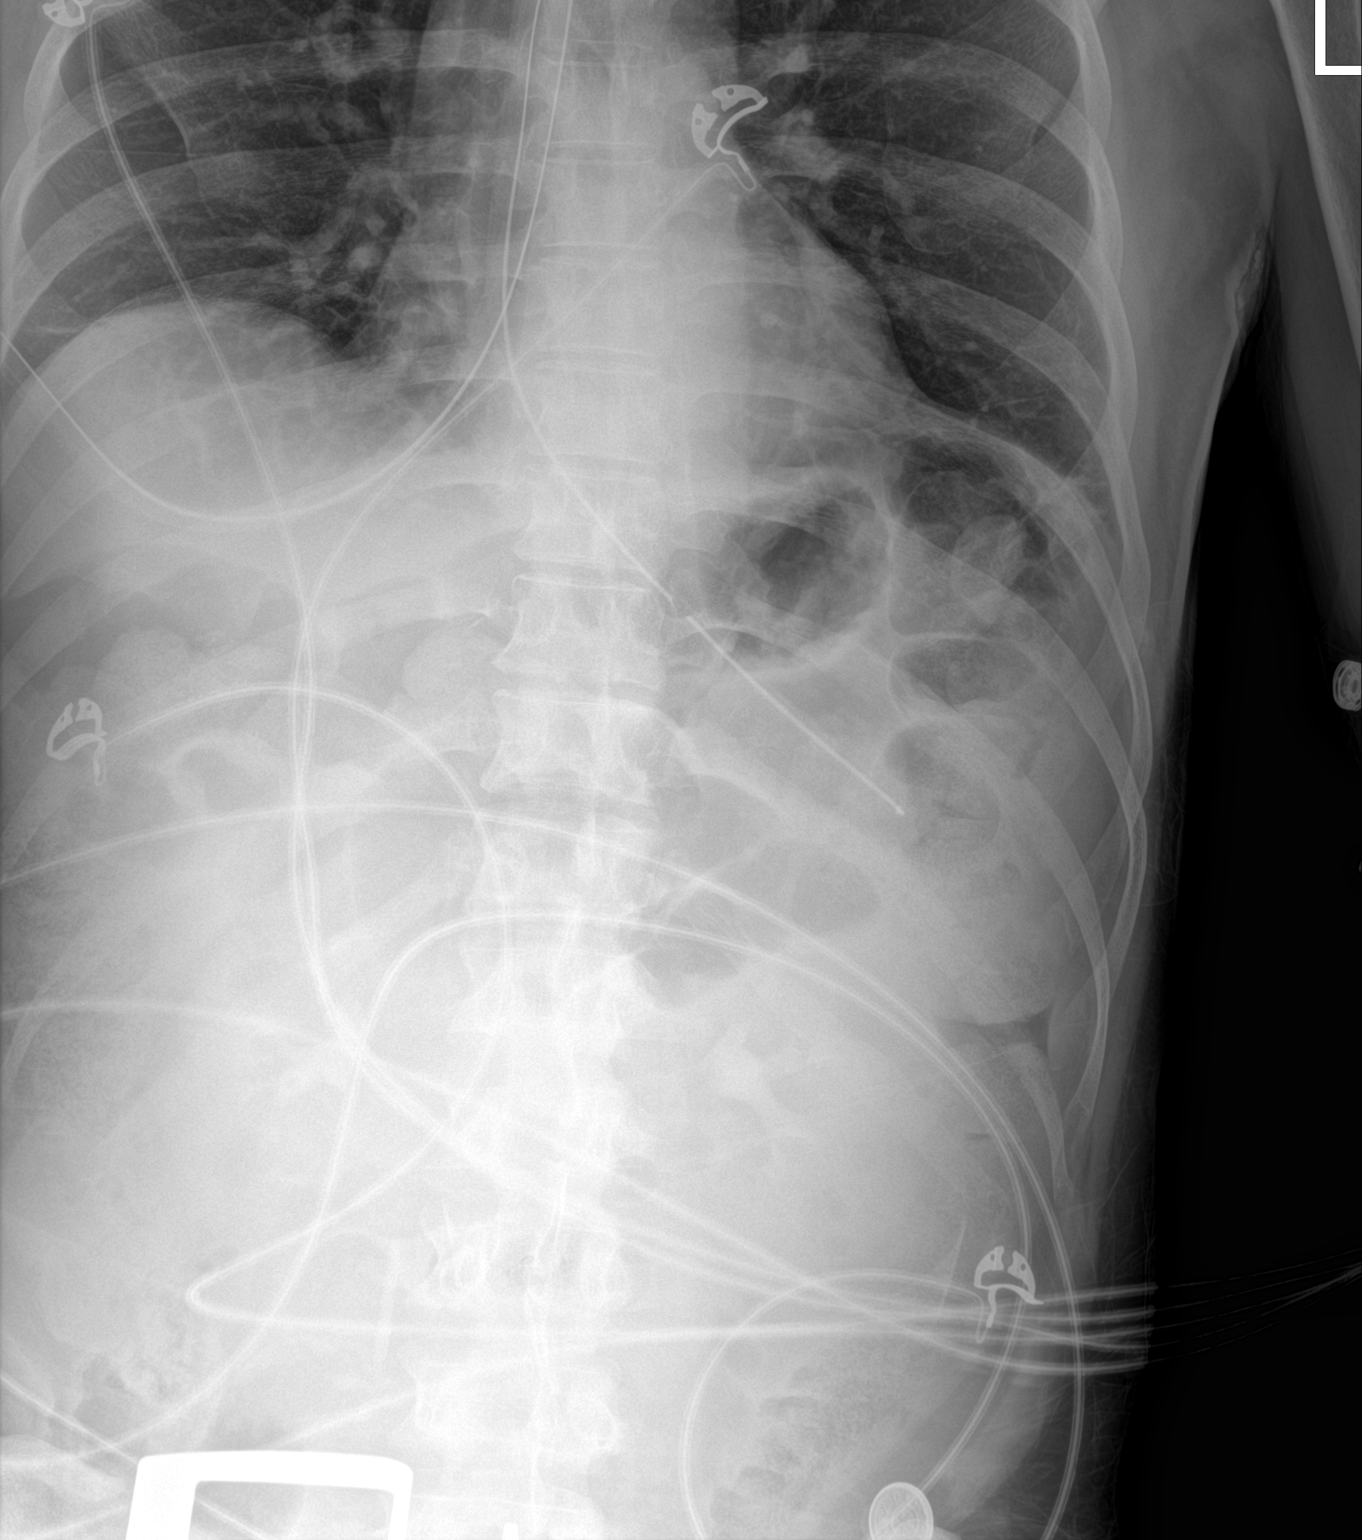

[1 of 1 positions shown; findings below may reference images not displayed]

FINDINGS: Nasogastric tube tip overlies the proximal body of the stomach.
Visualized abdominal gas pattern is nonobstructive. No gross free
intraperitoneal gas.
IMPRESSION: Nasogastric tube tip within the proximal body of the stomach.

## 2023-12-26 IMAGING — DX DG CHEST 1V PORT
1 series · 1 of 1 positions shown · non-contrast
Comparison: Earlier today

CLINICAL DATA: Preop exam. Schizophrenia. Nasogastric tube
placement.

EXAM:
PORTABLE CHEST 1 VIEW

[chest ap]
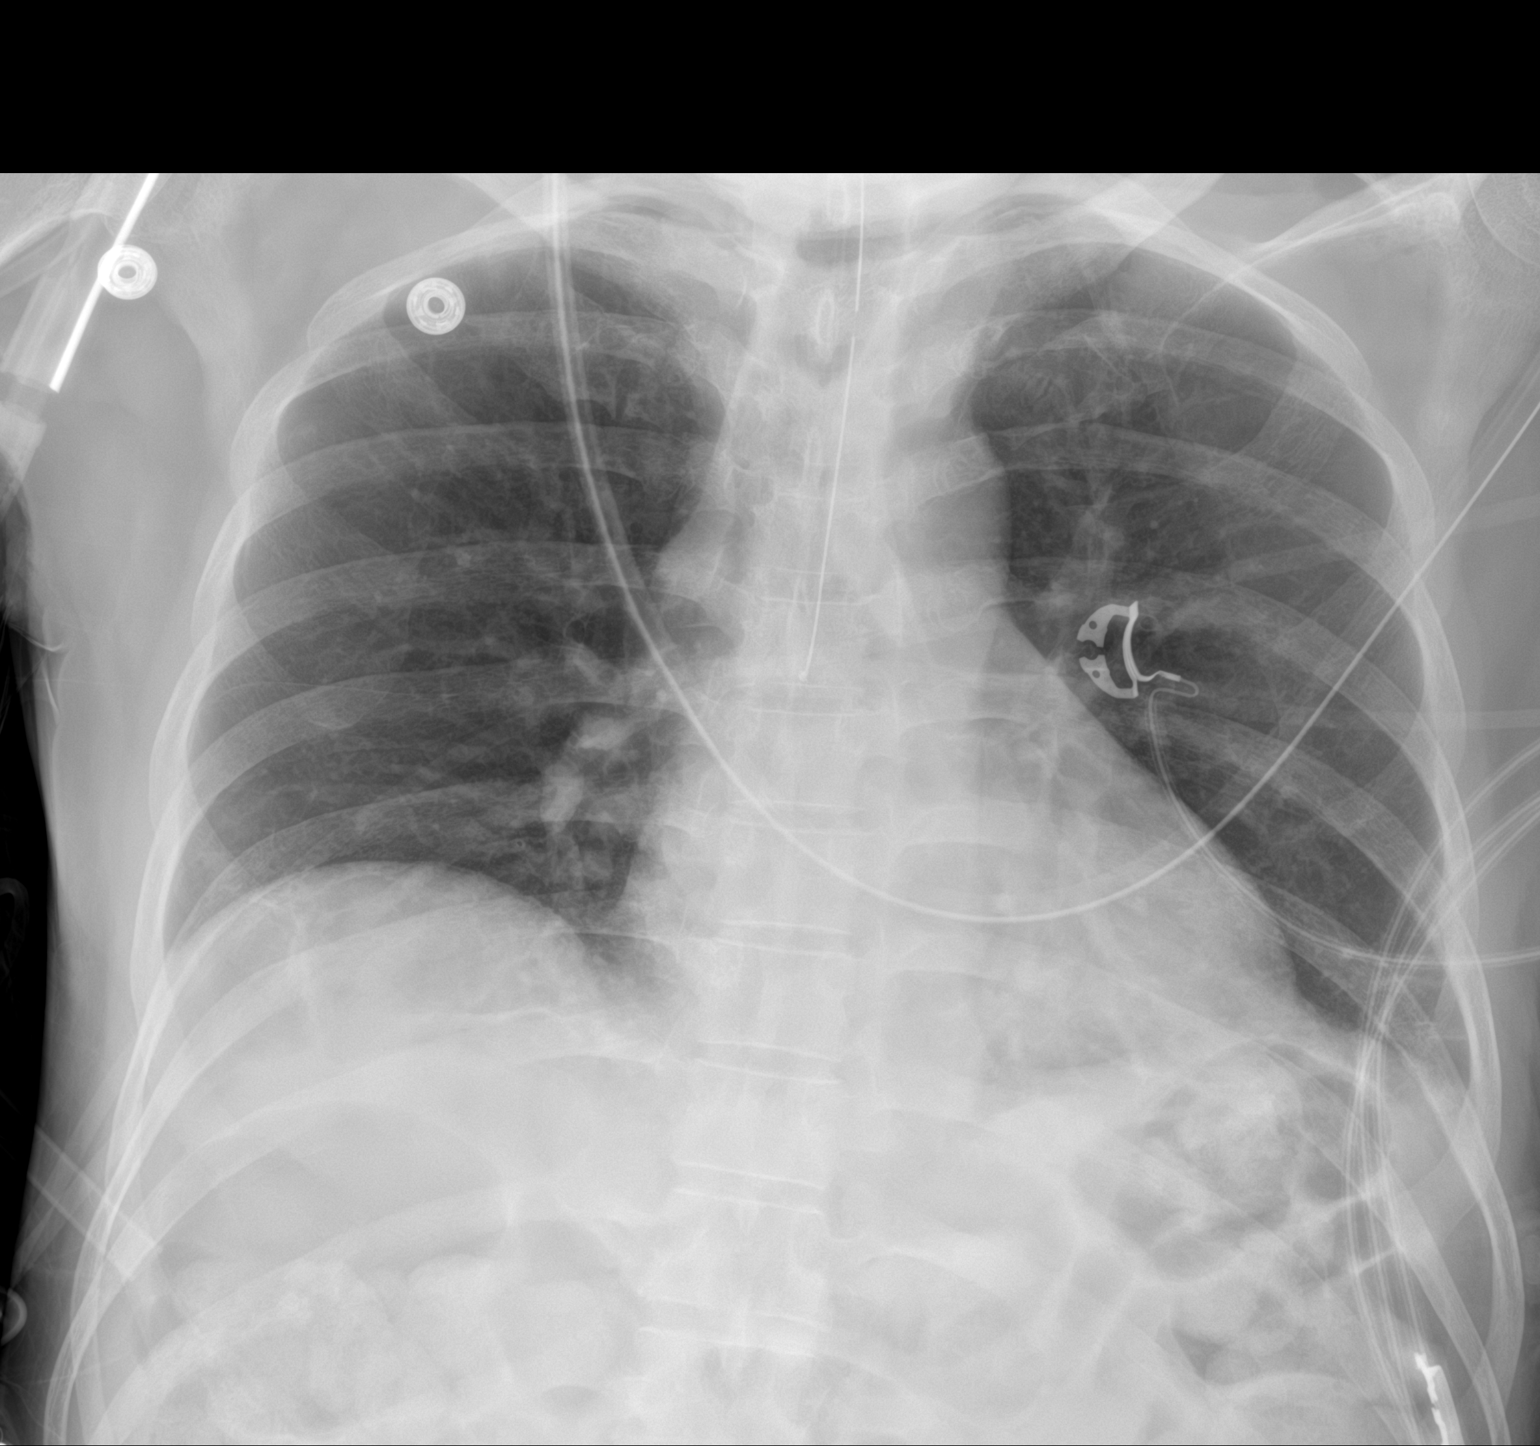

[1 of 1 positions shown; findings below may reference images not displayed]

FINDINGS: The nasogastric tube has been retracted. The tip of the tube is 1 cm
below the carina. Stable cardiomediastinal contours. No pleural
effusion or edema. No airspace opacities identified.
IMPRESSION: 1. No acute cardiopulmonary abnormalities.
2. The NG tube has been retracted and the tip is in the mid
esophagus approximately 1 cm below the level of the carina.

## 2023-12-28 ENCOUNTER — Telehealth: Payer: Self-pay | Admitting: Hematology

## 2023-12-30 ENCOUNTER — Inpatient Hospital Stay: Payer: MEDICAID | Admitting: Hematology

## 2023-12-30 ENCOUNTER — Ambulatory Visit (HOSPITAL_COMMUNITY)
Admission: RE | Admit: 2023-12-30 | Discharge: 2023-12-30 | Disposition: A | Discharge status: Home / Self Care | Payer: MEDICAID | Source: Ambulatory Visit | Attending: Hematology | Admitting: Hematology

## 2023-12-30 ENCOUNTER — Inpatient Hospital Stay: Payer: MEDICAID

## 2023-12-30 DIAGNOSIS — C186 Malignant neoplasm of descending colon: Secondary | ICD-10-CM | POA: Diagnosis present

## 2023-12-30 MED ORDER — IOHEXOL 9 MG/ML PO SOLN
ORAL | Status: AC
  Filled 2023-12-30: qty 1000

## 2023-12-30 MED ORDER — SODIUM CHLORIDE (PF) 0.9 % IJ SOLN
INTRAMUSCULAR | Status: AC
  Filled 2023-12-30: qty 50

## 2023-12-30 MED ORDER — IOHEXOL 9 MG/ML PO SOLN
1000.0000 mL | Freq: Once | ORAL | Status: AC
  Administered 2023-12-30: 1000 mL via ORAL

## 2023-12-30 MED ORDER — IOHEXOL 300 MG/ML  SOLN
100.0000 mL | Freq: Once | INTRAMUSCULAR | Status: AC | PRN
  Administered 2023-12-30: 100 mL via INTRAVENOUS

## 2024-01-01 ENCOUNTER — Inpatient Hospital Stay: Payer: MEDICAID

## 2024-01-01 IMAGING — DX DG ABD PORTABLE 1V
1 series · 1 of 1 positions shown · non-contrast
Comparison: Radiograph dated August 07, 2021

CLINICAL DATA: Ileus

EXAM:
PORTABLE ABDOMEN - 1 VIEW

[abdomen supine]
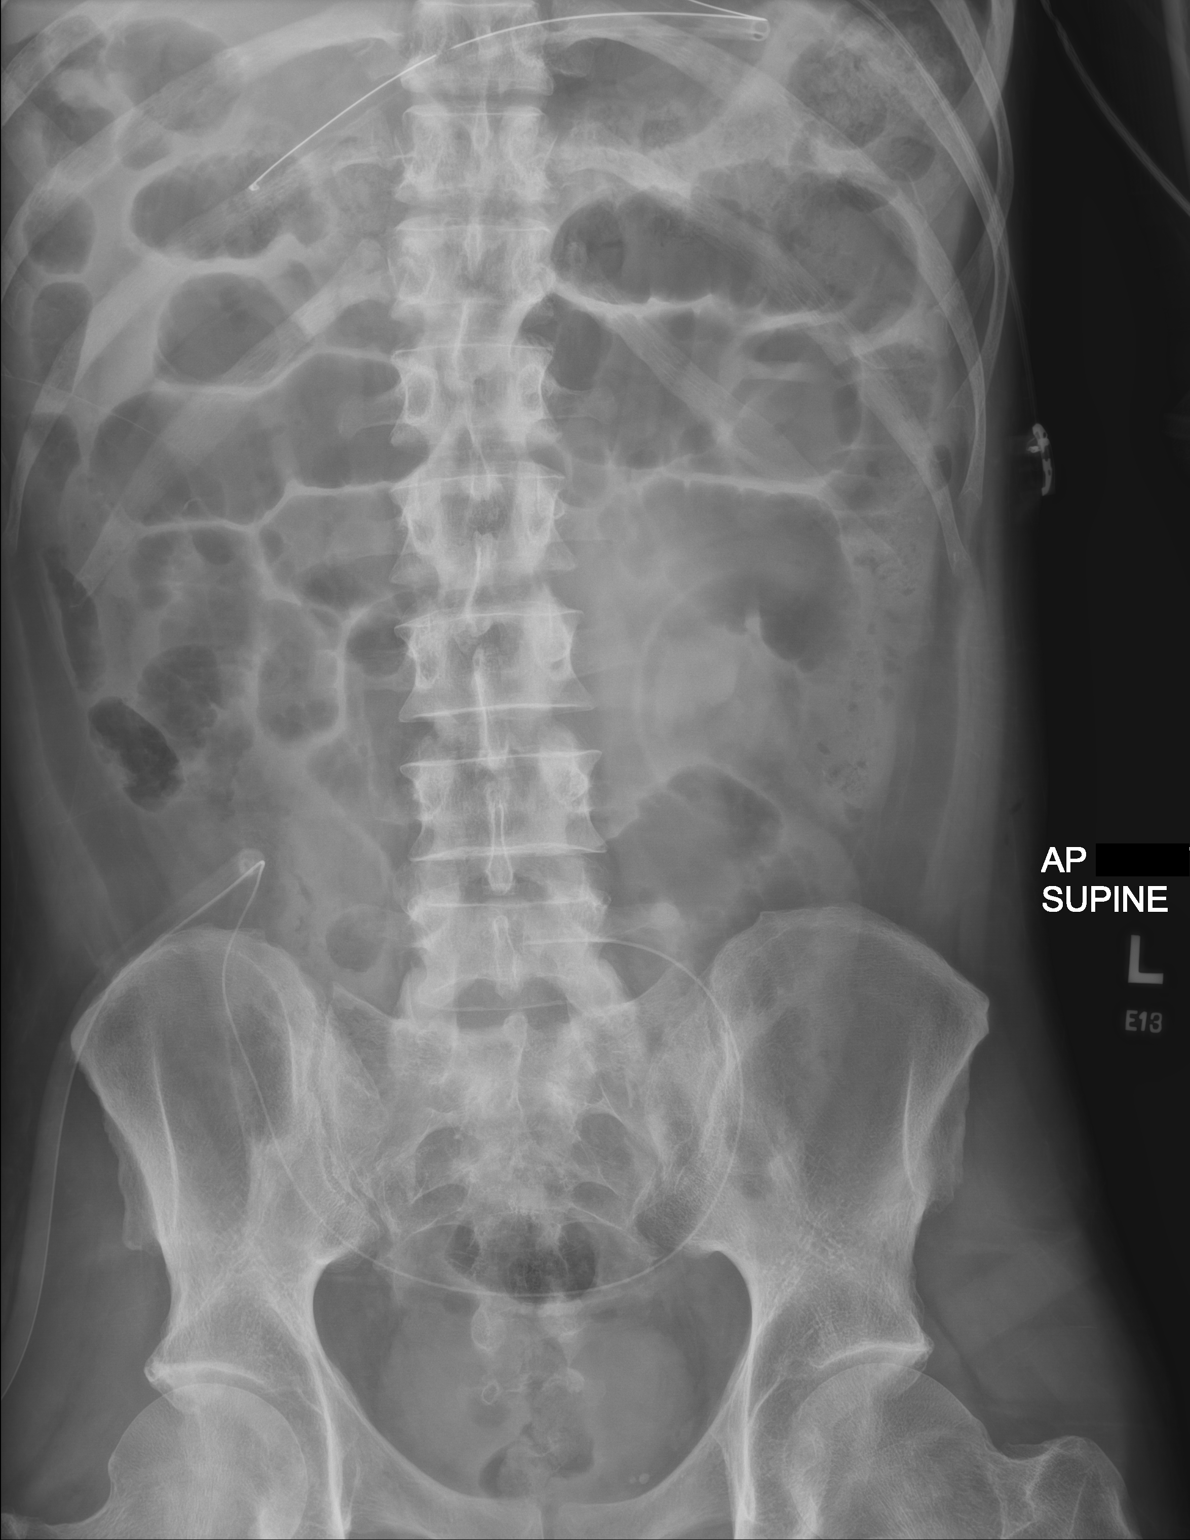

[1 of 1 positions shown; findings below may reference images not displayed]

FINDINGS: NG/OG tube tip and side port project over the distal stomach. Drain
overlying the lower abdomen. Numerous dilated loops of small and
large bowel again seen.
IMPRESSION: Numerous dilated loops of small and large bowel are similar to prior
exam and likely due to ileus.

## 2024-01-02 ENCOUNTER — Other Ambulatory Visit: Payer: Self-pay | Admitting: Nurse Practitioner

## 2024-01-02 DIAGNOSIS — C186 Malignant neoplasm of descending colon: Secondary | ICD-10-CM

## 2024-01-02 IMAGING — DX DG ABD PORTABLE 1V
1 series · 1 of 1 positions shown · non-contrast
Comparison: Radiographs dated August 14, 2021 at [DATE] a.m.

CLINICAL DATA: NG tube placement

EXAM:
PORTABLE ABDOMEN - 1 VIEW

[abdomen kub]
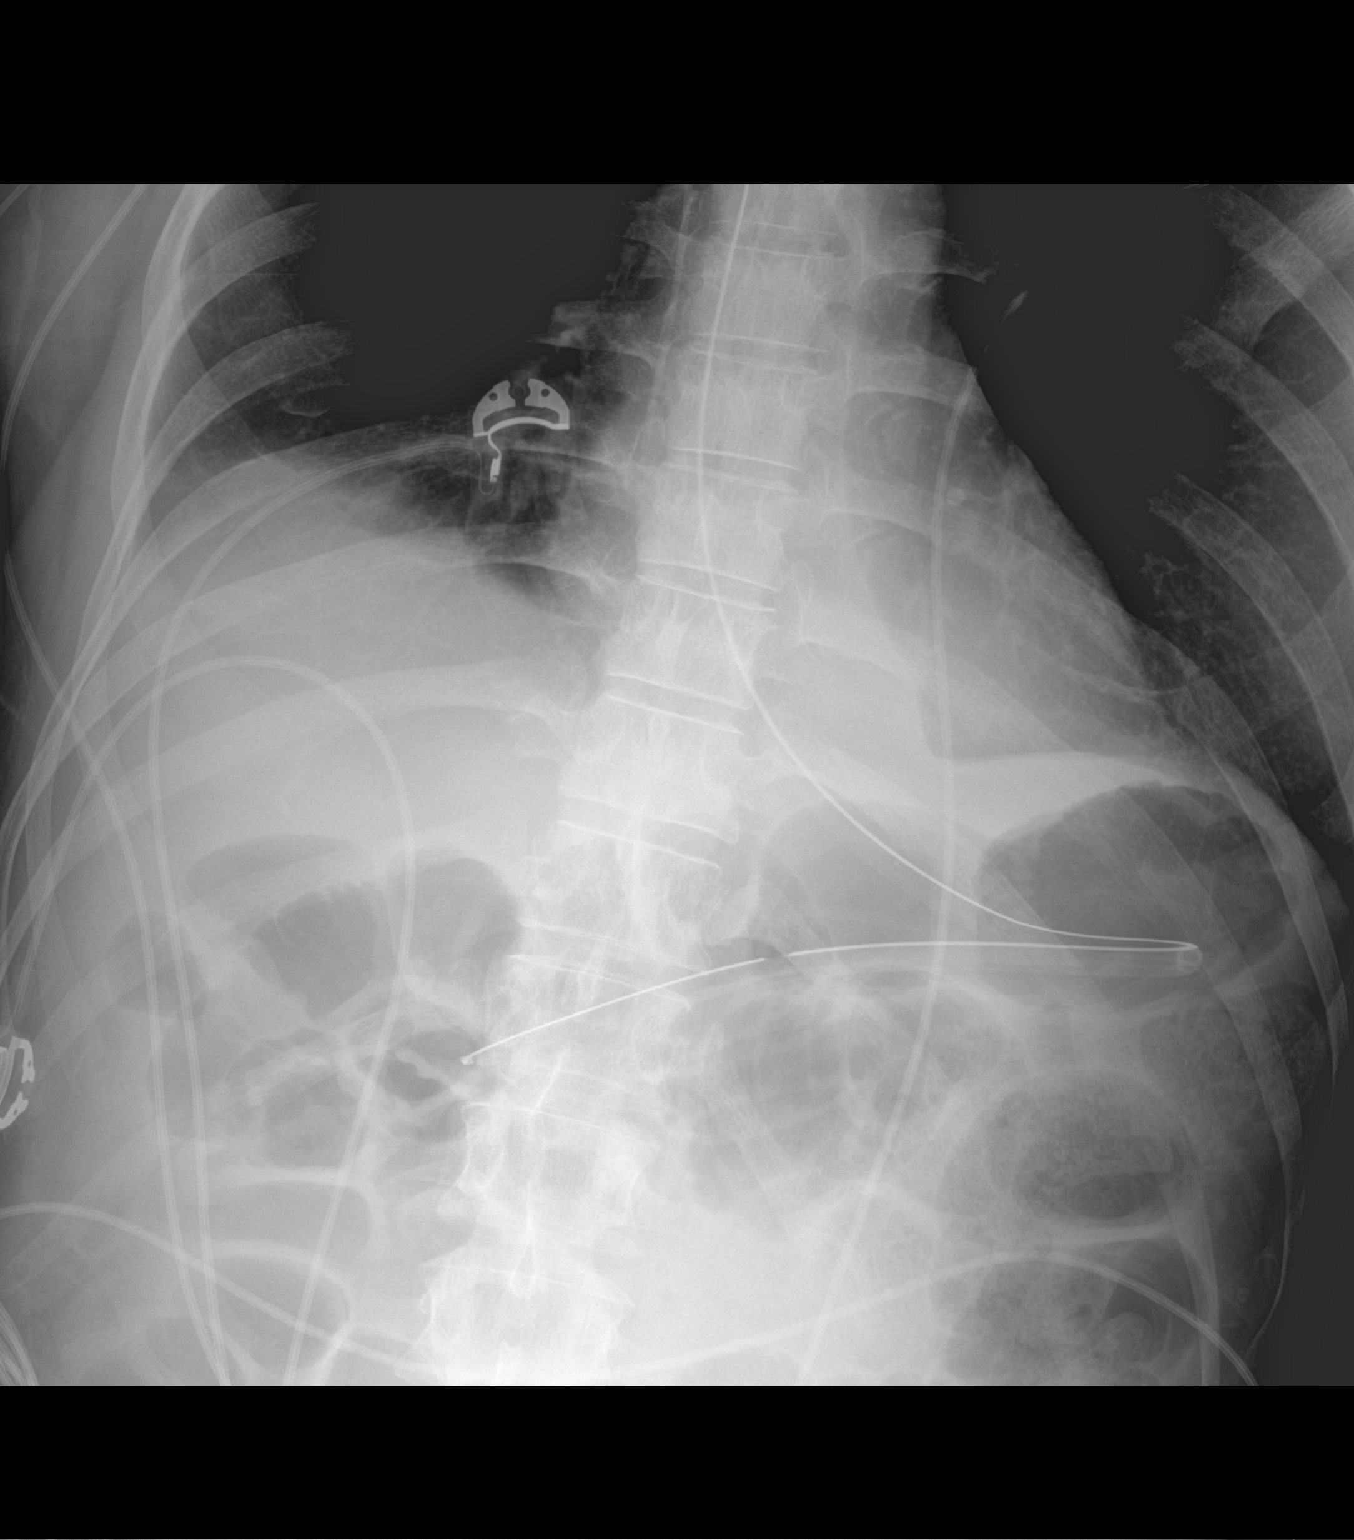

[1 of 1 positions shown; findings below may reference images not displayed]

FINDINGS: Multiple dilated small bowel loops concerning for ileus or
obstruction. NG tube with distal tip about the gastric pylorus. No
radio-opaque calculi or other significant radiographic abnormality
are seen.
IMPRESSION: Satisfactory positioning of the NG tube.

## 2024-01-02 IMAGING — DX DG ABD PORTABLE 1V
1 series · 1 of 1 positions shown · non-contrast
Comparison: 08/13/2021

CLINICAL DATA: Follow-up probable ileus. Status post laparotomy and
partial colectomy on 08/08/2021

EXAM:
PORTABLE ABDOMEN - 1 VIEW

[abdomen kub]
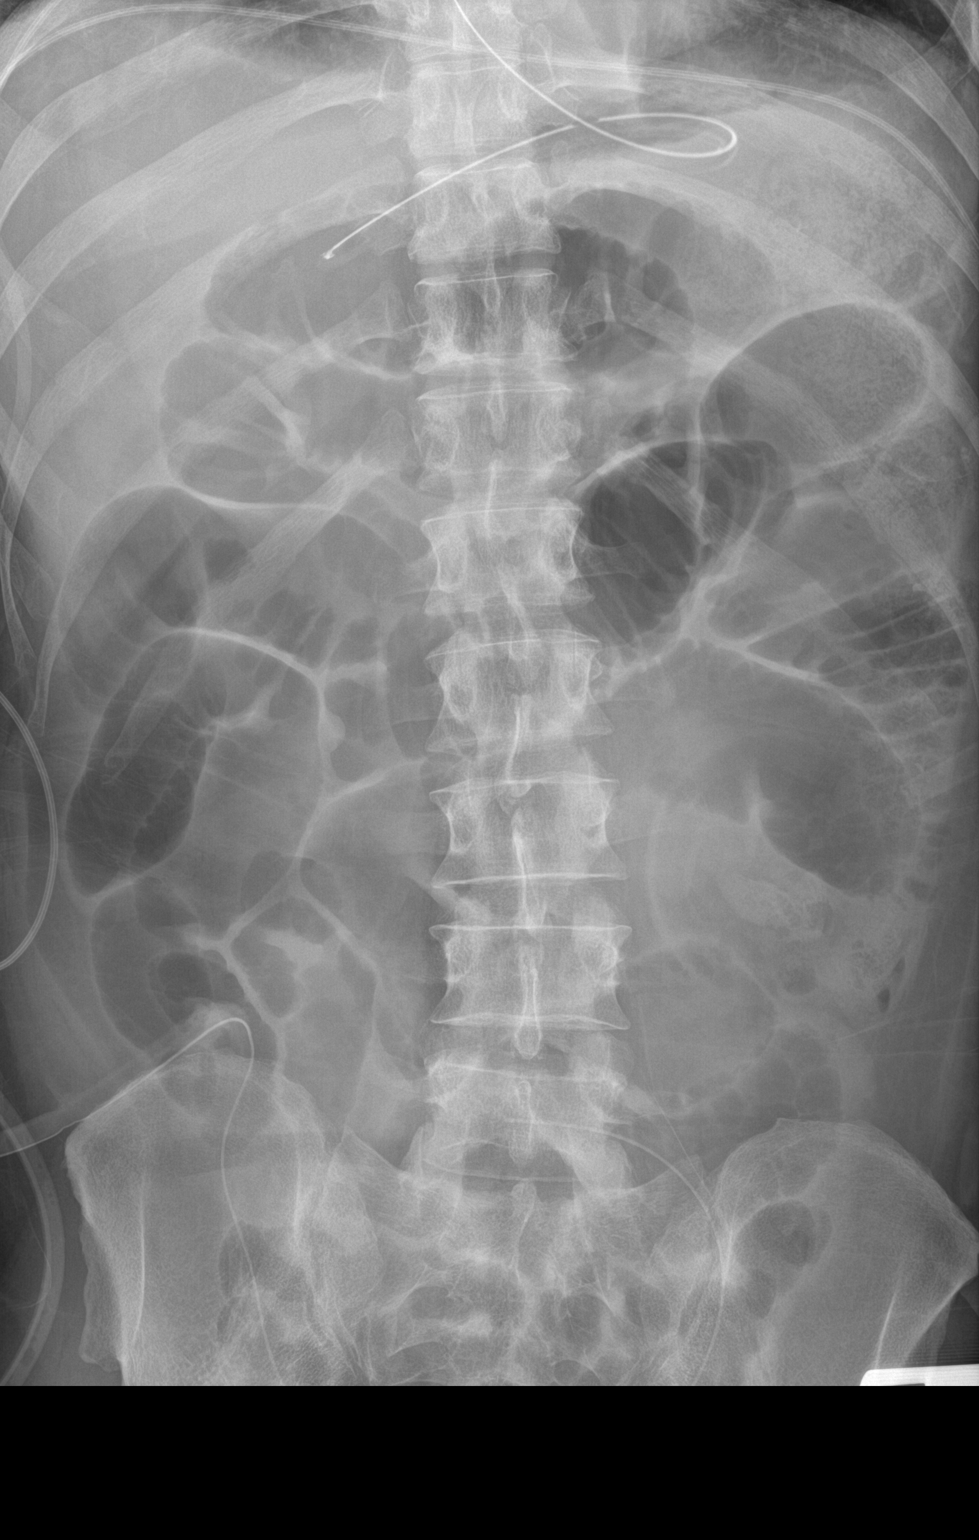

[1 of 1 positions shown; findings below may reference images not displayed]

FINDINGS: Nasogastric tube tip in the distal stomach in the region of the
pylorus. Increase in caliber of multiple dilated, gas-filled small
bowel loops. Normal caliber distal small bowel in the pelvis. Left
lower quadrant ostomy. Stable drainage tube overlying the pelvis and
inferior abdomen. Unremarkable bones.
IMPRESSION: Progressive small bowel dilatation, concerning for partial
obstruction.

## 2024-01-02 NOTE — Progress Notes (Unsigned)
 Patient Care Team: Medicine, [facility] as PCP - General Matthew Acton, MD as Consulting Physician (General Surgery) Matthew [place], MD as Consulting Physician (Oncology)  Clinic Day:  01/03/2024  Referring physician: [facility], [facility]  ASSESSMENT & PLAN:   Assessment & Plan: Cancer of left colon (HCC) stage IIIB p(T3, N1aM0), MSS -presented to ED on 08/06/21 with sigmoid colon perforation, s/p emergent partial colectomy on 08/08/21 by Dr. Jamse [name] showed invasive moderately differentiated adenocarcinoma. Margins negative, one lymph node showed metastatic carcinoma (1/12). -Staging chest CT 08/15/21 was negative for metastatic disease. -he completed 4 cycles adjuvant CAPOX on 10/10/21 - May or July 2023 -Surveillance scan 01/2022 showed pulmonary nodules and retroperitoneal adenopathy, concerning for recurrent/metastatic disease -Lung biopsy confirmed malignant cells consistent with colonic primary -He began first-line FOLFIRI in 05/2022, Vectibix  added with cycle 2.  Tolerated well with good response on CT from 07/31/2022 and again with continued response in lungs and lymph nodes on CT CAP from 10/26/2022.   -Changed to maintenance 5FU/leuc and vectibix  q2 weeks, on 10/28/22 - Restaging CT 10/06/2023 showed stable disease except increased size of the pulmonary nodule in the lingula, now measuring 7 mm -Matthew [name] appears stable, he continues 5-FU/leuc and Vectibix , tolerating well with no significant side effects.  He is able to recover and function well maintain adequate performance status.  There is no clinical evidence of disease progression -If labs meet parameters he will return for treatment plus mag on 5/19, will adjust future treatment cycles -restaging scan showing new and enlarging pulmonary nodules in bilateral lung fields. Change chemotherapy to FOLFIRI and vectibix . He has been on this regimen previously and tolerated well. Will supplement magnesium  with 2 g today and  additional 4 gm on 01/05/2024.    Cough Patient reporting productive cough. Phlegm is ranging from clear to brown. Cough is intermittent. He denies shortness of breath, wheezing, chest pain, or chest pressure. Reviewed CT CAP with the patient and his mother, present during today's visit. Likely that cough is coming from new, and/or, enlarging lymph nodes in the lungs. Plan is to change chemotherapy back to FOLFIRI along with Vectibix  every 2 weeks. He has prevoiusly been on this regimen and tolerated well.   Hypomagnesemia  Patient initially scheduled to get 4 gm magnesium  today and additional 2 g magnesium  on day of pump d/c on 01/05/2024. Due to addition of irinotecan  to chemotherapy today, will shorten today's magnesium  dose to 2 gm. Administer additional 4 gm on 01/05/2024.   Plan: Patient seen along with Dr. Maryalice [name] today.  Labs reviewed.  -CBC normal. -CMP essentially normal  -magnesium  low at 1.6 -CEA pending.  Reviewed CT CAP with patient and his mother. There are new and enlarging pulmonary nodules on both sides of the lungs. Stable disease in the abdomen and pelvis with no evidence of further progressive disease  -change chemotherapy to FOLFIRI and vectibix  today. Patient and his mother are in agreement with the updated treatment plan.  Labs/flush, follow up, and treatment as scheduled in 2 weeks .  The patient understands the plans discussed today and is in agreement with them.  He knows to contact our office if he develops concerns prior to his next appointment.  I provided 30 minutes of face-to-face time during this encounter and > 50% was spent counseling as documented under my assessment and plan.    Matthew Deis, NP  [facility] - [facility]  Matthew [name] [facility] [address] [place] [address] Dept: [phone] Dept Fax: [phone]   Orders Placed This Encounter  Procedures   CBC with Differential (Cancer  Center Only)    Standing Status:   Future    Expected Date:   01/03/2024    Expiration Date:   01/02/2025   CMP (Cancer Center only)    Standing Status:   Future    Expected Date:   01/03/2024    Expiration Date:   01/03/2025   Magnesium     Standing Status:   Future    Expected Date:   01/03/2024    Expiration Date:   01/02/2025   CBC with Differential (Cancer Center Only)    Standing Status:   Future    Expected Date:   01/17/2024    Expiration Date:   01/16/2025   CMP (Cancer Center only)    Standing Status:   Future    Expected Date:   01/17/2024    Expiration Date:   01/17/2025   Magnesium     Standing Status:   Future    Expected Date:   01/17/2024    Expiration Date:   01/16/2025   CBC with Differential (Cancer Center Only)    Standing Status:   Future    Expected Date:   01/31/2024    Expiration Date:   01/30/2025   CMP (Cancer Center only)    Standing Status:   Future    Expected Date:   01/31/2024    Expiration Date:   01/31/2025   Magnesium     Standing Status:   Future    Expected Date:   01/31/2024    Expiration Date:   01/30/2025   CBC with Differential (Cancer Center Only)    Standing Status:   Future    Expected Date:   02/14/2024    Expiration Date:   02/13/2025   CMP (Cancer Center only)    Standing Status:   Future    Expected Date:   02/14/2024    Expiration Date:   02/14/2025   Magnesium     Standing Status:   Future    Expected Date:   02/14/2024    Expiration Date:   02/13/2025      CHIEF COMPLAINT:  CC: Follow-up colon cancer  Current Treatment: Second line chemotherapy with 5-FU and Vectibix  - change to FOLFIRI and vectibix  01/03/2024.   INTERVAL HISTORY:  Matthew [name] is here today for repeat clinical assessment.  He was last seen by Lacie, NP on 12/16/2023.  He had restaging CT of the chest, abdomen, and pelvis on 12/30/2023.  There is increased size of bilateral pulmonary nodules with new 4 mm left upper lobe and 5 mm superior segment right lower lobe pulmonary nodules,  compatible with worsening pulmonary metastatic disease.  There are stable prominent bilateral axillary and mediastinal lymph nodes.  Stable peritoneal lymph nodes.  There is prior partial left hemicolectomy with Hartman's pouch and left anterior abdominal wall colostomy.  No new suspicious nodularity along the suture line.  Her reports mild and intermittent productive cough. Phlegm can be clear to tan. Denies wheezing or shortness of breath. He no longer smokes. States that he quit smoking a while ago. He denies chest pain, chest pressure, or shortness of breath. He denies headaches or visual disturbances. He denies abdominal pain, nausea, vomiting, or changes in bowel or bladder habits.   He denies fevers or chills. He denies pain. His appetite is good. His weight has been stable.  I have reviewed the past medical history, past surgical history, social history and family history with the patient and they are unchanged from previous note.  ALLERGIES:  has no known allergies.  MEDICATIONS:  Current Outpatient Medications  Medication Sig Dispense Refill   acetaminophen  (TYLENOL ) 500 MG tablet Take 2 tablets (1,000 mg total) by mouth every 6 (six) hours as needed for mild pain or moderate pain.  0   apixaban  (ELIQUIS ) 5 MG TABS tablet Take 1 tablet (5 mg total) by mouth 2 (two) times daily. 60 tablet 2   aspirin EC 81 MG tablet Take 81 mg by mouth daily as needed (for pain or headaches).      benztropine  (COGENTIN ) 1 MG tablet Take 1 mg at bedtime by mouth.     clindamycin  (CLINDAGEL) 1 % gel Apply topically 2 (two) times daily. To skin rash on face and upper body 30 g 0   ferrous sulfate  325 (65 FE) MG EC tablet Take 1 tablet (325 mg total) by mouth daily. 30 tablet 3   hydrocortisone  cream 1 % Apply 1 Application topically 2 (two) times daily as needed for itching. For rash 30 g 2   loperamide  (IMODIUM ) 2 MG capsule Take 1 capsule (2 mg total) by mouth as needed for diarrhea or loose stools. 30  capsule 0   magnesium  oxide (MAG-OX) 400 (240 Mg) MG tablet Take 1 tablet (400 mg total) by mouth daily. 30 tablet 2   Multiple Vitamin (MULTIVITAMIN WITH MINERALS) TABS tablet Take 1 tablet by mouth daily.     ondansetron  (ZOFRAN ) 8 MG tablet Take 1 tablet (8 mg total) by mouth every 8 (eight) hours as needed for nausea or vomiting. 30 tablet 0   paliperidone  (INVEGA  SUSTENNA) 156 MG/ML SUSP injection Inject 156 mg every 30 (thirty) days into the muscle.     polyethylene glycol (MIRALAX  / GLYCOLAX ) 17 g packet Take 17 g by mouth daily as needed for mild constipation or moderate constipation.  0   potassium chloride  SA (KLOR-CON  M) 20 MEQ tablet Take 1 tablet (20 mEq total) by mouth daily. 30 tablet 2   prochlorperazine  (COMPAZINE ) 10 MG tablet TAKE 1 TABLET(10 MG) BY MOUTH EVERY 6 HOURS AS NEEDED FOR NAUSEA OR VOMITING 30 tablet 0   No current facility-administered medications for this visit.   Facility-Administered Medications Ordered in Other Visits  Medication Dose Route Frequency Provider Last Rate Last Admin   0.9 %  sodium chloride  infusion   Intravenous Once Matthew [place], MD       atropine  injection 0.5 mg  0.5 mg Intravenous Once PRN Matthew [place], MD       dexamethasone  (DECADRON ) injection 10 mg  10 mg Intravenous Once Matthew [place], MD       fluorouracil  (ADRUCIL ) 4,350 mg in sodium chloride  0.9 % 63 mL chemo infusion  2,400 mg/m2 (Treatment Plan Recorded) Intravenous 1 day or 1 dose Matthew [place], MD       irinotecan  (CAMPTOSAR ) 320 mg in sodium chloride  0.9 % 500 mL chemo infusion  180 mg/m2 (Treatment Plan Recorded) Intravenous Once Matthew [place], MD       leucovorin  728 mg in sodium chloride  0.9 % 250 mL infusion  400 mg/m2 (Treatment Plan Recorded) Intravenous Once Matthew [place], MD       Cecily Cohen ON 01/04/2024] magnesium  sulfate IVPB 2 g 50 mL  2 g Intravenous Once Matthew [place], MD 50 mL/hr at 01/03/24 1215 2 g at 01/03/24 1215    HISTORY OF  PRESENT ILLNESS:   Oncology History Overview Note   Cancer  Staging  Cancer of left colon [facility]) Staging form: Colon and Rectum, AJCC 8th Edition - Pathologic stage from 08/08/2021: Stage IIIB (pT3, pN1a, cM0) - Signed by Matthew [place], MD on 08/26/2021    Cancer of left colon (HCC)  04/01/2020 Imaging   IMPRESSION: 1. Gallbladder decompressed bowel also partially calcified gallstones. No pericholecystic inflammation though if there is persisting clinical concern for cholecystitis right upper quadrant ultrasound could be obtained. 2. Circumferential thickening of the distal thoracic esophagus. Could reflect features of esophagitis. Correlate with clinical symptoms and consider endoscopy as clinically warranted. 3. Additional segmental thickening of the mid to distal sigmoid with focal narrowing. No acute surrounding inflammation or resulting obstruction. Findings are nonspecific, and could reflect sequela of prior inflammation/infection. However, recommend correlation with colonoscopy if not recently performed. 4. Mild circumferential bladder wall thickening and indentation of the bladder base by an enlarged prostate. Possibly sequela of chronic outlet obstruction though could correlate with urinalysis to exclude cystitis. 5. Aortic Atherosclerosis (ICD10-I70.0).   08/06/2021 Imaging   IMPRESSION: Sigmoid colonic perforation with small free intraperitoneal gas and infiltration of the mesenteric and omental fat in keeping with changes of peritonitis.   Long segment inflammatory stranding of the sigmoid colon in keeping with a severe infectious or inflammatory colitis. This terminates an area of irregular mural thickening, infiltrative soft tissue within the colonic mesentery, and focal dystrophic calcification. This may represent a chronic inflammatory process, however, a perforated malignancy could appear similarly. There are 2 separate points of perforation which again raise the question of an underlying malignancy.   7.1 cm gas and fluid containing  pericolonic abscess within the sigmoid mesentery.   Marked inflammatory change of the terminal ileum adjacent to the pericolonic abscess with resultant small bowel obstruction. Fluid within the distal esophagus likely relates to gastroesophageal reflux the setting of vomiting.   Aortic Atherosclerosis (ICD10-I70.0).   08/08/2021 Cancer Staging   Staging form: Colon and Rectum, AJCC 8th Edition - Pathologic stage from 08/08/2021: Stage IIIB (pT3, pN1a, cM0) - Signed by Matthew [place], MD on 08/26/2021 Stage prefix: Initial diagnosis Total positive nodes: 1 Histologic grading system: 4 grade system Histologic grade (G): G2 Residual tumor (R): R0 - None   08/08/2021 Definitive Surgery   FINAL MICROSCOPIC DIAGNOSIS:   A. COLON, SIGMOID, PARTIAL COLECTOMY:  - Invasive moderately differentiated adenocarcinoma.  - Metastatic carcinoma involving one of twelve lymph nodes (1/12).  - See oncology table below.   ADDENDUM:  Mismatch Repair Protein (IHC)  SUMMARY INTERPRETATION: NORMAL    08/14/2021 Imaging   EXAM: CT ABDOMEN AND PELVIS WITH CONTRAST  IMPRESSION: 1. Post recent sigmoid colectomy with left lower quadrant colostomy. Two small residual foci of air within the pelvic mesentery with mild adjacent thickening, but no abscess or drainable collection. Trace non organized free fluid and stranding in the pelvis. 2. Short segment of small bowel wall thickening and inflammation in the pelvis involving the distal ileum, likely reactive. 3. Dilated distal esophagus, stomach, and small bowel, without discrete transition point, favoring postoperative ileus. 4. Small bilateral pleural effusions and compressive atelectasis. 5. Heterogeneous partially enhancing 14 mm lymph node in the retroperitoneum at the aortoiliac bifurcation, not significantly changed from prior exam. Suspected additional lymph nodes in the anterior common iliac space, not significantly changed from prior exam. Recommend  attention at follow-up. 6. Additional chronic findings as described.     08/15/2021 Imaging   EXAM: CT CHEST WITH CONTRAST  IMPRESSION: 1. No evidence of thoracic metastasis. 2. Bilateral small layering pleural effusions with passive atelectasis   08/26/2021 Initial Diagnosis   Cancer of left colon (HCC)   10/10/2021 - 12/12/2021 Chemotherapy   Patient is on Treatment Plan : COLORECTAL Xelox (Capeox) q21d     05/28/2022 - 05/28/2022 Chemotherapy   Patient is on Treatment Plan : COLORECTAL FOLFIRI + Bevacizumab q14d     05/28/2022 -  Chemotherapy   Patient is on Treatment Plan : COLORECTAL FOLFIRI + Panitumumab  q14d     07/31/2022 Imaging    IMPRESSION: Decreased bilateral pulmonary metastases.   Stable mild abdominal lymphadenopathy.   No new or progressive metastatic disease within the chest, abdomen, or pelvis.   01/18/2023 Imaging    IMPRESSION: 1. Status post Esther Hem pouch sigmoid colon resection with left lower quadrant end colostomy and rectal stump. Unchanged, mild wall thickening of the decompressed distal colon at the ileostomy, as well as the rectal stump. 2. Unchanged small, treated metastatic pulmonary nodules. No new nodules. 3. Unchanged enlarged, coarsely calcified treated lower aortocaval metastatic lymph nodes. No new lymphadenopathy. 4. Coronary artery disease. 5. Cholelithiasis.   04/23/2023 Imaging   CT chest, abdomen, and pelvis with contrast  IMPRESSION: 1. Left lower lobe segmental pulmonary emboli, incidental finding. No evidence for right heart strain. 2. Stable trace bilateral pleural effusions. 3. Stable enlarged lower left paraesophageal lymph node. 4. Stable calcified retroperitoneal lymph nodes. No new enlarged lymph nodes in the chest, abdomen or pelvis. 5. Stable subcentimeter soft tissue nodules within the mesenteric fat along the inferior ostomy. 6. Left Bosniak I benign renal cyst measuring 4.6 cm. No follow-up imaging is  recommended. JACR 2018 Feb; 264-273, Management of the Incidental Renal Mass on CT, [employer] 2021; 814-848, Bosniak Classification of Cystic Renal Masses, Version 2019.   07/15/2023 Imaging   CT chest abdomen and pelvis with contrast IMPRESSION: 1. Prior partial left hemicolectomy with end colostomy in the left anterior abdominal wall. Unchanged mild wall thickening of the decompressed distal colon at the ileostomy as well as the rectal stump/Hartmann's pouch. 2. Stable enlarged left lower paraesophageal lymph node. 3. Increased size of the treated pulmonary nodule in the lingula now measuring 6 mm previously 4 mm with increased in size favored artifactual given motion degrading examination of the lung bases. Suggest attention on follow-up imaging 4. Unchanged size of the treated pulmonary nodule in the right middle lobe measuring 4 mm. 5. Increased size of prominent bilateral axillary lymph nodes measuring up to 7 mm in short axis, nonspecific. Suggest attention on follow-up imaging 6. No convincing evidence of new or progressive disease in the chest, abdomen or pelvis. 7.  Aortic Atherosclerosis (ICD10-I70.0).     10/06/2023 Imaging   CT chest abdomen pelvis with contrast IMPRESSION: 1. Increased size of the pulmonary nodule in the lingula now measuring 7 mm, concerning for pulmonary metastatic disease recurrence. 2. Additional bilateral pulmonary nodules are stable. 3. Stable prominent bilateral axillary, low paraesophageal and retroperitoneal lymph nodes. 4. Prior partial left hemicolectomy with Hartmann's pouch formation and left anterior abdominal wall colostomy. No suspicious nodularity along the Hartmann's suture line. 5. Patulous esophagus with symmetric distal esophageal wall thickening, similar prior. 6. Trace right pleural effusion. 7.  Aortic Atherosclerosis (ICD10-I70.0).     12/30/2023 Imaging   CT chest, abdomen, and pelvis with contrast IMPRESSION: 1. Increased size of  bilateral pulmonary nodules with new 4 mm left upper lobe and 5 mm superior segment right lower lobe pulmonary nodules, compatible with  worsening pulmonary metastatic disease. 2. Stable prominent bilateral axillary and mediastinal lymph nodes. 3. Stable coarsely calcified retroperitoneal lymph nodes measuring up to 10 mm in short axis. No new pathologically enlarged abdominal or pelvic lymph nodes. 4. Stable trace right pleural effusion. 5. Prior partial left hemicolectomy with Hartmann's pouch formation and left anterior abdominal wall colostomy. No new suspicious nodularity along the suture line.       REVIEW OF SYSTEMS:   Constitutional: Denies fevers, chills or abnormal weight loss Eyes: Denies blurriness of vision Ears, nose, mouth, throat, and face: Denies mucositis or sore throat Respiratory: Denies dyspnea or wheezing. Does have intermittent productive cough  Cardiovascular: Denies palpitation, chest discomfort or lower extremity swelling Gastrointestinal:  Denies nausea, heartburn or change in bowel habits Skin: Denies abnormal skin rashes Lymphatics: Denies new lymphadenopathy or easy bruising Neurological:Denies numbness, tingling or new weaknesses Behavioral/Psych: Mood is stable, no new changes  All other systems were reviewed with the patient and are negative.   VITALS:   Today's Vitals   01/03/24 1057 01/03/24 1105  BP: 110/78   Pulse: (!) 101   Resp: 17   Temp: (!) 97 F (36.1 C)   SpO2: 99%   Weight: 149 lb (67.6 kg)   PainSc:  0-No pain   Body mass index is 22 kg/m.    Wt Readings from Last 3 Encounters:  01/03/24 149 lb (67.6 kg)  12/20/23 150 lb 4 oz (68.2 kg)  12/16/23 149 lb (67.6 kg)    Body mass index is 22 kg/m.  Performance status (ECOG): 1 - Symptomatic but completely ambulatory  PHYSICAL EXAM:   GENERAL:alert, no distress and comfortable SKIN: skin color, texture, turgor are normal, no rashes or significant lesions EYES: normal,  Conjunctiva are pink and non-injected, sclera clear OROPHARYNX:no exudate, no erythema and lips, buccal mucosa, and tongue normal  NECK: supple, thyroid normal size, non-tender, without nodularity LYMPH:  no palpable lymphadenopathy in the cervical, axillary or inguinal LUNGS: clear to auscultation and percussion with normal breathing effort. Breath sounds are clear but diminished throughout the lung fields . HEART: regular rate & rhythm and no murmurs and no lower extremity edema ABDOMEN:abdomen soft, non-tender and normal bowel sounds. Intact colostomy.  Musculoskeletal:no cyanosis of digits and no clubbing  NEURO: alert & oriented x 3 with fluent speech, no focal motor/sensory deficits  LABORATORY DATA:  I have reviewed the data as listed    Component Value Date/Time   NA 138 01/03/2024 1043   K 3.5 01/03/2024 1043   CL 106 01/03/2024 1043   CO2 26 01/03/2024 1043   GLUCOSE 104 (H) 01/03/2024 1043   BUN 8 01/03/2024 1043   CREATININE 0.73 01/03/2024 1043   CALCIUM  9.4 01/03/2024 1043   PROT 6.9 01/03/2024 1043   ALBUMIN 4.2 01/03/2024 1043   AST 21 01/03/2024 1043   ALT 29 01/03/2024 1043   ALKPHOS 117 01/03/2024 1043   BILITOT 1.0 01/03/2024 1043   GFRNONAA >60 01/03/2024 1043   GFRAA >60 04/01/2020 1237    Lab Results  Component Value Date   WBC 4.4 01/03/2024   NEUTROABS 3.0 01/03/2024   HGB 13.9 01/03/2024   HCT 41.2 01/03/2024   MCV 89.2 01/03/2024   PLT 335 01/03/2024    RADIOGRAPHIC STUDIES: CT CHEST ABDOMEN PELVIS W CONTRAST Result Date: 12/30/2023 CLINICAL DATA:  Metastatic colon cancer, assess treatment response. * Tracking Code: BO * EXAM: CT CHEST, ABDOMEN, AND PELVIS WITH CONTRAST TECHNIQUE: Multidetector CT imaging of the chest, abdomen and  pelvis was performed following the standard protocol during bolus administration of intravenous contrast. RADIATION DOSE REDUCTION: This exam was performed according to the departmental dose-optimization program which  includes automated exposure control, adjustment of the mA and/or kV according to patient size and/or use of iterative reconstruction technique. CONTRAST:  OMNIPAQUE  IOHEXOL  300 MG/ML  SOLN COMPARISON:  Multiple priors including most recent CT October 06, 2023 FINDINGS: CT CHEST FINDINGS Cardiovascular: Right chest Port-A-Cath with tip in the right atrium. Normal caliber thoracic aorta. Normal size heart. Trace pericardial effusion. Mediastinum/Nodes: No suspicious thyroid nodule. Stable prominent bilateral axillary and mediastinal lymph nodes. For reference: Low [place] off a GIA lymph node measures 9 mm in short axis on image 43/2, unchanged. Hiatal hernia with patulous esophagus. Lungs/Pleura: Increased size bilateral pulmonary nodules. For reference: Solid nodule in the lingula now measuring 10 mm on image 94/4 previously 7 mm. -Right middle lobe pulmonary nodule measures 6 mm on image 98/4 previously 4 mm. New 4 mm left upper lobe pulmonary nodule on image 45/4. New 5 mm superior segment right lower lobe pulmonary nodule on image 57/4. Stable trace right pleural effusion. Musculoskeletal: Stable sclerotic focus in the left lateral fifth rib on image 84/4 and in the right lateral T3 vertebral body on image 42/4. Both are stable dating back to August 15, 2021 and favored benign. CT ABDOMEN PELVIS FINDINGS Evaluation is limited by respiratory motion. Hepatobiliary: No suspicious hepatic lesion. Gallbladder is unremarkable. No biliary ductal dilation. Pancreas: No pancreatic ductal dilation or evidence of acute inflammation. Spleen: No splenomegaly. Adrenals/Urinary Tract: No suspicious adrenal nodule/mass. No hydronephrosis. Left renal cyst. Urinary bladder is unremarkable for degree of distension. Stomach/Bowel: Stomach is unremarkable for degree of distension. No pathologic dilation of small or large bowel. Prior partial left hemicolectomy with Hartmann's pouch formation and left anterior abdominal wall  colostomy. No new suspicious nodularity along the suture line. Vascular/Lymphatic: Normal caliber abdominal aorta. Coarsely calcified retroperitoneal lymph nodes measuring up to 10 mm in short axis at the aortocaval station or unchanged. No new pathologically enlarged abdominal or pelvic lymph nodes. Reproductive: Prostate is unremarkable. Other: No significant abdominopelvic free fluid. Musculoskeletal: No aggressive lytic or blastic lesion of bone. IMPRESSION: 1. Increased size of bilateral pulmonary nodules with new 4 mm left upper lobe and 5 mm superior segment right lower lobe pulmonary nodules, compatible with worsening pulmonary metastatic disease. 2. Stable prominent bilateral axillary and mediastinal lymph nodes. 3. Stable coarsely calcified retroperitoneal lymph nodes measuring up to 10 mm in short axis. No new pathologically enlarged abdominal or pelvic lymph nodes. 4. Stable trace right pleural effusion. 5. Prior partial left hemicolectomy with Hartmann's pouch formation and left anterior abdominal wall colostomy. No new suspicious nodularity along the suture line. Electronically Signed   By: Tama Fails M.D.   On: 12/30/2023 14:23   Addendum I have seen the patient, examined him. I agree with the assessment and and plan and have edited the notes.   Mr. Aloia is clinically stable, with mild occasional cough.  I personally reviewed his restaging CT scan images with the patient and his mother, which showed disease progression in his lungs.  His disease burden is still low, I do not anticipate significant symptoms from this.  Recommend change his minute therapy back to FOLFIRI and Vectibix , which he previously tolerated well.  Patient and his mother agree, will start today and continue every 2 weeks.  We again discussed that the goal of therapy is palliative to prolong his life, and  preserve his quality of life.  All questions were answered.  Matthew [place]  01/03/2024

## 2024-01-02 NOTE — Assessment & Plan Note (Signed)
 stage IIIB p(T3, N1aM0), MSS -presented to ED on 08/06/21 with sigmoid colon perforation, s/p emergent partial colectomy on 08/08/21 by Dr. Jamse Mcgee showed invasive moderately differentiated adenocarcinoma. Margins negative, one lymph node showed metastatic carcinoma (1/12). -Staging chest CT 08/15/21 was negative for metastatic disease. -he completed 4 cycles adjuvant CAPOX on 10/10/21 - May or July 2023 -Surveillance scan 01/2022 showed pulmonary nodules and retroperitoneal adenopathy, concerning for recurrent/metastatic disease -Lung biopsy confirmed malignant cells consistent with colonic primary -He began first-line FOLFIRI in 05/2022, Vectibix  added with cycle 2.  Tolerated well with good response on CT from 07/31/2022 and again with continued response in lungs and lymph nodes on CT CAP from 10/26/2022.   -Changed to maintenance 5FU/leuc and vectibix  q2 weeks, on 10/28/22 - Restaging CT 10/06/2023 showed stable disease except increased size of the pulmonary nodule in the lingula, now measuring 7 mm -Matthew [name] appears stable, he continues 5-FU/leuc and Vectibix , tolerating well with no significant side effects.  He is able to recover and function well maintain adequate performance status.  There is no clinical evidence of disease progression -If labs meet parameters he will return for treatment plus mag on 5/19, will adjust future treatment cycles -restaging scan showing new and enlarging pulmonary nodules in bilateral lung fields. Change chemotherapy to FOLFIRI and vectibix . He has been on this regimen previously and tolerated well. Will supplement magnesium  with 2 g today and additional 4 gm on 01/05/2024.

## 2024-01-03 ENCOUNTER — Inpatient Hospital Stay: Payer: MEDICAID

## 2024-01-03 ENCOUNTER — Inpatient Hospital Stay: Payer: MEDICAID | Attending: Hematology

## 2024-01-03 ENCOUNTER — Inpatient Hospital Stay (HOSPITAL_BASED_OUTPATIENT_CLINIC_OR_DEPARTMENT_OTHER): Payer: MEDICAID | Attending: Hematology | Admitting: Nurse Practitioner

## 2024-01-03 VITALS — HR 100

## 2024-01-03 VITALS — BP 110/78 | HR 101 | Temp 97.0°F | Resp 17 | Wt 149.0 lb

## 2024-01-03 DIAGNOSIS — Z5111 Encounter for antineoplastic chemotherapy: Secondary | ICD-10-CM | POA: Diagnosis present

## 2024-01-03 DIAGNOSIS — C7802 Secondary malignant neoplasm of left lung: Secondary | ICD-10-CM | POA: Insufficient documentation

## 2024-01-03 DIAGNOSIS — Z79634 Long term (current) use of topoisomerase inhibitor: Secondary | ICD-10-CM | POA: Insufficient documentation

## 2024-01-03 DIAGNOSIS — Z5112 Encounter for antineoplastic immunotherapy: Secondary | ICD-10-CM | POA: Insufficient documentation

## 2024-01-03 DIAGNOSIS — Z79899 Other long term (current) drug therapy: Secondary | ICD-10-CM | POA: Insufficient documentation

## 2024-01-03 DIAGNOSIS — C186 Malignant neoplasm of descending colon: Secondary | ICD-10-CM

## 2024-01-03 DIAGNOSIS — C7801 Secondary malignant neoplasm of right lung: Secondary | ICD-10-CM | POA: Diagnosis not present

## 2024-01-03 DIAGNOSIS — Z79631 Long term (current) use of antimetabolite agent: Secondary | ICD-10-CM | POA: Insufficient documentation

## 2024-01-03 DIAGNOSIS — Z95828 Presence of other vascular implants and grafts: Secondary | ICD-10-CM

## 2024-01-03 DIAGNOSIS — C7800 Secondary malignant neoplasm of unspecified lung: Secondary | ICD-10-CM

## 2024-01-03 LAB — CMP (CANCER CENTER ONLY)
ALT: 29 U/L (ref 0–44)
AST: 21 U/L (ref 15–41)
Albumin: 4.2 g/dL (ref 3.5–5.0)
Alkaline Phosphatase: 117 U/L (ref 38–126)
Anion gap: 6 (ref 5–15)
BUN: 8 mg/dL (ref 6–20)
CO2: 26 mmol/L (ref 22–32)
Calcium: 9.4 mg/dL (ref 8.9–10.3)
Chloride: 106 mmol/L (ref 98–111)
Creatinine: 0.73 mg/dL (ref 0.61–1.24)
GFR, Estimated: >60 mL/min (ref >60)
Glucose, Bld: 104 mg/dL — ABNORMAL HIGH (ref 70–99)
Potassium: 3.5 mmol/L (ref 3.5–5.1)
Sodium: 138 mmol/L (ref 135–145)
Total Bilirubin: 1 mg/dL (ref 0.0–1.2)
Total Protein: 6.9 g/dL (ref 6.5–8.1)

## 2024-01-03 LAB — MAGNESIUM: Magnesium: 1.6 mg/dL — ABNORMAL LOW (ref 1.7–2.4)

## 2024-01-03 LAB — CBC WITH DIFFERENTIAL (CANCER CENTER ONLY)
Abs Immature Granulocytes: 0.01 10*3/uL (ref 0.00–0.07)
Basophils Absolute: 0 10*3/uL (ref 0.0–0.1)
Basophils Relative: 1 %
Eosinophils Absolute: 0.1 10*3/uL (ref 0.0–0.5)
Eosinophils Relative: 2 %
HCT: 41.2 % (ref 39.0–52.0)
Hemoglobin: 13.9 g/dL (ref 13.0–17.0)
Immature Granulocytes: 0 %
Lymphocytes Relative: 22 %
Lymphs Abs: 1 10*3/uL (ref 0.7–4.0)
MCH: 30.1 pg (ref 26.0–34.0)
MCHC: 33.7 g/dL (ref 30.0–36.0)
MCV: 89.2 fL (ref 80.0–100.0)
Monocytes Absolute: 0.3 10*3/uL (ref 0.1–1.0)
Monocytes Relative: 7 %
Neutro Abs: 3 10*3/uL (ref 1.7–7.7)
Neutrophils Relative %: 68 %
Platelet Count: 335 10*3/uL (ref 150–400)
RBC: 4.62 MIL/uL (ref 4.22–5.81)
RDW: 14.6 % (ref 11.5–15.5)
WBC Count: 4.4 10*3/uL (ref 4.0–10.5)
nRBC: 0 % (ref 0.0–0.2)

## 2024-01-03 LAB — CEA (ACCESS): CEA (CHCC): <1 ng/mL (ref 0.00–5.00)

## 2024-01-03 MED ORDER — MAGNESIUM SULFATE 2 GM/50ML IV SOLN
2.0000 g | Freq: Once | INTRAVENOUS | Status: AC
  Administered 2024-01-03: 2 g via INTRAVENOUS

## 2024-01-03 MED ORDER — SODIUM CHLORIDE 0.9 % IV SOLN: Freq: Once | INTRAVENOUS | Status: AC

## 2024-01-03 MED ORDER — DEXAMETHASONE SODIUM PHOSPHATE 10 MG/ML IJ SOLN
10.0000 mg | Freq: Once | INTRAMUSCULAR | Status: AC
  Administered 2024-01-03: 10 mg via INTRAVENOUS
  Filled 2024-01-03: qty 1

## 2024-01-03 MED ORDER — ATROPINE SULFATE 1 MG/ML IV SOLN
0.5000 mg | Freq: Once | INTRAVENOUS | Status: AC | PRN
  Administered 2024-01-03: 0.5 mg via INTRAVENOUS
  Filled 2024-01-03: qty 1

## 2024-01-03 MED ORDER — SODIUM CHLORIDE 0.9 % IV SOLN
6.0000 mg/kg | Freq: Once | INTRAVENOUS | Status: AC
  Administered 2024-01-03: 400 mg via INTRAVENOUS
  Filled 2024-01-03: qty 20

## 2024-01-03 MED ORDER — SODIUM CHLORIDE 0.9 % IV SOLN
180.0000 mg/m2 | Freq: Once | INTRAVENOUS | Status: AC
  Administered 2024-01-03: 320 mg via INTRAVENOUS
  Filled 2024-01-03: qty 15

## 2024-01-03 MED ORDER — SODIUM CHLORIDE 0.9 % IV SOLN
2400.0000 mg/m2 | INTRAVENOUS | Status: DC
  Administered 2024-01-03: 4350 mg via INTRAVENOUS
  Filled 2024-01-03: qty 87

## 2024-01-03 MED ORDER — SODIUM CHLORIDE 0.9 % IV SOLN
400.0000 mg/m2 | Freq: Once | INTRAVENOUS | Status: AC
  Administered 2024-01-03: 728 mg via INTRAVENOUS
  Filled 2024-01-03: qty 25

## 2024-01-03 MED ORDER — PALONOSETRON HCL INJECTION 0.25 MG/5ML
0.2500 mg | Freq: Once | INTRAVENOUS | Status: AC
  Administered 2024-01-03: 0.25 mg via INTRAVENOUS
  Filled 2024-01-03: qty 5

## 2024-01-03 NOTE — Patient Instructions (Signed)
 [facility] - A DEPT OF [facility]  Discharge Instructions: Thank you for choosing [facility] to provide your oncology and hematology care.   If you have a lab appointment with the Cancer Center, please go directly to the Cancer Center and check in at the registration area.   Wear comfortable clothing and clothing appropriate for easy access to any Portacath or PICC line.   We strive to give you quality time with your provider. You may need to reschedule your appointment if you arrive late (15 or more minutes).  Arriving late affects you and other patients whose appointments are after yours.  Also, if you miss three or more appointments without notifying the office, you may be dismissed from the clinic at the provider's discretion.      For prescription refill requests, have your pharmacy contact our office and allow 72 hours for refills to be completed.    Today you received the following chemotherapy and/or immunotherapy agents: Vectibix /Irinotecan /Leucovorin /Fluorouracil        To help prevent nausea and vomiting after your treatment, we encourage you to take your nausea medication as directed.  BELOW ARE SYMPTOMS THAT SHOULD BE REPORTED IMMEDIATELY: *FEVER GREATER THAN 100.4 F (38 C) OR HIGHER *CHILLS OR SWEATING *NAUSEA AND VOMITING THAT IS NOT CONTROLLED WITH YOUR NAUSEA MEDICATION *UNUSUAL SHORTNESS OF BREATH *UNUSUAL BRUISING OR BLEEDING *URINARY PROBLEMS (pain or burning when urinating, or frequent urination) *BOWEL PROBLEMS (unusual diarrhea, constipation, pain near the anus) TENDERNESS IN MOUTH AND THROAT WITH OR WITHOUT PRESENCE OF ULCERS (sore throat, sores in mouth, or a toothache) UNUSUAL RASH, SWELLING OR PAIN  UNUSUAL VAGINAL DISCHARGE OR ITCHING   Items with * indicate a potential emergency and should be followed up as soon as possible or go to the Emergency Department if any problems should occur.  Please show the  CHEMOTHERAPY ALERT CARD or IMMUNOTHERAPY ALERT CARD at check-in to the Emergency Department and triage nurse.  Should you have questions after your visit or need to cancel or reschedule your appointment, please contact [facility] - A DEPT OF [facility]  Dept: [phone]  and follow the prompts.  Office hours are 8:00 a.m. to 4:30 p.m. Monday - Friday. Please note that voicemails left after 4:00 p.m. may not be returned until the following business day.  We are closed weekends and major holidays. You have access to a nurse at all times for urgent questions. Please call the main number to the clinic Dept: [phone] and follow the prompts.   For any non-urgent questions, you may also contact your provider using MyChart. We now offer e-Visits for anyone 50 and older to request care online for non-urgent symptoms. For details visit [email or web address].   Also download the MyChart app! Go to the app store, search "MyChart", open the app, select [place], and log in with your MyChart username and password.  The chemotherapy medication bag should finish at 46 hours, 96 hours, or 7 days. For example, if your pump is scheduled for 46 hours and it was put on at 4:00 p.m., it should finish at 2:00 p.m. the day it is scheduled to come off regardless of your appointment time.     Estimated time to finish at: 2:00 PM on 12/04/23   If the display on your pump reads "Low Volume" and it is beeping, take the batteries out of the pump and come to the cancer  center for it to be taken off.   If the pump alarms go off prior to the pump reading "Low Volume" then call [phone] and someone can assist you.  If the plunger comes out and the chemotherapy medication is leaking out, please use your home chemo spill kit to clean up the spill. Do NOT use paper towels or other household products.  If you have problems or questions regarding your pump, please call either  [phone] (24 hours a day) or the cancer center Monday-Friday 8:00 a.m.- 4:30 p.m. at the clinic number and we will assist you. If you are unable to get assistance, then go to the nearest Emergency Department and ask the staff to contact the IV team for assistance.

## 2024-01-03 NOTE — Patient Instructions (Signed)

## 2024-01-05 ENCOUNTER — Other Ambulatory Visit: Payer: Self-pay

## 2024-01-05 ENCOUNTER — Inpatient Hospital Stay: Payer: MEDICAID

## 2024-01-05 VITALS — BP 99/73 | HR 96 | Temp 97.4°F | Resp 18 | Ht 69.0 in | Wt 151.5 lb

## 2024-01-05 DIAGNOSIS — C186 Malignant neoplasm of descending colon: Secondary | ICD-10-CM

## 2024-01-05 DIAGNOSIS — Z5111 Encounter for antineoplastic chemotherapy: Secondary | ICD-10-CM | POA: Diagnosis not present

## 2024-01-05 DIAGNOSIS — C189 Malignant neoplasm of colon, unspecified: Secondary | ICD-10-CM

## 2024-01-05 DIAGNOSIS — R918 Other nonspecific abnormal finding of lung field: Secondary | ICD-10-CM

## 2024-01-05 DIAGNOSIS — Z95828 Presence of other vascular implants and grafts: Secondary | ICD-10-CM

## 2024-01-05 LAB — MAGNESIUM: Magnesium: 1.6 mg/dL — ABNORMAL LOW (ref 1.7–2.4)

## 2024-01-05 MED ORDER — MAGNESIUM SULFATE 2 GM/50ML IV SOLN
2.0000 g | Freq: Once | INTRAVENOUS | Status: AC
  Administered 2024-01-05: 2 g via INTRAVENOUS
  Filled 2024-01-05: qty 50

## 2024-01-05 MED ORDER — HEPARIN SOD (PORK) LOCK FLUSH 100 UNIT/ML IV SOLN
500.0000 [IU] | Freq: Once | INTRAVENOUS | Status: AC | PRN
  Administered 2024-01-05: 500 [IU]

## 2024-01-05 MED ORDER — SODIUM CHLORIDE 0.9% FLUSH
10.0000 mL | INTRAVENOUS | Status: DC | PRN
  Administered 2024-01-05: 10 mL

## 2024-01-05 MED ORDER — MAGNESIUM SULFATE 4 GM/100ML IV SOLN
4.0000 g | Freq: Once | INTRAVENOUS | Status: DC
  Filled 2024-01-05: qty 100

## 2024-01-05 MED ORDER — SODIUM CHLORIDE 0.9 % IV SOLN: INTRAVENOUS | Status: DC

## 2024-01-05 NOTE — Patient Instructions (Signed)
 Magnesium Sulfate Injection What is this medication? MAGNESIUM SULFATE (mag NEE zee um SUL fate) prevents and treats low levels of magnesium in your body. It may also be used to prevent and treat seizures during pregnancy in people with high blood pressure disorders, such as preeclampsia or eclampsia. Magnesium plays an important role in maintaining the health of your muscles and nervous system. This medicine may be used for other purposes; ask your health care provider or pharmacist if you have questions. What should I tell my care team before I take this medication? They need to know if you have any of these conditions: Heart disease History of irregular heart beat Kidney disease An unusual or allergic reaction to magnesium sulfate, medications, foods, dyes, or preservatives Pregnant or trying to get pregnant Breast-feeding How should I use this medication? This medication is for infusion into a vein. It is given in a hospital or clinic setting. Talk to your care team about the use of this medication in children. While this medication may be prescribed for selected conditions, precautions do apply. Overdosage: If you think you have taken too much of this medicine contact a poison control center or emergency room at once. NOTE: This medicine is only for you. Do not share this medicine with others. What if I miss a dose? This does not apply. What may interact with this medication? Certain medications for anxiety or sleep Certain medications for seizures, such phenobarbital Digoxin Medications that relax muscles for surgery Narcotic medications for pain This list may not describe all possible interactions. Give your health care provider a list of all the medicines, herbs, non-prescription drugs, or dietary supplements you use. Also tell them if you smoke, drink alcohol, or use illegal drugs. Some items may interact with your medicine. What should I watch for while using this  medication? Your condition will be monitored carefully while you are receiving this medication. You may need blood work done while you are receiving this medication. What side effects may I notice from receiving this medication? Side effects that you should report to your care team as soon as possible: Allergic reactions--skin rash, itching, hives, swelling of the face, lips, tongue, or throat High magnesium level--confusion, drowsiness, facial flushing, redness, sweating, muscle weakness, fast or irregular heartbeat, trouble breathing Low blood pressure--dizziness, feeling faint or lightheaded, blurry vision Side effects that usually do not require medical attention (report to your care team if they continue or are bothersome): Headache Nausea This list may not describe all possible side effects. Call your doctor for medical advice about side effects. You may report side effects to FDA at 1-800-FDA-1088. Where should I keep my medication? This medication is given in a hospital or clinic and will not be stored at home. NOTE: This sheet is a summary. It may not cover all possible information. If you have questions about this medicine, talk to your doctor, pharmacist, or health care provider.  2024 Elsevier/Gold Standard (2021-04-02 00:00:00)

## 2024-01-13 ENCOUNTER — Ambulatory Visit: Payer: MEDICAID | Admitting: Nurse Practitioner

## 2024-01-13 ENCOUNTER — Ambulatory Visit: Payer: MEDICAID

## 2024-01-13 ENCOUNTER — Other Ambulatory Visit: Payer: MEDICAID

## 2024-01-15 ENCOUNTER — Ambulatory Visit: Payer: MEDICAID

## 2024-01-17 ENCOUNTER — Encounter: Payer: Self-pay | Admitting: Nurse Practitioner

## 2024-01-17 ENCOUNTER — Inpatient Hospital Stay (HOSPITAL_BASED_OUTPATIENT_CLINIC_OR_DEPARTMENT_OTHER): Payer: MEDICAID | Admitting: Nurse Practitioner

## 2024-01-17 ENCOUNTER — Encounter: Payer: Self-pay | Admitting: Hematology

## 2024-01-17 ENCOUNTER — Inpatient Hospital Stay: Payer: MEDICAID

## 2024-01-17 VITALS — BP 112/80 | HR 98 | Temp 98.0°F | Resp 16 | Ht 69.0 in | Wt 150.2 lb

## 2024-01-17 DIAGNOSIS — D500 Iron deficiency anemia secondary to blood loss (chronic): Secondary | ICD-10-CM

## 2024-01-17 DIAGNOSIS — C7800 Secondary malignant neoplasm of unspecified lung: Secondary | ICD-10-CM | POA: Diagnosis not present

## 2024-01-17 DIAGNOSIS — C189 Malignant neoplasm of colon, unspecified: Secondary | ICD-10-CM

## 2024-01-17 DIAGNOSIS — C186 Malignant neoplasm of descending colon: Secondary | ICD-10-CM

## 2024-01-17 DIAGNOSIS — Z5111 Encounter for antineoplastic chemotherapy: Secondary | ICD-10-CM | POA: Diagnosis not present

## 2024-01-17 DIAGNOSIS — Z95828 Presence of other vascular implants and grafts: Secondary | ICD-10-CM

## 2024-01-17 LAB — CBC WITH DIFFERENTIAL (CANCER CENTER ONLY)
Abs Immature Granulocytes: 0.01 10*3/uL (ref 0.00–0.07)
Basophils Absolute: 0 10*3/uL (ref 0.0–0.1)
Basophils Relative: 1 %
Eosinophils Absolute: 0.2 10*3/uL (ref 0.0–0.5)
Eosinophils Relative: 5 %
HCT: 37.6 % — ABNORMAL LOW (ref 39.0–52.0)
Hemoglobin: 12.7 g/dL — ABNORMAL LOW (ref 13.0–17.0)
Immature Granulocytes: 0 %
Lymphocytes Relative: 28 %
Lymphs Abs: 1 10*3/uL (ref 0.7–4.0)
MCH: 29.8 pg (ref 26.0–34.0)
MCHC: 33.8 g/dL (ref 30.0–36.0)
MCV: 88.3 fL (ref 80.0–100.0)
Monocytes Absolute: 0.3 10*3/uL (ref 0.1–1.0)
Monocytes Relative: 7 %
Neutro Abs: 2.1 10*3/uL (ref 1.7–7.7)
Neutrophils Relative %: 59 %
Platelet Count: 302 10*3/uL (ref 150–400)
RBC: 4.26 MIL/uL (ref 4.22–5.81)
RDW: 14 % (ref 11.5–15.5)
WBC Count: 3.6 10*3/uL — ABNORMAL LOW (ref 4.0–10.5)
nRBC: 0 % (ref 0.0–0.2)

## 2024-01-17 LAB — CMP (CANCER CENTER ONLY)
ALT: 14 U/L (ref 0–44)
AST: 15 U/L (ref 15–41)
Albumin: 4.1 g/dL (ref 3.5–5.0)
Alkaline Phosphatase: 103 U/L (ref 38–126)
Anion gap: 8 (ref 5–15)
BUN: 10 mg/dL (ref 6–20)
CO2: 25 mmol/L (ref 22–32)
Calcium: 9.2 mg/dL (ref 8.9–10.3)
Chloride: 106 mmol/L (ref 98–111)
Creatinine: 0.7 mg/dL (ref 0.61–1.24)
GFR, Estimated: >60 mL/min (ref >60)
Glucose, Bld: 96 mg/dL (ref 70–99)
Potassium: 3.6 mmol/L (ref 3.5–5.1)
Sodium: 139 mmol/L (ref 135–145)
Total Bilirubin: 0.9 mg/dL (ref 0.0–1.2)
Total Protein: 6.9 g/dL (ref 6.5–8.1)

## 2024-01-17 LAB — FERRITIN: Ferritin: 60 ng/mL (ref 24–336)

## 2024-01-17 LAB — MAGNESIUM: Magnesium: 1.8 mg/dL (ref 1.7–2.4)

## 2024-01-17 MED ORDER — SODIUM CHLORIDE 0.9% FLUSH: 10.0000 mL | INTRAVENOUS | Status: DC | PRN

## 2024-01-17 MED ORDER — HEPARIN SOD (PORK) LOCK FLUSH 100 UNIT/ML IV SOLN
500.0000 [IU] | Freq: Once | INTRAVENOUS | Status: DC | PRN
Start: 2024-01-17 — End: 2024-01-17

## 2024-01-17 MED ORDER — HEPARIN SOD (PORK) LOCK FLUSH 100 UNIT/ML IV SOLN: 500.0000 [IU] | Freq: Once | INTRAVENOUS | Status: DC

## 2024-01-17 MED ORDER — SODIUM CHLORIDE 0.9% FLUSH: 10.0000 mL | Freq: Once | INTRAVENOUS | Status: DC

## 2024-01-17 MED ORDER — SODIUM CHLORIDE 0.9 % IV SOLN
2400.0000 mg/m2 | INTRAVENOUS | Status: DC
  Administered 2024-01-17: 4350 mg via INTRAVENOUS
  Filled 2024-01-17: qty 87

## 2024-01-17 MED ORDER — PALONOSETRON HCL INJECTION 0.25 MG/5ML
0.2500 mg | Freq: Once | INTRAVENOUS | Status: AC
  Administered 2024-01-17: 0.25 mg via INTRAVENOUS
  Filled 2024-01-17: qty 5

## 2024-01-17 MED ORDER — SODIUM CHLORIDE 0.9 % IV SOLN
180.0000 mg/m2 | Freq: Once | INTRAVENOUS | Status: AC
  Administered 2024-01-17: 320 mg via INTRAVENOUS
  Filled 2024-01-17: qty 15

## 2024-01-17 MED ORDER — DEXAMETHASONE SODIUM PHOSPHATE 10 MG/ML IJ SOLN
10.0000 mg | Freq: Once | INTRAMUSCULAR | Status: AC
  Administered 2024-01-17: 10 mg via INTRAVENOUS
  Filled 2024-01-17: qty 1

## 2024-01-17 MED ORDER — ATROPINE SULFATE 1 MG/ML IV SOLN
0.5000 mg | Freq: Once | INTRAVENOUS | Status: AC | PRN
  Administered 2024-01-17: 0.5 mg via INTRAVENOUS
  Filled 2024-01-17: qty 1

## 2024-01-17 MED ORDER — SODIUM CHLORIDE 0.9 % IV SOLN: Freq: Once | INTRAVENOUS | Status: AC

## 2024-01-17 MED ORDER — SODIUM CHLORIDE 0.9 % IV SOLN
6.0000 mg/kg | Freq: Once | INTRAVENOUS | Status: AC
  Administered 2024-01-17: 400 mg via INTRAVENOUS
  Filled 2024-01-17: qty 20

## 2024-01-17 MED ORDER — SODIUM CHLORIDE 0.9 % IV SOLN
400.0000 mg/m2 | Freq: Once | INTRAVENOUS | Status: AC
  Administered 2024-01-17: 728 mg via INTRAVENOUS
  Filled 2024-01-17: qty 25

## 2024-01-17 MED ORDER — MAGNESIUM SULFATE 2 GM/50ML IV SOLN
2.0000 g | Freq: Once | INTRAVENOUS | Status: AC
  Administered 2024-01-17: 2 g via INTRAVENOUS
  Filled 2024-01-17: qty 50

## 2024-01-17 NOTE — Addendum Note (Signed)
 Addended by: Krithi Bray K on: 01/17/2024 12:32 PM   Modules accepted: Orders

## 2024-01-17 NOTE — Progress Notes (Signed)
 [facility]   Telephone:([phone] [phone]    Patient Care Team: Medicine, [facility] as PCP - General Adalberto Acton, MD as Consulting Physician (General Surgery) Sonja Strandquist, MD as Consulting Physician (Oncology)   CHIEF COMPLAINT: Follow up colon cancer  Oncology History Overview Note   Cancer Staging  Cancer of left colon [facility]) Staging form: Colon and Rectum, AJCC 8th Edition - Pathologic stage from 08/08/2021: Stage IIIB (pT3, pN1a, cM0) - Signed by Sonja [place], MD on 08/26/2021    Cancer of left colon (HCC)  04/01/2020 Imaging   IMPRESSION: 1. Gallbladder decompressed bowel also partially calcified gallstones. No pericholecystic inflammation though if there is persisting clinical concern for cholecystitis right upper quadrant ultrasound could be obtained. 2. Circumferential thickening of the distal thoracic esophagus. Could reflect features of esophagitis. Correlate with clinical symptoms and consider endoscopy as clinically warranted. 3. Additional segmental thickening of the mid to distal sigmoid with focal narrowing. No acute surrounding inflammation or resulting obstruction. Findings are nonspecific, and could reflect sequela of prior inflammation/infection. However, recommend correlation with colonoscopy if not recently performed. 4. Mild circumferential bladder wall thickening and indentation of the bladder base by an enlarged prostate. Possibly sequela of chronic outlet obstruction though could correlate with urinalysis to exclude cystitis. 5. Aortic Atherosclerosis (ICD10-I70.0).   08/06/2021 Imaging   IMPRESSION: Sigmoid colonic perforation with small free intraperitoneal gas and infiltration of the mesenteric and omental fat in keeping with changes of peritonitis.   Long segment inflammatory stranding of the sigmoid colon in keeping with a severe infectious or inflammatory colitis. This terminates an area of  irregular mural thickening, infiltrative soft tissue within the colonic mesentery, and focal dystrophic calcification. This may represent a chronic inflammatory process, however, a perforated malignancy could appear similarly. There are 2 separate points of perforation which again raise the question of an underlying malignancy.   7.1 cm gas and fluid containing pericolonic abscess within the sigmoid mesentery.   Marked inflammatory change of the terminal ileum adjacent to the pericolonic abscess with resultant small bowel obstruction. Fluid within the distal esophagus likely relates to gastroesophageal reflux the setting of vomiting.   Aortic Atherosclerosis (ICD10-I70.0).   08/08/2021 Cancer Staging   Staging form: Colon and Rectum, AJCC 8th Edition - Pathologic stage from 08/08/2021: Stage IIIB (pT3, pN1a, cM0) - Signed by Sonja , MD on 08/26/2021 Stage prefix: Initial diagnosis Total positive nodes: 1 Histologic grading system: 4 grade system Histologic grade (G): G2 Residual tumor (R): R0 - None   08/08/2021 Definitive Surgery   FINAL MICROSCOPIC DIAGNOSIS:   A. COLON, SIGMOID, PARTIAL COLECTOMY:  - Invasive moderately differentiated adenocarcinoma.  - Metastatic carcinoma involving one of twelve lymph nodes (1/12).  - See oncology table below.   ADDENDUM:  Mismatch Repair Protein (IHC)  SUMMARY INTERPRETATION: NORMAL    08/14/2021 Imaging   EXAM: CT ABDOMEN AND PELVIS WITH CONTRAST  IMPRESSION: 1. Post recent sigmoid colectomy with left lower quadrant colostomy. Two small residual foci of air within the pelvic mesentery with mild adjacent thickening, but no abscess or drainable collection. Trace non organized free fluid and stranding in the pelvis. 2. Short segment of small bowel wall thickening and inflammation in the pelvis involving the distal ileum, likely reactive. 3. Dilated distal esophagus, stomach, and small bowel, without discrete transition point, favoring  postoperative ileus. 4. Small bilateral pleural effusions and compressive atelectasis. 5. Heterogeneous partially enhancing 14 mm lymph node in the retroperitoneum  at the aortoiliac bifurcation, not significantly changed from prior exam. Suspected additional lymph nodes in the anterior common iliac space, not significantly changed from prior exam. Recommend attention at follow-up. 6. Additional chronic findings as described.     08/15/2021 Imaging   EXAM: CT CHEST WITH CONTRAST  IMPRESSION: 1. No evidence of thoracic metastasis. 2. Bilateral small layering pleural effusions with passive atelectasis   08/26/2021 Initial Diagnosis   Cancer of left colon (HCC)   10/10/2021 - 12/12/2021 Chemotherapy   Patient is on Treatment Plan : COLORECTAL Xelox (Capeox) q21d     05/28/2022 - 05/28/2022 Chemotherapy   Patient is on Treatment Plan : COLORECTAL FOLFIRI + Bevacizumab q14d     05/28/2022 -  Chemotherapy   Patient is on Treatment Plan : COLORECTAL FOLFIRI + Panitumumab  q14d     07/31/2022 Imaging    IMPRESSION: Decreased bilateral pulmonary metastases.   Stable mild abdominal lymphadenopathy.   No new or progressive metastatic disease within the chest, abdomen, or pelvis.   01/18/2023 Imaging    IMPRESSION: 1. Status post Esther Hem pouch sigmoid colon resection with left lower quadrant end colostomy and rectal stump. Unchanged, mild wall thickening of the decompressed distal colon at the ileostomy, as well as the rectal stump. 2. Unchanged small, treated metastatic pulmonary nodules. No new nodules. 3. Unchanged enlarged, coarsely calcified treated lower aortocaval metastatic lymph nodes. No new lymphadenopathy. 4. Coronary artery disease. 5. Cholelithiasis.   04/23/2023 Imaging   CT chest, abdomen, and pelvis with contrast  IMPRESSION: 1. Left lower lobe segmental pulmonary emboli, incidental finding. No evidence for right heart strain. 2. Stable trace bilateral pleural  effusions. 3. Stable enlarged lower left paraesophageal lymph node. 4. Stable calcified retroperitoneal lymph nodes. No new enlarged lymph nodes in the chest, abdomen or pelvis. 5. Stable subcentimeter soft tissue nodules within the mesenteric fat along the inferior ostomy. 6. Left Bosniak I benign renal cyst measuring 4.6 cm. No follow-up imaging is recommended. JACR 2018 Feb; 264-273, Management of the Incidental Renal Mass on CT, [employer] 2021; 814-848, Bosniak Classification of Cystic Renal Masses, Version 2019.   07/15/2023 Imaging   CT chest abdomen and pelvis with contrast IMPRESSION: 1. Prior partial left hemicolectomy with end colostomy in the left anterior abdominal wall. Unchanged mild wall thickening of the decompressed distal colon at the ileostomy as well as the rectal stump/Hartmann's pouch. 2. Stable enlarged left lower paraesophageal lymph node. 3. Increased size of the treated pulmonary nodule in the lingula now measuring 6 mm previously 4 mm with increased in size favored artifactual given motion degrading examination of the lung bases. Suggest attention on follow-up imaging 4. Unchanged size of the treated pulmonary nodule in the right middle lobe measuring 4 mm. 5. Increased size of prominent bilateral axillary lymph nodes measuring up to 7 mm in short axis, nonspecific. Suggest attention on follow-up imaging 6. No convincing evidence of new or progressive disease in the chest, abdomen or pelvis. 7.  Aortic Atherosclerosis (ICD10-I70.0).     10/06/2023 Imaging   CT chest abdomen pelvis with contrast IMPRESSION: 1. Increased size of the pulmonary nodule in the lingula now measuring 7 mm, concerning for pulmonary metastatic disease recurrence. 2. Additional bilateral pulmonary nodules are stable. 3. Stable prominent bilateral axillary, low paraesophageal and retroperitoneal lymph nodes. 4. Prior partial left hemicolectomy with Hartmann's pouch formation and left anterior  abdominal wall colostomy. No suspicious nodularity along the Hartmann's suture line. 5. Patulous esophagus with symmetric distal esophageal wall thickening, similar prior. 6. Trace  right pleural effusion. 7.  Aortic Atherosclerosis (ICD10-I70.0).     12/30/2023 Imaging   CT chest, abdomen, and pelvis with contrast IMPRESSION: 1. Increased size of bilateral pulmonary nodules with new 4 mm left upper lobe and 5 mm superior segment right lower lobe pulmonary nodules, compatible with worsening pulmonary metastatic disease. 2. Stable prominent bilateral axillary and mediastinal lymph nodes. 3. Stable coarsely calcified retroperitoneal lymph nodes measuring up to 10 mm in short axis. No new pathologically enlarged abdominal or pelvic lymph nodes. 4. Stable trace right pleural effusion. 5. Prior partial left hemicolectomy with Hartmann's pouch formation and left anterior abdominal wall colostomy. No new suspicious nodularity along the suture line.      CURRENT THERAPY: Second line maintenance chemotherapy 5FU/leuc and Vectibix , restarted FOLFIRI 6/2 due to disease progression and continues Vectibix     INTERVAL HISTORY Mr. Matthew [name] returns for follow up as scheduled. He restarted FOLFIRI 6/2 and continues vectibix .  He and his mother feel he tolerated treatment well with no issues.  Eating and drinking, bowels moving, no nausea/vomiting.  She does note he sleeps a lot.  ROS  All other systems reviewed and negative  Past Medical History:  Diagnosis Date   Cancer (HCC)    Schizophrenia [facility])      Past Surgical History:  Procedure Laterality Date   BRONCHIAL BIOPSY  05/11/2022   Procedure: BRONCHIAL BIOPSIES;  Surgeon: Denson Flake, MD;  Location: [facility] ENDOSCOPY;  Service: Pulmonary;;   BRONCHIAL BRUSHINGS  05/11/2022   Procedure: BRONCHIAL BRUSHINGS;  Surgeon: Denson Flake, MD;  Location: [facility] ENDOSCOPY;  Service: Pulmonary;;   BRONCHIAL NEEDLE ASPIRATION BIOPSY  05/11/2022   Procedure:  BRONCHIAL NEEDLE ASPIRATION BIOPSIES;  Surgeon: Denson Flake, MD;  Location: [facility] ENDOSCOPY;  Service: Pulmonary;;   BRONCHIAL WASHINGS  05/11/2022   Procedure: BRONCHIAL WASHINGS;  Surgeon: Denson Flake, MD;  Location: [facility] ENDOSCOPY;  Service: Pulmonary;;   IR IMAGING GUIDED PORT INSERTION  05/25/2022   LAPAROTOMY N/A 08/08/2021   Procedure: EXPLORATORY LAPAROTOMY;  Surgeon: Adalberto Acton, MD;  Location: [facility] OR;  Service: General;  Laterality: N/A;   PARTIAL COLECTOMY N/A 08/08/2021   Procedure: PARTIAL COLECTOMY WITH COLOSTOMY;  Surgeon: Adalberto Acton, MD;  Location: [facility] OR;  Service: General;  Laterality: N/A;   VIDEO BRONCHOSCOPY WITH RADIAL ENDOBRONCHIAL ULTRASOUND  05/11/2022   Procedure: VIDEO BRONCHOSCOPY WITH RADIAL ENDOBRONCHIAL ULTRASOUND;  Surgeon: Denson Flake, MD;  Location: [facility] ENDOSCOPY;  Service: Pulmonary;;     Outpatient Encounter Medications as of 01/17/2024  Medication Sig Note   acetaminophen  (TYLENOL ) 500 MG tablet Take 2 tablets (1,000 mg total) by mouth every 6 (six) hours as needed for mild pain or moderate pain.    apixaban  (ELIQUIS ) 5 MG TABS tablet Take 1 tablet (5 mg total) by mouth 2 (two) times daily.    aspirin EC 81 MG tablet Take 81 mg by mouth daily as needed (for pain or headaches).     benztropine  (COGENTIN ) 1 MG tablet Take 1 mg at bedtime by mouth.    clindamycin  (CLINDAGEL) 1 % gel Apply topically 2 (two) times daily. To skin rash on face and upper body    ferrous sulfate  325 (65 FE) MG EC tablet Take 1 tablet (325 mg total) by mouth daily.    hydrocortisone  cream 1 % Apply 1 Application topically 2 (two) times daily as needed for itching. For rash    loperamide  (IMODIUM ) 2 MG capsule Take 1 capsule (2 mg total) by mouth  as needed for diarrhea or loose stools.    magnesium  oxide (MAG-OX) 400 (240 Mg) MG tablet Take 1 tablet (400 mg total) by mouth daily.    Multiple Vitamin (MULTIVITAMIN WITH MINERALS) TABS tablet Take 1 tablet by mouth daily.     ondansetron  (ZOFRAN ) 8 MG tablet Take 1 tablet (8 mg total) by mouth every 8 (eight) hours as needed for nausea or vomiting.    paliperidone  (INVEGA  SUSTENNA) 156 MG/ML SUSP injection Inject 156 mg every 30 (thirty) days into the muscle. 05/25/2022: Due tomorrow   polyethylene glycol (MIRALAX  / GLYCOLAX ) 17 g packet Take 17 g by mouth daily as needed for mild constipation or moderate constipation.    potassium chloride  SA (KLOR-CON  M) 20 MEQ tablet Take 1 tablet (20 mEq total) by mouth daily.    prochlorperazine  (COMPAZINE ) 10 MG tablet TAKE 1 TABLET(10 MG) BY MOUTH EVERY 6 HOURS AS NEEDED FOR NAUSEA OR VOMITING    [DISCONTINUED] heparin  lock flush 100 unit/mL     [DISCONTINUED] sodium chloride  flush (NS) 0.9 % injection 10 mL     No facility-administered encounter medications on file as of 01/17/2024.     Today's Vitals   01/17/24 1100  BP: 112/80  Pulse: 98  Resp: 16  Temp: 98 F (36.7 C)  TempSrc: Temporal  SpO2: 98%  Weight: 150 lb 3.2 oz (68.1 kg)  Height: 5' 9 (1.753 m)   Body mass index is 22.18 kg/m.   ECOG PERFORMANCE STATUS: 1 - Symptomatic but completely ambulatory  PHYSICAL EXAM GENERAL:alert, no distress and comfortable SKIN: no rash  EYES: sclera clear NECK: without mass LYMPH:  no palpable cervical or supraclavicular lymphadenopathy  LUNGS: clear with normal breathing effort HEART: regular rate & rhythm, no lower extremity edema ABDOMEN: abdomen soft, non-tender and normal bowel sounds NEURO: alert & oriented x 3 with fluent speech, no focal motor/sensory deficits PAC without erythema    CBC    Latest Ref Rng & Units 01/17/2024   10:33 AM 01/03/2024   10:43 AM 12/16/2023    1:32 PM  CBC  WBC 4.0 - 10.5 K/uL 3.6  4.4  4.8   Hemoglobin 13.0 - 17.0 g/dL 65.7  84.6  96.2   Hematocrit 39.0 - 52.0 % 37.6  41.2  39.7   Platelets 150 - 400 K/uL 302  335  267       CMP     Latest Ref Rng & Units 01/17/2024   10:33 AM 01/03/2024   10:43 AM 12/16/2023    1:32 PM   CMP  Glucose 70 - 99 mg/dL 96  952  841   BUN 6 - 20 mg/dL 10  8  11    Creatinine 0.61 - 1.24 mg/dL 3.24  4.01  0.27   Sodium 135 - 145 mmol/L 139  138  138   Potassium 3.5 - 5.1 mmol/L 3.6  3.5  3.7   Chloride 98 - 111 mmol/L 106  106  107   CO2 22 - 32 mmol/L 25  26  25    Calcium  8.9 - 10.3 mg/dL 9.2  9.4  9.2   Total Protein 6.5 - 8.1 g/dL 6.9  6.9  7.1   Total Bilirubin 0.0 - 1.2 mg/dL 0.9  1.0  0.9   Alkaline Phos 38 - 126 U/L 103  117  101   AST 15 - 41 U/L 15  21  26    ALT 0 - 44 U/L 14  29  23  ASSESSMENT & PLAN:Lochlin Keo is a 58 y.o. male with    1. Cancer of left colon, stage IIIB p(T3, N1aM0), MSS -presented to ED on 08/06/21 with sigmoid colon perforation, s/p emergent partial colectomy on 08/08/21 by Dr. Jamse Mcgee showed invasive moderately differentiated adenocarcinoma. Margins negative, one lymph node showed metastatic carcinoma (1/12). -Staging chest CT 08/15/21 was negative for metastatic disease. -he completed 4 cycles adjuvant CAPOX on 10/10/21 - May or July 2023 -Surveillance scan 01/2022 showed pulmonary nodules and retroperitoneal adenopathy, concerning for recurrent/metastatic disease -Lung biopsy confirmed malignant cells consistent with colonic primary -He began first-line FOLFIRI in 05/2022, Vectibix  added with cycle 2.  Tolerated well with good response on CT from 07/31/2022 and again with continued response in lungs and lymph nodes on CT CAP from 10/26/2022.   -Changed to maintenance 5FU/leuc and vectibix  q2 weeks, on 10/28/22 - Restaging CT 10/06/2023 showed stable disease except increased size of the pulmonary nodule in the lingula, now measuring 7 mm -Restaging CT CAP 12/30/23 showed enlarging bilateral pulmonary mets, stable disease in the abdomen, he restarted FOLFIRI and continues vectibix  01/03/24 - Mr. Hays appears stable, s/p cycle 1 FOLFIRI/Vectibix , tolerating well with fatigue.  Able to recover and function well at home with adequate performance status.   There is no clinical evidence of disease progression. - Labs reviewed, adequate to proceed with cycle 2 FOLFIRI/Vectibix  today as scheduled, no dose adjustments - Follow-up and cycle 3 in 2 weeks, or sooner if needed   2.  Pulmonary emboli -Incidental finding on restaging CT 04/23/2023.  Asymptomatic -On Eliquis , tolerating well   3. Anemia, likely iron deficient -chronic prior to diagnosis, worsened with surgery -he is currently on oral iron. -overall mild and stable    4. Schizophrenia, disabled, social support -he was diagnosed with schizophrenia in his 20's, managed with medication, he is on disability. -he lives with his mother; he is able to do ADLs for himself, but not iADLS. They are both on disability.     PLAN: -Labs reviewed -Proceed with cycle 2 FOLFIRI/Vectibix  and 2g IV mag -Pump D/C and additional 2 g IV mag on day 3 -Follow-up and cycle 3 in 2 weeks, or sooner if needed    All questions were answered. The patient knows to call the clinic with any problems, questions or concerns. No barriers to learning were detected.   Annais Crafts K Arayla Kruschke, NP 01/17/2024

## 2024-01-19 ENCOUNTER — Inpatient Hospital Stay: Payer: MEDICAID

## 2024-01-19 DIAGNOSIS — Z5111 Encounter for antineoplastic chemotherapy: Secondary | ICD-10-CM | POA: Diagnosis not present

## 2024-01-19 DIAGNOSIS — C186 Malignant neoplasm of descending colon: Secondary | ICD-10-CM

## 2024-01-19 MED ORDER — HEPARIN SOD (PORK) LOCK FLUSH 100 UNIT/ML IV SOLN: 500.0000 [IU] | Freq: Once | INTRAVENOUS | Status: DC | PRN

## 2024-01-19 MED ORDER — MAGNESIUM SULFATE 2 GM/50ML IV SOLN
2.0000 g | Freq: Once | INTRAVENOUS | Status: AC
  Administered 2024-01-19: 2 g via INTRAVENOUS
  Filled 2024-01-19: qty 50

## 2024-01-19 MED ORDER — SODIUM CHLORIDE 0.9% FLUSH: 10.0000 mL | INTRAVENOUS | Status: DC | PRN

## 2024-01-19 NOTE — Patient Instructions (Signed)
 Hypomagnesemia Hypomagnesemia is a condition in which the level of magnesium in the blood is too low. Magnesium is a mineral that is found in many foods. It is used in many different processes in the body. Hypomagnesemia can affect every organ in the body. In severe cases, it can cause life-threatening problems. What are the causes? This condition may be caused by: Not getting enough magnesium in your diet or not having enough healthy foods to eat (malnutrition). Problems with magnesium absorption in the intestines. Dehydration. Excessive use of alcohol. Vomiting. Severe or long-term (chronic) diarrhea. Some medicines, including medicines that make you urinate more often (diuretics). Certain diseases, such as kidney disease, diabetes, celiac disease, and overactive thyroid. What are the signs or symptoms? Symptoms of this condition include: Loss of appetite, nausea, and vomiting. Involuntary shaking or trembling of a body part (tremor). Muscle weakness or tingling in the arms and legs. Sudden tightening of muscles (muscle spasms). Confusion. Psychiatric issues, such as: Depression and irritability. Psychosis. A feeling of fluttering of the heart (palpitations). Seizures. These symptoms are more severe if magnesium levels drop suddenly. How is this diagnosed? This condition may be diagnosed based on: Your symptoms and medical history. A physical exam. Blood and urine tests. How is this treated? Treatment depends on the cause and the severity of the condition. It may be treated by: Taking a magnesium supplement. This can be taken in pill form. If the condition is severe, magnesium is usually given through an IV. Making changes to your diet. You may be directed to eat foods that have a lot of magnesium, such as green leafy vegetables, peas, beans, and nuts. Not drinking alcohol. If you are struggling not to drink, ask your health care provider for help. Follow these instructions at  home: Eating and drinking     Make sure that your diet includes foods with magnesium. Foods that have a lot of magnesium in them include: Green leafy vegetables, such as spinach and broccoli. Beans and peas. Nuts and seeds, such as almonds and sunflower seeds. Whole grains, such as whole grain bread and fortified cereals. Drink fluids that contain salts and minerals (electrolytes), such as sports drinks, when you are active. Do not drink alcohol. General instructions Take over-the-counter and prescription medicines only as told by your health care provider. Take magnesium supplements as directed if your health care provider tells you to take them. Have your magnesium levels monitored as told by your health care provider. Keep all follow-up visits. This is important. Contact a health care provider if: You get worse instead of better. Your symptoms return. Get help right away if: You develop severe muscle weakness. You have trouble breathing. You feel that your heart is racing. These symptoms may represent a serious problem that is an emergency. Do not wait to see if the symptoms will go away. Get medical help right away. Call your local emergency services (911 in the [place] Do not drive yourself to the hospital. Summary Hypomagnesemia is a condition in which the level of magnesium in the blood is too low. Hypomagnesemia can affect every organ in the body. Treatment may include eating more foods that contain magnesium, taking magnesium supplements, and not drinking alcohol. Have your magnesium levels monitored as told by your health care provider. This information is not intended to replace advice given to you by your health care provider. Make sure you discuss any questions you have with your health care provider. Document Revised: 12/17/2020 Document Reviewed: 12/17/2020 Elsevier Patient Education  2024 [employer]

## 2024-01-30 NOTE — Assessment & Plan Note (Signed)
 stage IIIB p(T3, N1aM0), MSS, KRAS/NRAS/BRAF wild type, lung and node metastasis in 04/2022  -Initially diagnosed in 08/2021 -he completed 4 cycles adjuvant CAPOX 10/10/21 - 12/25/21 (though he somehow was still taking Xeloda through ~02/09/22 due to misunderstandings).  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 -we have changed his treatment to maintenance therapy with 5-fu and vectibix in late March 2024 -He is tolerating treatment very well, will continue. -due to metastasis in both lungs and retroperitoneal lymph nodes, he is not a candidate for surgery or local therapy  -restaging CT 10/06/2023 showed stable disease overall except increased size of the pulmonary nodule in the lingula now measuring 7 mm Will continue current therapy.

## 2024-01-31 ENCOUNTER — Inpatient Hospital Stay (HOSPITAL_BASED_OUTPATIENT_CLINIC_OR_DEPARTMENT_OTHER): Payer: MEDICAID | Admitting: Hematology

## 2024-01-31 ENCOUNTER — Telehealth: Payer: Self-pay

## 2024-01-31 ENCOUNTER — Inpatient Hospital Stay: Payer: MEDICAID

## 2024-01-31 VITALS — BP 92/60 | HR 96 | Temp 97.6°F | Resp 15 | Ht 69.0 in | Wt 147.6 lb

## 2024-01-31 DIAGNOSIS — C189 Malignant neoplasm of colon, unspecified: Secondary | ICD-10-CM

## 2024-01-31 DIAGNOSIS — Z95828 Presence of other vascular implants and grafts: Secondary | ICD-10-CM

## 2024-01-31 DIAGNOSIS — Z5111 Encounter for antineoplastic chemotherapy: Secondary | ICD-10-CM | POA: Diagnosis not present

## 2024-01-31 DIAGNOSIS — C186 Malignant neoplasm of descending colon: Secondary | ICD-10-CM

## 2024-01-31 LAB — CBC WITH DIFFERENTIAL (CANCER CENTER ONLY)
Abs Immature Granulocytes: 0.01 10*3/uL (ref 0.00–0.07)
Basophils Absolute: 0 10*3/uL (ref 0.0–0.1)
Basophils Relative: 1 %
Eosinophils Absolute: 0.1 10*3/uL (ref 0.0–0.5)
Eosinophils Relative: 3 %
HCT: 37.3 % — ABNORMAL LOW (ref 39.0–52.0)
Hemoglobin: 12.6 g/dL — ABNORMAL LOW (ref 13.0–17.0)
Immature Granulocytes: 0 %
Lymphocytes Relative: 20 %
Lymphs Abs: 0.7 10*3/uL (ref 0.7–4.0)
MCH: 29.9 pg (ref 26.0–34.0)
MCHC: 33.8 g/dL (ref 30.0–36.0)
MCV: 88.4 fL (ref 80.0–100.0)
Monocytes Absolute: 0.3 10*3/uL (ref 0.1–1.0)
Monocytes Relative: 8 %
Neutro Abs: 2.4 10*3/uL (ref 1.7–7.7)
Neutrophils Relative %: 68 %
Platelet Count: 301 10*3/uL (ref 150–400)
RBC: 4.22 MIL/uL (ref 4.22–5.81)
RDW: 13.9 % (ref 11.5–15.5)
WBC Count: 3.6 10*3/uL — ABNORMAL LOW (ref 4.0–10.5)
nRBC: 0 % (ref 0.0–0.2)

## 2024-01-31 LAB — CMP (CANCER CENTER ONLY)
ALT: 9 U/L (ref 0–44)
AST: 14 U/L — ABNORMAL LOW (ref 15–41)
Albumin: 3.9 g/dL (ref 3.5–5.0)
Alkaline Phosphatase: 97 U/L (ref 38–126)
Anion gap: 7 (ref 5–15)
BUN: 6 mg/dL (ref 6–20)
CO2: 24 mmol/L (ref 22–32)
Calcium: 8.9 mg/dL (ref 8.9–10.3)
Chloride: 107 mmol/L (ref 98–111)
Creatinine: 0.72 mg/dL (ref 0.61–1.24)
GFR, Estimated: >60 mL/min (ref >60)
Glucose, Bld: 129 mg/dL — ABNORMAL HIGH (ref 70–99)
Potassium: 3.3 mmol/L — ABNORMAL LOW (ref 3.5–5.1)
Sodium: 138 mmol/L (ref 135–145)
Total Bilirubin: 1 mg/dL (ref 0.0–1.2)
Total Protein: 6.4 g/dL — ABNORMAL LOW (ref 6.5–8.1)

## 2024-01-31 LAB — MAGNESIUM: Magnesium: 1.7 mg/dL (ref 1.7–2.4)

## 2024-01-31 MED ORDER — SODIUM CHLORIDE 0.9 % IV SOLN
400.0000 mg/m2 | Freq: Once | INTRAVENOUS | Status: AC
  Administered 2024-01-31: 728 mg via INTRAVENOUS
  Filled 2024-01-31: qty 17.5

## 2024-01-31 MED ORDER — DEXAMETHASONE SODIUM PHOSPHATE 10 MG/ML IJ SOLN
10.0000 mg | Freq: Once | INTRAMUSCULAR | Status: AC
  Administered 2024-01-31: 10 mg via INTRAVENOUS
  Filled 2024-01-31: qty 1

## 2024-01-31 MED ORDER — SODIUM CHLORIDE 0.9 % IV SOLN
6.0000 mg/kg | Freq: Once | INTRAVENOUS | Status: AC
  Administered 2024-01-31: 400 mg via INTRAVENOUS
  Filled 2024-01-31: qty 20

## 2024-01-31 MED ORDER — SODIUM CHLORIDE 0.9 % IV SOLN
2400.0000 mg/m2 | INTRAVENOUS | Status: DC
  Administered 2024-01-31: 4350 mg via INTRAVENOUS
  Filled 2024-01-31: qty 87

## 2024-01-31 MED ORDER — SODIUM CHLORIDE 0.9% FLUSH: 10.0000 mL | INTRAVENOUS | Status: DC | PRN

## 2024-01-31 MED ORDER — HEPARIN SOD (PORK) LOCK FLUSH 100 UNIT/ML IV SOLN
500.0000 [IU] | Freq: Once | INTRAVENOUS | Status: DC | PRN
Start: 2024-01-31 — End: 2024-01-31

## 2024-01-31 MED ORDER — SODIUM CHLORIDE 0.9% FLUSH
10.0000 mL | Freq: Once | INTRAVENOUS | Status: AC
  Administered 2024-01-31: 10 mL

## 2024-01-31 MED ORDER — SODIUM CHLORIDE 0.9 % IV SOLN: Freq: Once | INTRAVENOUS | Status: AC

## 2024-01-31 MED ORDER — SODIUM CHLORIDE 0.9 % IV SOLN
180.0000 mg/m2 | Freq: Once | INTRAVENOUS | Status: AC
  Administered 2024-01-31: 320 mg via INTRAVENOUS
  Filled 2024-01-31: qty 15

## 2024-01-31 MED ORDER — MAGNESIUM OXIDE -MG SUPPLEMENT 400 (240 MG) MG PO TABS: 400.0000 mg | ORAL_TABLET | Freq: Two times a day (BID) | ORAL | 2 refills | Status: DC

## 2024-01-31 MED ORDER — ATROPINE SULFATE 1 MG/ML IV SOLN
0.5000 mg | Freq: Once | INTRAVENOUS | Status: AC | PRN
  Administered 2024-01-31: 0.5 mg via INTRAVENOUS
  Filled 2024-01-31: qty 1

## 2024-01-31 MED ORDER — PALONOSETRON HCL INJECTION 0.25 MG/5ML
0.2500 mg | Freq: Once | INTRAVENOUS | Status: AC
  Administered 2024-01-31: 0.25 mg via INTRAVENOUS
  Filled 2024-01-31: qty 5

## 2024-01-31 NOTE — Patient Instructions (Signed)
 [facility] - A DEPT OF [facility]  Discharge Instructions: Thank you for choosing [facility] to provide your oncology and hematology care.   If you have a lab appointment with the Cancer Center, please go directly to the Cancer Center and check in at the registration area.   Wear comfortable clothing and clothing appropriate for easy access to any Portacath or PICC line.   We strive to give you quality time with your provider. You may need to reschedule your appointment if you arrive late (15 or more minutes).  Arriving late affects you and other patients whose appointments are after yours.  Also, if you miss three or more appointments without notifying the office, you may be dismissed from the clinic at the provider's discretion.      For prescription refill requests, have your pharmacy contact our office and allow 72 hours for refills to be completed.    Today you received the following chemotherapy and/or immunotherapy agents vectibix , irinotecan , leucovorin , adrucil       To help prevent nausea and vomiting after your treatment, we encourage you to take your nausea medication as directed.  BELOW ARE SYMPTOMS THAT SHOULD BE REPORTED IMMEDIATELY: *FEVER GREATER THAN 100.4 F (38 C) OR HIGHER *CHILLS OR SWEATING *NAUSEA AND VOMITING THAT IS NOT CONTROLLED WITH YOUR NAUSEA MEDICATION *UNUSUAL SHORTNESS OF BREATH *UNUSUAL BRUISING OR BLEEDING *URINARY PROBLEMS (pain or burning when urinating, or frequent urination) *BOWEL PROBLEMS (unusual diarrhea, constipation, pain near the anus) TENDERNESS IN MOUTH AND THROAT WITH OR WITHOUT PRESENCE OF ULCERS (sore throat, sores in mouth, or a toothache) UNUSUAL RASH, SWELLING OR PAIN  UNUSUAL VAGINAL DISCHARGE OR ITCHING   Items with * indicate a potential emergency and should be followed up as soon as possible or go to the Emergency Department if any problems should occur.  Please show the  CHEMOTHERAPY ALERT CARD or IMMUNOTHERAPY ALERT CARD at check-in to the Emergency Department and triage nurse.  Should you have questions after your visit or need to cancel or reschedule your appointment, please contact [facility] - A DEPT OF [facility]  Dept: [phone]  and follow the prompts.  Office hours are 8:00 a.m. to 4:30 p.m. Monday - Friday. Please note that voicemails left after 4:00 p.m. may not be returned until the following business day.  We are closed weekends and major holidays. You have access to a nurse at all times for urgent questions. Please call the main number to the clinic Dept: [phone] and follow the prompts.   For any non-urgent questions, you may also contact your provider using MyChart. We now offer e-Visits for anyone 9 and older to request care online for non-urgent symptoms. For details visit [email or web address].   Also download the MyChart app! Go to the app store, search MyChart, open the app, select [facility], and log in with your MyChart username and password.

## 2024-01-31 NOTE — Telephone Encounter (Signed)
 Pt's mom called stating they received a call from transportation stating that they have appts today.  Pt's mom said the pt's appt is scheduled on 02/02/2024.  Informed pt's mom that the pt's appts are today 01/31/2024 and the pt's pump d/c appt is 02/02/2024.  Instructed pt's mom to call transportation back so they can come pick them up for the pt's appts today.  Stated pt's appts start at 1030am today.  Pt's mom verbalized understanding.  Notified Geni Caprio in Registration, [employer], and Dr. Lanny.

## 2024-01-31 NOTE — Progress Notes (Signed)
 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  -He had a metastatic recurrence in the lungs and retroperitoneal adenopathy in September 2023 -he started FOLFIRI on 05/08/2022, Vectibix  added from cycle 2, tolerating well overall -restaging CT scan image from August 02, 2022 and 10/28/2022 both showed good partial response in pulmonary metastasis, no other new lesions. -we have changed his treatment to maintenance therapy with 5-fu and vectibix  in late March 2024 -He is tolerating treatment very well, will continue. -due to metastasis in both lungs and retroperitoneal lymph nodes, he is not a candidate for surgery or local therapy  -restaging CT 10/06/2023 showed stable disease overall except increased size of the pulmonary nodule in the lingula now measuring 7 mm Will continue current therapy.  Assessment & Plan Metastatic colon cancer Metastatic colon cancer with well-managed blood counts and no new symptoms. Reports decreased appetite not related to chemotherapy. No pain, gastrointestinal issues, diarrhea, or oral mucositis. Hemoglobin stable at 12.6. Last CT scan on Dec 30, 2023, with next scan planned for early August. Continues on current chemotherapy regimen with infusion scheduled today and pump disconnection on  Wednesday. - Continue current chemotherapy regimen - Refill magnesium  supplement - Ensure adequate supply of antiemetic medication - Schedule next CT scan for early August - Schedule additional chemotherapy appointments beyond July 14  Ostomy status Ostomy functioning well without issues. No reported discomfort or complications. - Ensure supply of ostomy products is maintained  Plan - Patient is clinically doing well, lab reviewed, adequate for treatment, will proceed to chemo today continue every 2 weeks - Follow-up in 2 weeks   SUMMARY OF ONCOLOGIC HISTORY: Oncology History Overview Note   Cancer Staging  Cancer of left colon [facility]) Staging form: Colon and Rectum, AJCC 8th Edition - Pathologic stage from 08/08/2021: Stage IIIB (pT3, pN1a, cM0) - Signed by [name], MD on 08/26/2021    Cancer of left colon (HCC)  04/01/2020 Imaging   IMPRESSION: 1. Gallbladder decompressed bowel also partially calcified gallstones. No pericholecystic inflammation though if there is persisting clinical concern for cholecystitis right upper quadrant ultrasound could be obtained. 2. Circumferential thickening of the distal thoracic esophagus. Could reflect features of esophagitis. Correlate with clinical symptoms and consider endoscopy as clinically warranted. 3. Additional segmental thickening of the mid to distal sigmoid with focal narrowing. No acute surrounding inflammation or resulting obstruction. Findings are nonspecific, and could reflect sequela of prior inflammation/infection. However, recommend correlation with colonoscopy if not recently performed. 4. Mild circumferential bladder wall thickening and indentation of the bladder base by an enlarged prostate. Possibly sequela of chronic outlet obstruction though could correlate with urinalysis to exclude cystitis. 5. Aortic Atherosclerosis (ICD10-I70.0).   08/06/2021 Imaging   IMPRESSION: Sigmoid colonic perforation with small free  intraperitoneal gas and infiltration of the mesenteric and omental fat in keeping with changes of peritonitis.   Long segment  inflammatory stranding of the sigmoid colon in keeping with a severe infectious or inflammatory colitis. This terminates an area of irregular mural thickening, infiltrative soft tissue within the colonic mesentery, and focal dystrophic calcification. This may represent a chronic inflammatory process, however, a perforated malignancy could appear similarly. There are 2 separate points of perforation which again raise the question of an underlying malignancy.   7.1 cm gas and fluid containing pericolonic abscess within the sigmoid mesentery.   Marked inflammatory change of the terminal ileum adjacent to the pericolonic abscess with resultant small bowel obstruction. Fluid within the distal esophagus likely relates to gastroesophageal reflux the setting of vomiting.   Aortic Atherosclerosis (ICD10-I70.0).   08/08/2021 Cancer Staging   Staging form: Colon and Rectum, AJCC 8th Edition - Pathologic stage from 08/08/2021: Stage IIIB (pT3, pN1a, cM0) - Signed by [name], MD on 08/26/2021 Stage prefix: Initial diagnosis Total positive nodes: 1 Histologic grading system: 4 grade system Histologic grade (G): G2 Residual tumor (R): R0 - None   08/08/2021 Definitive Surgery   FINAL MICROSCOPIC DIAGNOSIS:   A. COLON, SIGMOID, PARTIAL COLECTOMY:  - Invasive moderately differentiated adenocarcinoma.  - Metastatic carcinoma involving one of twelve lymph nodes (1/12).  - See oncology table below.   ADDENDUM:  Mismatch Repair Protein (IHC)  SUMMARY INTERPRETATION: NORMAL    08/14/2021 Imaging   EXAM: CT ABDOMEN AND PELVIS WITH CONTRAST  IMPRESSION: 1. Post recent sigmoid colectomy with left lower quadrant colostomy. Two small residual foci of air within the pelvic mesentery with mild adjacent thickening, but no abscess or drainable collection. Trace non organized free fluid  and stranding in the pelvis. 2. Short segment of small bowel wall thickening and inflammation in the pelvis involving the distal ileum, likely reactive. 3. Dilated distal esophagus, stomach, and small bowel, without discrete transition point, favoring postoperative ileus. 4. Small bilateral pleural effusions and compressive atelectasis. 5. Heterogeneous partially enhancing 14 mm lymph node in the retroperitoneum at the aortoiliac bifurcation, not significantly changed from prior exam. Suspected additional lymph nodes in the anterior common iliac space, not significantly changed from prior exam. Recommend attention at follow-up. 6. Additional chronic findings as described.     08/15/2021 Imaging   EXAM: CT CHEST WITH CONTRAST  IMPRESSION: 1. No evidence of thoracic metastasis. 2. Bilateral small layering pleural effusions with passive atelectasis   08/26/2021 Initial Diagnosis   Cancer of left colon (HCC)   10/10/2021 - 12/12/2021 Chemotherapy   Patient is on Treatment Plan : COLORECTAL Xelox (Capeox) q21d     05/28/2022 - 05/28/2022 Chemotherapy   Patient is on Treatment Plan : COLORECTAL FOLFIRI + Bevacizumab q14d     05/28/2022 -  Chemotherapy   Patient is on Treatment Plan : COLORECTAL FOLFIRI + Panitumumab  q14d     07/31/2022 Imaging    IMPRESSION: Decreased bilateral pulmonary metastases.   Stable mild abdominal lymphadenopathy.   No new or progressive metastatic disease within the chest, abdomen, or pelvis.   01/18/2023 Imaging    IMPRESSION: 1. Status post Hetty pouch sigmoid colon resection with left lower quadrant end colostomy and rectal stump. Unchanged, mild wall thickening of the decompressed distal colon at the ileostomy, as well as the rectal stump. 2. Unchanged small, treated metastatic pulmonary nodules. No new nodules. 3. Unchanged enlarged, coarsely calcified treated lower aortocaval metastatic lymph nodes. No new lymphadenopathy. 4. Coronary  artery disease. 5. Cholelithiasis.   04/23/2023 Imaging   CT chest, abdomen, and pelvis with contrast  IMPRESSION: 1. Left lower lobe  segmental pulmonary emboli, incidental finding. No evidence for right heart strain. 2. Stable trace bilateral pleural effusions. 3. Stable enlarged lower left paraesophageal lymph node. 4. Stable calcified retroperitoneal lymph nodes. No new enlarged lymph nodes in the chest, abdomen or pelvis. 5. Stable subcentimeter soft tissue nodules within the mesenteric fat along the inferior ostomy. 6. Left Bosniak I benign renal cyst measuring 4.6 cm. No follow-up imaging is recommended. JACR 2018 Feb; 264-273, Management of the Incidental Renal Mass on CT, Radio[employer] 2021; 814-848, Bosniak Classification of Cystic Renal Masses, Version 2019.   07/15/2023 Imaging   CT chest abdomen and pelvis with contrast IMPRESSION: 1. Prior partial left hemicolectomy with end colostomy in the left anterior abdominal wall. Unchanged mild wall thickening of the decompressed distal colon at the ileostomy as well as the rectal stump/Hartmann's pouch. 2. Stable enlarged left lower paraesophageal lymph node. 3. Increased size of the treated pulmonary nodule in the lingula now measuring 6 mm previously 4 mm with increased in size favored artifactual given motion degrading examination of the lung bases. Suggest attention on follow-up imaging 4. Unchanged size of the treated pulmonary nodule in the right middle lobe measuring 4 mm. 5. Increased size of prominent bilateral axillary lymph nodes measuring up to 7 mm in short axis, nonspecific. Suggest attention on follow-up imaging 6. No convincing evidence of new or progressive disease in the chest, abdomen or pelvis. 7.  Aortic Atherosclerosis (ICD10-I70.0).     10/06/2023 Imaging   CT chest abdomen pelvis with contrast IMPRESSION: 1. Increased size of the pulmonary nodule in the lingula now measuring 7 mm, concerning for pulmonary  metastatic disease recurrence. 2. Additional bilateral pulmonary nodules are stable. 3. Stable prominent bilateral axillary, low paraesophageal and retroperitoneal lymph nodes. 4. Prior partial left hemicolectomy with Hartmann's pouch formation and left anterior abdominal wall colostomy. No suspicious nodularity along the Hartmann's suture line. 5. Patulous esophagus with symmetric distal esophageal wall thickening, similar prior. 6. Trace right pleural effusion. 7.  Aortic Atherosclerosis (ICD10-I70.0).     12/30/2023 Imaging   CT chest, abdomen, and pelvis with contrast IMPRESSION: 1. Increased size of bilateral pulmonary nodules with new 4 mm left upper lobe and 5 mm superior segment right lower lobe pulmonary nodules, compatible with worsening pulmonary metastatic disease. 2. Stable prominent bilateral axillary and mediastinal lymph nodes. 3. Stable coarsely calcified retroperitoneal lymph nodes measuring up to 10 mm in short axis. No new pathologically enlarged abdominal or pelvic lymph nodes. 4. Stable trace right pleural effusion. 5. Prior partial left hemicolectomy with Hartmann's pouch formation and left anterior abdominal wall colostomy. No new suspicious nodularity along the suture line.      Discussed the use of AI scribe software for clinical note transcription with the patient, who gave verbal consent to proceed.  History of Present Illness Matthew [name] is a 58 year old male with metastatic colon cancer who presents for follow-up.  He has no new symptoms related to his metastatic colon cancer, including no pain, gastrointestinal issues, diarrhea, rash, mouth sores, cough, or swelling. He experiences a recent decrease in appetite and sometimes lacks the desire to eat. He is not taking any nutritional supplements. He is currently out of his magnesium  medication, which he takes twice a day, but has an adequate supply of nausea medication, which he takes as needed. There are no  issues with his ostomy.     All other systems were reviewed with the patient and are negative.  MEDICAL HISTORY:  Past Medical History:  Diagnosis Date   Cancer (HCC)    Schizophrenia [facility])     SURGICAL HISTORY: Past Surgical History:  Procedure Laterality Date   BRONCHIAL BIOPSY  05/11/2022   Procedure: BRONCHIAL BIOPSIES;  Surgeon: Shelah Lamar RAMAN, MD;  Location: [facility] ENDOSCOPY;  Service: Pulmonary;;   BRONCHIAL BRUSHINGS  05/11/2022   Procedure: BRONCHIAL BRUSHINGS;  Surgeon: Shelah Lamar RAMAN, MD;  Location: [facility] ENDOSCOPY;  Service: Pulmonary;;   BRONCHIAL NEEDLE ASPIRATION BIOPSY  05/11/2022   Procedure: BRONCHIAL NEEDLE ASPIRATION BIOPSIES;  Surgeon: Shelah Lamar RAMAN, MD;  Location: [facility] ENDOSCOPY;  Service: Pulmonary;;   BRONCHIAL WASHINGS  05/11/2022   Procedure: BRONCHIAL WASHINGS;  Surgeon: Shelah Lamar RAMAN, MD;  Location: [facility] ENDOSCOPY;  Service: Pulmonary;;   IR IMAGING GUIDED PORT INSERTION  05/25/2022   LAPAROTOMY N/A 08/08/2021   Procedure: EXPLORATORY LAPAROTOMY;  Surgeon: [name], MD;  Location: [facility] OR;  Service: General;  Laterality: N/A;   PARTIAL COLECTOMY N/A 08/08/2021   Procedure: PARTIAL COLECTOMY WITH COLOSTOMY;  Surgeon: [name], MD;  Location: [facility] OR;  Service: General;  Laterality: N/A;   VIDEO BRONCHOSCOPY WITH RADIAL ENDOBRONCHIAL ULTRASOUND  05/11/2022   Procedure: VIDEO BRONCHOSCOPY WITH RADIAL ENDOBRONCHIAL ULTRASOUND;  Surgeon: Shelah Lamar RAMAN, MD;  Location: [facility] ENDOSCOPY;  Service: Pulmonary;;    I have reviewed the social history and family history with the patient and they are unchanged from previous note.  ALLERGIES:  has no known allergies.  MEDICATIONS:  Current Outpatient Medications  Medication Sig Dispense Refill   acetaminophen  (TYLENOL ) 500 MG tablet Take 2 tablets (1,000 mg total) by mouth every 6 (six) hours as needed for mild pain or moderate pain.  0   apixaban  (ELIQUIS ) 5 MG TABS tablet Take 1 tablet (5 mg total) by mouth 2  (two) times daily. 60 tablet 2   aspirin EC 81 MG tablet Take 81 mg by mouth daily as needed (for pain or headaches).      benztropine  (COGENTIN ) 1 MG tablet Take 1 mg at bedtime by mouth.     clindamycin  (CLINDAGEL) 1 % gel Apply topically 2 (two) times daily. To skin rash on face and upper body 30 g 0   ferrous sulfate  325 (65 FE) MG EC tablet Take 1 tablet (325 mg total) by mouth daily. 30 tablet 3   hydrocortisone  cream 1 % Apply 1 Application topically 2 (two) times daily as needed for itching. For rash 30 g 2   loperamide  (IMODIUM ) 2 MG capsule Take 1 capsule (2 mg total) by mouth as needed for diarrhea or loose stools. 30 capsule 0   Multiple Vitamin (MULTIVITAMIN WITH MINERALS) TABS tablet Take 1 tablet by mouth daily.     ondansetron  (ZOFRAN ) 8 MG tablet Take 1 tablet (8 mg total) by mouth every 8 (eight) hours as needed for nausea or vomiting. 30 tablet 0   paliperidone  (INVEGA  SUSTENNA) 156 MG/ML SUSP injection Inject 156 mg every 30 (thirty) days into the muscle.     polyethylene glycol (MIRALAX  / GLYCOLAX ) 17 g packet Take 17 g by mouth daily as needed for mild constipation or moderate constipation.  0   potassium chloride  SA (KLOR-CON  M) 20 MEQ tablet Take 1 tablet (20 mEq total) by mouth daily. 30 tablet 2   prochlorperazine  (COMPAZINE ) 10 MG tablet TAKE 1 TABLET(10 MG) BY MOUTH EVERY 6 HOURS AS NEEDED FOR NAUSEA OR VOMITING 30 tablet 0   magnesium  oxide (MAG-OX) 400 (240 Mg) MG tablet Take 1 tablet (400 mg total)  by mouth 2 (two) times daily. 30 tablet 2   No current facility-administered medications for this visit.   Facility-Administered Medications Ordered in Other Visits  Medication Dose Route Frequency Provider Last Rate Last Admin   fluorouracil  (ADRUCIL ) 4,350 mg in sodium chloride  0.9 % 63 mL chemo infusion  2,400 mg/m2 (Treatment Plan Recorded) Intravenous 1 day or 1 dose [name], MD   Infusion Verify at 01/31/24 1609   heparin  lock flush 100 unit/mL  500 Units  Intracatheter Once PRN [name], MD       sodium chloride  flush (NS) 0.9 % injection 10 mL  10 mL Intracatheter PRN [name], MD        PHYSICAL EXAMINATION: ECOG PERFORMANCE STATUS: 1 - Symptomatic but completely ambulatory  Vitals:   01/31/24 1048  BP: 92/60  Pulse: 96  Resp: 15  Temp: 97.6 F (36.4 C)  SpO2: 97%   Wt Readings from Last 3 Encounters:  01/31/24 147 lb 9.6 oz (67 kg)  01/17/24 150 lb 3.2 oz (68.1 kg)  01/05/24 151 lb 8 oz (68.7 kg)     GENERAL:alert, no distress and comfortable SKIN: skin color, texture, turgor are normal, no rashes or significant lesions EYES: normal, Conjunctiva are pink and non-injected, sclera clear NECK: supple, thyroid normal size, non-tender, without nodularity LYMPH:  no palpable lymphadenopathy in the cervical, axillary  LUNGS: clear to auscultation and percussion with normal breathing effort HEART: regular rate & rhythm and no murmurs and no lower extremity edema ABDOMEN:abdomen soft, non-tender and normal bowel sounds Musculoskeletal:no cyanosis of digits and no clubbing  NEURO: alert & oriented x 3 with fluent speech, no focal motor/sensory deficits  Physical Exam    LABORATORY DATA:  I have reviewed the data as listed    Latest Ref Rng & Units 01/31/2024   10:25 AM 01/17/2024   10:33 AM 01/03/2024   10:43 AM  CBC  WBC 4.0 - 10.5 K/uL 3.6  3.6  4.4   Hemoglobin 13.0 - 17.0 g/dL 87.3  87.2  86.0   Hematocrit 39.0 - 52.0 % 37.3  37.6  41.2   Platelets 150 - 400 K/uL 301  302  335         Latest Ref Rng & Units 01/31/2024   10:25 AM 01/17/2024   10:33 AM 01/03/2024   10:43 AM  CMP  Glucose 70 - 99 mg/dL 870  96  895   BUN 6 - 20 mg/dL 6  10  8    Creatinine 0.61 - 1.24 mg/dL 9.27  9.29  9.26   Sodium 135 - 145 mmol/L 138  139  138   Potassium 3.5 - 5.1 mmol/L 3.3  3.6  3.5   Chloride 98 - 111 mmol/L 107  106  106   CO2 22 - 32 mmol/L 24  25  26    Calcium  8.9 - 10.3 mg/dL 8.9  9.2  9.4   Total Protein 6.5 - 8.1 g/dL  6.4  6.9  6.9   Total Bilirubin 0.0 - 1.2 mg/dL 1.0  0.9  1.0   Alkaline Phos 38 - 126 U/L 97  103  117   AST 15 - 41 U/L 14  15  21    ALT 0 - 44 U/L 9  14  29        RADIOGRAPHIC STUDIES: I have personally reviewed the radiological images as listed and agreed with the findings in the report. No results found.    Orders Placed This Encounter  Procedures  Magnesium     Standing Status:   Future    Expected Date:   02/28/2024    Expiration Date:   02/27/2025   CBC with Differential (Cancer Center Only)    Standing Status:   Future    Expected Date:   03/13/2024    Expiration Date:   03/13/2025   CMP (Cancer Center only)    Standing Status:   Future    Expected Date:   03/13/2024    Expiration Date:   03/14/2025   Magnesium     Standing Status:   Future    Expected Date:   03/13/2024    Expiration Date:   03/13/2025   CBC with Differential (Cancer Center Only)    Standing Status:   Future    Expected Date:   03/27/2024    Expiration Date:   03/27/2025   CMP (Cancer Center only)    Standing Status:   Future    Expected Date:   03/27/2024    Expiration Date:   03/28/2025   Magnesium     Standing Status:   Future    Expected Date:   03/27/2024    Expiration Date:   03/27/2025   All questions were answered. The patient knows to call the clinic with any problems, questions or concerns. No barriers to learning was detected. The total time spent in the appointment was 25 minutes, including review of chart and various tests results, discussions about plan of care and coordination of care plan     Onita Mattock, MD 01/31/2024

## 2024-02-01 ENCOUNTER — Other Ambulatory Visit: Payer: Self-pay

## 2024-02-01 ENCOUNTER — Other Ambulatory Visit: Payer: Self-pay | Admitting: Hematology

## 2024-02-02 ENCOUNTER — Inpatient Hospital Stay: Payer: MEDICAID | Attending: Hematology

## 2024-02-02 VITALS — BP 104/71 | HR 85 | Resp 17

## 2024-02-02 DIAGNOSIS — C7802 Secondary malignant neoplasm of left lung: Secondary | ICD-10-CM | POA: Diagnosis not present

## 2024-02-02 DIAGNOSIS — Z5111 Encounter for antineoplastic chemotherapy: Secondary | ICD-10-CM | POA: Insufficient documentation

## 2024-02-02 DIAGNOSIS — Z79899 Other long term (current) drug therapy: Secondary | ICD-10-CM | POA: Insufficient documentation

## 2024-02-02 DIAGNOSIS — E876 Hypokalemia: Secondary | ICD-10-CM | POA: Diagnosis not present

## 2024-02-02 DIAGNOSIS — C7801 Secondary malignant neoplasm of right lung: Secondary | ICD-10-CM | POA: Diagnosis not present

## 2024-02-02 DIAGNOSIS — Z79634 Long term (current) use of topoisomerase inhibitor: Secondary | ICD-10-CM | POA: Diagnosis not present

## 2024-02-02 DIAGNOSIS — C7800 Secondary malignant neoplasm of unspecified lung: Secondary | ICD-10-CM

## 2024-02-02 DIAGNOSIS — C186 Malignant neoplasm of descending colon: Secondary | ICD-10-CM | POA: Insufficient documentation

## 2024-02-02 DIAGNOSIS — Z95828 Presence of other vascular implants and grafts: Secondary | ICD-10-CM

## 2024-02-02 DIAGNOSIS — Z79631 Long term (current) use of antimetabolite agent: Secondary | ICD-10-CM | POA: Insufficient documentation

## 2024-02-02 DIAGNOSIS — Z5112 Encounter for antineoplastic immunotherapy: Secondary | ICD-10-CM | POA: Insufficient documentation

## 2024-02-02 MED ORDER — HEPARIN SOD (PORK) LOCK FLUSH 100 UNIT/ML IV SOLN
500.0000 [IU] | Freq: Once | INTRAVENOUS | Status: AC
  Administered 2024-02-02: 500 [IU]

## 2024-02-02 MED ORDER — SODIUM CHLORIDE 0.9% FLUSH
10.0000 mL | Freq: Once | INTRAVENOUS | Status: AC
  Administered 2024-02-02: 10 mL

## 2024-02-02 MED ORDER — SODIUM CHLORIDE 0.9 % IV SOLN: INTRAVENOUS | Status: DC

## 2024-02-02 MED ORDER — MAGNESIUM SULFATE 2 GM/50ML IV SOLN
2.0000 g | Freq: Once | INTRAVENOUS | Status: AC
  Administered 2024-02-02: 2 g via INTRAVENOUS
  Filled 2024-02-02: qty 50

## 2024-02-02 NOTE — Patient Instructions (Signed)
 Magnesium Sulfate Injection What is this medication? MAGNESIUM SULFATE (mag NEE zee um SUL fate) prevents and treats low levels of magnesium in your body. It may also be used to prevent and treat seizures during pregnancy in people with high blood pressure disorders, such as preeclampsia or eclampsia. Magnesium plays an important role in maintaining the health of your muscles and nervous system. This medicine may be used for other purposes; ask your health care provider or pharmacist if you have questions. What should I tell my care team before I take this medication? They need to know if you have any of these conditions: Heart disease History of irregular heart beat Kidney disease An unusual or allergic reaction to magnesium sulfate, medications, foods, dyes, or preservatives Pregnant or trying to get pregnant Breast-feeding How should I use this medication? This medication is for infusion into a vein. It is given in a hospital or clinic setting. Talk to your care team about the use of this medication in children. While this medication may be prescribed for selected conditions, precautions do apply. Overdosage: If you think you have taken too much of this medicine contact a poison control center or emergency room at once. NOTE: This medicine is only for you. Do not share this medicine with others. What if I miss a dose? This does not apply. What may interact with this medication? Certain medications for anxiety or sleep Certain medications for seizures, such phenobarbital Digoxin Medications that relax muscles for surgery Narcotic medications for pain This list may not describe all possible interactions. Give your health care provider a list of all the medicines, herbs, non-prescription drugs, or dietary supplements you use. Also tell them if you smoke, drink alcohol, or use illegal drugs. Some items may interact with your medicine. What should I watch for while using this  medication? Your condition will be monitored carefully while you are receiving this medication. You may need blood work done while you are receiving this medication. What side effects may I notice from receiving this medication? Side effects that you should report to your care team as soon as possible: Allergic reactions--skin rash, itching, hives, swelling of the face, lips, tongue, or throat High magnesium level--confusion, drowsiness, facial flushing, redness, sweating, muscle weakness, fast or irregular heartbeat, trouble breathing Low blood pressure--dizziness, feeling faint or lightheaded, blurry vision Side effects that usually do not require medical attention (report to your care team if they continue or are bothersome): Headache Nausea This list may not describe all possible side effects. Call your doctor for medical advice about side effects. You may report side effects to FDA at 1-800-FDA-1088. Where should I keep my medication? This medication is given in a hospital or clinic and will not be stored at home. NOTE: This sheet is a summary. It may not cover all possible information. If you have questions about this medicine, talk to your doctor, pharmacist, or health care provider.  2024 Elsevier/Gold Standard (2021-04-02 00:00:00)

## 2024-02-03 ENCOUNTER — Telehealth: Payer: Self-pay

## 2024-02-03 ENCOUNTER — Ambulatory Visit: Payer: Self-pay | Admitting: Nurse Practitioner

## 2024-02-03 ENCOUNTER — Other Ambulatory Visit: Payer: Self-pay

## 2024-02-03 MED ORDER — POTASSIUM CHLORIDE CRYS ER 20 MEQ PO TBCR: 20.0000 meq | EXTENDED_RELEASE_TABLET | Freq: Every day | ORAL | 2 refills | Status: DC

## 2024-02-03 NOTE — Progress Notes (Signed)
 Order placed based on Result Notes Message:  01/31/2024 - Results: Interface, Lab In [place] and others (Newest Message First)           Burton, Lacie K, NP to [facility]  Daphane Norleen MATSU, CMA  (Selected Message)    02/03/24  9:05 AM Result Note Please call pt to have him restart oral KCL supplement, 20 mEq once daily. Please refill if needed. Thanks Lacie NP Magnesium ; CMP (Cancer Center only); CBC with Differential (Cancer Center Only)

## 2024-02-03 NOTE — Telephone Encounter (Signed)
 Spoke with pt's mom to inform her that pt's K+ is low and Lacie Burton, NP prescribed Klor-Con  20meq tab daily.  Stated the prescription was sent to [facility] in [place], [place].  Pt's mom verbalized understanding and stated she will pick the prescription up today.

## 2024-02-13 NOTE — Assessment & Plan Note (Signed)
 stage IIIB p(T3, N1aM0), MSS, KRAS/NRAS/BRAF wild type, lung and node metastasis in 04/2022  -Initially diagnosed in 08/2021 -he completed 4 cycles adjuvant CAPOX 10/10/21 - 12/25/21 (though he somehow was still taking Xeloda  through ~02/09/22 due to misunderstandings).  -He had a metastatic recurrence in the lungs and retroperitoneal adenopathy in September 2023 -he started FOLFIRI on 05/08/2022, Vectibix  added from cycle 2, tolerating well overall -restaging CT scan image from August 02, 2022 and 10/28/2022 both showed good partial response in pulmonary metastasis, no other new lesions. -we have changed his treatment to maintenance therapy with 5-fu and vectibix  in late March 2024 -He is tolerating treatment very well, will continue. -due to metastasis in both lungs and retroperitoneal lymph nodes, he is not a candidate for surgery or local therapy  -restaging CT 10/06/2023 showed stable disease overall except increased size of the pulmonary nodule in the lingula now measuring 7 mm Will continue current therapy. - Restaging CT scan on the 29th 2025 showed mild disease progression in the lung, I changed his minute therapy back to FOLFIRI and Vectibix  on 01/03/2024

## 2024-02-14 ENCOUNTER — Inpatient Hospital Stay: Payer: MEDICAID

## 2024-02-14 ENCOUNTER — Encounter: Payer: Self-pay | Admitting: Hematology

## 2024-02-14 ENCOUNTER — Inpatient Hospital Stay (HOSPITAL_BASED_OUTPATIENT_CLINIC_OR_DEPARTMENT_OTHER): Payer: MEDICAID | Admitting: Hematology

## 2024-02-14 VITALS — BP 110/71 | HR 74 | Temp 97.7°F | Resp 20

## 2024-02-14 VITALS — BP 94/60 | HR 107 | Temp 97.9°F | Resp 17 | Ht 69.0 in | Wt 145.6 lb

## 2024-02-14 DIAGNOSIS — C186 Malignant neoplasm of descending colon: Secondary | ICD-10-CM

## 2024-02-14 DIAGNOSIS — Z5111 Encounter for antineoplastic chemotherapy: Secondary | ICD-10-CM | POA: Diagnosis not present

## 2024-02-14 DIAGNOSIS — C7800 Secondary malignant neoplasm of unspecified lung: Secondary | ICD-10-CM

## 2024-02-14 DIAGNOSIS — Z95828 Presence of other vascular implants and grafts: Secondary | ICD-10-CM

## 2024-02-14 LAB — CBC WITH DIFFERENTIAL (CANCER CENTER ONLY)
Abs Immature Granulocytes: 0.02 K/uL (ref 0.00–0.07)
Basophils Absolute: 0 K/uL (ref 0.0–0.1)
Basophils Relative: 1 %
Eosinophils Absolute: 0.1 K/uL (ref 0.0–0.5)
Eosinophils Relative: 2 %
HCT: 36.3 % — ABNORMAL LOW (ref 39.0–52.0)
Hemoglobin: 12.2 g/dL — ABNORMAL LOW (ref 13.0–17.0)
Immature Granulocytes: 0 %
Lymphocytes Relative: 22 %
Lymphs Abs: 1 K/uL (ref 0.7–4.0)
MCH: 29.4 pg (ref 26.0–34.0)
MCHC: 33.6 g/dL (ref 30.0–36.0)
MCV: 87.5 fL (ref 80.0–100.0)
Monocytes Absolute: 0.4 K/uL (ref 0.1–1.0)
Monocytes Relative: 8 %
Neutro Abs: 3.1 K/uL (ref 1.7–7.7)
Neutrophils Relative %: 67 %
Platelet Count: 295 K/uL (ref 150–400)
RBC: 4.15 MIL/uL — ABNORMAL LOW (ref 4.22–5.81)
RDW: 14.4 % (ref 11.5–15.5)
WBC Count: 4.6 K/uL (ref 4.0–10.5)
nRBC: 0 % (ref 0.0–0.2)

## 2024-02-14 LAB — CMP (CANCER CENTER ONLY)
ALT: 8 U/L (ref 0–44)
AST: 12 U/L — ABNORMAL LOW (ref 15–41)
Albumin: 3.7 g/dL (ref 3.5–5.0)
Alkaline Phosphatase: 80 U/L (ref 38–126)
Anion gap: 6 (ref 5–15)
BUN: 9 mg/dL (ref 6–20)
CO2: 25 mmol/L (ref 22–32)
Calcium: 9.1 mg/dL (ref 8.9–10.3)
Chloride: 108 mmol/L (ref 98–111)
Creatinine: 0.7 mg/dL (ref 0.61–1.24)
GFR, Estimated: >60 mL/min (ref >60)
Glucose, Bld: 94 mg/dL (ref 70–99)
Potassium: 3.7 mmol/L (ref 3.5–5.1)
Sodium: 139 mmol/L (ref 135–145)
Total Bilirubin: 0.7 mg/dL (ref 0.0–1.2)
Total Protein: 6.3 g/dL — ABNORMAL LOW (ref 6.5–8.1)

## 2024-02-14 LAB — MAGNESIUM: Magnesium: 1.7 mg/dL (ref 1.7–2.4)

## 2024-02-14 MED ORDER — SODIUM CHLORIDE 0.9 % IV SOLN: Freq: Once | INTRAVENOUS | Status: AC

## 2024-02-14 MED ORDER — SODIUM CHLORIDE 0.9 % IV SOLN
180.0000 mg/m2 | Freq: Once | INTRAVENOUS | Status: AC
  Administered 2024-02-14: 320 mg via INTRAVENOUS
  Filled 2024-02-14: qty 15

## 2024-02-14 MED ORDER — PALONOSETRON HCL INJECTION 0.25 MG/5ML
0.2500 mg | Freq: Once | INTRAVENOUS | Status: AC
  Administered 2024-02-14: 0.25 mg via INTRAVENOUS
  Filled 2024-02-14: qty 5

## 2024-02-14 MED ORDER — DEXAMETHASONE SODIUM PHOSPHATE 10 MG/ML IJ SOLN
10.0000 mg | Freq: Once | INTRAMUSCULAR | Status: AC
  Administered 2024-02-14: 10 mg via INTRAVENOUS
  Filled 2024-02-14: qty 1

## 2024-02-14 MED ORDER — SODIUM CHLORIDE 0.9 % IV SOLN
6.0000 mg/kg | Freq: Once | INTRAVENOUS | Status: AC
  Administered 2024-02-14: 400 mg via INTRAVENOUS
  Filled 2024-02-14: qty 20

## 2024-02-14 MED ORDER — ATROPINE SULFATE 1 MG/ML IV SOLN
0.5000 mg | Freq: Once | INTRAVENOUS | Status: AC | PRN
  Administered 2024-02-14: 0.5 mg via INTRAVENOUS
  Filled 2024-02-14: qty 1

## 2024-02-14 MED ORDER — SODIUM CHLORIDE 0.9 % IV SOLN
2400.0000 mg/m2 | INTRAVENOUS | Status: DC
  Administered 2024-02-14: 4350 mg via INTRAVENOUS
  Filled 2024-02-14: qty 87

## 2024-02-14 MED ORDER — SODIUM CHLORIDE 0.9% FLUSH
10.0000 mL | Freq: Once | INTRAVENOUS | Status: AC
Start: 2024-02-14 — End: 2024-02-14
  Administered 2024-02-14: 10 mL

## 2024-02-14 MED ORDER — SODIUM CHLORIDE 0.9 % IV SOLN
400.0000 mg/m2 | Freq: Once | INTRAVENOUS | Status: AC
  Administered 2024-02-14: 728 mg via INTRAVENOUS
  Filled 2024-02-14: qty 25

## 2024-02-14 NOTE — Progress Notes (Signed)
 [facility]   [phone] [phone]   Clinic Follow up Note   Patient Care Team: Medicine, [facility] as PCP - General [name], MD as Consulting Physician (General Surgery) [name], MD as Consulting Physician (Oncology)  Date of Service:  02/14/2024  CHIEF COMPLAINT: f/u of metastatic colon cancer  CURRENT THERAPY:  FOLFIRI and Vectibix   Oncology History   Cancer of left colon (HCC) stage IIIB p(T3, N1aM0), MSS, KRAS/NRAS/BRAF wild type, lung and node metastasis in 04/2022  -Initially diagnosed in 08/2021 -he completed 4 cycles adjuvant CAPOX 10/10/21 - 12/25/21 (though he somehow was still taking Xeloda  through ~02/09/22 due to misunderstandings).  -He had a metastatic recurrence in the lungs and retroperitoneal adenopathy in September 2023 -he started FOLFIRI on 05/08/2022, Vectibix  added from cycle 2, tolerating well overall -restaging CT scan image from August 02, 2022 and 10/28/2022 both showed good partial response in pulmonary metastasis, no other new lesions. -we have changed his treatment to maintenance therapy with 5-fu and vectibix  in late March 2024 -He is tolerating treatment very well, will continue. -due to metastasis in both lungs and retroperitoneal lymph nodes, he is not a candidate for surgery or local therapy  -restaging CT 10/06/2023 showed stable disease overall except increased size of the pulmonary nodule in the lingula now measuring 7 mm Will continue current therapy. - Restaging CT scan on the 29th 2025 showed mild disease progression in the lung, I changed his minute therapy back to FOLFIRI and Vectibix  on 01/03/2024  Assessment & Plan Metastatic colorectal cancer Currently undergoing chemotherapy with Irinotecan . No issues with the last cycle. No diarrhea or rash. Skin darkness attributed to chemotherapy. Weight loss of 5 pounds since last month, likely due to decreased appetite and irregular eating  habits. Currently on cycle 4 with plans to repeat a scan after cycle 6. - Continue chemotherapy regimen with Irinotecan . - Repeat scan after cycle 6 of chemotherapy. - Schedule next chemotherapy treatments for August 11 and August 25. - Encourage nutritional supplementation with Ensure or similar products to address weight loss. - Encourage regular eating habits, including breakfast options like soup, milk, or yogurt.  Unspecified protein-calorie malnutrition Weight loss of 5 pounds since last month, likely due to decreased appetite and irregular eating habits. Importance of nutritional supplementation and regular meals discussed. - Encourage nutritional supplementation with Ensure or similar products. - Encourage regular eating habits, including breakfast options like soup, milk, or yogurt.  Hypokalemia Previous potassium levels were low. Current potassium and magnesium  levels are pending. He is taking potassium and magnesium  supplements twice daily. - Continue potassium and magnesium  supplementation twice daily. - Monitor potassium and magnesium  levels.  Plan - Patient is overall tolerating chemotherapy well, will continue - Lab reviewed, adequate for treatment, will proceed chemo today - Follow-up in 2 weeks before next cycle chemo   SUMMARY OF ONCOLOGIC HISTORY: Oncology History Overview Note   Cancer Staging  Cancer of left colon [facility]) Staging form: Colon and Rectum, AJCC 8th Edition - Pathologic stage from 08/08/2021: Stage IIIB (pT3, pN1a, cM0) - Signed by [name], MD on 08/26/2021    Cancer of left colon (HCC)  04/01/2020 Imaging   IMPRESSION: 1. Gallbladder decompressed bowel also partially calcified gallstones. No pericholecystic inflammation though if there is persisting clinical concern for cholecystitis right upper quadrant ultrasound could be obtained. 2. Circumferential thickening of the distal thoracic esophagus. Could reflect features of esophagitis. Correlate  with clinical symptoms and consider endoscopy as  clinically warranted. 3. Additional segmental thickening of the mid to distal sigmoid with focal narrowing. No acute surrounding inflammation or resulting obstruction. Findings are nonspecific, and could reflect sequela of prior inflammation/infection. However, recommend correlation with colonoscopy if not recently performed. 4. Mild circumferential bladder wall thickening and indentation of the bladder base by an enlarged prostate. Possibly sequela of chronic outlet obstruction though could correlate with urinalysis to exclude cystitis. 5. Aortic Atherosclerosis (ICD10-I70.0).   08/06/2021 Imaging   IMPRESSION: Sigmoid colonic perforation with small free intraperitoneal gas and infiltration of the mesenteric and omental fat in keeping with changes of peritonitis.   Long segment inflammatory stranding of the sigmoid colon in keeping with a severe infectious or inflammatory colitis. This terminates an area of irregular mural thickening, infiltrative soft tissue within the colonic mesentery, and focal dystrophic calcification. This may represent a chronic inflammatory process, however, a perforated malignancy could appear similarly. There are 2 separate points of perforation which again raise the question of an underlying malignancy.   7.1 cm gas and fluid containing pericolonic abscess within the sigmoid mesentery.   Marked inflammatory change of the terminal ileum adjacent to the pericolonic abscess with resultant small bowel obstruction. Fluid within the distal esophagus likely relates to gastroesophageal reflux the setting of vomiting.   Aortic Atherosclerosis (ICD10-I70.0).   08/08/2021 Cancer Staging   Staging form: Colon and Rectum, AJCC 8th Edition - Pathologic stage from 08/08/2021: Stage IIIB (pT3, pN1a, cM0) - Signed by [name], MD on 08/26/2021 Stage prefix: Initial diagnosis Total positive nodes: 1 Histologic grading system: 4  grade system Histologic grade (G): G2 Residual tumor (R): R0 - None   08/08/2021 Definitive Surgery   FINAL MICROSCOPIC DIAGNOSIS:   A. COLON, SIGMOID, PARTIAL COLECTOMY:  - Invasive moderately differentiated adenocarcinoma.  - Metastatic carcinoma involving one of twelve lymph nodes (1/12).  - See oncology table below.   ADDENDUM:  Mismatch Repair Protein (I[facility])  SUMMARY INTERPRETATION: NORMAL    08/14/2021 Imaging   EXAM: CT ABDOMEN AND PELVIS WITH CONTRAST  IMPRESSION: 1. Post recent sigmoid colectomy with left lower quadrant colostomy. Two small residual foci of air within the pelvic mesentery with mild adjacent thickening, but no abscess or drainable collection. Trace non organized free fluid and stranding in the pelvis. 2. Short segment of small bowel wall thickening and inflammation in the pelvis involving the distal ileum, likely reactive. 3. Dilated distal esophagus, stomach, and small bowel, without discrete transition point, favoring postoperative ileus. 4. Small bilateral pleural effusions and compressive atelectasis. 5. Heterogeneous partially enhancing 14 mm lymph node in the retroperitoneum at the aortoiliac bifurcation, not significantly changed from prior exam. Suspected additional lymph nodes in the anterior common iliac space, not significantly changed from prior exam. Recommend attention at follow-up. 6. Additional chronic findings as described.     08/15/2021 Imaging   EXAM: CT CHEST WITH CONTRAST  IMPRESSION: 1. No evidence of thoracic metastasis. 2. Bilateral small layering pleural effusions with passive atelectasis   08/26/2021 Initial Diagnosis   Cancer of left colon (HCC)   10/10/2021 - 12/12/2021 Chemotherapy   Patient is on Treatment Plan : COLORECTAL Xelox (Capeox) q21d     05/28/2022 - 05/28/2022 Chemotherapy   Patient is on Treatment Plan : COLORECTAL FOLFIRI + Bevacizumab q14d     05/28/2022 -  Chemotherapy   Patient is on Treatment Plan :  COLORECTAL FOLFIRI + Panitumumab  q14d     07/31/2022 Imaging    IMPRESSION: Decreased bilateral pulmonary metastases.   Stable mild  abdominal lymphadenopathy.   No new or progressive metastatic disease within the chest, abdomen, or pelvis.   01/18/2023 Imaging    IMPRESSION: 1. Status post Hetty pouch sigmoid colon resection with left lower quadrant end colostomy and rectal stump. Unchanged, mild wall thickening of the decompressed distal colon at the ileostomy, as well as the rectal stump. 2. Unchanged small, treated metastatic pulmonary nodules. No new nodules. 3. Unchanged enlarged, coarsely calcified treated lower aortocaval metastatic lymph nodes. No new lymphadenopathy. 4. Coronary artery disease. 5. Cholelithiasis.   04/23/2023 Imaging   CT chest, abdomen, and pelvis with contrast  IMPRESSION: 1. Left lower lobe segmental pulmonary emboli, incidental finding. No evidence for right heart strain. 2. Stable trace bilateral pleural effusions. 3. Stable enlarged lower left paraesophageal lymph node. 4. Stable calcified retroperitoneal lymph nodes. No new enlarged lymph nodes in the chest, abdomen or pelvis. 5. Stable subcentimeter soft tissue nodules within the mesenteric fat along the inferior ostomy. 6. Left Bosniak I benign renal cyst measuring 4.6 cm. No follow-up imaging is recommended. JACR 2018 Feb; 264-273, Management of the Incidental Renal Mass on CT, [employer] 2021; 814-848, Bosniak Classification of Cystic Renal Masses, Version 2019.   07/15/2023 Imaging   CT chest abdomen and pelvis with contrast IMPRESSION: 1. Prior partial left hemicolectomy with end colostomy in the left anterior abdominal wall. Unchanged mild wall thickening of the decompressed distal colon at the ileostomy as well as the rectal stump/Hartmann's pouch. 2. Stable enlarged left lower paraesophageal lymph node. 3. Increased size of the treated pulmonary nodule in the lingula now measuring  6 mm previously 4 mm with increased in size favored artifactual given motion degrading examination of the lung bases. Suggest attention on follow-up imaging 4. Unchanged size of the treated pulmonary nodule in the right middle lobe measuring 4 mm. 5. Increased size of prominent bilateral axillary lymph nodes measuring up to 7 mm in short axis, nonspecific. Suggest attention on follow-up imaging 6. No convincing evidence of new or progressive disease in the chest, abdomen or pelvis. 7.  Aortic Atherosclerosis (ICD10-I70.0).     10/06/2023 Imaging   CT chest abdomen pelvis with contrast IMPRESSION: 1. Increased size of the pulmonary nodule in the lingula now measuring 7 mm, concerning for pulmonary metastatic disease recurrence. 2. Additional bilateral pulmonary nodules are stable. 3. Stable prominent bilateral axillary, low paraesophageal and retroperitoneal lymph nodes. 4. Prior partial left hemicolectomy with Hartmann's pouch formation and left anterior abdominal wall colostomy. No suspicious nodularity along the Hartmann's suture line. 5. Patulous esophagus with symmetric distal esophageal wall thickening, similar prior. 6. Trace right pleural effusion. 7.  Aortic Atherosclerosis (ICD10-I70.0).     12/30/2023 Imaging   CT chest, abdomen, and pelvis with contrast IMPRESSION: 1. Increased size of bilateral pulmonary nodules with new 4 mm left upper lobe and 5 mm superior segment right lower lobe pulmonary nodules, compatible with worsening pulmonary metastatic disease. 2. Stable prominent bilateral axillary and mediastinal lymph nodes. 3. Stable coarsely calcified retroperitoneal lymph nodes measuring up to 10 mm in short axis. No new pathologically enlarged abdominal or pelvic lymph nodes. 4. Stable trace right pleural effusion. 5. Prior partial left hemicolectomy with Hartmann's pouch formation and left anterior abdominal wall colostomy. No new suspicious nodularity along the suture line.       Discussed the use of AI scribe software for clinical note transcription with the patient, who gave verbal consent to proceed.  History of Present Illness Matthew [name] is a 58 year old male with metastatic  colon cancer who presents for follow-up.  He is undergoing chemotherapy and tolerates it well without significant side effects such as diarrhea or rash. He has lost five pounds since last month due to decreased appetite, especially in the morning, and does not eat breakfast. He takes magnesium  and potassium supplements twice daily. His last magnesium  level was 1.7, and his potassium was low. Today's magnesium  and potassium levels are pending. He experiences dryness and darkness of the skin. He remains available for regular treatment every two weeks.     All other systems were reviewed with the patient and are negative.  MEDICAL HISTORY:  Past Medical History:  Diagnosis Date   Cancer (HCC)    Schizophrenia [facility])     SURGICAL HISTORY: Past Surgical History:  Procedure Laterality Date   BRONCHIAL BIOPSY  05/11/2022   Procedure: BRONCHIAL BIOPSIES;  Surgeon: Shelah Lamar RAMAN, MD;  Location: [facility] ENDOSCOPY;  Service: Pulmonary;;   BRONCHIAL BRUSHINGS  05/11/2022   Procedure: BRONCHIAL BRUSHINGS;  Surgeon: Shelah Lamar RAMAN, MD;  Location: [facility] ENDOSCOPY;  Service: Pulmonary;;   BRONCHIAL NEEDLE ASPIRATION BIOPSY  05/11/2022   Procedure: BRONCHIAL NEEDLE ASPIRATION BIOPSIES;  Surgeon: Shelah Lamar RAMAN, MD;  Location: [facility] ENDOSCOPY;  Service: Pulmonary;;   BRONCHIAL WASHINGS  05/11/2022   Procedure: BRONCHIAL WASHINGS;  Surgeon: Shelah Lamar RAMAN, MD;  Location: [facility] ENDOSCOPY;  Service: Pulmonary;;   IR IMAGING GUIDED PORT INSERTION  05/25/2022   LAPAROTOMY N/A 08/08/2021   Procedure: EXPLORATORY LAPAROTOMY;  Surgeon: [name], MD;  Location: [facility] OR;  Service: General;  Laterality: N/A;   PARTIAL COLECTOMY N/A 08/08/2021   Procedure: PARTIAL COLECTOMY WITH COLOSTOMY;  Surgeon: [name], MD;  Location: [facility] OR;  Service: General;  Laterality: N/A;   VIDEO BRONCHOSCOPY WITH RADIAL ENDOBRONCHIAL ULTRASOUND  05/11/2022   Procedure: VIDEO BRONCHOSCOPY WITH RADIAL ENDOBRONCHIAL ULTRASOUND;  Surgeon: Shelah Lamar RAMAN, MD;  Location: [facility] ENDOSCOPY;  Service: Pulmonary;;    I have reviewed the social history and family history with the patient and they are unchanged from previous note.  ALLERGIES:  has no known allergies.  MEDICATIONS:  Current Outpatient Medications  Medication Sig Dispense Refill   acetaminophen  (TYLENOL ) 500 MG tablet Take 2 tablets (1,000 mg total) by mouth every 6 (six) hours as needed for mild pain or moderate pain.  0   apixaban  (ELIQUIS ) 5 MG TABS tablet Take 1 tablet (5 mg total) by mouth 2 (two) times daily. 60 tablet 2   aspirin EC 81 MG tablet Take 81 mg by mouth daily as needed (for pain or headaches).      benztropine  (COGENTIN ) 1 MG tablet Take 1 mg at bedtime by mouth.     clindamycin  (CLINDAGEL) 1 % gel Apply topically 2 (two) times daily. To skin rash on face and upper body 30 g 0   ferrous sulfate  325 (65 FE) MG EC tablet Take 1 tablet (325 mg total) by mouth daily. 30 tablet 3   hydrocortisone  cream 1 % Apply 1 Application topically 2 (two) times daily as needed for itching. For rash 30 g 2   loperamide  (IMODIUM ) 2 MG capsule Take 1 capsule (2 mg total) by mouth as needed for diarrhea or loose stools. 30 capsule 0   magnesium  oxide (MAG-OX) 400 (240 Mg) MG tablet Take 1 tablet (400 mg total) by mouth 2 (two) times daily. 30 tablet 2   Multiple Vitamin (MULTIVITAMIN WITH MINERALS) TABS tablet Take 1 tablet by mouth daily.  ondansetron  (ZOFRAN ) 8 MG tablet Take 1 tablet (8 mg total) by mouth every 8 (eight) hours as needed for nausea or vomiting. 30 tablet 0   paliperidone  (INVEGA  SUSTENNA) 156 MG/ML SUSP injection Inject 156 mg every 30 (thirty) days into the muscle.     polyethylene glycol (MIRALAX  / GLYCOLAX ) 17 g packet Take 17 g by  mouth daily as needed for mild constipation or moderate constipation.  0   potassium chloride  SA (KLOR-CON  M) 20 MEQ tablet Take 1 tablet (20 mEq total) by mouth daily. 30 tablet 2   prochlorperazine  (COMPAZINE ) 10 MG tablet TAKE 1 TABLET(10 MG) BY MOUTH EVERY 6 HOURS AS NEEDED FOR NAUSEA OR VOMITING 30 tablet 0   No current facility-administered medications for this visit.   Facility-Administered Medications Ordered in Other Visits  Medication Dose Route Frequency Provider Last Rate Last Admin   fluorouracil  (ADRUCIL ) 4,350 mg in sodium chloride  0.9 % 63 mL chemo infusion  2,400 mg/m2 (Treatment Plan Recorded) Intravenous 1 day or 1 dose [name], MD   Infusion Verify at 02/14/24 1502    PHYSICAL EXAMINATION: ECOG PERFORMANCE STATUS: 1 - Symptomatic but completely ambulatory  Vitals:   02/14/24 1046  BP: 94/60  Pulse: (!) 107  Resp: 17  Temp: 97.9 F (36.6 C)  SpO2: 99%   Wt Readings from Last 3 Encounters:  02/14/24 66 kg (145 lb 9.6 oz)  01/31/24 67 kg (147 lb 9.6 oz)  01/17/24 68.1 kg (150 lb 3.2 oz)     GENERAL:alert, no distress and comfortable SKIN: skin color, texture, turgor are normal, no rashes or significant lesions EYES: normal, Conjunctiva are pink and non-injected, sclera clear NECK: supple, thyroid normal size, non-tender, without nodularity LYMPH:  no palpable lymphadenopathy in the cervical, axillary  LUNGS: clear to auscultation and percussion with normal breathing effort HEART: regular rate & rhythm and no murmurs and no lower extremity edema ABDOMEN:abdomen soft, non-tender and normal bowel sounds Musculoskeletal:no cyanosis of digits and no clubbing  NEURO: alert & oriented x 3 with fluent speech, no focal motor/sensory deficits  Physical Exam   LABORATORY DATA:  I have reviewed the data as listed    Latest Ref Rng & Units 02/14/2024   10:26 AM 01/31/2024   10:25 AM 01/17/2024   10:33 AM  CBC  WBC 4.0 - 10.5 K/uL 4.6  3.6  3.6   Hemoglobin 13.0  - 17.0 g/dL 87.7  87.3  87.2   Hematocrit 39.0 - 52.0 % 36.3  37.3  37.6   Platelets 150 - 400 K/uL 295  301  302         Latest Ref Rng & Units 02/14/2024   10:26 AM 01/31/2024   10:25 AM 01/17/2024   10:33 AM  CMP  Glucose 70 - 99 mg/dL 94  870  96   BUN 6 - 20 mg/dL 9  6  10    Creatinine 0.61 - 1.24 mg/dL 9.29  9.27  9.29   Sodium 135 - 145 mmol/L 139  138  139   Potassium 3.5 - 5.1 mmol/L 3.7  3.3  3.6   Chloride 98 - 111 mmol/L 108  107  106   CO2 22 - 32 mmol/L 25  24  25    Calcium  8.9 - 10.3 mg/dL 9.1  8.9  9.2   Total Protein 6.5 - 8.1 g/dL 6.3  6.4  6.9   Total Bilirubin 0.0 - 1.2 mg/dL 0.7  1.0  0.9   Alkaline Phos 38 -  126 U/L 80  97  103   AST 15 - 41 U/L 12  14  15    ALT 0 - 44 U/L 8  9  14        RADIOGRAPHIC STUDIES: I have personally reviewed the radiological images as listed and agreed with the findings in the report. No results found.    Orders Placed This Encounter  Procedures   CBC with Differential (Cancer Center Only)    Standing Status:   Future    Expected Date:   04/10/2024    Expiration Date:   04/10/2025   CMP (Cancer Center only)    Standing Status:   Future    Expected Date:   04/10/2024    Expiration Date:   04/11/2025   Magnesium     Standing Status:   Future    Expected Date:   04/10/2024    Expiration Date:   04/10/2025   CBC with Differential (Cancer Center Only)    Standing Status:   Future    Expected Date:   04/24/2024    Expiration Date:   04/24/2025   CMP (Cancer Center only)    Standing Status:   Future    Expected Date:   04/24/2024    Expiration Date:   04/25/2025   Magnesium     Standing Status:   Future    Expected Date:   04/24/2024    Expiration Date:   04/24/2025   All questions were answered. The patient knows to call the clinic with any problems, questions or concerns. No barriers to learning was detected. The total time spent in the appointment was 25 minutes, including review of chart and various tests results, discussions about  plan of care and coordination of care plan     Onita Mattock, MD 02/14/2024

## 2024-02-14 NOTE — Patient Instructions (Signed)
 [facility] - A DEPT OF [place] [facility]   Discharge Instructions: Thank you for choosing [facility] to provide your oncology and hematology care.   If you have a lab appointment with the Cancer Center, please go directly to the Cancer Center and check in at the registration area.   Wear comfortable clothing and clothing appropriate for easy access to any Portacath or PICC line.   We strive to give you quality time with your provider. You may need to reschedule your appointment if you arrive late (15 or more minutes).  Arriving late affects you and other patients whose appointments are after yours.  Also, if you miss three or more appointments without notifying the office, you may be dismissed from the clinic at the provider's discretion.      For prescription refill requests, have your pharmacy contact our office and allow 72 hours for refills to be completed.    Today you received the following chemotherapy and/or immunotherapy agents: Panitumumab  (Vectibix ), Irinotecan , Leucovorin , Fluorouracil  (Adrucil )      To help prevent nausea and vomiting after your treatment, we encourage you to take your nausea medication as directed.  BELOW ARE SYMPTOMS THAT SHOULD BE REPORTED IMMEDIATELY: *FEVER GREATER THAN 100.4 F (38 C) OR HIGHER *CHILLS OR SWEATING *NAUSEA AND VOMITING THAT IS NOT CONTROLLED WITH YOUR NAUSEA MEDICATION *UNUSUAL SHORTNESS OF BREATH *UNUSUAL BRUISING OR BLEEDING *URINARY PROBLEMS (pain or burning when urinating, or frequent urination) *BOWEL PROBLEMS (unusual diarrhea, constipation, pain near the anus) TENDERNESS IN MOUTH AND THROAT WITH OR WITHOUT PRESENCE OF ULCERS (sore throat, sores in mouth, or a toothache) UNUSUAL RASH, SWELLING OR PAIN  UNUSUAL VAGINAL DISCHARGE OR ITCHING   Items with * indicate a potential emergency and should be followed up as soon as possible or go to the Emergency Department if any problems should  occur.  Please show the CHEMOTHERAPY ALERT CARD or IMMUNOTHERAPY ALERT CARD at check-in to the Emergency Department and triage nurse.  Should you have questions after your visit or need to cancel or reschedule your appointment, please contact [facility]  Dept: [phone]  and follow the prompts.  Office hours are 8:00 a.m. to 4:30 p.m. Monday - Friday. Please note that voicemails left after 4:00 p.m. may not be returned until the following business day.  We are closed weekends and major holidays. You have access to a nurse at all times for urgent questions. Please call the main number to the clinic Dept: [phone] and follow the prompts.   For any non-urgent questions, you may also contact your provider using MyChart. We now offer e-Visits for anyone 17 and older to request care online for non-urgent symptoms. For details visit [email or web address].   Also download the MyChart app! Go to the app store, search MyChart, open the app, select [facility], and log in with your MyChart username and password.  The chemotherapy medication bag should finish at 46 hours, 96 hours, or 7 days. For example, if your pump is scheduled for 46 hours and it was put on at 4:00 p.m., it should finish at 2:00 p.m. the day it is scheduled to come off regardless of your appointment time.     Estimated time to finish at   If the display on your pump reads Low Volume and it is beeping, take the batteries out of the pump and come to the  cancer center for it to be taken off.   If the pump alarms go off prior to the pump reading Low Volume then call [phone] and someone can assist you.  If the plunger comes out and the chemotherapy medication is leaking out, please use your home chemo spill kit to clean up the spill. Do NOT use paper towels or other household products.  If you have problems or questions regarding your pump, please call either  [phone] (24 hours a day) or the cancer center Monday-Friday 8:00 a.m.- 4:30 p.m. at the clinic number and we will assist you. If you are unable to get assistance, then go to the nearest Emergency Department and ask the staff to contact the IV team for assistance.

## 2024-02-15 ENCOUNTER — Other Ambulatory Visit: Payer: Self-pay

## 2024-02-16 ENCOUNTER — Inpatient Hospital Stay: Payer: MEDICAID

## 2024-02-16 VITALS — BP 91/65 | HR 79 | Temp 97.7°F | Resp 18

## 2024-02-16 DIAGNOSIS — C189 Malignant neoplasm of colon, unspecified: Secondary | ICD-10-CM

## 2024-02-16 DIAGNOSIS — Z95828 Presence of other vascular implants and grafts: Secondary | ICD-10-CM

## 2024-02-16 DIAGNOSIS — C186 Malignant neoplasm of descending colon: Secondary | ICD-10-CM

## 2024-02-16 DIAGNOSIS — Z5111 Encounter for antineoplastic chemotherapy: Secondary | ICD-10-CM | POA: Diagnosis not present

## 2024-02-16 MED ORDER — SODIUM CHLORIDE 0.9 % IV SOLN: INTRAVENOUS | Status: DC

## 2024-02-16 MED ORDER — HEPARIN SOD (PORK) LOCK FLUSH 100 UNIT/ML IV SOLN
500.0000 [IU] | Freq: Once | INTRAVENOUS | Status: AC | PRN
  Administered 2024-02-16: 500 [IU]

## 2024-02-16 MED ORDER — SODIUM CHLORIDE 0.9% FLUSH
10.0000 mL | INTRAVENOUS | Status: DC | PRN
  Administered 2024-02-16 (×2): 10 mL

## 2024-02-16 MED ORDER — MAGNESIUM SULFATE 2 GM/50ML IV SOLN
2.0000 g | Freq: Once | INTRAVENOUS | Status: AC
  Administered 2024-02-16: 2 g via INTRAVENOUS
  Filled 2024-02-16: qty 50

## 2024-02-16 NOTE — Patient Instructions (Signed)
 Hypomagnesemia Hypomagnesemia is a condition in which the level of magnesium in the blood is too low. Magnesium is a mineral that is found in many foods. It is used in many different processes in the body. Hypomagnesemia can affect every organ in the body. In severe cases, it can cause life-threatening problems. What are the causes? This condition may be caused by: Not getting enough magnesium in your diet or not having enough healthy foods to eat (malnutrition). Problems with magnesium absorption in the intestines. Dehydration. Excessive use of alcohol. Vomiting. Severe or long-term (chronic) diarrhea. Some medicines, including medicines that make you urinate more often (diuretics). Certain diseases, such as kidney disease, diabetes, celiac disease, and overactive thyroid. What are the signs or symptoms? Symptoms of this condition include: Loss of appetite, nausea, and vomiting. Involuntary shaking or trembling of a body part (tremor). Muscle weakness or tingling in the arms and legs. Sudden tightening of muscles (muscle spasms). Confusion. Psychiatric issues, such as: Depression and irritability. Psychosis. A feeling of fluttering of the heart (palpitations). Seizures. These symptoms are more severe if magnesium levels drop suddenly. How is this diagnosed? This condition may be diagnosed based on: Your symptoms and medical history. A physical exam. Blood and urine tests. How is this treated? Treatment depends on the cause and the severity of the condition. It may be treated by: Taking a magnesium supplement. This can be taken in pill form. If the condition is severe, magnesium is usually given through an IV. Making changes to your diet. You may be directed to eat foods that have a lot of magnesium, such as green leafy vegetables, peas, beans, and nuts. Not drinking alcohol. If you are struggling not to drink, ask your health care provider for help. Follow these instructions at  home: Eating and drinking     Make sure that your diet includes foods with magnesium. Foods that have a lot of magnesium in them include: Green leafy vegetables, such as spinach and broccoli. Beans and peas. Nuts and seeds, such as almonds and sunflower seeds. Whole grains, such as whole grain bread and fortified cereals. Drink fluids that contain salts and minerals (electrolytes), such as sports drinks, when you are active. Do not drink alcohol. General instructions Take over-the-counter and prescription medicines only as told by your health care provider. Take magnesium supplements as directed if your health care provider tells you to take them. Have your magnesium levels monitored as told by your health care provider. Keep all follow-up visits. This is important. Contact a health care provider if: You get worse instead of better. Your symptoms return. Get help right away if: You develop severe muscle weakness. You have trouble breathing. You feel that your heart is racing. These symptoms may represent a serious problem that is an emergency. Do not wait to see if the symptoms will go away. Get medical help right away. Call your local emergency services (911 in the [place] Do not drive yourself to the hospital. Summary Hypomagnesemia is a condition in which the level of magnesium in the blood is too low. Hypomagnesemia can affect every organ in the body. Treatment may include eating more foods that contain magnesium, taking magnesium supplements, and not drinking alcohol. Have your magnesium levels monitored as told by your health care provider. This information is not intended to replace advice given to you by your health care provider. Make sure you discuss any questions you have with your health care provider. Document Revised: 12/17/2020 Document Reviewed: 12/17/2020 Elsevier Patient Education  2024 [employer]

## 2024-02-27 NOTE — Assessment & Plan Note (Signed)
 stage IIIB p(T3, N1aM0), MSS, KRAS/NRAS/BRAF wild type, lung and node metastasis in 04/2022  -Initially diagnosed in 08/2021 -he completed 4 cycles adjuvant CAPOX 10/10/21 - 12/25/21 (though he somehow was still taking Xeloda  through ~02/09/22 due to misunderstandings).  -He had a metastatic recurrence in the lungs and retroperitoneal adenopathy in September 2023 -he started FOLFIRI on 05/08/2022, Vectibix  added from cycle 2, tolerating well overall -restaging CT scan image from August 02, 2022 and 10/28/2022 both showed good partial response in pulmonary metastasis, no other new lesions. -we have changed his treatment to maintenance therapy with 5-fu and vectibix  in late March 2024 -He is tolerating treatment very well, will continue. -due to metastasis in both lungs and retroperitoneal lymph nodes, he is not a candidate for surgery or local therapy  -restaging CT 10/06/2023 showed stable disease overall except increased size of the pulmonary nodule in the lingula now measuring 7 mm Will continue current therapy. - Restaging CT scan on the 29th 2025 showed mild disease progression in the lung, I changed his minute therapy back to FOLFIRI and Vectibix  on 01/03/2024

## 2024-02-27 NOTE — Progress Notes (Unsigned)
 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  -He had a metastatic recurrence in the lungs and retroperitoneal adenopathy in September 2023 -he started FOLFIRI on 05/08/2022, Vectibix  added from cycle 2, tolerating well overall -restaging CT scan image from August 02, 2022 and 10/28/2022 both showed good partial response in pulmonary metastasis, no other new lesions. -we have changed his treatment to maintenance therapy with 5-fu and vectibix  in late March 2024 -He is tolerating treatment very well, will continue. -due to metastasis in both lungs and retroperitoneal lymph nodes, he is not a candidate for surgery or local therapy  -restaging CT 10/06/2023 showed stable disease overall except increased size of the pulmonary nodule in the lingula now measuring 7 mm Will continue current therapy. - Restaging CT scan on the 29th 2025 showed mild disease progression in the lung. His chemotherapy was changed back to FOLFIRI and Vectibix  on 01/03/2024.  -tolerating treatment very well with minimal and manageable side effects. Proceed with chemotherapy FOLFIRI and Vectibix  every 2 weeks.    Hypokalemia Potassium is 3.3 today.  Plan to administer 10 mEq potassium during chemotherapy today.  Ensure adequate hydration and nutrition pre and post chemotherapy treatment.  Historical hypomagnesemia Magnesium  is 1.7 today.  No requirement for additional magnesium  during  today's visit.  Continue to check with each visit and replace as indicated.  Plan Labs reviewed. -Mild and stable anemia with Hgb 12.2 and HCT 35.9. - Mild hypokalemia with K at 3.3.  Remainder of CMP unremarkable. --Plan to replace potassium with 10 mEq potassium during chemotherapy. - Magnesium  normal at 1.7. Patient reports overall tolerance of chemotherapy with FOLFIRI and Vectibix  well. Labs and patient presentation are appropriate for treatment today.  Will proceed. Labs/flush, follow-up, and chemotherapy with FOLFIRI and Vectibix  in 2 weeks as scheduled.  The patient understands the plans discussed today and is in agreement with them.  He knows to contact our office if he develops concerns prior to his next appointment.  I provided 25 minutes of face-to-face time during this encounter and > 50% was spent counseling as documented under my assessment and plan.    [name], NP  [facility] - A DEPT OF [name] [address] [place] [address] Dept: [phone] Dept Fax: [phone]   No orders of the defined types were placed in this encounter.     CHIEF COMPLAINT:  CC: Metastatic colon cancer  Current Treatment: FOLFIRI  and Vectibix   INTERVAL HISTORY:  Matthew [name] is here today for repeat clinical assessment.  He last saw Dr. Lanny on 02/14/2024. Reports doing very well. Denies neuropathy in fingers or feet. Denies cold sensitivity. He reports no new concerns or complaints. He denies chest pain, chest pressure, or shortness of breath. He denies headaches or visual disturbances. He denies abdominal pain, nausea, vomiting, or changes in bowel or bladder habits.  Specifically, he denies diarrhea or constipation. Denies dermatitis or problems with his  skin. He denies fevers or chills. He denies pain. His appetite is good. His weight has decreased 2 pounds over last 2 weeks.  I have reviewed the past  medical history, past surgical history, social history and family history with the patient and they are unchanged from previous note.  ALLERGIES:  has no known allergies.  MEDICATIONS:  Current Outpatient Medications  Medication Sig Dispense Refill   acetaminophen  (TYLENOL ) 500 MG tablet Take 2 tablets (1,000 mg total) by mouth every 6 (six) hours as needed for mild pain or moderate pain.  0   apixaban  (ELIQUIS ) 5 MG TABS tablet Take 1 tablet (5 mg total) by mouth 2 (two) times daily. 60 tablet 2   aspirin EC 81 MG tablet Take 81 mg by mouth daily as needed (for pain or headaches).      benztropine  (COGENTIN ) 1 MG tablet Take 1 mg at bedtime by mouth.     clindamycin  (CLINDAGEL) 1 % gel Apply topically 2 (two) times daily. To skin rash on face and upper body 30 g 0   ferrous sulfate  325 (65 FE) MG EC tablet Take 1 tablet (325 mg total) by mouth daily. 30 tablet 3   hydrocortisone  cream 1 % Apply 1 Application topically 2 (two) times daily as needed for itching. For rash 30 g 2   loperamide  (IMODIUM ) 2 MG capsule Take 1 capsule (2 mg total) by mouth as needed for diarrhea or loose stools. 30 capsule 0   magnesium  oxide (MAG-OX) 400 (240 Mg) MG tablet Take 1 tablet (400 mg total) by mouth 2 (two) times daily. 30 tablet 2   Multiple Vitamin (MULTIVITAMIN WITH MINERALS) TABS tablet Take 1 tablet by mouth daily.     ondansetron  (ZOFRAN ) 8 MG tablet Take 1 tablet (8 mg total) by mouth every 8 (eight) hours as needed for nausea or vomiting. 30 tablet 0   paliperidone  (INVEGA  SUSTENNA) 156 MG/ML SUSP injection Inject 156 mg every 30 (thirty) days into the muscle.     polyethylene glycol (MIRALAX  / GLYCOLAX ) 17 g packet Take 17 g by mouth daily as needed for mild constipation or moderate constipation.  0   potassium chloride  SA (KLOR-CON  M) 20 MEQ tablet Take 1 tablet (20 mEq total) by mouth daily. 30 tablet 2   prochlorperazine  (COMPAZINE ) 10 MG tablet TAKE 1 TABLET(10 MG) BY MOUTH EVERY 6 HOURS AS  NEEDED FOR NAUSEA OR VOMITING 30 tablet 0   No current facility-administered medications for this visit.   Facility-Administered Medications Ordered in Other Visits  Medication Dose Route Frequency Provider Last Rate Last Admin   atropine  injection 0.5 mg  0.5 mg Intravenous Once PRN [name], MD       dexamethasone  (DECADRON ) injection 10 mg  10 mg Intravenous Once [name], MD       fluorouracil  (ADRUCIL ) 4,350 mg in sodium chloride  0.9 % 63 mL chemo infusion  2,400 mg/m2 (Treatment Plan Recorded) Intravenous 1 day or 1 dose [name], MD       irinotecan  (CAMPTOSAR ) 320 mg in sodium chloride  0.9 % 500 mL chemo infusion  180 mg/m2 (Treatment Plan Recorded) Intravenous Once [name], MD       leucovorin  728 mg in sodium chloride  0.9 % 250 mL infusion  400 mg/m2 (Treatment Plan Recorded) Intravenous Once [name], MD       palonosetron  (ALOXI ) injection 0.25 mg  0.25 mg Intravenous Once [name], MD       panitumumab  (VECTIBIX ) 400 mg in  sodium chloride  0.9 % 100 mL chemo infusion  6 mg/kg (Treatment Plan Recorded) Intravenous Once [name], MD        HISTORY OF PRESENT ILLNESS:   Oncology History Overview Note   Cancer Staging  Cancer of left colon [facility]) Staging form: Colon and Rectum, AJCC 8th Edition - Pathologic stage from 08/08/2021: Stage IIIB (pT3, pN1a, cM0) - Signed by [name], MD on 08/26/2021    Cancer of left colon (HCC)  04/01/2020 Imaging   IMPRESSION: 1. Gallbladder decompressed bowel also partially calcified gallstones. No pericholecystic inflammation though if there is persisting clinical concern for cholecystitis right upper quadrant ultrasound could be obtained. 2. Circumferential thickening of the distal thoracic esophagus. Could reflect features of esophagitis. Correlate with clinical symptoms and consider endoscopy as clinically warranted. 3. Additional segmental thickening of the mid to distal sigmoid with focal narrowing. No acute surrounding  inflammation or resulting obstruction. Findings are nonspecific, and could reflect sequela of prior inflammation/infection. However, recommend correlation with colonoscopy if not recently performed. 4. Mild circumferential bladder wall thickening and indentation of the bladder base by an enlarged prostate. Possibly sequela of chronic outlet obstruction though could correlate with urinalysis to exclude cystitis. 5. Aortic Atherosclerosis (ICD10-I70.0).   08/06/2021 Imaging   IMPRESSION: Sigmoid colonic perforation with small free intraperitoneal gas and infiltration of the mesenteric and omental fat in keeping with changes of peritonitis.   Long segment inflammatory stranding of the sigmoid colon in keeping with a severe infectious or inflammatory colitis. This terminates an area of irregular mural thickening, infiltrative soft tissue within the colonic mesentery, and focal dystrophic calcification. This may represent a chronic inflammatory process, however, a perforated malignancy could appear similarly. There are 2 separate points of perforation which again raise the question of an underlying malignancy.   7.1 cm gas and fluid containing pericolonic abscess within the sigmoid mesentery.   Marked inflammatory change of the terminal ileum adjacent to the pericolonic abscess with resultant small bowel obstruction. Fluid within the distal esophagus likely relates to gastroesophageal reflux the setting of vomiting.   Aortic Atherosclerosis (ICD10-I70.0).   08/08/2021 Cancer Staging   Staging form: Colon and Rectum, AJCC 8th Edition - Pathologic stage from 08/08/2021: Stage IIIB (pT3, pN1a, cM0) - Signed by [name], MD on 08/26/2021 Stage prefix: Initial diagnosis Total positive nodes: 1 Histologic grading system: 4 grade system Histologic grade (G): G2 Residual tumor (R): R0 - None   08/08/2021 Definitive Surgery   FINAL MICROSCOPIC DIAGNOSIS:   A. COLON, SIGMOID, PARTIAL COLECTOMY:  -  Invasive moderately differentiated adenocarcinoma.  - Metastatic carcinoma involving one of twelve lymph nodes (1/12).  - See oncology table below.   ADDENDUM:  Mismatch Repair Protein (IHC)  SUMMARY INTERPRETATION: NORMAL    08/14/2021 Imaging   EXAM: CT ABDOMEN AND PELVIS WITH CONTRAST  IMPRESSION: 1. Post recent sigmoid colectomy with left lower quadrant colostomy. Two small residual foci of air within the pelvic mesentery with mild adjacent thickening, but no abscess or drainable collection. Trace non organized free fluid and stranding in the pelvis. 2. Short segment of small bowel wall thickening and inflammation in the pelvis involving the distal ileum, likely reactive. 3. Dilated distal esophagus, stomach, and small bowel, without discrete transition point, favoring postoperative ileus. 4. Small bilateral pleural effusions and compressive atelectasis. 5. Heterogeneous partially enhancing 14 mm lymph node in the retroperitoneum at the aortoiliac bifurcation, not significantly changed from prior exam. Suspected additional lymph nodes in the anterior common iliac space, not significantly  changed from prior exam. Recommend attention at follow-up. 6. Additional chronic findings as described.     08/15/2021 Imaging   EXAM: CT CHEST WITH CONTRAST  IMPRESSION: 1. No evidence of thoracic metastasis. 2. Bilateral small layering pleural effusions with passive atelectasis   08/26/2021 Initial Diagnosis   Cancer of left colon (HCC)   10/10/2021 - 12/12/2021 Chemotherapy   Patient is on Treatment Plan : COLORECTAL Xelox ([place]ox) q21d     05/28/2022 - 05/28/2022 Chemotherapy   Patient is on Treatment Plan : COLORECTAL FOLFIRI + Bevacizumab q14d     05/28/2022 -  Chemotherapy   Patient is on Treatment Plan : COLORECTAL FOLFIRI + Panitumumab  q14d     07/31/2022 Imaging    IMPRESSION: Decreased bilateral pulmonary metastases.   Stable mild abdominal lymphadenopathy.   No new or  progressive metastatic disease within the chest, abdomen, or pelvis.   01/18/2023 Imaging    IMPRESSION: 1. Status post Hetty pouch sigmoid colon resection with left lower quadrant end colostomy and rectal stump. Unchanged, mild wall thickening of the decompressed distal colon at the ileostomy, as well as the rectal stump. 2. Unchanged small, treated metastatic pulmonary nodules. No new nodules. 3. Unchanged enlarged, coarsely calcified treated lower aortocaval metastatic lymph nodes. No new lymphadenopathy. 4. Coronary artery disease. 5. Cholelithiasis.   04/23/2023 Imaging   CT chest, abdomen, and pelvis with contrast  IMPRESSION: 1. Left lower lobe segmental pulmonary emboli, incidental finding. No evidence for right heart strain. 2. Stable trace bilateral pleural effusions. 3. Stable enlarged lower left paraesophageal lymph node. 4. Stable calcified retroperitoneal lymph nodes. No new enlarged lymph nodes in the chest, abdomen or pelvis. 5. Stable subcentimeter soft tissue nodules within the mesenteric fat along the inferior ostomy. 6. Left Bosniak I benign renal cyst measuring 4.6 cm. No follow-up imaging is recommended. JACR 2018 Feb; 264-273, Management of the Incidental Renal Mass on CT, [employer] 2021; 814-848, Bosniak Classification of Cystic Renal Masses, Version 2019.   07/15/2023 Imaging   CT chest abdomen and pelvis with contrast IMPRESSION: 1. Prior partial left hemicolectomy with end colostomy in the left anterior abdominal wall. Unchanged mild wall thickening of the decompressed distal colon at the ileostomy as well as the rectal stump/Hartmann's pouch. 2. Stable enlarged left lower paraesophageal lymph node. 3. Increased size of the treated pulmonary nodule in the lingula now measuring 6 mm previously 4 mm with increased in size favored artifactual given motion degrading examination of the lung bases. Suggest attention on follow-up imaging 4. Unchanged size of  the treated pulmonary nodule in the right middle lobe measuring 4 mm. 5. Increased size of prominent bilateral axillary lymph nodes measuring up to 7 mm in short axis, nonspecific. Suggest attention on follow-up imaging 6. No convincing evidence of new or progressive disease in the chest, abdomen or pelvis. 7.  Aortic Atherosclerosis (ICD10-I70.0).     10/06/2023 Imaging   CT chest abdomen pelvis with contrast IMPRESSION: 1. Increased size of the pulmonary nodule in the lingula now measuring 7 mm, concerning for pulmonary metastatic disease recurrence. 2. Additional bilateral pulmonary nodules are stable. 3. Stable prominent bilateral axillary, low paraesophageal and retroperitoneal lymph nodes. 4. Prior partial left hemicolectomy with Hartmann's pouch formation and left anterior abdominal wall colostomy. No suspicious nodularity along the Hartmann's suture line. 5. Patulous esophagus with symmetric distal esophageal wall thickening, similar prior. 6. Trace right pleural effusion. 7.  Aortic Atherosclerosis (ICD10-I70.0).     12/30/2023 Imaging   CT chest, abdomen, and pelvis with  contrast IMPRESSION: 1. Increased size of bilateral pulmonary nodules with new 4 mm left upper lobe and 5 mm superior segment right lower lobe pulmonary nodules, compatible with worsening pulmonary metastatic disease. 2. Stable prominent bilateral axillary and mediastinal lymph nodes. 3. Stable coarsely calcified retroperitoneal lymph nodes measuring up to 10 mm in short axis. No new pathologically enlarged abdominal or pelvic lymph nodes. 4. Stable trace right pleural effusion. 5. Prior partial left hemicolectomy with Hartmann's pouch formation and left anterior abdominal wall colostomy. No new suspicious nodularity along the suture line.       REVIEW OF SYSTEMS:   Constitutional: Denies fevers, chills or abnormal weight loss Eyes: Denies blurriness of vision Ears, nose, mouth, throat, and face: Denies  mucositis or sore throat Respiratory: Denies cough, dyspnea or wheezes Cardiovascular: Denies palpitation, chest discomfort or lower extremity swelling Gastrointestinal:  Denies nausea, heartburn or change in bowel habits Skin: Denies abnormal skin rashes Lymphatics: Denies new lymphadenopathy or easy bruising Neurological:Denies numbness, tingling or new weaknesses Behavioral/Psych: Mood is stable, no new changes  All other systems were reviewed with the patient and are negative.   VITALS:   Today's Vitals   02/28/24 1053  BP: 102/64  Pulse: (!) 102  Resp: 17  Temp: 97.7 F (36.5 C)  SpO2: 99%  Weight: 143 lb 4.8 oz (65 kg)   Body mass index is 21.16 kg/m.   Wt Readings from Last 3 Encounters:  02/28/24 143 lb 4.8 oz (65 kg)  02/14/24 145 lb 9.6 oz (66 kg)  01/31/24 147 lb 9.6 oz (67 kg)    Body mass index is 21.16 kg/m.  Performance status (ECOG): 1 - Symptomatic but completely ambulatory  PHYSICAL EXAM:   GENERAL:alert, no distress and comfortable SKIN: skin color, texture, turgor are normal, no rashes or significant lesions EYES: normal, Conjunctiva are pink and non-injected, sclera clear OROPHARYNX:no exudate, no erythema and lips, buccal mucosa, and tongue normal  NECK: supple, thyroid normal size, non-tender, without nodularity LYMPH:  no palpable lymphadenopathy in the cervical, axillary or inguinal LUNGS: clear to auscultation and percussion with normal breathing effort HEART: regular rate & rhythm and no murmurs and no lower extremity edema ABDOMEN:abdomen soft, non-tender and normal bowel sounds Musculoskeletal:no cyanosis of digits and no clubbing  NEURO: alert & oriented x 3 with fluent speech, no focal motor/sensory deficits  LABORATORY DATA:  I have reviewed the data as listed    Component Value Date/Time   NA 138 02/28/2024 1029   K 3.3 (L) 02/28/2024 1029   CL 106 02/28/2024 1029   CO2 25 02/28/2024 1029   GLUCOSE 104 (H) 02/28/2024 1029    BUN 6 02/28/2024 1029   CREATININE 0.76 02/28/2024 1029   CALCIUM  8.9 02/28/2024 1029   PROT 6.5 02/28/2024 1029   ALBUMIN 3.7 02/28/2024 1029   AST 16 02/28/2024 1029   ALT 12 02/28/2024 1029   ALKPHOS 93 02/28/2024 1029   BILITOT 1.0 02/28/2024 1029   GFRNONAA >60 02/28/2024 1029   GFRAA >60 04/01/2020 1237     Lab Results  Component Value Date   WBC 4.5 02/28/2024   NEUTROABS 3.0 02/28/2024   HGB 12.2 (L) 02/28/2024   HCT 35.9 (L) 02/28/2024   MCV 86.7 02/28/2024   PLT 338 02/28/2024

## 2024-02-28 ENCOUNTER — Inpatient Hospital Stay: Payer: MEDICAID

## 2024-02-28 ENCOUNTER — Encounter: Payer: Self-pay | Admitting: Nurse Practitioner

## 2024-02-28 ENCOUNTER — Inpatient Hospital Stay (HOSPITAL_BASED_OUTPATIENT_CLINIC_OR_DEPARTMENT_OTHER): Payer: MEDICAID | Admitting: Nurse Practitioner

## 2024-02-28 VITALS — BP 102/64 | HR 102 | Temp 97.7°F | Resp 17 | Wt 143.3 lb

## 2024-02-28 VITALS — HR 96

## 2024-02-28 DIAGNOSIS — C186 Malignant neoplasm of descending colon: Secondary | ICD-10-CM | POA: Diagnosis not present

## 2024-02-28 DIAGNOSIS — C7800 Secondary malignant neoplasm of unspecified lung: Secondary | ICD-10-CM

## 2024-02-28 DIAGNOSIS — Z5111 Encounter for antineoplastic chemotherapy: Secondary | ICD-10-CM | POA: Diagnosis not present

## 2024-02-28 DIAGNOSIS — Z95828 Presence of other vascular implants and grafts: Secondary | ICD-10-CM

## 2024-02-28 LAB — CBC WITH DIFFERENTIAL (CANCER CENTER ONLY)
Abs Immature Granulocytes: 0.01 K/uL (ref 0.00–0.07)
Basophils Absolute: 0 K/uL (ref 0.0–0.1)
Basophils Relative: 1 %
Eosinophils Absolute: 0.4 K/uL (ref 0.0–0.5)
Eosinophils Relative: 10 %
HCT: 35.9 % — ABNORMAL LOW (ref 39.0–52.0)
Hemoglobin: 12.2 g/dL — ABNORMAL LOW (ref 13.0–17.0)
Immature Granulocytes: 0 %
Lymphocytes Relative: 14 %
Lymphs Abs: 0.6 K/uL — ABNORMAL LOW (ref 0.7–4.0)
MCH: 29.5 pg (ref 26.0–34.0)
MCHC: 34 g/dL (ref 30.0–36.0)
MCV: 86.7 fL (ref 80.0–100.0)
Monocytes Absolute: 0.4 K/uL (ref 0.1–1.0)
Monocytes Relative: 9 %
Neutro Abs: 3 K/uL (ref 1.7–7.7)
Neutrophils Relative %: 66 %
Platelet Count: 338 K/uL (ref 150–400)
RBC: 4.14 MIL/uL — ABNORMAL LOW (ref 4.22–5.81)
RDW: 15 % (ref 11.5–15.5)
WBC Count: 4.5 K/uL (ref 4.0–10.5)
nRBC: 0 % (ref 0.0–0.2)

## 2024-02-28 LAB — CMP (CANCER CENTER ONLY)
ALT: 12 U/L (ref 0–44)
AST: 16 U/L (ref 15–41)
Albumin: 3.7 g/dL (ref 3.5–5.0)
Alkaline Phosphatase: 93 U/L (ref 38–126)
Anion gap: 7 (ref 5–15)
BUN: 6 mg/dL (ref 6–20)
CO2: 25 mmol/L (ref 22–32)
Calcium: 8.9 mg/dL (ref 8.9–10.3)
Chloride: 106 mmol/L (ref 98–111)
Creatinine: 0.76 mg/dL (ref 0.61–1.24)
GFR, Estimated: >60 mL/min (ref >60)
Glucose, Bld: 104 mg/dL — ABNORMAL HIGH (ref 70–99)
Potassium: 3.3 mmol/L — ABNORMAL LOW (ref 3.5–5.1)
Sodium: 138 mmol/L (ref 135–145)
Total Bilirubin: 1 mg/dL (ref 0.0–1.2)
Total Protein: 6.5 g/dL (ref 6.5–8.1)

## 2024-02-28 LAB — MAGNESIUM: Magnesium: 1.7 mg/dL (ref 1.7–2.4)

## 2024-02-28 MED ORDER — SODIUM CHLORIDE 0.9 % IV SOLN
6.0000 mg/kg | Freq: Once | INTRAVENOUS | Status: AC
  Administered 2024-02-28: 400 mg via INTRAVENOUS
  Filled 2024-02-28: qty 20

## 2024-02-28 MED ORDER — SODIUM CHLORIDE 0.9 % IV SOLN
180.0000 mg/m2 | Freq: Once | INTRAVENOUS | Status: AC
  Administered 2024-02-28: 320 mg via INTRAVENOUS
  Filled 2024-02-28: qty 15

## 2024-02-28 MED ORDER — PALONOSETRON HCL INJECTION 0.25 MG/5ML
0.2500 mg | Freq: Once | INTRAVENOUS | Status: AC
  Administered 2024-02-28: 0.25 mg via INTRAVENOUS
  Filled 2024-02-28: qty 5

## 2024-02-28 MED ORDER — ATROPINE SULFATE 1 MG/ML IV SOLN
0.5000 mg | Freq: Once | INTRAVENOUS | Status: AC
  Administered 2024-02-28: 0.5 mg via INTRAVENOUS
  Filled 2024-02-28: qty 1

## 2024-02-28 MED ORDER — DEXAMETHASONE SODIUM PHOSPHATE 10 MG/ML IJ SOLN
10.0000 mg | Freq: Once | INTRAMUSCULAR | Status: AC
  Administered 2024-02-28: 10 mg via INTRAVENOUS
  Filled 2024-02-28: qty 1

## 2024-02-28 MED ORDER — SODIUM CHLORIDE 0.9 % IV SOLN
2400.0000 mg/m2 | INTRAVENOUS | Status: DC
  Administered 2024-02-28: 4350 mg via INTRAVENOUS
  Filled 2024-02-28: qty 87

## 2024-02-28 MED ORDER — SODIUM CHLORIDE 0.9 % IV SOLN: Freq: Once | INTRAVENOUS | Status: AC

## 2024-02-28 MED ORDER — SODIUM CHLORIDE 0.9% FLUSH
10.0000 mL | Freq: Once | INTRAVENOUS | Status: AC
Start: 2024-02-28 — End: 2024-02-28
  Administered 2024-02-28: 10 mL

## 2024-02-28 MED ORDER — SODIUM CHLORIDE 0.9 % IV SOLN
400.0000 mg/m2 | Freq: Once | INTRAVENOUS | Status: AC
  Administered 2024-02-28: 728 mg via INTRAVENOUS
  Filled 2024-02-28: qty 25

## 2024-02-28 MED ORDER — SODIUM CHLORIDE 0.9 % IV SOLN: 6.0000 mg/kg | Freq: Once | INTRAVENOUS | Status: DC

## 2024-02-28 MED ORDER — POTASSIUM CHLORIDE 10 MEQ/100ML IV SOLN
10.0000 meq | Freq: Once | INTRAVENOUS | Status: AC
  Administered 2024-02-28: 10 meq via INTRAVENOUS
  Filled 2024-02-28: qty 100

## 2024-02-28 NOTE — Patient Instructions (Addendum)
 [facility] - A DEPT OF [facility]  Discharge Instructions: Thank you for choosing [facility] to provide your oncology and hematology care.   If you have a lab appointment with the Cancer Center, please go directly to the Cancer Center and check in at the registration area.   Wear comfortable clothing and clothing appropriate for easy access to any Portacath or PICC line.   We strive to give you quality time with your provider. You may need to reschedule your appointment if you arrive late (15 or more minutes).  Arriving late affects you and other patients whose appointments are after yours.  Also, if you miss three or more appointments without notifying the office, you may be dismissed from the clinic at the provider's discretion.      For prescription refill requests, have your pharmacy contact our office and allow 72 hours for refills to be completed.    Today you received the following chemotherapy and/or immunotherapy agents: Vectibix /Irinotecan /Leucovorin /Fluorouracil       To help prevent nausea and vomiting after your treatment, we encourage you to take your nausea medication as directed.  BELOW ARE SYMPTOMS THAT SHOULD BE REPORTED IMMEDIATELY: *FEVER GREATER THAN 100.4 F (38 C) OR HIGHER *CHILLS OR SWEATING *NAUSEA AND VOMITING THAT IS NOT CONTROLLED WITH YOUR NAUSEA MEDICATION *UNUSUAL SHORTNESS OF BREATH *UNUSUAL BRUISING OR BLEEDING *URINARY PROBLEMS (pain or burning when urinating, or frequent urination) *BOWEL PROBLEMS (unusual diarrhea, constipation, pain near the anus) TENDERNESS IN MOUTH AND THROAT WITH OR WITHOUT PRESENCE OF ULCERS (sore throat, sores in mouth, or a toothache) UNUSUAL RASH, SWELLING OR PAIN  UNUSUAL VAGINAL DISCHARGE OR ITCHING   Items with * indicate a potential emergency and should be followed up as soon as possible or go to the Emergency Department if any problems should occur.  Please show the  CHEMOTHERAPY ALERT CARD or IMMUNOTHERAPY ALERT CARD at check-in to the Emergency Department and triage nurse.  Should you have questions after your visit or need to cancel or reschedule your appointment, please contact [facility] - A DEPT OF [facility]  Dept: [phone]  and follow the prompts.  Office hours are 8:00 a.m. to 4:30 p.m. Monday - Friday. Please note that voicemails left after 4:00 p.m. may not be returned until the following business day.  We are closed weekends and major holidays. You have access to a nurse at all times for urgent questions. Please call the main number to the clinic Dept: [phone] and follow the prompts.   For any non-urgent questions, you may also contact your provider using MyChart. We now offer e-Visits for anyone 60 and older to request care online for non-urgent symptoms. For details visit [email or web address].   Also download the MyChart app! Go to the app store, search MyChart, open the app, select [facility], and log in with your MyChart username and password.  The chemotherapy medication bag should finish at 46 hours, 96 hours, or 7 days. For example, if your pump is scheduled for 46 hours and it was put on at 4:00 p.m., it should finish at 2:00 p.m. the day it is scheduled to come off regardless of your appointment time.     Estimated time to finish at approximately 2:00 PM on 03/01/24.   If the display on your pump reads Low Volume and it is beeping, take the batteries out of the pump and come to the cancer  center for it to be taken off.   If the pump alarms go off prior to the pump reading Low Volume then call [phone] and someone can assist you.  If the plunger comes out and the chemotherapy medication is leaking out, please use your home chemo spill kit to clean up the spill. Do NOT use paper towels or other household products.  If you have problems or questions regarding your pump, please call  either [phone] (24 hours a day) or the cancer center Monday-Friday 8:00 a.m.- 4:30 p.m. at the clinic number and we will assist you. If you are unable to get assistance, then go to the nearest Emergency Department and ask the staff to contact the IV team for assistance.

## 2024-03-01 ENCOUNTER — Inpatient Hospital Stay: Payer: MEDICAID

## 2024-03-01 VITALS — BP 93/77 | HR 96 | Temp 99.0°F | Resp 17

## 2024-03-01 DIAGNOSIS — C186 Malignant neoplasm of descending colon: Secondary | ICD-10-CM

## 2024-03-01 DIAGNOSIS — Z5111 Encounter for antineoplastic chemotherapy: Secondary | ICD-10-CM | POA: Diagnosis not present

## 2024-03-01 MED ORDER — SODIUM CHLORIDE 0.9% FLUSH
10.0000 mL | INTRAVENOUS | Status: DC | PRN
  Administered 2024-03-01: 10 mL

## 2024-03-01 MED ORDER — HEPARIN SOD (PORK) LOCK FLUSH 100 UNIT/ML IV SOLN
500.0000 [IU] | Freq: Once | INTRAVENOUS | Status: AC | PRN
  Administered 2024-03-01: 500 [IU]

## 2024-03-01 NOTE — Addendum Note (Signed)
 Addended by: TAMELA RAISIN R on: 03/01/2024 04:14 PM   Modules accepted: Orders

## 2024-03-12 NOTE — Assessment & Plan Note (Signed)
 stage IIIB p(T3, N1aM0), MSS, KRAS/NRAS/BRAF wild type, lung and node metastasis in 04/2022  -Initially diagnosed in 08/2021 -he completed 4 cycles adjuvant CAPOX 10/10/21 - 12/25/21 (though he somehow was still taking Xeloda  through ~02/09/22 due to misunderstandings).  -He had a metastatic recurrence in the lungs and retroperitoneal adenopathy in September 2023 -he started FOLFIRI on 05/08/2022, Vectibix  added from cycle 2, tolerating well overall -restaging CT scan image from August 02, 2022 and 10/28/2022 both showed good partial response in pulmonary metastasis, no other new lesions. -we have changed his treatment to maintenance therapy with 5-fu and vectibix  in late March 2024 -He is tolerating treatment very well, will continue. -due to metastasis in both lungs and retroperitoneal lymph nodes, he is not a candidate for surgery or local therapy  -restaging CT 10/06/2023 showed stable disease overall except increased size of the pulmonary nodule in the lingula now measuring 7 mm Will continue current therapy. - Restaging CT scan on May 29th 2025 showed mild disease progression in the lung, I changed his minute therapy back to FOLFIRI and Vectibix  on 01/03/2024

## 2024-03-13 ENCOUNTER — Inpatient Hospital Stay: Payer: MEDICAID | Attending: Hematology

## 2024-03-13 ENCOUNTER — Inpatient Hospital Stay (HOSPITAL_BASED_OUTPATIENT_CLINIC_OR_DEPARTMENT_OTHER): Payer: MEDICAID | Admitting: Hematology

## 2024-03-13 ENCOUNTER — Inpatient Hospital Stay: Payer: MEDICAID

## 2024-03-13 VITALS — HR 92

## 2024-03-13 VITALS — BP 102/71 | HR 101 | Temp 97.6°F | Resp 15 | Ht 69.0 in | Wt 143.0 lb

## 2024-03-13 DIAGNOSIS — Z79899 Other long term (current) drug therapy: Secondary | ICD-10-CM | POA: Diagnosis not present

## 2024-03-13 DIAGNOSIS — C7800 Secondary malignant neoplasm of unspecified lung: Secondary | ICD-10-CM

## 2024-03-13 DIAGNOSIS — Z5112 Encounter for antineoplastic immunotherapy: Secondary | ICD-10-CM | POA: Insufficient documentation

## 2024-03-13 DIAGNOSIS — C7801 Secondary malignant neoplasm of right lung: Secondary | ICD-10-CM | POA: Insufficient documentation

## 2024-03-13 DIAGNOSIS — Z79634 Long term (current) use of topoisomerase inhibitor: Secondary | ICD-10-CM | POA: Insufficient documentation

## 2024-03-13 DIAGNOSIS — Z5111 Encounter for antineoplastic chemotherapy: Secondary | ICD-10-CM | POA: Insufficient documentation

## 2024-03-13 DIAGNOSIS — C772 Secondary and unspecified malignant neoplasm of intra-abdominal lymph nodes: Secondary | ICD-10-CM | POA: Insufficient documentation

## 2024-03-13 DIAGNOSIS — D500 Iron deficiency anemia secondary to blood loss (chronic): Secondary | ICD-10-CM

## 2024-03-13 DIAGNOSIS — C186 Malignant neoplasm of descending colon: Secondary | ICD-10-CM

## 2024-03-13 DIAGNOSIS — C189 Malignant neoplasm of colon, unspecified: Secondary | ICD-10-CM

## 2024-03-13 DIAGNOSIS — Z79631 Long term (current) use of antimetabolite agent: Secondary | ICD-10-CM | POA: Diagnosis not present

## 2024-03-13 DIAGNOSIS — Z95828 Presence of other vascular implants and grafts: Secondary | ICD-10-CM

## 2024-03-13 DIAGNOSIS — C7802 Secondary malignant neoplasm of left lung: Secondary | ICD-10-CM | POA: Insufficient documentation

## 2024-03-13 LAB — CBC WITH DIFFERENTIAL (CANCER CENTER ONLY)
Abs Immature Granulocytes: 0.01 K/uL (ref 0.00–0.07)
Basophils Absolute: 0 K/uL (ref 0.0–0.1)
Basophils Relative: 1 %
Eosinophils Absolute: 0.2 K/uL (ref 0.0–0.5)
Eosinophils Relative: 6 %
HCT: 35.5 % — ABNORMAL LOW (ref 39.0–52.0)
Hemoglobin: 12 g/dL — ABNORMAL LOW (ref 13.0–17.0)
Immature Granulocytes: 0 %
Lymphocytes Relative: 25 %
Lymphs Abs: 0.9 K/uL (ref 0.7–4.0)
MCH: 29.1 pg (ref 26.0–34.0)
MCHC: 33.8 g/dL (ref 30.0–36.0)
MCV: 86 fL (ref 80.0–100.0)
Monocytes Absolute: 0.4 K/uL (ref 0.1–1.0)
Monocytes Relative: 11 %
Neutro Abs: 1.9 K/uL (ref 1.7–7.7)
Neutrophils Relative %: 57 %
Platelet Count: 334 K/uL (ref 150–400)
RBC: 4.13 MIL/uL — ABNORMAL LOW (ref 4.22–5.81)
RDW: 15.2 % (ref 11.5–15.5)
WBC Count: 3.5 K/uL — ABNORMAL LOW (ref 4.0–10.5)
nRBC: 0 % (ref 0.0–0.2)

## 2024-03-13 LAB — CMP (CANCER CENTER ONLY)
ALT: 10 U/L (ref 0–44)
AST: 13 U/L — ABNORMAL LOW (ref 15–41)
Albumin: 3.9 g/dL (ref 3.5–5.0)
Alkaline Phosphatase: 79 U/L (ref 38–126)
Anion gap: 6 (ref 5–15)
BUN: 8 mg/dL (ref 6–20)
CO2: 25 mmol/L (ref 22–32)
Calcium: 8.9 mg/dL (ref 8.9–10.3)
Chloride: 107 mmol/L (ref 98–111)
Creatinine: 0.73 mg/dL (ref 0.61–1.24)
GFR, Estimated: >60 mL/min (ref >60)
Glucose, Bld: 92 mg/dL (ref 70–99)
Potassium: 3.4 mmol/L — ABNORMAL LOW (ref 3.5–5.1)
Sodium: 138 mmol/L (ref 135–145)
Total Bilirubin: 0.5 mg/dL (ref 0.0–1.2)
Total Protein: 6.3 g/dL — ABNORMAL LOW (ref 6.5–8.1)

## 2024-03-13 LAB — FERRITIN: Ferritin: 75 ng/mL (ref 24–336)

## 2024-03-13 LAB — MAGNESIUM: Magnesium: 1.5 mg/dL — ABNORMAL LOW (ref 1.7–2.4)

## 2024-03-13 MED ORDER — PALONOSETRON HCL INJECTION 0.25 MG/5ML
0.2500 mg | Freq: Once | INTRAVENOUS | Status: AC
  Administered 2024-03-13 (×2): 0.25 mg via INTRAVENOUS
  Filled 2024-03-13: qty 5

## 2024-03-13 MED ORDER — SODIUM CHLORIDE 0.9 % IV SOLN
180.0000 mg/m2 | Freq: Once | INTRAVENOUS | Status: AC
  Administered 2024-03-13 (×2): 320 mg via INTRAVENOUS
  Filled 2024-03-13: qty 15

## 2024-03-13 MED ORDER — DEXAMETHASONE SODIUM PHOSPHATE 10 MG/ML IJ SOLN
10.0000 mg | Freq: Once | INTRAMUSCULAR | Status: AC
  Administered 2024-03-13 (×2): 10 mg via INTRAVENOUS
  Filled 2024-03-13: qty 1

## 2024-03-13 MED ORDER — SODIUM CHLORIDE 0.9% FLUSH: 10.0000 mL | INTRAVENOUS | Status: DC | PRN

## 2024-03-13 MED ORDER — SODIUM CHLORIDE 0.9 % IV SOLN
2400.0000 mg/m2 | INTRAVENOUS | Status: DC
  Administered 2024-03-13 (×2): 4350 mg via INTRAVENOUS
  Filled 2024-03-13: qty 87

## 2024-03-13 MED ORDER — SODIUM CHLORIDE 0.9 % IV SOLN
400.0000 mg/m2 | Freq: Once | INTRAVENOUS | Status: AC
  Administered 2024-03-13 (×2): 728 mg via INTRAVENOUS
  Filled 2024-03-13: qty 25

## 2024-03-13 MED ORDER — SODIUM CHLORIDE 0.9 % IV SOLN: Freq: Once | INTRAVENOUS | Status: AC

## 2024-03-13 MED ORDER — SODIUM CHLORIDE 0.9% FLUSH
10.0000 mL | Freq: Once | INTRAVENOUS | Status: AC
Start: 2024-03-13 — End: 2024-03-13
  Administered 2024-03-13 (×2): 10 mL

## 2024-03-13 MED ORDER — MAGNESIUM SULFATE 2 GM/50ML IV SOLN
2.0000 g | Freq: Once | INTRAVENOUS | Status: AC
  Administered 2024-03-13 (×2): 2 g via INTRAVENOUS
  Filled 2024-03-13: qty 50

## 2024-03-13 MED ORDER — ATROPINE SULFATE 1 MG/ML IV SOLN
0.5000 mg | Freq: Once | INTRAVENOUS | Status: AC | PRN
  Administered 2024-03-13 (×2): 0.5 mg via INTRAVENOUS
  Filled 2024-03-13: qty 1

## 2024-03-13 MED ORDER — SODIUM CHLORIDE 0.9 % IV SOLN
6.0000 mg/kg | Freq: Once | INTRAVENOUS | Status: AC
  Administered 2024-03-13 (×2): 400 mg via INTRAVENOUS
  Filled 2024-03-13: qty 20

## 2024-03-13 NOTE — Progress Notes (Signed)
 Patient Mg 1.5 today. Patient normally takes oral magnesium  once a day. Dr. Lanny told patient to take the magnesium  supplement three times a day with food. Noted in paperwork and verbally explained to the patient.

## 2024-03-13 NOTE — Progress Notes (Signed)
 [facility]   [phone] [phone]   Clinic Follow up Note   Patient Care Team: Medicine, [facility] as PCP - General [name], MD as Consulting Physician (General Surgery) [name], MD as Consulting Physician (Oncology)  Date of Service:  03/13/2024  CHIEF COMPLAINT: f/u of metastatic colon cancer  CURRENT THERAPY:  FOLFIRI and Vectibix   Oncology History   Cancer of left colon (HCC) stage IIIB p(T3, N1aM0), MSS, KRAS/NRAS/BRAF wild type, lung and node metastasis in 04/2022  -Initially diagnosed in 08/2021 -he completed 4 cycles adjuvant CAPOX 10/10/21 - 12/25/21 (though he somehow was still taking Xeloda  through ~02/09/22 due to misunderstandings).  -He had a metastatic recurrence in the lungs and retroperitoneal adenopathy in September 2023 -he started FOLFIRI on 05/08/2022, Vectibix  added from cycle 2, tolerating well overall -restaging CT scan image from August 02, 2022 and 10/28/2022 both showed good partial response in pulmonary metastasis, no other new lesions. -we have changed his treatment to maintenance therapy with 5-fu and vectibix  in late March 2024 -He is tolerating treatment very well, will continue. -due to metastasis in both lungs and retroperitoneal lymph nodes, he is not a candidate for surgery or local therapy  -restaging CT 10/06/2023 showed stable disease overall except increased size of the pulmonary nodule in the lingula now measuring 7 mm Will continue current therapy. - Restaging CT scan on May 29th 2025 showed mild disease progression in the lung, I changed his minute therapy back to FOLFIRI and Vectibix  on 01/03/2024  Assessment & Plan Metastatic colon cancer Ongoing treatment with no new symptoms. Previous CT scan was at the end of May. - Order CT scan in the first week of September to assess treatment response - Ensure ability to drink contrast for CT scan  Chemotherapy-related fatigue Mild  fatigue post-chemotherapy, recovering well with no significant impact on daily activities.  Chemotherapy-induced xerosis (dry skin) Dry skin, particularly on the palms, without cracking. - Advise use of lotion to manage dry skin  Plan - Patient is tolerating chemotherapy well overall, labs are still pending, if adequate, will proceed to chemotherapy today. - Follow-up in 2 weeks before next cycle chemo - Restaging CT scan in 3 weeks    SUMMARY OF ONCOLOGIC HISTORY: Oncology History Overview Note   Cancer Staging  Cancer of left colon [facility]) Staging form: Colon and Rectum, AJCC 8th Edition - Pathologic stage from 08/08/2021: Stage IIIB (pT3, pN1a, cM0) - Signed by [name], MD on 08/26/2021    Cancer of left colon (HCC)  04/01/2020 Imaging   IMPRESSION: 1. Gallbladder decompressed bowel also partially calcified gallstones. No pericholecystic inflammation though if there is persisting clinical concern for cholecystitis right upper quadrant ultrasound could be obtained. 2. Circumferential thickening of the distal thoracic esophagus. Could reflect features of esophagitis. Correlate with clinical symptoms and consider endoscopy as clinically warranted. 3. Additional segmental thickening of the mid to distal sigmoid with focal narrowing. No acute surrounding inflammation or resulting obstruction. Findings are nonspecific, and could reflect sequela of prior inflammation/infection. However, recommend correlation with colonoscopy if not recently performed. 4. Mild circumferential bladder wall thickening and indentation of the bladder base by an enlarged prostate. Possibly sequela of chronic outlet obstruction though could correlate with urinalysis to exclude cystitis. 5. Aortic Atherosclerosis (ICD10-I70.0).   08/06/2021 Imaging   IMPRESSION: Sigmoid colonic perforation with small free intraperitoneal gas and infiltration of the mesenteric and omental fat in keeping with changes of  peritonitis.   Long  segment inflammatory stranding of the sigmoid colon in keeping with a severe infectious or inflammatory colitis. This terminates an area of irregular mural thickening, infiltrative soft tissue within the colonic mesentery, and focal dystrophic calcification. This may represent a chronic inflammatory process, however, a perforated malignancy could appear similarly. There are 2 separate points of perforation which again raise the question of an underlying malignancy.   7.1 cm gas and fluid containing pericolonic abscess within the sigmoid mesentery.   Marked inflammatory change of the terminal ileum adjacent to the pericolonic abscess with resultant small bowel obstruction. Fluid within the distal esophagus likely relates to gastroesophageal reflux the setting of vomiting.   Aortic Atherosclerosis (ICD10-I70.0).   08/08/2021 Cancer Staging   Staging form: Colon and Rectum, AJCC 8th Edition - Pathologic stage from 08/08/2021: Stage IIIB (pT3, pN1a, cM0) - Signed by [name], MD on 08/26/2021 Stage prefix: Initial diagnosis Total positive nodes: 1 Histologic grading system: 4 grade system Histologic grade (G): G2 Residual tumor (R): R0 - None   08/08/2021 Definitive Surgery   FINAL MICROSCOPIC DIAGNOSIS:   A. COLON, SIGMOID, PARTIAL COLECTOMY:  - Invasive moderately differentiated adenocarcinoma.  - Metastatic carcinoma involving one of twelve lymph nodes (1/12).  - See oncology table below.   ADDENDUM:  Mismatch Repair Protein (IHC)  SUMMARY INTERPRETATION: NORMAL    08/14/2021 Imaging   EXAM: CT ABDOMEN AND PELVIS WITH CONTRAST  IMPRESSION: 1. Post recent sigmoid colectomy with left lower quadrant colostomy. Two small residual foci of air within the pelvic mesentery with mild adjacent thickening, but no abscess or drainable collection. Trace non organized free fluid and stranding in the pelvis. 2. Short segment of small bowel wall thickening and inflammation in the  pelvis involving the distal ileum, likely reactive. 3. Dilated distal esophagus, stomach, and small bowel, without discrete transition point, favoring postoperative ileus. 4. Small bilateral pleural effusions and compressive atelectasis. 5. Heterogeneous partially enhancing 14 mm lymph node in the retroperitoneum at the aortoiliac bifurcation, not significantly changed from prior exam. Suspected additional lymph nodes in the anterior common iliac space, not significantly changed from prior exam. Recommend attention at follow-up. 6. Additional chronic findings as described.     08/15/2021 Imaging   EXAM: CT CHEST WITH CONTRAST  IMPRESSION: 1. No evidence of thoracic metastasis. 2. Bilateral small layering pleural effusions with passive atelectasis   08/26/2021 Initial Diagnosis   Cancer of left colon (HCC)   10/10/2021 - 12/12/2021 Chemotherapy   Patient is on Treatment Plan : COLORECTAL Xelox ([place]apeox) q21d     05/28/2022 - 05/28/2022 Chemotherapy   Patient is on Treatment Plan : COLORECTAL FOLFIRI + Bevacizumab q14d     05/28/2022 -  Chemotherapy   Patient is on Treatment Plan : COLORECTAL FOLFIRI + Panitumumab  q14d     07/31/2022 Imaging    IMPRESSION: Decreased bilateral pulmonary metastases.   Stable mild abdominal lymphadenopathy.   No new or progressive metastatic disease within the chest, abdomen, or pelvis.   01/18/2023 Imaging    IMPRESSION: 1. Status post Hetty pouch sigmoid colon resection with left lower quadrant end colostomy and rectal stump. Unchanged, mild wall thickening of the decompressed distal colon at the ileostomy, as well as the rectal stump. 2. Unchanged small, treated metastatic pulmonary nodules. No new nodules. 3. Unchanged enlarged, coarsely calcified treated lower aortocaval metastatic lymph nodes. No new lymphadenopathy. 4. Coronary artery disease. 5. Cholelithiasis.   04/23/2023 Imaging   CT chest, abdomen, and pelvis with contrast   IMPRESSION: 1. Left lower  lobe segmental pulmonary emboli, incidental finding. No evidence for right heart strain. 2. Stable trace bilateral pleural effusions. 3. Stable enlarged lower left paraesophageal lymph node. 4. Stable calcified retroperitoneal lymph nodes. No new enlarged lymph nodes in the chest, abdomen or pelvis. 5. Stable subcentimeter soft tissue nodules within the mesenteric fat along the inferior ostomy. 6. Left Bosniak I benign renal cyst measuring 4.6 cm. No follow-up imaging is recommended. JACR 2018 Feb; 264-273, Management of the Incidental Renal Mass on CT, [employer] 2021; 814-848, Bosniak Classification of Cystic Renal Masses, Version 2019.   07/15/2023 Imaging   CT chest abdomen and pelvis with contrast IMPRESSION: 1. Prior partial left hemicolectomy with end colostomy in the left anterior abdominal wall. Unchanged mild wall thickening of the decompressed distal colon at the ileostomy as well as the rectal stump/Hartmann's pouch. 2. Stable enlarged left lower paraesophageal lymph node. 3. Increased size of the treated pulmonary nodule in the lingula now measuring 6 mm previously 4 mm with increased in size favored artifactual given motion degrading examination of the lung bases. Suggest attention on follow-up imaging 4. Unchanged size of the treated pulmonary nodule in the right middle lobe measuring 4 mm. 5. Increased size of prominent bilateral axillary lymph nodes measuring up to 7 mm in short axis, nonspecific. Suggest attention on follow-up imaging 6. No convincing evidence of new or progressive disease in the chest, abdomen or pelvis. 7.  Aortic Atherosclerosis (ICD10-I70.0).     10/06/2023 Imaging   CT chest abdomen pelvis with contrast IMPRESSION: 1. Increased size of the pulmonary nodule in the lingula now measuring 7 mm, concerning for pulmonary metastatic disease recurrence. 2. Additional bilateral pulmonary nodules are stable. 3. Stable prominent  bilateral axillary, low paraesophageal and retroperitoneal lymph nodes. 4. Prior partial left hemicolectomy with Hartmann's pouch formation and left anterior abdominal wall colostomy. No suspicious nodularity along the Hartmann's suture line. 5. Patulous esophagus with symmetric distal esophageal wall thickening, similar prior. 6. Trace right pleural effusion. 7.  Aortic Atherosclerosis (ICD10-I70.0).     12/30/2023 Imaging   CT chest, abdomen, and pelvis with contrast IMPRESSION: 1. Increased size of bilateral pulmonary nodules with new 4 mm left upper lobe and 5 mm superior segment right lower lobe pulmonary nodules, compatible with worsening pulmonary metastatic disease. 2. Stable prominent bilateral axillary and mediastinal lymph nodes. 3. Stable coarsely calcified retroperitoneal lymph nodes measuring up to 10 mm in short axis. No new pathologically enlarged abdominal or pelvic lymph nodes. 4. Stable trace right pleural effusion. 5. Prior partial left hemicolectomy with Hartmann's pouch formation and left anterior abdominal wall colostomy. No new suspicious nodularity along the suture line.      Discussed the use of AI scribe software for clinical note transcription with the patient, who gave verbal consent to proceed.  History of Present Illness Matthew [name] is a 58 year old male with metastatic colon cancer who presents for follow-up. He is accompanied by Dorthea, a high school student interested in medicine.  He has no new symptoms since his last visit and denies pain or discomfort. His weight is stable at 143 pounds with a good appetite. Undergoing chemotherapy, he experiences mild fatigue for a couple of days post-treatment but recovers quickly. He denies diarrhea, nausea, stomach issues, rash, numbness, tingling, or neuropathy. A CT scan was completed at the end of May, and he is due for another soon. He is scheduled for chemotherapy pump disconnection on the 16th or 15th and another  appointment on the 25th. He uses  Imodium  for occasional diarrhea, which is effective, and has a medication refill to pick up.     All other systems were reviewed with the patient and are negative.  MEDICAL HISTORY:  Past Medical History:  Diagnosis Date   Cancer (HCC)    Schizophrenia [facility])     SURGICAL HISTORY: Past Surgical History:  Procedure Laterality Date   BRONCHIAL BIOPSY  05/11/2022   Procedure: BRONCHIAL BIOPSIES;  Surgeon: Shelah Lamar RAMAN, MD;  Location: [facility] ENDOSCOPY;  Service: Pulmonary;;   BRONCHIAL BRUSHINGS  05/11/2022   Procedure: BRONCHIAL BRUSHINGS;  Surgeon: Shelah Lamar RAMAN, MD;  Location: [facility] ENDOSCOPY;  Service: Pulmonary;;   BRONCHIAL NEEDLE ASPIRATION BIOPSY  05/11/2022   Procedure: BRONCHIAL NEEDLE ASPIRATION BIOPSIES;  Surgeon: Shelah Lamar RAMAN, MD;  Location: [facility] ENDOSCOPY;  Service: Pulmonary;;   BRONCHIAL WASHINGS  05/11/2022   Procedure: BRONCHIAL WASHINGS;  Surgeon: Shelah Lamar RAMAN, MD;  Location: [facility] ENDOSCOPY;  Service: Pulmonary;;   IR IMAGING GUIDED PORT INSERTION  05/25/2022   LAPAROTOMY N/A 08/08/2021   Procedure: EXPLORATORY LAPAROTOMY;  Surgeon: [name], MD;  Location: [facility] OR;  Service: General;  Laterality: N/A;   PARTIAL COLECTOMY N/A 08/08/2021   Procedure: PARTIAL COLECTOMY WITH COLOSTOMY;  Surgeon: [name], MD;  Location: [facility] OR;  Service: General;  Laterality: N/A;   VIDEO BRONCHOSCOPY WITH RADIAL ENDOBRONCHIAL ULTRASOUND  05/11/2022   Procedure: VIDEO BRONCHOSCOPY WITH RADIAL ENDOBRONCHIAL ULTRASOUND;  Surgeon: Shelah Lamar RAMAN, MD;  Location: [facility] ENDOSCOPY;  Service: Pulmonary;;    I have reviewed the social history and family history with the patient and they are unchanged from previous note.  ALLERGIES:  has no known allergies.  MEDICATIONS:  Current Outpatient Medications  Medication Sig Dispense Refill   acetaminophen  (TYLENOL ) 500 MG tablet Take 2 tablets (1,000 mg total) by mouth every 6 (six) hours as needed for mild pain  or moderate pain.  0   apixaban  (ELIQUIS ) 5 MG TABS tablet Take 1 tablet (5 mg total) by mouth 2 (two) times daily. 60 tablet 2   aspirin EC 81 MG tablet Take 81 mg by mouth daily as needed (for pain or headaches).      benztropine  (COGENTIN ) 1 MG tablet Take 1 mg at bedtime by mouth.     clindamycin  (CLINDAGEL) 1 % gel Apply topically 2 (two) times daily. To skin rash on face and upper body 30 g 0   ferrous sulfate  325 (65 FE) MG EC tablet Take 1 tablet (325 mg total) by mouth daily. 30 tablet 3   hydrocortisone  cream 1 % Apply 1 Application topically 2 (two) times daily as needed for itching. For rash 30 g 2   loperamide  (IMODIUM ) 2 MG capsule Take 1 capsule (2 mg total) by mouth as needed for diarrhea or loose stools. 30 capsule 0   magnesium  oxide (MAG-OX) 400 (240 Mg) MG tablet Take 1 tablet (400 mg total) by mouth 2 (two) times daily. 30 tablet 2   Multiple Vitamin (MULTIVITAMIN WITH MINERALS) TABS tablet Take 1 tablet by mouth daily.     ondansetron  (ZOFRAN ) 8 MG tablet Take 1 tablet (8 mg total) by mouth every 8 (eight) hours as needed for nausea or vomiting. 30 tablet 0   paliperidone  (INVEGA  SUSTENNA) 156 MG/ML SUSP injection Inject 156 mg every 30 (thirty) days into the muscle.     polyethylene glycol (MIRALAX  / GLYCOLAX ) 17 g packet Take 17 g by mouth daily as needed for mild constipation or moderate constipation.  0  potassium chloride  SA (KLOR-CON  M) 20 MEQ tablet Take 1 tablet (20 mEq total) by mouth daily. 30 tablet 2   prochlorperazine  (COMPAZINE ) 10 MG tablet TAKE 1 TABLET(10 MG) BY MOUTH EVERY 6 HOURS AS NEEDED FOR NAUSEA OR VOMITING 30 tablet 0   No current facility-administered medications for this visit.    PHYSICAL EXAMINATION: ECOG PERFORMANCE STATUS: 1 - Symptomatic but completely ambulatory  Vitals:   03/13/24 1054  BP: 102/71  Pulse: (!) 101  Resp: 15  Temp: 97.6 F (36.4 C)  SpO2: 100%   Wt Readings from Last 3 Encounters:  03/13/24 143 lb (64.9 kg)   02/28/24 143 lb 4.8 oz (65 kg)  02/14/24 145 lb 9.6 oz (66 kg)     GENERAL:alert, no distress and comfortable SKIN: skin color, texture, turgor are normal, no rashes or significant lesions EYES: normal, Conjunctiva are pink and non-injected, sclera clear NECK: supple, thyroid normal size, non-tender, without nodularity LYMPH:  no palpable lymphadenopathy in the cervical, axillary  LUNGS: clear to auscultation and percussion with normal breathing effort HEART: regular rate & rhythm and no murmurs and no lower extremity edema ABDOMEN:abdomen soft, non-tender and normal bowel sounds Musculoskeletal:no cyanosis of digits and no clubbing  NEURO: alert & oriented x 3 with fluent speech, no focal motor/sensory deficits  Physical Exam MEASUREMENTS: Weight- 143. SKIN: Dry skin  LABORATORY DATA:  I have reviewed the data as listed    Latest Ref Rng & Units 02/28/2024   10:29 AM 02/14/2024   10:26 AM 01/31/2024   10:25 AM  CBC  WBC 4.0 - 10.5 K/uL 4.5  4.6  3.6   Hemoglobin 13.0 - 17.0 g/dL 87.7  87.7  87.3   Hematocrit 39.0 - 52.0 % 35.9  36.3  37.3   Platelets 150 - 400 K/uL 338  295  301         Latest Ref Rng & Units 02/28/2024   10:29 AM 02/14/2024   10:26 AM 01/31/2024   10:25 AM  CMP  Glucose 70 - 99 mg/dL 895  94  870   BUN 6 - 20 mg/dL 6  9  6    Creatinine 0.61 - 1.24 mg/dL 9.23  9.29  9.27   Sodium 135 - 145 mmol/L 138  139  138   Potassium 3.5 - 5.1 mmol/L 3.3  3.7  3.3   Chloride 98 - 111 mmol/L 106  108  107   CO2 22 - 32 mmol/L 25  25  24    Calcium  8.9 - 10.3 mg/dL 8.9  9.1  8.9   Total Protein 6.5 - 8.1 g/dL 6.5  6.3  6.4   Total Bilirubin 0.0 - 1.2 mg/dL 1.0  0.7  1.0   Alkaline Phos 38 - 126 U/L 93  80  97   AST 15 - 41 U/L 16  12  14    ALT 0 - 44 U/L 12  8  9        RADIOGRAPHIC STUDIES: I have personally reviewed the radiological images as listed and agreed with the findings in the report. No results found.    Orders Placed This Encounter  Procedures    CT CHEST ABDOMEN PELVIS W CONTRAST    Standing Status:   Future    Expected Date:   04/03/2024    Expiration Date:   03/13/2025    If indicated for the ordered procedure, I authorize the administration of contrast media per Radiology protocol:   Yes    Does the  patient have a contrast media/X-ray dye allergy?:   No    Preferred imaging location?:   [facility]    If indicated for the ordered procedure, I authorize the administration of oral contrast media per Radiology protocol:   Yes   All questions were answered. The patient knows to call the clinic with any problems, questions or concerns. No barriers to learning was detected. The total time spent in the appointment was 25 minutes, including review of chart and various tests results, discussions about plan of care and coordination of care plan     Onita Mattock, MD 03/13/2024

## 2024-03-13 NOTE — Patient Instructions (Signed)
 [facility] - A DEPT OF [facility]  Discharge Instructions:  Per Dr. Lanny- start taking your oral magesium three times a day with food.  Thank you for choosing [facility] to provide your oncology and hematology care.   If you have a lab appointment with the Cancer Center, please go directly to the Cancer Center and check in at the registration area.   Wear comfortable clothing and clothing appropriate for easy access to any Portacath or PICC line.   We strive to give you quality time with your provider. You may need to reschedule your appointment if you arrive late (15 or more minutes).  Arriving late affects you and other patients whose appointments are after yours.  Also, if you miss three or more appointments without notifying the office, you may be dismissed from the clinic at the provider's discretion.      For prescription refill requests, have your pharmacy contact our office and allow 72 hours for refills to be completed.    Today you received the following chemotherapy and/or immunotherapy agents: Vectibix /Irinotecan /Leucovorin /Fluorouracil       To help prevent nausea and vomiting after your treatment, we encourage you to take your nausea medication as directed.  BELOW ARE SYMPTOMS THAT SHOULD BE REPORTED IMMEDIATELY: *FEVER GREATER THAN 100.4 F (38 C) OR HIGHER *CHILLS OR SWEATING *NAUSEA AND VOMITING THAT IS NOT CONTROLLED WITH YOUR NAUSEA MEDICATION *UNUSUAL SHORTNESS OF BREATH *UNUSUAL BRUISING OR BLEEDING *URINARY PROBLEMS (pain or burning when urinating, or frequent urination) *BOWEL PROBLEMS (unusual diarrhea, constipation, pain near the anus) TENDERNESS IN MOUTH AND THROAT WITH OR WITHOUT PRESENCE OF ULCERS (sore throat, sores in mouth, or a toothache) UNUSUAL RASH, SWELLING OR PAIN  UNUSUAL VAGINAL DISCHARGE OR ITCHING   Items with * indicate a potential emergency and should be followed up as soon as possible or go to  the Emergency Department if any problems should occur.  Please show the CHEMOTHERAPY ALERT CARD or IMMUNOTHERAPY ALERT CARD at check-in to the Emergency Department and triage nurse.  Should you have questions after your visit or need to cancel or reschedule your appointment, please contact [facility] - A DEPT OF [facility]  Dept: [phone]  and follow the prompts.  Office hours are 8:00 a.m. to 4:30 p.m. Monday - Friday. Please note that voicemails left after 4:00 p.m. may not be returned until the following business day.  We are closed weekends and major holidays. You have access to a nurse at all times for urgent questions. Please call the main number to the clinic Dept: [phone] and follow the prompts.   For any non-urgent questions, you may also contact your provider using MyChart. We now offer e-Visits for anyone 32 and older to request care online for non-urgent symptoms. For details visit [email or web address].   Also download the MyChart app! Go to the app store, search MyChart, open the app, select [place], and log in with your MyChart username and password.  The chemotherapy medication bag should finish at 46 hours, 96 hours, or 7 days. For example, if your pump is scheduled for 46 hours and it was put on at 4:00 p.m., it should finish at 2:00 p.m. the day it is scheduled to come off regardless of your appointment time.     Estimated time to finish at approximately 2:00 PM on 03/01/24.   If the display on your pump reads Low Volume  and it is beeping, take the batteries out of the pump and come to the cancer center for it to be taken off.   If the pump alarms go off prior to the pump reading Low Volume then call [phone] and someone can assist you.  If the plunger comes out and the chemotherapy medication is leaking out, please use your home chemo spill kit to clean up the spill. Do NOT use paper towels or other household  products.  If you have problems or questions regarding your pump, please call either [phone] (24 hours a day) or the cancer center Monday-Friday 8:00 a.m.- 4:30 p.m. at the clinic number and we will assist you. If you are unable to get assistance, then go to the nearest Emergency Department and ask the staff to contact the IV team for assistance.

## 2024-03-15 ENCOUNTER — Other Ambulatory Visit: Payer: Self-pay | Admitting: Hematology

## 2024-03-15 ENCOUNTER — Inpatient Hospital Stay: Payer: MEDICAID

## 2024-03-15 VITALS — BP 94/68 | HR 115 | Temp 97.7°F | Resp 16

## 2024-03-15 DIAGNOSIS — C189 Malignant neoplasm of colon, unspecified: Secondary | ICD-10-CM

## 2024-03-15 DIAGNOSIS — C7800 Secondary malignant neoplasm of unspecified lung: Secondary | ICD-10-CM

## 2024-03-15 DIAGNOSIS — C186 Malignant neoplasm of descending colon: Secondary | ICD-10-CM

## 2024-03-15 DIAGNOSIS — Z95828 Presence of other vascular implants and grafts: Secondary | ICD-10-CM

## 2024-03-15 MED ORDER — SODIUM CHLORIDE 0.9% FLUSH
10.0000 mL | Freq: Once | INTRAVENOUS | Status: AC | PRN
Start: 2024-03-15 — End: 2024-03-15
  Administered 2024-03-15 (×2): 10 mL

## 2024-03-27 ENCOUNTER — Inpatient Hospital Stay (HOSPITAL_BASED_OUTPATIENT_CLINIC_OR_DEPARTMENT_OTHER): Payer: MEDICAID | Admitting: Hematology

## 2024-03-27 ENCOUNTER — Inpatient Hospital Stay: Payer: MEDICAID

## 2024-03-27 ENCOUNTER — Other Ambulatory Visit: Payer: Self-pay

## 2024-03-27 VITALS — BP 94/64 | HR 100 | Temp 98.7°F | Resp 19 | Ht 69.0 in | Wt 145.3 lb

## 2024-03-27 DIAGNOSIS — C186 Malignant neoplasm of descending colon: Secondary | ICD-10-CM

## 2024-03-27 DIAGNOSIS — C7800 Secondary malignant neoplasm of unspecified lung: Secondary | ICD-10-CM

## 2024-03-27 DIAGNOSIS — Z5111 Encounter for antineoplastic chemotherapy: Secondary | ICD-10-CM | POA: Diagnosis not present

## 2024-03-27 DIAGNOSIS — Z95828 Presence of other vascular implants and grafts: Secondary | ICD-10-CM

## 2024-03-27 LAB — CMP (CANCER CENTER ONLY)
ALT: 14 U/L (ref 0–44)
AST: 13 U/L — ABNORMAL LOW (ref 15–41)
Albumin: 3.9 g/dL (ref 3.5–5.0)
Alkaline Phosphatase: 107 U/L (ref 38–126)
Anion gap: 6 (ref 5–15)
BUN: 6 mg/dL (ref 6–20)
CO2: 25 mmol/L (ref 22–32)
Calcium: 8.9 mg/dL (ref 8.9–10.3)
Chloride: 106 mmol/L (ref 98–111)
Creatinine: 0.68 mg/dL (ref 0.61–1.24)
GFR, Estimated: >60 mL/min (ref >60)
Glucose, Bld: 100 mg/dL — ABNORMAL HIGH (ref 70–99)
Potassium: 3.6 mmol/L (ref 3.5–5.1)
Sodium: 137 mmol/L (ref 135–145)
Total Bilirubin: 0.5 mg/dL (ref 0.0–1.2)
Total Protein: 6.2 g/dL — ABNORMAL LOW (ref 6.5–8.1)

## 2024-03-27 LAB — CBC WITH DIFFERENTIAL (CANCER CENTER ONLY)
Abs Immature Granulocytes: 0.02 K/uL (ref 0.00–0.07)
Basophils Absolute: 0 K/uL (ref 0.0–0.1)
Basophils Relative: 1 %
Eosinophils Absolute: 0.2 K/uL (ref 0.0–0.5)
Eosinophils Relative: 3 %
HCT: 36.6 % — ABNORMAL LOW (ref 39.0–52.0)
Hemoglobin: 12 g/dL — ABNORMAL LOW (ref 13.0–17.0)
Immature Granulocytes: 0 %
Lymphocytes Relative: 17 %
Lymphs Abs: 0.8 K/uL (ref 0.7–4.0)
MCH: 28.4 pg (ref 26.0–34.0)
MCHC: 32.8 g/dL (ref 30.0–36.0)
MCV: 86.7 fL (ref 80.0–100.0)
Monocytes Absolute: 0.5 K/uL (ref 0.1–1.0)
Monocytes Relative: 10 %
Neutro Abs: 3.1 K/uL (ref 1.7–7.7)
Neutrophils Relative %: 69 %
Platelet Count: 356 K/uL (ref 150–400)
RBC: 4.22 MIL/uL (ref 4.22–5.81)
RDW: 15.7 % — ABNORMAL HIGH (ref 11.5–15.5)
WBC Count: 4.5 K/uL (ref 4.0–10.5)
nRBC: 0 % (ref 0.0–0.2)

## 2024-03-27 LAB — MAGNESIUM: Magnesium: 1.8 mg/dL (ref 1.7–2.4)

## 2024-03-27 MED ORDER — DEXAMETHASONE SODIUM PHOSPHATE 10 MG/ML IJ SOLN
10.0000 mg | Freq: Once | INTRAMUSCULAR | Status: AC
  Administered 2024-03-27: 10 mg via INTRAVENOUS
  Filled 2024-03-27: qty 1

## 2024-03-27 MED ORDER — SODIUM CHLORIDE 0.9% FLUSH
10.0000 mL | Freq: Once | INTRAVENOUS | Status: AC | PRN
  Administered 2024-03-27: 10 mL

## 2024-03-27 MED ORDER — SODIUM CHLORIDE 0.9 % IV SOLN
400.0000 mg/m2 | Freq: Once | INTRAVENOUS | Status: AC
  Administered 2024-03-27: 728 mg via INTRAVENOUS
  Filled 2024-03-27: qty 25

## 2024-03-27 MED ORDER — SODIUM CHLORIDE 0.9 % IV SOLN: Freq: Once | INTRAVENOUS | Status: AC

## 2024-03-27 MED ORDER — SODIUM CHLORIDE 0.9 % IV SOLN
6.0000 mg/kg | Freq: Once | INTRAVENOUS | Status: AC
  Administered 2024-03-27: 400 mg via INTRAVENOUS
  Filled 2024-03-27: qty 20

## 2024-03-27 MED ORDER — MAGNESIUM SULFATE 2 GM/50ML IV SOLN
2.0000 g | Freq: Once | INTRAVENOUS | Status: AC
  Administered 2024-03-27: 2 g via INTRAVENOUS
  Filled 2024-03-27: qty 50

## 2024-03-27 MED ORDER — SODIUM CHLORIDE 0.9 % IV SOLN
2400.0000 mg/m2 | INTRAVENOUS | Status: DC
  Administered 2024-03-27: 4350 mg via INTRAVENOUS
  Filled 2024-03-27: qty 87

## 2024-03-27 MED ORDER — SODIUM CHLORIDE 0.9 % IV SOLN
180.0000 mg/m2 | Freq: Once | INTRAVENOUS | Status: AC
  Administered 2024-03-27: 320 mg via INTRAVENOUS
  Filled 2024-03-27: qty 15

## 2024-03-27 MED ORDER — PALONOSETRON HCL INJECTION 0.25 MG/5ML
0.2500 mg | Freq: Once | INTRAVENOUS | Status: AC
  Administered 2024-03-27: 0.25 mg via INTRAVENOUS
  Filled 2024-03-27: qty 5

## 2024-03-27 MED ORDER — ATROPINE SULFATE 1 MG/ML IV SOLN
0.5000 mg | Freq: Once | INTRAVENOUS | Status: AC | PRN
  Administered 2024-03-27: 0.5 mg via INTRAVENOUS
  Filled 2024-03-27: qty 1

## 2024-03-27 NOTE — Progress Notes (Signed)
 Reduce Mg to 2g today per Powell Lessen, NP.  Bethel Gaglio, PharmD, MBA

## 2024-03-27 NOTE — Patient Instructions (Signed)
 [facility] - A DEPT OF [place] [facility]   Discharge Instructions: Thank you for choosing [facility] to provide your oncology and hematology care.   If you have a lab appointment with the Cancer Center, please go directly to the Cancer Center and check in at the registration area.   Wear comfortable clothing and clothing appropriate for easy access to any Portacath or PICC line.   We strive to give you quality time with your provider. You may need to reschedule your appointment if you arrive late (15 or more minutes).  Arriving late affects you and other patients whose appointments are after yours.  Also, if you miss three or more appointments without notifying the office, you may be dismissed from the clinic at the provider's discretion.      For prescription refill requests, have your pharmacy contact our office and allow 72 hours for refills to be completed.    Today you received the following chemotherapy and/or immunotherapy agents: Panitumumab  (Vectibix ), Irinotecan , Leucovorin , Fluorouracil  (Adrucil )      To help prevent nausea and vomiting after your treatment, we encourage you to take your nausea medication as directed.  BELOW ARE SYMPTOMS THAT SHOULD BE REPORTED IMMEDIATELY: *FEVER GREATER THAN 100.4 F (38 C) OR HIGHER *CHILLS OR SWEATING *NAUSEA AND VOMITING THAT IS NOT CONTROLLED WITH YOUR NAUSEA MEDICATION *UNUSUAL SHORTNESS OF BREATH *UNUSUAL BRUISING OR BLEEDING *URINARY PROBLEMS (pain or burning when urinating, or frequent urination) *BOWEL PROBLEMS (unusual diarrhea, constipation, pain near the anus) TENDERNESS IN MOUTH AND THROAT WITH OR WITHOUT PRESENCE OF ULCERS (sore throat, sores in mouth, or a toothache) UNUSUAL RASH, SWELLING OR PAIN  UNUSUAL VAGINAL DISCHARGE OR ITCHING   Items with * indicate a potential emergency and should be followed up as soon as possible or go to the Emergency Department if any problems should  occur.  Please show the CHEMOTHERAPY ALERT CARD or IMMUNOTHERAPY ALERT CARD at check-in to the Emergency Department and triage nurse.  Should you have questions after your visit or need to cancel or reschedule your appointment, please contact [facility]  Dept: [phone]  and follow the prompts.  Office hours are 8:00 a.m. to 4:30 p.m. Monday - Friday. Please note that voicemails left after 4:00 p.m. may not be returned until the following business day.  We are closed weekends and major holidays. You have access to a nurse at all times for urgent questions. Please call the main number to the clinic Dept: [phone] and follow the prompts.   For any non-urgent questions, you may also contact your provider using MyChart. We now offer e-Visits for anyone 17 and older to request care online for non-urgent symptoms. For details visit [email or web address].   Also download the MyChart app! Go to the app store, search MyChart, open the app, select [facility], and log in with your MyChart username and password.  The chemotherapy medication bag should finish at 46 hours, 96 hours, or 7 days. For example, if your pump is scheduled for 46 hours and it was put on at 4:00 p.m., it should finish at 2:00 p.m. the day it is scheduled to come off regardless of your appointment time.     Estimated time to finish at   If the display on your pump reads Low Volume and it is beeping, take the batteries out of the pump and come to the  cancer center for it to be taken off.   If the pump alarms go off prior to the pump reading Low Volume then call [phone] and someone can assist you.  If the plunger comes out and the chemotherapy medication is leaking out, please use your home chemo spill kit to clean up the spill. Do NOT use paper towels or other household products.  If you have problems or questions regarding your pump, please call either  [phone] (24 hours a day) or the cancer center Monday-Friday 8:00 a.m.- 4:30 p.m. at the clinic number and we will assist you. If you are unable to get assistance, then go to the nearest Emergency Department and ask the staff to contact the IV team for assistance.

## 2024-03-27 NOTE — Assessment & Plan Note (Signed)
 stage IIIB p(T3, N1aM0), MSS, KRAS/NRAS/BRAF wild type, lung and node metastasis in 04/2022  -Initially diagnosed in 08/2021 -he completed 4 cycles adjuvant CAPOX 10/10/21 - 12/25/21 (though he somehow was still taking Xeloda  through ~02/09/22 due to misunderstandings).  -He had a metastatic recurrence in the lungs and retroperitoneal adenopathy in September 2023 -he started FOLFIRI on 05/08/2022, Vectibix  added from cycle 2, tolerating well overall -restaging CT scan image from August 02, 2022 and 10/28/2022 both showed good partial response in pulmonary metastasis, no other new lesions. -we have changed his treatment to maintenance therapy with 5-fu and vectibix  in late March 2024 -He is tolerating treatment very well, will continue. -due to metastasis in both lungs and retroperitoneal lymph nodes, he is not a candidate for surgery or local therapy  -restaging CT 10/06/2023 showed stable disease overall except increased size of the pulmonary nodule in the lingula now measuring 7 mm Will continue current therapy. - Restaging CT scan on May 29th 2025 showed mild disease progression in the lung, I changed his minute therapy back to FOLFIRI and Vectibix  on 01/03/2024

## 2024-03-28 ENCOUNTER — Encounter: Payer: Self-pay | Admitting: Hematology

## 2024-03-28 ENCOUNTER — Encounter: Payer: Self-pay | Admitting: Nurse Practitioner

## 2024-03-28 NOTE — Progress Notes (Signed)
 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  -He had a metastatic recurrence in the lungs and retroperitoneal adenopathy in September 2023 -he started FOLFIRI on 05/08/2022, Vectibix  added from cycle 2, tolerating well overall -restaging CT scan image from August 02, 2022 and 10/28/2022 both showed good partial response in pulmonary metastasis, no other new lesions. -we have changed his treatment to maintenance therapy with 5-fu and vectibix  in late March 2024 -He is tolerating treatment very well, will continue. -due to metastasis in both lungs and retroperitoneal lymph nodes, he is not a candidate for surgery or local therapy  -restaging CT 10/06/2023 showed stable disease overall except increased size of the pulmonary nodule in the lingula now measuring 7 mm Will continue current therapy. - Restaging CT scan on May 29th 2025 showed mild disease progression in the lung, I changed his minute therapy back to FOLFIRI and Vectibix  on 01/03/2024  Assessment & Plan Metastatic colon cancer Metastatic colon cancer is being monitored. Weight gain of two pounds noted. Blood counts are adequate for chemotherapy. Awaiting CT scan results to evaluate cancer status and potential treatment modifications. - Continue current chemotherapy  regimen as blood counts are adequate. - Await CT scan results scheduled for next week to assess cancer status and determine if treatment changes are needed.  Hypomagnesemia Hypomagnesemia managed with oral magnesium  supplementation. Adherence to medication schedule is a concern. - Increase oral magnesium  intake to three times a day, ideally with each meal. - Ensure adherence to magnesium  supplementation by monitoring medication intake.  Plan - Lab reviewed, adequate for treatment, will proceed chemotherapy today - He is scheduled for restaging CT scan next week - Reduce IV magnesium  to 2 g, continue oral mag.   SUMMARY OF ONCOLOGIC HISTORY: Oncology History Overview Note   Cancer Staging  Cancer of left colon [facility]) Staging form: Colon and Rectum, AJCC 8th Edition - Pathologic stage from 08/08/2021: Stage IIIB (pT3, pN1a, cM0) - Signed by [name], MD on 08/26/2021    Cancer of left colon (HCC)  04/01/2020 Imaging   IMPRESSION: 1. Gallbladder decompressed bowel also partially calcified gallstones. No pericholecystic inflammation though if there is persisting clinical concern for cholecystitis right upper quadrant ultrasound could be obtained. 2. Circumferential thickening of the distal thoracic esophagus. Could reflect features of esophagitis. Correlate with clinical symptoms and consider endoscopy as clinically warranted. 3. Additional segmental thickening of the mid to distal sigmoid with focal narrowing. No acute surrounding inflammation or resulting obstruction. Findings are nonspecific, and could reflect sequela of prior inflammation/infection. However, recommend correlation with colonoscopy if not recently performed. 4. Mild circumferential bladder wall thickening and indentation of the bladder base by an enlarged prostate. Possibly sequela of chronic outlet obstruction though could correlate with urinalysis to exclude cystitis. 5. Aortic Atherosclerosis (ICD10-I70.0).    08/06/2021 Imaging   IMPRESSION: Sigmoid colonic perforation with small free  intraperitoneal gas and infiltration of the mesenteric and omental fat in keeping with changes of peritonitis.   Long segment inflammatory stranding of the sigmoid colon in keeping with a severe infectious or inflammatory colitis. This terminates an area of irregular mural thickening, infiltrative soft tissue within the colonic mesentery, and focal dystrophic calcification. This may represent a chronic inflammatory process, however, a perforated malignancy could appear similarly. There are 2 separate points of perforation which again raise the question of an underlying malignancy.   7.1 cm gas and fluid containing pericolonic abscess within the sigmoid mesentery.   Marked inflammatory change of the terminal ileum adjacent to the pericolonic abscess with resultant small bowel obstruction. Fluid within the distal esophagus likely relates to gastroesophageal reflux the setting of vomiting.   Aortic Atherosclerosis (ICD10-I70.0).   08/08/2021 Cancer Staging   Staging form: Colon and Rectum, AJCC 8th Edition - Pathologic stage from 08/08/2021: Stage IIIB (pT3, pN1a, cM0) - Signed by [name], MD on 08/26/2021 Stage prefix: Initial diagnosis Total positive nodes: 1 Histologic grading system: 4 grade system Histologic grade (G): G2 Residual tumor (R): R0 - None   08/08/2021 Definitive Surgery   FINAL MICROSCOPIC DIAGNOSIS:   A. COLON, SIGMOID, PARTIAL COLECTOMY:  - Invasive moderately differentiated adenocarcinoma.  - Metastatic carcinoma involving one of twelve lymph nodes (1/12).  - See oncology table below.   ADDENDUM:  Mismatch Repair Protein (IHC)  SUMMARY INTERPRETATION: NORMAL    08/14/2021 Imaging   EXAM: CT ABDOMEN AND PELVIS WITH CONTRAST  IMPRESSION: 1. Post recent sigmoid colectomy with left lower quadrant colostomy. Two small residual foci of air within the pelvic mesentery with mild adjacent  thickening, but no abscess or drainable collection. Trace non organized free fluid and stranding in the pelvis. 2. Short segment of small bowel wall thickening and inflammation in the pelvis involving the distal ileum, likely reactive. 3. Dilated distal esophagus, stomach, and small bowel, without discrete transition point, favoring postoperative ileus. 4. Small bilateral pleural effusions and compressive atelectasis. 5. Heterogeneous partially enhancing 14 mm lymph node in the retroperitoneum at the aortoiliac bifurcation, not significantly changed from prior exam. Suspected additional lymph nodes in the anterior common iliac space, not significantly changed from prior exam. Recommend attention at follow-up. 6. Additional chronic findings as described.     08/15/2021 Imaging   EXAM: CT CHEST WITH CONTRAST  IMPRESSION: 1. No evidence of thoracic metastasis. 2. Bilateral small layering pleural effusions with passive atelectasis   08/26/2021 Initial Diagnosis   Cancer of left colon (HCC)   10/10/2021 - 12/12/2021 Chemotherapy   Patient is on Treatment Plan : COLORECTAL Xelox ([place]ox) q21d     05/28/2022 - 05/28/2022 Chemotherapy   Patient is on Treatment Plan : COLORECTAL FOLFIRI + Bevacizumab q14d     05/28/2022 -  Chemotherapy   Patient is on Treatment Plan : COLORECTAL FOLFIRI + Panitumumab  q14d     07/31/2022 Imaging    IMPRESSION: Decreased bilateral pulmonary metastases.   Stable mild abdominal lymphadenopathy.   No new or progressive metastatic disease within the chest, abdomen, or pelvis.   01/18/2023 Imaging    IMPRESSION: 1. Status post Hetty pouch sigmoid colon resection with left lower quadrant end colostomy and rectal stump. Unchanged, mild wall thickening of the decompressed distal colon at the ileostomy, as well as the rectal stump. 2. Unchanged small, treated metastatic pulmonary nodules. No new nodules. 3. Unchanged enlarged, coarsely calcified  treated lower aortocaval metastatic lymph nodes. No new lymphadenopathy. 4. Coronary artery disease. 5.  Cholelithiasis.   04/23/2023 Imaging   CT chest, abdomen, and pelvis with contrast  IMPRESSION: 1. Left lower lobe segmental pulmonary emboli, incidental finding. No evidence for right heart strain. 2. Stable trace bilateral pleural effusions. 3. Stable enlarged lower left paraesophageal lymph node. 4. Stable calcified retroperitoneal lymph nodes. No new enlarged lymph nodes in the chest, abdomen or pelvis. 5. Stable subcentimeter soft tissue nodules within the mesenteric fat along the inferior ostomy. 6. Left Bosniak I benign renal cyst measuring 4.6 cm. No follow-up imaging is recommended. JACR 2018 Feb; 264-273, Management of the Incidental Renal Mass on CT, [employer] 2021; 814-848, Bosniak Classification of Cystic Renal Masses, Version 2019.   07/15/2023 Imaging   CT chest abdomen and pelvis with contrast IMPRESSION: 1. Prior partial left hemicolectomy with end colostomy in the left anterior abdominal wall. Unchanged mild wall thickening of the decompressed distal colon at the ileostomy as well as the rectal stump/Hartmann's pouch. 2. Stable enlarged left lower paraesophageal lymph node. 3. Increased size of the treated pulmonary nodule in the lingula now measuring 6 mm previously 4 mm with increased in size favored artifactual given motion degrading examination of the lung bases. Suggest attention on follow-up imaging 4. Unchanged size of the treated pulmonary nodule in the right middle lobe measuring 4 mm. 5. Increased size of prominent bilateral axillary lymph nodes measuring up to 7 mm in short axis, nonspecific. Suggest attention on follow-up imaging 6. No convincing evidence of new or progressive disease in the chest, abdomen or pelvis. 7.  Aortic Atherosclerosis (ICD10-I70.0).     10/06/2023 Imaging   CT chest abdomen pelvis with contrast IMPRESSION: 1. Increased size of  the pulmonary nodule in the lingula now measuring 7 mm, concerning for pulmonary metastatic disease recurrence. 2. Additional bilateral pulmonary nodules are stable. 3. Stable prominent bilateral axillary, low paraesophageal and retroperitoneal lymph nodes. 4. Prior partial left hemicolectomy with Hartmann's pouch formation and left anterior abdominal wall colostomy. No suspicious nodularity along the Hartmann's suture line. 5. Patulous esophagus with symmetric distal esophageal wall thickening, similar prior. 6. Trace right pleural effusion. 7.  Aortic Atherosclerosis (ICD10-I70.0).     12/30/2023 Imaging   CT chest, abdomen, and pelvis with contrast IMPRESSION: 1. Increased size of bilateral pulmonary nodules with new 4 mm left upper lobe and 5 mm superior segment right lower lobe pulmonary nodules, compatible with worsening pulmonary metastatic disease. 2. Stable prominent bilateral axillary and mediastinal lymph nodes. 3. Stable coarsely calcified retroperitoneal lymph nodes measuring up to 10 mm in short axis. No new pathologically enlarged abdominal or pelvic lymph nodes. 4. Stable trace right pleural effusion. 5. Prior partial left hemicolectomy with Hartmann's pouch formation and left anterior abdominal wall colostomy. No new suspicious nodularity along the suture line.      Discussed the use of AI scribe software for clinical note transcription with the patient, who gave verbal consent to proceed.  History of Present Illness Matthew [name] is a 58 year old male with metastatic colon cancer who presents for follow-up.  He has no new symptoms, pain, or gastrointestinal issues. His weight is stable with a gain of two pounds. He takes oral magnesium  twice daily with caregiver assistance to ensure regular medication intake. There are no issues with chemotherapy.     All other systems were reviewed with the patient and are negative.  MEDICAL HISTORY:  Past Medical History:   Diagnosis Date   Cancer (HCC)    Schizophrenia (HCC)     SURGICAL HISTORY: Past Surgical  History:  Procedure Laterality Date   BRONCHIAL BIOPSY  05/11/2022   Procedure: BRONCHIAL BIOPSIES;  Surgeon: Shelah Lamar RAMAN, MD;  Location: [facility] ENDOSCOPY;  Service: Pulmonary;;   BRONCHIAL BRUSHINGS  05/11/2022   Procedure: BRONCHIAL BRUSHINGS;  Surgeon: Shelah Lamar RAMAN, MD;  Location: [facility] ENDOSCOPY;  Service: Pulmonary;;   BRONCHIAL NEEDLE ASPIRATION BIOPSY  05/11/2022   Procedure: BRONCHIAL NEEDLE ASPIRATION BIOPSIES;  Surgeon: Shelah Lamar RAMAN, MD;  Location[facility] ENDOSCOPY;  Service: Pulmonary;;   BRONCHIAL WASHINGS  05/11/2022   Procedure: BRONCHIAL WASHINGS;  Surgeon: Shelah Lamar RAMAN, MD;  Location: [facility] ENDOSCOPY;  Service: Pulmonary;;   IR IMAGING GUIDED PORT INSERTION  05/25/2022   LAPAROTOMY N/A 08/08/2021   Procedure: EXPLORATORY LAPAROTOMY;  Surgeon: [name], MD;  Location: [facility] OR;  Service: General;  Laterality: N/A;   PARTIAL COLECTOMY N/A 08/08/2021   Procedure: PARTIAL COLECTOMY WITH COLOSTOMY;  Surgeon: [name], MD;  Location: [facility] OR;  Service: General;  Laterality: N/A;   VIDEO BRONCHOSCOPY WITH RADIAL ENDOBRONCHIAL ULTRASOUND  05/11/2022   Procedure: VIDEO BRONCHOSCOPY WITH RADIAL ENDOBRONCHIAL ULTRASOUND;  Surgeon: Shelah Lamar RAMAN, MD;  Location: [facility] ENDOSCOPY;  Service: Pulmonary;;    I have reviewed the social history and family history with the patient and they are unchanged from previous note.  ALLERGIES:  has no known allergies.  MEDICATIONS:  Current Outpatient Medications  Medication Sig Dispense Refill   acetaminophen  (TYLENOL ) 500 MG tablet Take 2 tablets (1,000 mg total) by mouth every 6 (six) hours as needed for mild pain or moderate pain.  0   apixaban  (ELIQUIS ) 5 MG TABS tablet Take 1 tablet (5 mg total) by mouth 2 (two) times daily. 60 tablet 2   aspirin EC 81 MG tablet Take 81 mg by mouth daily as needed (for pain or headaches).      benztropine   (COGENTIN ) 1 MG tablet Take 1 mg at bedtime by mouth.     clindamycin  (CLINDAGEL) 1 % gel Apply topically 2 (two) times daily. To skin rash on face and upper body 30 g 0   ferrous sulfate  325 (65 FE) MG EC tablet Take 1 tablet (325 mg total) by mouth daily. 30 tablet 3   hydrocortisone  cream 1 % Apply 1 Application topically 2 (two) times daily as needed for itching. For rash 30 g 2   loperamide  (IMODIUM ) 2 MG capsule Take 1 capsule (2 mg total) by mouth as needed for diarrhea or loose stools. 30 capsule 0   magnesium  oxide (MAG-OX) 400 (240 Mg) MG tablet Take 1 tablet (400 mg total) by mouth 2 (two) times daily. 30 tablet 2   Multiple Vitamin (MULTIVITAMIN WITH MINERALS) TABS tablet Take 1 tablet by mouth daily.     ondansetron  (ZOFRAN ) 8 MG tablet Take 1 tablet (8 mg total) by mouth every 8 (eight) hours as needed for nausea or vomiting. 30 tablet 0   paliperidone  (INVEGA  SUSTENNA) 156 MG/ML SUSP injection Inject 156 mg every 30 (thirty) days into the muscle.     polyethylene glycol (MIRALAX  / GLYCOLAX ) 17 g packet Take 17 g by mouth daily as needed for mild constipation or moderate constipation.  0   potassium chloride  SA (KLOR-CON  M) 20 MEQ tablet Take 1 tablet (20 mEq total) by mouth daily. 30 tablet 2   prochlorperazine  (COMPAZINE ) 10 MG tablet TAKE 1 TABLET(10 MG) BY MOUTH EVERY 6 HOURS AS NEEDED FOR NAUSEA OR VOMITING 30 tablet 0   No current facility-administered medications for this visit.  PHYSICAL EXAMINATION: ECOG PERFORMANCE STATUS: 2 - Symptomatic, <50% confined to bed  Vitals:   03/27/24 1100  BP: 94/64  Pulse: 100  Resp: 19  Temp: 98.7 F (37.1 C)  SpO2: 100%   Wt Readings from Last 3 Encounters:  03/27/24 145 lb 4.8 oz (65.9 kg)  03/13/24 143 lb (64.9 kg)  02/28/24 143 lb 4.8 oz (65 kg)     GENERAL:alert, no distress and comfortable SKIN: skin color, texture, turgor are normal, no rashes or significant lesions except a dry skin EYES: normal, Conjunctiva are  pink and non-injected, sclera clear NECK: supple, thyroid normal size, non-tender, without nodularity LYMPH:  no palpable lymphadenopathy in the cervical, axillary  LUNGS: clear to auscultation and percussion with normal breathing effort HEART: regular rate & rhythm and no murmurs and no lower extremity edema ABDOMEN:abdomen soft, non-tender and normal bowel sounds Musculoskeletal:no cyanosis of digits and no clubbing  NEURO: alert & oriented x 3 with fluent speech, no focal motor/sensory deficits  Physical Exam   LABORATORY DATA:  I have reviewed the data as listed    Latest Ref Rng & Units 03/27/2024   10:29 AM 03/13/2024   10:35 AM 02/28/2024   10:29 AM  CBC  WBC 4.0 - 10.5 K/uL 4.5  3.5  4.5   Hemoglobin 13.0 - 17.0 g/dL 87.9  87.9  87.7   Hematocrit 39.0 - 52.0 % 36.6  35.5  35.9   Platelets 150 - 400 K/uL 356  334  338         Latest Ref Rng & Units 03/27/2024   10:29 AM 03/13/2024   10:35 AM 02/28/2024   10:29 AM  CMP  Glucose 70 - 99 mg/dL 899  92  895   BUN 6 - 20 mg/dL 6  8  6    Creatinine 0.61 - 1.24 mg/dL 9.31  9.26  9.23   Sodium 135 - 145 mmol/L 137  138  138   Potassium 3.5 - 5.1 mmol/L 3.6  3.4  3.3   Chloride 98 - 111 mmol/L 106  107  106   CO2 22 - 32 mmol/L 25  25  25    Calcium  8.9 - 10.3 mg/dL 8.9  8.9  8.9   Total Protein 6.5 - 8.1 g/dL 6.2  6.3  6.5   Total Bilirubin 0.0 - 1.2 mg/dL 0.5  0.5  1.0   Alkaline Phos 38 - 126 U/L 107  79  93   AST 15 - 41 U/L 13  13  16    ALT 0 - 44 U/L 14  10  12        RADIOGRAPHIC STUDIES: I have personally reviewed the radiological images as listed and agreed with the findings in the report. No results found.    No orders of the defined types were placed in this encounter.  All questions were answered. The patient knows to call the clinic with any problems, questions or concerns. No barriers to learning was detected. The total time spent in the appointment was 25 minutes, including review of chart and various  tests results, discussions about plan of care and coordination of care plan     Onita Mattock, MD 03/27/2024

## 2024-03-29 ENCOUNTER — Inpatient Hospital Stay: Payer: MEDICAID

## 2024-04-04 ENCOUNTER — Ambulatory Visit (HOSPITAL_COMMUNITY)
Admission: RE | Admit: 2024-04-04 | Discharge: 2024-04-04 | Disposition: A | Discharge status: Home / Self Care | Payer: MEDICAID | Source: Ambulatory Visit | Attending: Hematology | Admitting: Hematology

## 2024-04-04 DIAGNOSIS — C186 Malignant neoplasm of descending colon: Secondary | ICD-10-CM | POA: Insufficient documentation

## 2024-04-04 MED ORDER — IOHEXOL 9 MG/ML PO SOLN
ORAL | Status: AC
  Filled 2024-04-04: qty 1000

## 2024-04-04 MED ORDER — IOHEXOL 300 MG/ML  SOLN
100.0000 mL | Freq: Once | INTRAMUSCULAR | Status: AC | PRN
  Administered 2024-04-04: 100 mL via INTRAVENOUS

## 2024-04-04 MED ORDER — IOHEXOL 9 MG/ML PO SOLN
1000.0000 mL | ORAL | Status: AC
  Administered 2024-04-04: 1000 mL via ORAL

## 2024-04-10 ENCOUNTER — Inpatient Hospital Stay: Payer: MEDICAID

## 2024-04-10 ENCOUNTER — Inpatient Hospital Stay: Payer: MEDICAID | Attending: Hematology

## 2024-04-10 ENCOUNTER — Inpatient Hospital Stay (HOSPITAL_BASED_OUTPATIENT_CLINIC_OR_DEPARTMENT_OTHER): Payer: MEDICAID | Attending: Hematology | Admitting: Hematology

## 2024-04-10 VITALS — HR 99

## 2024-04-10 VITALS — BP 110/80 | HR 109 | Temp 97.9°F | Resp 18 | Ht 69.0 in | Wt 142.6 lb

## 2024-04-10 DIAGNOSIS — C186 Malignant neoplasm of descending colon: Secondary | ICD-10-CM | POA: Diagnosis not present

## 2024-04-10 DIAGNOSIS — Z79631 Long term (current) use of antimetabolite agent: Secondary | ICD-10-CM | POA: Diagnosis not present

## 2024-04-10 DIAGNOSIS — C7802 Secondary malignant neoplasm of left lung: Secondary | ICD-10-CM | POA: Insufficient documentation

## 2024-04-10 DIAGNOSIS — C7800 Secondary malignant neoplasm of unspecified lung: Secondary | ICD-10-CM

## 2024-04-10 DIAGNOSIS — C7801 Secondary malignant neoplasm of right lung: Secondary | ICD-10-CM | POA: Diagnosis not present

## 2024-04-10 DIAGNOSIS — D500 Iron deficiency anemia secondary to blood loss (chronic): Secondary | ICD-10-CM

## 2024-04-10 DIAGNOSIS — C772 Secondary and unspecified malignant neoplasm of intra-abdominal lymph nodes: Secondary | ICD-10-CM | POA: Insufficient documentation

## 2024-04-10 DIAGNOSIS — Z5111 Encounter for antineoplastic chemotherapy: Secondary | ICD-10-CM | POA: Insufficient documentation

## 2024-04-10 DIAGNOSIS — Z95828 Presence of other vascular implants and grafts: Secondary | ICD-10-CM

## 2024-04-10 DIAGNOSIS — Z79899 Other long term (current) drug therapy: Secondary | ICD-10-CM | POA: Diagnosis not present

## 2024-04-10 DIAGNOSIS — Z5112 Encounter for antineoplastic immunotherapy: Secondary | ICD-10-CM | POA: Insufficient documentation

## 2024-04-10 DIAGNOSIS — Z79634 Long term (current) use of topoisomerase inhibitor: Secondary | ICD-10-CM | POA: Diagnosis not present

## 2024-04-10 LAB — CMP (CANCER CENTER ONLY)
ALT: 9 U/L (ref 0–44)
AST: 11 U/L — ABNORMAL LOW (ref 15–41)
Albumin: 3.9 g/dL (ref 3.5–5.0)
Alkaline Phosphatase: 86 U/L (ref 38–126)
Anion gap: 8 (ref 5–15)
BUN: 10 mg/dL (ref 6–20)
CO2: 24 mmol/L (ref 22–32)
Calcium: 9.2 mg/dL (ref 8.9–10.3)
Chloride: 105 mmol/L (ref 98–111)
Creatinine: 0.66 mg/dL (ref 0.61–1.24)
GFR, Estimated: >60 mL/min (ref >60)
Glucose, Bld: 111 mg/dL — ABNORMAL HIGH (ref 70–99)
Potassium: 3.4 mmol/L — ABNORMAL LOW (ref 3.5–5.1)
Sodium: 137 mmol/L (ref 135–145)
Total Bilirubin: 0.6 mg/dL (ref 0.0–1.2)
Total Protein: 6.6 g/dL (ref 6.5–8.1)

## 2024-04-10 LAB — CBC WITH DIFFERENTIAL (CANCER CENTER ONLY)
Abs Immature Granulocytes: 0.01 K/uL (ref 0.00–0.07)
Basophils Absolute: 0 K/uL (ref 0.0–0.1)
Basophils Relative: 1 %
Eosinophils Absolute: 0.2 K/uL (ref 0.0–0.5)
Eosinophils Relative: 5 %
HCT: 36.9 % — ABNORMAL LOW (ref 39.0–52.0)
Hemoglobin: 12.3 g/dL — ABNORMAL LOW (ref 13.0–17.0)
Immature Granulocytes: 0 %
Lymphocytes Relative: 24 %
Lymphs Abs: 0.8 K/uL (ref 0.7–4.0)
MCH: 27.8 pg (ref 26.0–34.0)
MCHC: 33.3 g/dL (ref 30.0–36.0)
MCV: 83.5 fL (ref 80.0–100.0)
Monocytes Absolute: 0.3 K/uL (ref 0.1–1.0)
Monocytes Relative: 9 %
Neutro Abs: 2 K/uL (ref 1.7–7.7)
Neutrophils Relative %: 61 %
Platelet Count: 358 K/uL (ref 150–400)
RBC: 4.42 MIL/uL (ref 4.22–5.81)
RDW: 15.6 % — ABNORMAL HIGH (ref 11.5–15.5)
WBC Count: 3.3 K/uL — ABNORMAL LOW (ref 4.0–10.5)
nRBC: 0 % (ref 0.0–0.2)

## 2024-04-10 LAB — MAGNESIUM: Magnesium: 1.7 mg/dL (ref 1.7–2.4)

## 2024-04-10 MED ORDER — PALONOSETRON HCL INJECTION 0.25 MG/5ML
0.2500 mg | Freq: Once | INTRAVENOUS | Status: AC
  Administered 2024-04-10: 0.25 mg via INTRAVENOUS
  Filled 2024-04-10: qty 5

## 2024-04-10 MED ORDER — SODIUM CHLORIDE 0.9 % IV SOLN
6.0000 mg/kg | Freq: Once | INTRAVENOUS | Status: AC
  Administered 2024-04-10: 400 mg via INTRAVENOUS
  Filled 2024-04-10: qty 20

## 2024-04-10 MED ORDER — SODIUM CHLORIDE 0.9 % IV SOLN: Freq: Once | INTRAVENOUS | Status: AC

## 2024-04-10 MED ORDER — DEXAMETHASONE SODIUM PHOSPHATE 10 MG/ML IJ SOLN
10.0000 mg | Freq: Once | INTRAMUSCULAR | Status: AC
  Administered 2024-04-10: 10 mg via INTRAVENOUS
  Filled 2024-04-10: qty 1

## 2024-04-10 MED ORDER — MAGNESIUM SULFATE 4 GM/100ML IV SOLN: 4.0000 g | Freq: Once | INTRAVENOUS | Status: DC

## 2024-04-10 MED ORDER — SODIUM CHLORIDE 0.9 % IV SOLN
180.0000 mg/m2 | Freq: Once | INTRAVENOUS | Status: AC
  Administered 2024-04-10: 320 mg via INTRAVENOUS
  Filled 2024-04-10: qty 11

## 2024-04-10 MED ORDER — SODIUM CHLORIDE 0.9% FLUSH: 10.0000 mL | INTRAVENOUS | Status: DC | PRN

## 2024-04-10 MED ORDER — SODIUM CHLORIDE 0.9 % IV SOLN
2400.0000 mg/m2 | INTRAVENOUS | Status: DC
  Administered 2024-04-10: 4350 mg via INTRAVENOUS
  Filled 2024-04-10: qty 87

## 2024-04-10 MED ORDER — ATROPINE SULFATE 1 MG/ML IV SOLN
0.5000 mg | Freq: Once | INTRAVENOUS | Status: AC | PRN
  Administered 2024-04-10: 0.5 mg via INTRAVENOUS
  Filled 2024-04-10: qty 1

## 2024-04-10 MED ORDER — MAGNESIUM SULFATE 2 GM/50ML IV SOLN
2.0000 g | Freq: Once | INTRAVENOUS | Status: AC
  Administered 2024-04-10: 2 g via INTRAVENOUS
  Filled 2024-04-10: qty 50

## 2024-04-10 MED ORDER — SODIUM CHLORIDE 0.9 % IV SOLN
400.0000 mg/m2 | Freq: Once | INTRAVENOUS | Status: AC
  Administered 2024-04-10: 728 mg via INTRAVENOUS
  Filled 2024-04-10: qty 25

## 2024-04-10 NOTE — Progress Notes (Signed)
 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  -He had a metastatic recurrence in the lungs and retroperitoneal adenopathy in September 2023 -he started FOLFIRI on 05/08/2022, Vectibix  added from cycle 2, tolerating well overall -restaging CT scan image from August 02, 2022 and 10/28/2022 both showed good partial response in pulmonary metastasis, no other new lesions. -we have changed his treatment to maintenance therapy with 5-fu and vectibix  in late March 2024 -He is tolerating treatment very well, will continue. -due to metastasis in both lungs and retroperitoneal lymph nodes, he is not a candidate for surgery or local therapy  -restaging CT 10/06/2023 showed stable disease overall except increased size of the pulmonary nodule in the lingula now measuring 7 mm Will continue current therapy. - Restaging CT scan on May 29th 2025 showed mild disease progression in the lung, I changed his minute therapy back to FOLFIRI and Vectibix  on 01/03/2024  Assessment & Plan Metastatic colon cancer with pulmonary metastases Recent CT scan shows pulmonary metastases are slightly smaller, indicating a positive response to the current treatment regimen. He is tolerating the treatment well without significant side effects such as  rash. The treatment appears more effective than previous maintenance therapy. - Continue current treatment regimen every two weeks - Schedule next appointment and treatment sessions for this month and next month - Discuss potential scheduling adjustments for holidays if needed  Abdominal cramping associated with eating Abdominal cramping associated with eating, described as a sensation of the stomach 'jumping' or causing hiccups, lasting about three minutes. No associated nausea or blood in bowel movements. - Advise drinking warm water to see if it helps with abdominal cramping  Plan - Restaging CT scan reviewed, overall stable disease - Lab reviewed, adequate for treatment, will proceed FOLFIRI and Vectibix , and continue every 2 weeks - Follow-up in 2 weeks   SUMMARY OF ONCOLOGIC HISTORY: Oncology History Overview Note   Cancer Staging  Cancer of left colon [facility]) Staging form: Colon and Rectum, AJCC 8th Edition - Pathologic stage from 08/08/2021: Stage IIIB (pT3, pN1a, cM0) - Signed by [name], MD on 08/26/2021    Cancer of left colon (HCC)  04/01/2020 Imaging   IMPRESSION: 1. Gallbladder decompressed bowel also partially calcified gallstones. No pericholecystic inflammation though if there is persisting clinical concern for cholecystitis right upper quadrant ultrasound could be obtained. 2. Circumferential thickening of the distal thoracic esophagus. Could reflect features of esophagitis. Correlate with clinical symptoms and consider endoscopy as clinically warranted. 3. Additional segmental thickening of the mid to distal sigmoid with focal narrowing. No acute surrounding inflammation or resulting obstruction. Findings are nonspecific, and could reflect sequela of prior inflammation/infection. However, recommend correlation with colonoscopy if not recently performed. 4. Mild circumferential bladder wall thickening and indentation of the bladder base by an enlarged prostate.  Possibly sequela of chronic outlet obstruction though could  correlate with urinalysis to exclude cystitis. 5. Aortic Atherosclerosis (ICD10-I70.0).   08/06/2021 Imaging   IMPRESSION: Sigmoid colonic perforation with small free intraperitoneal gas and infiltration of the mesenteric and omental fat in keeping with changes of peritonitis.   Long segment inflammatory stranding of the sigmoid colon in keeping with a severe infectious or inflammatory colitis. This terminates an area of irregular mural thickening, infiltrative soft tissue within the colonic mesentery, and focal dystrophic calcification. This may represent a chronic inflammatory process, however, a perforated malignancy could appear similarly. There are 2 separate points of perforation which again raise the question of an underlying malignancy.   7.1 cm gas and fluid containing pericolonic abscess within the sigmoid mesentery.   Marked inflammatory change of the terminal ileum adjacent to the pericolonic abscess with resultant small bowel obstruction. Fluid within the distal esophagus likely relates to gastroesophageal reflux the setting of vomiting.   Aortic Atherosclerosis (ICD10-I70.0).   08/08/2021 Cancer Staging   Staging form: Colon and Rectum, AJCC 8th Edition - Pathologic stage from 08/08/2021: Stage IIIB (pT3, pN1a, cM0) - Signed by [name], MD on 08/26/2021 Stage prefix: Initial diagnosis Total positive nodes: 1 Histologic grading system: 4 grade system Histologic grade (G): G2 Residual tumor (R): R0 - None   08/08/2021 Definitive Surgery   FINAL MICROSCOPIC DIAGNOSIS:   A. COLON, SIGMOID, PARTIAL COLECTOMY:  - Invasive moderately differentiated adenocarcinoma.  - Metastatic carcinoma involving one of twelve lymph nodes (1/12).  - See oncology table below.   ADDENDUM:  Mismatch Repair Protein (IHC)  SUMMARY INTERPRETATION: NORMAL    08/14/2021 Imaging   EXAM: CT ABDOMEN AND PELVIS WITH  CONTRAST  IMPRESSION: 1. Post recent sigmoid colectomy with left lower quadrant colostomy. Two small residual foci of air within the pelvic mesentery with mild adjacent thickening, but no abscess or drainable collection. Trace non organized free fluid and stranding in the pelvis. 2. Short segment of small bowel wall thickening and inflammation in the pelvis involving the distal ileum, likely reactive. 3. Dilated distal esophagus, stomach, and small bowel, without discrete transition point, favoring postoperative ileus. 4. Small bilateral pleural effusions and compressive atelectasis. 5. Heterogeneous partially enhancing 14 mm lymph node in the retroperitoneum at the aortoiliac bifurcation, not significantly changed from prior exam. Suspected additional lymph nodes in the anterior common iliac space, not significantly changed from prior exam. Recommend attention at follow-up. 6. Additional chronic findings as described.     08/15/2021 Imaging   EXAM: CT CHEST WITH CONTRAST  IMPRESSION: 1. No evidence of thoracic metastasis. 2. Bilateral small layering pleural effusions with passive atelectasis   08/26/2021 Initial Diagnosis   Cancer of left colon (HCC)   10/10/2021 - 12/12/2021 Chemotherapy   Patient is on Treatment Plan : COLORECTAL Xelox (Capeox) q21d     05/28/2022 - 05/28/2022 Chemotherapy   Patient is on Treatment Plan : COLORECTAL FOLFIRI + Bevacizumab q14d     05/28/2022 -  Chemotherapy   Patient is on Treatment Plan : COLORECTAL FOLFIRI + Panitumumab  q14d     07/31/2022 Imaging    IMPRESSION: Decreased bilateral pulmonary metastases.   Stable mild abdominal lymphadenopathy.   No new or progressive metastatic disease within the chest, abdomen, or pelvis.   01/18/2023 Imaging    IMPRESSION: 1. Status post Hetty pouch sigmoid colon resection with left lower quadrant end colostomy and rectal stump. Unchanged, mild wall thickening of the decompressed distal colon  at the ileostomy, as well as the rectal stump. 2. Unchanged small, treated metastatic pulmonary  nodules. No new nodules. 3. Unchanged enlarged, coarsely calcified treated lower aortocaval metastatic lymph nodes. No new lymphadenopathy. 4. Coronary artery disease. 5. Cholelithiasis.   04/23/2023 Imaging   CT chest, abdomen, and pelvis with contrast  IMPRESSION: 1. Left lower lobe segmental pulmonary emboli, incidental finding. No evidence for right heart strain. 2. Stable trace bilateral pleural effusions. 3. Stable enlarged lower left paraesophageal lymph node. 4. Stable calcified retroperitoneal lymph nodes. No new enlarged lymph nodes in the chest, abdomen or pelvis. 5. Stable subcentimeter soft tissue nodules within the mesenteric fat along the inferior ostomy. 6. Left Bosniak I benign renal cyst measuring 4.6 cm. No follow-up imaging is recommended. JACR 2018 Feb; 264-273, Management of the Incidental Renal Mass on CT, [employer] 2021; 814-848, Bosniak Classification of Cystic Renal Masses, Version 2019.   07/15/2023 Imaging   CT chest abdomen and pelvis with contrast IMPRESSION: 1. Prior partial left hemicolectomy with end colostomy in the left anterior abdominal wall. Unchanged mild wall thickening of the decompressed distal colon at the ileostomy as well as the rectal stump/Hartmann's pouch. 2. Stable enlarged left lower paraesophageal lymph node. 3. Increased size of the treated pulmonary nodule in the lingula now measuring 6 mm previously 4 mm with increased in size favored artifactual given motion degrading examination of the lung bases. Suggest attention on follow-up imaging 4. Unchanged size of the treated pulmonary nodule in the right middle lobe measuring 4 mm. 5. Increased size of prominent bilateral axillary lymph nodes measuring up to 7 mm in short axis, nonspecific. Suggest attention on follow-up imaging 6. No convincing evidence of new or progressive disease in the  chest, abdomen or pelvis. 7.  Aortic Atherosclerosis (ICD10-I70.0).     10/06/2023 Imaging   CT chest abdomen pelvis with contrast IMPRESSION: 1. Increased size of the pulmonary nodule in the lingula now measuring 7 mm, concerning for pulmonary metastatic disease recurrence. 2. Additional bilateral pulmonary nodules are stable. 3. Stable prominent bilateral axillary, low paraesophageal and retroperitoneal lymph nodes. 4. Prior partial left hemicolectomy with Hartmann's pouch formation and left anterior abdominal wall colostomy. No suspicious nodularity along the Hartmann's suture line. 5. Patulous esophagus with symmetric distal esophageal wall thickening, similar prior. 6. Trace right pleural effusion. 7.  Aortic Atherosclerosis (ICD10-I70.0).     12/30/2023 Imaging   CT chest, abdomen, and pelvis with contrast IMPRESSION: 1. Increased size of bilateral pulmonary nodules with new 4 mm left upper lobe and 5 mm superior segment right lower lobe pulmonary nodules, compatible with worsening pulmonary metastatic disease. 2. Stable prominent bilateral axillary and mediastinal lymph nodes. 3. Stable coarsely calcified retroperitoneal lymph nodes measuring up to 10 mm in short axis. No new pathologically enlarged abdominal or pelvic lymph nodes. 4. Stable trace right pleural effusion. 5. Prior partial left hemicolectomy with Hartmann's pouch formation and left anterior abdominal wall colostomy. No new suspicious nodularity along the suture line.      Discussed the use of AI scribe software for clinical note transcription with the patient, who gave verbal consent to proceed.  History of Present Illness Matthew [name] is a 58 year old male with metastatic colon cancer who presents for follow-up.  He is undergoing treatment for metastatic colon cancer. His weight is stable, ranging from 142 to 145 pounds. He experiences a sensation of his stomach 'jumping' when eating, similar to hiccups, lasting  about three minutes. This occurs consistently with eating but does not cause significant discomfort. He has no nausea, and bowel movements are regular without blood. Energy  levels are satisfactory, and he does not feel fatigued.     All other systems were reviewed with the patient and are negative.  MEDICAL HISTORY:  Past Medical History:  Diagnosis Date   Cancer (HCC)    Schizophrenia [facility])     SURGICAL HISTORY: Past Surgical History:  Procedure Laterality Date   BRONCHIAL BIOPSY  05/11/2022   Procedure: BRONCHIAL BIOPSIES;  Surgeon: Shelah Lamar RAMAN, MD;  Location: [facility] ENDOSCOPY;  Service: Pulmonary;;   BRONCHIAL BRUSHINGS  05/11/2022   Procedure: BRONCHIAL BRUSHINGS;  Surgeon: Shelah Lamar RAMAN, MD;  Location: [facility] ENDOSCOPY;  Service: Pulmonary;;   BRONCHIAL NEEDLE ASPIRATION BIOPSY  05/11/2022   Procedure: BRONCHIAL NEEDLE ASPIRATION BIOPSIES;  Surgeon: Shelah Lamar RAMAN, MD;  Location: [facility] ENDOSCOPY;  Service: Pulmonary;;   BRONCHIAL WASHINGS  05/11/2022   Procedure: BRONCHIAL WASHINGS;  Surgeon: Shelah Lamar RAMAN, MD;  Location: [facility] ENDOSCOPY;  Service: Pulmonary;;   IR IMAGING GUIDED PORT INSERTION  05/25/2022   LAPAROTOMY N/A 08/08/2021   Procedure: EXPLORATORY LAPAROTOMY;  Surgeon: [name], MD;  Location: [facility] OR;  Service: General;  Laterality: N/A;   PARTIAL COLECTOMY N/A 08/08/2021   Procedure: PARTIAL COLECTOMY WITH COLOSTOMY;  Surgeon: [name], MD;  Location: [facility] OR;  Service: General;  Laterality: N/A;   VIDEO BRONCHOSCOPY WITH RADIAL ENDOBRONCHIAL ULTRASOUND  05/11/2022   Procedure: VIDEO BRONCHOSCOPY WITH RADIAL ENDOBRONCHIAL ULTRASOUND;  Surgeon: Shelah Lamar RAMAN, MD;  Location: [facility] ENDOSCOPY;  Service: Pulmonary;;    I have reviewed the social history and family history with the patient and they are unchanged from previous note.  ALLERGIES:  has no known allergies.  MEDICATIONS:  Current Outpatient Medications  Medication Sig Dispense Refill   acetaminophen   (TYLENOL ) 500 MG tablet Take 2 tablets (1,000 mg total) by mouth every 6 (six) hours as needed for mild pain or moderate pain.  0   apixaban  (ELIQUIS ) 5 MG TABS tablet Take 1 tablet (5 mg total) by mouth 2 (two) times daily. 60 tablet 2   aspirin EC 81 MG tablet Take 81 mg by mouth daily as needed (for pain or headaches).      benztropine  (COGENTIN ) 1 MG tablet Take 1 mg at bedtime by mouth.     clindamycin  (CLINDAGEL) 1 % gel Apply topically 2 (two) times daily. To skin rash on face and upper body 30 g 0   ferrous sulfate  325 (65 FE) MG EC tablet Take 1 tablet (325 mg total) by mouth daily. 30 tablet 3   hydrocortisone  cream 1 % Apply 1 Application topically 2 (two) times daily as needed for itching. For rash 30 g 2   loperamide  (IMODIUM ) 2 MG capsule Take 1 capsule (2 mg total) by mouth as needed for diarrhea or loose stools. 30 capsule 0   magnesium  oxide (MAG-OX) 400 (240 Mg) MG tablet Take 1 tablet (400 mg total) by mouth 2 (two) times daily. 30 tablet 2   Multiple Vitamin (MULTIVITAMIN WITH MINERALS) TABS tablet Take 1 tablet by mouth daily.     ondansetron  (ZOFRAN ) 8 MG tablet Take 1 tablet (8 mg total) by mouth every 8 (eight) hours as needed for nausea or vomiting. 30 tablet 0   paliperidone  (INVEGA  SUSTENNA) 156 MG/ML SUSP injection Inject 156 mg every 30 (thirty) days into the muscle.     polyethylene glycol (MIRALAX  / GLYCOLAX ) 17 g packet Take 17 g by mouth daily as needed for mild constipation or moderate constipation.  0   potassium chloride  SA (KLOR-CON   M) 20 MEQ tablet Take 1 tablet (20 mEq total) by mouth daily. 30 tablet 2   prochlorperazine  (COMPAZINE ) 10 MG tablet TAKE 1 TABLET(10 MG) BY MOUTH EVERY 6 HOURS AS NEEDED FOR NAUSEA OR VOMITING 30 tablet 0   No current facility-administered medications for this visit.   Facility-Administered Medications Ordered in Other Visits  Medication Dose Route Frequency Provider Last Rate Last Admin   dexamethasone  (DECADRON ) injection 10 mg   10 mg Intravenous Once [name], MD       fluorouracil  (ADRUCIL ) 4,350 mg in sodium chloride  0.9 % 63 mL chemo infusion  2,400 mg/m2 (Treatment Plan Recorded) Intravenous 1 day or 1 dose [name], MD       irinotecan  (CAMPTOSAR ) 320 mg in sodium chloride  0.9 % 500 mL chemo infusion  180 mg/m2 (Treatment Plan Recorded) Intravenous Once [name], MD       leucovorin  728 mg in sodium chloride  0.9 % 250 mL infusion  400 mg/m2 (Treatment Plan Recorded) Intravenous Once [name], MD       palonosetron  (ALOXI ) injection 0.25 mg  0.25 mg Intravenous Once [name], MD       panitumumab  (VECTIBIX ) 400 mg in sodium chloride  0.9 % 100 mL chemo infusion  6 mg/kg (Treatment Plan Recorded) Intravenous Once [name], MD       sodium chloride  flush (NS) 0.9 % injection 10 mL  10 mL Intracatheter PRN [name], MD        PHYSICAL EXAMINATION: ECOG PERFORMANCE STATUS: 1 - Symptomatic but completely ambulatory  Vitals:   04/10/24 1026  BP: 110/80  Pulse: (!) 109  Resp: 18  Temp: 97.9 F (36.6 C)  SpO2: 99%   Wt Readings from Last 3 Encounters:  04/10/24 142 lb 9.6 oz (64.7 kg)  03/27/24 145 lb 4.8 oz (65.9 kg)  03/13/24 143 lb (64.9 kg)     GENERAL:alert, no distress and comfortable SKIN: skin color, texture, turgor are normal, no rashes or significant lesions EYES: normal, Conjunctiva are pink and non-injected, sclera clear NECK: supple, thyroid normal size, non-tender, without nodularity LYMPH:  no palpable lymphadenopathy in the cervical, axillary  LUNGS: clear to auscultation and percussion with normal breathing effort HEART: regular rate & rhythm and no murmurs and no lower extremity edema ABDOMEN:abdomen soft, non-tender and normal bowel sounds Musculoskeletal:no cyanosis of digits and no clubbing  NEURO: alert & oriented x 3 with fluent speech, no focal motor/sensory deficits  Physical Exam MEASUREMENTS: Weight- 142. SKIN: Skin normal, no rash  LABORATORY DATA:  I have reviewed  the data as listed    Latest Ref Rng & Units 04/10/2024    9:53 AM 03/27/2024   10:29 AM 03/13/2024   10:35 AM  CBC  WBC 4.0 - 10.5 K/uL 3.3  4.5  3.5   Hemoglobin 13.0 - 17.0 g/dL 87.6  87.9  87.9   Hematocrit 39.0 - 52.0 % 36.9  36.6  35.5   Platelets 150 - 400 K/uL 358  356  334         Latest Ref Rng & Units 04/10/2024    9:53 AM 03/27/2024   10:29 AM 03/13/2024   10:35 AM  CMP  Glucose 70 - 99 mg/dL 888  899  92   BUN 6 - 20 mg/dL 10  6  8    Creatinine 0.61 - 1.24 mg/dL 9.33  9.31  9.26   Sodium 135 - 145 mmol/L 137  137  138   Potassium 3.5 - 5.1  mmol/L 3.4  3.6  3.4   Chloride 98 - 111 mmol/L 105  106  107   CO2 22 - 32 mmol/L 24  25  25    Calcium  8.9 - 10.3 mg/dL 9.2  8.9  8.9   Total Protein 6.5 - 8.1 g/dL 6.6  6.2  6.3   Total Bilirubin 0.0 - 1.2 mg/dL 0.6  0.5  0.5   Alkaline Phos 38 - 126 U/L 86  107  79   AST 15 - 41 U/L 11  13  13    ALT 0 - 44 U/L 9  14  10        RADIOGRAPHIC STUDIES: I have personally reviewed the radiological images as listed and agreed with the findings in the report. No results found.    Orders Placed This Encounter  Procedures   CBC with Differential (Cancer Center Only)    Standing Status:   Future    Expected Date:   05/08/2024    Expiration Date:   05/08/2025   CMP (Cancer Center only)    Standing Status:   Future    Expected Date:   05/08/2024    Expiration Date:   05/09/2025   Magnesium     Standing Status:   Future    Expected Date:   05/08/2024    Expiration Date:   05/08/2025   CBC with Differential (Cancer Center Only)    Standing Status:   Future    Expected Date:   05/22/2024    Expiration Date:   05/22/2025   CMP (Cancer Center only)    Standing Status:   Future    Expected Date:   05/22/2024    Expiration Date:   05/23/2025   Magnesium     Standing Status:   Future    Expected Date:   05/22/2024    Expiration Date:   05/22/2025   CBC with Differential (Cancer Center Only)    Standing Status:   Future    Expected  Date:   06/05/2024    Expiration Date:   06/05/2025   CMP (Cancer Center only)    Standing Status:   Future    Expected Date:   06/05/2024    Expiration Date:   06/06/2025   Magnesium     Standing Status:   Future    Expected Date:   06/05/2024    Expiration Date:   06/05/2025   All questions were answered. The patient knows to call the clinic with any problems, questions or concerns. No barriers to learning was detected. The total time spent in the appointment was 30 minutes, including review of chart and various tests results, discussions about plan of care and coordination of care plan     Onita Mattock, MD 04/10/2024

## 2024-04-10 NOTE — Progress Notes (Signed)
 Reduce magnesium  dose to 2g today and for future cycles per Dr. Lanny.  Harlene Nasuti, PharmD Oncology Infusion Pharmacist 04/10/2024 11:20 AM

## 2024-04-10 NOTE — Patient Instructions (Signed)
 [facility] - A DEPT OF [place] [facility]   Discharge Instructions: Thank you for choosing [facility] to provide your oncology and hematology care.   If you have a lab appointment with the Cancer Center, please go directly to the Cancer Center and check in at the registration area.   Wear comfortable clothing and clothing appropriate for easy access to any Portacath or PICC line.   We strive to give you quality time with your provider. You may need to reschedule your appointment if you arrive late (15 or more minutes).  Arriving late affects you and other patients whose appointments are after yours.  Also, if you miss three or more appointments without notifying the office, you may be dismissed from the clinic at the provider's discretion.      For prescription refill requests, have your pharmacy contact our office and allow 72 hours for refills to be completed.    Today you received the following chemotherapy and/or immunotherapy agents: Panitumumab  (Vectibix ), Irinotecan , Leucovorin , Fluorouracil  (Adrucil )      To help prevent nausea and vomiting after your treatment, we encourage you to take your nausea medication as directed.  BELOW ARE SYMPTOMS THAT SHOULD BE REPORTED IMMEDIATELY: *FEVER GREATER THAN 100.4 F (38 C) OR HIGHER *CHILLS OR SWEATING *NAUSEA AND VOMITING THAT IS NOT CONTROLLED WITH YOUR NAUSEA MEDICATION *UNUSUAL SHORTNESS OF BREATH *UNUSUAL BRUISING OR BLEEDING *URINARY PROBLEMS (pain or burning when urinating, or frequent urination) *BOWEL PROBLEMS (unusual diarrhea, constipation, pain near the anus) TENDERNESS IN MOUTH AND THROAT WITH OR WITHOUT PRESENCE OF ULCERS (sore throat, sores in mouth, or a toothache) UNUSUAL RASH, SWELLING OR PAIN  UNUSUAL VAGINAL DISCHARGE OR ITCHING   Items with * indicate a potential emergency and should be followed up as soon as possible or go to the Emergency Department if any problems should  occur.  Please show the CHEMOTHERAPY ALERT CARD or IMMUNOTHERAPY ALERT CARD at check-in to the Emergency Department and triage nurse.  Should you have questions after your visit or need to cancel or reschedule your appointment, please contact [facility]  Dept: [phone]  and follow the prompts.  Office hours are 8:00 a.m. to 4:30 p.m. Monday - Friday. Please note that voicemails left after 4:00 p.m. may not be returned until the following business day.  We are closed weekends and major holidays. You have access to a nurse at all times for urgent questions. Please call the main number to the clinic Dept: [phone] and follow the prompts.   For any non-urgent questions, you may also contact your provider using MyChart. We now offer e-Visits for anyone 17 and older to request care online for non-urgent symptoms. For details visit [email or web address].   Also download the MyChart app! Go to the app store, search MyChart, open the app, select [facility], and log in with your MyChart username and password.  The chemotherapy medication bag should finish at 46 hours, 96 hours, or 7 days. For example, if your pump is scheduled for 46 hours and it was put on at 4:00 p.m., it should finish at 2:00 p.m. the day it is scheduled to come off regardless of your appointment time.     Estimated time to finish at   If the display on your pump reads Low Volume and it is beeping, take the batteries out of the pump and come to the  cancer center for it to be taken off.   If the pump alarms go off prior to the pump reading Low Volume then call [phone] and someone can assist you.  If the plunger comes out and the chemotherapy medication is leaking out, please use your home chemo spill kit to clean up the spill. Do NOT use paper towels or other household products.  If you have problems or questions regarding your pump, please call either  [phone] (24 hours a day) or the cancer center Monday-Friday 8:00 a.m.- 4:30 p.m. at the clinic number and we will assist you. If you are unable to get assistance, then go to the nearest Emergency Department and ask the staff to contact the IV team for assistance.

## 2024-04-10 NOTE — Assessment & Plan Note (Signed)
 stage IIIB p(T3, N1aM0), MSS, KRAS/NRAS/BRAF wild type, lung and node metastasis in 04/2022  -Initially diagnosed in 08/2021 -he completed 4 cycles adjuvant CAPOX 10/10/21 - 12/25/21 (though he somehow was still taking Xeloda  through ~02/09/22 due to misunderstandings).  -He had a metastatic recurrence in the lungs and retroperitoneal adenopathy in September 2023 -he started FOLFIRI on 05/08/2022, Vectibix  added from cycle 2, tolerating well overall -restaging CT scan image from August 02, 2022 and 10/28/2022 both showed good partial response in pulmonary metastasis, no other new lesions. -we have changed his treatment to maintenance therapy with 5-fu and vectibix  in late March 2024 -He is tolerating treatment very well, will continue. -due to metastasis in both lungs and retroperitoneal lymph nodes, he is not a candidate for surgery or local therapy  -restaging CT 10/06/2023 showed stable disease overall except increased size of the pulmonary nodule in the lingula now measuring 7 mm Will continue current therapy. - Restaging CT scan on May 29th 2025 showed mild disease progression in the lung, I changed his minute therapy back to FOLFIRI and Vectibix  on 01/03/2024

## 2024-04-11 ENCOUNTER — Telehealth: Payer: Self-pay | Admitting: Hematology

## 2024-04-11 NOTE — Telephone Encounter (Signed)
 LVM  regarding  his upcoming appts and stated to  stop by scheduled.

## 2024-04-12 ENCOUNTER — Other Ambulatory Visit: Payer: Self-pay

## 2024-04-12 ENCOUNTER — Inpatient Hospital Stay: Payer: MEDICAID

## 2024-04-12 VITALS — BP 116/84 | HR 128 | Temp 98.7°F | Resp 19

## 2024-04-12 DIAGNOSIS — C189 Malignant neoplasm of colon, unspecified: Secondary | ICD-10-CM

## 2024-04-12 DIAGNOSIS — Z95828 Presence of other vascular implants and grafts: Secondary | ICD-10-CM

## 2024-04-12 MED ORDER — SODIUM CHLORIDE 0.9% FLUSH
10.0000 mL | Freq: Once | INTRAVENOUS | Status: AC
  Administered 2024-04-12: 10 mL

## 2024-04-24 ENCOUNTER — Inpatient Hospital Stay (HOSPITAL_BASED_OUTPATIENT_CLINIC_OR_DEPARTMENT_OTHER): Payer: MEDICAID | Admitting: Hematology

## 2024-04-24 ENCOUNTER — Inpatient Hospital Stay: Payer: MEDICAID

## 2024-04-24 VITALS — BP 100/80 | HR 96 | Temp 97.6°F | Resp 16 | Ht 69.0 in | Wt 142.7 lb

## 2024-04-24 DIAGNOSIS — C186 Malignant neoplasm of descending colon: Secondary | ICD-10-CM

## 2024-04-24 DIAGNOSIS — Z5111 Encounter for antineoplastic chemotherapy: Secondary | ICD-10-CM | POA: Diagnosis not present

## 2024-04-24 LAB — CMP (CANCER CENTER ONLY)
ALT: 9 U/L (ref 0–44)
AST: 12 U/L — ABNORMAL LOW (ref 15–41)
Albumin: 3.7 g/dL (ref 3.5–5.0)
Alkaline Phosphatase: 101 U/L (ref 38–126)
Anion gap: 6 (ref 5–15)
BUN: 10 mg/dL (ref 6–20)
CO2: 24 mmol/L (ref 22–32)
Calcium: 8.8 mg/dL — ABNORMAL LOW (ref 8.9–10.3)
Chloride: 109 mmol/L (ref 98–111)
Creatinine: 0.66 mg/dL (ref 0.61–1.24)
GFR, Estimated: >60 mL/min (ref >60)
Glucose, Bld: 95 mg/dL (ref 70–99)
Potassium: 3.8 mmol/L (ref 3.5–5.1)
Sodium: 139 mmol/L (ref 135–145)
Total Bilirubin: 0.6 mg/dL (ref 0.0–1.2)
Total Protein: 6.3 g/dL — ABNORMAL LOW (ref 6.5–8.1)

## 2024-04-24 LAB — CBC WITH DIFFERENTIAL (CANCER CENTER ONLY)
Abs Immature Granulocytes: 0.01 K/uL (ref 0.00–0.07)
Basophils Absolute: 0 K/uL (ref 0.0–0.1)
Basophils Relative: 1 %
Eosinophils Absolute: 0.2 K/uL (ref 0.0–0.5)
Eosinophils Relative: 6 %
HCT: 36.5 % — ABNORMAL LOW (ref 39.0–52.0)
Hemoglobin: 12 g/dL — ABNORMAL LOW (ref 13.0–17.0)
Immature Granulocytes: 0 %
Lymphocytes Relative: 22 %
Lymphs Abs: 0.8 K/uL (ref 0.7–4.0)
MCH: 27.9 pg (ref 26.0–34.0)
MCHC: 32.9 g/dL (ref 30.0–36.0)
MCV: 84.9 fL (ref 80.0–100.0)
Monocytes Absolute: 0.3 K/uL (ref 0.1–1.0)
Monocytes Relative: 8 %
Neutro Abs: 2.3 K/uL (ref 1.7–7.7)
Neutrophils Relative %: 63 %
Platelet Count: 346 K/uL (ref 150–400)
RBC: 4.3 MIL/uL (ref 4.22–5.81)
RDW: 16 % — ABNORMAL HIGH (ref 11.5–15.5)
WBC Count: 3.6 K/uL — ABNORMAL LOW (ref 4.0–10.5)
nRBC: 0 % (ref 0.0–0.2)

## 2024-04-24 LAB — MAGNESIUM: Magnesium: 1.7 mg/dL (ref 1.7–2.4)

## 2024-04-24 MED ORDER — DEXAMETHASONE SODIUM PHOSPHATE 10 MG/ML IJ SOLN
10.0000 mg | Freq: Once | INTRAMUSCULAR | Status: AC
  Administered 2024-04-24: 10 mg via INTRAVENOUS
  Filled 2024-04-24: qty 1

## 2024-04-24 MED ORDER — ATROPINE SULFATE 1 MG/ML IV SOLN
0.5000 mg | Freq: Once | INTRAVENOUS | Status: AC | PRN
  Administered 2024-04-24: 0.5 mg via INTRAVENOUS
  Filled 2024-04-24: qty 1

## 2024-04-24 MED ORDER — SODIUM CHLORIDE 0.9 % IV SOLN
400.0000 mg/m2 | Freq: Once | INTRAVENOUS | Status: AC
  Administered 2024-04-24: 728 mg via INTRAVENOUS
  Filled 2024-04-24: qty 25

## 2024-04-24 MED ORDER — SODIUM CHLORIDE 0.9 % IV SOLN
180.0000 mg/m2 | Freq: Once | INTRAVENOUS | Status: AC
  Administered 2024-04-24: 320 mg via INTRAVENOUS
  Filled 2024-04-24: qty 15

## 2024-04-24 MED ORDER — MAGNESIUM SULFATE 2 GM/50ML IV SOLN
2.0000 g | Freq: Once | INTRAVENOUS | Status: AC
  Administered 2024-04-24: 2 g via INTRAVENOUS
  Filled 2024-04-24: qty 50

## 2024-04-24 MED ORDER — SODIUM CHLORIDE 0.9 % IV SOLN: Freq: Once | INTRAVENOUS | Status: AC

## 2024-04-24 MED ORDER — SODIUM CHLORIDE 0.9 % IV SOLN
6.0000 mg/kg | Freq: Once | INTRAVENOUS | Status: AC
  Administered 2024-04-24: 400 mg via INTRAVENOUS
  Filled 2024-04-24: qty 20

## 2024-04-24 MED ORDER — SODIUM CHLORIDE 0.9 % IV SOLN
2400.0000 mg/m2 | INTRAVENOUS | Status: DC
  Administered 2024-04-24: 4350 mg via INTRAVENOUS
  Filled 2024-04-24: qty 87

## 2024-04-24 MED ORDER — PALONOSETRON HCL INJECTION 0.25 MG/5ML
0.2500 mg | Freq: Once | INTRAVENOUS | Status: AC
  Administered 2024-04-24: 0.25 mg via INTRAVENOUS
  Filled 2024-04-24: qty 5

## 2024-04-24 NOTE — Progress Notes (Signed)
 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  -He had a metastatic recurrence in the lungs and retroperitoneal adenopathy in September 2023 -he started FOLFIRI on 05/08/2022, Vectibix  added from cycle 2, tolerating well overall -restaging CT scan image from August 02, 2022 and 10/28/2022 both showed good partial response in pulmonary metastasis, no other new lesions. -we have changed his treatment to maintenance therapy with 5-fu and vectibix  in late March 2024 -He is tolerating treatment very well, will continue. -due to metastasis in both lungs and retroperitoneal lymph nodes, he is not a candidate for surgery or local therapy  -restaging CT 10/06/2023 showed stable disease overall except increased size of the pulmonary nodule in the lingula now measuring 7 mm Will continue current therapy. - Restaging CT scan on May 29th 2025 showed mild disease progression in the lung, I changed his chemotherapy back to FOLFIRI and Vectibix  on 01/03/2024 -CT 04/04/2024 showed stable disease, will continue current therapy   Assessment & Plan Metastatic colon cancer with pulmonary metastases Metastatic colon cancer with pulmonary metastases is being managed with chemotherapy. Recent CT scan from April 04, 2024, shows  improvement with reduction in size of pulmonary nodules. He is tolerating chemotherapy well without significant side effects. No new symptoms such as cough, shortness of breath, or chest discomfort reported. - Continue current chemotherapy regimen - Schedule follow-up appointment in two weeks - Review additional test results when available  Hypomagnesemia Chronic hypomagnesemia is being managed with oral magnesium  supplementation. Current levels are not available. He is taking oral magnesium  twice daily, but the dosage is being increased to three times daily due to persistently low levels. Intravenous magnesium  supplementation is also being administered. Compliance with magnesium  supplementation is emphasized, with instructions for him to take medication in the presence of a caregiver. - Increase oral magnesium  supplementation to three times daily, to be taken after each meal - Continue intravenous magnesium  supplementation as needed - Ensure compliance with magnesium  supplementation by having him take medication in the presence of a caregiver  Plan - I reviewed his recent CT scan, which showed stable disease overall.  - Lab reviewed, adequate for treatment, will proceed to chemo today and continue every 2 weeks - Continue IV magnesium  4 g with chemo, and increase oral magnesium  to 3 times a day - Follow-up in 2 weeks   SUMMARY OF ONCOLOGIC HISTORY: Oncology History Overview Note   Cancer Staging  Cancer of left colon [facility]) Staging form: Colon and Rectum, AJCC 8th Edition - Pathologic stage from 08/08/2021: Stage IIIB (pT3, pN1a, cM0) - Signed by Matthew Callander, MD on 08/26/2021    Cancer of left colon (HCC)  04/01/2020 Imaging   IMPRESSION: 1. Gallbladder decompressed bowel also partially calcified gallstones. No pericholecystic inflammation though if there is persisting clinical concern for cholecystitis right upper quadrant ultrasound could be obtained. 2. Circumferential  thickening of the  distal thoracic esophagus. Could reflect features of esophagitis. Correlate with clinical symptoms and consider endoscopy as clinically warranted. 3. Additional segmental thickening of the mid to distal sigmoid with focal narrowing. No acute surrounding inflammation or resulting obstruction. Findings are nonspecific, and could reflect sequela of prior inflammation/infection. However, recommend correlation with colonoscopy if not recently performed. 4. Mild circumferential bladder wall thickening and indentation of the bladder base by an enlarged prostate. Possibly sequela of chronic outlet obstruction though could correlate with urinalysis to exclude cystitis. 5. Aortic Atherosclerosis (ICD10-I70.0).   08/06/2021 Imaging   IMPRESSION: Sigmoid colonic perforation with small free intraperitoneal gas and infiltration of the mesenteric and omental fat in keeping with changes of peritonitis.   Long segment inflammatory stranding of the sigmoid colon in keeping with a severe infectious or inflammatory colitis. This terminates an area of irregular mural thickening, infiltrative soft tissue within the colonic mesentery, and focal dystrophic calcification. This may represent a chronic inflammatory process, however, a perforated malignancy could appear similarly. There are 2 separate points of perforation which again raise the question of an underlying malignancy.   7.1 cm gas and fluid containing pericolonic abscess within the sigmoid mesentery.   Marked inflammatory change of the terminal ileum adjacent to the pericolonic abscess with resultant small bowel obstruction. Fluid within the distal esophagus likely relates to gastroesophageal reflux the setting of vomiting.   Aortic Atherosclerosis (ICD10-I70.0).   08/08/2021 Cancer Staging   Staging form: Colon and Rectum, AJCC 8th Edition - Pathologic stage from 08/08/2021: Stage IIIB (pT3, pN1a, cM0) - Signed by Matthew Callander, MD on 08/26/2021 Stage  prefix: Initial diagnosis Total positive nodes: 1 Histologic grading system: 4 grade system Histologic grade (G): G2 Residual tumor (R): R0 - None   08/08/2021 Definitive Surgery   FINAL MICROSCOPIC DIAGNOSIS:   A. COLON, SIGMOID, PARTIAL COLECTOMY:  - Invasive moderately differentiated adenocarcinoma.  - Metastatic carcinoma involving one of twelve lymph nodes (1/12).  - See oncology table below.   ADDENDUM:  Mismatch Repair Protein (IHC)  SUMMARY INTERPRETATION: NORMAL    08/14/2021 Imaging   EXAM: CT ABDOMEN AND PELVIS WITH CONTRAST  IMPRESSION: 1. Post recent sigmoid colectomy with left lower quadrant colostomy. Two small residual foci of air within the pelvic mesentery with mild adjacent thickening, but no abscess or drainable collection. Trace non organized free fluid and stranding in the pelvis. 2. Short segment of small bowel wall thickening and inflammation in the pelvis involving the distal ileum, likely reactive. 3. Dilated distal esophagus, stomach, and small bowel, without discrete transition point, favoring postoperative ileus. 4. Small bilateral pleural effusions and compressive atelectasis. 5. Heterogeneous partially enhancing 14 mm lymph node in the retroperitoneum at the aortoiliac bifurcation, not significantly changed from prior exam. Suspected additional lymph nodes in the anterior common iliac space, not significantly changed from prior exam. Recommend attention at follow-up. 6. Additional chronic findings as described.     08/15/2021 Imaging   EXAM: CT CHEST WITH CONTRAST  IMPRESSION: 1. No evidence of thoracic metastasis. 2. Bilateral small layering pleural effusions with passive atelectasis   08/26/2021 Initial Diagnosis   Cancer of left colon (HCC)   10/10/2021 - 12/12/2021 Chemotherapy   Patient is on Treatment Plan : COLORECTAL Xelox (Capeox) q21d     05/28/2022 - 05/28/2022 Chemotherapy   Patient is on Treatment Plan : COLORECTAL FOLFIRI +  Bevacizumab q14d     05/28/2022 -  Chemotherapy   Patient is on Treatment Plan : COLORECTAL FOLFIRI +  Panitumumab  q14d     07/31/2022 Imaging    IMPRESSION: Decreased bilateral pulmonary metastases.   Stable mild abdominal lymphadenopathy.   No new or progressive metastatic disease within the chest, abdomen, or pelvis.   01/18/2023 Imaging    IMPRESSION: 1. Status post Hetty pouch sigmoid colon resection with left lower quadrant end colostomy and rectal stump. Unchanged, mild wall thickening of the decompressed distal colon at the ileostomy, as well as the rectal stump. 2. Unchanged small, treated metastatic pulmonary nodules. No new nodules. 3. Unchanged enlarged, coarsely calcified treated lower aortocaval metastatic lymph nodes. No new lymphadenopathy. 4. Coronary artery disease. 5. Cholelithiasis.   04/23/2023 Imaging   CT chest, abdomen, and pelvis with contrast  IMPRESSION: 1. Left lower lobe segmental pulmonary emboli, incidental finding. No evidence for right heart strain. 2. Stable trace bilateral pleural effusions. 3. Stable enlarged lower left paraesophageal lymph node. 4. Stable calcified retroperitoneal lymph nodes. No new enlarged lymph nodes in the chest, abdomen or pelvis. 5. Stable subcentimeter soft tissue nodules within the mesenteric fat along the inferior ostomy. 6. Left Bosniak I benign renal cyst measuring 4.6 cm. No follow-up imaging is recommended. JACR 2018 Feb; 264-273, Management of the Incidental Renal Mass on CT, [employer] 2021; 814-848, Bosniak Classification of Cystic Renal Masses, Version 2019.   07/15/2023 Imaging   CT chest abdomen and pelvis with contrast IMPRESSION: 1. Prior partial left hemicolectomy with end colostomy in the left anterior abdominal wall. Unchanged mild wall thickening of the decompressed distal colon at the ileostomy as well as the rectal stump/Hartmann's pouch. 2. Stable enlarged left lower paraesophageal lymph  node. 3. Increased size of the treated pulmonary nodule in the lingula now measuring 6 mm previously 4 mm with increased in size favored artifactual given motion degrading examination of the lung bases. Suggest attention on follow-up imaging 4. Unchanged size of the treated pulmonary nodule in the right middle lobe measuring 4 mm. 5. Increased size of prominent bilateral axillary lymph nodes measuring up to 7 mm in short axis, nonspecific. Suggest attention on follow-up imaging 6. No convincing evidence of new or progressive disease in the chest, abdomen or pelvis. 7.  Aortic Atherosclerosis (ICD10-I70.0).     10/06/2023 Imaging   CT chest abdomen pelvis with contrast IMPRESSION: 1. Increased size of the pulmonary nodule in the lingula now measuring 7 mm, concerning for pulmonary metastatic disease recurrence. 2. Additional bilateral pulmonary nodules are stable. 3. Stable prominent bilateral axillary, low paraesophageal and retroperitoneal lymph nodes. 4. Prior partial left hemicolectomy with Hartmann's pouch formation and left anterior abdominal wall colostomy. No suspicious nodularity along the Hartmann's suture line. 5. Patulous esophagus with symmetric distal esophageal wall thickening, similar prior. 6. Trace right pleural effusion. 7.  Aortic Atherosclerosis (ICD10-I70.0).     12/30/2023 Imaging   CT chest, abdomen, and pelvis with contrast IMPRESSION: 1. Increased size of bilateral pulmonary nodules with new 4 mm left upper lobe and 5 mm superior segment right lower lobe pulmonary nodules, compatible with worsening pulmonary metastatic disease. 2. Stable prominent bilateral axillary and mediastinal lymph nodes. 3. Stable coarsely calcified retroperitoneal lymph nodes measuring up to 10 mm in short axis. No new pathologically enlarged abdominal or pelvic lymph nodes. 4. Stable trace right pleural effusion. 5. Prior partial left hemicolectomy with Hartmann's pouch formation and left  anterior abdominal wall colostomy. No new suspicious nodularity along the suture line.      Discussed the use of AI scribe software for clinical note transcription with the patient, who  gave verbal consent to proceed.  History of Present Illness [name] is a 58 year old male with metastatic colon cancer who presents for follow-up. He is accompanied by his mother.  He has no new symptoms such as gastrointestinal issues, cough, dyspnea, or chest discomfort. A recent CT scan from September 2nd shows improvement with smaller lung nodules and no new findings.  His magnesium  levels remain consistently low. He is taking magnesium  supplements and receiving magnesium  intravenously.     All other systems were reviewed with the patient and are negative.  MEDICAL HISTORY:  Past Medical History:  Diagnosis Date   Cancer (HCC)    Schizophrenia [facility])     SURGICAL HISTORY: Past Surgical History:  Procedure Laterality Date   BRONCHIAL BIOPSY  05/11/2022   Procedure: BRONCHIAL BIOPSIES;  Surgeon: Shelah Lamar RAMAN, MD;  Location: [name] ENDOSCOPY;  Service: Pulmonary;;   BRONCHIAL BRUSHINGS  05/11/2022   Procedure: BRONCHIAL BRUSHINGS;  Surgeon: Shelah Lamar RAMAN, MD;  Location: [facility] ENDOSCOPY;  Service: Pulmonary;;   BRONCHIAL NEEDLE ASPIRATION BIOPSY  05/11/2022   Procedure: BRONCHIAL NEEDLE ASPIRATION BIOPSIES;  Surgeon: Shelah Lamar RAMAN, MD;  Location: [facility] ENDOSCOPY;  Service: Pulmonary;;   BRONCHIAL WASHINGS  05/11/2022   Procedure: BRONCHIAL WASHINGS;  Surgeon: Shelah Lamar RAMAN, MD;  Location: [facility] ENDOSCOPY;  Service: Pulmonary;;   IR IMAGING GUIDED PORT INSERTION  05/25/2022   LAPAROTOMY N/A 08/08/2021   Procedure: EXPLORATORY LAPAROTOMY;  Surgeon: Matthew Mitzie LABOR, MD;  Location: [facility] OR;  Service: General;  Laterality: N/A;   PARTIAL COLECTOMY N/A 08/08/2021   Procedure: PARTIAL COLECTOMY WITH COLOSTOMY;  Surgeon: Matthew Mitzie LABOR, MD;  Location: [facility] OR;  Service: General;  Laterality: N/A;   VIDEO  BRONCHOSCOPY WITH RADIAL ENDOBRONCHIAL ULTRASOUND  05/11/2022   Procedure: VIDEO BRONCHOSCOPY WITH RADIAL ENDOBRONCHIAL ULTRASOUND;  Surgeon: Shelah Lamar RAMAN, MD;  Location: [facility] ENDOSCOPY;  Service: Pulmonary;;    I have reviewed the social history and family history with the patient and they are unchanged from previous note.  ALLERGIES:  has no known allergies.  MEDICATIONS:  Current Outpatient Medications  Medication Sig Dispense Refill   acetaminophen  (TYLENOL ) 500 MG tablet Take 2 tablets (1,000 mg total) by mouth every 6 (six) hours as needed for mild pain or moderate pain.  0   apixaban  (ELIQUIS ) 5 MG TABS tablet Take 1 tablet (5 mg total) by mouth 2 (two) times daily. 60 tablet 2   aspirin EC 81 MG tablet Take 81 mg by mouth daily as needed (for pain or headaches).      benztropine  (COGENTIN ) 1 MG tablet Take 1 mg at bedtime by mouth.     clindamycin  (CLINDAGEL) 1 % gel Apply topically 2 (two) times daily. To skin rash on face and upper body 30 g 0   ferrous sulfate  325 (65 FE) MG EC tablet Take 1 tablet (325 mg total) by mouth daily. 30 tablet 3   hydrocortisone  cream 1 % Apply 1 Application topically 2 (two) times daily as needed for itching. For rash 30 g 2   loperamide  (IMODIUM ) 2 MG capsule Take 1 capsule (2 mg total) by mouth as needed for diarrhea or loose stools. 30 capsule 0   magnesium  oxide (MAG-OX) 400 (240 Mg) MG tablet Take 1 tablet (400 mg total) by mouth 2 (two) times daily. 30 tablet 2   Multiple Vitamin (MULTIVITAMIN WITH MINERALS) TABS tablet Take 1 tablet by mouth daily.     ondansetron  (ZOFRAN ) 8 MG tablet Take 1 tablet (  8 mg total) by mouth every 8 (eight) hours as needed for nausea or vomiting. 30 tablet 0   paliperidone  (INVEGA  SUSTENNA) 156 MG/ML SUSP injection Inject 156 mg every 30 (thirty) days into the muscle.     polyethylene glycol (MIRALAX  / GLYCOLAX ) 17 g packet Take 17 g by mouth daily as needed for mild constipation or moderate constipation.  0    potassium chloride  SA (KLOR-CON  M) 20 MEQ tablet Take 1 tablet (20 mEq total) by mouth daily. 30 tablet 2   prochlorperazine  (COMPAZINE ) 10 MG tablet TAKE 1 TABLET(10 MG) BY MOUTH EVERY 6 HOURS AS NEEDED FOR NAUSEA OR VOMITING 30 tablet 0   No current facility-administered medications for this visit.   Facility-Administered Medications Ordered in Other Visits  Medication Dose Route Frequency Provider Last Rate Last Admin   fluorouracil  (ADRUCIL ) 4,350 mg in sodium chloride  0.9 % 63 mL chemo infusion  2,400 mg/m2 (Treatment Plan Recorded) Intravenous 1 day or 1 dose Matthew Callander, MD   Infusion Verify at 04/24/24 1501    PHYSICAL EXAMINATION: ECOG PERFORMANCE STATUS: 1 - Symptomatic but completely ambulatory  Vitals:   04/24/24 0841  BP: 100/80  Pulse: 96  Resp: 16  Temp: 97.6 F (36.4 C)  SpO2: 100%   Wt Readings from Last 3 Encounters:  04/24/24 142 lb 11.2 oz (64.7 kg)  04/10/24 142 lb 9.6 oz (64.7 kg)  03/27/24 145 lb 4.8 oz (65.9 kg)     GENERAL:alert, no distress and comfortable SKIN: skin color, texture, turgor are normal, no rashes or significant lesions EYES: normal, Conjunctiva are pink and non-injected, sclera clear NECK: supple, thyroid normal size, non-tender, without nodularity LYMPH:  no palpable lymphadenopathy in the cervical, axillary  LUNGS: clear to auscultation and percussion with normal breathing effort HEART: regular rate & rhythm and no murmurs and no lower extremity edema ABDOMEN:abdomen soft, non-tender and normal bowel sounds Musculoskeletal:no cyanosis of digits and no clubbing  NEURO: alert & oriented x 3 with fluent speech, no focal motor/sensory deficits  Physical Exam    LABORATORY DATA:  I have reviewed the data as listed    Latest Ref Rng & Units 04/24/2024    8:08 AM 04/10/2024    9:53 AM 03/27/2024   10:29 AM  CBC  WBC 4.0 - 10.5 K/uL 3.6  3.3  4.5   Hemoglobin 13.0 - 17.0 g/dL 87.9  87.6  87.9   Hematocrit 39.0 - 52.0 % 36.5  36.9  36.6    Platelets 150 - 400 K/uL 346  358  356         Latest Ref Rng & Units 04/24/2024    8:08 AM 04/10/2024    9:53 AM 03/27/2024   10:29 AM  CMP  Glucose 70 - 99 mg/dL 95  888  899   BUN 6 - 20 mg/dL 10  10  6    Creatinine 0.61 - 1.24 mg/dL 9.33  9.33  9.31   Sodium 135 - 145 mmol/L 139  137  137   Potassium 3.5 - 5.1 mmol/L 3.8  3.4  3.6   Chloride 98 - 111 mmol/L 109  105  106   CO2 22 - 32 mmol/L 24  24  25    Calcium  8.9 - 10.3 mg/dL 8.8  9.2  8.9   Total Protein 6.5 - 8.1 g/dL 6.3  6.6  6.2   Total Bilirubin 0.0 - 1.2 mg/dL 0.6  0.6  0.5   Alkaline Phos 38 - 126 U/L 101  86  107   AST 15 - 41 U/L 12  11  13    ALT 0 - 44 U/L 9  9  14        RADIOGRAPHIC STUDIES: I have personally reviewed the radiological images as listed and agreed with the findings in the report. No results found.    No orders of the defined types were placed in this encounter.  All questions were answered. The patient knows to call the clinic with any problems, questions or concerns. No barriers to learning was detected. The total time spent in the appointment was 25 minutes, including review of chart and various tests results, discussions about plan of care and coordination of care plan     Onita Mattock, MD 04/24/2024

## 2024-04-24 NOTE — Assessment & Plan Note (Signed)
 stage IIIB p(T3, N1aM0), MSS, KRAS/NRAS/BRAF wild type, lung and node metastasis in 04/2022  -Initially diagnosed in 08/2021 -he completed 4 cycles adjuvant CAPOX 10/10/21 - 12/25/21 (though he somehow was still taking Xeloda  through ~02/09/22 due to misunderstandings).  -He had a metastatic recurrence in the lungs and retroperitoneal adenopathy in September 2023 -he started FOLFIRI on 05/08/2022, Vectibix  added from cycle 2, tolerating well overall -restaging CT scan image from August 02, 2022 and 10/28/2022 both showed good partial response in pulmonary metastasis, no other new lesions. -we have changed his treatment to maintenance therapy with 5-fu and vectibix  in late March 2024 -He is tolerating treatment very well, will continue. -due to metastasis in both lungs and retroperitoneal lymph nodes, he is not a candidate for surgery or local therapy  -restaging CT 10/06/2023 showed stable disease overall except increased size of the pulmonary nodule in the lingula now measuring 7 mm Will continue current therapy. - Restaging CT scan on May 29th 2025 showed mild disease progression in the lung, I changed his chemotherapy back to FOLFIRI and Vectibix  on 01/03/2024 -CT 04/04/2024 showed stable disease, will continue current therapy

## 2024-04-24 NOTE — Patient Instructions (Signed)
 [facility] - A DEPT OF [place] [facility]   Discharge Instructions: Thank you for choosing [facility] to provide your oncology and hematology care.   If you have a lab appointment with the Cancer Center, please go directly to the Cancer Center and check in at the registration area.   Wear comfortable clothing and clothing appropriate for easy access to any Portacath or PICC line.   We strive to give you quality time with your provider. You may need to reschedule your appointment if you arrive late (15 or more minutes).  Arriving late affects you and other patients whose appointments are after yours.  Also, if you miss three or more appointments without notifying the office, you may be dismissed from the clinic at the provider's discretion.      For prescription refill requests, have your pharmacy contact our office and allow 72 hours for refills to be completed.    Today you received the following chemotherapy and/or immunotherapy agents: Panitumumab  (Vectibix ), Irinotecan , Leucovorin , Fluorouracil  (Adrucil )      To help prevent nausea and vomiting after your treatment, we encourage you to take your nausea medication as directed.  BELOW ARE SYMPTOMS THAT SHOULD BE REPORTED IMMEDIATELY: *FEVER GREATER THAN 100.4 F (38 C) OR HIGHER *CHILLS OR SWEATING *NAUSEA AND VOMITING THAT IS NOT CONTROLLED WITH YOUR NAUSEA MEDICATION *UNUSUAL SHORTNESS OF BREATH *UNUSUAL BRUISING OR BLEEDING *URINARY PROBLEMS (pain or burning when urinating, or frequent urination) *BOWEL PROBLEMS (unusual diarrhea, constipation, pain near the anus) TENDERNESS IN MOUTH AND THROAT WITH OR WITHOUT PRESENCE OF ULCERS (sore throat, sores in mouth, or a toothache) UNUSUAL RASH, SWELLING OR PAIN  UNUSUAL VAGINAL DISCHARGE OR ITCHING   Items with * indicate a potential emergency and should be followed up as soon as possible or go to the Emergency Department if any problems should  occur.  Please show the CHEMOTHERAPY ALERT CARD or IMMUNOTHERAPY ALERT CARD at check-in to the Emergency Department and triage nurse.  Should you have questions after your visit or need to cancel or reschedule your appointment, please contact [facility]  Dept: [phone]  and follow the prompts.  Office hours are 8:00 a.m. to 4:30 p.m. Monday - Friday. Please note that voicemails left after 4:00 p.m. may not be returned until the following business day.  We are closed weekends and major holidays. You have access to a nurse at all times for urgent questions. Please call the main number to the clinic Dept: [phone] and follow the prompts.   For any non-urgent questions, you may also contact your provider using MyChart. We now offer e-Visits for anyone 17 and older to request care online for non-urgent symptoms. For details visit [email or web address].   Also download the MyChart app! Go to the app store, search MyChart, open the app, select [facility], and log in with your MyChart username and password.  The chemotherapy medication bag should finish at 46 hours, 96 hours, or 7 days. For example, if your pump is scheduled for 46 hours and it was put on at 4:00 p.m., it should finish at 2:00 p.m. the day it is scheduled to come off regardless of your appointment time.     Estimated time to finish at   If the display on your pump reads Low Volume and it is beeping, take the batteries out of the pump and come to the  cancer center for it to be taken off.   If the pump alarms go off prior to the pump reading Low Volume then call [phone] and someone can assist you.  If the plunger comes out and the chemotherapy medication is leaking out, please use your home chemo spill kit to clean up the spill. Do NOT use paper towels or other household products.  If you have problems or questions regarding your pump, please call either  [phone] (24 hours a day) or the cancer center Monday-Friday 8:00 a.m.- 4:30 p.m. at the clinic number and we will assist you. If you are unable to get assistance, then go to the nearest Emergency Department and ask the staff to contact the IV team for assistance.

## 2024-04-26 ENCOUNTER — Inpatient Hospital Stay: Payer: MEDICAID

## 2024-05-07 NOTE — Assessment & Plan Note (Signed)
 stage IIIB p(T3, N1aM0), MSS, KRAS/NRAS/BRAF wild type, lung and node metastasis in 04/2022  -Initially diagnosed in 08/2021 -he completed 4 cycles adjuvant CAPOX 10/10/21 - 12/25/21 (though he somehow was still taking Xeloda  through ~02/09/22 due to misunderstandings).  -He had a metastatic recurrence in the lungs and retroperitoneal adenopathy in September 2023 -he started FOLFIRI on 05/08/2022, Vectibix  added from cycle 2, tolerating well overall -restaging CT scan image from August 02, 2022 and 10/28/2022 both showed good partial response in pulmonary metastasis, no other new lesions. -we have changed his treatment to maintenance therapy with 5-fu and vectibix  in late March 2024 -He is tolerating treatment very well, will continue. -due to metastasis in both lungs and retroperitoneal lymph nodes, he is not a candidate for surgery or local therapy  -restaging CT 10/06/2023 showed stable disease overall except increased size of the pulmonary nodule in the lingula now measuring 7 mm Will continue current therapy. - Restaging CT scan on May 29th 2025 showed mild disease progression in the lung, I changed his chemotherapy back to FOLFIRI and Vectibix  on 01/03/2024 -CT 04/04/2024 showed stable disease, will continue current therapy

## 2024-05-08 ENCOUNTER — Inpatient Hospital Stay (HOSPITAL_BASED_OUTPATIENT_CLINIC_OR_DEPARTMENT_OTHER): Payer: MEDICAID | Admitting: Hematology

## 2024-05-08 ENCOUNTER — Inpatient Hospital Stay: Payer: MEDICAID

## 2024-05-08 ENCOUNTER — Inpatient Hospital Stay: Payer: MEDICAID | Attending: Hematology

## 2024-05-08 ENCOUNTER — Other Ambulatory Visit: Payer: Self-pay

## 2024-05-08 VITALS — BP 98/70 | HR 105 | Temp 98.0°F | Resp 17 | Ht 69.0 in | Wt 139.7 lb

## 2024-05-08 VITALS — BP 94/62 | HR 86 | Resp 16

## 2024-05-08 DIAGNOSIS — C772 Secondary and unspecified malignant neoplasm of intra-abdominal lymph nodes: Secondary | ICD-10-CM | POA: Diagnosis not present

## 2024-05-08 DIAGNOSIS — Z79634 Long term (current) use of topoisomerase inhibitor: Secondary | ICD-10-CM | POA: Insufficient documentation

## 2024-05-08 DIAGNOSIS — C7802 Secondary malignant neoplasm of left lung: Secondary | ICD-10-CM | POA: Diagnosis not present

## 2024-05-08 DIAGNOSIS — C186 Malignant neoplasm of descending colon: Secondary | ICD-10-CM

## 2024-05-08 DIAGNOSIS — Z5112 Encounter for antineoplastic immunotherapy: Secondary | ICD-10-CM | POA: Diagnosis present

## 2024-05-08 DIAGNOSIS — Z452 Encounter for adjustment and management of vascular access device: Secondary | ICD-10-CM | POA: Diagnosis not present

## 2024-05-08 DIAGNOSIS — Z79631 Long term (current) use of antimetabolite agent: Secondary | ICD-10-CM | POA: Insufficient documentation

## 2024-05-08 DIAGNOSIS — C7801 Secondary malignant neoplasm of right lung: Secondary | ICD-10-CM | POA: Insufficient documentation

## 2024-05-08 DIAGNOSIS — Z5111 Encounter for antineoplastic chemotherapy: Secondary | ICD-10-CM | POA: Insufficient documentation

## 2024-05-08 DIAGNOSIS — Z79899 Other long term (current) drug therapy: Secondary | ICD-10-CM | POA: Diagnosis not present

## 2024-05-08 LAB — CMP (CANCER CENTER ONLY)
ALT: 8 U/L (ref 0–44)
AST: 12 U/L — ABNORMAL LOW (ref 15–41)
Albumin: 3.9 g/dL (ref 3.5–5.0)
Alkaline Phosphatase: 93 U/L (ref 38–126)
Anion gap: 7 (ref 5–15)
BUN: 14 mg/dL (ref 6–20)
CO2: 24 mmol/L (ref 22–32)
Calcium: 9.5 mg/dL (ref 8.9–10.3)
Chloride: 109 mmol/L (ref 98–111)
Creatinine: 0.73 mg/dL (ref 0.61–1.24)
GFR, Estimated: >60 mL/min (ref >60)
Glucose, Bld: 94 mg/dL (ref 70–99)
Potassium: 3.5 mmol/L (ref 3.5–5.1)
Sodium: 140 mmol/L (ref 135–145)
Total Bilirubin: 0.7 mg/dL (ref 0.0–1.2)
Total Protein: 6.5 g/dL (ref 6.5–8.1)

## 2024-05-08 LAB — CBC WITH DIFFERENTIAL (CANCER CENTER ONLY)
Abs Immature Granulocytes: 0.01 K/uL (ref 0.00–0.07)
Basophils Absolute: 0 K/uL (ref 0.0–0.1)
Basophils Relative: 1 %
Eosinophils Absolute: 0.1 K/uL (ref 0.0–0.5)
Eosinophils Relative: 3 %
HCT: 35.8 % — ABNORMAL LOW (ref 39.0–52.0)
Hemoglobin: 12 g/dL — ABNORMAL LOW (ref 13.0–17.0)
Immature Granulocytes: 0 %
Lymphocytes Relative: 19 %
Lymphs Abs: 0.8 K/uL (ref 0.7–4.0)
MCH: 27.8 pg (ref 26.0–34.0)
MCHC: 33.5 g/dL (ref 30.0–36.0)
MCV: 82.9 fL (ref 80.0–100.0)
Monocytes Absolute: 0.3 K/uL (ref 0.1–1.0)
Monocytes Relative: 8 %
Neutro Abs: 2.8 K/uL (ref 1.7–7.7)
Neutrophils Relative %: 69 %
Platelet Count: 322 K/uL (ref 150–400)
RBC: 4.32 MIL/uL (ref 4.22–5.81)
RDW: 16.2 % — ABNORMAL HIGH (ref 11.5–15.5)
WBC Count: 4.1 K/uL (ref 4.0–10.5)
nRBC: 0 % (ref 0.0–0.2)

## 2024-05-08 LAB — MAGNESIUM: Magnesium: 1.6 mg/dL — ABNORMAL LOW (ref 1.7–2.4)

## 2024-05-08 MED ORDER — SODIUM CHLORIDE 0.9 % IV SOLN: Freq: Once | INTRAVENOUS | Status: AC

## 2024-05-08 MED ORDER — ATROPINE SULFATE 1 MG/ML IV SOLN
0.5000 mg | Freq: Once | INTRAVENOUS | Status: AC | PRN
  Administered 2024-05-08: 0.5 mg via INTRAVENOUS
  Filled 2024-05-08: qty 1

## 2024-05-08 MED ORDER — SODIUM CHLORIDE 0.9 % IV SOLN
2400.0000 mg/m2 | INTRAVENOUS | Status: DC
  Administered 2024-05-08: 4350 mg via INTRAVENOUS
  Filled 2024-05-08: qty 87

## 2024-05-08 MED ORDER — SODIUM CHLORIDE 0.9 % IV SOLN
180.0000 mg/m2 | Freq: Once | INTRAVENOUS | Status: AC
  Administered 2024-05-08: 320 mg via INTRAVENOUS
  Filled 2024-05-08: qty 15

## 2024-05-08 MED ORDER — PALONOSETRON HCL INJECTION 0.25 MG/5ML
0.2500 mg | Freq: Once | INTRAVENOUS | Status: AC
  Administered 2024-05-08: 0.25 mg via INTRAVENOUS
  Filled 2024-05-08: qty 5

## 2024-05-08 MED ORDER — SODIUM CHLORIDE 0.9 % IV SOLN
400.0000 mg/m2 | Freq: Once | INTRAVENOUS | Status: AC
  Administered 2024-05-08: 728 mg via INTRAVENOUS
  Filled 2024-05-08: qty 36.4

## 2024-05-08 MED ORDER — SODIUM CHLORIDE 0.9 % IV SOLN
6.0000 mg/kg | Freq: Once | INTRAVENOUS | Status: AC
  Administered 2024-05-08: 400 mg via INTRAVENOUS
  Filled 2024-05-08: qty 20

## 2024-05-08 MED ORDER — DEXAMETHASONE SODIUM PHOSPHATE 10 MG/ML IJ SOLN
10.0000 mg | Freq: Once | INTRAMUSCULAR | Status: AC
  Administered 2024-05-08: 10 mg via INTRAVENOUS
  Filled 2024-05-08: qty 1

## 2024-05-08 MED ORDER — MAGNESIUM SULFATE 2 GM/50ML IV SOLN
2.0000 g | Freq: Once | INTRAVENOUS | Status: AC
  Administered 2024-05-08: 2 g via INTRAVENOUS
  Filled 2024-05-08: qty 50

## 2024-05-08 NOTE — Patient Instructions (Signed)
 [facility] - A DEPT OF [facility]  Discharge Instructions: Thank you for choosing [facility] to provide your oncology and hematology care.   If you have a lab appointment with the Cancer Center, please go directly to the Cancer Center and check in at the registration area.   Wear comfortable clothing and clothing appropriate for easy access to any Portacath or PICC line.   We strive to give you quality time with your provider. You may need to reschedule your appointment if you arrive late (15 or more minutes).  Arriving late affects you and other patients whose appointments are after yours.  Also, if you miss three or more appointments without notifying the office, you may be dismissed from the clinic at the provider's discretion.      For prescription refill requests, have your pharmacy contact our office and allow 72 hours for refills to be completed.    Today you received the following chemotherapy and/or immunotherapy agents oxaliplatin, leucovorin, fluorourcil      To help prevent nausea and vomiting after your treatment, we encourage you to take your nausea medication as directed.  BELOW ARE SYMPTOMS THAT SHOULD BE REPORTED IMMEDIATELY: *FEVER GREATER THAN 100.4 F (38 C) OR HIGHER *CHILLS OR SWEATING *NAUSEA AND VOMITING THAT IS NOT CONTROLLED WITH YOUR NAUSEA MEDICATION *UNUSUAL SHORTNESS OF BREATH *UNUSUAL BRUISING OR BLEEDING *URINARY PROBLEMS (pain or burning when urinating, or frequent urination) *BOWEL PROBLEMS (unusual diarrhea, constipation, pain near the anus) TENDERNESS IN MOUTH AND THROAT WITH OR WITHOUT PRESENCE OF ULCERS (sore throat, sores in mouth, or a toothache) UNUSUAL RASH, SWELLING OR PAIN  UNUSUAL VAGINAL DISCHARGE OR ITCHING   Items with * indicate a potential emergency and should be followed up as soon as possible or go to the Emergency Department if any problems should occur.  Please show the CHEMOTHERAPY  ALERT CARD or IMMUNOTHERAPY ALERT CARD at check-in to the Emergency Department and triage nurse.  Should you have questions after your visit or need to cancel or reschedule your appointment, please contact [facility] - A DEPT OF [facility]  Dept: [phone]  and follow the prompts.  Office hours are 8:00 a.m. to 4:30 p.m. Monday - Friday. Please note that voicemails left after 4:00 p.m. may not be returned until the following business day.  We are closed weekends and major holidays. You have access to a nurse at all times for urgent questions. Please call the main number to the clinic Dept: [phone] and follow the prompts.   For any non-urgent questions, you may also contact your provider using MyChart. We now offer e-Visits for anyone 19 and older to request care online for non-urgent symptoms. For details visit [email or web address].   Also download the MyChart app! Go to the app store, search "MyChart", open the app, select , and log in with your MyChart username and password.

## 2024-05-08 NOTE — Progress Notes (Signed)
 Mg 1.6 - Mg 2g IV already ordered per the treatment plan but MD would like to give 4g. Not enough time to give an additional 2g of mag today per charge RN. Will give 2g of mag on pump d/c day.  Harlene Nasuti, PharmD Oncology Infusion Pharmacist 05/08/2024 1:11 PM

## 2024-05-08 NOTE — Progress Notes (Signed)
 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  -He had a metastatic recurrence in the lungs and retroperitoneal adenopathy in September 2023 -he started FOLFIRI on 05/08/2022, Vectibix  added from cycle 2, tolerating well overall -restaging CT scan image from August 02, 2022 and 10/28/2022 both showed good partial response in pulmonary metastasis, no other new lesions. -we have changed his treatment to maintenance therapy with 5-fu and vectibix  in late March 2024 -He is tolerating treatment very well, will continue. -due to metastasis in both lungs and retroperitoneal lymph nodes, he is not a candidate for surgery or local therapy  -restaging CT 10/06/2023 showed stable disease overall except increased size of the pulmonary nodule in the lingula now measuring 7 mm Will continue current therapy. - Restaging CT scan on May 29th 2025 showed mild disease progression in the lung, I changed his chemotherapy back to FOLFIRI and Vectibix  on 01/03/2024 -CT 04/04/2024 showed stable disease, will continue current therapy   Assessment & Plan Malignant neoplasm of descending colon The malignant neoplasm of the descending colon is responding well to the current chemotherapy regimen, which includes two chemotherapy drugs and one  antibody. He is tolerating the regimen well without significant adverse effects. Blood counts are stable, and kidney and liver functions are normal. Magnesium  levels were previously normal. - Continue current chemotherapy regimen with two chemotherapy drugs and one antibody. - Continue oral magnesium  supplementation. - Schedule next two chemotherapy cycles.  Chemotherapy-induced diarrhea He experiences loose bowel movements, likely due to chemotherapy. - Use Imodium  if experiencing loose bowel movements more than three times a day.  Plan - He is clinically doing well, no complaints.  Lab reviewed, adequate for treatment, will proceed to chemo today and continue every 2 weeks - Follow-up in 2 weeks   SUMMARY OF ONCOLOGIC HISTORY: Oncology History Overview Note   Cancer Staging  Cancer of left colon [facility]) Staging form: Colon and Rectum, AJCC 8th Edition - Pathologic stage from 08/08/2021: Stage IIIB (pT3, pN1a, cM0) - Signed by [name], MD on 08/26/2021    Cancer of left colon (HCC)  04/01/2020 Imaging   IMPRESSION: 1. Gallbladder decompressed bowel also partially calcified gallstones. No pericholecystic inflammation though if there is persisting clinical concern for cholecystitis right upper quadrant ultrasound could be obtained. 2. Circumferential thickening of the distal thoracic esophagus. Could reflect features of esophagitis. Correlate with clinical symptoms and consider endoscopy as clinically warranted. 3. Additional segmental thickening of the mid to distal sigmoid with focal narrowing. No acute surrounding inflammation or resulting obstruction. Findings are nonspecific, and could reflect sequela of prior inflammation/infection. However, recommend correlation with colonoscopy if not recently performed. 4. Mild circumferential bladder wall thickening and indentation of the bladder base by an enlarged prostate. Possibly sequela of chronic outlet obstruction though could  correlate with urinalysis to exclude cystitis. 5. Aortic Atherosclerosis (ICD10-I70.0).  08/06/2021 Imaging   IMPRESSION: Sigmoid colonic perforation with small free intraperitoneal gas and infiltration of the mesenteric and omental fat in keeping with changes of peritonitis.   Long segment inflammatory stranding of the sigmoid colon in keeping with a severe infectious or inflammatory colitis. This terminates an area of irregular mural thickening, infiltrative soft tissue within the colonic mesentery, and focal dystrophic calcification. This may represent a chronic inflammatory process, however, a perforated malignancy could appear similarly. There are 2 separate points of perforation which again raise the question of an underlying malignancy.   7.1 cm gas and fluid containing pericolonic abscess within the sigmoid mesentery.   Marked inflammatory change of the terminal ileum adjacent to the pericolonic abscess with resultant small bowel obstruction. Fluid within the distal esophagus likely relates to gastroesophageal reflux the setting of vomiting.   Aortic Atherosclerosis (ICD10-I70.0).   08/08/2021 Cancer Staging   Staging form: Colon and Rectum, AJCC 8th Edition - Pathologic stage from 08/08/2021: Stage IIIB (pT3, pN1a, cM0) - Signed by [name], MD on 08/26/2021 Stage prefix: Initial diagnosis Total positive nodes: 1 Histologic grading system: 4 grade system Histologic grade (G): G2 Residual tumor (R): R0 - None   08/08/2021 Definitive Surgery   FINAL MICROSCOPIC DIAGNOSIS:   A. COLON, SIGMOID, PARTIAL COLECTOMY:  - Invasive moderately differentiated adenocarcinoma.  - Metastatic carcinoma involving one of twelve lymph nodes (1/12).  - See oncology table below.   ADDENDUM:  Mismatch Repair Protein (IHC)  SUMMARY INTERPRETATION: NORMAL    08/14/2021 Imaging   EXAM: CT ABDOMEN AND PELVIS WITH CONTRAST  IMPRESSION: 1. Post recent sigmoid colectomy with left lower quadrant  colostomy. Two small residual foci of air within the pelvic mesentery with mild adjacent thickening, but no abscess or drainable collection. Trace non organized free fluid and stranding in the pelvis. 2. Short segment of small bowel wall thickening and inflammation in the pelvis involving the distal ileum, likely reactive. 3. Dilated distal esophagus, stomach, and small bowel, without discrete transition point, favoring postoperative ileus. 4. Small bilateral pleural effusions and compressive atelectasis. 5. Heterogeneous partially enhancing 14 mm lymph node in the retroperitoneum at the aortoiliac bifurcation, not significantly changed from prior exam. Suspected additional lymph nodes in the anterior common iliac space, not significantly changed from prior exam. Recommend attention at follow-up. 6. Additional chronic findings as described.     08/15/2021 Imaging   EXAM: CT CHEST WITH CONTRAST  IMPRESSION: 1. No evidence of thoracic metastasis. 2. Bilateral small layering pleural effusions with passive atelectasis   08/26/2021 Initial Diagnosis   Cancer of left colon (HCC)   10/10/2021 - 12/12/2021 Chemotherapy   Patient is on Treatment Plan : COLORECTAL Xelox (Capeox) q21d     05/28/2022 - 05/28/2022 Chemotherapy   Patient is on Treatment Plan : COLORECTAL FOLFIRI + Bevacizumab q14d     05/28/2022 -  Chemotherapy   Patient is on Treatment Plan : COLORECTAL FOLFIRI + Panitumumab  q14d     07/31/2022 Imaging    IMPRESSION: Decreased bilateral pulmonary metastases.   Stable mild abdominal lymphadenopathy.   No new or progressive metastatic disease within the chest, abdomen, or pelvis.   01/18/2023 Imaging    IMPRESSION: 1. Status post Hetty pouch sigmoid colon resection with left lower quadrant end colostomy and rectal stump. Unchanged, mild wall thickening of the decompressed distal colon at the ileostomy, as well as the rectal stump. 2. Unchanged small, treated  metastatic pulmonary nodules. No new nodules. 3. Unchanged enlarged, coarsely calcified treated lower aortocaval  metastatic lymph nodes. No new lymphadenopathy. 4. Coronary artery disease. 5. Cholelithiasis.   04/23/2023 Imaging   CT chest, abdomen, and pelvis with contrast  IMPRESSION: 1. Left lower lobe segmental pulmonary emboli, incidental finding. No evidence for right heart strain. 2. Stable trace bilateral pleural effusions. 3. Stable enlarged lower left paraesophageal lymph node. 4. Stable calcified retroperitoneal lymph nodes. No new enlarged lymph nodes in the chest, abdomen or pelvis. 5. Stable subcentimeter soft tissue nodules within the mesenteric fat along the inferior ostomy. 6. Left Bosniak I benign renal cyst measuring 4.6 cm. No follow-up imaging is recommended. JACR 2018 Feb; 264-273, Management of the Incidental Renal Mass on CT, [employer] 2021; 814-848, Bosniak Classification of Cystic Renal Masses, Version 2019.   07/15/2023 Imaging   CT chest abdomen and pelvis with contrast IMPRESSION: 1. Prior partial left hemicolectomy with end colostomy in the left anterior abdominal wall. Unchanged mild wall thickening of the decompressed distal colon at the ileostomy as well as the rectal stump/Hartmann's pouch. 2. Stable enlarged left lower paraesophageal lymph node. 3. Increased size of the treated pulmonary nodule in the lingula now measuring 6 mm previously 4 mm with increased in size favored artifactual given motion degrading examination of the lung bases. Suggest attention on follow-up imaging 4. Unchanged size of the treated pulmonary nodule in the right middle lobe measuring 4 mm. 5. Increased size of prominent bilateral axillary lymph nodes measuring up to 7 mm in short axis, nonspecific. Suggest attention on follow-up imaging 6. No convincing evidence of new or progressive disease in the chest, abdomen or pelvis. 7.  Aortic Atherosclerosis (ICD10-I70.0).      10/06/2023 Imaging   CT chest abdomen pelvis with contrast IMPRESSION: 1. Increased size of the pulmonary nodule in the lingula now measuring 7 mm, concerning for pulmonary metastatic disease recurrence. 2. Additional bilateral pulmonary nodules are stable. 3. Stable prominent bilateral axillary, low paraesophageal and retroperitoneal lymph nodes. 4. Prior partial left hemicolectomy with Hartmann's pouch formation and left anterior abdominal wall colostomy. No suspicious nodularity along the Hartmann's suture line. 5. Patulous esophagus with symmetric distal esophageal wall thickening, similar prior. 6. Trace right pleural effusion. 7.  Aortic Atherosclerosis (ICD10-I70.0).     12/30/2023 Imaging   CT chest, abdomen, and pelvis with contrast IMPRESSION: 1. Increased size of bilateral pulmonary nodules with new 4 mm left upper lobe and 5 mm superior segment right lower lobe pulmonary nodules, compatible with worsening pulmonary metastatic disease. 2. Stable prominent bilateral axillary and mediastinal lymph nodes. 3. Stable coarsely calcified retroperitoneal lymph nodes measuring up to 10 mm in short axis. No new pathologically enlarged abdominal or pelvic lymph nodes. 4. Stable trace right pleural effusion. 5. Prior partial left hemicolectomy with Hartmann's pouch formation and left anterior abdominal wall colostomy. No new suspicious nodularity along the suture line.      Discussed the use of AI scribe software for clinical note transcription with the patient, who gave verbal consent to proceed.  History of Present Illness Matthew [name] is a 58 year old male with colon cancer who presents for follow-up.  He is undergoing a chemotherapy regimen that includes two chemotherapy drugs and one antibody. He takes oral magnesium , three tablets, to maintain his magnesium  levels, which were stable at the last check.  He experiences diarrhea and has Imodium  available for use if loose bowel  movements occur more than three times a day. His weight has decreased from 142 pounds to 140 pounds, and he is monitoring his weight at home.  There are no mouth sores or bleeding with bowel movements. He consumes juices, which may contribute to diarrhea, but he is not diabetic.  He does not experience significant sickness or fatigue.     All other systems were reviewed with the patient and are negative.  MEDICAL HISTORY:  Past Medical History:  Diagnosis Date   Cancer (HCC)    Schizophrenia [facility])     SURGICAL HISTORY: Past Surgical History:  Procedure Laterality Date   BRONCHIAL BIOPSY  05/11/2022   Procedure: BRONCHIAL BIOPSIES;  Surgeon: Shelah Lamar RAMAN, MD;  Location: [facility] ENDOSCOPY;  Service: Pulmonary;;   BRONCHIAL BRUSHINGS  05/11/2022   Procedure: BRONCHIAL BRUSHINGS;  Surgeon: Shelah Lamar RAMAN, MD;  Location: [facility] ENDOSCOPY;  Service: Pulmonary;;   BRONCHIAL NEEDLE ASPIRATION BIOPSY  05/11/2022   Procedure: BRONCHIAL NEEDLE ASPIRATION BIOPSIES;  Surgeon: Shelah Lamar RAMAN, MD;  Location: [facility] ENDOSCOPY;  Service: Pulmonary;;   BRONCHIAL WASHINGS  05/11/2022   Procedure: BRONCHIAL WASHINGS;  Surgeon: Shelah Lamar RAMAN, MD;  Location: [facility] ENDOSCOPY;  Service: Pulmonary;;   IR IMAGING GUIDED PORT INSERTION  05/25/2022   LAPAROTOMY N/A 08/08/2021   Procedure: EXPLORATORY LAPAROTOMY;  Surgeon: [name], MD;  Location: [facility] OR;  Service: General;  Laterality: N/A;   PARTIAL COLECTOMY N/A 08/08/2021   Procedure: PARTIAL COLECTOMY WITH COLOSTOMY;  Surgeon: [name], MD;  Location: [facility] OR;  Service: General;  Laterality: N/A;   VIDEO BRONCHOSCOPY WITH RADIAL ENDOBRONCHIAL ULTRASOUND  05/11/2022   Procedure: VIDEO BRONCHOSCOPY WITH RADIAL ENDOBRONCHIAL ULTRASOUND;  Surgeon: Shelah Lamar RAMAN, MD;  Location: [facility] ENDOSCOPY;  Service: Pulmonary;;    I have reviewed the social history and family history with the patient and they are unchanged from previous note.  ALLERGIES:  has no known  allergies.  MEDICATIONS:  Current Outpatient Medications  Medication Sig Dispense Refill   acetaminophen  (TYLENOL ) 500 MG tablet Take 2 tablets (1,000 mg total) by mouth every 6 (six) hours as needed for mild pain or moderate pain.  0   apixaban  (ELIQUIS ) 5 MG TABS tablet Take 1 tablet (5 mg total) by mouth 2 (two) times daily. 60 tablet 2   aspirin EC 81 MG tablet Take 81 mg by mouth daily as needed (for pain or headaches).      benztropine  (COGENTIN ) 1 MG tablet Take 1 mg at bedtime by mouth.     clindamycin  (CLINDAGEL) 1 % gel Apply topically 2 (two) times daily. To skin rash on face and upper body 30 g 0   ferrous sulfate  325 (65 FE) MG EC tablet Take 1 tablet (325 mg total) by mouth daily. 30 tablet 3   hydrocortisone  cream 1 % Apply 1 Application topically 2 (two) times daily as needed for itching. For rash 30 g 2   loperamide  (IMODIUM ) 2 MG capsule Take 1 capsule (2 mg total) by mouth as needed for diarrhea or loose stools. 30 capsule 0   magnesium  oxide (MAG-OX) 400 (240 Mg) MG tablet Take 1 tablet (400 mg total) by mouth 2 (two) times daily. 30 tablet 2   Multiple Vitamin (MULTIVITAMIN WITH MINERALS) TABS tablet Take 1 tablet by mouth daily.     ondansetron  (ZOFRAN ) 8 MG tablet Take 1 tablet (8 mg total) by mouth every 8 (eight) hours as needed for nausea or vomiting. 30 tablet 0   paliperidone  (INVEGA  SUSTENNA) 156 MG/ML SUSP injection Inject 156 mg every 30 (thirty) days into the muscle.     polyethylene glycol (MIRALAX  / GLYCOLAX ) 17 g  packet Take 17 g by mouth daily as needed for mild constipation or moderate constipation.  0   potassium chloride  SA (KLOR-CON  M) 20 MEQ tablet Take 1 tablet (20 mEq total) by mouth daily. 30 tablet 2   prochlorperazine  (COMPAZINE ) 10 MG tablet TAKE 1 TABLET(10 MG) BY MOUTH EVERY 6 HOURS AS NEEDED FOR NAUSEA OR VOMITING 30 tablet 0   No current facility-administered medications for this visit.   Facility-Administered Medications Ordered in Other  Visits  Medication Dose Route Frequency Provider Last Rate Last Admin   fluorouracil  (ADRUCIL ) 4,350 mg in sodium chloride  0.9 % 63 mL chemo infusion  2,400 mg/m2 (Treatment Plan Recorded) Intravenous 1 day or 1 dose [name], MD       irinotecan  (CAMPTOSAR ) 320 mg in sodium chloride  0.9 % 500 mL chemo infusion  180 mg/m2 (Treatment Plan Recorded) Intravenous Once [name], MD 344 mL/hr at 05/08/24 1459 320 mg at 05/08/24 1459   leucovorin  728 mg in sodium chloride  0.9 % 250 mL infusion  400 mg/m2 (Treatment Plan Recorded) Intravenous Once [name], MD 191 mL/hr at 05/08/24 1501 728 mg at 05/08/24 1501    PHYSICAL EXAMINATION: ECOG PERFORMANCE STATUS: 1 - Symptomatic but completely ambulatory  Vitals:   05/08/24 1140  BP: 98/70  Pulse: (!) 105  Resp: 17  Temp: 98 F (36.7 C)  SpO2: 99%   Wt Readings from Last 3 Encounters:  05/08/24 139 lb 11.2 oz (63.4 kg)  04/24/24 142 lb 11.2 oz (64.7 kg)  04/10/24 142 lb 9.6 oz (64.7 kg)     GENERAL:alert, no distress and comfortable SKIN: skin color, texture, turgor are normal, no rashes or significant lesions except minimal rash on face. EYES: normal, Conjunctiva are pink and non-injected, sclera clear NECK: supple, thyroid normal size, non-tender, without nodularity LYMPH:  no palpable lymphadenopathy in the cervical, axillary  LUNGS: clear to auscultation and percussion with normal breathing effort HEART: regular rate & rhythm and no murmurs and no lower extremity edema ABDOMEN:abdomen soft, non-tender and normal bowel sounds Musculoskeletal:no cyanosis of digits and no clubbing  NEURO: alert & oriented x 3 with fluent speech, no focal motor/sensory deficits  Physical Exam   LABORATORY DATA:  I have reviewed the data as listed    Latest Ref Rng & Units 05/08/2024   11:17 AM 04/24/2024    8:08 AM 04/10/2024    9:53 AM  CBC  WBC 4.0 - 10.5 K/uL 4.1  3.6  3.3   Hemoglobin 13.0 - 17.0 g/dL 87.9  87.9  87.6   Hematocrit 39.0 - 52.0  % 35.8  36.5  36.9   Platelets 150 - 400 K/uL 322  346  358         Latest Ref Rng & Units 05/08/2024   11:17 AM 04/24/2024    8:08 AM 04/10/2024    9:53 AM  CMP  Glucose 70 - 99 mg/dL 94  95  888   BUN 6 - 20 mg/dL 14  10  10    Creatinine 0.61 - 1.24 mg/dL 9.26  9.33  9.33   Sodium 135 - 145 mmol/L 140  139  137   Potassium 3.5 - 5.1 mmol/L 3.5  3.8  3.4   Chloride 98 - 111 mmol/L 109  109  105   CO2 22 - 32 mmol/L 24  24  24    Calcium  8.9 - 10.3 mg/dL 9.5  8.8  9.2   Total Protein 6.5 - 8.1 g/dL 6.5  6.3  6.6   Total Bilirubin 0.0 - 1.2 mg/dL 0.7  0.6  0.6   Alkaline Phos 38 - 126 U/L 93  101  86   AST 15 - 41 U/L 12  12  11    ALT 0 - 44 U/L 8  9  9        RADIOGRAPHIC STUDIES: I have personally reviewed the radiological images as listed and agreed with the findings in the report. No results found.    Orders Placed This Encounter  Procedures   CBC with Differential (Cancer Center Only)    Standing Status:   Future    Expected Date:   06/19/2024    Expiration Date:   06/19/2025   CMP (Cancer Center only)    Standing Status:   Future    Expected Date:   06/19/2024    Expiration Date:   06/20/2025   Magnesium     Standing Status:   Future    Expected Date:   06/19/2024    Expiration Date:   06/19/2025   CBC with Differential (Cancer Center Only)    Standing Status:   Future    Expected Date:   07/03/2024    Expiration Date:   07/03/2025   CMP (Cancer Center only)    Standing Status:   Future    Expected Date:   07/03/2024    Expiration Date:   07/04/2025   Magnesium     Standing Status:   Future    Expected Date:   07/03/2024    Expiration Date:   07/03/2025   All questions were answered. The patient knows to call the clinic with any problems, questions or concerns. No barriers to learning was detected. The total time spent in the appointment was 25 minutes, including review of chart and various tests results, discussions about plan of care and coordination of care  plan     Onita Mattock, MD 05/08/2024

## 2024-05-10 ENCOUNTER — Inpatient Hospital Stay: Payer: MEDICAID

## 2024-05-10 VITALS — BP 101/66 | HR 92 | Temp 98.1°F | Resp 17

## 2024-05-10 DIAGNOSIS — Z95828 Presence of other vascular implants and grafts: Secondary | ICD-10-CM

## 2024-05-10 DIAGNOSIS — C189 Malignant neoplasm of colon, unspecified: Secondary | ICD-10-CM

## 2024-05-10 DIAGNOSIS — Z5111 Encounter for antineoplastic chemotherapy: Secondary | ICD-10-CM | POA: Diagnosis not present

## 2024-05-10 MED ORDER — SODIUM CHLORIDE 0.9 % IV SOLN: INTRAVENOUS | Status: DC

## 2024-05-10 MED ORDER — MAGNESIUM SULFATE 2 GM/50ML IV SOLN
2.0000 g | Freq: Once | INTRAVENOUS | Status: AC
  Administered 2024-05-10: 2 g via INTRAVENOUS
  Filled 2024-05-10: qty 50

## 2024-05-10 NOTE — Patient Instructions (Signed)
 Magnesium Sulfate Injection What is this medication? MAGNESIUM SULFATE (mag NEE zee um SUL fate) prevents and treats low levels of magnesium in your body. It may also be used to prevent and treat seizures during pregnancy in people with high blood pressure disorders, such as preeclampsia or eclampsia. Magnesium plays an important role in maintaining the health of your muscles and nervous system. This medicine may be used for other purposes; ask your health care provider or pharmacist if you have questions. What should I tell my care team before I take this medication? They need to know if you have any of these conditions: Heart disease History of irregular heart beat Kidney disease An unusual or allergic reaction to magnesium sulfate, medications, foods, dyes, or preservatives Pregnant or trying to get pregnant Breast-feeding How should I use this medication? This medication is for infusion into a vein. It is given in a hospital or clinic setting. Talk to your care team about the use of this medication in children. While this medication may be prescribed for selected conditions, precautions do apply. Overdosage: If you think you have taken too much of this medicine contact a poison control center or emergency room at once. NOTE: This medicine is only for you. Do not share this medicine with others. What if I miss a dose? This does not apply. What may interact with this medication? Certain medications for anxiety or sleep Certain medications for seizures, such phenobarbital Digoxin Medications that relax muscles for surgery Narcotic medications for pain This list may not describe all possible interactions. Give your health care provider a list of all the medicines, herbs, non-prescription drugs, or dietary supplements you use. Also tell them if you smoke, drink alcohol, or use illegal drugs. Some items may interact with your medicine. What should I watch for while using this  medication? Your condition will be monitored carefully while you are receiving this medication. You may need blood work done while you are receiving this medication. What side effects may I notice from receiving this medication? Side effects that you should report to your care team as soon as possible: Allergic reactions--skin rash, itching, hives, swelling of the face, lips, tongue, or throat High magnesium level--confusion, drowsiness, facial flushing, redness, sweating, muscle weakness, fast or irregular heartbeat, trouble breathing Low blood pressure--dizziness, feeling faint or lightheaded, blurry vision Side effects that usually do not require medical attention (report to your care team if they continue or are bothersome): Headache Nausea This list may not describe all possible side effects. Call your doctor for medical advice about side effects. You may report side effects to FDA at 1-800-FDA-1088. Where should I keep my medication? This medication is given in a hospital or clinic and will not be stored at home. NOTE: This sheet is a summary. It may not cover all possible information. If you have questions about this medicine, talk to your doctor, pharmacist, or health care provider.  2024 Elsevier/Gold Standard (2021-04-02 00:00:00)

## 2024-05-22 ENCOUNTER — Other Ambulatory Visit: Payer: Self-pay

## 2024-05-22 ENCOUNTER — Inpatient Hospital Stay: Payer: MEDICAID | Admitting: Hematology

## 2024-05-22 ENCOUNTER — Encounter: Payer: Self-pay | Admitting: Hematology

## 2024-05-22 ENCOUNTER — Inpatient Hospital Stay: Payer: MEDICAID

## 2024-05-22 VITALS — BP 110/56 | HR 105 | Temp 98.6°F | Resp 16 | Ht 69.0 in | Wt 142.7 lb

## 2024-05-22 VITALS — HR 99

## 2024-05-22 DIAGNOSIS — C7800 Secondary malignant neoplasm of unspecified lung: Secondary | ICD-10-CM

## 2024-05-22 DIAGNOSIS — Z5111 Encounter for antineoplastic chemotherapy: Secondary | ICD-10-CM | POA: Diagnosis not present

## 2024-05-22 DIAGNOSIS — C186 Malignant neoplasm of descending colon: Secondary | ICD-10-CM

## 2024-05-22 DIAGNOSIS — Z95828 Presence of other vascular implants and grafts: Secondary | ICD-10-CM

## 2024-05-22 LAB — CMP (CANCER CENTER ONLY)
ALT: 14 U/L (ref 0–44)
AST: 17 U/L (ref 15–41)
Albumin: 3.6 g/dL (ref 3.5–5.0)
Alkaline Phosphatase: 91 U/L (ref 38–126)
Anion gap: 6 (ref 5–15)
BUN: 13 mg/dL (ref 6–20)
CO2: 24 mmol/L (ref 22–32)
Calcium: 9.1 mg/dL (ref 8.9–10.3)
Chloride: 108 mmol/L (ref 98–111)
Creatinine: 0.77 mg/dL (ref 0.61–1.24)
GFR, Estimated: >60 mL/min (ref >60)
Glucose, Bld: 102 mg/dL — ABNORMAL HIGH (ref 70–99)
Potassium: 3.7 mmol/L (ref 3.5–5.1)
Sodium: 138 mmol/L (ref 135–145)
Total Bilirubin: 0.6 mg/dL (ref 0.0–1.2)
Total Protein: 5.9 g/dL — ABNORMAL LOW (ref 6.5–8.1)

## 2024-05-22 LAB — CBC WITH DIFFERENTIAL (CANCER CENTER ONLY)
Abs Immature Granulocytes: 0.01 K/uL (ref 0.00–0.07)
Basophils Absolute: 0 K/uL (ref 0.0–0.1)
Basophils Relative: 1 %
Eosinophils Absolute: 0.2 K/uL (ref 0.0–0.5)
Eosinophils Relative: 5 %
HCT: 32.5 % — ABNORMAL LOW (ref 39.0–52.0)
Hemoglobin: 10.6 g/dL — ABNORMAL LOW (ref 13.0–17.0)
Immature Granulocytes: 0 %
Lymphocytes Relative: 22 %
Lymphs Abs: 0.9 K/uL (ref 0.7–4.0)
MCH: 27.1 pg (ref 26.0–34.0)
MCHC: 32.6 g/dL (ref 30.0–36.0)
MCV: 83.1 fL (ref 80.0–100.0)
Monocytes Absolute: 0.5 K/uL (ref 0.1–1.0)
Monocytes Relative: 11 %
Neutro Abs: 2.4 K/uL (ref 1.7–7.7)
Neutrophils Relative %: 61 %
Platelet Count: 302 K/uL (ref 150–400)
RBC: 3.91 MIL/uL — ABNORMAL LOW (ref 4.22–5.81)
RDW: 16.7 % — ABNORMAL HIGH (ref 11.5–15.5)
WBC Count: 4 K/uL (ref 4.0–10.5)
nRBC: 0 % (ref 0.0–0.2)

## 2024-05-22 LAB — MAGNESIUM: Magnesium: 1.5 mg/dL — ABNORMAL LOW (ref 1.7–2.4)

## 2024-05-22 MED ORDER — SODIUM CHLORIDE 0.9 % IV SOLN
400.0000 mg/m2 | Freq: Once | INTRAVENOUS | Status: AC
  Administered 2024-05-22: 728 mg via INTRAVENOUS
  Filled 2024-05-22: qty 25

## 2024-05-22 MED ORDER — PALONOSETRON HCL INJECTION 0.25 MG/5ML
0.2500 mg | Freq: Once | INTRAVENOUS | Status: AC
  Administered 2024-05-22: 0.25 mg via INTRAVENOUS
  Filled 2024-05-22: qty 5

## 2024-05-22 MED ORDER — DEXAMETHASONE SOD PHOSPHATE PF 10 MG/ML IJ SOLN
10.0000 mg | Freq: Once | INTRAMUSCULAR | Status: AC
  Administered 2024-05-22: 10 mg via INTRAVENOUS

## 2024-05-22 MED ORDER — SODIUM CHLORIDE 0.9 % IV SOLN
6.0000 mg/kg | Freq: Once | INTRAVENOUS | Status: AC
  Administered 2024-05-22: 400 mg via INTRAVENOUS
  Filled 2024-05-22: qty 20

## 2024-05-22 MED ORDER — MAGNESIUM SULFATE 2 GM/50ML IV SOLN
2.0000 g | Freq: Once | INTRAVENOUS | Status: AC
  Administered 2024-05-22: 2 g via INTRAVENOUS
  Filled 2024-05-22: qty 50

## 2024-05-22 MED ORDER — SODIUM CHLORIDE 0.9 % IV SOLN
2400.0000 mg/m2 | INTRAVENOUS | Status: DC
  Administered 2024-05-22: 4350 mg via INTRAVENOUS
  Filled 2024-05-22: qty 87

## 2024-05-22 MED ORDER — SODIUM CHLORIDE 0.9 % IV SOLN: Freq: Once | INTRAVENOUS | Status: AC

## 2024-05-22 MED ORDER — ALTEPLASE 2 MG IJ SOLR
2.0000 mg | Freq: Once | INTRAMUSCULAR | Status: AC
  Administered 2024-05-22: 2 mg
  Filled 2024-05-22: qty 2

## 2024-05-22 MED ORDER — ATROPINE SULFATE 1 MG/ML IV SOLN
0.5000 mg | Freq: Once | INTRAVENOUS | Status: AC | PRN
  Administered 2024-05-22: 0.5 mg via INTRAVENOUS
  Filled 2024-05-22: qty 1

## 2024-05-22 MED ORDER — SODIUM CHLORIDE 0.9 % IV SOLN
180.0000 mg/m2 | Freq: Once | INTRAVENOUS | Status: AC
  Administered 2024-05-22: 320 mg via INTRAVENOUS
  Filled 2024-05-22: qty 15

## 2024-05-22 NOTE — Progress Notes (Signed)
 Mg 1.5 - Patient received 2g of mag today and will receive an additional 2g with pump d/c on 10/22. Increase magnesium  to 4g IV for future cycles per Dr. Lanny. Magnesium  order and infusion appt requests updated in treatment plan to allow for enough time to give magnesium  4g on chemotherapy days. Scheduler made aware.  Harlene Nasuti, PharmD Oncology Infusion Pharmacist 05/22/2024 3:50 PM

## 2024-05-22 NOTE — Patient Instructions (Signed)
 [facility] - A DEPT OF [facility]  Discharge Instructions: Thank you for choosing [facility] to provide your oncology and hematology care.   If you have a lab appointment with the Cancer Center, please go directly to the Cancer Center and check in at the registration area.   Wear comfortable clothing and clothing appropriate for easy access to any Portacath or PICC line.   We strive to give you quality time with your provider. You may need to reschedule your appointment if you arrive late (15 or more minutes).  Arriving late affects you and other patients whose appointments are after yours.  Also, if you miss three or more appointments without notifying the office, you may be dismissed from the clinic at the provider's discretion.      For prescription refill requests, have your pharmacy contact our office and allow 72 hours for refills to be completed.    Today you received the following chemotherapy and/or immunotherapy agents oxaliplatin, leucovorin, fluorourcil      To help prevent nausea and vomiting after your treatment, we encourage you to take your nausea medication as directed.  BELOW ARE SYMPTOMS THAT SHOULD BE REPORTED IMMEDIATELY: *FEVER GREATER THAN 100.4 F (38 C) OR HIGHER *CHILLS OR SWEATING *NAUSEA AND VOMITING THAT IS NOT CONTROLLED WITH YOUR NAUSEA MEDICATION *UNUSUAL SHORTNESS OF BREATH *UNUSUAL BRUISING OR BLEEDING *URINARY PROBLEMS (pain or burning when urinating, or frequent urination) *BOWEL PROBLEMS (unusual diarrhea, constipation, pain near the anus) TENDERNESS IN MOUTH AND THROAT WITH OR WITHOUT PRESENCE OF ULCERS (sore throat, sores in mouth, or a toothache) UNUSUAL RASH, SWELLING OR PAIN  UNUSUAL VAGINAL DISCHARGE OR ITCHING   Items with * indicate a potential emergency and should be followed up as soon as possible or go to the Emergency Department if any problems should occur.  Please show the CHEMOTHERAPY  ALERT CARD or IMMUNOTHERAPY ALERT CARD at check-in to the Emergency Department and triage nurse.  Should you have questions after your visit or need to cancel or reschedule your appointment, please contact [facility] - A DEPT OF [facility]  Dept: [phone]  and follow the prompts.  Office hours are 8:00 a.m. to 4:30 p.m. Monday - Friday. Please note that voicemails left after 4:00 p.m. may not be returned until the following business day.  We are closed weekends and major holidays. You have access to a nurse at all times for urgent questions. Please call the main number to the clinic Dept: [phone] and follow the prompts.   For any non-urgent questions, you may also contact your provider using MyChart. We now offer e-Visits for anyone 19 and older to request care online for non-urgent symptoms. For details visit [email or web address].   Also download the MyChart app! Go to the app store, search "MyChart", open the app, select , and log in with your MyChart username and password.

## 2024-05-22 NOTE — Progress Notes (Signed)
 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  -He had a metastatic recurrence in the lungs and retroperitoneal adenopathy in September 2023 -he started FOLFIRI on 05/08/2022, Vectibix  added from cycle 2, tolerating well overall -restaging CT scan image from August 02, 2022 and 10/28/2022 both showed good partial response in pulmonary metastasis, no other new lesions. -we have changed his treatment to maintenance therapy with 5-fu and vectibix  in late March 2024 -He is tolerating treatment very well, will continue. -due to metastasis in both lungs and retroperitoneal lymph nodes, he is not a candidate for surgery or local therapy  -restaging CT 10/06/2023 showed stable disease overall except increased size of the pulmonary nodule in the lingula now measuring 7 mm Will continue current therapy. - Restaging CT scan on May 29th 2025 showed mild disease progression in the lung, I changed his chemotherapy back to FOLFIRI and Vectibix  on 01/03/2024 -CT 04/04/2024 showed stable disease, will continue current therapy   Assessment & Plan Metastatic colon cancer with lung and lymph node involvement Metastatic colon cancer with lung and lymph node involvement. Recent fall likely due to post-chemotherapy  weakness, not associated with dizziness or head injury. No current issues with nausea or diarrhea. Blood counts are well-managed, allowing continuation of chemotherapy. Discussion about potential chemotherapy break during holidays, with reassurance that a short break would not significantly impact cancer progression. Plan to perform a scan in early December to assess cancer status. - Continue chemotherapy as scheduled on November 3rd and November 17th. - Schedule a scan in early December to evaluate cancer status. - Discuss potential chemotherapy break during holidays if desired. - Provide a printed copy of the scan results for anatomical reference. - Ensure hydration and nutrition post-chemotherapy to prevent falls.  Schizophrenia - Continue follow-up with psychiatry and treatment  Plan - He is clinically stable, lab reviewed, adequate for treatment, will proceed to chemo today and continue 2 weeks - I spoke with his mother about chemo break around the holidays, she will think about it. - Up in 2 weeks before next cycle chemo.    SUMMARY OF ONCOLOGIC HISTORY: Oncology History Overview Note   Cancer Staging  Cancer of left colon [facility]) Staging form: Colon and Rectum, AJCC 8th Edition - Pathologic stage from 08/08/2021: Stage IIIB (pT3, pN1a, cM0) - Signed by [name], MD on 08/26/2021    Cancer of left colon (HCC)  04/01/2020 Imaging   IMPRESSION: 1. Gallbladder decompressed bowel also partially calcified gallstones. No pericholecystic inflammation though if there is persisting clinical concern for cholecystitis right upper quadrant ultrasound could be obtained. 2. Circumferential thickening of the distal thoracic esophagus. Could reflect features of esophagitis. Correlate with clinical symptoms and consider endoscopy as clinically warranted. 3. Additional segmental thickening of the mid to distal sigmoid with focal narrowing. No acute surrounding inflammation or  resulting obstruction. Findings are nonspecific, and could reflect sequela of prior inflammation/infection. However, recommend correlation with colonoscopy if not recently performed. 4. Mild circumferential bladder wall thickening and indentation of the bladder base by an enlarged prostate. Possibly sequela of chronic outlet obstruction though could correlate with urinalysis to exclude cystitis. 5. Aortic Atherosclerosis (ICD10-I70.0).   08/06/2021 Imaging   IMPRESSION: Sigmoid colonic perforation with small free intraperitoneal gas and infiltration of the mesenteric and omental fat in keeping with changes of peritonitis.   Long segment inflammatory stranding of the sigmoid colon in keeping with a severe infectious or inflammatory colitis. This terminates an area of irregular mural thickening, infiltrative soft tissue within the colonic mesentery, and focal dystrophic calcification. This may represent a chronic inflammatory process, however, a perforated malignancy could appear similarly. There are 2 separate points of perforation which again raise the question of an underlying malignancy.   7.1 cm gas and fluid containing pericolonic abscess within the sigmoid mesentery.   Marked inflammatory change of the terminal ileum adjacent to the pericolonic abscess with resultant small bowel obstruction. Fluid within the distal esophagus likely relates to gastroesophageal reflux the setting of vomiting.   Aortic Atherosclerosis (ICD10-I70.0).   08/08/2021 Cancer Staging   Staging form: Colon and Rectum, AJCC 8th Edition - Pathologic stage from 08/08/2021: Stage IIIB (pT3, pN1a, cM0) - Signed by [name], MD on 08/26/2021 Stage prefix: Initial diagnosis Total positive nodes: 1 Histologic grading system: 4 grade system Histologic grade (G): G2 Residual tumor (R): R0 - None   08/08/2021 Definitive Surgery   FINAL MICROSCOPIC DIAGNOSIS:   A. COLON, SIGMOID, PARTIAL COLECTOMY:  - Invasive  moderately differentiated adenocarcinoma.  - Metastatic carcinoma involving one of twelve lymph nodes (1/12).  - See oncology table below.   ADDENDUM:  Mismatch Repair Protein (IHC)  SUMMARY INTERPRETATION: NORMAL    08/14/2021 Imaging   EXAM: CT ABDOMEN AND PELVIS WITH CONTRAST  IMPRESSION: 1. Post recent sigmoid colectomy with left lower quadrant colostomy. Two small residual foci of air within the pelvic mesentery with mild adjacent thickening, but no abscess or drainable collection. Trace non organized free fluid and stranding in the pelvis. 2. Short segment of small bowel wall thickening and inflammation in the pelvis involving the distal ileum, likely reactive. 3. Dilated distal esophagus, stomach, and small bowel, without discrete transition point, favoring postoperative ileus. 4. Small bilateral pleural effusions and compressive atelectasis. 5. Heterogeneous partially enhancing 14 mm lymph node in the retroperitoneum at the aortoiliac bifurcation, not significantly changed from prior exam. Suspected additional lymph nodes in the anterior common iliac space, not significantly changed from prior exam. Recommend attention at follow-up. 6. Additional chronic findings as described.     08/15/2021 Imaging   EXAM: CT CHEST WITH CONTRAST  IMPRESSION: 1. No evidence of thoracic metastasis. 2. Bilateral small layering pleural effusions with passive atelectasis   08/26/2021 Initial Diagnosis   Cancer of left colon (HCC)   10/10/2021 - 12/12/2021 Chemotherapy   Patient is on Treatment Plan : COLORECTAL Xelox (Capeox) q21d     05/28/2022 - 05/28/2022 Chemotherapy   Patient is on Treatment Plan : COLORECTAL FOLFIRI + Bevacizumab q14d     05/28/2022 -  Chemotherapy   Patient is on Treatment Plan : COLORECTAL FOLFIRI + Panitumumab  q14d     07/31/2022 Imaging    IMPRESSION: Decreased bilateral pulmonary metastases.   Stable mild abdominal lymphadenopathy.   No new or  progressive metastatic disease within the chest, abdomen, or pelvis.   01/18/2023 Imaging  IMPRESSION: 1. Status post Hetty pouch sigmoid colon resection with left lower quadrant end colostomy and rectal stump. Unchanged, mild wall thickening of the decompressed distal colon at the ileostomy, as well as the rectal stump. 2. Unchanged small, treated metastatic pulmonary nodules. No new nodules. 3. Unchanged enlarged, coarsely calcified treated lower aortocaval metastatic lymph nodes. No new lymphadenopathy. 4. Coronary artery disease. 5. Cholelithiasis.   04/23/2023 Imaging   CT chest, abdomen, and pelvis with contrast  IMPRESSION: 1. Left lower lobe segmental pulmonary emboli, incidental finding. No evidence for right heart strain. 2. Stable trace bilateral pleural effusions. 3. Stable enlarged lower left paraesophageal lymph node. 4. Stable calcified retroperitoneal lymph nodes. No new enlarged lymph nodes in the chest, abdomen or pelvis. 5. Stable subcentimeter soft tissue nodules within the mesenteric fat along the inferior ostomy. 6. Left Bosniak I benign renal cyst measuring 4.6 cm. No follow-up imaging is recommended. JACR 2018 Feb; 264-273, Management of the Incidental Renal Mass on CT, [employer] 2021; 814-848, Bosniak Classification of Cystic Renal Masses, Version 2019.   07/15/2023 Imaging   CT chest abdomen and pelvis with contrast IMPRESSION: 1. Prior partial left hemicolectomy with end colostomy in the left anterior abdominal wall. Unchanged mild wall thickening of the decompressed distal colon at the ileostomy as well as the rectal stump/Hartmann's pouch. 2. Stable enlarged left lower paraesophageal lymph node. 3. Increased size of the treated pulmonary nodule in the lingula now measuring 6 mm previously 4 mm with increased in size favored artifactual given motion degrading examination of the lung bases. Suggest attention on follow-up imaging 4. Unchanged size of  the treated pulmonary nodule in the right middle lobe measuring 4 mm. 5. Increased size of prominent bilateral axillary lymph nodes measuring up to 7 mm in short axis, nonspecific. Suggest attention on follow-up imaging 6. No convincing evidence of new or progressive disease in the chest, abdomen or pelvis. 7.  Aortic Atherosclerosis (ICD10-I70.0).     10/06/2023 Imaging   CT chest abdomen pelvis with contrast IMPRESSION: 1. Increased size of the pulmonary nodule in the lingula now measuring 7 mm, concerning for pulmonary metastatic disease recurrence. 2. Additional bilateral pulmonary nodules are stable. 3. Stable prominent bilateral axillary, low paraesophageal and retroperitoneal lymph nodes. 4. Prior partial left hemicolectomy with Hartmann's pouch formation and left anterior abdominal wall colostomy. No suspicious nodularity along the Hartmann's suture line. 5. Patulous esophagus with symmetric distal esophageal wall thickening, similar prior. 6. Trace right pleural effusion. 7.  Aortic Atherosclerosis (ICD10-I70.0).     12/30/2023 Imaging   CT chest, abdomen, and pelvis with contrast IMPRESSION: 1. Increased size of bilateral pulmonary nodules with new 4 mm left upper lobe and 5 mm superior segment right lower lobe pulmonary nodules, compatible with worsening pulmonary metastatic disease. 2. Stable prominent bilateral axillary and mediastinal lymph nodes. 3. Stable coarsely calcified retroperitoneal lymph nodes measuring up to 10 mm in short axis. No new pathologically enlarged abdominal or pelvic lymph nodes. 4. Stable trace right pleural effusion. 5. Prior partial left hemicolectomy with Hartmann's pouch formation and left anterior abdominal wall colostomy. No new suspicious nodularity along the suture line.      Discussed the use of AI scribe software for clinical note transcription with the patient, who gave verbal consent to proceed.  History of Present Illness Matthew [name]  is a 58 year old male with metastatic colon cancer who presents for follow-up. He is accompanied by his mother.  He experienced a fall earlier this month but did not  sustain any head injury. There was no dizziness at the time, and he had eaten that day. His blood pressure is slightly low on the diastolic side, but he is not on any antihypertensive medication. He maintains a good appetite without nausea or diarrhea. He is aware of his metastatic colon cancer involving the lungs and lymph nodes, with persistent lung nodules. He continues long-term chemotherapy. He receives schizophrenia treatment at [facility] with regular injections. He remains independent in personal care but limits activities to watching TV and sitting in the living room.     All other systems were reviewed with the patient and are negative.  MEDICAL HISTORY:  Past Medical History:  Diagnosis Date   Cancer (HCC)    Schizophrenia [facility])     SURGICAL HISTORY: Past Surgical History:  Procedure Laterality Date   BRONCHIAL BIOPSY  05/11/2022   Procedure: BRONCHIAL BIOPSIES;  Surgeon: Shelah Lamar RAMAN, MD;  Location: [facility] ENDOSCOPY;  Service: Pulmonary;;   BRONCHIAL BRUSHINGS  05/11/2022   Procedure: BRONCHIAL BRUSHINGS;  Surgeon: Shelah Lamar RAMAN, MD;  Location: [facility] ENDOSCOPY;  Service: Pulmonary;;   BRONCHIAL NEEDLE ASPIRATION BIOPSY  05/11/2022   Procedure: BRONCHIAL NEEDLE ASPIRATION BIOPSIES;  Surgeon: Shelah Lamar RAMAN, MD;  Location: [facility] ENDOSCOPY;  Service: Pulmonary;;   BRONCHIAL WASHINGS  05/11/2022   Procedure: BRONCHIAL WASHINGS;  Surgeon: Shelah Lamar RAMAN, MD;  Location: [facility] ENDOSCOPY;  Service: Pulmonary;;   IR IMAGING GUIDED PORT INSERTION  05/25/2022   LAPAROTOMY N/A 08/08/2021   Procedure: EXPLORATORY LAPAROTOMY;  Surgeon: [name], MD;  Location: [facility] OR;  Service: General;  Laterality: N/A;   PARTIAL COLECTOMY N/A 08/08/2021   Procedure: PARTIAL COLECTOMY WITH COLOSTOMY;  Surgeon: [name], MD;  Location: [facility] OR;   Service: General;  Laterality: N/A;   VIDEO BRONCHOSCOPY WITH RADIAL ENDOBRONCHIAL ULTRASOUND  05/11/2022   Procedure: VIDEO BRONCHOSCOPY WITH RADIAL ENDOBRONCHIAL ULTRASOUND;  Surgeon: Shelah Lamar RAMAN, MD;  Location: [facility] ENDOSCOPY;  Service: Pulmonary;;    I have reviewed the social history and family history with the patient and they are unchanged from previous note.  ALLERGIES:  has no known allergies.  MEDICATIONS:  Current Outpatient Medications  Medication Sig Dispense Refill   acetaminophen  (TYLENOL ) 500 MG tablet Take 2 tablets (1,000 mg total) by mouth every 6 (six) hours as needed for mild pain or moderate pain.  0   apixaban  (ELIQUIS ) 5 MG TABS tablet Take 1 tablet (5 mg total) by mouth 2 (two) times daily. 60 tablet 2   aspirin EC 81 MG tablet Take 81 mg by mouth daily as needed (for pain or headaches).      benztropine  (COGENTIN ) 1 MG tablet Take 1 mg at bedtime by mouth.     clindamycin  (CLINDAGEL) 1 % gel Apply topically 2 (two) times daily. To skin rash on face and upper body 30 g 0   ferrous sulfate  325 (65 FE) MG EC tablet Take 1 tablet (325 mg total) by mouth daily. 30 tablet 3   hydrocortisone  cream 1 % Apply 1 Application topically 2 (two) times daily as needed for itching. For rash 30 g 2   loperamide  (IMODIUM ) 2 MG capsule Take 1 capsule (2 mg total) by mouth as needed for diarrhea or loose stools. 30 capsule 0   magnesium  oxide (MAG-OX) 400 (240 Mg) MG tablet Take 1 tablet (400 mg total) by mouth 2 (two) times daily. 30 tablet 2   Multiple Vitamin (MULTIVITAMIN WITH MINERALS) TABS tablet Take 1 tablet by mouth  daily.     ondansetron  (ZOFRAN ) 8 MG tablet Take 1 tablet (8 mg total) by mouth every 8 (eight) hours as needed for nausea or vomiting. 30 tablet 0   paliperidone  (INVEGA  SUSTENNA) 156 MG/ML SUSP injection Inject 156 mg every 30 (thirty) days into the muscle.     polyethylene glycol (MIRALAX  / GLYCOLAX ) 17 g packet Take 17 g by mouth daily as needed for mild  constipation or moderate constipation.  0   potassium chloride  SA (KLOR-CON  M) 20 MEQ tablet Take 1 tablet (20 mEq total) by mouth daily. 30 tablet 2   prochlorperazine  (COMPAZINE ) 10 MG tablet TAKE 1 TABLET(10 MG) BY MOUTH EVERY 6 HOURS AS NEEDED FOR NAUSEA OR VOMITING 30 tablet 0   No current facility-administered medications for this visit.    PHYSICAL EXAMINATION: ECOG PERFORMANCE STATUS: 2 - Symptomatic, <50% confined to bed  Vitals:   05/22/24 1147  BP: (!) 110/56  Pulse: (!) 105  Resp: (!) 99  Temp: 98.6 F (37 C)   Wt Readings from Last 3 Encounters:  05/22/24 142 lb 11.2 oz (64.7 kg)  05/08/24 139 lb 11.2 oz (63.4 kg)  04/24/24 142 lb 11.2 oz (64.7 kg)     GENERAL:alert, no distress and comfortable SKIN: skin color, texture, turgor are normal, no rashes or significant lesions EYES: normal, Conjunctiva are pink and non-injected, sclera clear NECK: supple, thyroid normal size, non-tender, without nodularity LYMPH:  no palpable lymphadenopathy in the cervical, axillary  LUNGS: clear to auscultation and percussion with normal breathing effort HEART: regular rate & rhythm and no murmurs and no lower extremity edema ABDOMEN:abdomen soft, non-tender and normal bowel sounds Musculoskeletal:no cyanosis of digits and no clubbing  NEURO: alert & oriented x 3 with fluent speech, no focal motor/sensory deficits  Physical Exam   LABORATORY DATA:  I have reviewed the data as listed    Latest Ref Rng & Units 05/22/2024   11:30 AM 05/08/2024   11:17 AM 04/24/2024    8:08 AM  CBC  WBC 4.0 - 10.5 K/uL 4.0  4.1  3.6   Hemoglobin 13.0 - 17.0 g/dL 89.3  87.9  87.9   Hematocrit 39.0 - 52.0 % 32.5  35.8  36.5   Platelets 150 - 400 K/uL 302  322  346         Latest Ref Rng & Units 05/22/2024   11:30 AM 05/08/2024   11:17 AM 04/24/2024    8:08 AM  CMP  Glucose 70 - 99 mg/dL 897  94  95   BUN 6 - 20 mg/dL 13  14  10    Creatinine 0.61 - 1.24 mg/dL 9.22  9.26  9.33   Sodium 135  - 145 mmol/L 138  140  139   Potassium 3.5 - 5.1 mmol/L 3.7  3.5  3.8   Chloride 98 - 111 mmol/L 108  109  109   CO2 22 - 32 mmol/L 24  24  24    Calcium  8.9 - 10.3 mg/dL 9.1  9.5  8.8   Total Protein 6.5 - 8.1 g/dL 5.9  6.5  6.3   Total Bilirubin 0.0 - 1.2 mg/dL 0.6  0.7  0.6   Alkaline Phos 38 - 126 U/L 91  93  101   AST 15 - 41 U/L 17  12  12    ALT 0 - 44 U/L 14  8  9        RADIOGRAPHIC STUDIES: I have personally reviewed the radiological images as listed  and agreed with the findings in the report. No results found.    Orders Placed This Encounter  Procedures   CBC with Differential (Cancer Center Only)    Standing Status:   Future    Expected Date:   07/17/2024    Expiration Date:   07/17/2025   CMP (Cancer Center only)    Standing Status:   Future    Expected Date:   07/17/2024    Expiration Date:   07/18/2025   Magnesium     Standing Status:   Future    Expected Date:   07/17/2024    Expiration Date:   07/17/2025   All questions were answered. The patient knows to call the clinic with any problems, questions or concerns. No barriers to learning was detected. The total time spent in the appointment was 25 minutes, including review of chart and various tests results, discussions about plan of care and coordination of care plan     Onita Mattock, MD 05/22/2024

## 2024-05-22 NOTE — Assessment & Plan Note (Signed)
 stage IIIB p(T3, N1aM0), MSS, KRAS/NRAS/BRAF wild type, lung and node metastasis in 04/2022  -Initially diagnosed in 08/2021 -he completed 4 cycles adjuvant CAPOX 10/10/21 - 12/25/21 (though he somehow was still taking Xeloda  through ~02/09/22 due to misunderstandings).  -He had a metastatic recurrence in the lungs and retroperitoneal adenopathy in September 2023 -he started FOLFIRI on 05/08/2022, Vectibix  added from cycle 2, tolerating well overall -restaging CT scan image from August 02, 2022 and 10/28/2022 both showed good partial response in pulmonary metastasis, no other new lesions. -we have changed his treatment to maintenance therapy with 5-fu and vectibix  in late March 2024 -He is tolerating treatment very well, will continue. -due to metastasis in both lungs and retroperitoneal lymph nodes, he is not a candidate for surgery or local therapy  -restaging CT 10/06/2023 showed stable disease overall except increased size of the pulmonary nodule in the lingula now measuring 7 mm Will continue current therapy. - Restaging CT scan on May 29th 2025 showed mild disease progression in the lung, I changed his chemotherapy back to FOLFIRI and Vectibix  on 01/03/2024 -CT 04/04/2024 showed stable disease, will continue current therapy

## 2024-05-24 ENCOUNTER — Inpatient Hospital Stay: Payer: MEDICAID

## 2024-05-24 VITALS — BP 100/72 | HR 106 | Temp 98.0°F | Resp 16

## 2024-05-24 DIAGNOSIS — Z5111 Encounter for antineoplastic chemotherapy: Secondary | ICD-10-CM | POA: Diagnosis not present

## 2024-05-24 DIAGNOSIS — C186 Malignant neoplasm of descending colon: Secondary | ICD-10-CM

## 2024-05-24 MED ORDER — MAGNESIUM SULFATE 2 GM/50ML IV SOLN
2.0000 g | Freq: Once | INTRAVENOUS | Status: AC
  Administered 2024-05-24: 2 g via INTRAVENOUS
  Filled 2024-05-24: qty 50

## 2024-05-24 NOTE — Patient Instructions (Signed)
 Matthew [name]

## 2024-05-31 ENCOUNTER — Other Ambulatory Visit: Payer: Self-pay

## 2024-06-04 NOTE — Assessment & Plan Note (Signed)
 stage IIIB p(T3, N1aM0), MSS, KRAS/NRAS/BRAF wild type, lung and node metastasis in 04/2022  -Initially diagnosed in 08/2021 -he completed 4 cycles adjuvant CAPOX 10/10/21 - 12/25/21 (though he somehow was still taking Xeloda  through ~02/09/22 due to misunderstandings).  -He had a metastatic recurrence in the lungs and retroperitoneal adenopathy in September 2023 -he started FOLFIRI on 05/08/2022, Vectibix  added from cycle 2, tolerating well overall -restaging CT scan image from August 02, 2022 and 10/28/2022 both showed good partial response in pulmonary metastasis, no other new lesions. -we have changed his treatment to maintenance therapy with 5-fu and vectibix  in late March 2024 -He is tolerating treatment very well, will continue. -due to metastasis in both lungs and retroperitoneal lymph nodes, he is not a candidate for surgery or local therapy  -restaging CT 10/06/2023 showed stable disease overall except increased size of the pulmonary nodule in the lingula now measuring 7 mm Will continue current therapy. - Restaging CT scan on May 29th 2025 showed mild disease progression in the lung, I changed his chemotherapy back to FOLFIRI and Vectibix  on 01/03/2024 -CT 04/04/2024 showed stable disease, will continue current therapy

## 2024-06-05 ENCOUNTER — Inpatient Hospital Stay (HOSPITAL_BASED_OUTPATIENT_CLINIC_OR_DEPARTMENT_OTHER): Payer: MEDICAID | Admitting: Hematology

## 2024-06-05 ENCOUNTER — Inpatient Hospital Stay: Payer: MEDICAID

## 2024-06-05 ENCOUNTER — Inpatient Hospital Stay: Payer: MEDICAID | Attending: Hematology

## 2024-06-05 VITALS — BP 109/74 | HR 105 | Temp 98.4°F | Resp 15 | Ht 69.0 in | Wt 143.8 lb

## 2024-06-05 DIAGNOSIS — C186 Malignant neoplasm of descending colon: Secondary | ICD-10-CM | POA: Insufficient documentation

## 2024-06-05 DIAGNOSIS — C7802 Secondary malignant neoplasm of left lung: Secondary | ICD-10-CM | POA: Insufficient documentation

## 2024-06-05 DIAGNOSIS — E876 Hypokalemia: Secondary | ICD-10-CM

## 2024-06-05 DIAGNOSIS — Z79634 Long term (current) use of topoisomerase inhibitor: Secondary | ICD-10-CM | POA: Insufficient documentation

## 2024-06-05 DIAGNOSIS — Z79631 Long term (current) use of antimetabolite agent: Secondary | ICD-10-CM | POA: Insufficient documentation

## 2024-06-05 DIAGNOSIS — Z5112 Encounter for antineoplastic immunotherapy: Secondary | ICD-10-CM | POA: Insufficient documentation

## 2024-06-05 DIAGNOSIS — Z5111 Encounter for antineoplastic chemotherapy: Secondary | ICD-10-CM | POA: Insufficient documentation

## 2024-06-05 DIAGNOSIS — C7801 Secondary malignant neoplasm of right lung: Secondary | ICD-10-CM | POA: Insufficient documentation

## 2024-06-05 LAB — CBC WITH DIFFERENTIAL (CANCER CENTER ONLY)
Abs Immature Granulocytes: 0.02 K/uL (ref 0.00–0.07)
Basophils Absolute: 0 K/uL (ref 0.0–0.1)
Basophils Relative: 0 %
Eosinophils Absolute: 0.1 K/uL (ref 0.0–0.5)
Eosinophils Relative: 1 %
HCT: 32.3 % — ABNORMAL LOW (ref 39.0–52.0)
Hemoglobin: 10.8 g/dL — ABNORMAL LOW (ref 13.0–17.0)
Immature Granulocytes: 0 %
Lymphocytes Relative: 10 %
Lymphs Abs: 0.7 K/uL (ref 0.7–4.0)
MCH: 27.6 pg (ref 26.0–34.0)
MCHC: 33.4 g/dL (ref 30.0–36.0)
MCV: 82.6 fL (ref 80.0–100.0)
Monocytes Absolute: 0.5 K/uL (ref 0.1–1.0)
Monocytes Relative: 7 %
Neutro Abs: 5.2 K/uL (ref 1.7–7.7)
Neutrophils Relative %: 82 %
Platelet Count: 344 K/uL (ref 150–400)
RBC: 3.91 MIL/uL — ABNORMAL LOW (ref 4.22–5.81)
RDW: 17.1 % — ABNORMAL HIGH (ref 11.5–15.5)
WBC Count: 6.4 K/uL (ref 4.0–10.5)
nRBC: 0 % (ref 0.0–0.2)

## 2024-06-05 LAB — CMP (CANCER CENTER ONLY)
ALT: 11 U/L (ref 0–44)
AST: 13 U/L — ABNORMAL LOW (ref 15–41)
Albumin: 3.6 g/dL (ref 3.5–5.0)
Alkaline Phosphatase: 112 U/L (ref 38–126)
Anion gap: 6 (ref 5–15)
BUN: 12 mg/dL (ref 6–20)
CO2: 24 mmol/L (ref 22–32)
Calcium: 8.8 mg/dL — ABNORMAL LOW (ref 8.9–10.3)
Chloride: 107 mmol/L (ref 98–111)
Creatinine: 0.71 mg/dL (ref 0.61–1.24)
GFR, Estimated: >60 mL/min (ref >60)
Glucose, Bld: 131 mg/dL — ABNORMAL HIGH (ref 70–99)
Potassium: 3.1 mmol/L — ABNORMAL LOW (ref 3.5–5.1)
Sodium: 137 mmol/L (ref 135–145)
Total Bilirubin: 0.6 mg/dL (ref 0.0–1.2)
Total Protein: 6.2 g/dL — ABNORMAL LOW (ref 6.5–8.1)

## 2024-06-05 LAB — MAGNESIUM: Magnesium: 1.5 mg/dL — ABNORMAL LOW (ref 1.7–2.4)

## 2024-06-05 MED ORDER — MAGNESIUM SULFATE 4 GM/100ML IV SOLN
4.0000 g | Freq: Once | INTRAVENOUS | Status: AC
  Administered 2024-06-05: 4 g via INTRAVENOUS
  Filled 2024-06-05: qty 100

## 2024-06-05 MED ORDER — SODIUM CHLORIDE 0.9 % IV SOLN
180.0000 mg/m2 | Freq: Once | INTRAVENOUS | Status: AC
  Administered 2024-06-05: 320 mg via INTRAVENOUS
  Filled 2024-06-05: qty 15

## 2024-06-05 MED ORDER — POTASSIUM CHLORIDE 10 MEQ/100ML IV SOLN
10.0000 meq | INTRAVENOUS | Status: AC
  Administered 2024-06-05 (×2): 10 meq via INTRAVENOUS
  Filled 2024-06-05 (×2): qty 100

## 2024-06-05 MED ORDER — SODIUM CHLORIDE 0.9 % IV SOLN
6.0000 mg/kg | Freq: Once | INTRAVENOUS | Status: AC
  Administered 2024-06-05: 400 mg via INTRAVENOUS
  Filled 2024-06-05: qty 20

## 2024-06-05 MED ORDER — SODIUM CHLORIDE 0.9 % IV SOLN
400.0000 mg/m2 | Freq: Once | INTRAVENOUS | Status: AC
  Administered 2024-06-05: 728 mg via INTRAVENOUS
  Filled 2024-06-05: qty 17.5

## 2024-06-05 MED ORDER — ATROPINE SULFATE 1 MG/ML IV SOLN
0.5000 mg | Freq: Once | INTRAVENOUS | Status: AC | PRN
  Administered 2024-06-05: 0.5 mg via INTRAVENOUS
  Filled 2024-06-05: qty 1

## 2024-06-05 MED ORDER — SODIUM CHLORIDE 0.9 % IV SOLN
2400.0000 mg/m2 | INTRAVENOUS | Status: DC
  Administered 2024-06-05: 4350 mg via INTRAVENOUS
  Filled 2024-06-05: qty 87

## 2024-06-05 MED ORDER — POTASSIUM CHLORIDE CRYS ER 20 MEQ PO TBCR: 20.0000 meq | EXTENDED_RELEASE_TABLET | Freq: Two times a day (BID) | ORAL | 2 refills | Status: AC

## 2024-06-05 MED ORDER — SODIUM CHLORIDE 0.9 % IV SOLN: Freq: Once | INTRAVENOUS | Status: AC

## 2024-06-05 MED ORDER — DEXAMETHASONE SOD PHOSPHATE PF 10 MG/ML IJ SOLN
10.0000 mg | Freq: Once | INTRAMUSCULAR | Status: AC
  Administered 2024-06-05: 10 mg via INTRAVENOUS

## 2024-06-05 MED ORDER — PALONOSETRON HCL INJECTION 0.25 MG/5ML
0.2500 mg | Freq: Once | INTRAVENOUS | Status: AC
  Administered 2024-06-05: 0.25 mg via INTRAVENOUS
  Filled 2024-06-05: qty 5

## 2024-06-05 NOTE — Patient Instructions (Signed)
 [facility] - A DEPT OF [facility]  Discharge Instructions: Thank you for choosing [facility] to provide your oncology and hematology care.   If you have a lab appointment with the Cancer Center, please go directly to the Cancer Center and check in at the registration area.   Wear comfortable clothing and clothing appropriate for easy access to any Portacath or PICC line.   We strive to give you quality time with your provider. You may need to reschedule your appointment if you arrive late (15 or more minutes).  Arriving late affects you and other patients whose appointments are after yours.  Also, if you miss three or more appointments without notifying the office, you may be dismissed from the clinic at the provider's discretion.      For prescription refill requests, have your pharmacy contact our office and allow 72 hours for refills to be completed.    Today you received the following chemotherapy and/or immunotherapy agents Vectibix , Irinotecan , Leukovorin, 5FU. 4g Magnesium , 20meq Potassium      To help prevent nausea and vomiting after your treatment, we encourage you to take your nausea medication as directed.  BELOW ARE SYMPTOMS THAT SHOULD BE REPORTED IMMEDIATELY: *FEVER GREATER THAN 100.4 F (38 C) OR HIGHER *CHILLS OR SWEATING *NAUSEA AND VOMITING THAT IS NOT CONTROLLED WITH YOUR NAUSEA MEDICATION *UNUSUAL SHORTNESS OF BREATH *UNUSUAL BRUISING OR BLEEDING *URINARY PROBLEMS (pain or burning when urinating, or frequent urination) *BOWEL PROBLEMS (unusual diarrhea, constipation, pain near the anus) TENDERNESS IN MOUTH AND THROAT WITH OR WITHOUT PRESENCE OF ULCERS (sore throat, sores in mouth, or a toothache) UNUSUAL RASH, SWELLING OR PAIN  UNUSUAL VAGINAL DISCHARGE OR ITCHING   Items with * indicate a potential emergency and should be followed up as soon as possible or go to the Emergency Department if any problems should  occur.  Please show the CHEMOTHERAPY ALERT CARD or IMMUNOTHERAPY ALERT CARD at check-in to the Emergency Department and triage nurse.  Should you have questions after your visit or need to cancel or reschedule your appointment, please contact [facility] - A DEPT OF [facility]  Dept: [phone]  and follow the prompts.  Office hours are 8:00 a.m. to 4:30 p.m. Monday - Friday. Please note that voicemails left after 4:00 p.m. may not be returned until the following business day.  We are closed weekends and major holidays. You have access to a nurse at all times for urgent questions. Please call the main number to the clinic Dept: [phone] and follow the prompts.   For any non-urgent questions, you may also contact your provider using MyChart. We now offer e-Visits for anyone 26 and older to request care online for non-urgent symptoms. For details visit [email or web address].   Also download the MyChart app! Go to the app store, search MyChart, open the app, select , and log in with your MyChart username and password.

## 2024-06-05 NOTE — Progress Notes (Addendum)
 Verbal order w/readback from Dr. Lanny for K+ PO or IV 20meq Once while pt is in infusion today for a K+ 3.1.  Infusion RN can decided which one to give based on chair time restraints and pt's preference.  Orders are under signed & Held.

## 2024-06-05 NOTE — Progress Notes (Signed)
 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  -He had a metastatic recurrence in the lungs and retroperitoneal adenopathy in September 2023 -he started FOLFIRI on 05/08/2022, Vectibix  added from cycle 2, tolerating well overall -restaging CT scan image from August 02, 2022 and 10/28/2022 both showed good partial response in pulmonary metastasis, no other new lesions. -we have changed his treatment to maintenance therapy with 5-fu and vectibix  in late March 2024 -He is tolerating treatment very well, will continue. -due to metastasis in both lungs and retroperitoneal lymph nodes, he is not a candidate for surgery or local therapy  -restaging CT 10/06/2023 showed stable disease overall except increased size of the pulmonary nodule in the lingula now measuring 7 mm Will continue current therapy. - Restaging CT scan on May 29th 2025 showed mild disease progression in the lung, I changed his chemotherapy back to FOLFIRI and Vectibix  on 01/03/2024 -CT 04/04/2024 showed stable disease, will continue current therapy   Assessment & Plan Metastatic colon cancer Ongoing treatment. No new symptoms reported. Treatment schedule adjusted to accommodate Thanksgiving holiday. - Continue chemotherapy as  scheduled with next treatment on November 17th and December 1st. - Ordered scan for early December to assess treatment response.  Ostomy site bleeding and leakage Intermittent bleeding and leakage at the ostomy site, possibly related to ostomy appliance issues. No consistent pattern of bleeding noted. - Referred to ostomy clinic for evaluation and management of ostomy site bleeding and leakage.  Hypokalemia Low potassium levels. Currently taking potassium supplements but adherence is inconsistent. - Prescribed potassium supplements to be taken twice daily. - Ensured potassium prescription was filled at the pharmacy.  Anemia Mild anemia noted on recent lab results.  Plan - Lab reviewed, adequate for treatment, will proceed with chemo today - Will give oral potassium 20 mill equal today in the infusion room, and I reviewed his potassium prescription, he will take it twice a day at home - Follow-up in 2 weeks before next cycle chemo - Restaging CT in 5 weeks - I referred him to ostomy clinic regarding the leakage issue and mild bleeding at the ostomy site   SUMMARY OF ONCOLOGIC HISTORY: Oncology History Overview Note   Cancer Staging  Cancer of left colon [facility]) Staging form: Colon and Rectum, AJCC 8th Edition - Pathologic stage from 08/08/2021: Stage IIIB (pT3, pN1a, cM0) - Signed by [name], MD on 08/26/2021    Cancer of left colon (HCC)  04/01/2020 Imaging   IMPRESSION: 1. Gallbladder decompressed bowel also partially calcified gallstones. No pericholecystic inflammation though if there is persisting clinical concern for cholecystitis right upper quadrant ultrasound could be obtained. 2. Circumferential thickening of the distal thoracic esophagus. Could reflect features of esophagitis. Correlate with clinical symptoms and consider endoscopy as clinically warranted. 3. Additional segmental thickening of the mid to distal sigmoid with  focal narrowing. No acute surrounding  inflammation or resulting obstruction. Findings are nonspecific, and could reflect sequela of prior inflammation/infection. However, recommend correlation with colonoscopy if not recently performed. 4. Mild circumferential bladder wall thickening and indentation of the bladder base by an enlarged prostate. Possibly sequela of chronic outlet obstruction though could correlate with urinalysis to exclude cystitis. 5. Aortic Atherosclerosis (ICD10-I70.0).   08/06/2021 Imaging   IMPRESSION: Sigmoid colonic perforation with small free intraperitoneal gas and infiltration of the mesenteric and omental fat in keeping with changes of peritonitis.   Long segment inflammatory stranding of the sigmoid colon in keeping with a severe infectious or inflammatory colitis. This terminates an area of irregular mural thickening, infiltrative soft tissue within the colonic mesentery, and focal dystrophic calcification. This may represent a chronic inflammatory process, however, a perforated malignancy could appear similarly. There are 2 separate points of perforation which again raise the question of an underlying malignancy.   7.1 cm gas and fluid containing pericolonic abscess within the sigmoid mesentery.   Marked inflammatory change of the terminal ileum adjacent to the pericolonic abscess with resultant small bowel obstruction. Fluid within the distal esophagus likely relates to gastroesophageal reflux the setting of vomiting.   Aortic Atherosclerosis (ICD10-I70.0).   08/08/2021 Cancer Staging   Staging form: Colon and Rectum, AJCC 8th Edition - Pathologic stage from 08/08/2021: Stage IIIB (pT3, pN1a, cM0) - Signed by [name], MD on 08/26/2021 Stage prefix: Initial diagnosis Total positive nodes: 1 Histologic grading system: 4 grade system Histologic grade (G): G2 Residual tumor (R): R0 - None   08/08/2021 Definitive Surgery   FINAL MICROSCOPIC DIAGNOSIS:   A. COLON, SIGMOID, PARTIAL COLECTOMY:  -  Invasive moderately differentiated adenocarcinoma.  - Metastatic carcinoma involving one of twelve lymph nodes (1/12).  - See oncology table below.   ADDENDUM:  Mismatch Repair Protein (IHC)  SUMMARY INTERPRETATION: NORMAL    08/14/2021 Imaging   EXAM: CT ABDOMEN AND PELVIS WITH CONTRAST  IMPRESSION: 1. Post recent sigmoid colectomy with left lower quadrant colostomy. Two small residual foci of air within the pelvic mesentery with mild adjacent thickening, but no abscess or drainable collection. Trace non organized free fluid and stranding in the pelvis. 2. Short segment of small bowel wall thickening and inflammation in the pelvis involving the distal ileum, likely reactive. 3. Dilated distal esophagus, stomach, and small bowel, without discrete transition point, favoring postoperative ileus. 4. Small bilateral pleural effusions and compressive atelectasis. 5. Heterogeneous partially enhancing 14 mm lymph node in the retroperitoneum at the aortoiliac bifurcation, not significantly changed from prior exam. Suspected additional lymph nodes in the anterior common iliac space, not significantly changed from prior exam. Recommend attention at follow-up. 6. Additional chronic findings as described.     08/15/2021 Imaging   EXAM: CT CHEST WITH CONTRAST  IMPRESSION: 1. No evidence of thoracic metastasis. 2. Bilateral small layering pleural effusions with passive atelectasis   08/26/2021 Initial Diagnosis   Cancer of left colon (HCC)   10/10/2021 - 12/12/2021 Chemotherapy   Patient is on Treatment Plan : COLORECTAL Xelox ([place]apeox) q21d     05/28/2022 - 05/28/2022 Chemotherapy   Patient is on Treatment Plan : COLORECTAL FOLFIRI + Bevacizumab q14d     05/28/2022 -  Chemotherapy   Patient is on Treatment Plan : COLORECTAL FOLFIRI + Panitumumab  q14d     07/31/2022 Imaging    IMPRESSION: Decreased bilateral pulmonary metastases.   Stable mild abdominal lymphadenopathy.   No new or  progressive metastatic disease within the  chest, abdomen, or pelvis.   01/18/2023 Imaging    IMPRESSION: 1. Status post Hetty pouch sigmoid colon resection with left lower quadrant end colostomy and rectal stump. Unchanged, mild wall thickening of the decompressed distal colon at the ileostomy, as well as the rectal stump. 2. Unchanged small, treated metastatic pulmonary nodules. No new nodules. 3. Unchanged enlarged, coarsely calcified treated lower aortocaval metastatic lymph nodes. No new lymphadenopathy. 4. Coronary artery disease. 5. Cholelithiasis.   04/23/2023 Imaging   CT chest, abdomen, and pelvis with contrast  IMPRESSION: 1. Left lower lobe segmental pulmonary emboli, incidental finding. No evidence for right heart strain. 2. Stable trace bilateral pleural effusions. 3. Stable enlarged lower left paraesophageal lymph node. 4. Stable calcified retroperitoneal lymph nodes. No new enlarged lymph nodes in the chest, abdomen or pelvis. 5. Stable subcentimeter soft tissue nodules within the mesenteric fat along the inferior ostomy. 6. Left Bosniak I benign renal cyst measuring 4.6 cm. No follow-up imaging is recommended. JACR 2018 Feb; 264-273, Management of the Incidental Renal Mass on CT, [employer] 2021; 814-848, Bosniak Classification of Cystic Renal Masses, Version 2019.   07/15/2023 Imaging   CT chest abdomen and pelvis with contrast IMPRESSION: 1. Prior partial left hemicolectomy with end colostomy in the left anterior abdominal wall. Unchanged mild wall thickening of the decompressed distal colon at the ileostomy as well as the rectal stump/Hartmann's pouch. 2. Stable enlarged left lower paraesophageal lymph node. 3. Increased size of the treated pulmonary nodule in the lingula now measuring 6 mm previously 4 mm with increased in size favored artifactual given motion degrading examination of the lung bases. Suggest attention on follow-up imaging 4. Unchanged size of  the treated pulmonary nodule in the right middle lobe measuring 4 mm. 5. Increased size of prominent bilateral axillary lymph nodes measuring up to 7 mm in short axis, nonspecific. Suggest attention on follow-up imaging 6. No convincing evidence of new or progressive disease in the chest, abdomen or pelvis. 7.  Aortic Atherosclerosis (ICD10-I70.0).     10/06/2023 Imaging   CT chest abdomen pelvis with contrast IMPRESSION: 1. Increased size of the pulmonary nodule in the lingula now measuring 7 mm, concerning for pulmonary metastatic disease recurrence. 2. Additional bilateral pulmonary nodules are stable. 3. Stable prominent bilateral axillary, low paraesophageal and retroperitoneal lymph nodes. 4. Prior partial left hemicolectomy with Hartmann's pouch formation and left anterior abdominal wall colostomy. No suspicious nodularity along the Hartmann's suture line. 5. Patulous esophagus with symmetric distal esophageal wall thickening, similar prior. 6. Trace right pleural effusion. 7.  Aortic Atherosclerosis (ICD10-I70.0).     12/30/2023 Imaging   CT chest, abdomen, and pelvis with contrast IMPRESSION: 1. Increased size of bilateral pulmonary nodules with new 4 mm left upper lobe and 5 mm superior segment right lower lobe pulmonary nodules, compatible with worsening pulmonary metastatic disease. 2. Stable prominent bilateral axillary and mediastinal lymph nodes. 3. Stable coarsely calcified retroperitoneal lymph nodes measuring up to 10 mm in short axis. No new pathologically enlarged abdominal or pelvic lymph nodes. 4. Stable trace right pleural effusion. 5. Prior partial left hemicolectomy with Hartmann's pouch formation and left anterior abdominal wall colostomy. No new suspicious nodularity along the suture line.      Discussed the use of AI scribe software for clinical note transcription with the patient, who gave verbal consent to proceed.  History of Present Illness Matthew [name]  is a 58 year old male with metastatic colon cancer who presents for follow-up.  He is on a chemotherapy  schedule with sessions planned for November 17 and July 03, 2024. No new symptoms have emerged in the past two weeks. His weight is stable, and he maintains a good appetite without diarrhea.  He has experienced bleeding from the ostomy site, described as significant by his caregiver, occurring a few times but not recently. The bleeding was not associated with stool and was observed at the ostomy opening. He has not consulted an ostomy nurse for this issue.  He takes potassium supplements twice daily, with his caregiver ensuring adherence to the regimen.     All other systems were reviewed with the patient and are negative.  MEDICAL HISTORY:  Past Medical History:  Diagnosis Date   Cancer (HCC)    Schizophrenia [facility])     SURGICAL HISTORY: Past Surgical History:  Procedure Laterality Date   BRONCHIAL BIOPSY  05/11/2022   Procedure: BRONCHIAL BIOPSIES;  Surgeon: Shelah Lamar RAMAN, MD;  Location: [facility] ENDOSCOPY;  Service: Pulmonary;;   BRONCHIAL BRUSHINGS  05/11/2022   Procedure: BRONCHIAL BRUSHINGS;  Surgeon: Shelah Lamar RAMAN, MD;  Location: [facility] ENDOSCOPY;  Service: Pulmonary;;   BRONCHIAL NEEDLE ASPIRATION BIOPSY  05/11/2022   Procedure: BRONCHIAL NEEDLE ASPIRATION BIOPSIES;  Surgeon: Shelah Lamar RAMAN, MD;  Location[facility] ENDOSCOPY;  Service: Pulmonary;;   BRONCHIAL WASHINGS  05/11/2022   Procedure: BRONCHIAL WASHINGS;  Surgeon: Shelah Lamar RAMAN, MD;  Location: [facility] ENDOSCOPY;  Service: Pulmonary;;   IR IMAGING GUIDED PORT INSERTION  05/25/2022   LAPAROTOMY N/A 08/08/2021   Procedure: EXPLORATORY LAPAROTOMY;  Surgeon: [name], MD;  Location: [facility] OR;  Service: General;  Laterality: N/A;   PARTIAL COLECTOMY N/A 08/08/2021   Procedure: PARTIAL COLECTOMY WITH COLOSTOMY;  Surgeon: [name], MD;  Location: [facility] OR;  Service: General;  Laterality: N/A;   VIDEO BRONCHOSCOPY WITH RADIAL  ENDOBRONCHIAL ULTRASOUND  05/11/2022   Procedure: VIDEO BRONCHOSCOPY WITH RADIAL ENDOBRONCHIAL ULTRASOUND;  Surgeon: Shelah Lamar RAMAN, MD;  Location: [facility] ENDOSCOPY;  Service: Pulmonary;;    I have reviewed the social history and family history with the patient and they are unchanged from previous note.  ALLERGIES:  has no known allergies.  MEDICATIONS:  Current Outpatient Medications  Medication Sig Dispense Refill   acetaminophen  (TYLENOL ) 500 MG tablet Take 2 tablets (1,000 mg total) by mouth every 6 (six) hours as needed for mild pain or moderate pain.  0   apixaban  (ELIQUIS ) 5 MG TABS tablet Take 1 tablet (5 mg total) by mouth 2 (two) times daily. 60 tablet 2   aspirin EC 81 MG tablet Take 81 mg by mouth daily as needed (for pain or headaches).      benztropine  (COGENTIN ) 1 MG tablet Take 1 mg at bedtime by mouth.     clindamycin  (CLINDAGEL) 1 % gel Apply topically 2 (two) times daily. To skin rash on face and upper body 30 g 0   ferrous sulfate  325 (65 FE) MG EC tablet Take 1 tablet (325 mg total) by mouth daily. 30 tablet 3   hydrocortisone  cream 1 % Apply 1 Application topically 2 (two) times daily as needed for itching. For rash 30 g 2   loperamide  (IMODIUM ) 2 MG capsule Take 1 capsule (2 mg total) by mouth as needed for diarrhea or loose stools. 30 capsule 0   magnesium  oxide (MAG-OX) 400 (240 Mg) MG tablet Take 1 tablet (400 mg total) by mouth 2 (two) times daily. 30 tablet 2   Multiple Vitamin (MULTIVITAMIN WITH MINERALS) TABS tablet Take 1 tablet by  mouth daily.     ondansetron  (ZOFRAN ) 8 MG tablet Take 1 tablet (8 mg total) by mouth every 8 (eight) hours as needed for nausea or vomiting. 30 tablet 0   paliperidone  (INVEGA  SUSTENNA) 156 MG/ML SUSP injection Inject 156 mg every 30 (thirty) days into the muscle.     polyethylene glycol (MIRALAX  / GLYCOLAX ) 17 g packet Take 17 g by mouth daily as needed for mild constipation or moderate constipation.  0   prochlorperazine  (COMPAZINE ) 10  MG tablet TAKE 1 TABLET(10 MG) BY MOUTH EVERY 6 HOURS AS NEEDED FOR NAUSEA OR VOMITING 30 tablet 0   potassium chloride  SA (KLOR-CON  M) 20 MEQ tablet Take 1 tablet (20 mEq total) by mouth 2 (two) times daily. 60 tablet 2   No current facility-administered medications for this visit.    PHYSICAL EXAMINATION: ECOG PERFORMANCE STATUS: 2 - Symptomatic, <50% confined to bed  Vitals:   06/05/24 1000  BP: 109/74  Pulse: (!) 105  Resp: 15  Temp: 98.4 F (36.9 C)  SpO2: 100%   Wt Readings from Last 3 Encounters:  06/05/24 143 lb 12.8 oz (65.2 kg)  05/22/24 142 lb 11.2 oz (64.7 kg)  05/08/24 139 lb 11.2 oz (63.4 kg)     GENERAL:alert, no distress and comfortable SKIN: skin color, texture, turgor are normal, no rashes or significant lesions EYES: normal, Conjunctiva are pink and non-injected, sclera clear NECK: supple, thyroid normal size, non-tender, without nodularity LYMPH:  no palpable lymphadenopathy in the cervical, axillary  LUNGS: clear to auscultation and percussion with normal breathing effort HEART: regular rate & rhythm and no murmurs and no lower extremity edema ABDOMEN:abdomen soft, non-tender and normal bowel sounds Musculoskeletal:no cyanosis of digits and no clubbing  NEURO: alert & oriented x 3 with fluent speech, no focal motor/sensory deficits  Physical Exam    LABORATORY DATA:  I have reviewed the data as listed    Latest Ref Rng & Units 06/05/2024   10:07 AM 05/22/2024   11:30 AM 05/08/2024   11:17 AM  CBC  WBC 4.0 - 10.5 K/uL 6.4  4.0  4.1   Hemoglobin 13.0 - 17.0 g/dL 89.1  89.3  87.9   Hematocrit 39.0 - 52.0 % 32.3  32.5  35.8   Platelets 150 - 400 K/uL 344  302  322         Latest Ref Rng & Units 06/05/2024   10:07 AM 05/22/2024   11:30 AM 05/08/2024   11:17 AM  CMP  Glucose 70 - 99 mg/dL 868  897  94   BUN 6 - 20 mg/dL 12  13  14    Creatinine 0.61 - 1.24 mg/dL 9.28  9.22  9.26   Sodium 135 - 145 mmol/L 137  138  140   Potassium 3.5 - 5.1  mmol/L 3.1  3.7  3.5   Chloride 98 - 111 mmol/L 107  108  109   CO2 22 - 32 mmol/L 24  24  24    Calcium  8.9 - 10.3 mg/dL 8.8  9.1  9.5   Total Protein 6.5 - 8.1 g/dL 6.2  5.9  6.5   Total Bilirubin 0.0 - 1.2 mg/dL 0.6  0.6  0.7   Alkaline Phos 38 - 126 U/L 112  91  93   AST 15 - 41 U/L 13  17  12    ALT 0 - 44 U/L 11  14  8        RADIOGRAPHIC STUDIES: I have personally reviewed the  radiological images as listed and agreed with the findings in the report. No results found.    Orders Placed This Encounter  Procedures   CT CHEST ABDOMEN PELVIS W CONTRAST    Standing Status:   Future    Expected Date:   07/10/2024    Expiration Date:   06/05/2025    If indicated for the ordered procedure, I authorize the administration of contrast media per Radiology protocol:   Yes    Does the patient have a contrast media/X-ray dye allergy?:   No    Preferred imaging location?:   [facility]    If indicated for the ordered procedure, I authorize the administration of oral contrast media per Radiology protocol:   Yes   Amb Referral to [facility]    Referral Priority:   Routine    Referral Type:   Consultation    Referral Reason:   Specialty Services Required    Number of Visits Requested:   1   All questions were answered. The patient knows to call the clinic with any problems, questions or concerns. No barriers to learning was detected. The total time spent in the appointment was 30 minutes, including review of chart and various tests results, discussions about plan of care and coordination of care plan     Onita Mattock, MD 06/05/2024

## 2024-06-07 ENCOUNTER — Inpatient Hospital Stay: Payer: MEDICAID

## 2024-06-08 ENCOUNTER — Other Ambulatory Visit: Payer: Self-pay

## 2024-06-13 ENCOUNTER — Ambulatory Visit (HOSPITAL_COMMUNITY): Payer: MEDICAID | Admitting: Nurse Practitioner

## 2024-06-18 ENCOUNTER — Other Ambulatory Visit: Payer: Self-pay | Admitting: Nurse Practitioner

## 2024-06-18 DIAGNOSIS — C186 Malignant neoplasm of descending colon: Secondary | ICD-10-CM

## 2024-06-18 NOTE — Progress Notes (Unsigned)
 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  -He had a metastatic recurrence in the lungs and retroperitoneal adenopathy in September 2023 -he started FOLFIRI on 05/08/2022, Vectibix  added from cycle 2, tolerating well overall -restaging CT scan image from August 02, 2022 and 10/28/2022 both showed good partial response in pulmonary metastasis, no other new lesions. -we have changed his treatment to maintenance therapy with 5-fu and vectibix  in late March 2024 -He is tolerating treatment very well, will continue. -due to metastasis in both lungs and retroperitoneal lymph nodes, he is not a candidate for surgery or local therapy  -restaging CT 10/06/2023 showed stable disease overall except increased size of the pulmonary nodule in the lingula now measuring 7 mm Will continue current therapy. - Restaging CT scan on May 29th 2025 showed mild disease progression in the lung, I changed his chemotherapy back to FOLFIRI and Vectibix  on 01/03/2024 -CT 04/04/2024 showed stable disease, will continue current therapy  -06/19/2024 -continues to tolerate chemotherapy FOLFIRI and Vectibix  well.  Due for restaging scan in early to mid December.  Scan has been ordered but not scheduled.  Patient's mother will reach out to radiology to schedule. -- Proceed with chemotherapy FOLFIRI and Vectibix  every 2 weeks as scheduled.   Metastatic colon cancer Patient continues treatment wi ht  FOLFIRI and Vectibix . Overall, tolerating well with no reported negative side effects. Restaging CT CAP ordered 06/05/2024. This is not yet scheduled. Advised patient's mother to call radiology to schedule. Requested for early December.   Hypomagnesemia Magnesium  level is 1.6 today. He is scheduled to receive 4 gm magnesium  today. He will receive additional 2gm on day of pump d/c. Will continue to monitor this closely.   Anemia Mild and stable anemia noted. Will monitor closely with every visit and treat as indicated.   Plan Patient seen in infusion suite today. Labs reviewed.  -mild and stable anemia -potassium has normalized to 3.5. -magnesium  1.6 today Restaging scan to be scheduled in early/mid December. Ordered at most recent visit 06/05/2024.  Labs and patient presentation are appropriate for treatment.  Proceed with chemotherapy FOLFIRI and Vectibix . Labs/flush, follow up, and treatment every 2 weeks.    The patient understands the plans discussed today and is in agreement with them.  He knows to contact our office if he develops concerns prior to his next appointment.  I provided 25 minutes of face-to-face time during this encounter and > 50% was spent counseling as documented under my assessment and plan.    Matthew FORBES Lessen, NP  [name] - A DEPT OF [facility] [address] [place] [address] Dept: [phone] Dept Fax: [phone]   No orders of the defined types were placed in this encounter.     CHIEF COMPLAINT:  CC: Cancer of left colon  Current Treatment: Chemotherapy for.  He Vectibix  every 2 weeks  INTERVAL HISTORY:  Matthew [name] is here today for repeat clinical assessment.  He last saw Dr. Lanny on 06/05/2024.  He continues to tolerate treatment well.  Denies peripheral neuropathy.  Denies excess fatigue.  His mom, present with him today, does state that patient sleeps a lot.  This is not  new.  Patient is due for restaging CT CAP.  This was ordered at last visit.  Talked with mother about coming radiology to schedule this for early to mid December.  Patient states his skin is doing well.  Acneform rash is controlled.  He denies chest pain, chest pressure, or shortness of breath. He denies headaches or visual disturbances. He denies abdominal pain, nausea, vomiting, or changes in bowel or bladder habits.  He denies fevers or chills. He denies pain. His appetite is good. His weight has been stable.  I have reviewed the past medical history, past surgical history, social history and family history with the patient and they are unchanged from previous note.  ALLERGIES:  has no known allergies.  MEDICATIONS:  Current Outpatient Medications  Medication Sig Dispense Refill   acetaminophen  (TYLENOL ) 500 MG tablet Take 2 tablets (1,000 mg total) by mouth every 6 (six) hours as needed for mild pain or moderate pain.  0   apixaban  (ELIQUIS ) 5 MG TABS tablet Take 1 tablet (5 mg total) by mouth 2 (two) times daily. 60 tablet 2   aspirin EC 81 MG tablet Take 81 mg by mouth daily as needed (for pain or headaches).      benztropine  (COGENTIN ) 1 MG tablet Take 1 mg at bedtime by mouth.     clindamycin  (CLINDAGEL) 1 % gel Apply topically 2 (two) times daily. To skin rash on face and upper body 30 g 0   ferrous sulfate  325 (65 FE) MG EC tablet Take 1 tablet (325 mg total) by mouth daily. 30 tablet 3   hydrocortisone  cream 1 % Apply 1 Application topically 2 (two) times daily as needed for itching. For rash 30 g 2   loperamide  (IMODIUM ) 2 MG capsule Take 1 capsule (2 mg total) by mouth as needed for diarrhea or loose stools. 30 capsule 0   magnesium  oxide (MAG-OX) 400 (240 Mg) MG tablet Take 1 tablet (400 mg total) by mouth 2 (two) times daily. 30 tablet 2   Multiple Vitamin (MULTIVITAMIN WITH MINERALS) TABS tablet Take 1 tablet by mouth daily.     ondansetron  (ZOFRAN ) 8 MG tablet Take 1 tablet (8 mg  total) by mouth every 8 (eight) hours as needed for nausea or vomiting. 30 tablet 0   paliperidone  (INVEGA  SUSTENNA) 156 MG/ML SUSP injection Inject 156 mg every 30 (thirty) days into the muscle.     polyethylene glycol (MIRALAX  / GLYCOLAX ) 17 g packet Take 17 g by mouth daily as needed for mild constipation or moderate constipation.  0   potassium chloride  SA (KLOR-CON  M) 20 MEQ tablet Take 1 tablet (20 mEq total) by mouth 2 (two) times daily. 60 tablet 2   prochlorperazine  (COMPAZINE ) 10 MG tablet TAKE 1 TABLET(10 MG) BY MOUTH EVERY 6 HOURS AS NEEDED FOR NAUSEA OR VOMITING 30 tablet 0   No current facility-administered medications for this visit.   Facility-Administered Medications Ordered in Other Visits  Medication Dose Route Frequency Provider Last Rate Last Admin   atropine  injection 0.5 mg  0.5 mg Intravenous Once PRN Matthew Callander, MD       fluorouracil  (ADRUCIL ) 4,350 mg in sodium chloride  0.9 % 63 mL chemo infusion  2,400 mg/m2 (Treatment Plan Recorded) Intravenous 1 day or  1 dose Matthew Callander, MD       irinotecan  (CAMPTOSAR ) 320 mg in sodium chloride  0.9 % 500 mL chemo infusion  180 mg/m2 (Treatment Plan Recorded) Intravenous Once Matthew Callander, MD       leucovorin  728 mg in sodium chloride  0.9 % 250 mL infusion  400 mg/m2 (Treatment Plan Recorded) Intravenous Once Matthew Callander, MD       panitumumab  (VECTIBIX ) 400 mg in sodium chloride  0.9 % 100 mL chemo infusion  6 mg/kg (Treatment Plan Recorded) Intravenous Once Matthew Callander, MD        HISTORY OF PRESENT ILLNESS:   Oncology History Overview Note   Cancer Staging  Cancer of left colon [facility]) Staging form: Colon and Rectum, AJCC 8th Edition - Pathologic stage from 08/08/2021: Stage IIIB (pT3, pN1a, cM0) - Signed by Matthew Callander, MD on 08/26/2021    Cancer of left colon (HCC)  04/01/2020 Imaging   IMPRESSION: 1. Gallbladder decompressed bowel also partially calcified gallstones. No pericholecystic inflammation though if there is persisting clinical  concern for cholecystitis right upper quadrant ultrasound could be obtained. 2. Circumferential thickening of the distal thoracic esophagus. Could reflect features of esophagitis. Correlate with clinical symptoms and consider endoscopy as clinically warranted. 3. Additional segmental thickening of the mid to distal sigmoid with focal narrowing. No acute surrounding inflammation or resulting obstruction. Findings are nonspecific, and could reflect sequela of prior inflammation/infection. However, recommend correlation with colonoscopy if not recently performed. 4. Mild circumferential bladder wall thickening and indentation of the bladder base by an enlarged prostate. Possibly sequela of chronic outlet obstruction though could correlate with urinalysis to exclude cystitis. 5. Aortic Atherosclerosis (ICD10-I70.0).   08/06/2021 Imaging   IMPRESSION: Sigmoid colonic perforation with small free intraperitoneal gas and infiltration of the mesenteric and omental fat in keeping with changes of peritonitis.   Long segment inflammatory stranding of the sigmoid colon in keeping with a severe infectious or inflammatory colitis. This terminates an area of irregular mural thickening, infiltrative soft tissue within the colonic mesentery, and focal dystrophic calcification. This may represent a chronic inflammatory process, however, a perforated malignancy could appear similarly. There are 2 separate points of perforation which again raise the question of an underlying malignancy.   7.1 cm gas and fluid containing pericolonic abscess within the sigmoid mesentery.   Marked inflammatory change of the terminal ileum adjacent to the pericolonic abscess with resultant small bowel obstruction. Fluid within the distal esophagus likely relates to gastroesophageal reflux the setting of vomiting.   Aortic Atherosclerosis (ICD10-I70.0).   08/08/2021 Cancer Staging   Staging form: Colon and Rectum, AJCC 8th  Edition - Pathologic stage from 08/08/2021: Stage IIIB (pT3, pN1a, cM0) - Signed by Matthew Callander, MD on 08/26/2021 Stage prefix: Initial diagnosis Total positive nodes: 1 Histologic grading system: 4 grade system Histologic grade (G): G2 Residual tumor (R): R0 - None   08/08/2021 Definitive Surgery   FINAL MICROSCOPIC DIAGNOSIS:   A. COLON, SIGMOID, PARTIAL COLECTOMY:  - Invasive moderately differentiated adenocarcinoma.  - Metastatic carcinoma involving one of twelve lymph nodes (1/12).  - See oncology table below.   ADDENDUM:  Mismatch Repair Protein (IHC)  SUMMARY INTERPRETATION: NORMAL    08/14/2021 Imaging   EXAM: CT ABDOMEN AND PELVIS WITH CONTRAST  IMPRESSION: 1. Post recent sigmoid colectomy with left lower quadrant colostomy. Two small residual foci of air within the pelvic mesentery with mild adjacent thickening, but no abscess or drainable collection. Trace non organized free fluid and stranding in the pelvis. 2.  Short segment of small bowel wall thickening and inflammation in the pelvis involving the distal ileum, likely reactive. 3. Dilated distal esophagus, stomach, and small bowel, without discrete transition point, favoring postoperative ileus. 4. Small bilateral pleural effusions and compressive atelectasis. 5. Heterogeneous partially enhancing 14 mm lymph node in the retroperitoneum at the aortoiliac bifurcation, not significantly changed from prior exam. Suspected additional lymph nodes in the anterior common iliac space, not significantly changed from prior exam. Recommend attention at follow-up. 6. Additional chronic findings as described.     08/15/2021 Imaging   EXAM: CT CHEST WITH CONTRAST  IMPRESSION: 1. No evidence of thoracic metastasis. 2. Bilateral small layering pleural effusions with passive atelectasis   08/26/2021 Initial Diagnosis   Cancer of left colon (HCC)   10/10/2021 - 12/12/2021 Chemotherapy   Patient is on Treatment Plan : COLORECTAL  Xelox ([place]ox) q21d     05/28/2022 - 05/28/2022 Chemotherapy   Patient is on Treatment Plan : COLORECTAL FOLFIRI + Bevacizumab q14d     05/28/2022 -  Chemotherapy   Patient is on Treatment Plan : COLORECTAL FOLFIRI + Panitumumab  q14d     07/31/2022 Imaging    IMPRESSION: Decreased bilateral pulmonary metastases.   Stable mild abdominal lymphadenopathy.   No new or progressive metastatic disease within the chest, abdomen, or pelvis.   01/18/2023 Imaging    IMPRESSION: 1. Status post Hetty pouch sigmoid colon resection with left lower quadrant end colostomy and rectal stump. Unchanged, mild wall thickening of the decompressed distal colon at the ileostomy, as well as the rectal stump. 2. Unchanged small, treated metastatic pulmonary nodules. No new nodules. 3. Unchanged enlarged, coarsely calcified treated lower aortocaval metastatic lymph nodes. No new lymphadenopathy. 4. Coronary artery disease. 5. Cholelithiasis.   04/23/2023 Imaging   CT chest, abdomen, and pelvis with contrast  IMPRESSION: 1. Left lower lobe segmental pulmonary emboli, incidental finding. No evidence for right heart strain. 2. Stable trace bilateral pleural effusions. 3. Stable enlarged lower left paraesophageal lymph node. 4. Stable calcified retroperitoneal lymph nodes. No new enlarged lymph nodes in the chest, abdomen or pelvis. 5. Stable subcentimeter soft tissue nodules within the mesenteric fat along the inferior ostomy. 6. Left Bosniak I benign renal cyst measuring 4.6 cm. No follow-up imaging is recommended. JACR 2018 Feb; 264-273, Management of the Incidental Renal Mass on CT, [employer] 2021; 814-848, Bosniak Classification of Cystic Renal Masses, Version 2019.   07/15/2023 Imaging   CT chest abdomen and pelvis with contrast IMPRESSION: 1. Prior partial left hemicolectomy with end colostomy in the left anterior abdominal wall. Unchanged mild wall thickening of the decompressed distal  colon at the ileostomy as well as the rectal stump/Hartmann's pouch. 2. Stable enlarged left lower paraesophageal lymph node. 3. Increased size of the treated pulmonary nodule in the lingula now measuring 6 mm previously 4 mm with increased in size favored artifactual given motion degrading examination of the lung bases. Suggest attention on follow-up imaging 4. Unchanged size of the treated pulmonary nodule in the right middle lobe measuring 4 mm. 5. Increased size of prominent bilateral axillary lymph nodes measuring up to 7 mm in short axis, nonspecific. Suggest attention on follow-up imaging 6. No convincing evidence of new or progressive disease in the chest, abdomen or pelvis. 7.  Aortic Atherosclerosis (ICD10-I70.0).     10/06/2023 Imaging   CT chest abdomen pelvis with contrast IMPRESSION: 1. Increased size of the pulmonary nodule in the lingula now measuring 7 mm, concerning for pulmonary metastatic disease recurrence. 2. Additional  bilateral pulmonary nodules are stable. 3. Stable prominent bilateral axillary, low paraesophageal and retroperitoneal lymph nodes. 4. Prior partial left hemicolectomy with Hartmann's pouch formation and left anterior abdominal wall colostomy. No suspicious nodularity along the Hartmann's suture line. 5. Patulous esophagus with symmetric distal esophageal wall thickening, similar prior. 6. Trace right pleural effusion. 7.  Aortic Atherosclerosis (ICD10-I70.0).     12/30/2023 Imaging   CT chest, abdomen, and pelvis with contrast IMPRESSION: 1. Increased size of bilateral pulmonary nodules with new 4 mm left upper lobe and 5 mm superior segment right lower lobe pulmonary nodules, compatible with worsening pulmonary metastatic disease. 2. Stable prominent bilateral axillary and mediastinal lymph nodes. 3. Stable coarsely calcified retroperitoneal lymph nodes measuring up to 10 mm in short axis. No new pathologically enlarged abdominal or pelvic lymph  nodes. 4. Stable trace right pleural effusion. 5. Prior partial left hemicolectomy with Hartmann's pouch formation and left anterior abdominal wall colostomy. No new suspicious nodularity along the suture line.       REVIEW OF SYSTEMS:   Constitutional: Denies fevers, chills or abnormal weight loss Eyes: Denies blurriness of vision Ears, nose, mouth, throat, and face: Denies mucositis or sore throat Respiratory: Denies cough, dyspnea or wheezes Cardiovascular: Denies palpitation, chest discomfort or lower extremity swelling Gastrointestinal:  Denies nausea, heartburn or change in bowel habits Skin: Denies abnormal skin rashes.  Reports improved control of acneform rash, especially on his face. Lymphatics: Denies new lymphadenopathy or easy bruising Neurological:Denies numbness, tingling or new weaknesses Behavioral/Psych: Mood is stable, no new changes  All other systems were reviewed with the patient and are negative.   VITALS:   Today's Vitals   06/19/24 1312  BP: 110/74  Pulse: 98  Resp: 17  Temp: 98.2 F (36.8 C)  SpO2: 100%  Weight: 141 lb 3.2 oz (64 kg)   Body mass index is 20.85 kg/m.   Wt Readings from Last 3 Encounters:  06/19/24 141 lb 2 oz (64 kg)  06/19/24 141 lb 3.2 oz (64 kg)  06/05/24 143 lb 12.8 oz (65.2 kg)    Body mass index is 20.85 kg/m.  Performance status (ECOG): 1 - Symptomatic but completely ambulatory  PHYSICAL EXAM:   GENERAL:alert, no distress and comfortable SKIN: skin color, texture, turgor are normal, no rashes or significant lesions EYES: normal, Conjunctiva are pink and non-injected, sclera clear OROPHARYNX:no exudate, no erythema and lips, buccal mucosa, and tongue normal  NECK: supple, thyroid normal size, non-tender, without nodularity LYMPH:  no palpable lymphadenopathy in the cervical, axillary or inguinal LUNGS: clear to auscultation and percussion with normal breathing effort HEART: regular rate & rhythm and no murmurs and  no lower extremity edema ABDOMEN:abdomen soft, non-tender and normal bowel sounds. Intact colostomy. Musculoskeletal:no cyanosis of digits and no clubbing  NEURO: alert & oriented x 3 with fluent speech, no focal motor/sensory deficits  LABORATORY DATA:  I have reviewed the data as listed    Component Value Date/Time   NA 139 06/19/2024 1018   K 3.5 06/19/2024 1018   CL 108 06/19/2024 1018   CO2 24 06/19/2024 1018   GLUCOSE 99 06/19/2024 1018   BUN 9 06/19/2024 1018   CREATININE 0.63 06/19/2024 1018   CALCIUM  9.3 06/19/2024 1018   PROT 6.4 (L) 06/19/2024 1018   ALBUMIN 3.8 06/19/2024 1018   AST 13 (L) 06/19/2024 1018   ALT 14 06/19/2024 1018   ALKPHOS 111 06/19/2024 1018   BILITOT 0.7 06/19/2024 1018   GFRNONAA >60 06/19/2024 1018  GFRAA >60 04/01/2020 1237    Lab Results  Component Value Date   WBC 3.8 (L) 06/19/2024   NEUTROABS 2.4 06/19/2024   HGB 11.5 (L) 06/19/2024   HCT 35.1 (L) 06/19/2024   MCV 81.8 06/19/2024   PLT 369 06/19/2024

## 2024-06-18 NOTE — Assessment & Plan Note (Signed)
 stage IIIB p(T3, N1aM0), MSS, KRAS/NRAS/BRAF wild type, lung and node metastasis in 04/2022  -Initially diagnosed in 08/2021 -he completed 4 cycles adjuvant CAPOX 10/10/21 - 12/25/21 (though he somehow was still taking Xeloda  through ~02/09/22 due to misunderstandings).  -He had a metastatic recurrence in the lungs and retroperitoneal adenopathy in September 2023 -he started FOLFIRI on 05/08/2022, Vectibix  added from cycle 2, tolerating well overall -restaging CT scan image from August 02, 2022 and 10/28/2022 both showed good partial response in pulmonary metastasis, no other new lesions. -we have changed his treatment to maintenance therapy with 5-fu and vectibix  in late March 2024 -He is tolerating treatment very well, will continue. -due to metastasis in both lungs and retroperitoneal lymph nodes, he is not a candidate for surgery or local therapy  -restaging CT 10/06/2023 showed stable disease overall except increased size of the pulmonary nodule in the lingula now measuring 7 mm Will continue current therapy. - Restaging CT scan on May 29th 2025 showed mild disease progression in the lung, I changed his chemotherapy back to FOLFIRI and Vectibix  on 01/03/2024 -CT 04/04/2024 showed stable disease, will continue current therapy  -06/19/2024 -continues to tolerate chemotherapy FOLFIRI and Vectibix  well.  Due for restaging scan in early to mid December.  Scan has been ordered but not scheduled.  Patient's mother will reach out to radiology to schedule. -- Proceed with chemotherapy FOLFIRI and Vectibix  every 2 weeks as scheduled.

## 2024-06-19 ENCOUNTER — Inpatient Hospital Stay: Payer: MEDICAID

## 2024-06-19 ENCOUNTER — Inpatient Hospital Stay: Payer: MEDICAID | Admitting: Nurse Practitioner

## 2024-06-19 ENCOUNTER — Encounter: Payer: Self-pay | Admitting: Nurse Practitioner

## 2024-06-19 VITALS — BP 110/74 | HR 98 | Temp 98.2°F | Resp 17 | Wt 141.1 lb

## 2024-06-19 VITALS — BP 110/74 | HR 98 | Temp 98.2°F | Resp 17 | Wt 141.2 lb

## 2024-06-19 DIAGNOSIS — C186 Malignant neoplasm of descending colon: Secondary | ICD-10-CM | POA: Diagnosis not present

## 2024-06-19 DIAGNOSIS — D500 Iron deficiency anemia secondary to blood loss (chronic): Secondary | ICD-10-CM

## 2024-06-19 LAB — CBC WITH DIFFERENTIAL (CANCER CENTER ONLY)
Abs Immature Granulocytes: 0.01 K/uL (ref 0.00–0.07)
Basophils Absolute: 0 K/uL (ref 0.0–0.1)
Basophils Relative: 1 %
Eosinophils Absolute: 0.2 K/uL (ref 0.0–0.5)
Eosinophils Relative: 4 %
HCT: 35.1 % — ABNORMAL LOW (ref 39.0–52.0)
Hemoglobin: 11.5 g/dL — ABNORMAL LOW (ref 13.0–17.0)
Immature Granulocytes: 0 %
Lymphocytes Relative: 20 %
Lymphs Abs: 0.8 K/uL (ref 0.7–4.0)
MCH: 26.8 pg (ref 26.0–34.0)
MCHC: 32.8 g/dL (ref 30.0–36.0)
MCV: 81.8 fL (ref 80.0–100.0)
Monocytes Absolute: 0.4 K/uL (ref 0.1–1.0)
Monocytes Relative: 10 %
Neutro Abs: 2.4 K/uL (ref 1.7–7.7)
Neutrophils Relative %: 65 %
Platelet Count: 369 K/uL (ref 150–400)
RBC: 4.29 MIL/uL (ref 4.22–5.81)
RDW: 16.8 % — ABNORMAL HIGH (ref 11.5–15.5)
WBC Count: 3.8 K/uL — ABNORMAL LOW (ref 4.0–10.5)
nRBC: 0 % (ref 0.0–0.2)

## 2024-06-19 LAB — CMP (CANCER CENTER ONLY)
ALT: 14 U/L (ref 0–44)
AST: 13 U/L — ABNORMAL LOW (ref 15–41)
Albumin: 3.8 g/dL (ref 3.5–5.0)
Alkaline Phosphatase: 111 U/L (ref 38–126)
Anion gap: 7 (ref 5–15)
BUN: 9 mg/dL (ref 6–20)
CO2: 24 mmol/L (ref 22–32)
Calcium: 9.3 mg/dL (ref 8.9–10.3)
Chloride: 108 mmol/L (ref 98–111)
Creatinine: 0.63 mg/dL (ref 0.61–1.24)
GFR, Estimated: >60 mL/min (ref >60)
Glucose, Bld: 99 mg/dL (ref 70–99)
Potassium: 3.5 mmol/L (ref 3.5–5.1)
Sodium: 139 mmol/L (ref 135–145)
Total Bilirubin: 0.7 mg/dL (ref 0.0–1.2)
Total Protein: 6.4 g/dL — ABNORMAL LOW (ref 6.5–8.1)

## 2024-06-19 LAB — FERRITIN: Ferritin: 62 ng/mL (ref 24–336)

## 2024-06-19 LAB — MAGNESIUM: Magnesium: 1.6 mg/dL — ABNORMAL LOW (ref 1.7–2.4)

## 2024-06-19 MED ORDER — ATROPINE SULFATE 1 MG/ML IV SOLN
0.5000 mg | Freq: Once | INTRAVENOUS | Status: AC | PRN
  Administered 2024-06-19: 0.5 mg via INTRAVENOUS
  Filled 2024-06-19: qty 1

## 2024-06-19 MED ORDER — SODIUM CHLORIDE 0.9 % IV SOLN: Freq: Once | INTRAVENOUS | Status: AC

## 2024-06-19 MED ORDER — SODIUM CHLORIDE 0.9 % IV SOLN
2400.0000 mg/m2 | INTRAVENOUS | Status: DC
  Administered 2024-06-19: 4350 mg via INTRAVENOUS
  Filled 2024-06-19: qty 87

## 2024-06-19 MED ORDER — SODIUM CHLORIDE 0.9 % IV SOLN
180.0000 mg/m2 | Freq: Once | INTRAVENOUS | Status: AC
  Administered 2024-06-19: 320 mg via INTRAVENOUS
  Filled 2024-06-19: qty 15

## 2024-06-19 MED ORDER — SODIUM CHLORIDE 0.9 % IV SOLN
6.0000 mg/kg | Freq: Once | INTRAVENOUS | Status: AC
  Administered 2024-06-19: 400 mg via INTRAVENOUS
  Filled 2024-06-19: qty 20

## 2024-06-19 MED ORDER — DEXAMETHASONE SOD PHOSPHATE PF 10 MG/ML IJ SOLN
10.0000 mg | Freq: Once | INTRAMUSCULAR | Status: AC
  Administered 2024-06-19: 10 mg via INTRAVENOUS

## 2024-06-19 MED ORDER — PALONOSETRON HCL INJECTION 0.25 MG/5ML
0.2500 mg | Freq: Once | INTRAVENOUS | Status: AC
  Administered 2024-06-19: 0.25 mg via INTRAVENOUS
  Filled 2024-06-19: qty 5

## 2024-06-19 MED ORDER — SODIUM CHLORIDE 0.9 % IV SOLN
400.0000 mg/m2 | Freq: Once | INTRAVENOUS | Status: AC
  Administered 2024-06-19: 728 mg via INTRAVENOUS
  Filled 2024-06-19: qty 25

## 2024-06-19 MED ORDER — MAGNESIUM SULFATE 4 GM/100ML IV SOLN
4.0000 g | Freq: Once | INTRAVENOUS | Status: AC
  Administered 2024-06-19: 4 g via INTRAVENOUS
  Filled 2024-06-19: qty 100

## 2024-06-19 NOTE — Patient Instructions (Signed)
 [facility] - A DEPT OF [facility]   Discharge Instructions: Thank you for choosing [facility] to provide your oncology and hematology care.   If you have a lab appointment with the Cancer Center, please go directly to the Cancer Center and check in at the registration area.   Wear comfortable clothing and clothing appropriate for easy access to any Portacath or PICC line.   We strive to give you quality time with your provider. You may need to reschedule your appointment if you arrive late (15 or more minutes).  Arriving late affects you and other patients whose appointments are after yours.  Also, if you miss three or more appointments without notifying the office, you may be dismissed from the clinic at the provider's discretion.      For prescription refill requests, have your pharmacy contact our office and allow 72 hours for refills to be completed.    Today you received the following chemotherapy and/or immunotherapy agents: Panitumumab  (Vectibix ), Irinotecan , Leucovorin , and Fluorouracil  (Adrucil )      To help prevent nausea and vomiting after your treatment, we encourage you to take your nausea medication as directed.  BELOW ARE SYMPTOMS THAT SHOULD BE REPORTED IMMEDIATELY: *FEVER GREATER THAN 100.4 F (38 C) OR HIGHER *CHILLS OR SWEATING *NAUSEA AND VOMITING THAT IS NOT CONTROLLED WITH YOUR NAUSEA MEDICATION *UNUSUAL SHORTNESS OF BREATH *UNUSUAL BRUISING OR BLEEDING *URINARY PROBLEMS (pain or burning when urinating, or frequent urination) *BOWEL PROBLEMS (unusual diarrhea, constipation, pain near the anus) TENDERNESS IN MOUTH AND THROAT WITH OR WITHOUT PRESENCE OF ULCERS (sore throat, sores in mouth, or a toothache) UNUSUAL RASH, SWELLING OR PAIN  UNUSUAL VAGINAL DISCHARGE OR ITCHING   Items with * indicate a potential emergency and should be followed up as soon as possible or go to the Emergency Department if any problems should  occur.  Please show the CHEMOTHERAPY ALERT CARD or IMMUNOTHERAPY ALERT CARD at check-in to the Emergency Department and triage nurse.  Should you have questions after your visit or need to cancel or reschedule your appointment, please contact [facility] - A DEPT OF [facility]  Dept: [phone]  and follow the prompts.  Office hours are 8:00 a.m. to 4:30 p.m. Monday - Friday. Please note that voicemails left after 4:00 p.m. may not be returned until the following business day.  We are closed weekends and major holidays. You have access to a nurse at all times for urgent questions. Please call the main number to the clinic Dept: [phone] and follow the prompts.   For any non-urgent questions, you may also contact your provider using MyChart. We now offer e-Visits for anyone 50 and older to request care online for non-urgent symptoms. For details visit [email or web address].   Also download the MyChart app! Go to the app store, search MyChart, open the app, select , and log in with your MyChart username and password.  The chemotherapy medication bag should finish at 46 hours, 96 hours, or 7 days. For example, if your pump is scheduled for 46 hours and it was put on at 4:00 p.m., it should finish at 2:00 p.m. the day it is scheduled to come off regardless of your appointment time.     Estimated time to finish at    If the display on your pump reads Low Volume and it is beeping, take the batteries out of the pump and come  to the cancer center for it to be taken off.   If the pump alarms go off prior to the pump reading Low Volume then call [phone] and someone can assist you.  If the plunger comes out and the chemotherapy medication is leaking out, please use your home chemo spill kit to clean up the spill. Do NOT use paper towels or other household products.  If you have problems or questions regarding your pump, please call either  [phone] (24 hours a day) or the cancer center Monday-Friday 8:00 a.m.- 4:30 p.m. at the clinic number and we will assist you. If you are unable to get assistance, then go to the nearest Emergency Department and ask the staff to contact the IV team for assistance.

## 2024-06-19 NOTE — Progress Notes (Signed)
 Per Powell Lessen, NP - okay to run magnesium  while labs are pending.

## 2024-06-21 ENCOUNTER — Inpatient Hospital Stay: Payer: MEDICAID

## 2024-06-22 ENCOUNTER — Encounter: Payer: Self-pay | Admitting: Hematology

## 2024-07-02 NOTE — Assessment & Plan Note (Deleted)
 stage IIIB p(T3, N1aM0), MSS, KRAS/NRAS/BRAF wild type, lung and node metastasis in 04/2022  -Initially diagnosed in 08/2021 -he completed 4 cycles adjuvant CAPOX 10/10/21 - 12/25/21 (though he somehow was still taking Xeloda  through ~02/09/22 due to misunderstandings).  -He had a metastatic recurrence in the lungs and retroperitoneal adenopathy in September 2023 -he started FOLFIRI on 05/08/2022, Vectibix  added from cycle 2, tolerating well overall -restaging CT scan image from August 02, 2022 and 10/28/2022 both showed good partial response in pulmonary metastasis, no other new lesions. -we have changed his treatment to maintenance therapy with 5-fu and vectibix  in late March 2024 -He is tolerating treatment very well, will continue. -due to metastasis in both lungs and retroperitoneal lymph nodes, he is not a candidate for surgery or local therapy  -restaging CT 10/06/2023 showed stable disease overall except increased size of the pulmonary nodule in the lingula now measuring 7 mm Will continue current therapy. - Restaging CT scan on May 29th 2025 showed mild disease progression in the lung, I changed his chemotherapy back to FOLFIRI and Vectibix  on 01/03/2024 -CT 04/04/2024 showed stable disease, will continue current therapy

## 2024-07-03 ENCOUNTER — Inpatient Hospital Stay: Payer: MEDICAID

## 2024-07-03 ENCOUNTER — Telehealth: Payer: Self-pay

## 2024-07-03 ENCOUNTER — Other Ambulatory Visit: Payer: Self-pay

## 2024-07-03 ENCOUNTER — Inpatient Hospital Stay: Payer: MEDICAID | Attending: Hematology

## 2024-07-03 ENCOUNTER — Inpatient Hospital Stay: Payer: MEDICAID | Admitting: Hematology

## 2024-07-03 ENCOUNTER — Encounter: Payer: Self-pay | Admitting: Hematology

## 2024-07-03 VITALS — BP 116/83 | HR 84 | Temp 98.9°F | Resp 18 | Wt 143.5 lb

## 2024-07-03 DIAGNOSIS — C186 Malignant neoplasm of descending colon: Secondary | ICD-10-CM

## 2024-07-03 DIAGNOSIS — C787 Secondary malignant neoplasm of liver and intrahepatic bile duct: Secondary | ICD-10-CM | POA: Insufficient documentation

## 2024-07-03 DIAGNOSIS — C772 Secondary and unspecified malignant neoplasm of intra-abdominal lymph nodes: Secondary | ICD-10-CM | POA: Diagnosis not present

## 2024-07-03 DIAGNOSIS — Z79634 Long term (current) use of topoisomerase inhibitor: Secondary | ICD-10-CM | POA: Insufficient documentation

## 2024-07-03 DIAGNOSIS — Z5112 Encounter for antineoplastic immunotherapy: Secondary | ICD-10-CM | POA: Insufficient documentation

## 2024-07-03 DIAGNOSIS — Z5111 Encounter for antineoplastic chemotherapy: Secondary | ICD-10-CM | POA: Insufficient documentation

## 2024-07-03 DIAGNOSIS — Z79899 Other long term (current) drug therapy: Secondary | ICD-10-CM | POA: Diagnosis not present

## 2024-07-03 DIAGNOSIS — C7800 Secondary malignant neoplasm of unspecified lung: Secondary | ICD-10-CM | POA: Insufficient documentation

## 2024-07-03 LAB — CBC WITH DIFFERENTIAL (CANCER CENTER ONLY)
Abs Immature Granulocytes: 0.02 K/uL (ref 0.00–0.07)
Basophils Absolute: 0 K/uL (ref 0.0–0.1)
Basophils Relative: 1 %
Eosinophils Absolute: 0.1 K/uL (ref 0.0–0.5)
Eosinophils Relative: 3 %
HCT: 33 % — ABNORMAL LOW (ref 39.0–52.0)
Hemoglobin: 10.8 g/dL — ABNORMAL LOW (ref 13.0–17.0)
Immature Granulocytes: 0 %
Lymphocytes Relative: 19 %
Lymphs Abs: 0.9 K/uL (ref 0.7–4.0)
MCH: 26.4 pg (ref 26.0–34.0)
MCHC: 32.7 g/dL (ref 30.0–36.0)
MCV: 80.7 fL (ref 80.0–100.0)
Monocytes Absolute: 0.4 K/uL (ref 0.1–1.0)
Monocytes Relative: 9 %
Neutro Abs: 3.3 K/uL (ref 1.7–7.7)
Neutrophils Relative %: 68 %
Platelet Count: 355 K/uL (ref 150–400)
RBC: 4.09 MIL/uL — ABNORMAL LOW (ref 4.22–5.81)
RDW: 17.2 % — ABNORMAL HIGH (ref 11.5–15.5)
WBC Count: 4.8 K/uL (ref 4.0–10.5)
nRBC: 0 % (ref 0.0–0.2)

## 2024-07-03 LAB — CMP (CANCER CENTER ONLY)
ALT: 14 U/L (ref 0–44)
AST: 18 U/L (ref 15–41)
Albumin: 3.9 g/dL (ref 3.5–5.0)
Alkaline Phosphatase: 119 U/L (ref 38–126)
Anion gap: 11 (ref 5–15)
BUN: 8 mg/dL (ref 6–20)
CO2: 22 mmol/L (ref 22–32)
Calcium: 9.2 mg/dL (ref 8.9–10.3)
Chloride: 106 mmol/L (ref 98–111)
Creatinine: 0.59 mg/dL — ABNORMAL LOW (ref 0.61–1.24)
GFR, Estimated: >60 mL/min (ref >60)
Glucose, Bld: 90 mg/dL (ref 70–99)
Potassium: 3.2 mmol/L — ABNORMAL LOW (ref 3.5–5.1)
Sodium: 139 mmol/L (ref 135–145)
Total Bilirubin: 0.6 mg/dL (ref 0.0–1.2)
Total Protein: 6.5 g/dL (ref 6.5–8.1)

## 2024-07-03 LAB — MAGNESIUM: Magnesium: 1.4 mg/dL — ABNORMAL LOW (ref 1.7–2.4)

## 2024-07-03 LAB — CEA (ACCESS): CEA (CHCC): <1 ng/mL (ref 0.00–5.00)

## 2024-07-03 MED ORDER — SODIUM CHLORIDE 0.9 % IV SOLN
400.0000 mg/m2 | Freq: Once | INTRAVENOUS | Status: AC
  Administered 2024-07-03: 728 mg via INTRAVENOUS
  Filled 2024-07-03: qty 25

## 2024-07-03 MED ORDER — SODIUM CHLORIDE 0.9 % IV SOLN
180.0000 mg/m2 | Freq: Once | INTRAVENOUS | Status: AC
  Administered 2024-07-03: 320 mg via INTRAVENOUS
  Filled 2024-07-03: qty 15

## 2024-07-03 MED ORDER — DEXAMETHASONE SOD PHOSPHATE PF 10 MG/ML IJ SOLN
10.0000 mg | Freq: Once | INTRAMUSCULAR | Status: AC
  Administered 2024-07-03: 10 mg via INTRAVENOUS

## 2024-07-03 MED ORDER — SODIUM CHLORIDE 0.9 % IV SOLN: Freq: Once | INTRAVENOUS | Status: AC

## 2024-07-03 MED ORDER — MAGNESIUM SULFATE 2 GM/50ML IV SOLN
2.0000 g | Freq: Once | INTRAVENOUS | Status: AC
  Administered 2024-07-03: 2 g via INTRAVENOUS
  Filled 2024-07-03: qty 50

## 2024-07-03 MED ORDER — ATROPINE SULFATE 1 MG/ML IV SOLN
0.5000 mg | Freq: Once | INTRAVENOUS | Status: AC | PRN
  Administered 2024-07-03: 0.5 mg via INTRAVENOUS
  Filled 2024-07-03: qty 1

## 2024-07-03 MED ORDER — SODIUM CHLORIDE 0.9 % IV SOLN
2400.0000 mg/m2 | INTRAVENOUS | Status: DC
  Administered 2024-07-03: 4350 mg via INTRAVENOUS
  Filled 2024-07-03: qty 87

## 2024-07-03 MED ORDER — PALONOSETRON HCL INJECTION 0.25 MG/5ML
0.2500 mg | Freq: Once | INTRAVENOUS | Status: AC
  Administered 2024-07-03: 0.25 mg via INTRAVENOUS
  Filled 2024-07-03: qty 5

## 2024-07-03 MED ORDER — SODIUM CHLORIDE 0.9 % IV SOLN
6.0000 mg/kg | Freq: Once | INTRAVENOUS | Status: AC
  Administered 2024-07-03: 400 mg via INTRAVENOUS
  Filled 2024-07-03: qty 20

## 2024-07-03 NOTE — Assessment & Plan Note (Signed)
 stage IIIB p(T3, N1aM0), MSS, KRAS/NRAS/BRAF wild type, lung and node metastasis in 04/2022  -Initially diagnosed in 08/2021 -he completed 4 cycles adjuvant CAPOX 10/10/21 - 12/25/21 (though he somehow was still taking Xeloda  through ~02/09/22 due to misunderstandings).  -He had a metastatic recurrence in the lungs and retroperitoneal adenopathy in September 2023 -he started FOLFIRI on 05/08/2022, Vectibix  added from cycle 2, tolerating well overall -restaging CT scan image from August 02, 2022 and 10/28/2022 both showed good partial response in pulmonary metastasis, no other new lesions. -we have changed his treatment to maintenance therapy with 5-fu and vectibix  in late March 2024 -He is tolerating treatment very well, will continue. -due to metastasis in both lungs and retroperitoneal lymph nodes, he is not a candidate for surgery or local therapy  -restaging CT 10/06/2023 showed stable disease overall except increased size of the pulmonary nodule in the lingula now measuring 7 mm Will continue current therapy. - Restaging CT scan on May 29th 2025 showed mild disease progression in the lung, I changed his chemotherapy back to FOLFIRI and Vectibix  on 01/03/2024 -CT 04/04/2024 showed stable disease, will continue current therapy

## 2024-07-03 NOTE — Progress Notes (Addendum)
 [facility]   [phone] [phone]   Clinic Follow up Note   Patient Care Team: Medicine, [facility] as PCP - General [name], MD as Consulting Physician (General Surgery) [name], MD as Consulting Physician (Oncology)  Date of Service:  07/03/2024  CHIEF COMPLAINT: f/u of metastatic colon cancer  CURRENT THERAPY:  Second line chemotherapy FOLFIRI and Vectibix   Oncology History   Cancer of left colon (HCC) stage IIIB p(T3, N1aM0), MSS, KRAS/NRAS/BRAF wild type, lung and node metastasis in 04/2022  -Initially diagnosed in 08/2021 -he completed 4 cycles adjuvant CAPOX 10/10/21 - 12/25/21 (though he somehow was still taking Xeloda  through ~02/09/22 due to misunderstandings).  -He had a metastatic recurrence in the lungs and retroperitoneal adenopathy in September 2023 -he started FOLFIRI on 05/08/2022, Vectibix  added from cycle 2, tolerating well overall -restaging CT scan image from August 02, 2022 and 10/28/2022 both showed good partial response in pulmonary metastasis, no other new lesions. -we have changed his treatment to maintenance therapy with 5-fu and vectibix  in late March 2024 -He is tolerating treatment very well, will continue. -due to metastasis in both lungs and retroperitoneal lymph nodes, he is not a candidate for surgery or local therapy  -restaging CT 10/06/2023 showed stable disease overall except increased size of the pulmonary nodule in the lingula now measuring 7 mm Will continue current therapy. - Restaging CT scan on May 29th 2025 showed mild disease progression in the lung, I changed his chemotherapy back to FOLFIRI and Vectibix  on 01/03/2024 -CT 04/04/2024 showed stable disease, will continue current therapy   Assessment & Plan Metastatic colon cancer with liver involvement No new symptoms reported. Appetite is good, no pain, neuropathy, or leg swelling.  - Lab reviewed.  CBC results are available, CMP  pending.  If it is adequate, will proceed to chemo today. -Scheduled for restaging CT scan next week. - Will review scan results in two weeks - Will follow up in two weeks to review scan results  Hypokalemia and hypomagnesia  - Secondary to Vectibix , will give 4 g magnesium  IV today - Increase his oral magnesium  and potassium to 3 times a day   SUMMARY OF ONCOLOGIC HISTORY: Oncology History Overview Note   Cancer Staging  Cancer of left colon [facility]) Staging form: Colon and Rectum, AJCC 8th Edition - Pathologic stage from 08/08/2021: Stage IIIB (pT3, pN1a, cM0) - Signed by [name], MD on 08/26/2021    Cancer of left colon (HCC)  04/01/2020 Imaging   IMPRESSION: 1. Gallbladder decompressed bowel also partially calcified gallstones. No pericholecystic inflammation though if there is persisting clinical concern for cholecystitis right upper quadrant ultrasound could be obtained. 2. Circumferential thickening of the distal thoracic esophagus. Could reflect features of esophagitis. Correlate with clinical symptoms and consider endoscopy as clinically warranted. 3. Additional segmental thickening of the mid to distal sigmoid with focal narrowing. No acute surrounding inflammation or resulting obstruction. Findings are nonspecific, and could reflect sequela of prior inflammation/infection. However, recommend correlation with colonoscopy if not recently performed. 4. Mild circumferential bladder wall thickening and indentation of the bladder base by an enlarged prostate. Possibly sequela of chronic outlet obstruction though could correlate with urinalysis to exclude cystitis. 5. Aortic Atherosclerosis (ICD10-I70.0).   08/06/2021 Imaging   IMPRESSION: Sigmoid colonic perforation with small free intraperitoneal gas and infiltration of the mesenteric and omental fat in keeping with changes of peritonitis.   Long segment inflammatory stranding of the sigmoid colon in keeping with  a severe  infectious or inflammatory colitis. This terminates an area of irregular mural thickening, infiltrative soft tissue within the colonic mesentery, and focal dystrophic calcification. This may represent a chronic inflammatory process, however, a perforated malignancy could appear similarly. There are 2 separate points of perforation which again raise the question of an underlying malignancy.   7.1 cm gas and fluid containing pericolonic abscess within the sigmoid mesentery.   Marked inflammatory change of the terminal ileum adjacent to the pericolonic abscess with resultant small bowel obstruction. Fluid within the distal esophagus likely relates to gastroesophageal reflux the setting of vomiting.   Aortic Atherosclerosis (ICD10-I70.0).   08/08/2021 Cancer Staging   Staging form: Colon and Rectum, AJCC 8th Edition - Pathologic stage from 08/08/2021: Stage IIIB (pT3, pN1a, cM0) - Signed by [name], MD on 08/26/2021 Stage prefix: Initial diagnosis Total positive nodes: 1 Histologic grading system: 4 grade system Histologic grade (G): G2 Residual tumor (R): R0 - None   08/08/2021 Definitive Surgery   FINAL MICROSCOPIC DIAGNOSIS:   A. COLON, SIGMOID, PARTIAL COLECTOMY:  - Invasive moderately differentiated adenocarcinoma.  - Metastatic carcinoma involving one of twelve lymph nodes (1/12).  - See oncology table below.   ADDENDUM:  Mismatch Repair Protein (IHC)  SUMMARY INTERPRETATION: NORMAL    08/14/2021 Imaging   EXAM: CT ABDOMEN AND PELVIS WITH CONTRAST  IMPRESSION: 1. Post recent sigmoid colectomy with left lower quadrant colostomy. Two small residual foci of air within the pelvic mesentery with mild adjacent thickening, but no abscess or drainable collection. Trace non organized free fluid and stranding in the pelvis. 2. Short segment of small bowel wall thickening and inflammation in the pelvis involving the distal ileum, likely reactive. 3. Dilated distal esophagus, stomach, and  small bowel, without discrete transition point, favoring postoperative ileus. 4. Small bilateral pleural effusions and compressive atelectasis. 5. Heterogeneous partially enhancing 14 mm lymph node in the retroperitoneum at the aortoiliac bifurcation, not significantly changed from prior exam. Suspected additional lymph nodes in the anterior common iliac space, not significantly changed from prior exam. Recommend attention at follow-up. 6. Additional chronic findings as described.     08/15/2021 Imaging   EXAM: CT CHEST WITH CONTRAST  IMPRESSION: 1. No evidence of thoracic metastasis. 2. Bilateral small layering pleural effusions with passive atelectasis   08/26/2021 Initial Diagnosis   Cancer of left colon (HCC)   10/10/2021 - 12/12/2021 Chemotherapy   Patient is on Treatment Plan : COLORECTAL Xelox (Capeox) q21d     05/28/2022 - 05/28/2022 Chemotherapy   Patient is on Treatment Plan : COLORECTAL FOLFIRI + Bevacizumab q14d     05/28/2022 -  Chemotherapy   Patient is on Treatment Plan : COLORECTAL FOLFIRI + Panitumumab  q14d     07/31/2022 Imaging    IMPRESSION: Decreased bilateral pulmonary metastases.   Stable mild abdominal lymphadenopathy.   No new or progressive metastatic disease within the chest, abdomen, or pelvis.   01/18/2023 Imaging    IMPRESSION: 1. Status post Hetty pouch sigmoid colon resection with left lower quadrant end colostomy and rectal stump. Unchanged, mild wall thickening of the decompressed distal colon at the ileostomy, as well as the rectal stump. 2. Unchanged small, treated metastatic pulmonary nodules. No new nodules. 3. Unchanged enlarged, coarsely calcified treated lower aortocaval metastatic lymph nodes. No new lymphadenopathy. 4. Coronary artery disease. 5. Cholelithiasis.   04/23/2023 Imaging   CT chest, abdomen, and pelvis with contrast  IMPRESSION: 1. Left lower lobe segmental pulmonary emboli, incidental finding. No evidence  for  right heart strain. 2. Stable trace bilateral pleural effusions. 3. Stable enlarged lower left paraesophageal lymph node. 4. Stable calcified retroperitoneal lymph nodes. No new enlarged lymph nodes in the chest, abdomen or pelvis. 5. Stable subcentimeter soft tissue nodules within the mesenteric fat along the inferior ostomy. 6. Left Bosniak I benign renal cyst measuring 4.6 cm. No follow-up imaging is recommended. JACR 2018 Feb; 264-273, Management of the Incidental Renal Mass on CT, [employer] 2021; 814-848, Bosniak Classification of Cystic Renal Masses, Version 2019.   07/15/2023 Imaging   CT chest abdomen and pelvis with contrast IMPRESSION: 1. Prior partial left hemicolectomy with end colostomy in the left anterior abdominal wall. Unchanged mild wall thickening of the decompressed distal colon at the ileostomy as well as the rectal stump/Hartmann's pouch. 2. Stable enlarged left lower paraesophageal lymph node. 3. Increased size of the treated pulmonary nodule in the lingula now measuring 6 mm previously 4 mm with increased in size favored artifactual given motion degrading examination of the lung bases. Suggest attention on follow-up imaging 4. Unchanged size of the treated pulmonary nodule in the right middle lobe measuring 4 mm. 5. Increased size of prominent bilateral axillary lymph nodes measuring up to 7 mm in short axis, nonspecific. Suggest attention on follow-up imaging 6. No convincing evidence of new or progressive disease in the chest, abdomen or pelvis. 7.  Aortic Atherosclerosis (ICD10-I70.0).     10/06/2023 Imaging   CT chest abdomen pelvis with contrast IMPRESSION: 1. Increased size of the pulmonary nodule in the lingula now measuring 7 mm, concerning for pulmonary metastatic disease recurrence. 2. Additional bilateral pulmonary nodules are stable. 3. Stable prominent bilateral axillary, low paraesophageal and retroperitoneal lymph nodes. 4. Prior partial left  hemicolectomy with Hartmann's pouch formation and left anterior abdominal wall colostomy. No suspicious nodularity along the Hartmann's suture line. 5. Patulous esophagus with symmetric distal esophageal wall thickening, similar prior. 6. Trace right pleural effusion. 7.  Aortic Atherosclerosis (ICD10-I70.0).     12/30/2023 Imaging   CT chest, abdomen, and pelvis with contrast IMPRESSION: 1. Increased size of bilateral pulmonary nodules with new 4 mm left upper lobe and 5 mm superior segment right lower lobe pulmonary nodules, compatible with worsening pulmonary metastatic disease. 2. Stable prominent bilateral axillary and mediastinal lymph nodes. 3. Stable coarsely calcified retroperitoneal lymph nodes measuring up to 10 mm in short axis. No new pathologically enlarged abdominal or pelvic lymph nodes. 4. Stable trace right pleural effusion. 5. Prior partial left hemicolectomy with Hartmann's pouch formation and left anterior abdominal wall colostomy. No new suspicious nodularity along the suture line.      Discussed the use of AI scribe software for clinical note transcription with the patient, who gave verbal consent to proceed.  History of Present Illness Matthew [name] is a 58 year old male with metastatic colon cancer who presents for follow-up.  He has metastatic colon cancer with no new symptoms since the last visit. He has no pain, neuropathy, numbness, tingling, leg swelling, or breathing or chest issues. Appetite is stable. He notes a runny nose.     All other systems were reviewed with the patient and are negative.  MEDICAL HISTORY:  Past Medical History:  Diagnosis Date   Cancer (HCC)    Schizophrenia [facility]     SURGICAL HISTORY: Past Surgical History:  Procedure Laterality Date   BRONCHIAL BIOPSY  05/11/2022   Procedure: BRONCHIAL BIOPSIES;  Surgeon: Shelah Lamar RAMAN, MD;  Location: [facility] ENDOSCOPY;  Service: Pulmonary;;   BRONCHIAL BRUSHINGS  05/11/2022   Procedure:  BRONCHIAL BRUSHINGS;  Surgeon: Shelah Lamar RAMAN, MD;  Location: [facility] ENDOSCOPY;  Service: Pulmonary;;   BRONCHIAL NEEDLE ASPIRATION BIOPSY  05/11/2022   Procedure: BRONCHIAL NEEDLE ASPIRATION BIOPSIES;  Surgeon: Shelah Lamar RAMAN, MD;  Location: [facility] ENDOSCOPY;  Service: Pulmonary;;   BRONCHIAL WASHINGS  05/11/2022   Procedure: BRONCHIAL WASHINGS;  Surgeon: Shelah Lamar RAMAN, MD;  Location: [facility] ENDOSCOPY;  Service: Pulmonary;;   IR IMAGING GUIDED PORT INSERTION  05/25/2022   LAPAROTOMY N/A 08/08/2021   Procedure: EXPLORATORY LAPAROTOMY;  Surgeon: [name], MD;  Location: [facility] OR;  Service: General;  Laterality: N/A;   PARTIAL COLECTOMY N/A 08/08/2021   Procedure: PARTIAL COLECTOMY WITH COLOSTOMY;  Surgeon: [name], MD;  Location: [facility] OR;  Service: General;  Laterality: N/A;   VIDEO BRONCHOSCOPY WITH RADIAL ENDOBRONCHIAL ULTRASOUND  05/11/2022   Procedure: VIDEO BRONCHOSCOPY WITH RADIAL ENDOBRONCHIAL ULTRASOUND;  Surgeon: Shelah Lamar RAMAN, MD;  Location: [facility] ENDOSCOPY;  Service: Pulmonary;;    I have reviewed the social history and family history with the patient and they are unchanged from previous note.  ALLERGIES:  has no known allergies.  MEDICATIONS:  Current Outpatient Medications  Medication Sig Dispense Refill   acetaminophen  (TYLENOL ) 500 MG tablet Take 2 tablets (1,000 mg total) by mouth every 6 (six) hours as needed for mild pain or moderate pain.  0   apixaban  (ELIQUIS ) 5 MG TABS tablet Take 1 tablet (5 mg total) by mouth 2 (two) times daily. 60 tablet 2   aspirin EC 81 MG tablet Take 81 mg by mouth daily as needed (for pain or headaches).      benztropine  (COGENTIN ) 1 MG tablet Take 1 mg at bedtime by mouth.     clindamycin  (CLINDAGEL) 1 % gel Apply topically 2 (two) times daily. To skin rash on face and upper body 30 g 0   ferrous sulfate  325 (65 FE) MG EC tablet Take 1 tablet (325 mg total) by mouth daily. 30 tablet 3   hydrocortisone  cream 1 % Apply 1 Application topically 2  (two) times daily as needed for itching. For rash 30 g 2   loperamide  (IMODIUM ) 2 MG capsule Take 1 capsule (2 mg total) by mouth as needed for diarrhea or loose stools. 30 capsule 0   magnesium  oxide (MAG-OX) 400 (240 Mg) MG tablet Take 1 tablet (400 mg total) by mouth 2 (two) times daily. 30 tablet 2   Multiple Vitamin (MULTIVITAMIN WITH MINERALS) TABS tablet Take 1 tablet by mouth daily.     ondansetron  (ZOFRAN ) 8 MG tablet Take 1 tablet (8 mg total) by mouth every 8 (eight) hours as needed for nausea or vomiting. 30 tablet 0   paliperidone  (INVEGA  SUSTENNA) 156 MG/ML SUSP injection Inject 156 mg every 30 (thirty) days into the muscle.     polyethylene glycol (MIRALAX  / GLYCOLAX ) 17 g packet Take 17 g by mouth daily as needed for mild constipation or moderate constipation.  0   potassium chloride  SA (KLOR-CON  M) 20 MEQ tablet Take 1 tablet (20 mEq total) by mouth 2 (two) times daily. 60 tablet 2   prochlorperazine  (COMPAZINE ) 10 MG tablet TAKE 1 TABLET(10 MG) BY MOUTH EVERY 6 HOURS AS NEEDED FOR NAUSEA OR VOMITING 30 tablet 0   No current facility-administered medications for this visit.   Facility-Administered Medications Ordered in Other Visits  Medication Dose Route Frequency Provider Last Rate Last Admin   fluorouracil  (ADRUCIL ) 4,350 mg in sodium chloride  0.9 % 63 mL  chemo infusion  2,400 mg/m2 (Treatment Plan Recorded) Intravenous 1 day or 1 dose [name], MD   Infusion Verify at 07/03/24 1554    PHYSICAL EXAMINATION: ECOG PERFORMANCE STATUS: 1 - Symptomatic but completely ambulatory  There were no vitals filed for this visit. Wt Readings from Last 3 Encounters:  07/03/24 143 lb 8 oz (65.1 kg)  06/19/24 141 lb 2 oz (64 kg)  06/19/24 141 lb 3.2 oz (64 kg)     GENERAL:alert, no distress and comfortable SKIN: skin color, texture, turgor are normal, no rashes or significant lesions EYES: normal, Conjunctiva are pink and non-injected, sclera clear NECK: supple, thyroid normal size,  non-tender, without nodularity LYMPH:  no palpable lymphadenopathy in the cervical, axillary  LUNGS: clear to auscultation and percussion with normal breathing effort HEART: regular rate & rhythm and no murmurs and no lower extremity edema ABDOMEN:abdomen soft, non-tender and normal bowel sounds Musculoskeletal:no cyanosis of digits and no clubbing    Physical Exam    LABORATORY DATA:  I have reviewed the data as listed    Latest Ref Rng & Units 07/03/2024   10:40 AM 06/19/2024   10:18 AM 06/05/2024   10:07 AM  CBC  WBC 4.0 - 10.5 K/uL 4.8  3.8  6.4   Hemoglobin 13.0 - 17.0 g/dL 89.1  88.4  89.1   Hematocrit 39.0 - 52.0 % 33.0  35.1  32.3   Platelets 150 - 400 K/uL 355  369  344         Latest Ref Rng & Units 07/03/2024   10:40 AM 06/19/2024   10:18 AM 06/05/2024   10:07 AM  CMP  Glucose 70 - 99 mg/dL 90  99  868   BUN 6 - 20 mg/dL 8  9  12    Creatinine 0.61 - 1.24 mg/dL 9.40  9.36  9.28   Sodium 135 - 145 mmol/L 139  139  137   Potassium 3.5 - 5.1 mmol/L 3.2  3.5  3.1   Chloride 98 - 111 mmol/L 106  108  107   CO2 22 - 32 mmol/L 22  24  24    Calcium  8.9 - 10.3 mg/dL 9.2  9.3  8.8   Total Protein 6.5 - 8.1 g/dL 6.5  6.4  6.2   Total Bilirubin 0.0 - 1.2 mg/dL 0.6  0.7  0.6   Alkaline Phos 38 - 126 U/L 119  111  112   AST 15 - 41 U/L 18  13  13    ALT 0 - 44 U/L 14  14  11        RADIOGRAPHIC STUDIES: I have personally reviewed the radiological images as listed and agreed with the findings in the report. No results found.    No orders of the defined types were placed in this encounter.  All questions were answered. The patient knows to call the clinic with any problems, questions or concerns. No barriers to learning was detected. The total time spent in the appointment was 25 minutes, including review of chart and various tests results, discussions about plan of care and coordination of care plan     Callander Lanny, MD 07/03/2024

## 2024-07-03 NOTE — Patient Instructions (Signed)
 [facility] - A DEPT OF [facility]  Discharge Instructions: Thank you for choosing [facility] to provide your oncology and hematology care.   If you have a lab appointment with the Cancer Center, please go directly to the Cancer Center and check in at the registration area.   Wear comfortable clothing and clothing appropriate for easy access to any Portacath or PICC line.   We strive to give you quality time with your provider. You may need to reschedule your appointment if you arrive late (15 or more minutes).  Arriving late affects you and other patients whose appointments are after yours.  Also, if you miss three or more appointments without notifying the office, you may be dismissed from the clinic at the provider's discretion.      For prescription refill requests, have your pharmacy contact our office and allow 72 hours for refills to be completed.    Today you received the following chemotherapy and/or immunotherapy agents: panitumumab , irinotecan , leucovorin , fluorouracil       To help prevent nausea and vomiting after your treatment, we encourage you to take your nausea medication as directed.  BELOW ARE SYMPTOMS THAT SHOULD BE REPORTED IMMEDIATELY: *FEVER GREATER THAN 100.4 F (38 C) OR HIGHER *CHILLS OR SWEATING *NAUSEA AND VOMITING THAT IS NOT CONTROLLED WITH YOUR NAUSEA MEDICATION *UNUSUAL SHORTNESS OF BREATH *UNUSUAL BRUISING OR BLEEDING *URINARY PROBLEMS (pain or burning when urinating, or frequent urination) *BOWEL PROBLEMS (unusual diarrhea, constipation, pain near the anus) TENDERNESS IN MOUTH AND THROAT WITH OR WITHOUT PRESENCE OF ULCERS (sore throat, sores in mouth, or a toothache) UNUSUAL RASH, SWELLING OR PAIN  UNUSUAL VAGINAL DISCHARGE OR ITCHING   Items with * indicate a potential emergency and should be followed up as soon as possible or go to the Emergency Department if any problems should occur.  Please show the  CHEMOTHERAPY ALERT CARD or IMMUNOTHERAPY ALERT CARD at check-in to the Emergency Department and triage nurse.  Should you have questions after your visit or need to cancel or reschedule your appointment, please contact [facility] - A DEPT OF [facility]  Dept: [phone]  and follow the prompts.  Office hours are 8:00 a.m. to 4:30 p.m. Monday - Friday. Please note that voicemails left after 4:00 p.m. may not be returned until the following business day.  We are closed weekends and major holidays. You have access to a nurse at all times for urgent questions. Please call the main number to the clinic Dept: [phone] and follow the prompts.   For any non-urgent questions, you may also contact your provider using MyChart. We now offer e-Visits for anyone 81 and older to request care online for non-urgent symptoms. For details visit [email or web address].   Also download the MyChart app! Go to the app store, search MyChart, open the app, select [facility], and log in with your MyChart username and password.

## 2024-07-03 NOTE — Progress Notes (Signed)
 Due to late arrival, not enough time to give 4g of magnesium  today. Will give magnesium  2g today and an additional 2g with pump d/c.  Harlene Nasuti, PharmD Oncology Infusion Pharmacist 07/03/2024 12:08 PM

## 2024-07-03 NOTE — Telephone Encounter (Signed)
 Patient did not arrive to his scheduled appointments. Attempted to contact the patient and his mother.  Unable to reach anyone at [phone] x [phone] unsuccessful attempts.  LVM to contact facility to reschedule.

## 2024-07-05 ENCOUNTER — Inpatient Hospital Stay: Payer: MEDICAID

## 2024-07-05 VITALS — BP 98/63 | HR 83 | Temp 97.7°F | Resp 18

## 2024-07-05 DIAGNOSIS — Z5111 Encounter for antineoplastic chemotherapy: Secondary | ICD-10-CM | POA: Diagnosis not present

## 2024-07-05 DIAGNOSIS — C186 Malignant neoplasm of descending colon: Secondary | ICD-10-CM

## 2024-07-05 MED ORDER — SODIUM CHLORIDE 0.9 % IV SOLN: INTRAVENOUS | Status: DC

## 2024-07-05 MED ORDER — SODIUM CHLORIDE 0.9% FLUSH: 10.0000 mL | INTRAVENOUS | Status: DC | PRN

## 2024-07-05 MED ORDER — MAGNESIUM SULFATE 2 GM/50ML IV SOLN
2.0000 g | Freq: Once | INTRAVENOUS | Status: AC
  Administered 2024-07-05: 2 g via INTRAVENOUS
  Filled 2024-07-05: qty 50

## 2024-07-05 NOTE — Patient Instructions (Signed)
 Magnesium Sulfate Injection What is this medication? MAGNESIUM SULFATE (mag NEE zee um SUL fate) prevents and treats low levels of magnesium in your body. It may also be used to prevent and treat seizures during pregnancy in people with high blood pressure disorders, such as preeclampsia or eclampsia. Magnesium plays an important role in maintaining the health of your muscles and nervous system. This medicine may be used for other purposes; ask your health care provider or pharmacist if you have questions. What should I tell my care team before I take this medication? They need to know if you have any of these conditions: Heart disease History of irregular heart beat Kidney disease An unusual or allergic reaction to magnesium sulfate, medications, foods, dyes, or preservatives Pregnant or trying to get pregnant Breast-feeding How should I use this medication? This medication is for infusion into a vein. It is given in a hospital or clinic setting. Talk to your care team about the use of this medication in children. While this medication may be prescribed for selected conditions, precautions do apply. Overdosage: If you think you have taken too much of this medicine contact a poison control center or emergency room at once. NOTE: This medicine is only for you. Do not share this medicine with others. What if I miss a dose? This does not apply. What may interact with this medication? Certain medications for anxiety or sleep Certain medications for seizures, such phenobarbital Digoxin Medications that relax muscles for surgery Narcotic medications for pain This list may not describe all possible interactions. Give your health care provider a list of all the medicines, herbs, non-prescription drugs, or dietary supplements you use. Also tell them if you smoke, drink alcohol, or use illegal drugs. Some items may interact with your medicine. What should I watch for while using this  medication? Your condition will be monitored carefully while you are receiving this medication. You may need blood work done while you are receiving this medication. What side effects may I notice from receiving this medication? Side effects that you should report to your care team as soon as possible: Allergic reactions--skin rash, itching, hives, swelling of the face, lips, tongue, or throat High magnesium level--confusion, drowsiness, facial flushing, redness, sweating, muscle weakness, fast or irregular heartbeat, trouble breathing Low blood pressure--dizziness, feeling faint or lightheaded, blurry vision Side effects that usually do not require medical attention (report to your care team if they continue or are bothersome): Headache Nausea This list may not describe all possible side effects. Call your doctor for medical advice about side effects. You may report side effects to FDA at 1-800-FDA-1088. Where should I keep my medication? This medication is given in a hospital or clinic and will not be stored at home. NOTE: This sheet is a summary. It may not cover all possible information. If you have questions about this medicine, talk to your doctor, pharmacist, or health care provider.  2024 Elsevier/Gold Standard (2021-04-02 00:00:00)

## 2024-07-13 ENCOUNTER — Ambulatory Visit (HOSPITAL_COMMUNITY)
Admission: RE | Admit: 2024-07-13 | Discharge: 2024-07-13 | Disposition: A | Discharge status: Home / Self Care | Payer: MEDICAID | Source: Ambulatory Visit | Attending: Hematology | Admitting: Hematology

## 2024-07-13 DIAGNOSIS — C186 Malignant neoplasm of descending colon: Secondary | ICD-10-CM | POA: Insufficient documentation

## 2024-07-13 MED ORDER — SODIUM CHLORIDE (PF) 0.9 % IJ SOLN
INTRAMUSCULAR | Status: AC
  Filled 2024-07-13: qty 50

## 2024-07-13 MED ORDER — IOHEXOL 300 MG/ML  SOLN
100.0000 mL | Freq: Once | INTRAMUSCULAR | Status: AC | PRN
  Administered 2024-07-13: 100 mL via INTRAVENOUS

## 2024-07-13 MED ORDER — IOHEXOL 9 MG/ML PO SOLN
500.0000 mL | ORAL | Status: AC
  Administered 2024-07-13 (×2): 500 mL via ORAL

## 2024-07-16 NOTE — Assessment & Plan Note (Signed)
 stage IIIB p(T3, N1aM0), MSS, KRAS/NRAS/BRAF wild type, lung and node metastasis in 04/2022  -Initially diagnosed in 08/2021 -he completed 4 cycles adjuvant CAPOX 10/10/21 - 12/25/21 (though he somehow was still taking Xeloda  through ~02/09/22 due to misunderstandings).  -He had a metastatic recurrence in the lungs and retroperitoneal adenopathy in September 2023 -he started FOLFIRI on 05/08/2022, Vectibix  added from cycle 2, tolerating well overall -restaging CT scan image from August 02, 2022 and 10/28/2022 both showed good partial response in pulmonary metastasis, no other new lesions. -we have changed his treatment to maintenance therapy with 5-fu and vectibix  in late March 2024 -He is tolerating treatment very well, will continue. -due to metastasis in both lungs and retroperitoneal lymph nodes, he is not a candidate for surgery or local therapy  -restaging CT 10/06/2023 showed stable disease overall except increased size of the pulmonary nodule in the lingula now measuring 7 mm Will continue current therapy. - Restaging CT scan on May 29th 2025 showed mild disease progression in the lung, I changed his chemotherapy back to FOLFIRI and Vectibix  on 01/03/2024 -CT 04/04/2024 showed stable disease, will continue current therapy

## 2024-07-17 ENCOUNTER — Inpatient Hospital Stay: Payer: MEDICAID | Admitting: Hematology

## 2024-07-17 ENCOUNTER — Inpatient Hospital Stay: Payer: MEDICAID

## 2024-07-17 VITALS — BP 95/65 | HR 92 | Temp 97.7°F | Resp 17 | Wt 146.6 lb

## 2024-07-17 DIAGNOSIS — C186 Malignant neoplasm of descending colon: Secondary | ICD-10-CM | POA: Diagnosis present

## 2024-07-17 DIAGNOSIS — Z5111 Encounter for antineoplastic chemotherapy: Secondary | ICD-10-CM | POA: Diagnosis not present

## 2024-07-17 LAB — CBC WITH DIFFERENTIAL (CANCER CENTER ONLY)
Abs Immature Granulocytes: 0.01 K/uL (ref 0.00–0.07)
Basophils Absolute: 0 K/uL (ref 0.0–0.1)
Basophils Relative: 1 %
Eosinophils Absolute: 0.1 K/uL (ref 0.0–0.5)
Eosinophils Relative: 3 %
HCT: 32.7 % — ABNORMAL LOW (ref 39.0–52.0)
Hemoglobin: 10.5 g/dL — ABNORMAL LOW (ref 13.0–17.0)
Immature Granulocytes: 0 %
Lymphocytes Relative: 18 %
Lymphs Abs: 0.7 K/uL (ref 0.7–4.0)
MCH: 26.1 pg (ref 26.0–34.0)
MCHC: 32.1 g/dL (ref 30.0–36.0)
MCV: 81.1 fL (ref 80.0–100.0)
Monocytes Absolute: 0.4 K/uL (ref 0.1–1.0)
Monocytes Relative: 10 %
Neutro Abs: 2.7 K/uL (ref 1.7–7.7)
Neutrophils Relative %: 68 %
Platelet Count: 345 K/uL (ref 150–400)
RBC: 4.03 MIL/uL — ABNORMAL LOW (ref 4.22–5.81)
RDW: 17.5 % — ABNORMAL HIGH (ref 11.5–15.5)
WBC Count: 4 K/uL (ref 4.0–10.5)
nRBC: 0 % (ref 0.0–0.2)

## 2024-07-17 LAB — CMP (CANCER CENTER ONLY)
ALT: 12 U/L (ref 0–44)
AST: 18 U/L (ref 15–41)
Albumin: 3.8 g/dL (ref 3.5–5.0)
Alkaline Phosphatase: 113 U/L (ref 38–126)
Anion gap: 10 (ref 5–15)
BUN: 16 mg/dL (ref 6–20)
CO2: 22 mmol/L (ref 22–32)
Calcium: 9.1 mg/dL (ref 8.9–10.3)
Chloride: 107 mmol/L (ref 98–111)
Creatinine: 0.61 mg/dL (ref 0.61–1.24)
GFR, Estimated: >60 mL/min (ref >60)
Glucose, Bld: 95 mg/dL (ref 70–99)
Potassium: 3.7 mmol/L (ref 3.5–5.1)
Sodium: 138 mmol/L (ref 135–145)
Total Bilirubin: 0.4 mg/dL (ref 0.0–1.2)
Total Protein: 6.3 g/dL — ABNORMAL LOW (ref 6.5–8.1)

## 2024-07-17 LAB — MAGNESIUM: Magnesium: 1.4 mg/dL — ABNORMAL LOW (ref 1.7–2.4)

## 2024-07-17 MED ORDER — SODIUM CHLORIDE 0.9 % IV SOLN
180.0000 mg/m2 | Freq: Once | INTRAVENOUS | Status: AC
  Administered 2024-07-17: 14:00:00 320 mg via INTRAVENOUS
  Filled 2024-07-17: qty 15

## 2024-07-17 MED ORDER — SODIUM CHLORIDE 0.9 % IV SOLN: Freq: Once | INTRAVENOUS | Status: AC

## 2024-07-17 MED ORDER — ATROPINE SULFATE 1 MG/ML IV SOLN
0.5000 mg | Freq: Once | INTRAVENOUS | Status: AC | PRN
  Administered 2024-07-17: 14:00:00 0.5 mg via INTRAVENOUS
  Filled 2024-07-17: qty 1

## 2024-07-17 MED ORDER — DEXAMETHASONE SOD PHOSPHATE PF 10 MG/ML IJ SOLN
10.0000 mg | Freq: Once | INTRAMUSCULAR | Status: AC
  Administered 2024-07-17: 11:00:00 10 mg via INTRAVENOUS

## 2024-07-17 MED ORDER — PALONOSETRON HCL INJECTION 0.25 MG/5ML
0.2500 mg | Freq: Once | INTRAVENOUS | Status: AC
  Administered 2024-07-17: 11:00:00 0.25 mg via INTRAVENOUS
  Filled 2024-07-17: qty 5

## 2024-07-17 MED ORDER — SODIUM CHLORIDE 0.9 % IV SOLN
400.0000 mg/m2 | Freq: Once | INTRAVENOUS | Status: AC
  Administered 2024-07-17: 14:00:00 728 mg via INTRAVENOUS
  Filled 2024-07-17: qty 25

## 2024-07-17 MED ORDER — MAGNESIUM SULFATE 4 GM/100ML IV SOLN
4.0000 g | Freq: Once | INTRAVENOUS | Status: AC
  Administered 2024-07-17: 11:00:00 4 g via INTRAVENOUS
  Filled 2024-07-17: qty 100

## 2024-07-17 MED ORDER — SODIUM CHLORIDE 0.9 % IV SOLN
6.0000 mg/kg | Freq: Once | INTRAVENOUS | Status: AC
  Administered 2024-07-17: 13:00:00 400 mg via INTRAVENOUS
  Filled 2024-07-17: qty 20

## 2024-07-17 MED ORDER — SODIUM CHLORIDE 0.9 % IV SOLN
2400.0000 mg/m2 | INTRAVENOUS | Status: DC
  Administered 2024-07-17: 16:00:00 4350 mg via INTRAVENOUS
  Filled 2024-07-17: qty 87

## 2024-07-17 NOTE — Progress Notes (Signed)
 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  -He had a metastatic recurrence in the lungs and retroperitoneal adenopathy in September 2023 -he started FOLFIRI on 05/08/2022, Vectibix  added from cycle 2, tolerating well overall -restaging CT scan image from August 02, 2022 and 10/28/2022 both showed good partial response in pulmonary metastasis, no other new lesions. -we have changed his treatment to maintenance therapy with 5-fu and vectibix  in late March 2024 -He is tolerating treatment very well, will continue. -due to metastasis in both lungs and retroperitoneal lymph nodes, he is not a candidate for surgery or local therapy  -restaging CT 10/06/2023 showed stable disease overall except increased size of the pulmonary nodule in the lingula now measuring 7 mm Will continue current therapy. - Restaging CT scan on May 29th 2025 showed mild disease progression in the lung, I changed his chemotherapy back to FOLFIRI and Vectibix  on 01/03/2024 -CT 04/04/2024 showed stable disease, will continue current therapy   Assessment & Plan Metastatic colon cancer to the lung Disease remains stable on current chemotherapy regimen, with no evidence of progression on recent CT scan. A very small pulmonary lesion persists, possibly  representing residual malignancy or scar tissue. He is tolerating FOLFIRI and Vectibix  without significant adverse effects and is adherent to supportive medications. Overall, he is responding well to treatment. - Continued current chemotherapy regimen (FOLFIRI and Vectibix ) every two weeks. - Reinforced adherence to magnesium  and potassium supplementation after each meal. - Scheduled next chemotherapy sessions for January 5 and January 19 to accommodate holiday preference. - Instructed to schedule future appointments at checkout.  Pleural effusion Very small right-sided pleural effusion on CT scan, stable and asymptomatic.  Plan - I personally reviewed his restaging CT scan images, and discussed the finding with patient and his mother - Lab reviewed, adequate for treatment, will proceed with treatment today - Will postpone next cycle chemo for a week due to holiday -f/u in 3 weeks    SUMMARY OF ONCOLOGIC HISTORY: Oncology History Overview Note   Cancer Staging  Cancer of left colon [facility]) Staging form: Colon and Rectum, AJCC 8th Edition - Pathologic stage from 08/08/2021: Stage IIIB (pT3, pN1a, cM0) - Signed by [name], MD on 08/26/2021    Cancer of left colon (HCC)  04/01/2020 Imaging   IMPRESSION: 1. Gallbladder decompressed bowel also partially calcified gallstones. No pericholecystic inflammation though if there is persisting clinical concern for cholecystitis right upper quadrant ultrasound could be obtained. 2. Circumferential thickening of the distal thoracic esophagus. Could reflect features of esophagitis. Correlate with clinical symptoms and consider endoscopy as clinically warranted. 3. Additional segmental thickening of the mid to distal sigmoid with focal narrowing. No acute surrounding inflammation or resulting obstruction. Findings are nonspecific, and could reflect sequela of prior inflammation/infection. However, recommend correlation with colonoscopy if not  recently performed. 4. Mild circumferential bladder  wall thickening and indentation of the bladder base by an enlarged prostate. Possibly sequela of chronic outlet obstruction though could correlate with urinalysis to exclude cystitis. 5. Aortic Atherosclerosis (ICD10-I70.0).   08/06/2021 Imaging   IMPRESSION: Sigmoid colonic perforation with small free intraperitoneal gas and infiltration of the mesenteric and omental fat in keeping with changes of peritonitis.   Long segment inflammatory stranding of the sigmoid colon in keeping with a severe infectious or inflammatory colitis. This terminates an area of irregular mural thickening, infiltrative soft tissue within the colonic mesentery, and focal dystrophic calcification. This may represent a chronic inflammatory process, however, a perforated malignancy could appear similarly. There are 2 separate points of perforation which again raise the question of an underlying malignancy.   7.1 cm gas and fluid containing pericolonic abscess within the sigmoid mesentery.   Marked inflammatory change of the terminal ileum adjacent to the pericolonic abscess with resultant small bowel obstruction. Fluid within the distal esophagus likely relates to gastroesophageal reflux the setting of vomiting.   Aortic Atherosclerosis (ICD10-I70.0).   08/08/2021 Cancer Staging   Staging form: Colon and Rectum, AJCC 8th Edition - Pathologic stage from 08/08/2021: Stage IIIB (pT3, pN1a, cM0) - Signed by [name], MD on 08/26/2021 Stage prefix: Initial diagnosis Total positive nodes: 1 Histologic grading system: 4 grade system Histologic grade (G): G2 Residual tumor (R): R0 - None   08/08/2021 Definitive Surgery   FINAL MICROSCOPIC DIAGNOSIS:   A. COLON, SIGMOID, PARTIAL COLECTOMY:  - Invasive moderately differentiated adenocarcinoma.  - Metastatic carcinoma involving one of twelve lymph nodes (1/12).  - See oncology table below.   ADDENDUM:  Mismatch Repair  Protein (IHC)  SUMMARY INTERPRETATION: NORMAL    08/14/2021 Imaging   EXAM: CT ABDOMEN AND PELVIS WITH CONTRAST  IMPRESSION: 1. Post recent sigmoid colectomy with left lower quadrant colostomy. Two small residual foci of air within the pelvic mesentery with mild adjacent thickening, but no abscess or drainable collection. Trace non organized free fluid and stranding in the pelvis. 2. Short segment of small bowel wall thickening and inflammation in the pelvis involving the distal ileum, likely reactive. 3. Dilated distal esophagus, stomach, and small bowel, without discrete transition point, favoring postoperative ileus. 4. Small bilateral pleural effusions and compressive atelectasis. 5. Heterogeneous partially enhancing 14 mm lymph node in the retroperitoneum at the aortoiliac bifurcation, not significantly changed from prior exam. Suspected additional lymph nodes in the anterior common iliac space, not significantly changed from prior exam. Recommend attention at follow-up. 6. Additional chronic findings as described.     08/15/2021 Imaging   EXAM: CT CHEST WITH CONTRAST  IMPRESSION: 1. No evidence of thoracic metastasis. 2. Bilateral small layering pleural effusions with passive atelectasis   08/26/2021 Initial Diagnosis   Cancer of left colon (HCC)   10/10/2021 - 12/12/2021 Chemotherapy   Patient is on Treatment Plan : COLORECTAL Xelox (Capeox) q21d     05/28/2022 - 05/28/2022 Chemotherapy   Patient is on Treatment Plan : COLORECTAL FOLFIRI + Bevacizumab q14d     05/28/2022 -  Chemotherapy   Patient is on Treatment Plan : COLORECTAL FOLFIRI + Panitumumab  q14d     07/31/2022 Imaging    IMPRESSION: Decreased bilateral pulmonary metastases.   Stable mild abdominal lymphadenopathy.   No new or progressive metastatic disease within the chest, abdomen, or pelvis.   01/18/2023 Imaging    IMPRESSION: 1. Status post Hetty pouch sigmoid colon resection with left lower  quadrant end colostomy and rectal stump. Unchanged, mild wall thickening  of the decompressed distal colon at the ileostomy, as well as the rectal stump. 2. Unchanged small, treated metastatic pulmonary nodules. No new nodules. 3. Unchanged enlarged, coarsely calcified treated lower aortocaval metastatic lymph nodes. No new lymphadenopathy. 4. Coronary artery disease. 5. Cholelithiasis.   04/23/2023 Imaging   CT chest, abdomen, and pelvis with contrast  IMPRESSION: 1. Left lower lobe segmental pulmonary emboli, incidental finding. No evidence for right heart strain. 2. Stable trace bilateral pleural effusions. 3. Stable enlarged lower left paraesophageal lymph node. 4. Stable calcified retroperitoneal lymph nodes. No new enlarged lymph nodes in the chest, abdomen or pelvis. 5. Stable subcentimeter soft tissue nodules within the mesenteric fat along the inferior ostomy. 6. Left Bosniak I benign renal cyst measuring 4.6 cm. No follow-up imaging is recommended. JACR 2018 Feb; 264-273, Management of the Incidental Renal Mass on CT, [employer] 2021; 814-848, Bosniak Classification of Cystic Renal Masses, Version 2019.   07/15/2023 Imaging   CT chest abdomen and pelvis with contrast IMPRESSION: 1. Prior partial left hemicolectomy with end colostomy in the left anterior abdominal wall. Unchanged mild wall thickening of the decompressed distal colon at the ileostomy as well as the rectal stump/Hartmann's pouch. 2. Stable enlarged left lower paraesophageal lymph node. 3. Increased size of the treated pulmonary nodule in the lingula now measuring 6 mm previously 4 mm with increased in size favored artifactual given motion degrading examination of the lung bases. Suggest attention on follow-up imaging 4. Unchanged size of the treated pulmonary nodule in the right middle lobe measuring 4 mm. 5. Increased size of prominent bilateral axillary lymph nodes measuring up to 7 mm in short axis, nonspecific.  Suggest attention on follow-up imaging 6. No convincing evidence of new or progressive disease in the chest, abdomen or pelvis. 7.  Aortic Atherosclerosis (ICD10-I70.0).     10/06/2023 Imaging   CT chest abdomen pelvis with contrast IMPRESSION: 1. Increased size of the pulmonary nodule in the lingula now measuring 7 mm, concerning for pulmonary metastatic disease recurrence. 2. Additional bilateral pulmonary nodules are stable. 3. Stable prominent bilateral axillary, low paraesophageal and retroperitoneal lymph nodes. 4. Prior partial left hemicolectomy with Hartmann's pouch formation and left anterior abdominal wall colostomy. No suspicious nodularity along the Hartmann's suture line. 5. Patulous esophagus with symmetric distal esophageal wall thickening, similar prior. 6. Trace right pleural effusion. 7.  Aortic Atherosclerosis (ICD10-I70.0).     12/30/2023 Imaging   CT chest, abdomen, and pelvis with contrast IMPRESSION: 1. Increased size of bilateral pulmonary nodules with new 4 mm left upper lobe and 5 mm superior segment right lower lobe pulmonary nodules, compatible with worsening pulmonary metastatic disease. 2. Stable prominent bilateral axillary and mediastinal lymph nodes. 3. Stable coarsely calcified retroperitoneal lymph nodes measuring up to 10 mm in short axis. No new pathologically enlarged abdominal or pelvic lymph nodes. 4. Stable trace right pleural effusion. 5. Prior partial left hemicolectomy with Hartmann's pouch formation and left anterior abdominal wall colostomy. No new suspicious nodularity along the suture line.      Discussed the use of AI scribe software for clinical note transcription with the patient, who gave verbal consent to proceed.  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  He is on  biweekly chemotherapy with Famvir and Vectibix  for metastatic colon cancer with pulmonary involvement and has tolerated treatment without new symptoms over the past two weeks. He denies  cough, hemoptysis, chest pain, diarrhea, rash, fevers, chills, or night sweats. He reports weight gain and good oral intake. He remains adherent to magnesium  and potassium supplementation after meals and to antibiotics.  Recent CT shows a very small right pleural effusion and a small stable lung nodule. Previously noted lung metastases are now very small. No new hepatic lesions are seen. These changes were discussed as possibly representing residual malignancy or post-treatment change.  He does not smoke. His caregiver notes only rhinorrhea attributed to allergies, without cough or sputum. He has hearing impairment managed by his primary care provider, which does not affect his cancer treatment.     All other systems were reviewed with the patient and are negative.  MEDICAL HISTORY:  Past Medical History:  Diagnosis Date   Cancer (HCC)    Schizophrenia [facility])     SURGICAL HISTORY: Past Surgical History:  Procedure Laterality Date   BRONCHIAL BIOPSY  05/11/2022   Procedure: BRONCHIAL BIOPSIES;  Surgeon: Shelah Lamar RAMAN, MD;  Location[facility] ENDOSCOPY;  Service: Pulmonary;;   BRONCHIAL BRUSHINGS  05/11/2022   Procedure: BRONCHIAL BRUSHINGS;  Surgeon: Shelah Lamar RAMAN, MD;  Location: [facility] ENDOSCOPY;  Service: Pulmonary;;   BRONCHIAL NEEDLE ASPIRATION BIOPSY  05/11/2022   Procedure: BRONCHIAL NEEDLE ASPIRATION BIOPSIES;  Surgeon: Shelah Lamar RAMAN, MD;  Location: [facility] ENDOSCOPY;  Service: Pulmonary;;   BRONCHIAL WASHINGS  05/11/2022   Procedure: BRONCHIAL WASHINGS;  Surgeon: Shelah Lamar RAMAN, MD;  Location: [facility] ENDOSCOPY;  Service: Pulmonary;;   IR IMAGING GUIDED PORT INSERTION  05/25/2022   LAPAROTOMY N/A 08/08/2021   Procedure: EXPLORATORY LAPAROTOMY;  Surgeon: [name], MD;  Location: [facility] OR;  Service: General;   Laterality: N/A;   PARTIAL COLECTOMY N/A 08/08/2021   Procedure: PARTIAL COLECTOMY WITH COLOSTOMY;  Surgeon: [name], MD;  Location: [facility] OR;  Service: General;  Laterality: N/A;   VIDEO BRONCHOSCOPY WITH RADIAL ENDOBRONCHIAL ULTRASOUND  05/11/2022   Procedure: VIDEO BRONCHOSCOPY WITH RADIAL ENDOBRONCHIAL ULTRASOUND;  Surgeon: Shelah Lamar RAMAN, MD;  Location: [facility] ENDOSCOPY;  Service: Pulmonary;;    I have reviewed the social history and family history with the patient and they are unchanged from previous note.  ALLERGIES:  has no known allergies.  MEDICATIONS:  Current Outpatient Medications  Medication Sig Dispense Refill   acetaminophen  (TYLENOL ) 500 MG tablet Take 2 tablets (1,000 mg total) by mouth every 6 (six) hours as needed for mild pain or moderate pain.  0   apixaban  (ELIQUIS ) 5 MG TABS tablet Take 1 tablet (5 mg total) by mouth 2 (two) times daily. 60 tablet 2   aspirin EC 81 MG tablet Take 81 mg by mouth daily as needed (for pain or headaches).      benztropine  (COGENTIN ) 1 MG tablet Take 1 mg at bedtime by mouth.     clindamycin  (CLINDAGEL) 1 % gel Apply topically 2 (two) times daily. To skin rash on face and upper body 30 g 0   ferrous sulfate  325 (65 FE) MG EC tablet Take 1 tablet (325 mg total) by mouth daily. 30 tablet 3   hydrocortisone  cream 1 % Apply 1 Application topically 2 (two) times daily as needed for itching. For rash 30 g 2   loperamide  (IMODIUM ) 2 MG capsule Take 1 capsule (2 mg total) by mouth as needed for diarrhea or loose stools. 30 capsule 0   magnesium  oxide (MAG-OX) 400 (240 Mg) MG tablet Take 1 tablet (400 mg total) by mouth 2 (two) times daily. 30 tablet  2   Multiple Vitamin (MULTIVITAMIN WITH MINERALS) TABS tablet Take 1 tablet by mouth daily.     ondansetron  (ZOFRAN ) 8 MG tablet Take 1 tablet (8 mg total) by mouth every 8 (eight) hours as needed for nausea or vomiting. 30 tablet 0   paliperidone  (INVEGA  SUSTENNA) 156 MG/ML SUSP injection Inject 156  mg every 30 (thirty) days into the muscle.     polyethylene glycol (MIRALAX  / GLYCOLAX ) 17 g packet Take 17 g by mouth daily as needed for mild constipation or moderate constipation.  0   potassium chloride  SA (KLOR-CON  M) 20 MEQ tablet Take 1 tablet (20 mEq total) by mouth 2 (two) times daily. 60 tablet 2   prochlorperazine  (COMPAZINE ) 10 MG tablet TAKE 1 TABLET(10 MG) BY MOUTH EVERY 6 HOURS AS NEEDED FOR NAUSEA OR VOMITING 30 tablet 0   No current facility-administered medications for this visit.   Facility-Administered Medications Ordered in Other Visits  Medication Dose Route Frequency Provider Last Rate Last Admin   fluorouracil  (ADRUCIL ) 4,350 mg in sodium chloride  0.9 % 63 mL chemo infusion  2,400 mg/m2 (Treatment Plan Recorded) Intravenous 1 day or 1 dose [name], MD   Infusion Verify at 07/17/24 1613    PHYSICAL EXAMINATION: ECOG PERFORMANCE STATUS: 1 - Symptomatic but completely ambulatory  Vitals:   07/17/24 1002  BP: 95/65  Pulse: 92  Resp: 17  Temp: 97.7 F (36.5 C)  SpO2: 100%   Wt Readings from Last 3 Encounters:  07/17/24 146 lb 9.6 oz (66.5 kg)  07/03/24 143 lb 8 oz (65.1 kg)  06/19/24 141 lb 2 oz (64 kg)     GENERAL:alert, no distress and comfortable SKIN: skin color, texture, turgor are normal, no rashes or significant lesions EYES: normal, Conjunctiva are pink and non-injected, sclera clear NECK: supple, thyroid normal size, non-tender, without nodularity LYMPH:  no palpable lymphadenopathy in the cervical, axillary  LUNGS: clear to auscultation and percussion with normal breathing effort HEART: regular rate & rhythm and no murmurs and no lower extremity edema ABDOMEN:abdomen soft, non-tender and normal bowel sounds Musculoskeletal:no cyanosis of digits and no clubbing  NEURO: alert & oriented x 3 with fluent speech, no focal motor/sensory deficits  Physical Exam    LABORATORY DATA:  I have reviewed the data as listed    Latest Ref Rng & Units  07/17/2024    9:38 AM 07/03/2024   10:40 AM 06/19/2024   10:18 AM  CBC  WBC 4.0 - 10.5 K/uL 4.0  4.8  3.8   Hemoglobin 13.0 - 17.0 g/dL 89.4  89.1  88.4   Hematocrit 39.0 - 52.0 % 32.7  33.0  35.1   Platelets 150 - 400 K/uL 345  355  369         Latest Ref Rng & Units 07/17/2024    9:38 AM 07/03/2024   10:40 AM 06/19/2024   10:18 AM  CMP  Glucose 70 - 99 mg/dL 95  90  99   BUN 6 - 20 mg/dL 16  8  9    Creatinine 0.61 - 1.24 mg/dL 9.38  9.40  9.36   Sodium 135 - 145 mmol/L 138  139  139   Potassium 3.5 - 5.1 mmol/L 3.7  3.2  3.5   Chloride 98 - 111 mmol/L 107  106  108   CO2 22 - 32 mmol/L 22  22  24    Calcium  8.9 - 10.3 mg/dL 9.1  9.2  9.3   Total Protein 6.5 - 8.1  g/dL 6.3  6.5  6.4   Total Bilirubin 0.0 - 1.2 mg/dL 0.4  0.6  0.7   Alkaline Phos 38 - 126 U/L 113  119  111   AST 15 - 41 U/L 18  18  13    ALT 0 - 44 U/L 12  14  14        RADIOGRAPHIC STUDIES: I have personally reviewed the radiological images as listed and agreed with the findings in the report. No results found.    Orders Placed This Encounter  Procedures   CBC with Differential (Cancer Center Only)    Standing Status:   Future    Expected Date:   08/07/2024    Expiration Date:   08/07/2025   CMP (Cancer Center only)    Standing Status:   Future    Expected Date:   08/07/2024    Expiration Date:   08/08/2025   Magnesium     Standing Status:   Future    Expected Date:   08/07/2024    Expiration Date:   08/07/2025   CBC with Differential (Cancer Center Only)    Standing Status:   Future    Expected Date:   08/21/2024    Expiration Date:   08/21/2025   CMP (Cancer Center only)    Standing Status:   Future    Expected Date:   08/21/2024    Expiration Date:   08/22/2025   Magnesium     Standing Status:   Future    Expected Date:   08/21/2024    Expiration Date:   08/21/2025   CBC with Differential (Cancer Center Only)    Standing Status:   Future    Expected Date:   09/04/2024    Expiration Date:   09/04/2025    CMP (Cancer Center only)    Standing Status:   Future    Expected Date:   09/04/2024    Expiration Date:   09/05/2025   Magnesium     Standing Status:   Future    Expected Date:   09/04/2024    Expiration Date:   09/04/2025   All questions were answered. The patient knows to call the clinic with any problems, questions or concerns. No barriers to learning was detected. The total time spent in the appointment was 30 minutes, including review of chart and various tests results, discussions about plan of care and coordination of care plan     Onita Mattock, MD 07/17/2024

## 2024-07-17 NOTE — Patient Instructions (Signed)
 [facility] - A DEPT OF [facility]   Discharge Instructions: Thank you for choosing [facility] to provide your oncology and hematology care.   If you have a lab appointment with the Cancer Center, please go directly to the Cancer Center and check in at the registration area.   Wear comfortable clothing and clothing appropriate for easy access to any Portacath or PICC line.   We strive to give you quality time with your provider. You may need to reschedule your appointment if you arrive late (15 or more minutes).  Arriving late affects you and other patients whose appointments are after yours.  Also, if you miss three or more appointments without notifying the office, you may be dismissed from the clinic at the provider's discretion.      For prescription refill requests, have your pharmacy contact our office and allow 72 hours for refills to be completed.    Today you received the following chemotherapy and/or immunotherapy agents: Panitumumab  (Vectibix ), Irinotecan , Leucovorin , and Fluorouracil  (Adrucil )      To help prevent nausea and vomiting after your treatment, we encourage you to take your nausea medication as directed.  BELOW ARE SYMPTOMS THAT SHOULD BE REPORTED IMMEDIATELY: *FEVER GREATER THAN 100.4 F (38 C) OR HIGHER *CHILLS OR SWEATING *NAUSEA AND VOMITING THAT IS NOT CONTROLLED WITH YOUR NAUSEA MEDICATION *UNUSUAL SHORTNESS OF BREATH *UNUSUAL BRUISING OR BLEEDING *URINARY PROBLEMS (pain or burning when urinating, or frequent urination) *BOWEL PROBLEMS (unusual diarrhea, constipation, pain near the anus) TENDERNESS IN MOUTH AND THROAT WITH OR WITHOUT PRESENCE OF ULCERS (sore throat, sores in mouth, or a toothache) UNUSUAL RASH, SWELLING OR PAIN  UNUSUAL VAGINAL DISCHARGE OR ITCHING   Items with * indicate a potential emergency and should be followed up as soon as possible or go to the Emergency Department if any problems should  occur.  Please show the CHEMOTHERAPY ALERT CARD or IMMUNOTHERAPY ALERT CARD at check-in to the Emergency Department and triage nurse.  Should you have questions after your visit or need to cancel or reschedule your appointment, please contact [facility] - A DEPT OF [facility]  Dept: [phone]  and follow the prompts.  Office hours are 8:00 a.m. to 4:30 p.m. Monday - Friday. Please note that voicemails left after 4:00 p.m. may not be returned until the following business day.  We are closed weekends and major holidays. You have access to a nurse at all times for urgent questions. Please call the main number to the clinic Dept: [phone] and follow the prompts.   For any non-urgent questions, you may also contact your provider using MyChart. We now offer e-Visits for anyone 50 and older to request care online for non-urgent symptoms. For details visit [email or web address].   Also download the MyChart app! Go to the app store, search MyChart, open the app, select , and log in with your MyChart username and password.  The chemotherapy medication bag should finish at 46 hours, 96 hours, or 7 days. For example, if your pump is scheduled for 46 hours and it was put on at 4:00 p.m., it should finish at 2:00 p.m. the day it is scheduled to come off regardless of your appointment time.     Estimated time to finish at    If the display on your pump reads Low Volume and it is beeping, take the batteries out of the pump and come  to the cancer center for it to be taken off.   If the pump alarms go off prior to the pump reading Low Volume then call [phone] and someone can assist you.  If the plunger comes out and the chemotherapy medication is leaking out, please use your home chemo spill kit to clean up the spill. Do NOT use paper towels or other household products.  If you have problems or questions regarding your pump, please call either  [phone] (24 hours a day) or the cancer center Monday-Friday 8:00 a.m.- 4:30 p.m. at the clinic number and we will assist you. If you are unable to get assistance, then go to the nearest Emergency Department and ask the staff to contact the IV team for assistance.

## 2024-07-19 ENCOUNTER — Inpatient Hospital Stay: Payer: MEDICAID

## 2024-07-19 VITALS — BP 91/68 | HR 101 | Temp 98.9°F | Resp 18

## 2024-07-19 DIAGNOSIS — C186 Malignant neoplasm of descending colon: Secondary | ICD-10-CM

## 2024-07-23 ENCOUNTER — Other Ambulatory Visit: Payer: Self-pay

## 2024-08-01 NOTE — Progress Notes (Unsigned)
 [facility] OFFICE PROGRESS NOTE  Medicine, [facility] [address] [place] [address]  DIAGNOSIS: f/u of metastatic colon cancer   Oncology History Overview Note   Cancer Staging  Cancer of left colon [facility]) Staging form: Colon and Rectum, AJCC 8th Edition - Pathologic stage from 08/08/2021: Stage IIIB (pT3, pN1a, cM0) - Signed by [name], MD on 08/26/2021    Cancer of left colon (HCC)  04/01/2020 Imaging   IMPRESSION: 1. Gallbladder decompressed bowel also partially calcified gallstones. No pericholecystic inflammation though if there is persisting clinical concern for cholecystitis right upper quadrant ultrasound could be obtained. 2. Circumferential thickening of the distal thoracic esophagus. Could reflect features of esophagitis. Correlate with clinical symptoms and consider endoscopy as clinically warranted. 3. Additional segmental thickening of the mid to distal sigmoid with focal narrowing. No acute surrounding inflammation or resulting obstruction. Findings are nonspecific, and could reflect sequela of prior inflammation/infection. However, recommend correlation with colonoscopy if not recently performed. 4. Mild circumferential bladder wall thickening and indentation of the bladder base by an enlarged prostate. Possibly sequela of chronic outlet obstruction though could correlate with urinalysis to exclude cystitis. 5. Aortic Atherosclerosis (ICD10-I70.0).   08/06/2021 Imaging   IMPRESSION: Sigmoid colonic perforation with small free intraperitoneal gas and infiltration of the mesenteric and omental fat in keeping with changes of peritonitis.   Long segment inflammatory stranding of the sigmoid colon in keeping with a severe infectious or inflammatory colitis. This terminates an area of irregular mural thickening, infiltrative soft tissue within the colonic mesentery, and focal dystrophic calcification. This may represent a chronic  inflammatory process, however, a perforated malignancy could appear similarly. There are 2 separate points of perforation which again raise the question of an underlying malignancy.   7.1 cm gas and fluid containing pericolonic abscess within the sigmoid mesentery.   Marked inflammatory change of the terminal ileum adjacent to the pericolonic abscess with resultant small bowel obstruction. Fluid within the distal esophagus likely relates to gastroesophageal reflux the setting of vomiting.   Aortic Atherosclerosis (ICD10-I70.0).   08/08/2021 Cancer Staging   Staging form: Colon and Rectum, AJCC 8th Edition - Pathologic stage from 08/08/2021: Stage IIIB (pT3, pN1a, cM0) - Signed by [name], MD on 08/26/2021 Stage prefix: Initial diagnosis Total positive nodes: 1 Histologic grading system: 4 grade system Histologic grade (G): G2 Residual tumor (R): R0 - None   08/08/2021 Definitive Surgery   FINAL MICROSCOPIC DIAGNOSIS:   A. COLON, SIGMOID, PARTIAL COLECTOMY:  - Invasive moderately differentiated adenocarcinoma.  - Metastatic carcinoma involving one of twelve lymph nodes (1/12).  - See oncology table below.   ADDENDUM:  Mismatch Repair Protein (IHC)  SUMMARY INTERPRETATION: NORMAL    08/14/2021 Imaging   EXAM: CT ABDOMEN AND PELVIS WITH CONTRAST  IMPRESSION: 1. Post recent sigmoid colectomy with left lower quadrant colostomy. Two small residual foci of air within the pelvic mesentery with mild adjacent thickening, but no abscess or drainable collection. Trace non organized free fluid and stranding in the pelvis. 2. Short segment of small bowel wall thickening and inflammation in the pelvis involving the distal ileum, likely reactive. 3. Dilated distal esophagus, stomach, and small bowel, without discrete transition point, favoring postoperative ileus. 4. Small bilateral pleural effusions and compressive atelectasis. 5. Heterogeneous partially enhancing 14 mm lymph node in  the retroperitoneum at the aortoiliac bifurcation, not significantly changed from prior exam. Suspected additional lymph nodes in the anterior common iliac space, not significantly changed from prior  exam. Recommend attention at follow-up. 6. Additional chronic findings as described.     08/15/2021 Imaging   EXAM: CT CHEST WITH CONTRAST  IMPRESSION: 1. No evidence of thoracic metastasis. 2. Bilateral small layering pleural effusions with passive atelectasis   08/26/2021 Initial Diagnosis   Cancer of left colon (HCC)   10/10/2021 - 12/12/2021 Chemotherapy   Patient is on Treatment Plan : COLORECTAL Xelox (Capeox) q21d     05/28/2022 - 05/28/2022 Chemotherapy   Patient is on Treatment Plan : COLORECTAL FOLFIRI + Bevacizumab q14d     05/28/2022 -  Chemotherapy   Patient is on Treatment Plan : COLORECTAL FOLFIRI + Panitumumab  q14d     07/31/2022 Imaging    IMPRESSION: Decreased bilateral pulmonary metastases.   Stable mild abdominal lymphadenopathy.   No new or progressive metastatic disease within the chest, abdomen, or pelvis.   01/18/2023 Imaging    IMPRESSION: 1. Status post Hetty pouch sigmoid colon resection with left lower quadrant end colostomy and rectal stump. Unchanged, mild wall thickening of the decompressed distal colon at the ileostomy, as well as the rectal stump. 2. Unchanged small, treated metastatic pulmonary nodules. No new nodules. 3. Unchanged enlarged, coarsely calcified treated lower aortocaval metastatic lymph nodes. No new lymphadenopathy. 4. Coronary artery disease. 5. Cholelithiasis.   04/23/2023 Imaging   CT chest, abdomen, and pelvis with contrast  IMPRESSION: 1. Left lower lobe segmental pulmonary emboli, incidental finding. No evidence for right heart strain. 2. Stable trace bilateral pleural effusions. 3. Stable enlarged lower left paraesophageal lymph node. 4. Stable calcified retroperitoneal lymph nodes. No new enlarged lymph  nodes in the chest, abdomen or pelvis. 5. Stable subcentimeter soft tissue nodules within the mesenteric fat along the inferior ostomy. 6. Left Bosniak I benign renal cyst measuring 4.6 cm. No follow-up imaging is recommended. JACR 2018 Feb; 264-273, Management of the Incidental Renal Mass on CT, [employer] 2021; 814-848, Bosniak Classification of Cystic Renal Masses, Version 2019.   07/15/2023 Imaging   CT chest abdomen and pelvis with contrast IMPRESSION: 1. Prior partial left hemicolectomy with end colostomy in the left anterior abdominal wall. Unchanged mild wall thickening of the decompressed distal colon at the ileostomy as well as the rectal stump/Hartmann's pouch. 2. Stable enlarged left lower paraesophageal lymph node. 3. Increased size of the treated pulmonary nodule in the lingula now measuring 6 mm previously 4 mm with increased in size favored artifactual given motion degrading examination of the lung bases. Suggest attention on follow-up imaging 4. Unchanged size of the treated pulmonary nodule in the right middle lobe measuring 4 mm. 5. Increased size of prominent bilateral axillary lymph nodes measuring up to 7 mm in short axis, nonspecific. Suggest attention on follow-up imaging 6. No convincing evidence of new or progressive disease in the chest, abdomen or pelvis. 7.  Aortic Atherosclerosis (ICD10-I70.0).     10/06/2023 Imaging   CT chest abdomen pelvis with contrast IMPRESSION: 1. Increased size of the pulmonary nodule in the lingula now measuring 7 mm, concerning for pulmonary metastatic disease recurrence. 2. Additional bilateral pulmonary nodules are stable. 3. Stable prominent bilateral axillary, low paraesophageal and retroperitoneal lymph nodes. 4. Prior partial left hemicolectomy with Hartmann's pouch formation and left anterior abdominal wall colostomy. No suspicious nodularity along the Hartmann's suture line. 5. Patulous esophagus with symmetric distal esophageal  wall thickening, similar prior. 6. Trace right pleural effusion. 7.  Aortic Atherosclerosis (ICD10-I70.0).     12/30/2023 Imaging   CT chest, abdomen, and pelvis with contrast IMPRESSION: 1.  Increased size of bilateral pulmonary nodules with new 4 mm left upper lobe and 5 mm superior segment right lower lobe pulmonary nodules, compatible with worsening pulmonary metastatic disease. 2. Stable prominent bilateral axillary and mediastinal lymph nodes. 3. Stable coarsely calcified retroperitoneal lymph nodes measuring up to 10 mm in short axis. No new pathologically enlarged abdominal or pelvic lymph nodes. 4. Stable trace right pleural effusion. 5. Prior partial left hemicolectomy with Hartmann's pouch formation and left anterior abdominal wall colostomy. No new suspicious nodularity along the suture line.      CURRENT THERAPY: FOLFIRI and Vectibix  every 2 weeks   INTERVAL HISTORY: Matthew [name] 58 y.o. 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    He is on biweekly chemotherapy with Folfiri and Vectibix  for metastatic colon cancer with pulmonary involvement and has tolerated treatment without new symptoms over the past two weeks. His mom wants to know if he can get the flu shot.   It looks like he consistently has hypomagnesemia from his treatment. He receives 4 g IV with treatment. He has a prescription from 6/30 with two refills. The patient's mother is certain he has been taking his magnesium  oxide TID. However, he should have been out of this by now. Of note, the patient has schizophrenia and some cognitive impairment. His mother also has some memory impairment.    He denies cough, hemoptysis, chest pain, shortness of breath, diarrhea, rash, fevers, chills, or night sweats. He reportedly has a good appetite. He maintains good oral intake and denies abdominal pain, nausea, vomiting, diarrhea, constipation,  or rash.  He does not smoke. His mother notes only rhinorrhea attributed to allergies, without cough or sputum that is chronic and unchanged. It sounds like he does not take any antihistamines.    He is here today for evaluation and repeat blood work before undergoing cycle #56.    MEDICAL HISTORY: Past Medical History:  Diagnosis Date   Cancer (HCC)    Schizophrenia (HCC)     ALLERGIES:  has no known allergies.  MEDICATIONS:  Current Outpatient Medications  Medication Sig Dispense Refill   acetaminophen  (TYLENOL ) 500 MG tablet Take 2 tablets (1,000 mg total) by mouth every 6 (six) hours as needed for mild pain or moderate pain.  0   apixaban  (ELIQUIS ) 5 MG TABS tablet Take 1 tablet (5 mg total) by mouth 2 (two) times daily. 60 tablet 2   aspirin EC 81 MG tablet Take 81 mg by mouth daily as needed (for pain or headaches).      benztropine  (COGENTIN ) 1 MG tablet Take 1 mg at bedtime by mouth.     clindamycin  (CLINDAGEL) 1 % gel Apply topically 2 (two) times daily. To skin rash on face and upper body 30 g 0   ferrous sulfate  325 (65 FE) MG EC tablet Take 1 tablet (325 mg total) by mouth daily. 30 tablet 3   hydrocortisone  cream 1 % Apply 1 Application topically 2 (two) times daily as needed for itching. For rash 30 g 2   loperamide  (IMODIUM ) 2 MG capsule Take 1 capsule (2 mg total) by mouth as needed for diarrhea or loose stools. 30 capsule 0   magnesium  oxide (MAG-OX) 400 (240 Mg) MG tablet Take 1 tablet (400 mg total) by mouth 2 (two) times daily. 30 tablet 2   Multiple Vitamin (MULTIVITAMIN WITH MINERALS) TABS tablet Take 1 tablet by mouth daily.  ondansetron  (ZOFRAN ) 8 MG tablet Take 1 tablet (8 mg total) by mouth every 8 (eight) hours as needed for nausea or vomiting. 30 tablet 0   paliperidone  (INVEGA  SUSTENNA) 156 MG/ML SUSP injection Inject 156 mg every 30 (thirty) days into the muscle.     polyethylene glycol (MIRALAX  / GLYCOLAX ) 17 g packet Take 17 g by mouth daily as needed  for mild constipation or moderate constipation.  0   potassium chloride  SA (KLOR-CON  M) 20 MEQ tablet Take 1 tablet (20 mEq total) by mouth 2 (two) times daily. 60 tablet 2   prochlorperazine  (COMPAZINE ) 10 MG tablet TAKE 1 TABLET(10 MG) BY MOUTH EVERY 6 HOURS AS NEEDED FOR NAUSEA OR VOMITING 30 tablet 0   No current facility-administered medications for this visit.    SURGICAL HISTORY:  Past Surgical History:  Procedure Laterality Date   BRONCHIAL BIOPSY  05/11/2022   Procedure: BRONCHIAL BIOPSIES;  Surgeon: Shelah Lamar RAMAN, MD;  Location: [facility] ENDOSCOPY;  Service: Pulmonary;;   BRONCHIAL BRUSHINGS  05/11/2022   Procedure: BRONCHIAL BRUSHINGS;  Surgeon: Shelah Lamar RAMAN, MD;  Location: [facility] ENDOSCOPY;  Service: Pulmonary;;   BRONCHIAL NEEDLE ASPIRATION BIOPSY  05/11/2022   Procedure: BRONCHIAL NEEDLE ASPIRATION BIOPSIES;  Surgeon: Shelah Lamar RAMAN, MD;  Location: [facility] ENDOSCOPY;  Service: Pulmonary;;   BRONCHIAL WASHINGS  05/11/2022   Procedure: BRONCHIAL WASHINGS;  Surgeon: Shelah Lamar RAMAN, MD;  Location: [facility] ENDOSCOPY;  Service: Pulmonary;;   IR IMAGING GUIDED PORT INSERTION  05/25/2022   LAPAROTOMY N/A 08/08/2021   Procedure: EXPLORATORY LAPAROTOMY;  Surgeon: [name], MD;  Location: [facility] OR;  Service: General;  Laterality: N/A;   PARTIAL COLECTOMY N/A 08/08/2021   Procedure: PARTIAL COLECTOMY WITH COLOSTOMY;  Surgeon: [name], MD;  Location: [facility] OR;  Service: General;  Laterality: N/A;   VIDEO BRONCHOSCOPY WITH RADIAL ENDOBRONCHIAL ULTRASOUND  05/11/2022   Procedure: VIDEO BRONCHOSCOPY WITH RADIAL ENDOBRONCHIAL ULTRASOUND;  Surgeon: Shelah Lamar RAMAN, MD;  Location: [facility] ENDOSCOPY;  Service: Pulmonary;;    REVIEW OF SYSTEMS:   Review of Systems  Constitutional: Negative for appetite change, chills, fatigue, fever and unexpected weight change.  HENT: Positive for intermittent nasal congestion (chronic). Negative for mouth sores, nosebleeds, sore throat and trouble swallowing.   Eyes:  Negative for eye problems and icterus.  Respiratory: Negative for cough, hemoptysis, shortness of breath and wheezing.   Cardiovascular: Negative for chest pain and leg swelling.  Gastrointestinal: Negative for abdominal pain, constipation, diarrhea, nausea and vomiting.  Genitourinary: Negative for bladder incontinence, difficulty urinating, dysuria, frequency and hematuria.   Musculoskeletal: Negative for back pain, gait problem, neck pain and neck stiffness.  Skin: Negative for itching and rash.  Neurological: Negative for dizziness, extremity weakness, gait problem, headaches, light-headedness and seizures.  Hematological: Negative for adenopathy. Does not bruise/bleed easily.  Psychiatric/Behavioral: Negative for confusion, depression and sleep disturbance. The patient is not nervous/anxious.     PHYSICAL EXAMINATION:  Blood pressure 111/88, pulse 93, temperature (!) 97.5 F (36.4 C), temperature source Temporal, resp. rate 15, height 5' 9 (1.753 m), weight 145 lb 9.6 oz (66 kg), SpO2 98%.  ECOG PERFORMANCE STATUS: 1  Physical Exam  Constitutional: Oriented to person, place, and time and thin appearing male, and in no distress.  HENT:  Head: Normocephalic and atraumatic.  Mouth/Throat: Oropharynx is clear and moist. No oropharyngeal exudate.  Eyes: Conjunctivae are normal. Right eye exhibits no discharge. Left eye exhibits no discharge. No scleral icterus.  Neck: Normal range of motion. Neck supple.  Cardiovascular: Normal rate, regular rhythm, normal heart sounds and intact distal pulses.   Pulmonary/Chest: Effort normal and breath sounds normal. No respiratory distress. No wheezes. No rales.  Abdominal: Soft. Bowel sounds are normal. Exhibits no distension and no mass. There is no tenderness. Colostomy bag noted.  Musculoskeletal: Normal range of motion. Exhibits no edema.  Lymphadenopathy:    No cervical adenopathy.  Neurological: Alert and oriented to person, place, and time.  Exhibits normal muscle tone. Gait normal. Coordination normal.  Skin: Skin is warm and dry. No rash noted. Not diaphoretic. No erythema. No pallor.  Psychiatric: Mood, memory and judgment normal. Patient not very communicative due to his cognitive impairment.  Vitals reviewed.  LABORATORY DATA: Lab Results  Component Value Date   WBC 4.0 08/04/2024   HGB 11.0 (L) 08/04/2024   HCT 33.9 (L) 08/04/2024   MCV 80.0 08/04/2024   PLT 410 (H) 08/04/2024      Chemistry      Component Value Date/Time   NA 141 08/04/2024 0854   K 3.8 08/04/2024 0854   CL 109 08/04/2024 0854   CO2 23 08/04/2024 0854   BUN 9 08/04/2024 0854   CREATININE 0.62 08/04/2024 0854      Component Value Date/Time   CALCIUM  9.0 08/04/2024 0854   ALKPHOS 128 (H) 08/04/2024 0854   AST 22 08/04/2024 0854   ALT 17 08/04/2024 0854   BILITOT 0.4 08/04/2024 0854       RADIOGRAPHIC STUDIES:  CT CHEST ABDOMEN PELVIS W CONTRAST Result Date: 07/17/2024 CLINICAL DATA:  Colon cancer, assess treatment response. * Tracking Code: BO * EXAM: CT CHEST, ABDOMEN, AND PELVIS WITH CONTRAST TECHNIQUE: Multidetector CT imaging of the chest, abdomen and pelvis was performed following the standard protocol during bolus administration of intravenous contrast. RADIATION DOSE REDUCTION: This exam was performed according to the departmental dose-optimization program which includes automated exposure control, adjustment of the mA and/or kV according to patient size and/or use of iterative reconstruction technique. CONTRAST:  OMNIPAQUE  IOHEXOL  300 MG/ML  SOLN COMPARISON:  04/04/2024 and 12/30/2023. FINDINGS: CT CHEST FINDINGS Cardiovascular: Right IJ Port-A-Cath terminates in the right atrium. Age advanced distal left main coronary artery calcification. Heart size normal. Trace pericardial effusion. Mediastinum/Nodes: Thoracic inlet lymph nodes are not enlarged by CT size criteria. No pathologically enlarged mediastinal, hilar or axillary  lymph nodes. Air in the esophagus can be seen with dysmotility. Similar distal esophageal wall thickening. Lungs/Pleura: Spiculated 6 mm lateral segment right middle lobe nodule (6/100), unchanged from 12/30/2023. Spiculated 10 mm nodule in the inferior lingula (9 x 11 mm, 6/91), also unchanged. Nodule or nodular consolidation in the anterolateral inferior left lower lobe measures 6 mm (6/114), new from 04/04/2024. Trace bilateral pleural effusions, right greater than left. Airway is unremarkable. Musculoskeletal: No worrisome lytic or sclerotic lesions. CT ABDOMEN PELVIS FINDINGS Hepatobiliary: Subcentimeter faint low-attenuation lesion in the right hepatic lobe (2/57), unchanged. Thin gallbladder wall calcification. Liver and gallbladder are otherwise grossly unremarkable. Pancreas: Negative. Spleen: Negative. Adrenals/Urinary Tract: Adrenal glands and right kidney are unremarkable. Low-attenuation lesions in the left kidney. No specific follow-up necessary. Ureters are decompressed. Bladder is grossly unremarkable. Stomach/Bowel: Stomach and small bowel are unremarkable. Appendix is not readily visualized. Partial colectomy with a left lower quadrant colostomy. Colon is otherwise unremarkable. Vascular/Lymphatic: Atherosclerotic calcification of the aorta. Calcified low right periaortic and iliac bifurcation lymph nodes. Small lymph nodes in the sigmoid mesentery measure up to 5 mm (2/98), stable. Reproductive: Prostate is visualized. Other: No free fluid.  Mesenteries and peritoneum are otherwise unremarkable. Musculoskeletal: No worrisome lytic or sclerotic lesions. IMPRESSION: 1. New nodule or nodular consolidation in the inferolateral left lower lobe. Recommend attention on follow-up as new metastatic disease cannot be excluded. Right middle lobe and lingular nodules are stable. 2. Partial colectomy with a left lower quadrant colostomy. Tiny sigmoid mesocolon lymph nodes are unchanged. 3. Age advanced distal  left main coronary artery calcification. 4. Tiny low-attenuation lesion in the right hepatic lobe, stable but too small to characterize. 5. Trace pericardial effusion. 6. Trace bilateral pleural effusions. 7.  Aortic atherosclerosis (ICD10-I70.0). Electronically Signed   By: Newell Eke M.D.   On: 07/17/2024 08:30     ASSESSMENT/PLAN:  Cancer of left colon (HCC) stage IIIB p(T3, N1aM0), MSS, KRAS/NRAS/BRAF wild type, lung and node metastasis in 04/2022  -Initially diagnosed in 08/2021 -he completed 4 cycles adjuvant CAPOX 10/10/21 - 12/25/21 (though he somehow was still taking Xeloda  through ~02/09/22 due to misunderstandings).  -He had a metastatic recurrence in the lungs and retroperitoneal adenopathy in September 2023 -he started FOLFIRI on 05/08/2022, Vectibix  added from cycle 2, tolerating well overall -restaging CT scan image from August 02, 2022 and 10/28/2022 both showed good partial response in pulmonary metastasis, no other new lesions. -Dr. Lanny changed his treatment to maintenance therapy with 5-fu and vectibix  in late March 2024 -He is tolerating treatment very well, will continue. -due to metastasis in both lungs and retroperitoneal lymph nodes, he is not a candidate for surgery or local therapy  -restaging CT 10/06/2023 showed stable disease overall except increased size of the pulmonary nodule in the lingula now measuring 7 mm Will continue current therapy. - Restaging CT scan on May 29th 2025 showed mild disease progression in the lung, Dr. Lanny changed his chemotherapy back to FOLFIRI and Vectibix  on 01/03/2024 -CT 04/04/2024 showed stable disease, will continue current therapy     Assessment & Plan Metastatic colon cancer to the lung -He is tolerating FOLFIRI and Vectibix  without significant adverse effects and is adherent to supportive medications. Overall, he is responding well to treatment. - Continued current chemotherapy regimen (FOLFIRI and Vectibix ) every two weeks. -  Reinforced adherence to magnesium  and potassium supplementation after each meal. I am uncertain whether he is compliant with this as he should have been out of his medication a while ago. I wrote down the name of the medication for magnesium  for the patient's mother. I asked her to check the medication bottles at home to make sure he is taking this. I sent a refill in case he does not have it. I have changed his IV magnesium  on Tuesday to 6 g. I encouraged the patient to receive 2 g IV of magnesium  today and 4 g as planned Tuesday with his treatment but he declined. His magnesium  continues to be low at 1.3 despite receiving 4 g with treatment and reportedly taking magnesium  TID at home.  - Scheduled next chemotherapy sessions for January 6 and January 19.  -Chronic rhinorrhea and nasal congestion consistent with chronic rhinitis, without signs of acute infection. - Recommended trial of over-the-counter antihistamines (loratadine or cetirizine ).      Plan - Lab reviewed, adequate for treatment, will proceed with treatment Tuesday as scheduled  - Will receive 6 g of magnesium . Refilled magnesium  oxide p.o. Wrote down instructions for his mother to check the medication bottles at home and ensure he is compliant with this.  -He will get his flu shot on his off week of treatment.  -  f/u in 2 weeks    No orders of the defined types were placed in this encounter.   The total time spent in the appointment was 20-29 minutes  Yakima Kreitzer L Marsa Matteo, PA-C 08/04/2024

## 2024-08-02 ENCOUNTER — Other Ambulatory Visit: Payer: Self-pay

## 2024-08-04 ENCOUNTER — Inpatient Hospital Stay: Payer: MEDICAID

## 2024-08-04 ENCOUNTER — Inpatient Hospital Stay: Payer: MEDICAID | Attending: Hematology | Admitting: Physician Assistant

## 2024-08-04 VITALS — BP 111/88 | HR 93 | Temp 97.5°F | Resp 15 | Ht 69.0 in | Wt 145.6 lb

## 2024-08-04 DIAGNOSIS — C772 Secondary and unspecified malignant neoplasm of intra-abdominal lymph nodes: Secondary | ICD-10-CM | POA: Diagnosis not present

## 2024-08-04 DIAGNOSIS — Z5112 Encounter for antineoplastic immunotherapy: Secondary | ICD-10-CM | POA: Insufficient documentation

## 2024-08-04 DIAGNOSIS — C7802 Secondary malignant neoplasm of left lung: Secondary | ICD-10-CM | POA: Diagnosis not present

## 2024-08-04 DIAGNOSIS — Z79899 Other long term (current) drug therapy: Secondary | ICD-10-CM | POA: Diagnosis not present

## 2024-08-04 DIAGNOSIS — C7801 Secondary malignant neoplasm of right lung: Secondary | ICD-10-CM | POA: Insufficient documentation

## 2024-08-04 DIAGNOSIS — Z79634 Long term (current) use of topoisomerase inhibitor: Secondary | ICD-10-CM | POA: Insufficient documentation

## 2024-08-04 DIAGNOSIS — C186 Malignant neoplasm of descending colon: Secondary | ICD-10-CM

## 2024-08-04 DIAGNOSIS — Z5111 Encounter for antineoplastic chemotherapy: Secondary | ICD-10-CM | POA: Insufficient documentation

## 2024-08-04 DIAGNOSIS — Z452 Encounter for adjustment and management of vascular access device: Secondary | ICD-10-CM | POA: Diagnosis not present

## 2024-08-04 DIAGNOSIS — R0981 Nasal congestion: Secondary | ICD-10-CM | POA: Diagnosis not present

## 2024-08-04 DIAGNOSIS — J3489 Other specified disorders of nose and nasal sinuses: Secondary | ICD-10-CM | POA: Diagnosis not present

## 2024-08-04 DIAGNOSIS — Z79631 Long term (current) use of antimetabolite agent: Secondary | ICD-10-CM | POA: Diagnosis not present

## 2024-08-04 LAB — CBC WITH DIFFERENTIAL (CANCER CENTER ONLY)
Abs Immature Granulocytes: 0.01 K/uL (ref 0.00–0.07)
Basophils Absolute: 0 K/uL (ref 0.0–0.1)
Basophils Relative: 1 %
Eosinophils Absolute: 0.2 K/uL (ref 0.0–0.5)
Eosinophils Relative: 5 %
HCT: 33.9 % — ABNORMAL LOW (ref 39.0–52.0)
Hemoglobin: 11 g/dL — ABNORMAL LOW (ref 13.0–17.0)
Immature Granulocytes: 0 %
Lymphocytes Relative: 26 %
Lymphs Abs: 1 K/uL (ref 0.7–4.0)
MCH: 25.9 pg — ABNORMAL LOW (ref 26.0–34.0)
MCHC: 32.4 g/dL (ref 30.0–36.0)
MCV: 80 fL (ref 80.0–100.0)
Monocytes Absolute: 0.4 K/uL (ref 0.1–1.0)
Monocytes Relative: 10 %
Neutro Abs: 2.3 K/uL (ref 1.7–7.7)
Neutrophils Relative %: 58 %
Platelet Count: 410 K/uL — ABNORMAL HIGH (ref 150–400)
RBC: 4.24 MIL/uL (ref 4.22–5.81)
RDW: 18.6 % — ABNORMAL HIGH (ref 11.5–15.5)
WBC Count: 4 K/uL (ref 4.0–10.5)
nRBC: 0 % (ref 0.0–0.2)

## 2024-08-04 LAB — CMP (CANCER CENTER ONLY)
ALT: 17 U/L (ref 0–44)
AST: 22 U/L (ref 15–41)
Albumin: 3.9 g/dL (ref 3.5–5.0)
Alkaline Phosphatase: 128 U/L — ABNORMAL HIGH (ref 38–126)
Anion gap: 10 (ref 5–15)
BUN: 9 mg/dL (ref 6–20)
CO2: 23 mmol/L (ref 22–32)
Calcium: 9 mg/dL (ref 8.9–10.3)
Chloride: 109 mmol/L (ref 98–111)
Creatinine: 0.62 mg/dL (ref 0.61–1.24)
GFR, Estimated: >60 mL/min
Glucose, Bld: 111 mg/dL — ABNORMAL HIGH (ref 70–99)
Potassium: 3.8 mmol/L (ref 3.5–5.1)
Sodium: 141 mmol/L (ref 135–145)
Total Bilirubin: 0.4 mg/dL (ref 0.0–1.2)
Total Protein: 6.3 g/dL — ABNORMAL LOW (ref 6.5–8.1)

## 2024-08-04 LAB — MAGNESIUM: Magnesium: 1.3 mg/dL — ABNORMAL LOW (ref 1.7–2.4)

## 2024-08-04 MED ORDER — MAGNESIUM OXIDE -MG SUPPLEMENT 400 (240 MG) MG PO TABS: 400.0000 mg | ORAL_TABLET | Freq: Two times a day (BID) | ORAL | 2 refills | Status: AC

## 2024-08-06 ENCOUNTER — Other Ambulatory Visit: Payer: Self-pay

## 2024-08-08 ENCOUNTER — Encounter: Payer: Self-pay | Admitting: Hematology

## 2024-08-08 ENCOUNTER — Inpatient Hospital Stay: Payer: MEDICAID

## 2024-08-08 ENCOUNTER — Other Ambulatory Visit: Payer: Self-pay

## 2024-08-08 VITALS — BP 113/82 | HR 96 | Temp 98.0°F | Resp 16

## 2024-08-08 DIAGNOSIS — Z5111 Encounter for antineoplastic chemotherapy: Secondary | ICD-10-CM | POA: Diagnosis not present

## 2024-08-08 DIAGNOSIS — C186 Malignant neoplasm of descending colon: Secondary | ICD-10-CM

## 2024-08-08 MED ORDER — SODIUM CHLORIDE 0.9 % IV SOLN
6.0000 mg/kg | Freq: Once | INTRAVENOUS | Status: AC
  Administered 2024-08-08: 400 mg via INTRAVENOUS
  Filled 2024-08-08: qty 20

## 2024-08-08 MED ORDER — PALONOSETRON HCL INJECTION 0.25 MG/5ML
0.2500 mg | Freq: Once | INTRAVENOUS | Status: AC
  Administered 2024-08-08: 0.25 mg via INTRAVENOUS
  Filled 2024-08-08: qty 5

## 2024-08-08 MED ORDER — SODIUM CHLORIDE 0.9 % IV SOLN: Freq: Once | INTRAVENOUS | Status: AC

## 2024-08-08 MED ORDER — SODIUM CHLORIDE 0.9 % IV SOLN
2400.0000 mg/m2 | INTRAVENOUS | Status: DC
  Administered 2024-08-08: 4350 mg via INTRAVENOUS
  Filled 2024-08-08: qty 87

## 2024-08-08 MED ORDER — MAGNESIUM SULFATE 2 GM/50ML IV SOLN
2.0000 g | Freq: Once | INTRAVENOUS | Status: AC
  Administered 2024-08-08: 2 g via INTRAVENOUS
  Filled 2024-08-08: qty 50

## 2024-08-08 MED ORDER — ATROPINE SULFATE 1 MG/ML IV SOLN
0.5000 mg | Freq: Once | INTRAVENOUS | Status: AC | PRN
  Administered 2024-08-08: 0.5 mg via INTRAVENOUS
  Filled 2024-08-08: qty 1

## 2024-08-08 MED ORDER — DEXAMETHASONE SOD PHOSPHATE PF 10 MG/ML IJ SOLN
10.0000 mg | Freq: Once | INTRAMUSCULAR | Status: AC
  Administered 2024-08-08: 10 mg via INTRAVENOUS

## 2024-08-08 MED ORDER — MAGNESIUM SULFATE 4 GM/100ML IV SOLN
4.0000 g | Freq: Once | INTRAVENOUS | Status: AC
  Administered 2024-08-08: 4 g via INTRAVENOUS
  Filled 2024-08-08: qty 100

## 2024-08-08 MED ORDER — SODIUM CHLORIDE 0.9 % IV SOLN: 6.0000 g | Freq: Once | INTRAVENOUS | Status: DC

## 2024-08-08 MED ORDER — ALTEPLASE 2 MG IJ SOLR
2.0000 mg | Freq: Once | INTRAMUSCULAR | Status: AC | PRN
  Administered 2024-08-08: 2 mg
  Filled 2024-08-08: qty 2

## 2024-08-08 MED ORDER — SODIUM CHLORIDE 0.9 % IV SOLN
400.0000 mg/m2 | Freq: Once | INTRAVENOUS | Status: AC
  Administered 2024-08-08: 728 mg via INTRAVENOUS
  Filled 2024-08-08: qty 25

## 2024-08-08 MED ORDER — SODIUM CHLORIDE 0.9 % IV SOLN
180.0000 mg/m2 | Freq: Once | INTRAVENOUS | Status: AC
  Administered 2024-08-08: 320 mg via INTRAVENOUS
  Filled 2024-08-08: qty 15

## 2024-08-08 NOTE — Progress Notes (Signed)
 Per Powell Lessen, NP - ok to infuse Fluorouracil  pump over 44 hours.  Rate will be 3.4 mL/hr.  Wilma Dollar, Pharm.D., CPP 08/08/2024@12 :59 PM

## 2024-08-08 NOTE — Patient Instructions (Signed)
 [facility] - A DEPT OF [facility]   Discharge Instructions: Thank you for choosing [facility] to provide your oncology and hematology care.   If you have a lab appointment with the Cancer Center, please go directly to the Cancer Center and check in at the registration area.   Wear comfortable clothing and clothing appropriate for easy access to any Portacath or PICC line.   We strive to give you quality time with your provider. You may need to reschedule your appointment if you arrive late (15 or more minutes).  Arriving late affects you and other patients whose appointments are after yours.  Also, if you miss three or more appointments without notifying the office, you may be dismissed from the clinic at the provider's discretion.      For prescription refill requests, have your pharmacy contact our office and allow 72 hours for refills to be completed.    Today you received the following chemotherapy and/or immunotherapy agents: Panitumumab  (Vectibix ), Irinotecan , Leucovorin , and Fluorouracil  (Adrucil )      To help prevent nausea and vomiting after your treatment, we encourage you to take your nausea medication as directed.  BELOW ARE SYMPTOMS THAT SHOULD BE REPORTED IMMEDIATELY: *FEVER GREATER THAN 100.4 F (38 C) OR HIGHER *CHILLS OR SWEATING *NAUSEA AND VOMITING THAT IS NOT CONTROLLED WITH YOUR NAUSEA MEDICATION *UNUSUAL SHORTNESS OF BREATH *UNUSUAL BRUISING OR BLEEDING *URINARY PROBLEMS (pain or burning when urinating, or frequent urination) *BOWEL PROBLEMS (unusual diarrhea, constipation, pain near the anus) TENDERNESS IN MOUTH AND THROAT WITH OR WITHOUT PRESENCE OF ULCERS (sore throat, sores in mouth, or a toothache) UNUSUAL RASH, SWELLING OR PAIN  UNUSUAL VAGINAL DISCHARGE OR ITCHING   Items with * indicate a potential emergency and should be followed up as soon as possible or go to the Emergency Department if any problems should  occur.  Please show the CHEMOTHERAPY ALERT CARD or IMMUNOTHERAPY ALERT CARD at check-in to the Emergency Department and triage nurse.  Should you have questions after your visit or need to cancel or reschedule your appointment, please contact [facility] - A DEPT OF [facility]  Dept: [phone]  and follow the prompts.  Office hours are 8:00 a.m. to 4:30 p.m. Monday - Friday. Please note that voicemails left after 4:00 p.m. may not be returned until the following business day.  We are closed weekends and major holidays. You have access to a nurse at all times for urgent questions. Please call the main number to the clinic Dept: [phone] and follow the prompts.   For any non-urgent questions, you may also contact your provider using MyChart. We now offer e-Visits for anyone 50 and older to request care online for non-urgent symptoms. For details visit [email or web address].   Also download the MyChart app! Go to the app store, search MyChart, open the app, select , and log in with your MyChart username and password.  The chemotherapy medication bag should finish at 46 hours, 96 hours, or 7 days. For example, if your pump is scheduled for 46 hours and it was put on at 4:00 p.m., it should finish at 2:00 p.m. the day it is scheduled to come off regardless of your appointment time.     Estimated time to finish at    If the display on your pump reads Low Volume and it is beeping, take the batteries out of the pump and come  to the cancer center for it to be taken off.   If the pump alarms go off prior to the pump reading Low Volume then call [phone] and someone can assist you.  If the plunger comes out and the chemotherapy medication is leaking out, please use your home chemo spill kit to clean up the spill. Do NOT use paper towels or other household products.  If you have problems or questions regarding your pump, please call either  [phone] (24 hours a day) or the cancer center Monday-Friday 8:00 a.m.- 4:30 p.m. at the clinic number and we will assist you. If you are unable to get assistance, then go to the nearest Emergency Department and ask the staff to contact the IV team for assistance.

## 2024-08-10 ENCOUNTER — Inpatient Hospital Stay: Payer: MEDICAID

## 2024-08-21 NOTE — Assessment & Plan Note (Signed)
 stage IIIB p(T3, N1aM0), MSS, KRAS/NRAS/BRAF wild type, lung and node metastasis in 04/2022  -Initially diagnosed in 08/2021 -he completed 4 cycles adjuvant CAPOX 10/10/21 - 12/25/21 (though he somehow was still taking Xeloda  through ~02/09/22 due to misunderstandings).  -He had a metastatic recurrence in the lungs and retroperitoneal adenopathy in September 2023 -he started FOLFIRI on 05/08/2022, Vectibix  added from cycle 2, tolerating well overall -restaging CT scan image from August 02, 2022 and 10/28/2022 both showed good partial response in pulmonary metastasis, no other new lesions. -we have changed his treatment to maintenance therapy with 5-fu and vectibix  in late March 2024 -He is tolerating treatment very well, will continue. -due to metastasis in both lungs and retroperitoneal lymph nodes, he is not a candidate for surgery or local therapy  -restaging CT 10/06/2023 showed stable disease overall except increased size of the pulmonary nodule in the lingula now measuring 7 mm Will continue current therapy. - Restaging CT scan on May 29th 2025 showed mild disease progression in the lung, I changed his chemotherapy back to FOLFIRI and Vectibix  on 01/03/2024 -CT 04/04/2024 showed stable disease, will continue current therapy

## 2024-08-22 ENCOUNTER — Inpatient Hospital Stay (HOSPITAL_BASED_OUTPATIENT_CLINIC_OR_DEPARTMENT_OTHER): Payer: MEDICAID | Admitting: Hematology

## 2024-08-22 ENCOUNTER — Inpatient Hospital Stay: Payer: MEDICAID

## 2024-08-22 ENCOUNTER — Inpatient Hospital Stay: Payer: MEDICAID | Admitting: Nurse Practitioner

## 2024-08-22 ENCOUNTER — Inpatient Hospital Stay (HOSPITAL_BASED_OUTPATIENT_CLINIC_OR_DEPARTMENT_OTHER): Payer: MEDICAID

## 2024-08-22 VITALS — HR 95 | Temp 98.6°F | Wt 147.4 lb

## 2024-08-22 VITALS — BP 115/70 | HR 92

## 2024-08-22 DIAGNOSIS — C186 Malignant neoplasm of descending colon: Secondary | ICD-10-CM | POA: Diagnosis not present

## 2024-08-22 DIAGNOSIS — Z5111 Encounter for antineoplastic chemotherapy: Secondary | ICD-10-CM | POA: Diagnosis not present

## 2024-08-22 LAB — CMP (CANCER CENTER ONLY)
ALT: 25 U/L (ref 0–44)
AST: 19 U/L (ref 15–41)
Albumin: 3.9 g/dL (ref 3.5–5.0)
Alkaline Phosphatase: 142 U/L — ABNORMAL HIGH (ref 38–126)
Anion gap: 11 (ref 5–15)
BUN: 13 mg/dL (ref 6–20)
CO2: 22 mmol/L (ref 22–32)
Calcium: 9.1 mg/dL (ref 8.9–10.3)
Chloride: 107 mmol/L (ref 98–111)
Creatinine: 0.74 mg/dL (ref 0.61–1.24)
GFR, Estimated: >60 mL/min
Glucose, Bld: 102 mg/dL — ABNORMAL HIGH (ref 70–99)
Potassium: 3.5 mmol/L (ref 3.5–5.1)
Sodium: 140 mmol/L (ref 135–145)
Total Bilirubin: 0.5 mg/dL (ref 0.0–1.2)
Total Protein: 6.4 g/dL — ABNORMAL LOW (ref 6.5–8.1)

## 2024-08-22 LAB — CBC WITH DIFFERENTIAL (CANCER CENTER ONLY)
Abs Immature Granulocytes: 0.01 K/uL (ref 0.00–0.07)
Basophils Absolute: 0 K/uL (ref 0.0–0.1)
Basophils Relative: 0 %
Eosinophils Absolute: 0.2 K/uL (ref 0.0–0.5)
Eosinophils Relative: 3 %
HCT: 33.1 % — ABNORMAL LOW (ref 39.0–52.0)
Hemoglobin: 10.8 g/dL — ABNORMAL LOW (ref 13.0–17.0)
Immature Granulocytes: 0 %
Lymphocytes Relative: 13 %
Lymphs Abs: 0.7 K/uL (ref 0.7–4.0)
MCH: 25.4 pg — ABNORMAL LOW (ref 26.0–34.0)
MCHC: 32.6 g/dL (ref 30.0–36.0)
MCV: 77.9 fL — ABNORMAL LOW (ref 80.0–100.0)
Monocytes Absolute: 0.5 K/uL (ref 0.1–1.0)
Monocytes Relative: 9 %
Neutro Abs: 4.2 K/uL (ref 1.7–7.7)
Neutrophils Relative %: 75 %
Platelet Count: 349 K/uL (ref 150–400)
RBC: 4.25 MIL/uL (ref 4.22–5.81)
RDW: 18.1 % — ABNORMAL HIGH (ref 11.5–15.5)
WBC Count: 5.5 K/uL (ref 4.0–10.5)
nRBC: 0 % (ref 0.0–0.2)

## 2024-08-22 LAB — MAGNESIUM: Magnesium: 1.4 mg/dL — ABNORMAL LOW (ref 1.7–2.4)

## 2024-08-22 MED ORDER — SODIUM CHLORIDE 0.9 % IV SOLN
400.0000 mg/m2 | Freq: Once | INTRAVENOUS | Status: AC
  Administered 2024-08-22: 728 mg via INTRAVENOUS
  Filled 2024-08-22: qty 25

## 2024-08-22 MED ORDER — PALONOSETRON HCL INJECTION 0.25 MG/5ML
0.2500 mg | Freq: Once | INTRAVENOUS | Status: AC
  Administered 2024-08-22: 0.25 mg via INTRAVENOUS
  Filled 2024-08-22: qty 5

## 2024-08-22 MED ORDER — SODIUM CHLORIDE 0.9 % IV SOLN
180.0000 mg/m2 | Freq: Once | INTRAVENOUS | Status: AC
  Administered 2024-08-22: 320 mg via INTRAVENOUS
  Filled 2024-08-22: qty 15

## 2024-08-22 MED ORDER — ATROPINE SULFATE 1 MG/ML IV SOLN
0.5000 mg | Freq: Once | INTRAVENOUS | Status: AC | PRN
  Administered 2024-08-22: 0.5 mg via INTRAVENOUS
  Filled 2024-08-22: qty 1

## 2024-08-22 MED ORDER — SODIUM CHLORIDE 0.9 % IV SOLN
2400.0000 mg/m2 | INTRAVENOUS | Status: DC
  Administered 2024-08-22: 4350 mg via INTRAVENOUS
  Filled 2024-08-22: qty 87

## 2024-08-22 MED ORDER — SODIUM CHLORIDE 0.9 % IV SOLN: Freq: Once | INTRAVENOUS | Status: AC

## 2024-08-22 MED ORDER — MAGNESIUM SULFATE 4 GM/100ML IV SOLN
4.0000 g | Freq: Once | INTRAVENOUS | Status: AC
  Administered 2024-08-22: 4 g via INTRAVENOUS
  Filled 2024-08-22: qty 100

## 2024-08-22 MED ORDER — DEXAMETHASONE SOD PHOSPHATE PF 10 MG/ML IJ SOLN
10.0000 mg | Freq: Once | INTRAMUSCULAR | Status: AC
  Administered 2024-08-22: 10 mg via INTRAVENOUS
  Filled 2024-08-22: qty 1

## 2024-08-22 MED ORDER — SODIUM CHLORIDE 0.9 % IV SOLN
6.0000 mg/kg | Freq: Once | INTRAVENOUS | Status: AC
  Administered 2024-08-22: 400 mg via INTRAVENOUS
  Filled 2024-08-22: qty 20

## 2024-08-22 NOTE — Patient Instructions (Signed)
 [facility] - A DEPT OF [facility]   Discharge Instructions: Thank you for choosing [facility] to provide your oncology and hematology care.   If you have a lab appointment with the Cancer Center, please go directly to the Cancer Center and check in at the registration area.   Wear comfortable clothing and clothing appropriate for easy access to any Portacath or PICC line.   We strive to give you quality time with your provider. You may need to reschedule your appointment if you arrive late (15 or more minutes).  Arriving late affects you and other patients whose appointments are after yours.  Also, if you miss three or more appointments without notifying the office, you may be dismissed from the clinic at the provider's discretion.      For prescription refill requests, have your pharmacy contact our office and allow 72 hours for refills to be completed.    Today you received the following chemotherapy and/or immunotherapy agents: Panitumumab  (Vectibix ), Irinotecan , Leucovorin , and Fluorouracil  (Adrucil )      To help prevent nausea and vomiting after your treatment, we encourage you to take your nausea medication as directed.  BELOW ARE SYMPTOMS THAT SHOULD BE REPORTED IMMEDIATELY: *FEVER GREATER THAN 100.4 F (38 C) OR HIGHER *CHILLS OR SWEATING *NAUSEA AND VOMITING THAT IS NOT CONTROLLED WITH YOUR NAUSEA MEDICATION *UNUSUAL SHORTNESS OF BREATH *UNUSUAL BRUISING OR BLEEDING *URINARY PROBLEMS (pain or burning when urinating, or frequent urination) *BOWEL PROBLEMS (unusual diarrhea, constipation, pain near the anus) TENDERNESS IN MOUTH AND THROAT WITH OR WITHOUT PRESENCE OF ULCERS (sore throat, sores in mouth, or a toothache) UNUSUAL RASH, SWELLING OR PAIN  UNUSUAL VAGINAL DISCHARGE OR ITCHING   Items with * indicate a potential emergency and should be followed up as soon as possible or go to the Emergency Department if any problems should  occur.  Please show the CHEMOTHERAPY ALERT CARD or IMMUNOTHERAPY ALERT CARD at check-in to the Emergency Department and triage nurse.  Should you have questions after your visit or need to cancel or reschedule your appointment, please contact [facility] - A DEPT OF [facility]  Dept: [phone]  and follow the prompts.  Office hours are 8:00 a.m. to 4:30 p.m. Monday - Friday. Please note that voicemails left after 4:00 p.m. may not be returned until the following business day.  We are closed weekends and major holidays. You have access to a nurse at all times for urgent questions. Please call the main number to the clinic Dept: [phone] and follow the prompts.   For any non-urgent questions, you may also contact your provider using MyChart. We now offer e-Visits for anyone 50 and older to request care online for non-urgent symptoms. For details visit [email or web address].   Also download the MyChart app! Go to the app store, search MyChart, open the app, select , and log in with your MyChart username and password.  The chemotherapy medication bag should finish at 46 hours, 96 hours, or 7 days. For example, if your pump is scheduled for 46 hours and it was put on at 4:00 p.m., it should finish at 2:00 p.m. the day it is scheduled to come off regardless of your appointment time.     Estimated time to finish at    If the display on your pump reads Low Volume and it is beeping, take the batteries out of the pump and come  to the cancer center for it to be taken off.   If the pump alarms go off prior to the pump reading Low Volume then call [phone] and someone can assist you.  If the plunger comes out and the chemotherapy medication is leaking out, please use your home chemo spill kit to clean up the spill. Do NOT use paper towels or other household products.  If you have problems or questions regarding your pump, please call either  [phone] (24 hours a day) or the cancer center Monday-Friday 8:00 a.m.- 4:30 p.m. at the clinic number and we will assist you. If you are unable to get assistance, then go to the nearest Emergency Department and ask the staff to contact the IV team for assistance.

## 2024-08-22 NOTE — Progress Notes (Signed)
 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  -He had a metastatic recurrence in the lungs and retroperitoneal adenopathy in September 2023 -he started FOLFIRI on 05/08/2022, Vectibix  added from cycle 2, tolerating well overall -restaging CT scan image from August 02, 2022 and 10/28/2022 both showed good partial response in pulmonary metastasis, no other new lesions. -we have changed his treatment to maintenance therapy with 5-fu and vectibix  in late March 2024 -He is tolerating treatment very well, will continue. -due to metastasis in both lungs and retroperitoneal lymph nodes, he is not a candidate for surgery or local therapy  -restaging CT 10/06/2023 showed stable disease overall except increased size of the pulmonary nodule in the lingula now measuring 7 mm Will continue current therapy. - Restaging CT scan on May 29th 2025 showed mild disease progression in the lung, I changed his chemotherapy back to FOLFIRI and Vectibix  on 01/03/2024 -CT 04/04/2024 showed stable disease, will continue current therapy   Assessment & Plan Metastatic colon cancer Disease remains well-controlled on current chemotherapy regimen with panitumumab  and 5-fluorouracil . Most recent CT scan in December demonstrated favorable  response. Tumor marker CA remains negative. He reports no new gastrointestinal symptoms or significant adverse effects and maintains good functional status. - Continued panitumumab  and 5-fluorouracil  at current dose. - Scheduled infusions on February 3rd, February 17th, and March 2nd. - Repeat CT scan in late February to assess disease status. - Monitor tumor marker CEA.  Hypomagnesemia Chronic, mild hypomagnesemia with most recent magnesium  level at 1.4 mg/dL. He is asymptomatic and currently receives oral magnesium  three times daily plus supplementation during infusions. Increasing oral supplementation is expected to improve levels. - Increased oral magnesium  to four times daily by adding one tablet in the morning or evening. - Continued magnesium  supplementation during infusions. - Monitor magnesium  levels.  Anemia in neoplastic disease Very mild, stable anemia on recent laboratory evaluation. He is asymptomatic and blood counts remain within acceptable range.   Plan - Patient is clinically doing well, lab reviewed, will proceed with chemo today and continue every 2 weeks - follow Up in 2 weeks  SUMMARY OF ONCOLOGIC HISTORY: Oncology History Overview Note   Cancer Staging  Cancer of left colon [facility]) Staging form: Colon and Rectum, AJCC 8th Edition - Pathologic stage from 08/08/2021: Stage IIIB (pT3, pN1a, cM0) - Signed by [name], MD on 08/26/2021    Cancer of left colon (HCC)  04/01/2020 Imaging   IMPRESSION: 1. Gallbladder decompressed bowel also partially calcified gallstones. No pericholecystic inflammation though if there is persisting clinical concern for cholecystitis right upper quadrant ultrasound could be obtained. 2. Circumferential thickening of the distal thoracic esophagus. Could reflect features of esophagitis. Correlate with clinical symptoms and consider endoscopy as clinically warranted. 3. Additional segmental thickening of the mid to distal sigmoid  with focal narrowing. No acute surrounding inflammation  or resulting obstruction. Findings are nonspecific, and could reflect sequela of prior inflammation/infection. However, recommend correlation with colonoscopy if not recently performed. 4. Mild circumferential bladder wall thickening and indentation of the bladder base by an enlarged prostate. Possibly sequela of chronic outlet obstruction though could correlate with urinalysis to exclude cystitis. 5. Aortic Atherosclerosis (ICD10-I70.0).   08/06/2021 Imaging   IMPRESSION: Sigmoid colonic perforation with small free intraperitoneal gas and infiltration of the mesenteric and omental fat in keeping with changes of peritonitis.   Long segment inflammatory stranding of the sigmoid colon in keeping with a severe infectious or inflammatory colitis. This terminates an area of irregular mural thickening, infiltrative soft tissue within the colonic mesentery, and focal dystrophic calcification. This may represent a chronic inflammatory process, however, a perforated malignancy could appear similarly. There are 2 separate points of perforation which again raise the question of an underlying malignancy.   7.1 cm gas and fluid containing pericolonic abscess within the sigmoid mesentery.   Marked inflammatory change of the terminal ileum adjacent to the pericolonic abscess with resultant small bowel obstruction. Fluid within the distal esophagus likely relates to gastroesophageal reflux the setting of vomiting.   Aortic Atherosclerosis (ICD10-I70.0).   08/08/2021 Cancer Staging   Staging form: Colon and Rectum, AJCC 8th Edition - Pathologic stage from 08/08/2021: Stage IIIB (pT3, pN1a, cM0) - Signed by [name], MD on 08/26/2021 Stage prefix: Initial diagnosis Total positive nodes: 1 Histologic grading system: 4 grade system Histologic grade (G): G2 Residual tumor (R): R0 - None   08/08/2021 Definitive Surgery   FINAL MICROSCOPIC DIAGNOSIS:    A. COLON, SIGMOID, PARTIAL COLECTOMY:  - Invasive moderately differentiated adenocarcinoma.  - Metastatic carcinoma involving one of twelve lymph nodes (1/12).  - See oncology table below.   ADDENDUM:  Mismatch Repair Protein (IHC)  SUMMARY INTERPRETATION: NORMAL    08/14/2021 Imaging   EXAM: CT ABDOMEN AND PELVIS WITH CONTRAST  IMPRESSION: 1. Post recent sigmoid colectomy with left lower quadrant colostomy. Two small residual foci of air within the pelvic mesentery with mild adjacent thickening, but no abscess or drainable collection. Trace non organized free fluid and stranding in the pelvis. 2. Short segment of small bowel wall thickening and inflammation in the pelvis involving the distal ileum, likely reactive. 3. Dilated distal esophagus, stomach, and small bowel, without discrete transition point, favoring postoperative ileus. 4. Small bilateral pleural effusions and compressive atelectasis. 5. Heterogeneous partially enhancing 14 mm lymph node in the retroperitoneum at the aortoiliac bifurcation, not significantly changed from prior exam. Suspected additional lymph nodes in the anterior common iliac space, not significantly changed from prior exam. Recommend attention at follow-up. 6. Additional chronic findings as described.     08/15/2021 Imaging   EXAM: CT CHEST WITH CONTRAST  IMPRESSION: 1. No evidence of thoracic metastasis. 2. Bilateral small layering pleural effusions with passive atelectasis   08/26/2021 Initial Diagnosis   Cancer of left colon (HCC)   10/10/2021 - 12/12/2021 Chemotherapy   Patient is on Treatment Plan : COLORECTAL Xelox ([place]apeox) q21d     05/28/2022 - 05/28/2022 Chemotherapy   Patient is on Treatment Plan : COLORECTAL FOLFIRI + Bevacizumab q14d     05/28/2022 -  Chemotherapy   Patient is on Treatment Plan : COLORECTAL FOLFIRI + Panitumumab  q14d     07/31/2022 Imaging    IMPRESSION: Decreased bilateral pulmonary metastases.   Stable  mild abdominal lymphadenopathy.   No new or progressive metastatic disease within the chest, abdomen, or pelvis.   01/18/2023  Imaging    IMPRESSION: 1. Status post Hetty pouch sigmoid colon resection with left lower quadrant end colostomy and rectal stump. Unchanged, mild wall thickening of the decompressed distal colon at the ileostomy, as well as the rectal stump. 2. Unchanged small, treated metastatic pulmonary nodules. No new nodules. 3. Unchanged enlarged, coarsely calcified treated lower aortocaval metastatic lymph nodes. No new lymphadenopathy. 4. Coronary artery disease. 5. Cholelithiasis.   04/23/2023 Imaging   CT chest, abdomen, and pelvis with contrast  IMPRESSION: 1. Left lower lobe segmental pulmonary emboli, incidental finding. No evidence for right heart strain. 2. Stable trace bilateral pleural effusions. 3. Stable enlarged lower left paraesophageal lymph node. 4. Stable calcified retroperitoneal lymph nodes. No new enlarged lymph nodes in the chest, abdomen or pelvis. 5. Stable subcentimeter soft tissue nodules within the mesenteric fat along the inferior ostomy. 6. Left Bosniak I benign renal cyst measuring 4.6 cm. No follow-up imaging is recommended. JACR 2018 Feb; 264-273, Management of the Incidental Renal Mass on CT, [employer] 2021; 814-848, Bosniak Classification of Cystic Renal Masses, Version 2019.   07/15/2023 Imaging   CT chest abdomen and pelvis with contrast IMPRESSION: 1. Prior partial left hemicolectomy with end colostomy in the left anterior abdominal wall. Unchanged mild wall thickening of the decompressed distal colon at the ileostomy as well as the rectal stump/Hartmann's pouch. 2. Stable enlarged left lower paraesophageal lymph node. 3. Increased size of the treated pulmonary nodule in the lingula now measuring 6 mm previously 4 mm with increased in size favored artifactual given motion degrading examination of the lung bases. Suggest  attention on follow-up imaging 4. Unchanged size of the treated pulmonary nodule in the right middle lobe measuring 4 mm. 5. Increased size of prominent bilateral axillary lymph nodes measuring up to 7 mm in short axis, nonspecific. Suggest attention on follow-up imaging 6. No convincing evidence of new or progressive disease in the chest, abdomen or pelvis. 7.  Aortic Atherosclerosis (ICD10-I70.0).     10/06/2023 Imaging   CT chest abdomen pelvis with contrast IMPRESSION: 1. Increased size of the pulmonary nodule in the lingula now measuring 7 mm, concerning for pulmonary metastatic disease recurrence. 2. Additional bilateral pulmonary nodules are stable. 3. Stable prominent bilateral axillary, low paraesophageal and retroperitoneal lymph nodes. 4. Prior partial left hemicolectomy with Hartmann's pouch formation and left anterior abdominal wall colostomy. No suspicious nodularity along the Hartmann's suture line. 5. Patulous esophagus with symmetric distal esophageal wall thickening, similar prior. 6. Trace right pleural effusion. 7.  Aortic Atherosclerosis (ICD10-I70.0).     12/30/2023 Imaging   CT chest, abdomen, and pelvis with contrast IMPRESSION: 1. Increased size of bilateral pulmonary nodules with new 4 mm left upper lobe and 5 mm superior segment right lower lobe pulmonary nodules, compatible with worsening pulmonary metastatic disease. 2. Stable prominent bilateral axillary and mediastinal lymph nodes. 3. Stable coarsely calcified retroperitoneal lymph nodes measuring up to 10 mm in short axis. No new pathologically enlarged abdominal or pelvic lymph nodes. 4. Stable trace right pleural effusion. 5. Prior partial left hemicolectomy with Hartmann's pouch formation and left anterior abdominal wall colostomy. No new suspicious nodularity along the suture line.      Discussed the use of AI scribe software for clinical note transcription with the patient, who gave verbal consent to  proceed.  History of Present Illness Matthew [name] is a 59 year old male with metastatic colon cancer who presents for routine oncology follow-up.  He is receiving panitumumab  and 5-fluorouracil  with oral and infusion magnesium . He  tolerates treatment well without gastrointestinal symptoms, abdominal pain, bowel changes, appetite loss, fatigue, or malaise. He adheres to magnesium  three times daily, potassium once daily, Eliquis , and iron, and has an adequate magnesium  supply at home.  Imaging in December 2025 showed no concerning findings, and the tumor marker CA was negative. Labs show very mild anemia with stable kidney and liver function. Magnesium  level today is 1.4.  He has intermittent nasal symptoms with altered smell and discharge controlled with as-needed Flonase. He reports no other new symptoms. He is accompanied by a family member who helps with medication management.     All other systems were reviewed with the patient and are negative.  MEDICAL HISTORY:  Past Medical History:  Diagnosis Date   Cancer (HCC)    [facility])     SURGICAL HISTORY: Past Surgical History:  Procedure Laterality Date   BRONCHIAL BIOPSY  05/11/2022   Procedure: BRONCHIAL BIOPSIES;  Surgeon: Shelah Lamar RAMAN, MD;  Location: [facility] ENDOSCOPY;  Service: Pulmonary;;   BRONCHIAL BRUSHINGS  05/11/2022   Procedure: BRONCHIAL BRUSHINGS;  Surgeon: Shelah Lamar RAMAN, MD;  Location: [facility] ENDOSCOPY;  Service: Pulmonary;;   BRONCHIAL NEEDLE ASPIRATION BIOPSY  05/11/2022   Procedure: BRONCHIAL NEEDLE ASPIRATION BIOPSIES;  Surgeon: Shelah Lamar RAMAN, MD;  Location: [facility] ENDOSCOPY;  Service: Pulmonary;;   BRONCHIAL WASHINGS  05/11/2022   Procedure: BRONCHIAL WASHINGS;  Surgeon: Shelah Lamar RAMAN, MD;  Location: [facility] ENDOSCOPY;  Service: Pulmonary;;   IR IMAGING GUIDED PORT INSERTION  05/25/2022   LAPAROTOMY N/A 08/08/2021   Procedure: EXPLORATORY LAPAROTOMY;  Surgeon: [name], MD;  Location: [facility] OR;  Service:  General;  Laterality: N/A;   PARTIAL COLECTOMY N/A 08/08/2021   Procedure: PARTIAL COLECTOMY WITH COLOSTOMY;  Surgeon: [name], MD;  Location: [facility] OR;  Service: General;  Laterality: N/A;   VIDEO BRONCHOSCOPY WITH RADIAL ENDOBRONCHIAL ULTRASOUND  05/11/2022   Procedure: VIDEO BRONCHOSCOPY WITH RADIAL ENDOBRONCHIAL ULTRASOUND;  Surgeon: Shelah Lamar RAMAN, MD;  Location: [facility] ENDOSCOPY;  Service: Pulmonary;;    I have reviewed the social history and family history with the patient and they are unchanged from previous note.  ALLERGIES:  has no known allergies.  MEDICATIONS:  Current Outpatient Medications  Medication Sig Dispense Refill   acetaminophen  (TYLENOL ) 500 MG tablet Take 2 tablets (1,000 mg total) by mouth every 6 (six) hours as needed for mild pain or moderate pain.  0   apixaban  (ELIQUIS ) 5 MG TABS tablet Take 1 tablet (5 mg total) by mouth 2 (two) times daily. 60 tablet 2   aspirin EC 81 MG tablet Take 81 mg by mouth daily as needed (for pain or headaches).      benztropine  (COGENTIN ) 1 MG tablet Take 1 mg at bedtime by mouth.     clindamycin  (CLINDAGEL) 1 % gel Apply topically 2 (two) times daily. To skin rash on face and upper body 30 g 0   ferrous sulfate  325 (65 FE) MG EC tablet Take 1 tablet (325 mg total) by mouth daily. 30 tablet 3   hydrocortisone  cream 1 % Apply 1 Application topically 2 (two) times daily as needed for itching. For rash 30 g 2   loperamide  (IMODIUM ) 2 MG capsule Take 1 capsule (2 mg total) by mouth as needed for diarrhea or loose stools. 30 capsule 0   magnesium  oxide (MAG-OX) 400 (240 Mg) MG tablet Take 1 tablet (400 mg total) by mouth 2 (two) times daily. 30 tablet 2   Multiple Vitamin (MULTIVITAMIN WITH  MINERALS) TABS tablet Take 1 tablet by mouth daily.     ondansetron  (ZOFRAN ) 8 MG tablet Take 1 tablet (8 mg total) by mouth every 8 (eight) hours as needed for nausea or vomiting. 30 tablet 0   paliperidone  (INVEGA  SUSTENNA) 156 MG/ML SUSP injection  Inject 156 mg every 30 (thirty) days into the muscle.     polyethylene glycol (MIRALAX  / GLYCOLAX ) 17 g packet Take 17 g by mouth daily as needed for mild constipation or moderate constipation.  0   potassium chloride  SA (KLOR-CON  M) 20 MEQ tablet Take 1 tablet (20 mEq total) by mouth 2 (two) times daily. 60 tablet 2   prochlorperazine  (COMPAZINE ) 10 MG tablet TAKE 1 TABLET(10 MG) BY MOUTH EVERY 6 HOURS AS NEEDED FOR NAUSEA OR VOMITING 30 tablet 0   No current facility-administered medications for this visit.    PHYSICAL EXAMINATION: ECOG PERFORMANCE STATUS: 1 - Symptomatic but completely ambulatory  Vitals:   08/22/24 0950  Pulse: 95  Temp: 98.6 F (37 C)  SpO2: 100%   Wt Readings from Last 3 Encounters:  08/22/24 147 lb 6.4 oz (66.9 kg)  08/04/24 145 lb 9.6 oz (66 kg)  07/17/24 146 lb 9.6 oz (66.5 kg)     GENERAL:alert, no distress and comfortable SKIN: skin color, texture, turgor are normal, no rashes or significant lesions EYES: normal, Conjunctiva are pink and non-injected, sclera clear NECK: supple, thyroid normal size, non-tender, without nodularity LYMPH:  no palpable lymphadenopathy in the cervical, axillary  LUNGS: clear to auscultation and percussion with normal breathing effort HEART: regular rate & rhythm and no murmurs and no lower extremity edema ABDOMEN:abdomen soft, non-tender and normal bowel sounds Musculoskeletal:no cyanosis of digits and no clubbing  NEURO: alert & oriented x 3 with fluent speech, no focal motor/sensory deficits  Physical Exam    LABORATORY DATA:  I have reviewed the data as listed    Latest Ref Rng & Units 08/22/2024    9:20 AM 08/04/2024    8:54 AM 07/17/2024    9:38 AM  CBC  WBC 4.0 - 10.5 K/uL 5.5  4.0  4.0   Hemoglobin 13.0 - 17.0 g/dL 89.1  88.9  89.4   Hematocrit 39.0 - 52.0 % 33.1  33.9  32.7   Platelets 150 - 400 K/uL 349  410  345         Latest Ref Rng & Units 08/22/2024    9:20 AM 08/04/2024    8:54 AM 07/17/2024     9:38 AM  CMP  Glucose 70 - 99 mg/dL 897  888  95   BUN 6 - 20 mg/dL 13  9  16    Creatinine 0.61 - 1.24 mg/dL 9.25  9.37  9.38   Sodium 135 - 145 mmol/L 140  141  138   Potassium 3.5 - 5.1 mmol/L 3.5  3.8  3.7   Chloride 98 - 111 mmol/L 107  109  107   CO2 22 - 32 mmol/L 22  23  22    Calcium  8.9 - 10.3 mg/dL 9.1  9.0  9.1   Total Protein 6.5 - 8.1 g/dL 6.4  6.3  6.3   Total Bilirubin 0.0 - 1.2 mg/dL 0.5  0.4  0.4   Alkaline Phos 38 - 126 U/L 142  128  113   AST 15 - 41 U/L 19  22  18    ALT 0 - 44 U/L 25  17  12        RADIOGRAPHIC STUDIES:  I have personally reviewed the radiological images as listed and agreed with the findings in the report. No results found.    Orders Placed This Encounter  Procedures   CBC with Differential (Cancer Center Only)    Standing Status:   Future    Expected Date:   09/18/2024    Expiration Date:   09/18/2025   CMP (Cancer Center only)    Standing Status:   Future    Expected Date:   09/18/2024    Expiration Date:   09/19/2025   Magnesium     Standing Status:   Future    Expected Date:   09/18/2024    Expiration Date:   09/18/2025   CBC with Differential (Cancer Center Only)    Standing Status:   Future    Expected Date:   10/02/2024    Expiration Date:   10/02/2025   CMP (Cancer Center only)    Standing Status:   Future    Expected Date:   10/02/2024    Expiration Date:   10/03/2025   Magnesium     Standing Status:   Future    Expected Date:   10/02/2024    Expiration Date:   10/02/2025   All questions were answered. The patient knows to call the clinic with any problems, questions or concerns. No barriers to learning was detected. The total time spent in the appointment was 25 minutes, including review of chart and various tests results, discussions about plan of care and coordination of care plan     Onita Mattock, MD 08/22/2024     "

## 2024-08-24 ENCOUNTER — Inpatient Hospital Stay: Payer: MEDICAID

## 2024-08-24 VITALS — BP 94/65 | HR 111 | Temp 99.5°F | Resp 18

## 2024-08-24 DIAGNOSIS — C186 Malignant neoplasm of descending colon: Secondary | ICD-10-CM

## 2024-08-31 ENCOUNTER — Encounter: Payer: Self-pay | Admitting: Hematology

## 2024-08-31 NOTE — Progress Notes (Signed)
 Returned call to pt's mom regarding appointment on 09/07/24. Advised per notes it is a pump appointment. She said she needs to call and schedule transportation as she had already scheduled for 09/05/24 appointments.  She has my card for any other financial questions or concerns.

## 2024-09-03 NOTE — Assessment & Plan Note (Signed)
 stage IIIB p(T3, N1aM0), MSS, KRAS/NRAS/BRAF wild type, lung and node metastasis in 04/2022  -Initially diagnosed in 08/2021 -he completed 4 cycles adjuvant CAPOX 10/10/21 - 12/25/21 (though he somehow was still taking Xeloda  through ~02/09/22 due to misunderstandings).  -He had a metastatic recurrence in the lungs and retroperitoneal adenopathy in September 2023 -he started FOLFIRI on 05/08/2022, Vectibix  added from cycle 2, tolerating well overall -restaging CT scan image from August 02, 2022 and 10/28/2022 both showed good partial response in pulmonary metastasis, no other new lesions. -we have changed his treatment to maintenance therapy with 5-fu and vectibix  in late March 2024 -He is tolerating treatment very well, will continue. -due to metastasis in both lungs and retroperitoneal lymph nodes, he is not a candidate for surgery or local therapy  -restaging CT 10/06/2023 showed stable disease overall except increased size of the pulmonary nodule in the lingula now measuring 7 mm Will continue current therapy. - Restaging CT scan on May 29th 2025 showed mild disease progression in the lung, his chemotherapy  was changed back to FOLFIRI and Vectibix  on 01/03/2024 -CT 04/04/2024 showed stable disease, will continue current therapy  -Most recent CT scan in 07/2024 demonstrated favorable response. Tumor marker CEA remains negative. He reports no new gastrointestinal symptoms or significant adverse effects and maintains good functional status. - Continued FOLFIRI and Vectibix  at current doses - Repeat CT scan in late February to assess disease status. Ordered during visit 09/05/2024.  - Monitor tumor marker CEA. Drawn during visit 09/05/2024.

## 2024-09-04 ENCOUNTER — Other Ambulatory Visit: Payer: Self-pay | Admitting: Nurse Practitioner

## 2024-09-04 DIAGNOSIS — C186 Malignant neoplasm of descending colon: Secondary | ICD-10-CM

## 2024-09-05 ENCOUNTER — Encounter: Payer: Self-pay | Admitting: Nurse Practitioner

## 2024-09-05 ENCOUNTER — Encounter: Payer: Self-pay | Admitting: Hematology

## 2024-09-05 ENCOUNTER — Inpatient Hospital Stay: Payer: MEDICAID | Attending: Hematology | Admitting: Nurse Practitioner

## 2024-09-05 ENCOUNTER — Inpatient Hospital Stay: Payer: MEDICAID

## 2024-09-05 ENCOUNTER — Inpatient Hospital Stay: Payer: MEDICAID | Attending: Hematology

## 2024-09-05 VITALS — BP 102/71 | HR 97 | Resp 17 | Wt 149.6 lb

## 2024-09-05 VITALS — BP 102/71 | HR 98 | Temp 97.5°F | Resp 16

## 2024-09-05 DIAGNOSIS — C186 Malignant neoplasm of descending colon: Secondary | ICD-10-CM

## 2024-09-05 LAB — CMP (CANCER CENTER ONLY)
ALT: 31 U/L (ref 0–44)
AST: 27 U/L (ref 15–41)
Albumin: 3.8 g/dL (ref 3.5–5.0)
Alkaline Phosphatase: 140 U/L — ABNORMAL HIGH (ref 38–126)
Anion gap: 11 (ref 5–15)
BUN: 14 mg/dL (ref 6–20)
CO2: 22 mmol/L (ref 22–32)
Calcium: 8.8 mg/dL — ABNORMAL LOW (ref 8.9–10.3)
Chloride: 105 mmol/L (ref 98–111)
Creatinine: 0.64 mg/dL (ref 0.61–1.24)
GFR, Estimated: >60 mL/min
Glucose, Bld: 115 mg/dL — ABNORMAL HIGH (ref 70–99)
Potassium: 3.5 mmol/L (ref 3.5–5.1)
Sodium: 138 mmol/L (ref 135–145)
Total Bilirubin: 0.3 mg/dL (ref 0.0–1.2)
Total Protein: 6.2 g/dL — ABNORMAL LOW (ref 6.5–8.1)

## 2024-09-05 LAB — CBC WITH DIFFERENTIAL (CANCER CENTER ONLY)
Abs Immature Granulocytes: 0.01 10*3/uL (ref 0.00–0.07)
Basophils Absolute: 0 10*3/uL (ref 0.0–0.1)
Basophils Relative: 1 %
Eosinophils Absolute: 0.2 10*3/uL (ref 0.0–0.5)
Eosinophils Relative: 5 %
HCT: 31.9 % — ABNORMAL LOW (ref 39.0–52.0)
Hemoglobin: 10 g/dL — ABNORMAL LOW (ref 13.0–17.0)
Immature Granulocytes: 0 %
Lymphocytes Relative: 24 %
Lymphs Abs: 0.9 10*3/uL (ref 0.7–4.0)
MCH: 24.5 pg — ABNORMAL LOW (ref 26.0–34.0)
MCHC: 31.3 g/dL (ref 30.0–36.0)
MCV: 78.2 fL — ABNORMAL LOW (ref 80.0–100.0)
Monocytes Absolute: 0.3 10*3/uL (ref 0.1–1.0)
Monocytes Relative: 9 %
Neutro Abs: 2.2 10*3/uL (ref 1.7–7.7)
Neutrophils Relative %: 61 %
Platelet Count: 342 10*3/uL (ref 150–400)
RBC: 4.08 MIL/uL — ABNORMAL LOW (ref 4.22–5.81)
RDW: 18.4 % — ABNORMAL HIGH (ref 11.5–15.5)
WBC Count: 3.6 10*3/uL — ABNORMAL LOW (ref 4.0–10.5)
nRBC: 0 % (ref 0.0–0.2)

## 2024-09-05 LAB — MAGNESIUM: Magnesium: 1.1 mg/dL — ABNORMAL LOW (ref 1.7–2.4)

## 2024-09-05 LAB — CEA (ACCESS): CEA (CHCC): <1 ng/mL (ref 0.00–5.00)

## 2024-09-05 MED ORDER — MAGNESIUM SULFATE 4 GM/100ML IV SOLN
4.0000 g | Freq: Once | INTRAVENOUS | Status: AC
  Administered 2024-09-05: 4 g via INTRAVENOUS
  Filled 2024-09-05: qty 100

## 2024-09-05 MED ORDER — SODIUM CHLORIDE 0.9 % IV SOLN: Freq: Once | INTRAVENOUS | Status: AC

## 2024-09-05 MED ORDER — DEXAMETHASONE SOD PHOSPHATE PF 10 MG/ML IJ SOLN
10.0000 mg | Freq: Once | INTRAMUSCULAR | Status: AC
  Administered 2024-09-05: 10 mg via INTRAVENOUS
  Filled 2024-09-05: qty 1

## 2024-09-05 MED ORDER — PALONOSETRON HCL INJECTION 0.25 MG/5ML
0.2500 mg | Freq: Once | INTRAVENOUS | Status: AC
  Administered 2024-09-05: 0.25 mg via INTRAVENOUS
  Filled 2024-09-05: qty 5

## 2024-09-05 MED ORDER — SODIUM CHLORIDE 0.9 % IV SOLN
180.0000 mg/m2 | Freq: Once | INTRAVENOUS | Status: AC
  Administered 2024-09-05: 320 mg via INTRAVENOUS
  Filled 2024-09-05: qty 15

## 2024-09-05 MED ORDER — ATROPINE SULFATE 1 MG/ML IV SOLN
0.5000 mg | Freq: Once | INTRAVENOUS | Status: AC | PRN
  Administered 2024-09-05: 0.5 mg via INTRAVENOUS
  Filled 2024-09-05: qty 1

## 2024-09-05 MED ORDER — SODIUM CHLORIDE 0.9 % IV SOLN
2400.0000 mg/m2 | INTRAVENOUS | Status: DC
  Administered 2024-09-05: 4350 mg via INTRAVENOUS
  Filled 2024-09-05: qty 87

## 2024-09-05 MED ORDER — SODIUM CHLORIDE 0.9 % IV SOLN
6.0000 mg/kg | Freq: Once | INTRAVENOUS | Status: AC
  Administered 2024-09-05: 400 mg via INTRAVENOUS
  Filled 2024-09-05: qty 20

## 2024-09-05 MED ORDER — SODIUM CHLORIDE 0.9 % IV SOLN
400.0000 mg/m2 | Freq: Once | INTRAVENOUS | Status: AC
  Administered 2024-09-05: 728 mg via INTRAVENOUS
  Filled 2024-09-05: qty 17.5

## 2024-09-05 NOTE — Patient Instructions (Signed)
 [facility] - A DEPT OF [facility]   Discharge Instructions: Thank you for choosing [facility] to provide your oncology and hematology care.   If you have a lab appointment with the Cancer Center, please go directly to the Cancer Center and check in at the registration area.   Wear comfortable clothing and clothing appropriate for easy access to any Portacath or PICC line.   We strive to give you quality time with your provider. You may need to reschedule your appointment if you arrive late (15 or more minutes).  Arriving late affects you and other patients whose appointments are after yours.  Also, if you miss three or more appointments without notifying the office, you may be dismissed from the clinic at the provider's discretion.      For prescription refill requests, have your pharmacy contact our office and allow 72 hours for refills to be completed.    Today you received the following chemotherapy and/or immunotherapy agents: Panitumumab  (Vectibix ), Irinotecan , Leucovorin , and Fluorouracil  (Adrucil )      To help prevent nausea and vomiting after your treatment, we encourage you to take your nausea medication as directed.  BELOW ARE SYMPTOMS THAT SHOULD BE REPORTED IMMEDIATELY: *FEVER GREATER THAN 100.4 F (38 C) OR HIGHER *CHILLS OR SWEATING *NAUSEA AND VOMITING THAT IS NOT CONTROLLED WITH YOUR NAUSEA MEDICATION *UNUSUAL SHORTNESS OF BREATH *UNUSUAL BRUISING OR BLEEDING *URINARY PROBLEMS (pain or burning when urinating, or frequent urination) *BOWEL PROBLEMS (unusual diarrhea, constipation, pain near the anus) TENDERNESS IN MOUTH AND THROAT WITH OR WITHOUT PRESENCE OF ULCERS (sore throat, sores in mouth, or a toothache) UNUSUAL RASH, SWELLING OR PAIN  UNUSUAL VAGINAL DISCHARGE OR ITCHING   Items with * indicate a potential emergency and should be followed up as soon as possible or go to the Emergency Department if any problems should  occur.  Please show the CHEMOTHERAPY ALERT CARD or IMMUNOTHERAPY ALERT CARD at check-in to the Emergency Department and triage nurse.  Should you have questions after your visit or need to cancel or reschedule your appointment, please contact [facility] - A DEPT OF [facility]  Dept: [phone]  and follow the prompts.  Office hours are 8:00 a.m. to 4:30 p.m. Monday - Friday. Please note that voicemails left after 4:00 p.m. may not be returned until the following business day.  We are closed weekends and major holidays. You have access to a nurse at all times for urgent questions. Please call the main number to the clinic Dept: [phone] and follow the prompts.   For any non-urgent questions, you may also contact your provider using MyChart. We now offer e-Visits for anyone 50 and older to request care online for non-urgent symptoms. For details visit [email or web address].   Also download the MyChart app! Go to the app store, search MyChart, open the app, select , and log in with your MyChart username and password.  The chemotherapy medication bag should finish at 46 hours, 96 hours, or 7 days. For example, if your pump is scheduled for 46 hours and it was put on at 4:00 p.m., it should finish at 2:00 p.m. the day it is scheduled to come off regardless of your appointment time.     Estimated time to finish at    If the display on your pump reads Low Volume and it is beeping, take the batteries out of the pump and come  to the cancer center for it to be taken off.   If the pump alarms go off prior to the pump reading Low Volume then call [phone] and someone can assist you.  If the plunger comes out and the chemotherapy medication is leaking out, please use your home chemo spill kit to clean up the spill. Do NOT use paper towels or other household products.  If you have problems or questions regarding your pump, please call either  [phone] (24 hours a day) or the cancer center Monday-Friday 8:00 a.m.- 4:30 p.m. at the clinic number and we will assist you. If you are unable to get assistance, then go to the nearest Emergency Department and ask the staff to contact the IV team for assistance.

## 2024-09-07 ENCOUNTER — Inpatient Hospital Stay: Payer: MEDICAID

## 2024-09-15 ENCOUNTER — Ambulatory Visit (HOSPITAL_COMMUNITY): Payer: MEDICAID

## 2024-09-19 ENCOUNTER — Inpatient Hospital Stay: Payer: MEDICAID

## 2024-09-19 ENCOUNTER — Inpatient Hospital Stay: Payer: MEDICAID | Admitting: Nurse Practitioner

## 2024-09-21 ENCOUNTER — Inpatient Hospital Stay: Payer: MEDICAID

## 2024-10-02 ENCOUNTER — Inpatient Hospital Stay: Payer: MEDICAID | Admitting: Hematology

## 2024-10-02 ENCOUNTER — Inpatient Hospital Stay: Payer: MEDICAID | Attending: Hematology

## 2024-10-02 ENCOUNTER — Inpatient Hospital Stay: Payer: MEDICAID

## 2024-10-04 ENCOUNTER — Inpatient Hospital Stay: Payer: MEDICAID
